# Patient Record
Sex: Female | Born: 1950 | Race: White | Hispanic: No | Smoking: Never smoker
Health system: Southern US, Community
[De-identification: ages and names within clinical notes are randomized; demographics above are authoritative.]

## PROBLEM LIST (undated history)

## (undated) DIAGNOSIS — E559 Vitamin D deficiency, unspecified: Secondary | ICD-10-CM

## (undated) DIAGNOSIS — M1990 Unspecified osteoarthritis, unspecified site: Secondary | ICD-10-CM

## (undated) DIAGNOSIS — IMO0002 Reserved for concepts with insufficient information to code with codable children: Secondary | ICD-10-CM

## (undated) DIAGNOSIS — Z91013 Allergy to seafood: Secondary | ICD-10-CM

## (undated) DIAGNOSIS — F32A Depression, unspecified: Secondary | ICD-10-CM

## (undated) DIAGNOSIS — M25551 Pain in right hip: Secondary | ICD-10-CM

## (undated) DIAGNOSIS — G479 Sleep disorder, unspecified: Secondary | ICD-10-CM

## (undated) DIAGNOSIS — I779 Disorder of arteries and arterioles, unspecified: Secondary | ICD-10-CM

## (undated) DIAGNOSIS — I358 Other nonrheumatic aortic valve disorders: Secondary | ICD-10-CM

## (undated) DIAGNOSIS — R9430 Abnormal result of cardiovascular function study, unspecified: Secondary | ICD-10-CM

## (undated) DIAGNOSIS — E538 Deficiency of other specified B group vitamins: Secondary | ICD-10-CM

## (undated) DIAGNOSIS — H269 Unspecified cataract: Secondary | ICD-10-CM

## (undated) DIAGNOSIS — T7840XA Allergy, unspecified, initial encounter: Secondary | ICD-10-CM

## (undated) DIAGNOSIS — Z96641 Presence of right artificial hip joint: Secondary | ICD-10-CM

## (undated) DIAGNOSIS — E785 Hyperlipidemia, unspecified: Secondary | ICD-10-CM

## (undated) DIAGNOSIS — I493 Ventricular premature depolarization: Secondary | ICD-10-CM

## (undated) DIAGNOSIS — K219 Gastro-esophageal reflux disease without esophagitis: Secondary | ICD-10-CM

## (undated) DIAGNOSIS — M549 Dorsalgia, unspecified: Secondary | ICD-10-CM

## (undated) DIAGNOSIS — I2510 Atherosclerotic heart disease of native coronary artery without angina pectoris: Secondary | ICD-10-CM

## (undated) DIAGNOSIS — M2550 Pain in unspecified joint: Secondary | ICD-10-CM

## (undated) DIAGNOSIS — I10 Essential (primary) hypertension: Secondary | ICD-10-CM

## (undated) DIAGNOSIS — R238 Other skin changes: Secondary | ICD-10-CM

## (undated) DIAGNOSIS — J439 Emphysema, unspecified: Secondary | ICD-10-CM

## (undated) DIAGNOSIS — R233 Spontaneous ecchymoses: Secondary | ICD-10-CM

## (undated) DIAGNOSIS — H9190 Unspecified hearing loss, unspecified ear: Secondary | ICD-10-CM

## (undated) DIAGNOSIS — I219 Acute myocardial infarction, unspecified: Secondary | ICD-10-CM

## (undated) DIAGNOSIS — Z5189 Encounter for other specified aftercare: Secondary | ICD-10-CM

## (undated) DIAGNOSIS — M8430XA Stress fracture, unspecified site, initial encounter for fracture: Secondary | ICD-10-CM

## (undated) DIAGNOSIS — R002 Palpitations: Secondary | ICD-10-CM

## (undated) DIAGNOSIS — R011 Cardiac murmur, unspecified: Secondary | ICD-10-CM

## (undated) DIAGNOSIS — K5900 Constipation, unspecified: Secondary | ICD-10-CM

## (undated) DIAGNOSIS — I739 Peripheral vascular disease, unspecified: Secondary | ICD-10-CM

## (undated) DIAGNOSIS — F329 Major depressive disorder, single episode, unspecified: Secondary | ICD-10-CM

## (undated) HISTORY — DX: Encounter for other specified aftercare: Z51.89

## (undated) HISTORY — DX: Palpitations: R00.2

## (undated) HISTORY — DX: Unspecified hearing loss, unspecified ear: H91.90

## (undated) HISTORY — DX: Hyperlipidemia, unspecified: E78.5

## (undated) HISTORY — DX: Constipation, unspecified: K59.00

## (undated) HISTORY — DX: Spontaneous ecchymoses: R23.3

## (undated) HISTORY — DX: Abnormal result of cardiovascular function study, unspecified: R94.30

## (undated) HISTORY — DX: Depression, unspecified: F32.A

## (undated) HISTORY — PX: SKIN CANCER EXCISION: SHX779

## (undated) HISTORY — DX: Deficiency of other specified B group vitamins: E53.8

## (undated) HISTORY — PX: OTHER SURGICAL HISTORY: SHX169

## (undated) HISTORY — DX: Allergy to seafood: Z91.013

## (undated) HISTORY — DX: Ventricular premature depolarization: I49.3

## (undated) HISTORY — PX: WISDOM TOOTH EXTRACTION: SHX21

## (undated) HISTORY — DX: Allergy, unspecified, initial encounter: T78.40XA

## (undated) HISTORY — DX: Dorsalgia, unspecified: M54.9

## (undated) HISTORY — DX: Sleep disorder, unspecified: G47.9

## (undated) HISTORY — DX: Unspecified cataract: H26.9

## (undated) HISTORY — DX: Disorder of arteries and arterioles, unspecified: I77.9

## (undated) HISTORY — DX: Presence of right artificial hip joint: Z96.641

## (undated) HISTORY — DX: Vitamin D deficiency, unspecified: E55.9

## (undated) HISTORY — DX: Reserved for concepts with insufficient information to code with codable children: IMO0002

## (undated) HISTORY — DX: Stress fracture, unspecified site, initial encounter for fracture: M84.30XA

## (undated) HISTORY — DX: Atherosclerotic heart disease of native coronary artery without angina pectoris: I25.10

## (undated) HISTORY — PX: CATARACT EXTRACTION, BILATERAL: SHX1313

## (undated) HISTORY — PX: BREAST BIOPSY: SHX20

## (undated) HISTORY — DX: Other skin changes: R23.8

## (undated) HISTORY — DX: Pain in right hip: M25.551

## (undated) HISTORY — DX: Other nonrheumatic aortic valve disorders: I35.8

## (undated) HISTORY — DX: Emphysema, unspecified: J43.9

## (undated) HISTORY — DX: Major depressive disorder, single episode, unspecified: F32.9

## (undated) HISTORY — PX: CARDIAC CATHETERIZATION: SHX172

## (undated) HISTORY — PX: DIAGNOSTIC LAPAROSCOPY: SUR761

## (undated) HISTORY — DX: Peripheral vascular disease, unspecified: I73.9

## (undated) HISTORY — DX: Pain in unspecified joint: M25.50

---

## 1997-04-13 ENCOUNTER — Ambulatory Visit (HOSPITAL_COMMUNITY): Admission: RE | Admit: 1997-04-13 | Discharge: 1997-04-13 | Payer: Self-pay | Admitting: Obstetrics and Gynecology

## 1998-06-01 ENCOUNTER — Other Ambulatory Visit: Admission: RE | Admit: 1998-06-01 | Discharge: 1998-06-01 | Payer: Self-pay | Admitting: Obstetrics and Gynecology

## 1999-02-17 ENCOUNTER — Ambulatory Visit (HOSPITAL_COMMUNITY): Admission: RE | Admit: 1999-02-17 | Discharge: 1999-02-17 | Payer: Self-pay | Admitting: Obstetrics and Gynecology

## 1999-02-17 ENCOUNTER — Encounter (INDEPENDENT_AMBULATORY_CARE_PROVIDER_SITE_OTHER): Payer: Self-pay | Admitting: *Deleted

## 1999-03-06 ENCOUNTER — Encounter: Payer: Self-pay | Admitting: Obstetrics and Gynecology

## 1999-03-06 ENCOUNTER — Ambulatory Visit (HOSPITAL_COMMUNITY): Admission: RE | Admit: 1999-03-06 | Discharge: 1999-03-06 | Payer: Self-pay | Admitting: Obstetrics and Gynecology

## 2000-01-08 ENCOUNTER — Other Ambulatory Visit: Admission: RE | Admit: 2000-01-08 | Discharge: 2000-01-08 | Payer: Self-pay | Admitting: Obstetrics and Gynecology

## 2000-10-31 ENCOUNTER — Encounter: Payer: Self-pay | Admitting: Obstetrics and Gynecology

## 2000-10-31 ENCOUNTER — Ambulatory Visit (HOSPITAL_COMMUNITY): Admission: RE | Admit: 2000-10-31 | Discharge: 2000-10-31 | Payer: Self-pay | Admitting: Obstetrics and Gynecology

## 2001-01-20 ENCOUNTER — Other Ambulatory Visit: Admission: RE | Admit: 2001-01-20 | Discharge: 2001-01-20 | Payer: Self-pay | Admitting: Obstetrics and Gynecology

## 2001-02-04 ENCOUNTER — Encounter: Payer: Self-pay | Admitting: Obstetrics and Gynecology

## 2001-02-04 ENCOUNTER — Ambulatory Visit (HOSPITAL_COMMUNITY): Admission: RE | Admit: 2001-02-04 | Discharge: 2001-02-04 | Payer: Self-pay | Admitting: Obstetrics and Gynecology

## 2001-03-03 ENCOUNTER — Encounter (INDEPENDENT_AMBULATORY_CARE_PROVIDER_SITE_OTHER): Payer: Self-pay

## 2001-03-03 ENCOUNTER — Ambulatory Visit (HOSPITAL_COMMUNITY): Admission: RE | Admit: 2001-03-03 | Discharge: 2001-03-03 | Payer: Self-pay | Admitting: Gastroenterology

## 2001-03-12 ENCOUNTER — Encounter: Payer: Self-pay | Admitting: Obstetrics and Gynecology

## 2001-03-12 ENCOUNTER — Ambulatory Visit (HOSPITAL_COMMUNITY): Admission: RE | Admit: 2001-03-12 | Discharge: 2001-03-12 | Payer: Self-pay | Admitting: Obstetrics and Gynecology

## 2002-05-24 ENCOUNTER — Other Ambulatory Visit: Admission: RE | Admit: 2002-05-24 | Discharge: 2002-05-24 | Payer: Self-pay | Admitting: Obstetrics and Gynecology

## 2002-05-26 ENCOUNTER — Encounter: Payer: Self-pay | Admitting: Obstetrics and Gynecology

## 2002-05-26 ENCOUNTER — Ambulatory Visit (HOSPITAL_COMMUNITY): Admission: RE | Admit: 2002-05-26 | Discharge: 2002-05-26 | Payer: Self-pay | Admitting: Obstetrics and Gynecology

## 2003-02-19 HISTORY — PX: DILATION AND CURETTAGE OF UTERUS: SHX78

## 2003-04-27 ENCOUNTER — Ambulatory Visit (HOSPITAL_COMMUNITY): Admission: RE | Admit: 2003-04-27 | Discharge: 2003-04-27 | Payer: Self-pay | Admitting: Obstetrics and Gynecology

## 2004-05-31 ENCOUNTER — Other Ambulatory Visit: Admission: RE | Admit: 2004-05-31 | Discharge: 2004-05-31 | Payer: Self-pay | Admitting: Obstetrics and Gynecology

## 2004-08-30 ENCOUNTER — Ambulatory Visit (HOSPITAL_COMMUNITY): Admission: RE | Admit: 2004-08-30 | Discharge: 2004-08-30 | Payer: Self-pay | Admitting: Gastroenterology

## 2005-07-16 ENCOUNTER — Ambulatory Visit (HOSPITAL_COMMUNITY): Admission: RE | Admit: 2005-07-16 | Discharge: 2005-07-16 | Payer: Self-pay | Admitting: Obstetrics and Gynecology

## 2005-08-01 ENCOUNTER — Encounter (INDEPENDENT_AMBULATORY_CARE_PROVIDER_SITE_OTHER): Payer: Self-pay | Admitting: Specialist

## 2005-08-01 ENCOUNTER — Encounter: Admission: RE | Admit: 2005-08-01 | Discharge: 2005-08-01 | Payer: Self-pay | Admitting: Obstetrics and Gynecology

## 2005-08-01 HISTORY — PX: BREAST BIOPSY: SHX20

## 2005-09-12 ENCOUNTER — Other Ambulatory Visit: Admission: RE | Admit: 2005-09-12 | Discharge: 2005-09-12 | Payer: Self-pay | Admitting: Obstetrics and Gynecology

## 2005-12-27 ENCOUNTER — Ambulatory Visit: Payer: Self-pay | Admitting: Cardiology

## 2006-01-27 ENCOUNTER — Encounter: Admission: RE | Admit: 2006-01-27 | Discharge: 2006-01-27 | Payer: Self-pay | Admitting: Obstetrics and Gynecology

## 2006-01-29 ENCOUNTER — Ambulatory Visit: Payer: Self-pay

## 2006-01-29 ENCOUNTER — Encounter: Payer: Self-pay | Admitting: Internal Medicine

## 2006-02-04 ENCOUNTER — Ambulatory Visit: Payer: Self-pay | Admitting: Cardiology

## 2006-07-22 ENCOUNTER — Encounter: Admission: RE | Admit: 2006-07-22 | Discharge: 2006-07-22 | Payer: Self-pay | Admitting: Obstetrics and Gynecology

## 2007-08-25 ENCOUNTER — Ambulatory Visit (HOSPITAL_COMMUNITY): Admission: RE | Admit: 2007-08-25 | Discharge: 2007-08-25 | Payer: Self-pay | Admitting: Obstetrics and Gynecology

## 2007-11-19 HISTORY — PX: CORONARY ANGIOPLASTY WITH STENT PLACEMENT: SHX49

## 2007-11-28 ENCOUNTER — Inpatient Hospital Stay (HOSPITAL_COMMUNITY): Admission: EM | Admit: 2007-11-28 | Discharge: 2007-12-02 | Payer: Self-pay | Admitting: Emergency Medicine

## 2007-11-28 ENCOUNTER — Ambulatory Visit: Payer: Self-pay | Admitting: Internal Medicine

## 2007-12-17 ENCOUNTER — Ambulatory Visit: Payer: Self-pay | Admitting: Cardiology

## 2007-12-17 ENCOUNTER — Encounter (HOSPITAL_COMMUNITY): Admission: RE | Admit: 2007-12-17 | Discharge: 2008-02-17 | Payer: Self-pay | Admitting: Cardiology

## 2007-12-17 LAB — CONVERTED CEMR LAB
BUN: 25 mg/dL — ABNORMAL HIGH (ref 6–23)
CO2: 29 meq/L (ref 19–32)
Glucose, Bld: 92 mg/dL (ref 70–99)
Potassium: 4 meq/L (ref 3.5–5.1)
Sodium: 142 meq/L (ref 135–145)

## 2007-12-31 ENCOUNTER — Ambulatory Visit: Payer: Self-pay

## 2007-12-31 ENCOUNTER — Ambulatory Visit: Payer: Self-pay | Admitting: Cardiology

## 2008-01-07 ENCOUNTER — Ambulatory Visit: Payer: Self-pay | Admitting: Cardiology

## 2008-01-28 ENCOUNTER — Ambulatory Visit: Payer: Self-pay | Admitting: Cardiology

## 2008-02-19 ENCOUNTER — Encounter (HOSPITAL_COMMUNITY): Admission: RE | Admit: 2008-02-19 | Discharge: 2008-04-18 | Payer: Self-pay | Admitting: Cardiology

## 2008-05-06 ENCOUNTER — Encounter: Payer: Self-pay | Admitting: Cardiology

## 2008-05-06 ENCOUNTER — Ambulatory Visit: Payer: Self-pay | Admitting: Cardiology

## 2008-06-13 ENCOUNTER — Ambulatory Visit: Payer: Self-pay | Admitting: Cardiology

## 2008-06-13 LAB — CONVERTED CEMR LAB
ALT: 17 units/L (ref 0–35)
Bilirubin, Direct: 0 mg/dL (ref 0.0–0.3)
Total Bilirubin: 0.7 mg/dL (ref 0.3–1.2)
Total CHOL/HDL Ratio: 3
Triglycerides: 110 mg/dL (ref 0.0–149.0)
VLDL: 22 mg/dL (ref 0.0–40.0)

## 2008-06-27 ENCOUNTER — Encounter (INDEPENDENT_AMBULATORY_CARE_PROVIDER_SITE_OTHER): Payer: Self-pay | Admitting: *Deleted

## 2008-07-20 ENCOUNTER — Telehealth: Payer: Self-pay | Admitting: Cardiology

## 2008-08-25 ENCOUNTER — Ambulatory Visit (HOSPITAL_COMMUNITY): Admission: RE | Admit: 2008-08-25 | Discharge: 2008-08-25 | Payer: Self-pay | Admitting: Obstetrics and Gynecology

## 2008-09-28 ENCOUNTER — Telehealth: Payer: Self-pay | Admitting: Cardiology

## 2008-10-11 ENCOUNTER — Encounter (INDEPENDENT_AMBULATORY_CARE_PROVIDER_SITE_OTHER): Payer: Self-pay | Admitting: *Deleted

## 2008-12-11 ENCOUNTER — Encounter: Payer: Self-pay | Admitting: Cardiology

## 2008-12-12 ENCOUNTER — Ambulatory Visit: Payer: Self-pay | Admitting: Cardiology

## 2008-12-27 ENCOUNTER — Ambulatory Visit (HOSPITAL_COMMUNITY): Admission: RE | Admit: 2008-12-27 | Discharge: 2008-12-27 | Payer: Self-pay | Admitting: Cardiology

## 2008-12-27 ENCOUNTER — Ambulatory Visit: Payer: Self-pay

## 2008-12-27 ENCOUNTER — Ambulatory Visit: Payer: Self-pay | Admitting: Cardiology

## 2008-12-27 ENCOUNTER — Encounter: Payer: Self-pay | Admitting: Cardiology

## 2009-01-30 ENCOUNTER — Ambulatory Visit: Payer: Self-pay | Admitting: Cardiology

## 2009-01-31 ENCOUNTER — Telehealth: Payer: Self-pay | Admitting: Cardiology

## 2009-02-02 ENCOUNTER — Encounter: Payer: Self-pay | Admitting: Cardiology

## 2009-02-03 ENCOUNTER — Ambulatory Visit: Payer: Self-pay | Admitting: Cardiology

## 2009-02-14 ENCOUNTER — Ambulatory Visit: Payer: Self-pay

## 2009-02-14 ENCOUNTER — Encounter: Payer: Self-pay | Admitting: Cardiology

## 2009-02-14 ENCOUNTER — Ambulatory Visit: Payer: Self-pay | Admitting: Cardiovascular Disease

## 2009-02-14 ENCOUNTER — Ambulatory Visit (HOSPITAL_COMMUNITY): Admission: RE | Admit: 2009-02-14 | Discharge: 2009-02-14 | Payer: Self-pay | Admitting: Cardiology

## 2009-02-19 LAB — CONVERTED CEMR LAB
ALT: 19 units/L (ref 0–35)
AST: 21 units/L (ref 0–37)
Albumin: 3.9 g/dL (ref 3.5–5.2)
Alkaline Phosphatase: 59 units/L (ref 39–117)
HDL: 71.7 mg/dL (ref >39.00)
LDL Cholesterol: 110 mg/dL — ABNORMAL HIGH (ref 0–99)
Total Bilirubin: 0.8 mg/dL (ref 0.3–1.2)
Total Protein: 6.8 g/dL (ref 6.0–8.3)
VLDL: 16.8 mg/dL (ref 0.0–40.0)

## 2009-03-10 ENCOUNTER — Ambulatory Visit: Payer: Self-pay | Admitting: Cardiology

## 2009-03-10 LAB — CONVERTED CEMR LAB
BUN: 21 mg/dL (ref 6–23)
Basophils Absolute: 0 10*3/uL (ref 0.0–0.1)
Eosinophils Relative: 2.4 % (ref 0.0–5.0)
Glucose, Bld: 87 mg/dL (ref 70–99)
HCT: 38.3 % (ref 36.0–46.0)
Hemoglobin: 12.8 g/dL (ref 12.0–15.0)
Lymphocytes Relative: 36.6 % (ref 12.0–46.0)
MCHC: 33.4 g/dL (ref 30.0–36.0)
MCV: 95.5 fL (ref 78.0–100.0)
Monocytes Relative: 6.6 % (ref 3.0–12.0)
Neutro Abs: 3.6 10*3/uL (ref 1.4–7.7)
Neutrophils Relative %: 53.8 % (ref 43.0–77.0)
RDW: 12.2 % (ref 11.5–14.6)
WBC: 6.7 10*3/uL (ref 4.5–10.5)

## 2009-03-17 ENCOUNTER — Inpatient Hospital Stay (HOSPITAL_BASED_OUTPATIENT_CLINIC_OR_DEPARTMENT_OTHER): Admission: RE | Admit: 2009-03-17 | Discharge: 2009-03-17 | Payer: Self-pay | Admitting: Cardiovascular Disease

## 2009-03-17 ENCOUNTER — Ambulatory Visit: Payer: Self-pay | Admitting: Cardiovascular Disease

## 2009-04-05 ENCOUNTER — Encounter: Payer: Self-pay | Admitting: Cardiology

## 2009-04-06 ENCOUNTER — Ambulatory Visit: Payer: Self-pay | Admitting: Cardiology

## 2009-04-06 DIAGNOSIS — I6529 Occlusion and stenosis of unspecified carotid artery: Secondary | ICD-10-CM | POA: Insufficient documentation

## 2009-05-17 ENCOUNTER — Ambulatory Visit: Payer: Self-pay | Admitting: Cardiology

## 2009-05-17 ENCOUNTER — Telehealth (INDEPENDENT_AMBULATORY_CARE_PROVIDER_SITE_OTHER): Payer: Self-pay | Admitting: *Deleted

## 2009-05-18 ENCOUNTER — Encounter: Payer: Self-pay | Admitting: Cardiology

## 2009-05-18 LAB — CONVERTED CEMR LAB
ALT: 21 units/L (ref 0–35)
AST: 20 units/L (ref 0–37)
Alkaline Phosphatase: 53 units/L (ref 39–117)
Bilirubin, Direct: 0.1 mg/dL (ref 0.0–0.3)
Cholesterol: 163 mg/dL (ref 0–200)
HDL: 66.5 mg/dL (ref >39.00)
LDL Cholesterol: 76 mg/dL (ref 0–99)
Total Bilirubin: 0.5 mg/dL (ref 0.3–1.2)
Triglycerides: 101 mg/dL (ref 0.0–149.0)
VLDL: 20.2 mg/dL (ref 0.0–40.0)

## 2009-05-24 ENCOUNTER — Telehealth: Payer: Self-pay | Admitting: Cardiology

## 2009-07-13 ENCOUNTER — Ambulatory Visit: Payer: Self-pay | Admitting: Cardiology

## 2009-10-27 ENCOUNTER — Encounter: Payer: Self-pay | Admitting: Cardiology

## 2009-11-14 ENCOUNTER — Ambulatory Visit (HOSPITAL_COMMUNITY): Admission: RE | Admit: 2009-11-14 | Discharge: 2009-11-14 | Payer: Self-pay | Admitting: Obstetrics and Gynecology

## 2009-12-05 ENCOUNTER — Ambulatory Visit: Payer: Self-pay | Admitting: Cardiology

## 2009-12-15 ENCOUNTER — Encounter: Payer: Self-pay | Admitting: Cardiology

## 2009-12-15 LAB — CONVERTED CEMR LAB
AST: 22 units/L (ref 0–37)
Alkaline Phosphatase: 65 units/L (ref 39–117)
Bilirubin, Direct: 0 mg/dL (ref 0.0–0.3)
Cholesterol: 177 mg/dL (ref 0–200)
HDL: 64.5 mg/dL (ref >39.00)
Total Protein: 6.5 g/dL (ref 6.0–8.3)

## 2009-12-19 ENCOUNTER — Telehealth: Payer: Self-pay | Admitting: Cardiology

## 2009-12-19 DIAGNOSIS — M25551 Pain in right hip: Secondary | ICD-10-CM

## 2009-12-19 HISTORY — DX: Pain in right hip: M25.551

## 2009-12-25 ENCOUNTER — Ambulatory Visit: Payer: Self-pay | Admitting: Cardiology

## 2009-12-27 ENCOUNTER — Ambulatory Visit (HOSPITAL_COMMUNITY): Admission: RE | Admit: 2009-12-27 | Discharge: 2009-12-27 | Payer: Self-pay | Admitting: Orthopedic Surgery

## 2010-02-22 ENCOUNTER — Ambulatory Visit
Admission: RE | Admit: 2010-02-22 | Discharge: 2010-02-22 | Payer: Self-pay | Source: Home / Self Care | Attending: Cardiology | Admitting: Cardiology

## 2010-03-10 ENCOUNTER — Encounter: Payer: Self-pay | Admitting: Orthopedic Surgery

## 2010-03-20 NOTE — Letter (Signed)
Summary: Lipid reminder  [facility], Main Office  [address]   [place], [place] [address]   Phone: [phone]  Fax: [phone]        October 27, 2009 MRN: [id number]    Courtney [name] [address] [place], [place]  [address]    Dear Ms. Golson,  Our records indicate it is time to check your cholesterol.  Please call our office to schedule an appt for labwork.  Please remember it is a fasting lab.     Sincerely,  Meredith Staggers, RN Willa Rough, MD  This letter has been electronically signed by your physician.

## 2010-03-20 NOTE — Progress Notes (Signed)
Summary: pt would like lab results  Phone Note Call from Patient Call back at Home Phone [phone]   Caller: Patient Reason for Call: Talk to Nurse, Talk to Doctor, Lab or Test Results Summary of Call: pt would like her lab results from 3/30 Initial call taken by: Omer Jack,  May 24, 2009 1:04 PM  Follow-up for Phone Call        Phone Call Completed.  RN s/w Pt re: lipid results; advised Heather mailed her results and has not yet received it, but to call back if she has not received in about 1 week. Pt verbalizes understanding.   Follow-up by: Bernita Raisin, RN, BSN,  May 24, 2009 1:31 PM

## 2010-03-20 NOTE — Progress Notes (Signed)
Summary: Question about Metoprolol 25mg   Phone Note Call from Patient Call back at Home Phone [phone] Message from:  Patient on May 17, 2009 3:42 PM  Refills Requested: Medication #1:  METOPROLOL TARTRATE 25 MG TABS Take one-half tablet by mouth twice a day Caller: Patient Summary of Call: Pt  have question about Metoprolol 25 mg pt said that Dr.Nishan changes the prescription  Initial call taken by: Judie Grieve,  May 17, 2009 3:43 PM  Follow-up for Phone Call        gave pt refills this is a Dr. Myrtis Ser pt also tried to call pt back didnot answer seems like the number i got was a office number so i did not leave message Follow-up by: Kem Parkinson,  May 17, 2009 4:04 PM    New/Updated Medications: METOPROLOL TARTRATE 25 MG TABS (METOPROLOL TARTRATE) 1 tab by mouth two times a day Prescriptions: METOPROLOL TARTRATE 25 MG TABS (METOPROLOL TARTRATE) 1 tab by mouth two times a day  #60 x 12   Entered by:   Kem Parkinson   Authorized by:   Talitha Givens, MD, [facility]   Signed by:   Kem Parkinson on 05/17/2009   Method used:   Electronically to        [facility]* (retail)       [address]       [address] Shipping/mailing       [place], [place]  [address]       Ph: [phone]       Fax: [phone]   RxID:   [id number]

## 2010-03-20 NOTE — Progress Notes (Signed)
Summary: re pain relievers   Phone Note Call from Patient   Caller: Patient [phone] Reason for Call: Talk to Nurse Summary of Call: pt hurt her hip, what pain reliver can she take Initial call taken by: Glynda Jaeger,  December 19, 2009 11:22 AM  Follow-up for Phone Call        Left message to call back Meredith Staggers, RN  December 19, 2009 4:26 PM   Left message to call back Meredith Staggers, RN  December 20, 2009 4:03 PM   pt returning call [phone] sign  Left message to call back Meredith Staggers, RN  December 21, 2009 5:14 PM   Additional Follow-up for Phone Call Additional follow up Details #1::        pt had appt w/Dr Myrtis Ser 11/7 Meredith Staggers, RN  December 25, 2009 2:54 PM

## 2010-03-20 NOTE — Letter (Signed)
Summary: Custom - Lipid  [facility], Main Office  [address]   [place], [place] [address]   Phone: [phone]  Fax: [phone]     December 15, 2009 MRN: [id number]   Courtney [name] [address] [place], [place]  [address]   Dear Ms. Kuzel,  We have reviewed your cholesterol results.  They are as follows:     Total Cholesterol:    177 (Desirable: less than 200)       HDL  Cholesterol:     64.50  (Desirable: greater than 40 for men and 50 for women)       LDL Cholesterol:       98  (Desirable: less than 100 for low risk and less than 70 for moderate to high risk)       Triglycerides:       73.0  (Desirable: less than 150)  Our recommendations include:  LDL up slightly, continue current meds, Dr Myrtis Ser will discuss with you at your appointment on 12/25/09   Call our office at the number listed above if you have any questions.  Lowering your LDL cholesterol is important, but it is only one of a large number of "risk factors" that may indicate that you are at risk for heart disease, stroke or other complications of hardening of the arteries.  Other risk factors include:   A.  Cigarette Smoking* B.  High Blood Pressure* C.  Obesity* D.   Low HDL Cholesterol (see yours above)* E.   Diabetes Mellitus (higher risk if your is uncontrolled) F.  Family history of premature heart disease G.  Previous history of stroke or cardiovascular disease    *These are risk factors YOU HAVE CONTROL OVER.  For more information, visit .  There is now evidence that lowering the TOTAL CHOLESTEROL AND LDL CHOLESTEROL can reduce the risk of heart disease.  The American Heart Association recommends the following guidelines for the treatment of elevated cholesterol:  1.  If there is now current heart disease and less than two risk factors, TOTAL CHOLESTEROL should be less than 200 and LDL CHOLESTEROL should be less than 100. 2.  If there is current heart disease or two or more  risk factors, TOTAL CHOLESTEROL should be less than 200 and LDL CHOLESTEROL should be less than 70.  A diet low in cholesterol, saturated fat, and calories is the cornerstone of treatment for elevated cholesterol.  Cessation of smoking and exercise are also important in the management of elevated cholesterol and preventing vascular disease.  Studies have shown that 30 to 60 minutes of physical activity most days can help lower blood pressure, lower cholesterol, and keep your weight at a healthy level.  Drug therapy is used when cholesterol levels do not respond to therapeutic lifestyle changes (smoking cessation, diet, and exercise) and remains unacceptably high.  If medication is started, it is important to have you levels checked periodically to evaluate the need for further treatment options.  Thank you,    [facility] Team

## 2010-03-20 NOTE — Assessment & Plan Note (Signed)
Summary: per check out/sf   Visit Type:  Follow-up Primary Provider:  [name], MD  CC:  CAD.  History of Present Illness: The patient is seen for followup of coronary artery disease.  I saw her last February 03, 2009.  She had some palpitations.  She was not having the indigestion that she had had originally with her acute ischemic syndrome.  Stress echo was done to be sure that her PVCs were not a sign of stress induced ectopy.  Study was done February 14, 2009.  Patient exercised to a peak heart rate of 150.  She did not have chest pain.  There was 1 mm of ST depression with stress..  Ejection fraction at rest was 55% with no wall motion abnormalities.  With stress there was some inferior hypokinesis.  I reviewed this data and certainly agree.  This is a subtle finding but it appears to be real.  She returns today for followup.  I also had heard a carotid bruit and Dopplers were ordered.  This study shows that there has been some progression with 40-59% bilateral stenoses.  Current Medications (verified): 1)  Aspirin Ec 325 Mg Tbec (Aspirin) .... Take One Tablet By Mouth Daily 2)  Metoprolol Tartrate 25 Mg Tabs (Metoprolol Tartrate) .... Take One-Half Tablet By Mouth Twice A Day 3)  Zocor 80 Mg Tabs (Simvastatin) .... 1/2 Tab At Bedtime 4)  Nitroglycerin 0.4 Mg Subl (Nitroglycerin) .... One Tablet Under Tongue Every 5 Minutes As Needed For Chest Pain---May Repeat Times Three 5)  Prometrium 100 Mg Caps (Progesterone Micronized) .... Once Daily 6)  Climara 0.05 Mg/24hr Ptwk (Estradiol) .... Q Week 7)  Acetaminophen 500 Mg Tabs (Acetaminophen) .... 2 Prn 8)  Ramipril 5 Mg Caps (Ramipril) .[name].. 1 Tab Once Daily 9)  Vitamin D 1000 Unit  Tabs (Cholecalciferol) .... Once Daily  Allergies (verified): 1)  ! Penicillin 2)  ! Erythromycin  Past History:  Past Medical History: Last updated: 02/03/2009  Allergy to PENICILLIN, E-MYCIN, and SHELLFISH. PVCs.... no significant  arrhythmias EF..  45%.  /  echo..12/27/2008.Marland KitchenMarland KitchenEF  55-60% Aortic valve sclerosis....echo...12/2008....soft systolic murmur. volume overload.... mild.... none since hospital in the past dyslipidemia CAD.......PCI.mid right coronary bare-metal stent.Marland KitchenMarland KitchenOctober, 2009.Marland Kitchenaspirin Plavix for one year...EF 45% then..improved 2010        Review of Systems       Patient denies fever, chills, headache, sweats, rash, change in vision, change in hearing, chest pain, cough, nausea vomiting, urinary symptoms, musculoskeletal problems.  All other systems are reviewed and are negative.  Vital Signs:  Patient profile:   60 year old female Height:      60 inches Weight:      156 pounds BMI:     30.58 Pulse rate:   60 / minute BP sitting:   126 / 78  (left arm) Cuff size:   regular  Vitals Entered By: Hardin Negus, RMA (March 10, 2009 8:43 AM)  Physical Exam  General:  patient is stable today. Head:  head is atraumatic. Eyes:  no xanthelasma. Neck:  soft carotid bruits. Chest Wall:  no chest wall tenderness. Lungs:  lungs are clear.  Respiratory effort is nonlabored. Heart:  cardiac exam reveals muscle and S2.  There are no clicks.  There is a very soft systolic murmur. Abdomen:  abdomen is soft. Msk:  no musculoskeletal deformities. Extremities:  no peripheral edema. Skin:  no skin rashes. Psych:  patient is oriented to person time and place.  Affect is normal.  Impression & Recommendations:  Problem # 1:  CAROTID BRUIT (ICD-785.9) The patient has had followup carotid Dopplers.  There is some progression and she is being treated aggressively.  She does not need an intervention.  Problem # 2:  CAD (ICD-414.00)  The stress echo reveals 1 mm of ST depression and some stress-induced inferior ischemia.  She and I discussed this carefully and we will proceed with outpatient followup cardiac catheterization.  She understands the risks and benefits.  As we done by Dr.Cooper who did her  original catheterization.  Orders: TLB-BMP (Basic Metabolic Panel-BMET) (80048-METABOL) TLB-CBC Platelet - w/Differential (85025-CBCD) TLB-PT (Protime) (85610-PTP) Cardiac Catheterization (Cardiac Cath)  Problem # 3:  FLUID OVERLOAD (ICD-276.6) Fluid status is stable.  No change in therapy.  Problem # 4:  AORTIC VALVE SCLEROSIS (ICD-424.1) The patient soft murmur is from mild aortic valve sclerosis.  The stress echo images do not suggest any major change.  Problem # 5:  * EJECTION FRACTION 45% ejection fraction at rest is normal.  Problem # 6:  PREMATURE VENTRICULAR CONTRACTIONS (ICD-427.69) The patient did not have any marked ectopy with her exercise test.  Problem # 7:  DYSLIPIDEMIA (ICD-272.4)  Her updated medication list for this problem includes:    Zocor 80 Mg Tabs (Simvastatin) .[name]... 1/2 tab at bedtime Lipids are being treated.  We'll plan followup labs later.  Patient Instructions: 1)  Labs today 2)  Your physician has requested that you have a cardiac catheterization.  Cardiac catheterization is used to diagnose and/or treat various heart conditions. Doctors may recommend this procedure for a number of different reasons. The most common reason is to evaluate chest pain. Chest pain can be a symptom of coronary artery disease (CAD), and cardiac catheterization can show whether plaque is narrowing or blocking your heart's arteries. This procedure is also used to evaluate the valves, as well as measure the blood flow and oxygen levels in different parts of your heart.  For further information please visit [email or web address]  Please follow instruction sheet, as given. 3)  Follow up in 4-5 weeks

## 2010-03-20 NOTE — Letter (Signed)
Summary: Custom - Lipid  [facility], Main Office  [address]   [place], [place] [address]   Phone: [phone]  Fax: [phone]     May 18, 2009 MRN: [id number]   Courtney [name] [address] [place], [place]  [address]   Dear Courtney [name],  We have reviewed your cholesterol results.  They are as follows:     Total Cholesterol:    163 (Desirable: less than 200)       HDL  Cholesterol:     66.50  (Desirable: greater than 40 for men and 50 for women)       LDL Cholesterol:       76  (Desirable: less than 100 for low risk and less than 70 for moderate to high risk)       Triglycerides:       101.0  (Desirable: less than 150)  Our recommendations include:  Looks Good, continue current medications   Call our office at the number listed above if you have any questions.  Lowering your LDL cholesterol is important, but it is only one of a large number of "risk factors" that may indicate that you are at risk for heart disease, stroke or other complications of hardening of the arteries.  Other risk factors include:   A.  Cigarette Smoking* B.  High Blood Pressure* C.  Obesity* D.   Low HDL Cholesterol (see yours above)* E.   Diabetes Mellitus (higher risk if your is uncontrolled) F.  Family history of premature heart disease G.  Previous history of stroke or cardiovascular disease    *These are risk factors YOU HAVE CONTROL OVER.  For more information, visit .  There is now evidence that lowering the TOTAL CHOLESTEROL AND LDL CHOLESTEROL can reduce the risk of heart disease.  The American Heart Association recommends the following guidelines for the treatment of elevated cholesterol:  1.  If there is now current heart disease and less than two risk factors, TOTAL CHOLESTEROL should be less than 200 and LDL CHOLESTEROL should be less than 100. 2.  If there is current heart disease or two or more risk factors, TOTAL CHOLESTEROL should be less than 200 and LDL  CHOLESTEROL should be less than 70.  A diet low in cholesterol, saturated fat, and calories is the cornerstone of treatment for elevated cholesterol.  Cessation of smoking and exercise are also important in the management of elevated cholesterol and preventing vascular disease.  Studies have shown that 30 to 60 minutes of physical activity most days can help lower blood pressure, lower cholesterol, and keep your weight at a healthy level.  Drug therapy is used when cholesterol levels do not respond to therapeutic lifestyle changes (smoking cessation, diet, and exercise) and remains unacceptably high.  If medication is started, it is important to have you levels checked periodically to evaluate the need for further treatment options.  Thank you,    [facility] [employer] Team

## 2010-03-20 NOTE — Assessment & Plan Note (Signed)
Summary: 4-5 week f/u cath   Visit Type:  Follow-up Primary Provider:  [name], MD  CC:  CAD.  History of Present Illness: Patient is seen for follow up of coronary disease.  When I saw her last she had some indigestion and reflux this might be her anginal equivalent.  She underwent exercise testing.  She had a stress echo.  She had 1 mm ST depression.  Ejection fraction at rest was 55%.  With stress there was subtle inferior hypokinesis.  Based on this catheterization was done.  Study was done by Dr. Excell Seltzer.  Her right coronary artery stent was patent.  She does have severe but stable stenosis of the distal posterior lateral branch of the right coronary artery.  This supplies a relatively small amount of muscle.  Intervention could be done but it was recommended that we follow medical therapy first.  The patient has continued to be active with dancing.  She is not having exertionally related indigestion.  Current Medications (verified): 1)  Aspirin Ec 325 Mg Tbec (Aspirin) .... Take One Tablet By Mouth Daily 2)  Metoprolol Tartrate 25 Mg Tabs (Metoprolol Tartrate) .... Take One-Half Tablet By Mouth Twice A Day 3)  Zocor 80 Mg Tabs (Simvastatin) .... 1/2 Tab At Bedtime 4)  Nitroglycerin 0.4 Mg Subl (Nitroglycerin) .... One Tablet Under Tongue Every 5 Minutes As Needed For Chest Pain---May Repeat Times Three 5)  Prometrium 100 Mg Caps (Progesterone Micronized) .... Once Daily 6)  Climara 0.05 Mg/24hr Ptwk (Estradiol) .... Q Week 7)  Acetaminophen 500 Mg Tabs (Acetaminophen) .... 2 Prn 8)  Ramipril 5 Mg Caps (Ramipril) .[name].. 1 Tab Once Daily 9)  Vitamin D 1000 Unit  Tabs (Cholecalciferol) .... Once Daily 10)  Co Q-10 30 Mg  Caps (Coenzyme Q10) .... 100mg  Once Daily 11)  Fish Oil   Oil (Fish Oil) .... 1000mg  Once Daily 12)  Calcium Carbonate-Vitamin D 600-400 Mg-Unit  Tabs (Calcium Carbonate-Vitamin D) .... 5 Once Daily With Magnesium 13)  Supreme Dophilus .Marland KitchenMarland Kitchen. 1 Cap Once  Daily  Allergies (verified): 1)  ! Penicillin 2)  ! Erythromycin  Past History:  Past Medical History: carotid artery disease...40-59% bilateral... Doppler December, 2010 Allergy to PENICILLIN, E-MYCIN, and SHELLFISH. PVCs.... no significant arrhythmias EF..  45%.  /  echo..12/27/2008.Marland KitchenMarland KitchenEF  55-60% Aortic valve sclerosis....echo...12/2008....soft systolic murmur. volume overload.... mild.... none since hospital in the past dyslipidemia CAD.......PCI.mid right coronary bare-metal stent.Marland KitchenMarland KitchenOctober, 2009  /  stress echo.. January, 2011... subtle inferior hypokinesis with stress  /    catheterization March 17, 2009... RCA stent patent... severe, but stable stenosis of the distal posterior lateral branch RCA...LV normal... medical therapy unless more symptoms        Review of Systems       Patient denies fever, chills, headache, sweats, rash, change in vision, change in hearing, chest pain, cough, nausea or vomiting, urinary symptoms.  All other systems are reviewed and are negative.  Vital Signs:  Patient profile:   60 year old female Height:      60 inches Weight:      156 pounds BMI:     30.58 Pulse rate:   75 / minute BP sitting:   118 / 64  (left arm) Cuff size:   regular  Vitals Entered By: Hardin Negus, RMA (April 06, 2009 8:46 AM)  Physical Exam  General:  patient is stable in general. Head:  head is atraumatic. Eyes:  no xanthelasma. Neck:  no jugular venous distention. Chest Wall:  no chest wall tenderness. Lungs:  lungs are clear respiratory effort is not labored. Heart:  cardiac exam reveals S1-S2.  No clicks or significant murmurs. Abdomen:  abdomen is soft. Msk:  no musculoskeletal deformities. Extremities:  no peripheral edema. Skin:  no skin rashes. Psych:  patient is oriented to person time and place.  Affect is normal.   Impression & Recommendations:  Problem # 1:  OVERWEIGHT (ICD-278.02) I had a careful discussion with the patient about her  weight.  We both agreed it would be good for her to get back to [employer] to lose weight.  Problem # 2:  CAROTID ARTERY DISEASE (ICD-433.10)  Her updated medication list for this problem includes:    Aspirin Ec 325 Mg Tbec (Aspirin) .[name]... Take one tablet by mouth daily The patient has moderate carotid artery disease.  There may have been some progression in the past year.  We need to be much more aggressive with her lipid treatment.  Problem # 3:  CAD (ICD-414.00) See discussion in the history of present illness.  We will increase her Lopressor from 12.5 b.i.d. to 25 b.i.d.  Initially a small dose had been used because she had some bradycardia when in the hospital.  Heart rate today is 75.  The patient did have some inferior ischemia with her stress echo.  Beta blocker dose will be increased.  If we have any difficulties with hypotension, retrocrural dose will be decreased.  I'll plan to see her back in 3 months.  The plan is for medical therapy.  However, because she had some proven ischemia with her stress echo, I will have a low threshold for recommending interventional.  Problem # 4:  DYSLIPIDEMIA (ICD-272.4)  Her updated medication list for this problem includes:    Crestor 20 Mg Tabs (Rosuvastatin calcium) .[name]... Take one tablet by mouth daily. The patient's LDL has been in the 100 range.  We need to change her now to a more affective agent and she'll be changed to Crestor 20 mg.  If she tolerates this and her labs are stable I would probably consider pushing her 40 mg  Patient Instructions: 1)  Your physician recommends that you return for a FASTING lipid and liver profile in 6 weeks 2)  Follow up in 3 months Prescriptions: RAMIPRIL 5 MG CAPS (RAMIPRIL) 1 tab once daily  #90 x 3   Entered by:   Meredith Staggers, RN   Authorized by:   Talitha Givens, MD, [facility]   Signed by:   Meredith Staggers, RN on 04/06/2009   Method used:   Electronically to        [facility]*  (retail)       [address]       [address] Shipping/mailing       [place], [place]  [address]       Ph: [phone]       Fax: [phone]   RxID:   [id number] CRESTOR 20 MG TABS (ROSUVASTATIN CALCIUM) Take one tablet by mouth daily.  #90 x 3   Entered by:   Meredith Staggers, RN   Authorized by:   Talitha Givens, MD, [facility]   Signed by:   Meredith Staggers, RN on 04/06/2009   Method used:   Electronically to        [facility]* (retail)       [address]       [address]  Shipping/mailing       [place], [place]  [address]       Ph: [phone]       Fax: [phone]   RxID:   [id number]

## 2010-03-20 NOTE — Letter (Signed)
Summary: Cardiac Catheterization Instructions- [facility]  [facility], Main Office  [address]   [place], [place] [address]   Phone: [phone]  Fax: [phone]     03/10/2009 MRN: [id number]  [facility] [address] [place], [place]  [address]  Dear [name],   You are scheduled for a Cardiac Catheterization on Fri 1/28 with Courtney [name]  Please arrive to the 1st floor of the [facility] at [facility] at 8:30 am / pm on the day of your procedure. Please do not arrive before 6:30 a.m. Call the Heart and Vascular Center at [phone] if you are unable to make your appointmnet. The Code to get into the parking garage under the building is 0090. Take the elevators to the 1st floor. You must have someone to drive you home. Someone must be with you for the first 24 hours after you arrive home. Please wear clothes that are easy to get on and off and wear slip-on shoes. Do not eat or drink after midnight except water with your medications that morning. Bring all your medications and current insurance cards with you.  ___ DO NOT take these medications before your procedure: ________________________________________________________________  ___ Make sure you take your aspirin.  _X__ You may take ALL of your medications with water that morning. ________________________________________________________________________________________________________________________________  ___ DO NOT take ANY medications before your procedure.  ___ Pre-med instructions:  ________________________________________________________________________________________________________________________________  The usual length of stay after your procedure is 2 to 3 hours. This can vary.  If you have any questions, please call the office at the number listed above.   Meredith Staggers, RN

## 2010-03-20 NOTE — Cardiovascular Report (Signed)
Summary: Pre Cath Orders  Pre Cath Orders   Imported By: Roderic Ovens 03/10/2009 15:44:14  _____________________________________________________________________  External Attachment:    Type:   Image     Comment:   External Document

## 2010-03-20 NOTE — Assessment & Plan Note (Signed)
Summary: Courtney [name]   Visit Type:  Follow-up Primary Provider:  [name], MD  CC:  CAD.  History of Present Illness: The patient is seen today for follow up of coronary disease.  She has been relatively stable.  She did recently take a small hike.  This is not her usual type of exercise.  When walking up a hill she had some shortness of breath.  She did not have chest pain.  Unfortunately she is bothered by some discomfort in her leg and then more recently her hip and leg.  It is felt that this is an orthopedic problem.  She received a short course of steroids which has not helped completely.  She needs to be on an NSAID, but she wants to be sure this will be okay with me.  Current Medications (verified): 1)  Aspirin Ec 325 Mg Tbec (Aspirin) .... Take One Tablet By Mouth Daily 2)  Metoprolol Tartrate 25 Mg Tabs (Metoprolol Tartrate) .[name].. 1 Tab By Mouth Two Times A Day 3)  Crestor 20 Mg Tabs (Rosuvastatin Calcium) .... Take One Tablet By Mouth Daily. 4)  Nitroglycerin 0.4 Mg Subl (Nitroglycerin) .... One Tablet Under Tongue Every 5 Minutes As Needed For Chest Pain---May Repeat Times Three 5)  Prometrium 100 Mg Caps (Progesterone Micronized) .... Once Daily 6)  Climara 0.05 Mg/24hr Ptwk (Estradiol) .... Q Week 7)  Acetaminophen 500 Mg Tabs (Acetaminophen) .... 2 Prn 8)  Ramipril 5 Mg Caps (Ramipril) .[name].. 1 Tab Once Daily 9)  Vitamin D 1000 Unit  Tabs (Cholecalciferol) .... Once Daily 10)  Co Q-10 30 Mg  Caps (Coenzyme Q10) .... Once Daily 11)  Fish Oil   Oil (Fish Oil) .... 1000mg  Once Daily 12)  Calcium Carbonate-Vitamin D 600-400 Mg-Unit  Tabs (Calcium Carbonate-Vitamin D) .... 5 Once Daily With Magnesium 13)  Supreme Dophilus .Marland KitchenMarland Kitchen. 1 Cap Once Daily 14)  Flexeril 5 Mg Tabs (Cyclobenzaprine Hcl) .... As Needed 15)  Percocet 5-325 Mg Tabs (Oxycodone-Acetaminophen) .... As Needed  Allergies (verified): 1)  ! Penicillin 2)  ! Erythromycin  Past History:  Past Medical History: carotid  artery disease...40-59% bilateral... Doppler December, 2010 Allergy to PENICILLIN, E-MYCIN, and SHELLFISH. PVCs.... no significant arrhythmias EF..  45%.  /  echo..12/27/2008.Marland KitchenMarland KitchenEF  55-60% Aortic valve sclerosis....echo...12/2008....soft systolic murmur. volume overload.... mild.... none since hospital in the past dyslipidemia CAD.......PCI.mid right coronary bare-metal stent.Marland KitchenMarland KitchenOctober, 2009  /  stress echo.. January, 2011... subtle inferior hypokinesis with stress  /    catheterization March 17, 2009... RCA stent patent... severe, but stable stenosis of the distal posterior lateral branch RCA...LV normal... medical therapy unless more symptoms Right leg and right hip pain.... November, 2011       Review of Systems       Patient denies fever, chills, headache, sweats, rash, change in vision, change in hearing, chest pain, cough, nausea vomiting, urinary symptoms.  All other systems are reviewed and are negative.  Vital Signs:  Patient profile:   60 year old female Height:      60 inches Weight:      162 pounds BMI:     31.75 Pulse rate:   66 / minute BP sitting:   104 / 62  (left arm) Cuff size:   regular  Vitals Entered By: Hardin Negus, RMA (December 25, 2009 2:52 PM)  Physical Exam  General:  patient is stable.  She is uncomfortable with her leg and hip pain. Head:  head is atraumatic. Eyes:  no xanthelasma. Neck:  no jugular venous distention. Chest Wall:  no chest wall tenderness. Lungs:  lungs are clear.  Respiratory effort is nonlabored. Heart:  cardiac exam reveals S1-S2.  No clicks or significant murmurs. Abdomen:  abdomen is soft. Msk:  patient has pain with motion of her leg her hip. Extremities:  no peripheral edema. Skin:  no skin rashes. Psych:  patient is oriented to person time and place.  Affect is normal.   Impression & Recommendations:  Problem # 1:  * RIGHT HIP AND LEG PAIN The patient unfortunately is having significant right leg and right hip  pain.  I reviewed the issue with our pharmacology team.  Currently we recommend a generic Mobic as the best choice for patients with coronary disease.  Problem # 2:  CAROTID ARTERY DISEASE (ICD-433.10)  Her updated medication list for this problem includes:    Aspirin Ec 325 Mg Tbec (Aspirin) .[name]... Take one tablet by mouth daily This is stable.  No further workup.  Problem # 3:  CAD (ICD-414.00)  Her updated medication list for this problem includes:    Aspirin Ec 325 Mg Tbec (Aspirin) .[name]... Take one tablet by mouth daily    Metoprolol Tartrate 25 Mg Tabs (Metoprolol tartrate) .[name]... 1 tab by mouth two times a day    Nitroglycerin 0.4 Mg Subl (Nitroglycerin) ..... One tablet under tongue every 5 minutes as needed for chest pain---may repeat times three    Ramipril 5 Mg Caps (Ramipril) .[name]... 1 tab once daily  Orders: EKG w/ Interpretation (93000) Coronary disease is stable.  He is very EKG is done today and reviewed by me.  There is no significant change.  No further workup.  I believe that her shortness of breath walking up the hill was related to the exercise level.  There is no definite sign of ischemia.  Problem # 4:  FLUID OVERLOAD (ICD-276.6) Fluid status is stable.  No change in therapy.  Problem # 5:  DYSLIPIDEMIA (ICD-272.4)  Her updated medication list for this problem includes:    Crestor 20 Mg Tabs (Rosuvastatin calcium) .[name]... Take one tablet by mouth daily. The patient's most recent LDL is higher than usual.  I've chosen not to change the dose of the meds.  She will try to watch her diet better if possible.  Posterior back to followup.  Patient Instructions: 1)  Per Dr. Myrtis Ser the safest nonsteroidal to use would be Mobic (Meloxicam). 2)  Your physician recommends that you schedule a follow-up appointment in: 8 weeks

## 2010-03-20 NOTE — Miscellaneous (Signed)
  Clinical Lists Changes  Observations: Added new observation of PAST MED HX:  Allergy to PENICILLIN, E-MYCIN, and SHELLFISH. PVCs.... no significant arrhythmias EF..  45%.  /  echo..12/27/2008.Marland KitchenMarland KitchenEF  55-60% Aortic valve sclerosis....echo...12/2008....soft systolic murmur. volume overload.... mild.... none since hospital in the past dyslipidemia CAD.......PCI.mid right coronary bare-metal stent.Marland KitchenMarland KitchenOctober, 2009  /   catheterization March 17, 2009... RCA stent patent... severe, but stable stenosis of the distal posterior lateral branch RCA...LV normal... medical therapy unless more symptoms       (04/05/2009 13:41) Added new observation of PRIMARY MD: [name], MD (04/05/2009 13:41)       Past History:  Past Medical History:  Allergy to PENICILLIN, E-MYCIN, and SHELLFISH. PVCs.... no significant arrhythmias EF..  45%.  /  echo..12/27/2008.Marland KitchenMarland KitchenEF  55-60% Aortic valve sclerosis....echo...12/2008....soft systolic murmur. volume overload.... mild.... none since hospital in the past dyslipidemia CAD.......PCI.mid right coronary bare-metal stent.Marland KitchenMarland KitchenOctober, 2009  /   catheterization March 17, 2009... RCA stent patent... severe, but stable stenosis of the distal posterior lateral branch RCA...LV normal... medical therapy unless more symptoms

## 2010-03-20 NOTE — Assessment & Plan Note (Signed)
Summary: per check out/sf   Visit Type:  Follow-up Primary Provider:  [name], MD  CC:  CAD.  History of Present Illness: The patient is seen for followup of coronary artery disease.  I saw her last April 06 2009.  She had some indigestion and we did a stress echo.  Ejection fraction was 55% and there was subtle inferior hypokinesis.  Catheterization was done.  A right coronary artery stent was patent.  She had significant stenosis of the distal posterior lateral branch of the right.  This was stable.  It supplies a small amount of muscle.  The patient understands that she has potential for some ischemia in this distribution.  Since that time she felt well and done well.  Also on Crestor her lipids are now excellent.  Current Medications (verified): 1)  Aspirin Ec 325 Mg Tbec (Aspirin) .... Take One Tablet By Mouth Daily 2)  Metoprolol Tartrate 25 Mg Tabs (Metoprolol Tartrate) .[name].. 1 Tab By Mouth Two Times A Day 3)  Crestor 20 Mg Tabs (Rosuvastatin Calcium) .... Take One Tablet By Mouth Daily. 4)  Nitroglycerin 0.4 Mg Subl (Nitroglycerin) .... One Tablet Under Tongue Every 5 Minutes As Needed For Chest Pain---May Repeat Times Three 5)  Prometrium 100 Mg Caps (Progesterone Micronized) .... Once Daily 6)  Climara 0.05 Mg/24hr Ptwk (Estradiol) .... Q Week 7)  Acetaminophen 500 Mg Tabs (Acetaminophen) .... 2 Prn 8)  Ramipril 5 Mg Caps (Ramipril) .[name].. 1 Tab Once Daily 9)  Vitamin D 1000 Unit  Tabs (Cholecalciferol) .... Once Daily 10)  Co Q-10 30 Mg  Caps (Coenzyme Q10) .... Once Daily 11)  Fish Oil   Oil (Fish Oil) .... 1000mg  Once Daily 12)  Calcium Carbonate-Vitamin D 600-400 Mg-Unit  Tabs (Calcium Carbonate-Vitamin D) .... 5 Once Daily With Magnesium 13)  Supreme Dophilus .Marland KitchenMarland Kitchen. 1 Cap Once Daily  Allergies (verified): 1)  ! Penicillin 2)  ! Erythromycin  Past History:  Past Medical History: Last updated: 04/06/2009 carotid artery disease...40-59% bilateral... Doppler  December, 2010 Allergy to PENICILLIN, E-MYCIN, and SHELLFISH. PVCs.... no significant arrhythmias EF..  45%.  /  echo..12/27/2008.Marland KitchenMarland KitchenEF  55-60% Aortic valve sclerosis....echo...12/2008....soft systolic murmur. volume overload.... mild.... none since hospital in the past dyslipidemia CAD.......PCI.mid right coronary bare-metal stent.Marland KitchenMarland KitchenOctober, 2009  /  stress echo.. January, 2011... subtle inferior hypokinesis with stress  /    catheterization March 17, 2009... RCA stent patent... severe, but stable stenosis of the distal posterior lateral branch RCA...LV normal... medical therapy unless more symptoms        Review of Systems       Patient denies fever, chills, headache, sweats, rash, change in vision, change in hearing, chest pain, cough, nausea or vomiting, urinary symptoms.  All of the systems are reviewed and are negative.  Vital Signs:  Patient profile:   60 year old female Weight:      156 pounds Pulse rate:   63 / minute BP sitting:   104 / 62  Vitals Entered By: Meredith Staggers, RN (Jul 13, 2009 7:59 AM)  Physical Exam  General:  patient is quite stable. Eyes:  no xanthelasma. Neck:  no jugular venous distention. Lungs:  lungs are clear respiratory effort is not labored. Heart:  cardiac exam reveals S1 and S2.  No clicks or significant murmurs. Abdomen:  abdomen is soft. Extremities:  no peripheral edema. Psych:  patient is oriented to person time and place.  Affect is normal.   Impression & Recommendations:  Problem # 1:  CAROTID ARTERY DISEASE (ICD-433.10)  Her updated medication list for this problem includes:    Aspirin Ec 325 Mg Tbec (Aspirin) .[name]... Take one tablet by mouth daily The patient had 40-59% bilateral disease by Doppler in January 29, 2009.  She will need followup in December.  Problem # 2:  CAD (ICD-414.00)  Her updated medication list for this problem includes:    Aspirin Ec 325 Mg Tbec (Aspirin) .[name]... Take one tablet by mouth daily     Metoprolol Tartrate 25 Mg Tabs (Metoprolol tartrate) .[name]... 1 tab by mouth two times a day    Nitroglycerin 0.4 Mg Subl (Nitroglycerin) ..... One tablet under tongue every 5 minutes as needed for chest pain---may repeat times three    Ramipril 5 Mg Caps (Ramipril) .[name]... 1 tab once daily Coronary disease is stable.  No change in therapy.  Problem # 3:  FLUID OVERLOAD (ICD-276.6) Her volume status is stable.  No change in therapy.  Problem # 4:  * EJECTION FRACTION 45% Her deck and fraction was 55% by echo in November, 2010.  No change in therapy.  Problem # 5:  PREMATURE VENTRICULAR CONTRACTIONS (ICD-427.69)  Her updated medication list for this problem includes:    Aspirin Ec 325 Mg Tbec (Aspirin) .[name]... Take one tablet by mouth daily    Metoprolol Tartrate 25 Mg Tabs (Metoprolol tartrate) .[name]... 1 tab by mouth two times a day    Nitroglycerin 0.4 Mg Subl (Nitroglycerin) ..... One tablet under tongue every 5 minutes as needed for chest pain---may repeat times three    Ramipril 5 Mg Caps (Ramipril) .[name]... 1 tab once daily She's not having any palpitations.  No change in therapy.  Problem # 6:  DYSLIPIDEMIA (ICD-272.4)  Her updated medication list for this problem includes:    Crestor 20 Mg Tabs (Rosuvastatin calcium) .[name]... Take one tablet by mouth daily. On Crestor her labs are now quite good.  HDL is 66.  LDL is 76.  Triglycerides 101.  Liver function studies are normal.  No change in therapy.  6 month followup.  Patient Instructions: 1)  Follow up in 6 months

## 2010-03-22 NOTE — Assessment & Plan Note (Signed)
Summary: per check out/sf   Visit Type:  Follow-up Primary Provider:  [name], MD  CC:  CAD.  History of Present Illness: The patient is seen for followup of coronary artery disease I saw her last November, 2011.  She received a bare-metal stent to the right coronary artery in October, 2009.  There was a subtle abnormality on the stress study followed by cardiac catheterization in January 2 011.  His stent was patent.  She does have severe but stable stenosis of the distal posterior lateral branch of the RCA.  LV function was good.  Medical therapy was recommended.  Overall she's done well.  She had a respiratory illness that has lingered for close to a month and she is fatigued process.  She has some symptoms that she thinks is reflux.  She think she can distinguish it from her cardiac symptoms.  He does occur at rest and after eating certain foods.  Current Medications (verified): 1)  Aspirin Ec 325 Mg Tbec (Aspirin) .... Take One Tablet By Mouth Daily 2)  Metoprolol Tartrate 25 Mg Tabs (Metoprolol Tartrate) .[name].. 1 Tab By Mouth Two Times A Day 3)  Crestor 20 Mg Tabs (Rosuvastatin Calcium) .... Take One Tablet By Mouth Daily. 4)  Nitroglycerin 0.4 Mg Subl (Nitroglycerin) .... One Tablet Under Tongue Every 5 Minutes As Needed For Chest Pain---May Repeat Times Three 5)  Prometrium 100 Mg Caps (Progesterone Micronized) .... Once Daily 6)  Climara 0.05 Mg/24hr Ptwk (Estradiol) .... Q Week 7)  Acetaminophen 500 Mg Tabs (Acetaminophen) .... 2 Prn 8)  Ramipril 5 Mg Caps (Ramipril) .[name].. 1 Tab Once Daily 9)  Vitamin D 1000 Unit  Tabs (Cholecalciferol) .... Once Daily 10)  Co Q-10 30 Mg  Caps (Coenzyme Q10) .... Once Daily 11)  Supreme Dophilus .Marland KitchenMarland Kitchen. 1 Cap Once Daily 12)  Flexeril 5 Mg Tabs (Cyclobenzaprine Hcl) .... As Needed  Allergies (verified): 1)  ! Penicillin 2)  ! Erythromycin  Past History:  Past Medical History: carotid artery disease...40-59% bilateral... Doppler December,  2010 Allergy to PENICILLIN, E-MYCIN, and SHELLFISH. PVCs.... no significant arrhythmias EF..  45%.  /  echo..12/27/2008.Marland KitchenMarland KitchenEF  55-60% Aortic valve sclerosis....echo...12/2008....soft systolic murmur. volume overload.... mild.... none since hospital in the past dyslipidemia CAD.......PCI.mid right coronary bare-metal stent.Marland KitchenMarland KitchenOctober, 2009  /  stress echo.. January, 2011... subtle inferior hypokinesis with stress  /    catheterization March 17, 2009... RCA stent patent... severe, but stable stenosis of the distal posterior lateral branch RCA...LV normal... medical therapy unless more symptoms Right leg and right hip pain.... November, 2011.[name]       Review of Systems       Patient denies fever, chills, headache, sweats, rash, change in vision, change in hearing, chest pain, cough, urinary symptoms.  All other systems are reviewed and are negative.  Vital Signs:  Patient profile:   60 year old female Height:      60 inches Weight:      165 pounds BMI:     32.34 Pulse rate:   75 / minute BP sitting:   108 / 64  (left arm) Cuff size:   regular  Vitals Entered By: Hardin Negus, RMA (February 22, 2010 4:22 PM)  Physical Exam  General:  patient is stable. Eyes:  normal extraocular motion. Neck:  no jugular venous distention. Lungs:  lungs are clear.  Respiratory effort is nonlabored. Heart:  cardiac exam is S1-S2.  There no clicks or significant murmurs. Abdomen:  abdomen is soft. Extremities:  no peripheral edema. Psych:  patient is oriented to person time and place.  Affect is normal.   Impression & Recommendations:  Problem # 1:  CAROTID ARTERY DISEASE (ICD-433.10)  Her updated medication list for this problem includes:    Aspirin Ec 325 Mg Tbec (Aspirin) .[name]... Take one tablet by mouth daily The patient does have carotid disease.  She will be scheduled to have a followup Doppler in the near future.  Problem # 2:  OVERWEIGHT (ICD-278.02) It would be  helpful for her to lose  some weight.  Problem # 3:  CAD (ICD-414.00)  Her updated medication list for this problem includes:    Aspirin Ec 325 Mg Tbec (Aspirin) .[name]... Take one tablet by mouth daily    Metoprolol Tartrate 25 Mg Tabs (Metoprolol tartrate) .[name]... 1/2 tab by mouth two times a day    Nitroglycerin 0.4 Mg Subl (Nitroglycerin) ..... One tablet under tongue every 5 minutes as needed for chest pain---may repeat times three    Ramipril 5 Mg Caps (Ramipril) .[name]... 1 tab once daily Coronary disease is stable.  I discussed with her the fact that I believe that patency of her bare metal stent at greater than one year is a very good sign that it will remain patent.  She does have distal right coronary disease that could cause symptoms.  Problem # 4:  FLUID OVERLOAD (ICD-276.6) Her fluid status is stable.  No change in therapy.  Problem # 5:  AORTIC VALVE SCLEROSIS (ICD-424.1)  Her updated medication list for this problem includes:    Metoprolol Tartrate 25 Mg Tabs (Metoprolol tartrate) .[name]... 1/2 tab by mouth two times a day    Nitroglycerin 0.4 Mg Subl (Nitroglycerin) ..... One tablet under tongue every 5 minutes as needed for chest pain---may repeat times three    Ramipril 5 Mg Caps (Ramipril) .[name]... 1 tab once daily She does have aortic valve sclerosis.  There is no need for followup echo at this time.  Problem # 6:  * RESPIRATORY ILLNESS AND FATIGUE. Overall she is slowly recovering from her prolonged outpatient respiratory illness.  She does have some fatigue.  This may be related to this illness.  I have decided to decrease her metoprolol to 12.5 b.i.d.  I plan to see her back in 6 months.  Patient Instructions: 1)  Decrease Metoprolol to 1/2 tab two times a day  2)  Your physician wants you to follow-up in:  6 months.  You will receive a reminder letter in the mail two months in advance. If you don't receive a letter, please call our office to schedule the follow-up appointment.

## 2010-07-03 NOTE — Assessment & Plan Note (Signed)
[facility]                            CARDIOLOGY OFFICE NOTE   Courtney, [name]                        MRN:          [id number]  DATE:05/06/2008                            DOB:          [birth year]    [name] is doing well.  She has coronary artery disease.  She received  a drug-eluting stent in October 2009.  She is not having any recurrent  chest pain.  She is fully active.  She had some mild LV dysfunction.  We  will not plan to repeat her echo yet at this time.   She is going about full activities.  She continues to dance.   PAST MEDICAL HISTORY:   ALLERGIES:  PENICILLIN, E-MYCIN, and SHELLFISH.   MEDICATIONS:  See the EMR.   REVIEW OF SYSTEMS:  She feels well.  She is not having any chest pain.  She has some mild dizziness, which is probably seasonal allergies.  She  has no GI or GU symptoms.  There are no fevers, chills or sweats.  There  is no skin rashes.  Otherwise, review of systems is negative.   PHYSICAL EXAMINATION:  VITAL SIGNS:  Blood pressure is 118/70.  The  pulse is 60.  GENERAL:  The patient is oriented to person, time, and place.  Affect is  normal.  HEENT:  No xanthelasma.  She has normal extraocular motion.  NECK:  There are no carotid bruits.  There is no jugular venous  distention.  LUNGS:  Clear.  Respiratory effort is not labored.  CARDIAC:  An S1 with an S2.  There are no clicks or significant murmurs.  ABDOMEN:  Soft.  EXTREMITIES:  She has no peripheral edema.   EKG reveals inverted T-wave in lead III.   [name] is quite stable.  Her problems are listed on note of January 28, 2008.  No change in her medications.  I will see her back in 6  months.     Luis Abed, MD, [facility]  Electronically Signed    JDK/MedQ  DD: 05/06/2008  DT: 05/07/2008  Job #: [id number]

## 2010-07-03 NOTE — Assessment & Plan Note (Signed)
[facility]                            CARDIOLOGY OFFICE NOTE   Courtney [name], Courtney [name]                        MRN:          [id number]  DATE:12/17/2007                            DOB:          [birth year]    Courtney [name] is here post-hospitalization.  She had acute non-ST elevation  MI.  She has a history of hyperlipidemia.  Approximately 10 days before  being admitted to [facility] on November 28, 2007, she had some  intermittent chest burning.  She had some also on the day of admission.  Her first troponin was 0.7.  On the next morning, she had recurrent  chest discomfort and she went immediately to the cath lab.  In the lab,  she had an occluded mid right coronary artery.  Ejection fraction was  45% with inferior hypokinesis.  Her right coronary was stented with 3.0  x 15 mm Vision bare-metal stent.  She tolerated this well.  She did have  some bradycardia post-procedure, which then improved.  Because of this,  we did not start either beta-blockers or ACE inhibitors.  She  stabilized.  After she was sent to the floor, she did have some mild  volume overload.  Some of this was from volume treatment that I had  given her with her low blood pressure and low heart rate.  She was sent  home on low-dose Lasix.  Since being at home, she has done well.  She  has not had any chest pain.  She has not had any syncope or presyncope.  She does note some palpitations that are rare.   PAST MEDICAL HISTORY:   ALLERGIES:  PENICILLIN, E-MYCIN, and SHELLFISH.   MEDICATIONS:  1. Prometrium 100.  2. Femara patch.  3. Potassium 20.  4. Aspirin 325.  5. Plavix 75.  6. Lopressor 12.5 b.i.d.  7. Zocor 40.  8. Lasix 20.  9. Vitamin D.   OTHER MEDICAL PROBLEMS:  See the list below.   REVIEW OF SYSTEMS:  She is not having any GI or GU symptoms.  She has  had no fever or chills.  She has no rashes.  Otherwise, her review of  systems is negative.   PHYSICAL EXAMINATION:   VITAL SIGNS:  Blood pressure is 132/80.  It had  been as low as 100/70 at rehab today.  Her pulse is 64.  GENERAL:  The patient is oriented to person, time, and place.  Affect is  normal.  HEENT:  No xanthelasma.  She has normal extraocular motion.  There is  question of a soft right carotid bruit.  LUNGS:  Clear.  Respiratory effort is not labored.  CARDIAC:  S1 with an S2.  There are no clicks or significant murmurs.  ABDOMEN:  Soft.  EXTREMITIES:  She has no peripheral edema.   We have not done an EKG today.  We will obtain records from the  hospital.   PROBLEMS:  1. 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  2. Allergy to PENICILLIN, E-MYCIN, and SHELLFISH.  3. History of premature ventricular contractions.  She  is also noting      some ectopy at this time.  We will proceed with a 24-hour Holter to      assess her post myocardial infarction.  4. Ejection fraction said to be in the 45% range after her myocardial      infarction.  5. Mild volume overload in the hospital.  I have decided that this      point to stop her Lasix, then it will be easier for me to make      decisions about whether we can add an ACE inhibitor.  6. Hyperlipidemia on medications and we will follow this to be      aggressive.  7. Post myocardial infarction bradycardia which is resolved.  8. **Coronary artery disease status post successful percutaneous      coronary intervention with stenting of the mid right coronary      artery with a Multi-Link Vision bare-metal stent.   She is at cardiac rehab.  It is okay for her to return to light dancing.  We will check carotid Dopplers.  She will wear a Holter monitor.  We  will check a BMET and we will stop her Lasix and potassium.  I will see  her back in 2-3 weeks to take further decisions then about whether we  can then add an ACE inhibitor.     Luis Abed, MD, [facility]  Electronically Signed    JDK/MedQ  DD: 12/17/2007  DT: 12/18/2007  Job #: [id number]

## 2010-07-03 NOTE — Cardiovascular Report (Signed)
Courtney [name], Courtney [name]                 ACCOUNT NO.:  [id number]   MEDICAL RECORD NO.:  [id number]          PATIENT TYPE:  INP   LOCATION:  2901                         FACILITY:  [facility]   PHYSICIAN:  Veverly Fells. Excell Seltzer, MD  DATE OF BIRTH:  1950-04-20   DATE OF PROCEDURE:  11/29/2007  DATE OF DISCHARGE:                            CARDIAC CATHETERIZATION   PROCEDURES:  1. Left heart catheterization.  2. Selective coronary angiography.  3. Left ventricular angiography.  4. Percutaneous transluminal coronary angioplasty, aspiration, and      stenting of the right coronary artery.  5. Angio-Seal of the right femoral artery.   INDICATIONS:  Courtney [name] is a 60 year old woman who presented with an  acute coronary syndrome.  Her cardiac enzymes have increased over time  and she has had refractory angina.  She was brought emergently to the  cardiac cath lab in the setting of ongoing ischemia and elevated cardiac  biomarkers.  She did not have ST elevation on EKG.   Risks and indications of the procedure were reviewed with the patient in  detail.  Informed consent was obtained.  The right groin was prepped,  draped, and anesthetized with 1% lidocaine.  Using the modified  Seldinger technique, a 6-French sheath was placed in the right femoral  artery.  Standard 6-French Judkins catheters were used for coronary  angiography.  Diagnostic angiography demonstrated nonobstructive left  coronary artery disease and total occlusion of the mid right coronary  artery.  I elected to proceed with PCI of the right coronary artery.  A  6-French JR4 guide catheter was used.  The patient had been preloaded  with 600 mg of Plavix and also had received heparin.  Her ACT was 200.  Angiomax was used for anticoagulation.  A Cougar guidewire was passed  with moderate amount of difficulty beyond the lesion into the distal  right coronary artery.  I elected to predilate the vessel with 2.5 x 12  mm apex balloon, which was  taken to 10 atmospheres.  Following balloon  dilatation, there was restoration of flow in the vessel, however, there  was a large residual thrombus burden and the flow only improved to TIMI  1.  At that point, Integrilin was added with a double bolus and drip.  A  fetch aspiration catheter was used and 2 aspiration thrombectomy runs  were performed.  Following aspiration, there was improvement in flow.  I  proceeded to stent the vessel with a bare-metal stent.  A bare-metal  stent was chosen because of heavy thrombus burden and an apparently  focal lesion.  A 3.0 x 15 mm Vision stent was used.  After careful  positioning, the stent was deployed at 14 atmospheres.  Following  stenting, there was TIMI 3 flow in the vessel.  The stent was well  expanded.  There was residual stenosis of the distal aspect of the  stent, but it does not appear hemodynamically significant.  The stent  was then postdilated with a 3.25 x 12 mm Quantum Maverick balloon, which  was taken to 16 atmospheres on 2 inflations  to cover the entire stented  segment.  Following postdilatation, there was an excellent angiographic  result with TIMI 3 flow in a well-expanded stent.  At that point, I felt  the patient had received the maximum benefit of her intervention.  There  was a residual high-grade stenosis in a posterolateral branch, but since  this serves a fairly small amount of myocardium and was not involved in  the infarct, I elected not to treat that area.  The patient tolerated  the entire procedure well.  She remained hemodynamically significant and  had no arrhythmias.  The guidewire and catheter were removed and a  pigtail catheter was inserted in the left ventricle.  Ventriculography  was performed.  A pullback across the aortic valve was done.  The  femoral arteriotomy was then closed with an Angio-Seal device.   FINDINGS:  Aortic pressure 101/55 with a mean of 74 and left ventricular  pressure 104/25.    Left ventriculography:  There is moderate inferior wall hypokinesis  affecting the basal and midportions of the ventricle.  The LVEF is  estimated at 45%.  There is 2+ mitral regurgitation present.   CORONARY ANGIOGRAPHY:  Left mainstem:  The left mainstem is patent.  It  is a small left main with mild distal stenosis as well as mild  calcification.  The lesion at the distal left mainstem is  angiographically not worse than 20-30% stenosed.  The left main  bifurcates into the LAD and left circumflex.   LAD:  The LAD is a moderate-sized vessel.  It courses down and reaches  the LV apex.  There is a single diagonal branch from the proximal LAD.  There is some distal diagonal branches that are very small.  The LAD at  the area of the first perforator and first diagonal branch has a 20%  stenosis.  There is no other stenosis seen throughout the vessel.  There  is mild bridging in the mid LAD beyond the first diagonal branch.   Left circumflex:  Left circumflex is patent.  Just beyond a high OM  branch, there is a 50% stenosis present.  The OM is a large vessel that  has no significant disease.  It bifurcates into twin vessels distally.  The circumflex courses down and has a second nonobstructive 30-40%  stenosis and then supplies a OM2 branch and the posterolateral branch.  Those branches have no significant stenosis.   The right coronary artery appears to have mild stenosis proximally.  The  midportion of the vessel is occluded with TIMI 1 flow beyond the area of  occlusion.  The PDA and posterolateral branch fill from left-to-right  collaterals.  Once the vessel was opened, we could visualize that the  distal vessel had mild nonobstructive plaque.  The PDA was widely  patent.  The first posterolateral branch had an 80% focal stenosis and  second and third posterolateral branches were small without significant  disease.   ASSESSMENT:  1. A 100% right coronary artery occlusion with  successful percutaneous      coronary intervention as detailed above.  A bare-metal stent was      used.  2. Nonobstructive left mainstem, left anterior descending, and left      circumflex stenoses.  3. Mild left ventricular dysfunction consistent with inferior wall      myocardial infarction.   PLAN:  The patient will be transferred to the CCU.  She will be  continued on aspirin and Plavix.  Continue  Integrilin for 6 hours post  PCI.  Routine post MI medical therapy.      Veverly Fells. Excell Seltzer, MD  Electronically Signed     MDC/MEDQ  D:  11/29/2007  T:  11/29/2007  Job:  [id number]

## 2010-07-03 NOTE — Assessment & Plan Note (Signed)
[facility]                            CARDIOLOGY OFFICE NOTE   Courtney [name], Courtney [name]                        MRN:          [id number]  DATE:01/28/2008                            DOB:          [birth year]    Courtney [name] is doing very well.  She had a non-ST-elevation MI and  received a bare-metal stent.  She is not having any exertional pain.  With stressful moments, she has had some slight discomfort and taken a  nitroglycerin on 1 or 2 occasions.  I do not believe, there is a sign of  any significant ischemia.  We started low-dose ACE inhibitor.  She is  stable with this.   PAST MEDICAL HISTORY:   ALLERGIES:  PENICILLIN, E-MYCIN, and SHELLFISH.   MEDICATIONS:  See the flow sheet.   REVIEW OF SYSTEMS:  She has no GI or GU symptoms.  She has not had any  fevers or chills.  She has no headaches or skin rashes.  Her review of  systems otherwise is negative.   PHYSICAL EXAMINATION:  VITAL SIGNS:  Blood pressure is 116/64.  Her  pulse is 60.  GENERAL:  The patient is oriented to person, time, and place.  Affect is  normal.  HEENT:  No xanthelasma.  She has normal extraocular motion.  NECK:  There are no carotid bruits.  There is no jugular venous  distention.  LUNGS:  Clear.  Respiratory effort is not labored.  CARDIAC:  An S1 with an S2.  There are no clicks or significant murmurs.  ABDOMEN:  Soft.  EXTREMITIES:  She has no peripheral edema.   The patient is stable.   PROBLEMS INCLUDE:  1. Mild aortic valve sclerosis.  2. Allergy to PENICILLIN, E-MYCIN, and SHELLFISH.  3. Premature ventricular contractions noted on Holter.  She does not      have any high-grade arrhythmias.  4. Ejection fraction is said to be 45% after myocardial infarction.      When I see her back in 3 months, we will talk about a followup      echo.  5. Mild volume overload in the hospital.  We were able to stop her      Lasix and she has not reaccumulated fluid.  We have  begun low-dose      ACE inhibitor.  6. Hyperlipidemia, on medication.  7. Post myocardial infarction bradycardia.  This resolved.  8. Coronary artery disease, status post successful percutaneous      coronary intervention with stenting of the mid right coronary with      a Multilink Vision bare-metal stent.  We have      decided to titrate her ramipril dose up from 2.5 to 5.  I will not      push it any higher.  If she has any symptomatic hypotension, she      will cut back to 2.5.  I will see her back in 3 months.     Luis Abed, MD, [facility]  Electronically Signed    JDK/MedQ  DD: 01/28/2008  DT: 01/29/2008  Job #: [id number]

## 2010-07-03 NOTE — Assessment & Plan Note (Signed)
[facility]                            CARDIOLOGY OFFICE NOTE   Courtney [name]                        MRN:          [id number]  DATE:01/07/2008                            DOB:          1950/05/08    Courtney [name] is seen for cardiology followup.  She is doing well.  She had  a non-ST elevation MI.  She received a bare-metal stent.  She has not  had any recurrent chest pain.  I allowed her to return to light dancing,  which she has done.  She is also back in work at part-time and doing  well and increasing her hours.  She did wear a Holter monitor to be sure  that she was not having any marked arrhythmias because she had noted  some palpitations.  She had only rare PVCs.  I have reviewed all of the  strips completely.  She also had a carotid Doppler.  This shows only  minimal disease and follow up in 1 year will be appropriate.  Because of  her blood pressure and pulse had been low, we had her only on low-dose  of beta-blocker and not yet been able to start an ACE inhibitor.  Today,  we can start the ACE inhibitor.   PAST MEDICAL HISTORY:   ALLERGIES:  PENICILLIN, E-MYCIN, and SHELLFISH.   MEDICATIONS:  Prometrium, Climara patch, potassium, aspirin, Plavix,  Lopressor, Zocor, and vitamin D.  Now that her Lasix has been stopped.  We will check to be sure that she stops her potassium.   OTHER MEDICAL PROBLEMS:  See the complete list below.   REVIEW OF SYSTEMS:  She feels a little draggy.  She is not having any  GI or GU symptoms.  She has no fevers or chills.  There was no  headaches.  She has no skin rashes.  Otherwise, her review of systems is  negative.   PHYSICAL EXAMINATION:  VITAL SIGNS:  Blood pressure is 110/64 with a  pulse of 65.  GENERAL:  The patient is oriented to person, time, and place.  Affect is  normal.  HEENT:  No xanthelasma.  She has normal extraocular motion.  NECK:  There are no carotid bruits.  There is no jugular  venous  distention.  LUNGS:  Clear.  Respiratory effort is not labored.  CARDIAC:  S1 and S2.  There are no clicks or significant murmurs.  ABDOMEN:  Soft.  She has no significant peripheral edema.   PROBLEMS:  1. Mild aortic sclerosis.  2. Allergies to PENICILLIN, E-MYCIN, and SHELLFISH.  3. History of some premature ventricular contractions.  Her Holter      monitor reveals only very rare scattered premature ventricular      contractions.  4. Ejection fraction in the range of 45% after her MI.  We need to get      her on an angiotensin-converting enzyme inhibitor and low-dose      ramipril will be started today at 2.5 mg.  5. Mild volume overload while in the hospital.  Ultimately, we were  able to stop her Lasix and will be sure that her potassium has      stopped.  6. Hyperlipidemia on medication.  7. Post myocardial infarction bradycardia.  This is stable on low-dose      beta-blockade.  8. Coronary artery disease, status post percutaneous coronary      intervention with a bare-metal stent to her mid right coronary      artery.  9. Angiotensin-converting enzyme inhibitors to be started today.  We      will be in contact with rehab to make sure they allow her to      exercise fully.  I will see her back in 3 weeks.     Luis Abed, MD, [facility]  Electronically Signed    JDK/MedQ  DD: 01/07/2008  DT: 01/08/2008  Job #: [id number]   cc:   Courtney [name], [name]

## 2010-07-03 NOTE — H&P (Signed)
NAMEJEMMIE, LEDGERWOOD NO.:  [id number]   MEDICAL RECORD NO.:  [id number]          PATIENT TYPE:  INP   LOCATION:  2923                         FACILITY:  [facility]   PHYSICIAN:  Wendi Snipes, MD DATE OF BIRTH:  August 17, 1950   DATE OF ADMISSION:  11/28/2007  DATE OF DISCHARGE:                              HISTORY & PHYSICAL   CARDIOLOGIST:  Dr. Myrtis Ser she has seen one time in 2007.   PRIMARY CARE DOCTOR:  Dr. Valentina Lucks.   CHIEF POINT:  Chest pain.   HISTORY OF PRESENT ILLNESS:  This is a 60 year old white female with  hyperlipidemia here with chest burning on and off for the past 10 days.  She describes these symptoms as mid chest burning without any radiation  and is similar to her indigestion.  She has taken a number of different  antacids without relief.  There is pain over the past 10 days.  She  noted yesterday that the pain occurred with walking uphill and was  relieved with rest.  However, today her symptoms occurred at  approximately 6:00 p.m. while resting and was associated with presyncope  that lasted for moments.  Her chest hurt for approximately one hour  until she arrived at the ED and received therapy.  She does note that  she was dancing vigorously earlier today and had no symptoms.  She was  evaluated for symptomatic PVCs approximately one year ago and had a  negative ischemic workup by a nuclear stress test.  Since then, she has  been in her usual state of health prior to the onset of these symptoms.  Otherwise, she denies any shortness of breath, nausea, palpitations  paroxysmal nocturnal dyspnea, increased lower extremity erythema, edema  or orthopnea.   PAST MEDICAL HISTORY:  1. Mild aortic sclerosis.  2. Palpitations secondary to PVCs.  3. Hyperlipidemia.  4. Normal exercise nuclear stress test in 2007.   MEDICATIONS:  1. Zocor 20 mg daily.  2. Ibuprofen as needed.  3. Prometrium.   ALLERGIES:  PENICILLIN, ERYTHROMYCIN AND SHELL FISH  ALL OF WHICH GIVE  HER A RASH.   SOCIAL HISTORY:  She lives in [place] with her friend.  She is a  Engineer, civil (consulting).  She does not smoke or use drugs.  She occasionally drinks  alcohol.   FAMILY HISTORY:  Mom had stroke in her 37s, and her father had an MI in  his 39s.   REVIEW OF SYSTEMS:  All 14 systems were reviewed were negative except as  mentioned in HPI.   PHYSICAL EXAMINATION:  VITAL SIGNS:  Blood pressure is 120/49,  respiratory rate 16, pulses 49.  She is sating 99% 2 liters nasal  cannula.  GENERAL:  She is a 60 year old white female appearing stated age.  No  acute distress.  HEENT:  Moist mucous membranes.  Pupils were are equal, round, react to  light accommodation.  Anicteric sclera.  NECK:  No jugular venous distention.  No thyromegaly.  CARDIOVASCULAR:  Regular rate and rhythm.  No murmurs, rubs or gallops.  LUNGS:  Clear to auscultation bilaterally.  SKIN:  Warm, dry, intact.  No rashes.  ABDOMEN:  Nontender, nondistended.  Positive bowel sounds.  No masses.  EXTREMITIES:  No clubbing, cyanosis, edema, 2+ pulses throughout.  NEUROLOGICAL:  Alert and x3.  Cranial nerves II-XII are grossly intact.  No focal neurologic deficits.  PSYCH:  Mood and affect are appropriate.   RADIOLOGY REVIEW:  Chest x-ray still pending.  EKG shows sinus  bradycardia at 52 beats per minute with nonspecific T-wave abnormalities  in inferior leads.   LABORATORY REVIEW:  White counts 15, hematocrit is 36, platelets 247.  Point of care testing shows troponin 0.7 and CK-MB of 33.   ASSESSMENT/PLAN:  A 60 year old white female with hyperlipidemia, here  with atypical chest pain and increased cardiac enzymes by point-of-care  testing.  1. Chest pain.  Will repeat her cardiac enzymes and repeat rule out      MI.  Her initial enzymes are elevated.  Her story is concerning for      ischemia, and her EKG is abnormal.  Will admit her, treat her with      full-dose anticoagulation with enoxaparin,  start her on high dose      of Lipitor, start an ACE inhibitor and aspirin.  Will have to hold      her beta blocker at this point because of bradycardia.  If she does      indeed rule in, may consider an early invasive approach, especially      in context of her negative ischemic workup a few years ago.  2. Bradycardia as possibly related to inferior lesion.  However, will      hold beta blockers and observed.  She is hemodynamically stable at      this time.      Wendi Snipes, MD  Electronically Signed     BHH/MEDQ  D:  11/28/2007  T:  11/29/2007  Job:  [id number]

## 2010-07-03 NOTE — Discharge Summary (Signed)
NAMESIHAAM, Courtney [name] NO.:  [id number]   MEDICAL RECORD NO.:  [id number]          PATIENT TYPE:  INP   LOCATION:  2014                         FACILITY:  [facility]   PHYSICIAN:  Courtney Abed, MD, FACCDATE OF BIRTH:  Oct 23, 1950   DATE OF ADMISSION:  11/28/2007  DATE OF DISCHARGE:  12/02/2007                               DISCHARGE SUMMARY   PRIMARY CARDIOLOGY:  Courtney Abed, MD, [facility]   PRIMARY CARE Salena Ortlieb:  [name], MD   DISCHARGE DIAGNOSIS:  Acute non-ST segment elevation myocardial  infarction.   SECONDARY DIAGNOSES:  1. Coronary artery disease, status post successful percutaneous      coronary intervention and stenting of the mid-right coronary artery      with placement of a 3.0 x 50 mm Multilink Vision bare metal stent.  2. Post-myocardial infarction bradycardia.  3. Post-myocardial infarction hypotension.  4. Hyperlipidemia.  5. Mild left ventricular dysfunction, ejection fraction of 45% with      inferior hypokinesis.  6. Mild aortic sclerosis.  7. History palpitations.  8. Mild volume overload following aggressive IV hydration.   ALLERGIES:  PENICILLIN, ERYTHROMYCIN, and SELFISH.   PROCEDURES:  1. Left heart cardiac catheterization.  2. Successful percutaneous coronary intervention and stenting of the      mid-right coronary artery with placement of a 3.0 x 50 mm Vision      bare metal stent.   HISTORY OF PRESENT ILLNESS:  A 60 year old Caucasian female with prior  history of hyperlipidemia who was in her usual state of health  approximately 10 days prior to admission when she began experience  intermittent chest burning without radiation.  On the day of admission  at approximately 6 p.m. while resting, she had recurrent discomfort with  associated presyncope.  Pain lasted for approximately 1 hour prior to  presenting to the [facility] ED.  There, she was noted to have mildly  elevated troponin of 0.7 with a CK-MB of 33.  The  patient was admitted  for evaluation and management of non-ST-elevation MI.   HOSPITAL COURSE:  On the morning of November 29, 2007, the patient had  recurrent chest discomfort.  It was felt necessary for her to go to the  Cath Lab urgently.  She was treated with 600 mg of oral Plavix and taken  to the Cath Lab where left heart cardiac catheterization revealed an  occluded mid RCA with otherwise nonobstructive disease.  EF was 45% with  inferior hypokinesis.  The RCA was successfully stented with a 3.0 x 15  mm Vision bare metal stent.  She tolerated this procedure well and  postprocedure was maintained in the Coronary Intensive Care Unit.  Postprocedurally, she experienced asymptomatic bradycardia as well as  hypotension with heart rates in the 40s and blood pressures in the high  80s to low 90s.  This prevented initiating either beta-blocker or ACE  inhibitor therapy.  She was maintained on aspirin, high-dose Plavix, and  high-dose statin therapy, all of which she tolerated.  She was  transferred out to the floor on November 30, 2007, and secondary to her  hypotension she was given IV fluids.  She underwent chest x-ray on the  morning of December 01, 2007, revealing mild congestive heart failure.  For this reason, her IV fluid was discontinued.  She was treated with 1  dose of Lasix 20 mg IV with good diuresis.   Courtney [name] has been seen by Cardiac Rehab and doing without difficulty or  recurrent symptoms.  We will discharge her home today in good condition.  Notably, beta-blocker and ACE inhibitor have been held throughout her  admission.  At the time of discharge, her heart rate has been in the 60s  to 70s with blood pressures in the 120s and for that reason we will  initiate low-dose beta-blocker now.  We would anticipate initiating ACE  inhibitor if blood pressure can tolerate it, as an outpatient.   DISCHARGE LABORATORY DATA:  Hemoglobin 10.4, hematocrit 30, WBC 11.4,  and platelets  170.  Sodium 141, potassium 3.2 (replaced prior to  discharge), chloride 110, CO2 of 27, BUN 4, creatinine 0.82, glucose 94,  total bilirubin 0.4, alkaline phosphatase 57, AST 67, ALT 27, total  protein 5.4, albumin 3.1, calcium 8.3, and magnesium 1.8.  CK 909, MB  100.8, and troponin-I 20.74.  Total cholesterol 142, triglycerides 54,  HDL 42, and LDL 90.  Urinalysis was negative.   DISPOSITION:  The patient is being discharged home today in good  condition.   FOLLOWUP PLANS AND APPOINTMENTS:  We will arrange followup with Dr. Myrtis Ser  in approximately 2 weeks.  She has to follow up with Dr. Valentina Lucks as  previously scheduled.   DISCHARGE MEDICATIONS:  1. Aspirin 325 mg daily.  2. Plavix 150 mg daily x4 additional days, then 75 mg daily.  3. Lopressor 12.5 mg b.i.d.  4. Zocor 40 mg nightly.  5. Lasix 20 mg daily.  6. K-Dur 20 mEq daily.  7. Vitamin D daily.  8. Prometrium and Climara patch as previous prescribed.  9. Nitroglycerin 0.4 mg sublingual p.r.n. chest pain.   OUTSTANDING LAB STUDIES:  None.   DURATION OF DISCHARGE ENCOUNTER:  60 minutes including physician time.      Courtney [name], ANP      Courtney Abed, MD, [facility]  Electronically Signed   CB/MEDQ  D:  12/02/2007  T:  12/03/2007  Job:  [id number]   cc:   [name], [name]

## 2010-07-06 NOTE — Procedures (Signed)
[facility]  Patient:    Courtney [name], Courtney [name] Visit Number: [id number] MRN: [id number]          Service Type: END Location: ENDO Attending Physician:  Louie Bun Dictated by:   Everardo All Madilyn Fireman, [name] Proc. Date: 03/03/01 Admit Date:  03/03/2001   CC:         Janine Limbo, [name]   Procedure Report  PROCEDURE:  Colonoscopy with polypectomy.  INDICATION FOR PROCEDURE:  Colonoscopy for colon cancer screening and also for left lower quadrant abdominal pain.  DESCRIPTION OF PROCEDURE:  The patient was placed in the left lateral decubitus position than placed on the pulse monitor with continuous low flow oxygen delivered by nasal cannula. She was sedated with 70 mg IV Demerol and 6 mg IV Versed. The Olympus video colonoscope was inserted into the rectum and advanced to the cecum, confirmed by transillumination at McBurneys point and visualization of the ileocecal valve and appendiceal orifice. The prep was excellent. The cecum, ascending, transverse, and descending colon all appeared normal with no masses, polyps, diverticula or other mucosal abnormalities. The sigmoid colon likewise appeared normal. Within the rectum, there was a 7 mm polyp which was removed by snare. The colonoscope was then withdrawn. The patient tolerated the procedure well and there were no immediate complications.  IMPRESSION:  Small rectal polyp otherwise normal colonoscopy.  PLAN:  Await histology for method and interval for future colon screening. Dictated by:   Everardo All Madilyn Fireman, [name] Attending Physician:  Louie Bun DD:  03/03/01 TD:  03/04/01 Job: [id number]

## 2010-07-06 NOTE — Op Note (Signed)
[name], ALL                 ACCOUNT NO.:  [id number]   MEDICAL RECORD NO.:  [id number]          PATIENT TYPE:  AMB   LOCATION:  ENDO                         FACILITY:  [facility]   PHYSICIAN:  John C. Madilyn Fireman, [name]    DATE OF BIRTH:  May 29, 1950   DATE OF PROCEDURE:  08/30/2004  DATE OF DISCHARGE:                                 OPERATIVE REPORT   PROCEDURE:  Colonoscopy.   INDICATIONS FOR PROCEDURE:  Rectal bleeding and a history of rectal  carcinoid three years ago.   DESCRIPTION OF PROCEDURE:  The patient was placed in the left lateral  decubitus position then placed on the pulse monitor with continuous low-flow  oxygen delivered by nasal cannula. She was sedated with 75 mcg IV fentanyl  and 7.5 mg IV Versed. The Olympus video colonoscope was inserted into the  rectum and advanced to the cecum, confirmed by transillumination at  McBurney's point and visualization at the ileocecal valve and appendiceal  orifice. The prep was excellent. The cecum, ascending, transverse,  descending and sigmoid all appeared normal with no masses, polyps,  diverticula or other mucosal abnormalities. The rectum likewise appeared  normal and retroflexed view of the anus revealed only  small internal  hemorrhoids. There is no evidence of a rectal polyp or carcinoid. The scope  was then withdrawn and the patient returned to the recovery room in stable  condition. She tolerated the procedure well and there were no immediate  complications.   IMPRESSION:  Small internal hemorrhoids otherwise normal study.   PLAN:  Next colon screening by sigmoidoscopy in five years and colonoscopy  within 10 years.       JCH/MEDQ  D:  08/30/2004  T:  08/30/2004  Job:  [id number]   cc:   Janine Limbo, [name]  Fax: [phone]

## 2010-07-06 NOTE — Assessment & Plan Note (Signed)
[facility]                            CARDIOLOGY OFFICE NOTE   SAVAYA, HAKES                        MRN:          [id number]  DATE:02/04/2006                            DOB:          [birth year]    Courtney [name] is seen for followup.  See my complete consult note from  December 27, 2005.  At that time we talked at length about her PVCs.  Since that time she has not been bothered and she has been feeling well.  To follow up her systolic murmur a 2D echo was done.  Her ejection  fraction is 60%.  Her wall motion is normal.  There is no significant  LVH.  There is very mild thickening of her aortic valve, compatible with  aortic valve sclerosis but no significant stenosis.  The patient also  walked on the treadmill and had a Myoview scan with walking.  She did  not have any significant ectopy.  Her nuclear images are normal, there  is no sign of scar or ischemia.   Today Courtney [name] is here and she is feeling well.  Her blood pressure is  140/72 and pulse 57.  She has not had any marked palpitations.  I  reviewed all of the information with her very carefully and completely.  I explained that the ejection fraction of 60% is normal.  We did discuss  her cholesterol, she is currently being treated and I told her that I am  certainly in favor of her being on a statin or an elevated LDL, and to  follow up with Dr. Valentina Lucks about this.  Courtney [name] is stable.  She does not need further cardiac workup.  I will  be happy to see her at any time in the future if she has any  difficulties.   PROBLEMS:  1. Systolic murmur.  This is related to mild aortic sclerosis, and      does not need any significant followup within the next 5 years.  2. ALLERGIES TO PENICILLIN, E-MYCIN, AND SHELLFISH.  3. Caffeine intake, and she is limiting this.  4. Palpitations with premature ventricular contractions that are      insignificant for her.  5. Normal left ventricular function  by echo.  6. No sign of ischemia by nuclear exercise testing.     Luis Abed, MD, [facility]  Electronically Signed    JDK/MedQ  DD: 02/04/2006  DT: 02/04/2006  Job #: [id number]   cc:   [name], [name]

## 2010-07-06 NOTE — Assessment & Plan Note (Signed)
[facility]                              CARDIOLOGY OFFICE NOTE   LUVERTA, KORTE                        MRN:          [id number]  DATE:12/27/2005                            DOB:          08-14-1950    Courtney [name] is seen for palpitations.  This very pleasant healthy female  exercises on a regular basis.  She does ballroom dancing at the [employer] on [address].  She does not have any significant symptoms when  dancing.  She has had some palpitations in the past.  More recently, she has  had more.  They occur more frequently in the evening, at rest or when she is  sitting at her desk.  Her exercise is not limited.  She is not having chest  pain.  She has not had any presyncope or syncope.  She might have slight  shortness of breath with exertion at times but this is not a marked finding.   I have received very nice information from Dr. Valentina Lucks.  The patient's labs  reveal no significant abnormalities.  Her TSH is normal.  A Holter monitor  in the past had shown some isolated PVCs.   PAST MEDICAL HISTORY:  Allergies:  PENICILLIN, E-MYCIN, and SHELLFISH.   Medications:  Prometrium, Climara patch, Zocor 20, and Metamucil.   Other medical problems:  See the complete list below.   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  She is in [employer].  She does not smoke.   FAMILY HISTORY:  The patient's father did die of an MI at age 62.  There was  no coronary disease at a young age that was known.   REVIEW OF SYSTEMS:  She has some seasonal allergies.  She is not having any  HEENT problems.  She has no significant GI problems.  There are no  significant GU problems.  She had a left breast biopsy in 2007.  Her review  of systems otherwise is negative.   PHYSICAL EXAMINATION:  GENERAL:  The patient is well developed and well  nourished.  She is oriented to person, time and place,  and her affect is  normal.  HEENT:  Reveals no xanthelasma.  She has normal extraocular motion.  There  is no jugular venous distention.  There is slight radiated murmur heard in  the neck.  LUNGS:  Clear.  Respiratory effort is not labored.  CARDIAC:  Reveals an S1 with an S2.  There is a 2/6 crescendo-decrescendo  murmur.  ABDOMEN:  Soft.  There are no masses or bruits.  There are normal bowel  sounds.  EXTREMITIES:  There is no peripheral edema.  There are 2+ distal pulses.  There are no musculoskeletal deformities.   EKGs recently have shown PVCs.  Her EKG today is completely normal.  There  is mild sinus bradycardia.   As mentioned, her TSH is normal.  Her direct LDL by report is 130.  She is  on a cholesterol medication and is being followed by  Dr. Valentina Lucks for this.   PROBLEMS:  1. Systolic murmur.  She will have a 2-D echocardiogram to assess.  I      suspect that she has aortic valve sclerosis.  2. Allergies to PENICILLIN, E-MYCIN, and SHELLFISH.  3. Caffeine intake.  We do need to try to limit this.  4. Palpitations with premature ventricular contractions noted on her EKGs.   At this point I believe she is stable.  I had a complete discussion with  her, explaining that the PVCs seen in the face of normal LV function is not  a concern.  Therefore, we do need to document her LV function.  In addition,  she will have a stress nuclear scan.  She will walk on the treadmill so that  we can be sure that her rhythm normalizes with stress.  In addition, it is  appropriate to rule out ischemic heart disease in this patient at this time.  We will do these studies and then I will see her back for followup.  She  does not need a Holter monitor at this time.    ______________________________  Luis Abed, MD, [facility]    JDK/MedQ  DD: 12/27/2005  DT: 12/27/2005  Job #: [id number]   cc:   [name], [name]

## 2010-08-31 ENCOUNTER — Other Ambulatory Visit: Payer: Self-pay | Admitting: Cardiology

## 2010-09-17 ENCOUNTER — Encounter: Payer: Self-pay | Admitting: Cardiology

## 2010-10-17 ENCOUNTER — Encounter: Payer: Self-pay | Admitting: Cardiology

## 2010-10-17 DIAGNOSIS — E8770 Fluid overload, unspecified: Secondary | ICD-10-CM | POA: Insufficient documentation

## 2010-10-17 DIAGNOSIS — I739 Peripheral vascular disease, unspecified: Secondary | ICD-10-CM

## 2010-10-17 DIAGNOSIS — E782 Mixed hyperlipidemia: Secondary | ICD-10-CM | POA: Insufficient documentation

## 2010-10-17 DIAGNOSIS — I358 Other nonrheumatic aortic valve disorders: Secondary | ICD-10-CM | POA: Insufficient documentation

## 2010-10-17 DIAGNOSIS — I493 Ventricular premature depolarization: Secondary | ICD-10-CM | POA: Insufficient documentation

## 2010-10-17 DIAGNOSIS — I2510 Atherosclerotic heart disease of native coronary artery without angina pectoris: Secondary | ICD-10-CM | POA: Insufficient documentation

## 2010-10-17 DIAGNOSIS — E785 Hyperlipidemia, unspecified: Secondary | ICD-10-CM | POA: Insufficient documentation

## 2010-10-17 DIAGNOSIS — R9430 Abnormal result of cardiovascular function study, unspecified: Secondary | ICD-10-CM | POA: Insufficient documentation

## 2010-10-17 DIAGNOSIS — I779 Disorder of arteries and arterioles, unspecified: Secondary | ICD-10-CM | POA: Insufficient documentation

## 2010-10-18 ENCOUNTER — Encounter: Payer: Self-pay | Admitting: Cardiology

## 2010-10-18 ENCOUNTER — Ambulatory Visit (INDEPENDENT_AMBULATORY_CARE_PROVIDER_SITE_OTHER): Payer: Self-pay | Admitting: Cardiology

## 2010-10-18 DIAGNOSIS — E8779 Other fluid overload: Secondary | ICD-10-CM

## 2010-10-18 DIAGNOSIS — I2510 Atherosclerotic heart disease of native coronary artery without angina pectoris: Secondary | ICD-10-CM

## 2010-10-18 DIAGNOSIS — I359 Nonrheumatic aortic valve disorder, unspecified: Secondary | ICD-10-CM

## 2010-10-18 DIAGNOSIS — I358 Other nonrheumatic aortic valve disorders: Secondary | ICD-10-CM

## 2010-10-18 DIAGNOSIS — F32A Depression, unspecified: Secondary | ICD-10-CM

## 2010-10-18 DIAGNOSIS — F329 Major depressive disorder, single episode, unspecified: Secondary | ICD-10-CM

## 2010-10-18 DIAGNOSIS — I779 Disorder of arteries and arterioles, unspecified: Secondary | ICD-10-CM

## 2010-10-18 NOTE — Assessment & Plan Note (Signed)
Her volume status is stable. No change in therapy. 

## 2010-10-18 NOTE — Progress Notes (Signed)
HPI Patient is seen today to followup coronary disease.  She has not been having any chest pain.  There is no significant shortness of breath.  She had some mild vertigo. Allergies  Allergen Reactions  . Erythromycin   . Penicillins     Current Outpatient Prescriptions  Medication Sig Dispense Refill  . acetaminophen (TYLENOL) 500 MG tablet Take 1,000 mg by mouth every 6 (six) hours as needed.        [name] aspirin 325 MG EC tablet Take 325 mg by mouth daily.        . cholecalciferol (VITAMIN D) 1000 UNITS tablet Take 1,000 Units by mouth daily.        . cyclobenzaprine (FLEXERIL) 5 MG tablet Take 5 mg by mouth as needed.        [name] estradiol (CLIMARA - DOSED IN MG/24 HR) 0.05 mg/24hr Place 1 patch onto the skin once a week.        . Meloxicam (MOBIC PO) Take by mouth as needed.        . metoprolol tartrate (LOPRESSOR) 25 MG tablet 1/2 tab bid      . nitroGLYCERIN (NITROSTAT) 0.4 MG SL tablet Place 0.4 mg under the tongue every 5 (five) minutes as needed.        . pramipexole (MIRAPEX) 0.125 MG tablet Take 0.125 mg by mouth at bedtime.        . progesterone (PROMETRIUM) 100 MG capsule Take 100 mg by mouth daily.        . ramipril (ALTACE) 5 MG capsule Take 5 mg by mouth daily.        . rosuvastatin (CRESTOR) 20 MG tablet Take 20 mg by mouth daily.        . sertraline (ZOLOFT) 50 MG tablet Take 50 mg by mouth daily.          History   Social History  . Marital Status: Divorced    Spouse Name: N/A    Number of Children: N/A  . Years of Education: N/A   Occupational History  . Not on file.   Social History Main Topics  . Smoking status: Never Smoker   . Smokeless tobacco: Not on file  . Alcohol Use: Yes     occasionally drinks  . Drug Use: No  . Sexually Active:    Other Topics Concern  . Not on file   Social History Narrative  . No narrative on file    Family History  Problem Relation Age of Onset  . Heart attack Father 29    Past Medical History  Diagnosis Date  .  Carotid artery disease     40-59% bilateral, doppler December 2010  . Shellfish allergy   . PVC (premature ventricular contraction)     no significant arrhythmias  . Aortic valve sclerosis     echo 12/2008, soft systolic murmur  . Dyslipidemia   . Coronary artery disease     PCI mid RCA BMS, 11/2007 / stress echo 02/2009 subtle inferior hypokenisis with stress/ cath Jan 2011, RCA stent patent, severe but stable stenosis of distal posterior lateral branch RCA. LV normal, med tx unless more sx   . Right hip pain 12/2009  . CAD (coronary artery disease)     Bare metal stent, RCA, October, 2009 /  subtle abnormality on stress study followed by catheterization, January, 2011, stent patent , there is stable stenosis of distal postero-lateral branch of the RCA, LV function good  . Ejection fraction  EF normal, catheterization, 2011 /  EF 55%, echo, 2010  . Fluid overload     Mild, stable since hospitalization in the past    No past surgical history on file.  ROS  Patient denies fever, chills, headache, sweats, rash, change in vision, change in hearing, chest pain, cough, nausea vomiting, urinary symptoms.  All other systems are reviewed and are negative.  PHYSICAL EXAM Patient is overweight.  She is stable.  Head is atraumatic.  Lungs are clear.  Respiratory effort is nonlabored.  Cardiac exam reveals S1-S2.  No clicks or significant murmurs.  There is no jugular venous distention.  The abdomen is soft.  There is no peripheral edema. Filed Vitals:   10/18/10 1533  BP: 122/74  Pulse: 58  Resp: 12  Height: 5\' 1"  (1.549 m)  Weight: 170 lb (77.111 kg)    EKG Is done today and reviewed by me.  There is no significant change.  ASSESSMENT & PLAN

## 2010-10-18 NOTE — Assessment & Plan Note (Signed)
Patient has had some mild depression that is being treated.  She is quite stable.

## 2010-10-18 NOTE — Assessment & Plan Note (Signed)
Coronary disease is stable. No change in therapy. 

## 2010-10-18 NOTE — Patient Instructions (Signed)
Your physician wants you to follow-up in: 6 months.  You will receive a reminder letter in the mail two months in advance. If you don't receive a letter, please call our office to schedule the follow-up appointment.  Please continue all medications

## 2010-10-18 NOTE — Assessment & Plan Note (Signed)
The patient will now proceed and schedule to have followup carotid Dopplers.  I'll be in touch with her with the information.

## 2010-11-08 ENCOUNTER — Encounter: Payer: Self-pay | Admitting: Cardiology

## 2010-11-12 ENCOUNTER — Encounter (INDEPENDENT_AMBULATORY_CARE_PROVIDER_SITE_OTHER): Payer: Commercial Managed Care - PPO | Admitting: *Deleted

## 2010-11-12 DIAGNOSIS — I6529 Occlusion and stenosis of unspecified carotid artery: Secondary | ICD-10-CM

## 2010-11-12 DIAGNOSIS — I779 Disorder of arteries and arterioles, unspecified: Secondary | ICD-10-CM

## 2010-11-16 ENCOUNTER — Encounter: Payer: Self-pay | Admitting: *Deleted

## 2010-11-20 LAB — CBC
HCT: 30 — ABNORMAL LOW
HCT: 30.1 — ABNORMAL LOW
HCT: 32.3 — ABNORMAL LOW
Hemoglobin: 11.2 — ABNORMAL LOW
MCHC: 34.4
MCHC: 34.7
MCV: 90.9
MCV: 91.4
MCV: 91.5
MCV: 91.7
Platelets: 161
Platelets: 170
Platelets: 172
Platelets: 247
RBC: 3.52 — ABNORMAL LOW
RBC: 3.58 — ABNORMAL LOW
RBC: 3.97
RDW: 12.7
RDW: 12.9
WBC: 11.4 — ABNORMAL HIGH
WBC: 12.3 — ABNORMAL HIGH
WBC: 12.3 — ABNORMAL HIGH
WBC: 14.3 — ABNORMAL HIGH
WBC: 14.8 — ABNORMAL HIGH

## 2010-11-20 LAB — BASIC METABOLIC PANEL
BUN: 4 — ABNORMAL LOW
BUN: 7
CO2: 18 — ABNORMAL LOW
Calcium: 8.3 — ABNORMAL LOW
Chloride: 113 — ABNORMAL HIGH
Creatinine, Ser: 0.7
GFR calc non Af Amer: >60
Glucose, Bld: 87
Glucose, Bld: 94
Potassium: 3.2 — ABNORMAL LOW
Potassium: 3.7

## 2010-11-20 LAB — CK TOTAL AND CKMB (NOT AT ARMC)
CK, MB: 39.1 — ABNORMAL HIGH
CK, MB: 94.6 — ABNORMAL HIGH
Relative Index: 11.3 — ABNORMAL HIGH
Relative Index: 11.3 — ABNORMAL HIGH
Total CK: 834 — ABNORMAL HIGH

## 2010-11-20 LAB — URINALYSIS, ROUTINE W REFLEX MICROSCOPIC
Bilirubin Urine: NEGATIVE
Ketones, ur: NEGATIVE
Nitrite: NEGATIVE
Specific Gravity, Urine: 1.007
Urobilinogen, UA: 0.2
pH: 6

## 2010-11-20 LAB — APTT: aPTT: 27

## 2010-11-20 LAB — COMPREHENSIVE METABOLIC PANEL
ALT: 27
AST: 67 — ABNORMAL HIGH
Alkaline Phosphatase: 57
CO2: 22
Glucose, Bld: 106 — ABNORMAL HIGH
Potassium: 3.4 — ABNORMAL LOW
Sodium: 140
Total Protein: 5.4 — ABNORMAL LOW

## 2010-11-20 LAB — POCT I-STAT, CHEM 8
BUN: 16
Calcium, Ion: 1.15
Creatinine, Ser: 1.2
Glucose, Bld: 88
TCO2: 25

## 2010-11-20 LAB — DIFFERENTIAL
Eosinophils Absolute: 0.1
Lymphs Abs: 4.2 — ABNORMAL HIGH
Neutro Abs: 9.5 — ABNORMAL HIGH
Neutrophils Relative %: 65

## 2010-11-20 LAB — MAGNESIUM: Magnesium: 1.8

## 2010-11-20 LAB — LIPID PANEL
HDL: 42
VLDL: 11

## 2010-11-20 LAB — HEPARIN LEVEL (UNFRACTIONATED)
Heparin Unfractionated: 0.86 — ABNORMAL HIGH
Heparin Unfractionated: <0.1 — ABNORMAL LOW

## 2010-11-20 LAB — CARDIAC PANEL(CRET KIN+CKTOT+MB+TROPI)
CK, MB: 100.8 — ABNORMAL HIGH
CK, MB: 93.7 — ABNORMAL HIGH

## 2010-11-20 LAB — PROTIME-INR: INR: 1

## 2010-12-13 ENCOUNTER — Other Ambulatory Visit (HOSPITAL_COMMUNITY): Payer: Self-pay | Admitting: Obstetrics and Gynecology

## 2010-12-13 DIAGNOSIS — Z1231 Encounter for screening mammogram for malignant neoplasm of breast: Secondary | ICD-10-CM

## 2011-01-04 ENCOUNTER — Ambulatory Visit (HOSPITAL_COMMUNITY)
Admission: RE | Admit: 2011-01-04 | Discharge: 2011-01-04 | Disposition: A | Discharge status: Home / Self Care | Payer: Commercial Managed Care - PPO | Source: Ambulatory Visit | Attending: Obstetrics and Gynecology | Admitting: Obstetrics and Gynecology

## 2011-01-04 DIAGNOSIS — Z1231 Encounter for screening mammogram for malignant neoplasm of breast: Secondary | ICD-10-CM | POA: Insufficient documentation

## 2011-04-09 ENCOUNTER — Other Ambulatory Visit: Payer: Self-pay | Admitting: Obstetrics and Gynecology

## 2011-04-12 ENCOUNTER — Other Ambulatory Visit: Payer: Self-pay | Admitting: Obstetrics and Gynecology

## 2011-04-12 DIAGNOSIS — R9430 Abnormal result of cardiovascular function study, unspecified: Secondary | ICD-10-CM

## 2011-04-12 DIAGNOSIS — E785 Hyperlipidemia, unspecified: Secondary | ICD-10-CM

## 2011-04-12 DIAGNOSIS — E669 Obesity, unspecified: Secondary | ICD-10-CM

## 2011-04-12 DIAGNOSIS — I2510 Atherosclerotic heart disease of native coronary artery without angina pectoris: Secondary | ICD-10-CM

## 2011-04-26 ENCOUNTER — Encounter: Payer: Self-pay | Admitting: Cardiology

## 2011-04-30 ENCOUNTER — Ambulatory Visit: Payer: Commercial Managed Care - PPO | Admitting: Cardiology

## 2011-05-06 ENCOUNTER — Other Ambulatory Visit: Payer: Self-pay | Admitting: Cardiology

## 2011-05-06 ENCOUNTER — Ambulatory Visit: Payer: Commercial Managed Care - PPO | Admitting: Cardiology

## 2011-05-31 ENCOUNTER — Ambulatory Visit (INDEPENDENT_AMBULATORY_CARE_PROVIDER_SITE_OTHER): Payer: 59 | Admitting: Cardiology

## 2011-05-31 ENCOUNTER — Encounter: Payer: Self-pay | Admitting: Cardiology

## 2011-05-31 VITALS — BP 124/70 | HR 64 | Ht 60.0 in | Wt 172.0 lb

## 2011-05-31 DIAGNOSIS — I779 Disorder of arteries and arterioles, unspecified: Secondary | ICD-10-CM

## 2011-05-31 DIAGNOSIS — I2510 Atherosclerotic heart disease of native coronary artery without angina pectoris: Secondary | ICD-10-CM

## 2011-05-31 DIAGNOSIS — E8779 Other fluid overload: Secondary | ICD-10-CM

## 2011-05-31 NOTE — Patient Instructions (Signed)
Your physician wants you to follow-up in: 6 months.   You will receive a reminder letter in the mail two months in advance. If you don't receive a letter, please call our office to schedule the follow-up appointment.  Your physician has requested that you have a carotid duplex in Sept. This test is an ultrasound of the carotid arteries in your neck. It looks at blood flow through these arteries that supply the brain with blood. Allow one hour for this exam. There are no restrictions or special instructions.

## 2011-05-31 NOTE — Assessment & Plan Note (Signed)
Coronary disease is stable. No change in therapy. 

## 2011-05-31 NOTE — Assessment & Plan Note (Signed)
She's had some slight edema but does not appear to be volume overloaded. She'll watch her salt and fluid intake more carefully.

## 2011-05-31 NOTE — Progress Notes (Signed)
HPI Patient is seen for follow up of coronary disease. She's been stable. She's not having any chest pain. She's had some slight edema during the daytime but it goes away at night. We're following her carotids. Her last Doppler was done September, 2012, that showed stable disease.  Allergies  Allergen Reactions  . Erythromycin   . Penicillins     Current Outpatient Prescriptions  Medication Sig Dispense Refill  . acetaminophen (TYLENOL) 500 MG tablet Take 1,000 mg by mouth every 6 (six) hours as needed.        [name] aspirin 325 MG EC tablet Take 325 mg by mouth daily.        . cholecalciferol (VITAMIN D) 1000 UNITS tablet Take 1,000 Units by mouth daily.        . CRESTOR 20 MG tablet TAKE ONE TABLET BY MOUTH DAILY.  90 tablet  2  . estradiol (CLIMARA - DOSED IN MG/24 HR) 0.05 mg/24hr Place 1 patch onto the skin once a week.        . Meloxicam (MOBIC PO) Take by mouth as needed.        . metoprolol tartrate (LOPRESSOR) 25 MG tablet 1/2 tab bid      . nitroGLYCERIN (NITROSTAT) 0.4 MG SL tablet Place 0.4 mg under the tongue every 5 (five) minutes as needed.        . pramipexole (MIRAPEX) 0.125 MG tablet Take 0.125 mg by mouth at bedtime.        . progesterone (PROMETRIUM) 100 MG capsule Take 100 mg by mouth daily.        . ramipril (ALTACE) 5 MG capsule TAKE 1 CAPSULE BY MOUTH DAILY  90 capsule  2  . sertraline (ZOLOFT) 50 MG tablet Take 50 mg by mouth daily.          History   Social History  . Marital Status: Divorced    Spouse Name: N/A    Number of Children: N/A  . Years of Education: N/A   Occupational History  . Not on file.   Social History Main Topics  . Smoking status: Never Smoker   . Smokeless tobacco: Not on file  . Alcohol Use: Yes     occasionally drinks  . Drug Use: No  . Sexually Active:    Other Topics Concern  . Not on file   Social History Narrative  . No narrative on file    Family History  Problem Relation Age of Onset  . Heart attack Father 58      Past Medical History  Diagnosis Date  . Carotid artery disease     40-59% bilateral, doppler December 2010  . Shellfish allergy   . PVC (premature ventricular contraction)     no significant arrhythmias  . Aortic valve sclerosis     echo 12/2008, soft systolic murmur  . Dyslipidemia   . Coronary artery disease     PCI mid RCA BMS, 11/2007 / stress echo 02/2009 subtle inferior hypokenisis with stress/ cath Jan 2011, RCA stent patent, severe but stable stenosis of distal posterior lateral branch RCA. LV normal, med tx unless more sx   . Right hip pain 12/2009  . CAD (coronary artery disease)     Bare metal stent, RCA, October, 2009 /  subtle abnormality on stress study followed by catheterization, January, 2011, stent patent , there is stable stenosis of distal postero-lateral branch of the RCA, LV function good  . Ejection fraction     EF  normal, catheterization, 2011 /  EF 55%, echo, 2010  . Fluid overload     Mild, stable since hospitalization in the past  . Depression     Mild, August, 2012    No past surgical history on file.  ROS  Patient denies fever, chills, headache, sweats, rash, change in vision, change in hearing, chest pain, cough, nausea vomiting, urinary symptoms. All other systems are reviewed and are negative.  PHYSICAL EXAM Patient is oriented to person time and place. Affect is normal. Head is atraumatic. There is no xanthelasma. There is no jugulovenous distention. Lungs are clear. Respiratory effort is nonlabored. Cardiac exam reveals S1 and S2. There no clicks or significant murmurs. The abdomen is soft. There is no peripheral edema.  Filed Vitals:   05/31/11 1549  BP: 124/70  Pulse: 64  Height: 5' (1.524 m)  Weight: 172 lb (78.019 kg)   EKG is done today and reviewed by me. She has sinus rhythm. There is no significant change.  ASSESSMENT & PLAN

## 2011-05-31 NOTE — Assessment & Plan Note (Addendum)
She will have a followup Doppler in September.

## 2011-06-17 ENCOUNTER — Encounter: Payer: 59 | Attending: Obstetrics and Gynecology | Admitting: *Deleted

## 2011-06-17 ENCOUNTER — Encounter: Payer: Self-pay | Admitting: *Deleted

## 2011-06-17 DIAGNOSIS — E669 Obesity, unspecified: Secondary | ICD-10-CM | POA: Insufficient documentation

## 2011-06-17 DIAGNOSIS — Z713 Dietary counseling and surveillance: Secondary | ICD-10-CM | POA: Insufficient documentation

## 2011-06-17 NOTE — Patient Instructions (Signed)
Plan: Aim for 2-3 Carb choices (30-45 grams) per meal, 0-1 per snack if hungry Read Food Labels for Total Carb and Fat grams  Pick up Calorie Brooke Dare Book for reference info  Consider time of day and type of activity to incorporate daily

## 2011-06-17 NOTE — Progress Notes (Signed)
  Medical Nutrition Therapy:  Appt start time: 0730 end time:  0830.  Assessment:  Primary concerns today: patient here for assistance with weight loss. 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 Lives with her partner and her 63 year old daughter who prepare meals often at home, however many evening meals are fast food for convenience. She enjoys ball room dancing twice a week and yard work when weather is nice. She has attended Weight Watchers many times but does not find she is successful with that program.  MEDICATIONS: see list   DIETARY INTAKE:  Usual eating pattern includes 3 meals and 0-1 snacks per day.  Everyday foods include good variety of most food groups except milk.  Avoided foods include none stated.    24-hr recall:  B ( AM):  [place] yogurt OR Kelloggs flatbread sandwich, coffee black  Snk ( AM): none  L ( PM): cafeteria salad OR hummus vegetables OR soup diet Coke Snk ( PM): when first gets home, may have  wheat thins with cheese, glass wine and then skip dinner D ( PM): lean meat, starch, veg OR Fast Food: Chipotle tacos OR pizza OR chinese food OR 2 hot dogs with bun and chili, slaw, onion Snk ( PM):Beverages: coffee, diet coke, red wine, water  Usual physical activity: ball room dancing twice a week, yard work, gardening  Estimated energy needs: 1200-1400 calories 140 g carbohydrates 100 g protein 35 g fat  Progress Towards Goal(s):  In progress.   Nutritional Diagnosis:  NI-1.5 Excessive energy intake As related to activity level.  As evidenced by BMI of 34%.    Intervention:  Nutrition counseling provided including calorie content of macro-nutrients and healthy balance of each. Emphasized higher calorie content of fats and discussed ways to decrease fat intake for weight loss efforts. Also discussed options for increasing activity level daily. Plan: Aim for 2-3 Carb choices (30-45 grams) per meal, 0-1 per snack if hungry Read  Food Labels for Total Carb and Fat grams  Pick up Calorie Brooke Dare Book for reference info  Consider time of day and type of activity to incorporate daily  Handouts given during visit include:  Carb Counting and Reading Food Labels handouts  Meal Plan Card  Monitoring/Evaluation:  Dietary intake, exercise, reading food labels, and body weight in 2 week(s).

## 2011-07-02 ENCOUNTER — Encounter: Payer: 59 | Attending: Obstetrics and Gynecology | Admitting: *Deleted

## 2011-07-02 DIAGNOSIS — E669 Obesity, unspecified: Secondary | ICD-10-CM | POA: Insufficient documentation

## 2011-07-02 DIAGNOSIS — Z713 Dietary counseling and surveillance: Secondary | ICD-10-CM | POA: Insufficient documentation

## 2011-07-02 NOTE — Progress Notes (Signed)
  Medical Nutrition Therapy:  Appt start time: 0730 end time:  0800.  Assessment:  Primary concerns today: Patient here for follow up visit for weight loss. Weight loss of 1 pound noted. She states she is much more aware of carb choices, is reading food labels more and has decreased her carb choices moderateley since last visit. She continues to enjoy ball room dancing and plans to spend more time in the yard after dinner as the weather warms up.  MEDICATIONS: see list   DIETARY INTAKE:  Usual eating pattern includes 3 meals and 0-1 snacks per day.  Everyday foods include good variety of most food groups except milk.  Avoided foods include none stated.    24-hr recall:  B ( AM):  [place] yogurt OR Kelloggs flatbread sandwich, coffee black  Snk ( AM): none  L ( PM): cafeteria menu based on 2-3 Carb Choices per meal Snk ( PM): when first gets home, may have  wheat thins with cheese, glass wine and then skip dinner D ( PM): lean meat, starch, veg OR Fast Food:less often now Snk ( PM):Beverages: coffee, diet coke, red wine, water  Usual physical activity: ball room dancing twice a week, yard work, gardening  Estimated energy needs: 1200-1400 calories 140 g carbohydrates 100 g protein 35 g fat  Progress Towards Goal(s):  In progress.   Nutritional Diagnosis:  NI-1.5 Excessive energy intake As related to activity level.  As evidenced by BMI of 34%.    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  Monitoring/Evaluation:  Dietary intake, exercise,  reading food labels, and body weight PRN.

## 2011-07-31 ENCOUNTER — Telehealth: Payer: Self-pay

## 2011-07-31 NOTE — Telephone Encounter (Signed)
Rx was faxed today for progesterone 100mg  approved by [facility].

## 2011-11-15 ENCOUNTER — Encounter (INDEPENDENT_AMBULATORY_CARE_PROVIDER_SITE_OTHER): Payer: 59

## 2011-11-15 DIAGNOSIS — I779 Disorder of arteries and arterioles, unspecified: Secondary | ICD-10-CM

## 2011-11-15 DIAGNOSIS — I6529 Occlusion and stenosis of unspecified carotid artery: Secondary | ICD-10-CM

## 2011-11-19 ENCOUNTER — Encounter: Payer: Self-pay | Admitting: Cardiology

## 2011-12-02 ENCOUNTER — Ambulatory Visit (INDEPENDENT_AMBULATORY_CARE_PROVIDER_SITE_OTHER): Payer: 59 | Admitting: Cardiology

## 2011-12-02 ENCOUNTER — Encounter: Payer: Self-pay | Admitting: Cardiology

## 2011-12-02 VITALS — BP 131/78 | HR 59 | Ht 60.0 in | Wt 161.0 lb

## 2011-12-02 DIAGNOSIS — I779 Disorder of arteries and arterioles, unspecified: Secondary | ICD-10-CM

## 2011-12-02 DIAGNOSIS — E8779 Other fluid overload: Secondary | ICD-10-CM

## 2011-12-02 DIAGNOSIS — I2510 Atherosclerotic heart disease of native coronary artery without angina pectoris: Secondary | ICD-10-CM

## 2011-12-02 DIAGNOSIS — I359 Nonrheumatic aortic valve disorder, unspecified: Secondary | ICD-10-CM

## 2011-12-02 DIAGNOSIS — I358 Other nonrheumatic aortic valve disorders: Secondary | ICD-10-CM

## 2011-12-02 DIAGNOSIS — E8770 Fluid overload, unspecified: Secondary | ICD-10-CM

## 2011-12-02 DIAGNOSIS — E785 Hyperlipidemia, unspecified: Secondary | ICD-10-CM

## 2011-12-02 NOTE — Progress Notes (Signed)
HPI  Patient is seen for followup coronary disease. She is really doing well. I saw her 6 months ago. She received a bare-metal stent in 2009. Catheterization in 2011 revealed a patent stent. There was stenosis of the distal posterior lateral branch of the RCA. It was significant but it was felt that medical therapy was most appropriate unless she had significant symptoms. She has been stable. She also has carotid disease. We follow this carefully. Her most recent Doppler showed that she has stable 40-59% bilateral disease. I explained this to her.  She's not having any significant chest pain or shortness of breath.  Allergies  Allergen Reactions  . Erythromycin   . Penicillins     Current Outpatient Prescriptions  Medication Sig Dispense Refill  . acetaminophen (TYLENOL) 500 MG tablet Take 1,000 mg by mouth every 6 (six) hours as needed.        [name] aspirin 325 MG EC tablet Take 325 mg by mouth daily.        . CRESTOR 20 MG tablet TAKE ONE TABLET BY MOUTH DAILY.  90 tablet  2  . estradiol (CLIMARA - DOSED IN MG/24 HR) 0.05 mg/24hr Place 1 patch onto the skin once a week.        . Meloxicam (MOBIC PO) Take by mouth as needed.        . metoprolol tartrate (LOPRESSOR) 25 MG tablet 1/2 tab bid      . nitroGLYCERIN (NITROSTAT) 0.4 MG SL tablet Place 0.4 mg under the tongue every 5 (five) minutes as needed.        . pramipexole (MIRAPEX) 0.125 MG tablet Take 0.125 mg by mouth at bedtime.        . progesterone (PROMETRIUM) 100 MG capsule Take 100 mg by mouth daily.        . ramipril (ALTACE) 5 MG capsule TAKE 1 CAPSULE BY MOUTH DAILY  90 capsule  2  . sertraline (ZOLOFT) 50 MG tablet Take 50 mg by mouth daily.          History   Social History  . Marital Status: Divorced    Spouse Name: N/A    Number of Children: N/A  . Years of Education: N/A   Occupational History  . Not on file.   Social History Main Topics  . Smoking status: Never Smoker   . Smokeless tobacco: Never Used  .  Alcohol Use: Yes     occasionally drinks  . Drug Use: No  . Sexually Active: Not on file   Other Topics Concern  . Not on file   Social History Narrative  . No narrative on file    Family History  Problem Relation Age of Onset  . Heart attack Father 57    Past Medical History  Diagnosis Date  . Carotid artery disease     40-59% bilateral, doppler December 2010  . Shellfish allergy   . PVC (premature ventricular contraction)     no significant arrhythmias  . Aortic valve sclerosis     echo 12/2008, soft systolic murmur  . Dyslipidemia   . Coronary artery disease     PCI mid RCA BMS, 11/2007 / stress echo 02/2009 subtle inferior hypokenisis with stress/ cath Jan 2011, RCA stent patent, severe but stable stenosis of distal posterior lateral branch RCA. LV normal, med tx unless more sx   . Right hip pain 12/2009  . CAD (coronary artery disease)     Bare metal stent, RCA, October, 2009 /  subtle abnormality on stress study followed by catheterization, January, 2011, stent patent , there is stable stenosis of distal postero-lateral branch of the RCA, LV function good  . Ejection fraction     EF normal, catheterization, 2011 /  EF 55%, echo, 2010  . Fluid overload     Mild, stable since hospitalization in the past  . Depression     Mild, August, 2012    No past surgical history on file.  Patient Active Problem List  Diagnosis  . Carotid artery disease  . PVC (premature ventricular contraction)  . Aortic valve sclerosis  . Dyslipidemia  . Coronary artery disease  . CAD (coronary artery disease)  . Ejection fraction  . Fluid overload  . Depression    ROS  Patient denies fever, chills, headache, sweats, rash, change in vision, change in hearing, chest pain, cough, nausea vomiting, urinary symptoms. All other systems are reviewed and are negative.  PHYSICAL EXAM  Patient is oriented to person time and place. Affect is normal. There is no jugulovenous distention. Lungs  are clear. Respiratory effort is nonlabored. Cardiac exam reveals S1 and S2. There is a very soft systolic murmur. The abdomen is soft. Is no peripheral edema. Filed Vitals:   12/02/11 1558  BP: 131/78  Pulse: 59  Height: 5' (1.524 m)  Weight: 161 lb (73.029 kg)   EKG is done today and reviewed by me. There sinus rhythm. The EKG is normal.  ASSESSMENT & PLAN

## 2011-12-02 NOTE — Assessment & Plan Note (Signed)
Patient watches her salt and fluid carefully. Her volume status is stable. I'll see her back in one year for followup.

## 2011-12-02 NOTE — Patient Instructions (Addendum)
Your physician wants you to follow-up in: 1 year. You will receive a reminder letter in the mail two months in advance. If you don't receive a letter, please call our office to schedule the follow-up appointment.  

## 2011-12-02 NOTE — Assessment & Plan Note (Signed)
She has a soft systolic murmur consistent with her aortic valve sclerosis. No further workup is needed at this time.

## 2011-12-02 NOTE — Assessment & Plan Note (Signed)
Carotid disease is being watched very carefully. Her Doppler in October, 2013 is stable. She is on good secondary prevention. No change in therapy.

## 2011-12-02 NOTE — Assessment & Plan Note (Addendum)
Her lipids are being treated. No change in therapy. 

## 2011-12-02 NOTE — Assessment & Plan Note (Signed)
Coronary disease is stable. No change in therapy. No testing is needed at this time.

## 2011-12-03 ENCOUNTER — Encounter: Payer: Self-pay | Admitting: Obstetrics and Gynecology

## 2011-12-09 ENCOUNTER — Other Ambulatory Visit: Payer: Self-pay | Admitting: Cardiology

## 2012-01-09 ENCOUNTER — Other Ambulatory Visit: Payer: Self-pay | Admitting: Obstetrics and Gynecology

## 2012-01-09 DIAGNOSIS — Z1231 Encounter for screening mammogram for malignant neoplasm of breast: Secondary | ICD-10-CM

## 2012-01-27 ENCOUNTER — Ambulatory Visit (HOSPITAL_COMMUNITY): Payer: 59

## 2012-01-28 ENCOUNTER — Ambulatory Visit (HOSPITAL_COMMUNITY)
Admission: RE | Admit: 2012-01-28 | Discharge: 2012-01-28 | Disposition: A | Discharge status: Home / Self Care | Payer: 59 | Source: Ambulatory Visit | Attending: Obstetrics and Gynecology | Admitting: Obstetrics and Gynecology

## 2012-01-28 DIAGNOSIS — Z1231 Encounter for screening mammogram for malignant neoplasm of breast: Secondary | ICD-10-CM | POA: Insufficient documentation

## 2012-01-31 ENCOUNTER — Encounter: Payer: Self-pay | Admitting: Obstetrics and Gynecology

## 2012-02-18 ENCOUNTER — Other Ambulatory Visit: Payer: Self-pay

## 2012-02-18 MED ORDER — ROSUVASTATIN CALCIUM 20 MG PO TABS: 20.0000 mg | ORAL_TABLET | Freq: Every day | ORAL | Status: DC

## 2012-02-18 MED ORDER — METOPROLOL TARTRATE 25 MG PO TABS: ORAL_TABLET | ORAL | Status: DC

## 2012-02-18 MED ORDER — RAMIPRIL 5 MG PO CAPS: 5.0000 mg | ORAL_CAPSULE | Freq: Every day | ORAL | Status: DC

## 2012-04-04 ENCOUNTER — Other Ambulatory Visit: Payer: Self-pay

## 2012-06-04 ENCOUNTER — Other Ambulatory Visit: Payer: Self-pay | Admitting: *Deleted

## 2012-06-04 MED ORDER — RAMIPRIL 5 MG PO CAPS: 5.0000 mg | ORAL_CAPSULE | Freq: Every day | ORAL | Status: DC

## 2012-08-26 ENCOUNTER — Emergency Department (HOSPITAL_COMMUNITY): Payer: 59

## 2012-08-26 ENCOUNTER — Encounter (HOSPITAL_COMMUNITY): Payer: Self-pay | Admitting: Emergency Medicine

## 2012-08-26 ENCOUNTER — Observation Stay (HOSPITAL_COMMUNITY)
Admission: EM | Admit: 2012-08-26 | Discharge: 2012-08-27 | Disposition: A | Discharge status: Home / Self Care | Payer: 59 | Attending: Cardiology | Admitting: Cardiology

## 2012-08-26 DIAGNOSIS — I358 Other nonrheumatic aortic valve disorders: Secondary | ICD-10-CM | POA: Diagnosis present

## 2012-08-26 DIAGNOSIS — R5381 Other malaise: Secondary | ICD-10-CM | POA: Insufficient documentation

## 2012-08-26 DIAGNOSIS — R0602 Shortness of breath: Secondary | ICD-10-CM | POA: Insufficient documentation

## 2012-08-26 DIAGNOSIS — I2510 Atherosclerotic heart disease of native coronary artery without angina pectoris: Principal | ICD-10-CM | POA: Diagnosis present

## 2012-08-26 DIAGNOSIS — I209 Angina pectoris, unspecified: Secondary | ICD-10-CM | POA: Insufficient documentation

## 2012-08-26 DIAGNOSIS — I779 Disorder of arteries and arterioles, unspecified: Secondary | ICD-10-CM | POA: Diagnosis present

## 2012-08-26 DIAGNOSIS — I4949 Other premature depolarization: Secondary | ICD-10-CM | POA: Insufficient documentation

## 2012-08-26 DIAGNOSIS — R079 Chest pain, unspecified: Secondary | ICD-10-CM | POA: Insufficient documentation

## 2012-08-26 DIAGNOSIS — I359 Nonrheumatic aortic valve disorder, unspecified: Secondary | ICD-10-CM | POA: Insufficient documentation

## 2012-08-26 DIAGNOSIS — E782 Mixed hyperlipidemia: Secondary | ICD-10-CM | POA: Diagnosis present

## 2012-08-26 DIAGNOSIS — Z9861 Coronary angioplasty status: Secondary | ICD-10-CM | POA: Insufficient documentation

## 2012-08-26 DIAGNOSIS — I495 Sick sinus syndrome: Secondary | ICD-10-CM

## 2012-08-26 DIAGNOSIS — R001 Bradycardia, unspecified: Secondary | ICD-10-CM

## 2012-08-26 DIAGNOSIS — I498 Other specified cardiac arrhythmias: Secondary | ICD-10-CM | POA: Insufficient documentation

## 2012-08-26 DIAGNOSIS — E785 Hyperlipidemia, unspecified: Secondary | ICD-10-CM | POA: Diagnosis present

## 2012-08-26 DIAGNOSIS — R5383 Other fatigue: Secondary | ICD-10-CM

## 2012-08-26 DIAGNOSIS — Z79899 Other long term (current) drug therapy: Secondary | ICD-10-CM | POA: Insufficient documentation

## 2012-08-26 DIAGNOSIS — I6529 Occlusion and stenosis of unspecified carotid artery: Secondary | ICD-10-CM | POA: Insufficient documentation

## 2012-08-26 LAB — TROPONIN I
Troponin I: <0.3 ng/mL (ref <0.30)
Troponin I: <0.3 ng/mL (ref <0.30)

## 2012-08-26 LAB — CBC
HCT: 36.8 % (ref 36.0–46.0)
Hemoglobin: 13 g/dL (ref 12.0–15.0)
MCH: 31.6 pg (ref 26.0–34.0)
MCH: 31.5 pg (ref 26.0–34.0)
MCHC: 35.3 g/dL (ref 30.0–36.0)
MCHC: 35.1 g/dL (ref 30.0–36.0)
MCV: 89.3 fL (ref 78.0–100.0)
RDW: 12.4 % (ref 11.5–15.5)

## 2012-08-26 LAB — BASIC METABOLIC PANEL
BUN: 22 mg/dL (ref 6–23)
Chloride: 104 mEq/L (ref 96–112)
Creatinine, Ser: 0.87 mg/dL (ref 0.50–1.10)
Glucose, Bld: 91 mg/dL (ref 70–99)
Potassium: 4.3 mEq/L (ref 3.5–5.1)

## 2012-08-26 LAB — T4, FREE: Free T4: 1.09 ng/dL (ref 0.80–1.80)

## 2012-08-26 LAB — MAGNESIUM: Magnesium: 2.2 mg/dL (ref 1.5–2.5)

## 2012-08-26 LAB — TSH: TSH: 0.876 u[IU]/mL (ref 0.350–4.500)

## 2012-08-26 LAB — CREATININE, SERUM
Creatinine, Ser: 0.86 mg/dL (ref 0.50–1.10)
GFR calc non Af Amer: 70 mL/min — ABNORMAL LOW (ref >90)

## 2012-08-26 MED ORDER — ACETAMINOPHEN 500 MG PO TABS
1000.0000 mg | ORAL_TABLET | Freq: Four times a day (QID) | ORAL | Status: DC | PRN
  Administered 2012-08-26: 1000 mg via ORAL
  Filled 2012-08-26 (×5): qty 2

## 2012-08-26 MED ORDER — SERTRALINE HCL 50 MG PO TABS
50.0000 mg | ORAL_TABLET | Freq: Every day | ORAL | Status: DC
  Filled 2012-08-26: qty 1

## 2012-08-26 MED ORDER — SODIUM CHLORIDE 0.9 % IJ SOLN: 3.0000 mL | INTRAMUSCULAR | Status: DC | PRN

## 2012-08-26 MED ORDER — ENOXAPARIN SODIUM 40 MG/0.4ML ~~LOC~~ SOLN
40.0000 mg | SUBCUTANEOUS | Status: DC
  Filled 2012-08-26 (×2): qty 0.4

## 2012-08-26 MED ORDER — NITROGLYCERIN 0.4 MG SL SUBL: 0.4000 mg | SUBLINGUAL_TABLET | SUBLINGUAL | Status: DC | PRN

## 2012-08-26 MED ORDER — RAMIPRIL 5 MG PO CAPS
5.0000 mg | ORAL_CAPSULE | Freq: Every day | ORAL | Status: DC
  Filled 2012-08-26: qty 1

## 2012-08-26 MED ORDER — ALPRAZOLAM 0.25 MG PO TABS: 0.2500 mg | ORAL_TABLET | Freq: Two times a day (BID) | ORAL | Status: DC | PRN

## 2012-08-26 MED ORDER — ATORVASTATIN CALCIUM 40 MG PO TABS
40.0000 mg | ORAL_TABLET | Freq: Every day | ORAL | Status: DC
  Administered 2012-08-26: 40 mg via ORAL
  Filled 2012-08-26 (×3): qty 1

## 2012-08-26 MED ORDER — ZOLPIDEM TARTRATE 5 MG PO TABS: 5.0000 mg | ORAL_TABLET | Freq: Every evening | ORAL | Status: DC | PRN

## 2012-08-26 MED ORDER — ASPIRIN EC 81 MG PO TBEC
81.0000 mg | DELAYED_RELEASE_TABLET | Freq: Every day | ORAL | Status: DC
  Filled 2012-08-26: qty 1

## 2012-08-26 MED ORDER — SODIUM CHLORIDE 0.9 % IJ SOLN
3.0000 mL | Freq: Two times a day (BID) | INTRAMUSCULAR | Status: DC
  Administered 2012-08-26: 3 mL via INTRAVENOUS

## 2012-08-26 MED ORDER — ONDANSETRON HCL 4 MG/2ML IJ SOLN: 4.0000 mg | Freq: Four times a day (QID) | INTRAMUSCULAR | Status: DC | PRN

## 2012-08-26 MED ORDER — SODIUM CHLORIDE 0.9 % IV SOLN: 250.0000 mL | INTRAVENOUS | Status: DC | PRN

## 2012-08-26 NOTE — ED Provider Notes (Signed)
9:53 AM  Date: 08/26/2012  Rate: 48  Rhythm: sinus bradycardia and premature ventricular contractions (PVC)  QRS Axis: normal  Intervals: QT prolonged  ST/T Wave abnormalities: normal  Conduction Disutrbances:none  Narrative Interpretation: Sinus bradycardia, otherwise normal.    Old EKG Reviewed: unchanged    Carleene Cooper III, MD 08/26/12 [id number]

## 2012-08-26 NOTE — ED Provider Notes (Signed)
History    CSN: [id number] Arrival date & time 08/26/12  0944  First MD Initiated Contact with Patient 08/26/12 [phone]     Chief Complaint  Patient presents with  . Chest Pain   (Consider location/radiation/quality/duration/timing/severity/associated sxs/prior Treatment) HPI Comments: Patient is a 62 year old female with a past medical history of previous MI in 2009, CAD, dyslipidemia who presents with central chest discomfort that has been present for the past 5 days but acutely worsened prior to arrival. Patient reports a moderate pressure-like discomfort without radiation. Patient reports feeling associated fatigue, as she would say "feeling drained" which is exactly how she felt with her last MI in 2009. Patient denies any other associated symptoms. No aggravating/alleviating factors. Patient took 324mg  ASA at home. Patient is not a smoker.   Past Medical History  Diagnosis Date  . Carotid artery disease     40-59% bilateral, doppler December 2010  . Shellfish allergy   . PVC (premature ventricular contraction)     no significant arrhythmias  . Aortic valve sclerosis     echo 12/2008, soft systolic murmur  . Dyslipidemia   . Coronary artery disease     PCI mid RCA BMS, 11/2007 / stress echo 02/2009 subtle inferior hypokenisis with stress/ cath Jan 2011, RCA stent patent, severe but stable stenosis of distal posterior lateral branch RCA. LV normal, med tx unless more sx   . Right hip pain 12/2009  . CAD (coronary artery disease)     Bare metal stent, RCA, October, 2009 /  subtle abnormality on stress study followed by catheterization, January, 2011, stent patent , there is stable stenosis of distal postero-lateral branch of the RCA, LV function good  . Ejection fraction     EF normal, catheterization, 2011 /  EF 55%, echo, 2010  . Fluid overload     Mild, stable since hospitalization in the past  . Depression     Mild, August, 2012   History reviewed. No pertinent past surgical  history. Family History  Problem Relation Age of Onset  . Heart attack Father 12   History  Substance Use Topics  . Smoking status: Never Smoker   . Smokeless tobacco: Never Used  . Alcohol Use: Yes     Comment: occasionally drinks   OB History   Grav Para Term Preterm Abortions TAB SAB Ect Mult Living                 Review of Systems  Constitutional: Positive for fatigue.  Cardiovascular: Positive for chest pain and palpitations.  All other systems reviewed and are negative.    Allergies  Erythromycin and Penicillins  Home Medications   Current Outpatient Rx  Name  Route  Sig  Dispense  Refill  . acetaminophen (TYLENOL) 500 MG tablet   Oral   Take 1,000 mg by mouth every 6 (six) hours as needed.           [name] aspirin 325 MG EC tablet   Oral   Take 325 mg by mouth daily.           [name] estradiol (CLIMARA - DOSED IN MG/24 HR) 0.05 mg/24hr   Transdermal   Place 1 patch onto the skin once a week.           . Meloxicam (MOBIC PO)   Oral   Take by mouth as needed.           . metoprolol tartrate (LOPRESSOR) 25 MG tablet  TAKE 1/2 TABLET TWICE DAILY   90 tablet   1   . NITROSTAT 0.4 MG SL tablet      DISSOLVE 1 TABLET UNDER THE TONGUE AT ONSET OF CHEST PAIN, MAY REPEAT EVERY 5 MINUTES FOR UP TO 3 DOSES   25 tablet   7   . pramipexole (MIRAPEX) 0.125 MG tablet   Oral   Take 0.125 mg by mouth at bedtime.           . progesterone (PROMETRIUM) 100 MG capsule   Oral   Take 100 mg by mouth daily.           . ramipril (ALTACE) 5 MG capsule   Oral   Take 1 capsule (5 mg total) by mouth daily.   90 capsule   1   . rosuvastatin (CRESTOR) 20 MG tablet   Oral   Take 1 tablet (20 mg total) by mouth daily.   90 tablet   1   . sertraline (ZOLOFT) 50 MG tablet   Oral   Take 50 mg by mouth daily.            BP 120/59  Pulse 53  Temp(Src) 98 F (36.7 C) (Oral)  Resp 11  SpO2 100% Physical Exam  Nursing note and vitals  reviewed. Constitutional: She is oriented to person, place, and time. She appears well-developed and well-nourished. No distress.  HENT:  Head: Normocephalic and atraumatic.  Eyes: Conjunctivae and EOM are normal.  Neck: Normal range of motion.  Cardiovascular: Normal rate and regular rhythm.  Exam reveals no gallop and no friction rub.   No murmur heard. Pulmonary/Chest: Effort normal and breath sounds normal. She has no wheezes. She has no rales. She exhibits no tenderness.  Abdominal: Soft. She exhibits no distension. There is no tenderness. There is no rebound and no guarding.  Musculoskeletal: Normal range of motion.  Neurological: She is alert and oriented to person, place, and time. Coordination normal.  Speech is goal-oriented. Moves limbs without ataxia.   Skin: Skin is warm and dry.  Psychiatric: She has a normal mood and affect. Her behavior is normal.    ED Course  Procedures (including critical care time) Labs Reviewed  BASIC METABOLIC PANEL - Abnormal; Notable for the following:    GFR calc non Af Amer 69 (*)    GFR calc Af Amer 80 (*)    All other components within normal limits  CBC  POCT I-STAT TROPONIN I   Dg Chest 2 View  08/26/2012   *RADIOLOGY REPORT*  Clinical Data: Chest pain.  Remote myocardial infarction.  CHEST - 2 VIEW  Comparison: 12/01/2007.  Findings: Lungs clear well-aerated.  No effusion pneumothorax. Normal heart size and mediastinal contours.  Abdominal aortic atherosclerosis.  Lower cervical degenerative facet arthropathy.  IMPRESSION: No evidence of acute cardiopulmonary disease.   Original Report Authenticated By: Tiburcio Pea   1. Chest pain     MDM  9:52 AM Labs pending. Patient received 324mg  ASA en route. Vitals stable and patient afebrile.   10:49 AM First troponin negative. Labs unremarkable. Patient declines pain medication at this time.   Patient admitted to Cardiology.   Emilia Beck, PA-C 08/26/12 1516

## 2012-08-26 NOTE — ED Notes (Signed)
Pt from home, c/o pvcs since Friday. Came to ED today for "felling drained". Pt has hx of atypical MI, states similar presentation. Denies n/v,sob. Pt received 324 asa pta.

## 2012-08-26 NOTE — ED Notes (Signed)
Patient transported to X-ray 

## 2012-08-26 NOTE — H&P (Signed)
History and Physical   Patient ID: Courtney [name] MRN: [id number], DOB/AGE: 64-Apr-1952   Admit date: 08/26/2012 Date of Consult: 08/26/2012   Primary Physician: Astrid Divine, MD Primary Cardiologist: Lovena Neighbours, MD  HPI: Courtney [name] is a 62 y.o. female with PMHx s/f CAD (s/p BMS-RCA in 2009; see below), carotid artery disease (0-39% RICA, 40-59% LICA in 10/2011), AV sclerosis (per 2010 echo), h/o PVCs, h/o fluid overload on prior hospitalization, HLD and shellfish allergy who presents to [facility] ED today c/o chest pain.   She underwent cardiac catheterization 11/2007 in the setting of NSTEMI. She received BMS-RCA; EF 45% and 2+ MR noted at that time. She underwent stress echo 02/2009 in the setting of recurrent chest pain. This revealed lateral ST depressions on stress and inferior HK. Follow-up cardiac catheterization later that month indicated patent mid RCA stent, stenosed PLB supplying a small territory. She was medically managed. EF 55-60%, no MR. She has had previous Holter monitoring for palpitations attributed to PVCs. Overall stable from a cardiac perspective on last follow-up with Lovena Neighbours, MD 11/2011.   She has been in her USOH until ~ 5 days ago when she began experiencing increased palpitations with associated shortness of breath. She notes increased frequency with activity. She prepared for work this morning and packed the car, but became markedly more fatigued described as feeling "drained." She denies frank chest pain during this time, but states this was a very similar constellation of symptoms prior to her last MI. She denies orthopnea, PND, LE edema, lightheadedness or syncope. She denies unilateral leg swelling, redness or pain. She denies new medications. She continues to take metoprolol 12.5mg  BID and has tolerated well for several months. Denies thyroid abnormalities, anemia, active bleeding, sleep apnea, or unintentional weight loss. Denies diabetes or tobacco abuse.  Denies tobacco, EtOH, illicit drugs, supplements or caffeine use. No increased stress. She exercises twice a week without incident.   In the ED, EKG does reveal PVCs, sinus brady (rate mid 40s), TW inversions/flattening V1-V3, otherwise no acute ST/T changes. Unchanged on prior 11/2011 EKG (HR 55 bpm). Initial trop-I WNL. BMET, CBC unremarkable. CXR w/o evidence of acute cardiopulmonary disease.   Problem List: Past Medical History  Diagnosis Date  . Carotid artery disease     40-59% bilateral, doppler December 2010  . Shellfish allergy   . PVC (premature ventricular contraction)     no significant arrhythmias  . Aortic valve sclerosis     echo 12/2008, soft systolic murmur  . Dyslipidemia   . Coronary artery disease     a. BMS-mid RCA 11/2007 b. stress echo 02/2009 subtle inferior HK, lateral ST depressions with stress c. follow-up cath 02/2009: RCA stent patent, severe but stable stenosis of distal posterior lateral branch RCA. LV normal, med tx unless more sx   . Right hip pain 12/2009  . Ejection fraction     EF normal, catheterization, 2011 /  EF 55%, echo, 2010  . Fluid overload     Mild, stable since hospitalization in the past  . Depression     Mild, August, 2012    Past Surgical History  Procedure Laterality Date  . Coronary angioplasty with stent placement  11/2007    BMS-mid RCA  . Cardiac catheterization  1/211    Patent RCA stent, stenotic PLB supplying small distribution; medically managed     Allergies:  Allergies  Allergen Reactions  . Erythromycin   . Penicillins     Home Medications:  Prior to Admission medications   Medication Sig Start Date End Date Taking? Authorizing Provider  acetaminophen (TYLENOL) 500 MG tablet Take 1,000 mg by mouth every 6 (six) hours as needed.     Yes Historical Provider, MD  aspirin 325 MG EC tablet Take 325 mg by mouth daily.     Yes Historical Provider, MD  meloxicam (MOBIC) 7.5 MG tablet Take 7.5 mg by mouth daily as needed  for pain.   Yes Historical Provider, MD  metoprolol tartrate (LOPRESSOR) 25 MG tablet Take 12.5 mg by mouth 2 (two) times daily.   Yes Historical Provider, MD  nitroGLYCERIN (NITROSTAT) 0.4 MG SL tablet Place 0.4 mg under the tongue every 5 (five) minutes as needed for chest pain.   Yes Historical Provider, MD  Probiotic Product (PROBIOTIC DAILY) CAPS Take 1 capsule by mouth daily. Suprema Dophilus   Yes Historical Provider, MD  ramipril (ALTACE) 5 MG capsule Take 1 capsule (5 mg total) by mouth daily. 06/04/12  Yes Peter M [place], MD  rosuvastatin (CRESTOR) 20 MG tablet Take 1 tablet (20 mg total) by mouth daily. 02/18/12  Yes Luis Abed, MD  sertraline (ZOLOFT) 50 MG tablet Take 50 mg by mouth daily.     Yes Historical Provider, MD    Inpatient Medications:    (Not in a hospital admission)  Family History  Problem Relation Age of Onset  . Heart attack Father 29     History   Social History  . Marital Status: Divorced    Spouse Name: N/A    Number of Children: N/A  . Years of Education: N/A   Occupational History  . Not on file.   Social History Main Topics  . Smoking status: Never Smoker   . Smokeless tobacco: Never Used  . Alcohol Use: Yes     Comment: 1 glass of wine/night  . Drug Use: No  . Sexually Active: Not on file   Other Topics Concern  . Not on file   Social History Narrative  . No narrative on file     Review of Systems: General: positive for fatigue, negative for chills, fever, night sweats or weight changes.  Cardiovascular: positive for palpitations and shortness of breath, negative for chest pain, dyspnea on exertion, edema, orthopnea, paroxysmal nocturnal dyspnea Dermatological: negative for rash Respiratory: negative for cough or wheezing Urologic: negative for hematuria Abdominal: negative for nausea, vomiting, diarrhea, bright red blood per rectum, melena, or hematemesis Neurologic: negative for visual changes, syncope, or dizziness All  other systems reviewed and are otherwise negative except as noted above.  Physical Exam: Blood pressure 119/64, pulse 51, temperature 98 F (36.7 C), temperature source Oral, resp. rate 17, SpO2 100.00%.    General: Well developed, well nourished, in no acute distress. Head: Normocephalic, atraumatic, sclera non-icteric, no xanthomas, nares are without discharge.  Neck: Negative for carotid bruits. JVD not elevated. Lungs: Clear bilaterally to auscultation without wheezes, rales, or rhonchi. Breathing is unlabored. Heart: RRR with S1 S2. II/VI systolic crescendo-decrescendo murmur at RUSB, no rubs, or gallops appreciated. Abdomen: Soft, non-tender, non-distended with normoactive bowel sounds. No hepatomegaly. No rebound/guarding. No obvious abdominal masses. Msk:  Strength and tone appears normal for age. Extremities: No clubbing, cyanosis or edema.  Distal pedal pulses are 2+ and equal bilaterally. Neuro: Alert and oriented X 3. Moves all extremities spontaneously. Psych:  Responds to questions appropriately with a normal affect.  Labs: Recent Labs     08/26/12  1010  WBC  6.1  HGB  13.0  HCT  36.8  MCV  89.3  PLT  205   Recent Labs Lab 08/26/12 1010  NA 138  K 4.3  CL 104  CO2 26  BUN 22  CREATININE 0.87  CALCIUM 9.4  GLUCOSE 91   Radiology/Studies: Dg Chest 2 View  08/26/2012   *RADIOLOGY REPORT*  Clinical Data: Chest pain.  Remote myocardial infarction.  CHEST - 2 VIEW  Comparison: 12/01/2007.  Findings: Lungs clear well-aerated.  No effusion pneumothorax. Normal heart size and mediastinal contours.  Abdominal aortic atherosclerosis.  Lower cervical degenerative facet arthropathy.  IMPRESSION: No evidence of acute cardiopulmonary disease.   Original Report Authenticated By: Tiburcio Pea   EKG: NSR, LAE, TWI/flattening V1-V3, occasional PVCs, no ST/T changes  ASSESSMENT AND PLAN:   62 y.o. female with PMHx s/f CAD (s/p BMS-RCA in 2009; see below), carotid artery  disease (0-39% RICA, 40-59% LICA in 10/2011), AV sclerosis (per 2010 echo), h/o fluid overload on prior hospitalization, HLD and shellfish allergy who presents to [facility] ED today c/o chest pain.   1. Fatigue 2. Palpitations  A. PVCs and sinus bradycardia on telemetry (HR mid 40s)  B. History of PVCs on prior Holter monitoring 3. Coronary artery disease  A. S/p BMS-mid RCA 2009 4. Sinus bradycardia  A. Metoprolol tartrate 12.5mg  BID outpatient 5. Carotid artery disease  A. Nonobstructive on 10/2011 carotid dopplers 6. AV sclerosis  A. Demonstrated on 2010 echo  B. II/VI systolic crescendo-decrescendo murmur at RUSB on exam 7. Hyperlipidemia 8. Shellfish allergy   The patient presents to [facility] ED after marked fatigue occurring this morning. She has reported increased frequency of palpitations for the past 5 days with associated shortness of breath. These symptoms are similar to her prior MI. She did not have chest pain at that time. Objectively, she EKG does indicate PVCs and sinus bradycardia. No new ST/T changes. Initial trop-I WNL. She does have a history of CAD requiring PCI in 2009, and disease progression in 2011. The location was a PLB branch supplying a small territory, and was medically managed. She has not had an ischemic eval for several years. Given cardiac history and similar presentation to her prior MI, favor overnight observation with formal rule out. Plan exercise Myoview tomorrow. Update 2D echo with systolic murmur on exam and h/o AV sclerosis. Cycle biomarkers x 2. Obtain TSH, T4, overnight O2 sat, Mg, lipid panel in AM. Hold metoprolol with bradycardia. Monitor rate and ectopy on telemetry.    Signed, R. Hurman Horn, PA-C 08/26/2012, 1:19 PM   I have taken a history, reviewed medications, allergies, PMH, SH, FH, and reviewed ROS and examined the patient.  I agree with the assessment and plan. Discussed with patient who agrees with the plan.  Luise Yamamoto C. Daleen Squibb, MD,  [facility] Pager:  [phone]

## 2012-08-27 ENCOUNTER — Ambulatory Visit: Payer: 59 | Admitting: Physician Assistant

## 2012-08-27 ENCOUNTER — Observation Stay (HOSPITAL_COMMUNITY): Payer: 59

## 2012-08-27 ENCOUNTER — Encounter (HOSPITAL_COMMUNITY): Payer: Self-pay | Admitting: Physician Assistant

## 2012-08-27 DIAGNOSIS — R001 Bradycardia, unspecified: Secondary | ICD-10-CM

## 2012-08-27 DIAGNOSIS — R079 Chest pain, unspecified: Secondary | ICD-10-CM

## 2012-08-27 DIAGNOSIS — R002 Palpitations: Secondary | ICD-10-CM | POA: Insufficient documentation

## 2012-08-27 DIAGNOSIS — R5383 Other fatigue: Secondary | ICD-10-CM

## 2012-08-27 LAB — BASIC METABOLIC PANEL
BUN: 24 mg/dL — ABNORMAL HIGH (ref 6–23)
CO2: 25 mEq/L (ref 19–32)
Chloride: 104 mEq/L (ref 96–112)
Creatinine, Ser: 0.85 mg/dL (ref 0.50–1.10)
GFR calc Af Amer: 83 mL/min — ABNORMAL LOW (ref >90)
Potassium: 4.6 mEq/L (ref 3.5–5.1)

## 2012-08-27 LAB — LIPID PANEL
LDL Cholesterol: 96 mg/dL (ref 0–99)
VLDL: 24 mg/dL (ref 0–40)

## 2012-08-27 MED ORDER — TECHNETIUM TC 99M SESTAMIBI GENERIC - CARDIOLITE
10.0000 | Freq: Once | INTRAVENOUS | Status: AC | PRN
  Administered 2012-08-27: 10 via INTRAVENOUS

## 2012-08-27 MED ORDER — TECHNETIUM TC 99M SESTAMIBI GENERIC - CARDIOLITE
30.0000 | Freq: Once | INTRAVENOUS | Status: AC | PRN
  Administered 2012-08-27: 30 via INTRAVENOUS

## 2012-08-27 NOTE — Discharge Summary (Addendum)
Discharge Summary   Patient ID: Courtney [name],  MRN: [id number], DOB/AGE: December 06, 1950 62 y.o.  Admit date: 08/26/2012 Discharge date: 08/27/2012  Primary Physician: Astrid Divine, MD Primary Cardiologist: Lovena Neighbours, MD  Discharge Diagnoses Principal Problem:   Fatigue  - Anginal equivalent  - Observed overnight, ruled out with TnI WNL x 3  - ETT Myoview negative, no evidence of ischemia, EF 64% Active Problems:   Coronary artery disease  - S/p BMS-mid RCA 2009, PLB stenoses on repeat 2011 cath, medically managed   Carotid artery disease    - Nonobstructive 0-39% RICA, 40-59% LICA on 10/2011 carotid dopplers   Aortic valve sclerosis  - Demonstrated on 2010 echo   - II/VI systolic crescendo-decrescendo murmur at RUSB on exam   Sinus bradycardia  - Asymptomatic  - HR mid 40-50s   PVCs (premature ventricular contractions)  - To resume outpatient Lopressor  - Tolerated well, asymptomatic bradycardia with this  - 48 hour Holter monitor arranged after discharge to assess PVC burden  - Consider EP follow-up for further recs   Dyslipidemia  Allergies Allergies  Allergen Reactions  . Erythromycin   . Penicillins     Diagnostic Studies/Procedures  PA/LATERAL CHEST X-RAY - 08/26/12  IMPRESSION:  No evidence of acute cardiopulmonary disease.  ETT MYOVIEW - 08/27/12  This is interpreted as a negative stress Myoview study. There is no evidence of ischemia. She has normal left ventricular systolic function with an ejection fraction of 64%.  History of Present Illness Courtney [name] is a 62 y.o. female who was admitted to [facility] on 08/26/12 with the above problem list.   She has a past medical history significant for PMHx s/f CAD (s/p BMS-RCA in 2009; see below), carotid artery disease (0-39% RICA, 40-59% LICA in 10/2011), AV sclerosis (per 2010 echo), h/o PVCs, h/o fluid overload on prior hospitalization, HLD and shellfish allergy.   PAST CARDIAC HISTORY  She  underwent cardiac catheterization 11/2007 in the setting of NSTEMI. She received BMS-RCA; EF 45% and 2+ MR noted at that time. She underwent stress echo 02/2009 in the setting of recurrent chest pain. This revealed lateral ST depressions on stress and inferior HK. Follow-up cardiac catheterization later that month indicated patent mid RCA stent, stenosed PLB supplying a small territory. She was medically managed. EF 55-60%, no MR. She has had previous Holter monitoring for palpitations attributed to PVCs. Overall stable from a cardiac perspective on last follow-up with Lovena Neighbours, MD 11/2011.   She was in her USOH until approximately 5 days leading up to admission when she began experiencing a higher frequency of palpitations with associated shortness of breath. She then developed marked fatigue described as feeling "drained." She reported this constellation of symptoms was very similar to her prior MI. She did not experience chest pain then or prior to this admission. She denied PND, orthopnea, LE edema, lightheadedness or syncope. She denied thyroid abnormalities, anemia, active bleeding, sleep apnea, or unintentional weight loss. Denies diabetes or tobacco abuse. Denies tobacco, EtOH, illicit drugs, supplements or caffeine use. No increased stress. She exercises twice a week without incident.   In the ED, EKG did reveal occasional PVCs, sinus bradycardia (rate mid 40s) with subtle TW inversions/flattening in the anterior leads. Overall unchanged from prior tracings. Initial trop-I WNL. CXR as above was w/o acute cardiopulmonary disease. Given her cardiac history and endorsement of her anginal equivalent, the decision was made to observe the patient overnight for formal rule out and to  proceed with ETT Myoview the following morning.   Hospital Course   The patient's beta blocker was held. Home medications were otherwise continued. There were no overnight events. Two subsequent troponins returned WNL. She  remained NPO past midnight. She did undergo ETT Myoview. She had excellent exercise capacity easily achieving her target HR of 133 bpm, ex time 8:00, stage 3. There no specific ST/T changes. Myoview images as above were interpreted as negative. She was deemed stable for discharge.  She will be discharged on the medication regimen outlined below. The patient's fatigue may be attributed to an increased PVC burden. Will plan 48 hour Holter monitor to assess for this and consider EP follow-up to discuss ablation options. She will be contacted by our office to arrange for this and follow-up in 2-4 weeks after. This information, including supplemental PVC educational material, has been clearly outlined in the discharge AVS.   Discharge Vitals:  Blood pressure 158/39, pulse 53, temperature 97.9 F (36.6 C), temperature source Oral, resp. rate 20, SpO2 96.00%.   Labs: Recent Labs     08/26/12  1010  08/26/12  1722  WBC  6.1  7.0  HGB  13.0  13.0  HCT  36.8  37.0  MCV  89.3  89.6  PLT  205  207    Recent Labs Lab 08/26/12 1010 08/26/12 1722 08/27/12 0526  NA 138  --  140  K 4.3  --  4.6  CL 104  --  104  CO2 26  --  25  BUN 22  --  24*  CREATININE 0.87 0.86 0.85  CALCIUM 9.4  --  9.8  GLUCOSE 91  --  104*   Recent Labs     08/26/12  1722  08/26/12  2230  TROPONINI  <0.30  <0.30   Recent Labs     08/27/12  0526  CHOL  192  HDL  72  LDLCALC  96  TRIG  119  CHOLHDL  2.7    Recent Labs  08/26/12 1722  TSH 0.876    Disposition:   Future Appointments Provider Department Dept Phone   08/27/2012 1:30 PM Dyann Kief, PA-C [facility] Main Office [place]) [phone]   09/15/2012 9:30 AM Rosalio Macadamia, NP [facility] Main Office [place]) [phone]         Follow-up Information   Follow up with Norma Fredrickson, NP On 09/15/2012. (At 9:30 AM for cardiology follow-up. )    Contact information:   [address] [place]  [address] [phone]       Follow up with Astrid Divine, MD In 1 week. (For general post-hospital follow-up. )    Contact information:   [address], [address] [place] [address] [phone]       Discharge Medications:    Medication List    ASK your doctor about these medications       acetaminophen 500 MG tablet  Commonly known as:  TYLENOL  Take 1,000 mg by mouth every 6 (six) hours as needed.     aspirin 325 MG EC tablet  Take 325 mg by mouth daily.     meloxicam 7.5 MG tablet  Commonly known as:  MOBIC  Take 7.5 mg by mouth daily as needed for pain.     metoprolol tartrate 25 MG tablet  Commonly known as:  LOPRESSOR  Take 12.5 mg by mouth 2 (two) times daily.     nitroGLYCERIN 0.4 MG SL  tablet  Commonly known as:  NITROSTAT  Place 0.4 mg under the tongue every 5 (five) minutes as needed for chest pain.     PROBIOTIC DAILY Caps  Take 1 capsule by mouth daily. Suprema Dophilus     ramipril 5 MG capsule  Commonly known as:  ALTACE  Take 1 capsule (5 mg total) by mouth daily.     rosuvastatin 20 MG tablet  Commonly known as:  CRESTOR  Take 1 tablet (20 mg total) by mouth daily.     sertraline 50 MG tablet  Commonly known as:  ZOLOFT  Take 50 mg by mouth daily.       Outstanding Labs/Studies: 48 hour Holter monitor  Duration of Discharge Encounter: Greater than 30 minutes including physician time.  Signed, R. Hurman Horn, PA-C 08/27/2012, 12:09 PM    Attending Note:   The patient was seen and examined.  Agree with assessment and plan as noted above.  Changes made to the above note as needed.  See my note from 08/27/12.  Pt is OK for DC  Alvia Grove., MD, [facility] 08/28/2012, 9:01 AM

## 2012-08-27 NOTE — ED Provider Notes (Signed)
Medical screening examination/treatment/procedure(s) were performed by non-physician practitioner and as supervising physician I was immediately available for consultation/collaboration.   Carleene Cooper III, MD 08/27/12 [phone]

## 2012-08-27 NOTE — Progress Notes (Signed)
Patient Name: Courtney [name] Date of Encounter: 08/27/2012     Principal Problem:   Fatigue Active Problems:   Carotid artery disease   Aortic valve sclerosis   Dyslipidemia   Coronary artery disease   Palpitations   Sinus bradycardia    SUBJECTIVE: No events overnight. Occasional palpitations. No chest pain or shortness of breath.   OBJECTIVE  Filed Vitals:   08/27/12 0914 08/27/12 0917 08/27/12 0919 08/27/12 0921  BP: 163/53 164/60 177/61 158/39  Pulse:      Temp:      TempSrc:      Resp:      SpO2:       No intake or output data in the 24 hours ending 08/27/12 1111 Weight change:   PHYSICAL EXAM  General: Well developed, well nourished, in no acute distress. Head: Normocephalic, atraumatic, sclera non-icteric, no xanthomas, nares are without discharge. Neck: Supple without bruits or JVD. Lungs:  Resp regular and unlabored, CTA. Heart: Bradycardia, II/VI crescendo-decrescendo murmur at RUSB, no s3, s4 Abdomen: Soft, non-tender, non-distended, BS + x 4.  Msk:  Strength and tone appears normal for age. Extremities: No clubbing, cyanosis or edema. DP/PT/Radials 2+ and equal bilaterally. Neuro: Alert and oriented X 3. Moves all extremities spontaneously. Psych: Normal affect.  LABS:  Recent Labs     08/26/12  1010  08/26/12  1722  WBC  6.1  7.0  HGB  13.0  13.0  HCT  36.8  37.0  MCV  89.3  89.6  PLT  205  207    Recent Labs Lab 08/26/12 1010 08/26/12 1722 08/27/12 0526  NA 138  --  140  K 4.3  --  4.6  CL 104  --  104  CO2 26  --  25  BUN 22  --  24*  CREATININE 0.87 0.86 0.85  CALCIUM 9.4  --  9.8  GLUCOSE 91  --  104*   Recent Labs     08/26/12  1722  08/26/12  2230  TROPONINI  <0.30  <0.30   Recent Labs     08/27/12  0526  CHOL  192  HDL  72  LDLCALC  96  TRIG  119  CHOLHDL  2.7    Recent Labs  08/26/12 1722  TSH 0.876    TELE: sinus bradycardia/NSR, HR 50-60s, nadir 49 while asleep  Radiology/Studies:  Dg Chest 2  View  08/26/2012   *RADIOLOGY REPORT*  Clinical Data: Chest pain.  Remote myocardial infarction.  CHEST - 2 VIEW  Comparison: 12/01/2007.  Findings: Lungs clear well-aerated.  No effusion pneumothorax. Normal heart size and mediastinal contours.  Abdominal aortic atherosclerosis.  Lower cervical degenerative facet arthropathy.  IMPRESSION: No evidence of acute cardiopulmonary disease.   Original Report Authenticated By: Tiburcio Pea    Current Medications:  . aspirin EC  81 mg Oral Daily  . atorvastatin  40 mg Oral q1800  . enoxaparin (LOVENOX) injection  40 mg Subcutaneous Q24H  . ramipril  5 mg Oral Daily  . sertraline  50 mg Oral Daily  . sodium chloride  3 mL Intravenous Q12H    ASSESSMENT AND PLAN:  62 y.o. female with PMHx s/f CAD (s/p BMS-RCA in 2009; see below), carotid artery disease (0-39% RICA, 40-59% LICA in 10/2011), AV sclerosis (per 2010 echo), h/o fluid overload on prior hospitalization, HLD and shellfish allergy who presents to [facility] ED today c/o chest pain.   1. Fatigue- anginal equivalent. No further episodes overnight.  NPO this AM. Plan ETT Myoview to further assess underlying flow-limiting CAD as a source. She did rule out overnight. TSH, T4 WNL. O2 sat's high 90s overnight/early this AM.    2. Palpitations- secondary to PVCs. TSH, T4 WNL. Mg and electrolytes WNL. No evidence of nocturnal hypoxia overnight. If stress test low risk/normal, consider Holter monitor to evaluate PVC burden +/- EP follow-up for potential ablation. Favor restarting low-dose BB- had tolerated Lopressor 12.5mg  BID for several months and was taking prior to admission yesterday. Benefit with underlying CAD. She was however still having PVCs on this and would be hesitant to increase with sinus bradycardia ( HR in 40s at times).   3. Coronary artery disease- ruled out overnight. Will review results of Myoview this AM.    4. Sinus bradycardia- HR 50s off low-dose metoprolol tartrate. Generally  asymptomatic with this. Symptomatic with PVCs however. Can restart low-dose BB, but patient still had PVCs with this and would hold off on increasing.    5. Carotid artery disease- nonobstructive on 10/2011 carotid dopplers. Recheck in September.   6. AV sclerosis- stable. Review gated Myoview images to ensure no evidence of EF reduction. This will continue to be monitoring.   7. Hyperlipidemia- LDL 96, HDL 72, TG 119, TC 192. Continue statin.   8. Shellfish allergy   Dispo: if ETT Myoview low risk/normal. Discharge today with follow-up in 2-4 weeks. Arrange 48 hour Holter monitor. Continue home meds including ASA, ACEi, low-dose BB, statin, NTG SL PRN.   Signed, R. Hurman Horn, PA-C 08/27/2012, 11:11 AM   Attending Note:   The patient was seen and examined.  Agree with assessment and plan as noted above.  Changes made to the above note as needed.  Patient was seen on 08/27/12.  She is pain free.  myoview is negative.  OK to go home   Vesta Mixer, Montez Hageman., MD, [facility] 08/28/2012, 9:00 AM

## 2012-08-27 NOTE — Progress Notes (Signed)
Spoke with patient at bedside to discuss Link to [employer] program for [employer] employees with [employer]. She declines having any needs at this time. Reports she has follow up appointments planned and does not feel like she needs a follow up phone call. Left brochure and contact information. Appreciative of visit. Raiford Noble, MSN-Ed, RN,BSN- [facility] Liaison--[phone]

## 2012-08-28 ENCOUNTER — Ambulatory Visit (INDEPENDENT_AMBULATORY_CARE_PROVIDER_SITE_OTHER): Payer: 59

## 2012-08-28 ENCOUNTER — Other Ambulatory Visit: Payer: Self-pay

## 2012-08-28 DIAGNOSIS — I4949 Other premature depolarization: Secondary | ICD-10-CM

## 2012-08-28 DIAGNOSIS — R079 Chest pain, unspecified: Secondary | ICD-10-CM

## 2012-08-28 NOTE — Progress Notes (Signed)
Placed a 48 hr holter monitor

## 2012-08-31 ENCOUNTER — Other Ambulatory Visit: Payer: Self-pay | Admitting: Cardiology

## 2012-09-15 ENCOUNTER — Encounter: Payer: 59 | Admitting: Nurse Practitioner

## 2012-09-22 ENCOUNTER — Encounter: Payer: Self-pay | Admitting: Nurse Practitioner

## 2012-09-22 ENCOUNTER — Ambulatory Visit (INDEPENDENT_AMBULATORY_CARE_PROVIDER_SITE_OTHER): Payer: 59 | Admitting: Nurse Practitioner

## 2012-09-22 VITALS — BP 120/70 | HR 64 | Ht 60.0 in | Wt 168.0 lb

## 2012-09-22 DIAGNOSIS — I2510 Atherosclerotic heart disease of native coronary artery without angina pectoris: Secondary | ICD-10-CM

## 2012-09-22 DIAGNOSIS — I4949 Other premature depolarization: Secondary | ICD-10-CM

## 2012-09-22 NOTE — Patient Instructions (Signed)
I think you are doing ok  Stay on your current medicines  See Dr. Myrtis Ser in October  Let us know if you start having more problems with your PVCs  Really limit caffeine use  Call the [facility] office at [phone] if you have any questions, problems or concerns.

## 2012-09-22 NOTE — Progress Notes (Signed)
Courtney [name] Date of Birth: [birth year] Medical Record #[id number]  History of Present Illness: Courtney [name] is seen back today for a post hospital visit. Seen for Dr. Myrtis Ser. Has known CAD with past BMS to the RCA in 2009. She underwent cardiac catheterization 11/2007 in the setting of NSTEMI. She received BMS-RCA; EF 45% and 2+ MR noted at that time. She underwent stress echo 02/2009 in the setting of recurrent chest pain. This revealed lateral ST depressions on stress and inferior HK. Follow-up cardiac catheterization later that month in January of 2011 indicated patent mid RCA stent, stenosed PLB supplying a small territory. She has been managed medically. Her other issues include carotid disease, AV sclerosis, PVC's, HLD, shellfish allergy and past history of fluid overload on prior hospitalization.   Most recently admitted with increasing palpitations associated with SOB and fatigue - her angina equivalent - was admitted. Had negative enzymes. Underwent Myoview testing which was negative for ischemia and with an EF of 64%. Her symptoms were thought to be related to her PVC's. A 48 hour Holter was to be arranged and then be considered for EP referral. She was bradycardic with rates in the mid 40s. Beta blocker was resumed at discharge.   Comes back today. Here alone. Doing better now. She is not sure why. Has cut her caffeine back at work. No more of the "drained feeling". Her PVC's are really not causing an issue for her at this time. Feels ok overall. Not really wanting to proceed with an EP evaluation.    Current Outpatient Prescriptions  Medication Sig Dispense Refill  . acetaminophen (TYLENOL) 500 MG tablet Take 1,000 mg by mouth every 6 (six) hours as needed.        [name] aspirin 325 MG EC tablet Take 325 mg by mouth daily.        . CRESTOR 20 MG tablet TAKE 1 TABLET BY MOUTH ONCE DAILY  90 tablet  1  . metoprolol tartrate (LOPRESSOR) 25 MG tablet Take 12.5 mg by mouth 2 (two) times daily.      .  Probiotic Product (PROBIOTIC DAILY) CAPS Take 1 capsule by mouth daily. Suprema Dophilus      . ramipril (ALTACE) 5 MG capsule Take 1 capsule (5 mg total) by mouth daily.  90 capsule  1  . sertraline (ZOLOFT) 50 MG tablet Take 50 mg by mouth daily.        . nitroGLYCERIN (NITROSTAT) 0.4 MG SL tablet Place 0.4 mg under the tongue every 5 (five) minutes as needed for chest pain.       No current facility-administered medications for this visit.    Allergies  Allergen Reactions  . Erythromycin   . Penicillins     Past Medical History  Diagnosis Date  . Carotid artery disease     40-59% bilateral, doppler December 2010  . Shellfish allergy   . PVC (premature ventricular contraction)     per prior Holter monitor  . Aortic valve sclerosis     echo 12/2008, soft systolic murmur  . Dyslipidemia   . Coronary artery disease     a. BMS-mid RCA 11/2007 b. stress echo 02/2009 subtle inferior HK, lateral ST depressions with stress c. follow-up cath 02/2009: RCA stent patent, severe but stable stenosis of distal posterior lateral branch RCA. LV normal, med tx unless more sx c. ETT Myoview 08/27/12: negative for ischemia; EF 64%  . Right hip pain 12/2009  . Ejection fraction     EF  normal, catheterization, 2011 /  EF 55%, echo, 2010  . Fluid overload     Mild, stable since hospitalization in the past  . Depression     Mild, August, 2012    Past Surgical History  Procedure Laterality Date  . Coronary angioplasty with stent placement  11/2007    BMS-mid RCA  . Cardiac catheterization  1/211    Patent RCA stent, stenotic PLB supplying small distribution; medically managed    History  Smoking status  . Never Smoker   Smokeless tobacco  . Never Used    History  Alcohol Use  . Yes    Comment: 1 glass of wine/night    Family History  Problem Relation Age of Onset  . Heart attack Father 19    Review of Systems: The review of systems is per the HPI.  All other systems were reviewed  and are negative.  Physical Exam: BP 120/70  Pulse 64  Ht 5' (1.524 m)  Wt 168 lb (76.204 kg)  BMI 32.81 kg/m2 Patient is very pleasant and in no acute distress. Skin is warm and dry. Color is normal.  HEENT is unremarkable. Normocephalic/atraumatic. PERRL. Sclera are nonicteric. Neck is supple. No masses. No JVD. Lungs are clear. Cardiac exam shows a regular rate and rhythm. Outflow murmur noted.  Abdomen is soft. Extremities are without edema. Gait and ROM are intact. No gross neurologic deficits noted.  LABORATORY DATA:  48 hour Holter (copy) is reviewed - Minimum HR was 49, Maximum HR was 101 and average HR was 65. Had 22514 PVCs - 12.2% burden for this 48 hour period. No pauses. 6 couplets and 2 runs of SVT - only 6 beats.  Lab Results  Component Value Date   WBC 7.0 08/26/2012   HGB 13.0 08/26/2012   HCT 37.0 08/26/2012   PLT 207 08/26/2012   GLUCOSE 104* 08/27/2012   CHOL 192 08/27/2012   TRIG 119 08/27/2012   HDL 72 08/27/2012   LDLCALC 96 08/27/2012   ALT 24 12/05/2009   AST 22 12/05/2009   NA 140 08/27/2012   K 4.6 08/27/2012   CL 104 08/27/2012   CREATININE 0.85 08/27/2012   BUN 24* 08/27/2012   CO2 25 08/27/2012   TSH 0.876 08/26/2012   INR 1.0 ratio 03/10/2009   Lab Results  Component Value Date   CKTOTAL 909* 11/29/2007   CKMB 100.8* 11/29/2007   TROPONINI <0.30 08/26/2012   Dg Chest 2 View  08/26/2012   IMPRESSION: No evidence of acute cardiopulmonary disease.   Original Report Authenticated By: Tiburcio Pea   Nm Myocar Multi W/spect W/wall Motion / Ef  08/27/2012    She then walked on a standard Bruce protocol treadmill test for total of 8 minutes.  She achieved a peak heart rate of 137 which is 87% of the predicted maximum heart rate.  There are no ST-T wave changes to suggest ischemia.  The raw data images reveal minimal motion artifact.  There is some increased uptake in structure below the diaphragm.  The stress Myoview images reveal fairly smooth and homogeneous uptake of  all areas of the myocardium. There is very minimal apical thinning.  The resting Myoview images reveal a similar pattern of smooth and homogeneous uptake of all areas of the myocardium with a small amount of apical thinning.  There is no evidence of reversible ischemia. There is no evidence of infarction.  The quantitated gated SPECT images reveal an end diastolic volume of 70 ml.  The end-systolic volume is 25 ml.  The ejection fraction is 64%.  The cine loop images reveal no segmental wall motion abnormalities.  This is interpreted as a negative stress Myoview study.  There is no evidence of ischemia.  She has normal left ventricular systolic function  with an ejection fraction of 64%.    Kristeen Miss, Montez Hageman MD, [facility]  August 27, 2012,    11:10 AM   Original Report Authenticated By: Kristeen Miss, [name]    Assessment / Plan: 1. Fatigue/increased PVC's - she is better clinically. I think her Holter looks fine. Dr. Myrtis Ser has her original copy for his review/signature. She is not interested in EP evaluation at this time. Will see her back in October as planned.   2. CAD - with past PCI - recent negative Myoview   Patient is agreeable to this plan and will call if any problems develop in the interim.   Rosalio Macadamia, RN, ANP-C [facility] [address] [place], [place]  [address]

## 2012-10-26 ENCOUNTER — Encounter: Payer: Self-pay | Admitting: Cardiology

## 2012-10-26 NOTE — Progress Notes (Signed)
   Recent evaluation has included a 48 hour Holter monitor. There were no significant tachycardia or bradycardia arrhythmias. However the PVC count for 48 hours was 22,514. This correlates to more than 10,000 PVCs per day. In this setting we have to decide if treatment for the PVCs alone he is warranted. The patient has normal LV function by nuclear study. We will consider a followup echo to reassess further. With normal LV function and no significant symptoms, the patient can be followed. Sometimes the PVCs decrease significantly. If she has symptoms, we would consider treatment with a beta blocker, calcium blocker or 1C agent. However she has coronary disease. Therefore I doubt that a 1C agent could be used. I will see her for followup and then request EP advice if needed.

## 2012-11-18 ENCOUNTER — Ambulatory Visit (INDEPENDENT_AMBULATORY_CARE_PROVIDER_SITE_OTHER): Payer: 59 | Admitting: Cardiology

## 2012-11-18 ENCOUNTER — Encounter: Payer: Self-pay | Admitting: Cardiology

## 2012-11-18 VITALS — BP 123/60 | HR 53 | Wt 168.0 lb

## 2012-11-18 DIAGNOSIS — R0989 Other specified symptoms and signs involving the circulatory and respiratory systems: Secondary | ICD-10-CM

## 2012-11-18 DIAGNOSIS — I498 Other specified cardiac arrhythmias: Secondary | ICD-10-CM

## 2012-11-18 DIAGNOSIS — R9430 Abnormal result of cardiovascular function study, unspecified: Secondary | ICD-10-CM

## 2012-11-18 DIAGNOSIS — I779 Disorder of arteries and arterioles, unspecified: Secondary | ICD-10-CM

## 2012-11-18 DIAGNOSIS — I2510 Atherosclerotic heart disease of native coronary artery without angina pectoris: Secondary | ICD-10-CM

## 2012-11-18 DIAGNOSIS — R001 Bradycardia, unspecified: Secondary | ICD-10-CM

## 2012-11-18 DIAGNOSIS — I493 Ventricular premature depolarization: Secondary | ICD-10-CM

## 2012-11-18 DIAGNOSIS — E8779 Other fluid overload: Secondary | ICD-10-CM

## 2012-11-18 DIAGNOSIS — I4949 Other premature depolarization: Secondary | ICD-10-CM

## 2012-11-18 DIAGNOSIS — E8770 Fluid overload, unspecified: Secondary | ICD-10-CM

## 2012-11-18 NOTE — Assessment & Plan Note (Signed)
She is not having any significant bradycardia on her current medicines. However we may be limited in terms of being able to increase her beta blocker in the future if needed.

## 2012-11-18 NOTE — Assessment & Plan Note (Signed)
I had a long discussion with Dr. Graciela Husbands about the approach to greater than 10,000 PVCs per day. We are concerned with this data about the possibility of developing a cardiomyopathy. We know that her left ventricular function is normal. If the patient has normal left ventricular function, the first approach is to watch the patient. Frequently the PVCs will improve. Clinically this is the case in her situation. If the patient has left ventricular dysfunction the PVCs need to be treated. At this point this is not her situation. If the patient has significant symptoms the PVCs need to be treated. She did have symptoms but they are gone. Therefore the approach will be to watch her clinically. Also I will look again at her stress test to see what happens to her PVCs when walking. I will also review her Holter monitor to see if I am convinced that these are PVCs as opposed to PACs.

## 2012-11-18 NOTE — Assessment & Plan Note (Signed)
Her last cath was done in January, 2011. The RCA stent was patent. She has a severe stenosis of the distal posterior lateral branch of the RCA. We follow this clinically.

## 2012-11-18 NOTE — Assessment & Plan Note (Signed)
It is time for her carotid Doppler followup and this is being scheduled.  As part of today's evaluation I spent greater than 25 minutes with her total care. More than half of 25 minutes was spent discussing all these issues with her. In addition I spent substantial other time reviewing her case with others.

## 2012-11-18 NOTE — Patient Instructions (Addendum)
**Note De-identified Eliodoro Gullett Obfuscation** Your physician has requested that you have a carotid duplex. This test is an ultrasound of the carotid arteries in your neck. It looks at blood flow through these arteries that supply the brain with blood. Allow one hour for this exam. There are no restrictions or special instructions.  Your physician recommends that you schedule a follow-up appointment in: 3 months  

## 2012-11-18 NOTE — Assessment & Plan Note (Signed)
Her volume status is stable. No change in therapy. 

## 2012-11-18 NOTE — Progress Notes (Signed)
HPI  Patient is seen to followup coronary disease and PVCs and carotid artery disease. The patient had been admitted in July, 2014 with palpitations and some shortness of breath and fatigue. Enzymes were negative. Nuclear stress study revealed no ischemia. Ejection fraction was 64%. She walked on the Bruce protocol. There is no mention of increased ectopy. I will review the strips again. Overall she did well. There was no ischemia. She was seen back in the office by Norma Fredrickson who felt that she was stable. She did wear a Holter monitor. I have reviewed this carefully. The PVC count was greater then 10,000 per day. However I am not convinced that these are all PVCs. This will be rereviewed.  It is of note that she had significant palpitations when she was admitted to the hospital. She thought that she had fewer when she wore the monitor. Since then she has had even less. She has not been bothered by palpitations at this time.  Allergies  Allergen Reactions  . Erythromycin   . Penicillins     Current Outpatient Prescriptions  Medication Sig Dispense Refill  . acetaminophen (TYLENOL) 500 MG tablet Take 1,000 mg by mouth every 6 (six) hours as needed.        [name] aspirin 325 MG EC tablet Take 325 mg by mouth daily.        . CRESTOR 20 MG tablet TAKE 1 TABLET BY MOUTH ONCE DAILY  90 tablet  1  . metoprolol tartrate (LOPRESSOR) 25 MG tablet Take 12.5 mg by mouth 2 (two) times daily.      . nitroGLYCERIN (NITROSTAT) 0.4 MG SL tablet Place 0.4 mg under the tongue every 5 (five) minutes as needed for chest pain.      . Probiotic Product (PROBIOTIC DAILY) CAPS Take 1 capsule by mouth daily. Suprema Dophilus      . ramipril (ALTACE) 5 MG capsule Take 1 capsule (5 mg total) by mouth daily.  90 capsule  1  . sertraline (ZOLOFT) 50 MG tablet Take 50 mg by mouth daily.         No current facility-administered medications for this visit.    History   Social History  . Marital Status: Divorced   Spouse Name: N/A    Number of Children: N/A  . Years of Education: N/A   Occupational History  . Not on file.   Social History Main Topics  . Smoking status: Never Smoker   . Smokeless tobacco: Never Used  . Alcohol Use: Yes     Comment: 1 glass of wine/night  . Drug Use: No  . Sexual Activity: Yes   Other Topics Concern  . Not on file   Social History Narrative  . No narrative on file    Family History  Problem Relation Age of Onset  . Heart attack Father 2    Past Medical History  Diagnosis Date  . Carotid artery disease     40-59% bilateral, doppler December 2010  . Shellfish allergy   . PVC (premature ventricular contraction)     per prior Holter monitor  . Aortic valve sclerosis     echo 12/2008, soft systolic murmur  . Dyslipidemia   . Coronary artery disease     a. BMS-mid RCA 11/2007 b. stress echo 02/2009 subtle inferior HK, lateral ST depressions with stress c. follow-up cath 02/2009: RCA stent patent, severe but stable stenosis of distal posterior lateral branch RCA. LV normal, med tx unless more sx c.  ETT Myoview 08/27/12: negative for ischemia; EF 64%  . Right hip pain 12/2009  . Ejection fraction     EF normal, catheterization, 2011 /  EF 55%, echo, 2010  . Fluid overload     Mild, stable since hospitalization in the past  . Depression     Mild, August, 2012    Past Surgical History  Procedure Laterality Date  . Coronary angioplasty with stent placement  11/2007    BMS-mid RCA  . Cardiac catheterization  1/211    Patent RCA stent, stenotic PLB supplying small distribution; medically managed    Patient Active Problem List   Diagnosis Date Noted  . Palpitations 08/27/2012  . Fatigue 08/27/2012  . Sinus bradycardia 08/27/2012  . Depression   . Carotid artery disease   . PVC (premature ventricular contraction)   . Aortic valve sclerosis   . Dyslipidemia   . Coronary artery disease   . CAD (coronary artery disease)   . Ejection fraction   .  Fluid overload     ROS   Patient denies fever, chills, headache, sweats, rash, change in vision, change in hearing, chest pain, cough, nausea or vomiting, urinary symptoms. All other systems are reviewed and are negative.  PHYSICAL EXAM  Patient is overweight. She is oriented to person time and place. Affect is normal. There is no jugulovenous distention. There are no carotid bruits. Lungs are clear. Respiratory effort is not labored. Cardiac exam reveals S1 and S2. The rhythm is perfectly regular. There is no peripheral edema. The abdomen is soft. There no musculoskeletal deformities. There are no skin rashes.  Filed Vitals:   11/18/12 1619  BP: 123/60  Pulse: 53  Weight: 168 lb (76.204 kg)     ASSESSMENT & PLAN

## 2012-12-02 ENCOUNTER — Other Ambulatory Visit: Payer: Self-pay | Admitting: Cardiology

## 2012-12-09 ENCOUNTER — Ambulatory Visit (HOSPITAL_COMMUNITY): Payer: 59 | Attending: Cardiology

## 2012-12-09 DIAGNOSIS — E785 Hyperlipidemia, unspecified: Secondary | ICD-10-CM | POA: Insufficient documentation

## 2012-12-09 DIAGNOSIS — I10 Essential (primary) hypertension: Secondary | ICD-10-CM | POA: Insufficient documentation

## 2012-12-09 DIAGNOSIS — Z951 Presence of aortocoronary bypass graft: Secondary | ICD-10-CM | POA: Insufficient documentation

## 2012-12-09 DIAGNOSIS — R0989 Other specified symptoms and signs involving the circulatory and respiratory systems: Secondary | ICD-10-CM | POA: Insufficient documentation

## 2012-12-09 DIAGNOSIS — I658 Occlusion and stenosis of other precerebral arteries: Secondary | ICD-10-CM | POA: Insufficient documentation

## 2012-12-09 DIAGNOSIS — I6529 Occlusion and stenosis of unspecified carotid artery: Secondary | ICD-10-CM | POA: Insufficient documentation

## 2012-12-09 DIAGNOSIS — I779 Disorder of arteries and arterioles, unspecified: Secondary | ICD-10-CM

## 2012-12-18 ENCOUNTER — Encounter: Payer: Self-pay | Admitting: Cardiology

## 2012-12-24 ENCOUNTER — Other Ambulatory Visit: Payer: Self-pay

## 2013-01-19 ENCOUNTER — Other Ambulatory Visit: Payer: Self-pay | Admitting: Cardiology

## 2013-02-22 ENCOUNTER — Other Ambulatory Visit: Payer: Self-pay | Admitting: Gastroenterology

## 2013-03-15 ENCOUNTER — Other Ambulatory Visit: Payer: Self-pay | Admitting: Cardiology

## 2013-04-20 ENCOUNTER — Encounter: Payer: Self-pay | Admitting: Cardiology

## 2013-04-23 ENCOUNTER — Ambulatory Visit: Payer: 59 | Admitting: Cardiology

## 2013-04-27 ENCOUNTER — Other Ambulatory Visit: Payer: Self-pay | Admitting: Cardiology

## 2013-05-28 ENCOUNTER — Ambulatory Visit (INDEPENDENT_AMBULATORY_CARE_PROVIDER_SITE_OTHER): Payer: 59 | Admitting: Cardiology

## 2013-05-28 ENCOUNTER — Encounter: Payer: Self-pay | Admitting: Cardiology

## 2013-05-28 VITALS — BP 112/66 | HR 76 | Ht 60.0 in | Wt 171.8 lb

## 2013-05-28 DIAGNOSIS — I739 Peripheral vascular disease, unspecified: Secondary | ICD-10-CM

## 2013-05-28 DIAGNOSIS — I4949 Other premature depolarization: Secondary | ICD-10-CM

## 2013-05-28 DIAGNOSIS — I779 Disorder of arteries and arterioles, unspecified: Secondary | ICD-10-CM

## 2013-05-28 DIAGNOSIS — I2510 Atherosclerotic heart disease of native coronary artery without angina pectoris: Secondary | ICD-10-CM

## 2013-05-28 DIAGNOSIS — I493 Ventricular premature depolarization: Secondary | ICD-10-CM

## 2013-05-28 MED ORDER — RAMIPRIL 5 MG PO CAPS: 5.0000 mg | ORAL_CAPSULE | Freq: Every day | ORAL | Status: DC

## 2013-05-28 MED ORDER — METOPROLOL TARTRATE 25 MG PO TABS: 12.5000 mg | ORAL_TABLET | Freq: Two times a day (BID) | ORAL | Status: DC

## 2013-05-28 MED ORDER — ASPIRIN EC 81 MG PO TBEC: 325.0000 mg | DELAYED_RELEASE_TABLET | Freq: Every day | ORAL | Status: DC

## 2013-05-28 NOTE — Assessment & Plan Note (Signed)
Her carotids are being followed carefully. Her Doppler in October, 2014 was stable. She will have a followup Doppler in October, 2015.

## 2013-05-28 NOTE — Assessment & Plan Note (Signed)
Her coronary disease is stable. No change in therapy. We will reduce her aspirin dose.

## 2013-05-28 NOTE — Progress Notes (Signed)
Patient ID: Courtney [name], female   DOB: 03/12/1950, 63 y.o.   MRN: [id number]    HPI  Patient is seen today to followup coronary disease and PVCs. She is feeling very well. In July, 2014 had palpitations and many PVCs. At that time her PVC count was actually significantly elevated. I reviewed all this with Dr. Caryl Comes. We decided to follow her and consider a followup echo if the PVCs continued. She has good underlying LV function. She is not having any further problems with PVCs. She is active. She dances on a regular basis.  Allergies  Allergen Reactions  . Erythromycin   . Penicillins     Current Outpatient Prescriptions  Medication Sig Dispense Refill  . acetaminophen (TYLENOL) 500 MG tablet Take 1,000 mg by mouth every 6 (six) hours as needed.        [name] aspirin 325 MG EC tablet Take 325 mg by mouth daily.        . CRESTOR 20 MG tablet TAKE 1 TABLET BY MOUTH ONCE DAILY  90 tablet  0  . metoprolol tartrate (LOPRESSOR) 25 MG tablet Take 12.5 mg by mouth 2 (two) times daily.      . nitroGLYCERIN (NITROSTAT) 0.4 MG SL tablet Place 0.4 mg under the tongue every 5 (five) minutes as needed for chest pain.      . Probiotic Product (PROBIOTIC DAILY) CAPS Take 1 capsule by mouth daily. Suprema Dophilus      . ramipril (ALTACE) 5 MG capsule TAKE 1 CAPSULE BY MOUTH DAILY.  90 capsule  0  . sertraline (ZOLOFT) 50 MG tablet Take 50 mg by mouth daily.         No current facility-administered medications for this visit.    History   Social History  . Marital Status: Divorced    Spouse Name: N/A    Number of Children: N/A  . Years of Education: N/A   Occupational History  . Not on file.   Social History Main Topics  . Smoking status: Never Smoker   . Smokeless tobacco: Never Used  . Alcohol Use: Yes     Comment: 1 glass of wine/night  . Drug Use: No  . Sexual Activity: Yes   Other Topics Concern  . Not on file   Social History Narrative  . No narrative on file    Family History    Problem Relation Age of Onset  . Heart attack Father 66    Past Medical History  Diagnosis Date  . Carotid artery disease     40-59% bilateral, doppler December 2010  . Shellfish allergy   . PVC (premature ventricular contraction)     per prior Holter monitor  . Aortic valve sclerosis     echo 40/3474, soft systolic murmur  . Dyslipidemia   . Coronary artery disease     a. BMS-mid RCA 11/2007 b. stress echo 02/2009 subtle inferior HK, lateral ST depressions with stress c. follow-up cath 02/2009: RCA stent patent, severe but stable stenosis of distal posterior lateral branch RCA. LV normal, med tx unless more sx c. ETT Myoview 08/27/12: negative for ischemia; EF 64%  . Right hip pain 12/2009  . Ejection fraction     EF normal, catheterization, 2011 /  EF 55%, echo, 2010  . Fluid overload     Mild, stable since hospitalization in the past  . Depression     Mild, August, 2012    Past Surgical History  Procedure Laterality Date  .  Coronary angioplasty with stent placement  11/2007    BMS-mid RCA  . Cardiac catheterization  1/211    Patent RCA stent, stenotic PLB supplying small distribution; medically managed    Patient Active Problem List   Diagnosis Date Noted  . Palpitations 08/27/2012  . Fatigue 08/27/2012  . Sinus bradycardia 08/27/2012  . Depression   . Carotid artery disease   . PVC (premature ventricular contraction)   . Aortic valve sclerosis   . Dyslipidemia   . Coronary artery disease   . CAD (coronary artery disease)   . Ejection fraction   . Fluid overload     ROS   Patient denies fever, chills, headache, sweats, rash, change in vision, change in hearing, chest pain, cough, nausea vomiting, urinary symptoms. All other systems are reviewed and are negative.  PHYSICAL EXAM  She is overweight. She is stable. She is oriented to person time and place. Affect is normal. There is no jugulovenous distention. Lungs are clear. Respiratory effort is nonlabored.  Cardiac exam reveals an S1 and S2. There is a soft systolic murmur. The abdomen is soft. There is no peripheral edema.  Filed Vitals:   05/28/13 1114  BP: 112/66  Pulse: 76  Height: 5' (1.524 m)  Weight: 171 lb 12.8 oz (77.928 kg)     ASSESSMENT & PLAN

## 2013-05-28 NOTE — Assessment & Plan Note (Signed)
I had been very concerned about her high-volume PVCs. However her symptoms of stopped. Examining her for 30 seconds today revealed no premature beats. I feel this issue has resolved. We will not plan a followup 2-D echo.

## 2013-05-28 NOTE — Patient Instructions (Addendum)
**Note De-Identified Curly Mackowski Obfuscation** Your physician has recommended you make the following change in your medication: decrease Aspirin to 81 mg daily.   Your physician wants you to follow-up in: 6 months. You will receive a reminder letter in the mail two months in advance. If you don't receive a letter, please call our office to schedule the follow-up appointment.

## 2013-07-16 ENCOUNTER — Other Ambulatory Visit (HOSPITAL_COMMUNITY): Payer: Self-pay | Admitting: Family Medicine

## 2013-07-16 ENCOUNTER — Ambulatory Visit
Admission: RE | Admit: 2013-07-16 | Discharge: 2013-07-16 | Disposition: A | Discharge status: Home / Self Care | Payer: 59 | Source: Ambulatory Visit | Attending: Family Medicine | Admitting: Family Medicine

## 2013-07-16 ENCOUNTER — Other Ambulatory Visit: Payer: Self-pay | Admitting: Family Medicine

## 2013-07-16 DIAGNOSIS — N951 Menopausal and female climacteric states: Secondary | ICD-10-CM

## 2013-07-16 DIAGNOSIS — M79671 Pain in right foot: Secondary | ICD-10-CM

## 2013-07-22 ENCOUNTER — Ambulatory Visit (HOSPITAL_COMMUNITY)
Admission: RE | Admit: 2013-07-22 | Discharge: 2013-07-22 | Disposition: A | Discharge status: Home / Self Care | Payer: 59 | Source: Ambulatory Visit | Attending: Family Medicine | Admitting: Family Medicine

## 2013-07-22 DIAGNOSIS — Z780 Asymptomatic menopausal state: Secondary | ICD-10-CM | POA: Insufficient documentation

## 2013-07-22 DIAGNOSIS — N951 Menopausal and female climacteric states: Secondary | ICD-10-CM

## 2013-07-22 DIAGNOSIS — Z13820 Encounter for screening for osteoporosis: Secondary | ICD-10-CM | POA: Insufficient documentation

## 2013-07-27 ENCOUNTER — Other Ambulatory Visit (HOSPITAL_COMMUNITY): Payer: Self-pay | Admitting: Family Medicine

## 2013-07-27 DIAGNOSIS — M79671 Pain in right foot: Secondary | ICD-10-CM

## 2013-07-28 ENCOUNTER — Other Ambulatory Visit: Payer: Self-pay | Admitting: Cardiology

## 2013-08-04 ENCOUNTER — Encounter (HOSPITAL_COMMUNITY): Payer: 59

## 2013-08-04 ENCOUNTER — Ambulatory Visit (HOSPITAL_COMMUNITY): Payer: 59

## 2013-10-29 ENCOUNTER — Telehealth: Payer: Self-pay | Admitting: *Deleted

## 2013-10-29 NOTE — Telephone Encounter (Signed)
Patient requests mobic refill be sent to [facility]. Please advise. Thanks, MI

## 2013-10-29 NOTE — Telephone Encounter (Signed)
**Note De-Identified Avagail Whittlesey Obfuscation** Because this medication is an anti-inflammatory drug used to treat pain or inflammation caused by arthritis and because it is no longer on the pts med list we, as the pts cardiologist, cant refill. I have left a detailed message on the pts VM explaning this to her and asked that she call us back if there is a reason that she wants Dr Ron Parker to fill her Mobic RX.

## 2013-11-01 ENCOUNTER — Other Ambulatory Visit (HOSPITAL_COMMUNITY): Payer: Self-pay | Admitting: Sports Medicine

## 2013-11-01 DIAGNOSIS — M25551 Pain in right hip: Secondary | ICD-10-CM

## 2013-11-10 ENCOUNTER — Ambulatory Visit (HOSPITAL_COMMUNITY)
Admission: RE | Admit: 2013-11-10 | Discharge: 2013-11-10 | Disposition: A | Discharge status: Home / Self Care | Payer: 59 | Source: Ambulatory Visit | Attending: Sports Medicine | Admitting: Sports Medicine

## 2013-11-10 DIAGNOSIS — M25559 Pain in unspecified hip: Secondary | ICD-10-CM | POA: Diagnosis present

## 2013-11-10 DIAGNOSIS — M25551 Pain in right hip: Secondary | ICD-10-CM

## 2013-11-17 ENCOUNTER — Ambulatory Visit: Payer: 59 | Admitting: Cardiology

## 2013-11-25 ENCOUNTER — Encounter: Payer: Self-pay | Admitting: Cardiology

## 2013-12-02 ENCOUNTER — Other Ambulatory Visit (HOSPITAL_COMMUNITY): Payer: Self-pay | Admitting: *Deleted

## 2013-12-02 DIAGNOSIS — I6523 Occlusion and stenosis of bilateral carotid arteries: Secondary | ICD-10-CM

## 2013-12-13 ENCOUNTER — Ambulatory Visit (INDEPENDENT_AMBULATORY_CARE_PROVIDER_SITE_OTHER): Payer: 59 | Admitting: Cardiology

## 2013-12-13 ENCOUNTER — Encounter: Payer: Self-pay | Admitting: Cardiology

## 2013-12-13 VITALS — BP 116/70 | HR 55 | Ht 61.0 in | Wt 181.0 lb

## 2013-12-13 DIAGNOSIS — I2510 Atherosclerotic heart disease of native coronary artery without angina pectoris: Secondary | ICD-10-CM

## 2013-12-13 DIAGNOSIS — E785 Hyperlipidemia, unspecified: Secondary | ICD-10-CM

## 2013-12-13 DIAGNOSIS — I493 Ventricular premature depolarization: Secondary | ICD-10-CM

## 2013-12-13 DIAGNOSIS — R002 Palpitations: Secondary | ICD-10-CM

## 2013-12-13 DIAGNOSIS — M25559 Pain in unspecified hip: Secondary | ICD-10-CM

## 2013-12-13 DIAGNOSIS — I739 Peripheral vascular disease, unspecified: Secondary | ICD-10-CM

## 2013-12-13 DIAGNOSIS — I779 Disorder of arteries and arterioles, unspecified: Secondary | ICD-10-CM

## 2013-12-13 MED ORDER — RAMIPRIL 5 MG PO CAPS: 5.0000 mg | ORAL_CAPSULE | Freq: Every day | ORAL | Status: DC

## 2013-12-13 NOTE — Assessment & Plan Note (Signed)
She'll be having a follow-up carotid Doppler in the next few days.

## 2013-12-13 NOTE — Progress Notes (Signed)
Patient ID: Courtney [name], female   DOB: Nov 08, 1950, 63 y.o.   MRN: [id number]    HPI  Patient is seen today to follow up coronary disease. She also had many PVCs at one point. We were concerned about her PVC volume and I was considering a follow-up echo to see if she was having a change in LV function. Her PVC volume decreased substantially. She is not having any chest pain. Unfortunately she is having hip pain and she is very discouraged with this.  Allergies  Allergen Reactions  . Erythromycin   . Penicillins     Current Outpatient Prescriptions  Medication Sig Dispense Refill  . acetaminophen (TYLENOL) 500 MG tablet Take 1,000 mg by mouth every 6 (six) hours as needed.        [name] aspirin 81 MG tablet Take 4 tablets (325 mg total) by mouth daily.  30 tablet  0  . CRESTOR 20 MG tablet TAKE 1 TABLET BY MOUTH ONCE DAILY  90 tablet  0  . meloxicam (MOBIC) 7.5 MG tablet Take 7.5 mg by mouth daily.      . metoprolol tartrate (LOPRESSOR) 25 MG tablet Take 0.5 tablets (12.5 mg total) by mouth 2 (two) times daily.  90 tablet  3  . nitroGLYCERIN (NITROSTAT) 0.4 MG SL tablet Place 0.4 mg under the tongue every 5 (five) minutes as needed for chest pain.      . Probiotic Product (PROBIOTIC DAILY) CAPS Take 1 capsule by mouth daily. Suprema Dophilus      . ramipril (ALTACE) 5 MG capsule Take 1 capsule (5 mg total) by mouth daily.  90 capsule  3  . sertraline (ZOLOFT) 50 MG tablet Take 50 mg by mouth daily.         No current facility-administered medications for this visit.    History   Social History  . Marital Status: Divorced    Spouse Name: N/A    Number of Children: N/A  . Years of Education: N/A   Occupational History  . Not on file.   Social History Main Topics  . Smoking status: Never Smoker   . Smokeless tobacco: Never Used  . Alcohol Use: Yes     Comment: 1 glass of wine/night  . Drug Use: No  . Sexual Activity: Yes   Other Topics Concern  . Not on file   Social History  Narrative  . No narrative on file    Family History  Problem Relation Age of Onset  . Heart attack Father 32    Past Medical History  Diagnosis Date  . Carotid artery disease     40-59% bilateral, doppler December 2010  . Shellfish allergy   . PVC (premature ventricular contraction)     per prior Holter monitor  . Aortic valve sclerosis     echo 40/9811, soft systolic murmur  . Dyslipidemia   . Coronary artery disease     a. BMS-mid RCA 11/2007 b. stress echo 02/2009 subtle inferior HK, lateral ST depressions with stress c. follow-up cath 02/2009: RCA stent patent, severe but stable stenosis of distal posterior lateral branch RCA. LV normal, med tx unless more sx c. ETT Myoview 08/27/12: negative for ischemia; EF 64%  . Right hip pain 12/2009  . Ejection fraction     EF normal, catheterization, 2011 /  EF 55%, echo, 2010  . Fluid overload     Mild, stable since hospitalization in the past  . Depression     Mild, August,  2012    Past Surgical History  Procedure Laterality Date  . Coronary angioplasty with stent placement  11/2007    BMS-mid RCA  . Cardiac catheterization  1/211    Patent RCA stent, stenotic PLB supplying small distribution; medically managed    Patient Active Problem List   Diagnosis Date Noted  . Palpitations 08/27/2012  . Fatigue 08/27/2012  . Sinus bradycardia 08/27/2012  . Depression   . Carotid artery disease   . PVC (premature ventricular contraction)   . Aortic valve sclerosis   . Dyslipidemia   . Coronary artery disease   . CAD (coronary artery disease)   . Ejection fraction   . Fluid overload     ROS   Patient denies fever, chills, headache, sweats, rash, change in vision, change in hearing, chest pain, cough, nausea or vomiting, urinary symptoms. All other systems are reviewed and are negative other than the history of present illness.  PHYSICAL EXAM  Patient is overweight. She is oriented to person time and place. Affect is normal.  Head is atraumatic. Sclera and conjunctiva are normal. There is no jugulovenous distention. Lungs are clear. Respiratory effort is nonlabored. Cardiac exam reveals S1 and S2. The abdomen is soft. There is no peripheral edema. There are no musculoskeletal deformities. There are no skin rashes.  Filed Vitals:   12/13/13 1434  BP: 116/70  Pulse: 55  Height: 5\' 1"  (1.549 m)  Weight: 181 lb (82.101 kg)   EKG is done today and reviewed by me. There is no change. There is mild nonspecific ST-T changes.  ASSESSMENT & PLAN

## 2013-12-13 NOTE — Assessment & Plan Note (Signed)
Fortunately her PVC frequently decreased greatly. No further workup.

## 2013-12-13 NOTE — Patient Instructions (Addendum)
**Note De-Identified Layney Gillson Obfuscation** Your physician has recommended you make the following change in your medication: Try Aleve instead of Mobic for pain.  Your physician recommends that you return for lab work in: soon- please do not eat or drink after midnight the night before lab is drawn.  Your physician wants you to follow-up in: 6 months. You will receive a reminder letter in the mail two months in advance. If you don't receive a letter, please call our office to schedule the follow-up appointment.

## 2013-12-13 NOTE — Assessment & Plan Note (Signed)
She has been on Crestor 20. We do not have labs for greater than one year. These will be checked. If she's not at goal I will consider a higher dose of Crestor.

## 2013-12-13 NOTE — Assessment & Plan Note (Signed)
Coronary disease is stable at this time. No change in therapy. 

## 2013-12-13 NOTE — Assessment & Plan Note (Signed)
She's having significant hip pain. We had a careful discussion about nonsteroidal anti-inflammatory meds. Naproxen sodium (Aleve) is theoretically a little safer than Mobic.  She will try this. She will use as little as possible.

## 2013-12-15 ENCOUNTER — Ambulatory Visit (HOSPITAL_COMMUNITY): Payer: 59 | Attending: Internal Medicine | Admitting: Cardiology

## 2013-12-15 ENCOUNTER — Encounter: Payer: Self-pay | Admitting: Cardiology

## 2013-12-15 DIAGNOSIS — I10 Essential (primary) hypertension: Secondary | ICD-10-CM | POA: Insufficient documentation

## 2013-12-15 DIAGNOSIS — Z951 Presence of aortocoronary bypass graft: Secondary | ICD-10-CM | POA: Insufficient documentation

## 2013-12-15 DIAGNOSIS — I2510 Atherosclerotic heart disease of native coronary artery without angina pectoris: Secondary | ICD-10-CM | POA: Diagnosis not present

## 2013-12-15 DIAGNOSIS — I6523 Occlusion and stenosis of bilateral carotid arteries: Secondary | ICD-10-CM

## 2013-12-15 DIAGNOSIS — E785 Hyperlipidemia, unspecified: Secondary | ICD-10-CM | POA: Diagnosis not present

## 2013-12-15 NOTE — Progress Notes (Signed)
Carotid duplex performed 

## 2013-12-16 ENCOUNTER — Other Ambulatory Visit: Payer: Self-pay | Admitting: Cardiology

## 2013-12-18 ENCOUNTER — Telehealth: Payer: Self-pay | Admitting: Nurse Practitioner

## 2013-12-18 DIAGNOSIS — J208 Acute bronchitis due to other specified organisms: Secondary | ICD-10-CM

## 2013-12-18 MED ORDER — BENZONATATE 100 MG PO CAPS: 100.0000 mg | ORAL_CAPSULE | Freq: Two times a day (BID) | ORAL | Status: DC | PRN

## 2013-12-18 NOTE — Progress Notes (Signed)
We are sorry that you are not feeling well.  Here is how we plan to help!  Based on what you have shared with me it looks like you have upper respiratory tract inflammation that has resulted in a signification cough.  Inflammation and infection in the upper respiratory tract is commonly called bronchitis and has four common causes:  Allergies, Viral Infections, Acid Reflux and Bacterial Infections.  Allergies, viruses and acid reflux are treated by controlling symptoms or eliminating the cause. An example might be a cough caused by taking certain blood pressure medications. You stop the cough by changing the medication. Another example might be a cough caused by acid reflux. Controlling the reflux helps control the cough.  Based on your presentation I believe you most likely have A cough due to a virus.  This is called viral bronchitis and is best treated by rest, plenty of fluids and control of the cough.  You may use Ibuprofen or Tylenol as directed to help your symptoms.    In addition you may use A prescription cough medication called Tessalon Perles 100mg . You may take 1-2 capsules every 8 hours as needed for your cough.    HOME CARE   Only take medications as instructed by your medical team.   Complete the entire course of an antibiotic.   Drink plenty of fluids and get plenty of rest.   Avoid close contacts especially the very young and the elderly   Cover your mouth if you cough or cough into your sleeve.   Always remember to wash your hands   A steam or ultrasonic humidifier can help congestion.    GET HELP RIGHT AWAY IF:   You develop worsening fever.   You become short of breath   You cough up blood.   Your symptoms persist after you have completed your treatment plan MAKE SURE YOU   Understand these instructions.  Will watch your condition.  Will get help right away if you are not doing well or get worse.  Your e-visit answers were reviewed by a board certified advanced  clinical practitioner to complete your personal care plan.  Depending on the condition, your plan could have included both over the counter or prescription medications.  Please review your pharmacy choice.  If there is a problem, you may call our nursing hot line at [phone] and have the prescription routed to another pharmacy.  Your safety is important to [place].  If you have drug allergies check your prescription carefully.    You can use MyChart to ask questions about today's visit, request a non-urgent call back, or ask for a work or school excuse.  You will get an e-mail in the next two days asking about your experience.  I hope that your e-visit has been valuable and will speed your recovery. Thank you for using e-visits.

## 2013-12-24 ENCOUNTER — Other Ambulatory Visit: Payer: Self-pay

## 2013-12-24 MED ORDER — ROSUVASTATIN CALCIUM 40 MG PO TABS: 40.0000 mg | ORAL_TABLET | Freq: Every day | ORAL | Status: DC

## 2014-02-01 ENCOUNTER — Other Ambulatory Visit: Payer: Self-pay | Admitting: Orthopedic Surgery

## 2014-02-01 DIAGNOSIS — M25551 Pain in right hip: Secondary | ICD-10-CM

## 2014-02-01 DIAGNOSIS — M1611 Unilateral primary osteoarthritis, right hip: Secondary | ICD-10-CM

## 2014-02-03 ENCOUNTER — Ambulatory Visit
Admission: RE | Admit: 2014-02-03 | Discharge: 2014-02-03 | Disposition: A | Discharge status: Home / Self Care | Payer: 59 | Source: Ambulatory Visit | Attending: Orthopedic Surgery | Admitting: Orthopedic Surgery

## 2014-02-03 DIAGNOSIS — M1611 Unilateral primary osteoarthritis, right hip: Secondary | ICD-10-CM

## 2014-02-03 DIAGNOSIS — M25551 Pain in right hip: Secondary | ICD-10-CM

## 2014-02-03 MED ORDER — METHYLPREDNISOLONE ACETATE 40 MG/ML INJ SUSP (RADIOLOG
120.0000 mg | Freq: Once | INTRAMUSCULAR | Status: AC
  Administered 2014-02-03: 120 mg via INTRA_ARTICULAR

## 2014-02-03 MED ORDER — IOHEXOL 180 MG/ML  SOLN
1.0000 mL | Freq: Once | INTRAMUSCULAR | Status: AC | PRN
  Administered 2014-02-03: 1 mL via INTRA_ARTICULAR

## 2014-03-11 ENCOUNTER — Ambulatory Visit: Payer: 59 | Admitting: Family Medicine

## 2014-03-17 ENCOUNTER — Encounter: Payer: Self-pay | Admitting: Family Medicine

## 2014-03-17 ENCOUNTER — Ambulatory Visit (INDEPENDENT_AMBULATORY_CARE_PROVIDER_SITE_OTHER): Payer: 59 | Admitting: Family Medicine

## 2014-03-17 VITALS — BP 122/84 | HR 63 | Ht 61.0 in | Wt 184.0 lb

## 2014-03-17 DIAGNOSIS — M25559 Pain in unspecified hip: Secondary | ICD-10-CM

## 2014-03-17 DIAGNOSIS — M9902 Segmental and somatic dysfunction of thoracic region: Secondary | ICD-10-CM

## 2014-03-17 DIAGNOSIS — M999 Biomechanical lesion, unspecified: Secondary | ICD-10-CM

## 2014-03-17 DIAGNOSIS — M542 Cervicalgia: Secondary | ICD-10-CM

## 2014-03-17 DIAGNOSIS — M9904 Segmental and somatic dysfunction of sacral region: Secondary | ICD-10-CM

## 2014-03-17 DIAGNOSIS — M9901 Segmental and somatic dysfunction of cervical region: Secondary | ICD-10-CM

## 2014-03-17 MED ORDER — CYCLOBENZAPRINE HCL 10 MG PO TABS: 10.0000 mg | ORAL_TABLET | Freq: Every day | ORAL | Status: DC

## 2014-03-17 MED ORDER — DICLOFENAC SODIUM 2 % TD SOLN
TRANSDERMAL | Status: DC
Start: 2014-03-17 — End: 2014-04-07

## 2014-03-17 NOTE — Progress Notes (Signed)
Courtney [name], Courtney [name] [address] Phone: [phone] Subjective:    I'm seeing this patient by the request  of:  Osborne Casco, MD   CC: Upper back and hip pain  JME:Courtney [name] is a 64 y.o. female coming in with complaint of upper back as well as hip pain.  Regarding patient's hip pain patient has had a fairly thorough workup for this by another provider. Patient did have an MRI of the hip due to the continued pain in patient did have moderate osteophytic changes. In addition of this patient did on December 17 have an injection in the hip. Patient states that the injection has helped. He did take approximately 2 weeks to start helping. Still having some mild discomfort and still has times when it gives like it is going to give way on her. Patient avoids stairs secondary to the amount of pain. States that most of the pain still seems to be in the groin area. Denies radiation down the leg or any numbness or tingling.  Patient also is complaining of upper back pain. Patient has had a history back in 2011 of having an MRI with an L4-L5 disc protrusion. Patient states that the pain is more in her upper back. Patient states it seems to be in the shoulder blades. Started approximately 3 weeks ago. Does not remember any type of injury or any recent changes. Patient denies any change in medications for any increase activity of any sort. Patient denies radiation down the arm or any numbness or weakness. States that it seems be localized. Patient rates the severity is 4 out of 10.     Past medical history, social, surgical and family history all reviewed in electronic medical record.   Review of Systems: No headache, visual changes, nausea, vomiting, diarrhea, constipation, dizziness, abdominal pain, skin rash, fevers, chills, night sweats, weight loss, swollen lymph nodes, body aches, joint swelling, muscle aches, chest pain,  shortness of breath, mood changes.   Objective Blood pressure 122/84, pulse 63, height 5\' 1"  (1.549 m), weight 184 lb (83.462 kg), SpO2 95 %.  General: No apparent distress alert and oriented x3 mood and affect normal, dressed appropriately.  HEENT: Pupils equal, extraocular movements intact  Respiratory: Patient's speak in full sentences and does not appear short of breath  Cardiovascular: No lower extremity edema, non tender, no erythema  Skin: Warm dry intact with no signs of infection or rash on extremities or on axial skeleton.  Abdomen: Soft nontender  Neuro: Cranial nerves II through XII are intact, neurovascularly intact in all extremities with 2+ DTRs and 2+ pulses.  Lymph: No lymphadenopathy of posterior or anterior cervical chain or axillae bilaterally.  Gait normal with good balance and coordination.  MSK:  Non tender with full range of motion and good stability and symmetric strength and tone of shoulders, elbows, wrist, , knee and ankles bilaterally.  Upper back exam shows the patient does have tightness of the trapezius bilaterally left greater than right. Mild trigger points noted. Patient though does have full range in motion of the neck. Neck: Inspection unremarkable. No palpable stepoffs. Negative Spurling's maneuver. Full neck range of motion Grip strength and sensation normal in bilateral hands Strength good C4 to T1 distribution No sensory change to C4 to T1 Negative Hoffman sign bilaterally Reflexes normal Hip: Right ROM IR: 35 Deg with mild decrease in range of motion compared to the contralateral side, ER: 45 Deg, Flexion: 120  Deg, Extension: 100 Deg, Abduction: 45 Deg, Adduction: 45 Deg Strength IR: 5/5, ER: 5/5, Flexion: 5/5, Extension: 5/5, Abduction: 4/5, Adduction: 5/5 Pelvic alignment unremarkable to inspection and palpation. Standing hip rotation and gait without trendelenburg sign / unsteadiness. Greater trochanter without tenderness to palpation. No  tenderness over piriformis and greater trochanter.  pain with FABER and FADIR. Minimal right-sided tenderness of the SI joint Functional short leg on right  Osteopathic findings Cervical C4 flexed rotated and side bent right Thoracic T1 extended rotated and side bent right T3 extended rotated and side bent left Lumbar L2 flexed rotated and side bent right  Procedure note 97110; 15 minutes spent for Therapeutic exercises as stated in above notes.  This included exercises focusing on stretching, strengthening, with significant focus on eccentric aspects.   Proper technique shown and discussed handout in great detail with ATC.  All questions were discussed and answered.     Impression and Recommendations:     This case required medical decision making of moderate complexity.

## 2014-03-17 NOTE — Patient Instructions (Addendum)
Good to see you.  Ice 20 minutes 2 times daily. Usually after activity and before bed. Exercises 3 times a week. Alternate hip  With sitting in chair put tennisball between shoulder blades Keep monitor at eye level On wall stand with heels, butt shoulder and head touching 5 minutes a day Flexeril when you need it.  Turmeric is great.  Vitamin D at least 2000 IU daily.  Fish oil 2 grams daily.  Heel lift in right shoe  See me again in 3 weeks.

## 2014-03-17 NOTE — Assessment & Plan Note (Signed)
Decision today to treat with OMT was based on Physical Exam  After verbal consent patient was treated with HVLA, ME techniques in local, thoracic and sacral areas  Patient tolerated the procedure well with improvement in symptoms  Patient given exercises, stretches and lifestyle modifications  See medications in patient instructions if given  Patient will follow up in 3 weeks

## 2014-03-17 NOTE — Assessment & Plan Note (Signed)
Patient's pain is still likely secondary to moderate osteophytic changes that haven't seen on MRI previously but I do think there is a possibility for a snapping hip syndrome as well. Patient was given exercises with this and went over with the athletic trainer in greater detail. We discussed over-the-counter medications in patient is going try topical anti-inflammatories. We discussed what activities to potentially avoid as well as trying a heel lift for the leg length discrepancy. Patient will try to make these changes and come back and see me again in 3-4 weeks for further evaluation.

## 2014-03-17 NOTE — Assessment & Plan Note (Signed)
Patient's neck pain seems to be bilateral and does goes into the trapezius with some muscle tightness. Prescription for Flexeril was given and will be used intermittently. I do think that topical anti-inflammatories may be helpful. In addition of this we discussed home exercises and postural changes that could be more beneficial. We discussed the ergonomic changes at the desk. Patient did respond fairly well to osteopathic supination. Patient come back again in 3-4 weeks.

## 2014-03-17 NOTE — Progress Notes (Signed)
Pre visit review using our clinic review tool, if applicable. No additional management support is needed unless otherwise documented below in the visit note. 

## 2014-04-07 ENCOUNTER — Encounter: Payer: Self-pay | Admitting: Family Medicine

## 2014-04-07 ENCOUNTER — Ambulatory Visit (INDEPENDENT_AMBULATORY_CARE_PROVIDER_SITE_OTHER): Payer: 59 | Admitting: Family Medicine

## 2014-04-07 VITALS — BP 136/82 | HR 65 | Ht 61.0 in | Wt 179.0 lb

## 2014-04-07 DIAGNOSIS — M9901 Segmental and somatic dysfunction of cervical region: Secondary | ICD-10-CM

## 2014-04-07 DIAGNOSIS — M9902 Segmental and somatic dysfunction of thoracic region: Secondary | ICD-10-CM

## 2014-04-07 DIAGNOSIS — M546 Pain in thoracic spine: Secondary | ICD-10-CM | POA: Insufficient documentation

## 2014-04-07 DIAGNOSIS — M9904 Segmental and somatic dysfunction of sacral region: Secondary | ICD-10-CM

## 2014-04-07 DIAGNOSIS — M999 Biomechanical lesion, unspecified: Secondary | ICD-10-CM

## 2014-04-07 MED ORDER — DICLOFENAC SODIUM 2 % TD SOLN: TRANSDERMAL | Status: DC

## 2014-04-07 NOTE — Patient Instructions (Signed)
Keep doing what you are doing.  Continue the vitamins Continue to work with the posture  Ice when you need it Call on the pennsaid See me again in 4 weeks.

## 2014-04-07 NOTE — Assessment & Plan Note (Signed)
Decision today to treat with OMT was based on Physical Exam  After verbal consent patient was treated with HVLA, ME techniques in local, thoracic and sacral areas  Patient tolerated the procedure well with improvement in symptoms  Patient given exercises, stretches and lifestyle modifications  See medications in patient instructions if given  Patient will follow up in 3 weeks

## 2014-04-07 NOTE — Progress Notes (Signed)
[name] [address], [place] [address] Phone: [phone] Subjective:    I'm seeing this patient by the request  of:  Osborne Casco, MD   CC: Upper back and hip pain  Courtney [name] is a 64 y.o. female coming in with complaint of upper back as well as hip pain.  Regarding patient's hip pain patient has had a fairly thorough workup for this by another provider. Patient did have an MRI of the hip due to the continued pain in patient did have moderate osteophytic changes. In addition of this patient did on December 17 have an injection in the hip. Patient states that the injection has helped. Patient was also given exercises for the possibility of a snapping hip syndrome. Patient continue taking meloxicam. Patient states hip is doing very well.  Patient also is complaining of upper back pain. Patient has had a history back in 2011 of having an MRI with an L4-L5 disc protrusion. Patient states that the pain is more in her upper back. Patient states it seems to be in the shoulder blades. Patient did have osteopathic manipulation. Patient states that she is doing better but the pain seemed to start coming back again after approximately 2 weeks. Patient states that it is more of a dull throbbing pain. Denies any true changes. States though that she is still approximately 30% better.     Past medical history, social, surgical and family history all reviewed in electronic medical record.   Review of Systems: No headache, visual changes, nausea, vomiting, diarrhea, constipation, dizziness, abdominal pain, skin rash, fevers, chills, night sweats, weight loss, swollen lymph nodes, body aches, joint swelling, muscle aches, chest pain, shortness of breath, mood changes.   Objective Blood pressure 136/82, pulse 65, height 5\' 1"  (1.549 m), weight 179 lb (81.194 kg), SpO2 99 %.  General: No apparent distress alert and oriented x3 mood and  affect normal, dressed appropriately.  HEENT: Pupils equal, extraocular movements intact  Respiratory: Patient's speak in full sentences and does not appear short of breath  Cardiovascular: No lower extremity edema, non tender, no erythema  Skin: Warm dry intact with no signs of infection or rash on extremities or on axial skeleton.  Abdomen: Soft nontender  Neuro: Cranial nerves II through XII are intact, neurovascularly intact in all extremities with 2+ DTRs and 2+ pulses.  Lymph: No lymphadenopathy of posterior or anterior cervical chain or axillae bilaterally.  Gait normal with good balance and coordination.  MSK:  Non tender with full range of motion and good stability and symmetric strength and tone of shoulders, elbows, wrist, , knee and ankles bilaterally.  Upper back exam shows the patient does have tightness of the trapezius bilaterally left greater than right. Mild trigger points noted. Patient though does have full range in motion of the neck. Neck: Inspection unremarkable. No palpable stepoffs. Negative Spurling's maneuver. Full neck range of motion Grip strength and sensation normal in bilateral hands Strength good C4 to T1 distribution No sensory change to C4 to T1 Negative Hoffman sign bilaterally Reflexes normal   Osteopathic findings Cervical C4 flexed rotated and side bent right Thoracic T1 extended rotated and side bent right T3 extended rotated and side bent left T8 extended rotated and side bent right with inhaled eighth rib Lumbar L2 flexed rotated and side bent right      Impression and Recommendations:     This case required medical decision making of moderate complexity.

## 2014-04-07 NOTE — Assessment & Plan Note (Signed)
Patient is more of a thoracic back pain. Patient's is multifactorial. Patient will continue to work with the postural changes. We discussed other back exercises that might be beneficial. Patient and will come back and see me again in 3-4 weeks.

## 2014-04-07 NOTE — Progress Notes (Signed)
Pre visit review using our clinic review tool, if applicable. No additional management support is needed unless otherwise documented below in the visit note. 

## 2014-04-28 ENCOUNTER — Encounter: Payer: Self-pay | Admitting: Cardiology

## 2014-04-29 NOTE — Telephone Encounter (Deleted)
Ms. Bleiler, The last Lipid results that we have in your chart are from 12/17/13 and it looks like those labs where drawn at another facility and faxed to [place]. When this is the case, the facility that drew the lab will fax the results to the provider ([place]) and we scan them into the pts chart. Unfortunately, when documents are scanned in they cannot be seen in [facility]. If you would like a copy of that lab work I will be more than happy to mail you a copy to your address. Please let me know if you do and I will mail today.  Also, I did receive your second [facility] message concerning your leg pain and I will forward that message to Dr Ron Parker to address when he returns to the office next week (he is on vacation at this time). In the mean time, please contact your primary care provider if that pain worsens as there could be another reason for that pain.  Thanks, Jeani Hawking (Dr Kae Heller nurse)

## 2014-05-05 ENCOUNTER — Ambulatory Visit: Payer: 59 | Admitting: Family Medicine

## 2014-05-09 ENCOUNTER — Telehealth: Payer: Self-pay

## 2014-05-09 DIAGNOSIS — I739 Peripheral vascular disease, unspecified: Secondary | ICD-10-CM

## 2014-05-09 NOTE — Telephone Encounter (Signed)
The pt sent [place] a [facility] message stating that she has been having leg pain and wanted to know if Dr Ron Parker feels that she needs to have a doppler done. Per Dr Ron Parker the pt should be scheduled to have a Lower extremity Artrial Doppler with DX of claudication.  I have left a meassge on the pts cell phone VM and I sent a message through [facility] asking her to call me to discuss.

## 2014-05-10 NOTE — Telephone Encounter (Signed)
F/U ° ° ° ° ° ° ° ° °Pt returning call from yesterday. Please call back °

## 2014-05-10 NOTE — Telephone Encounter (Signed)
Spoke with pt and informed her that Dr. Ron Parker was in agreement with having doppler done. Informed pt that I would put in order and someone from our [facility] team would be in contact with her to schedule this appt. Pt verbalized understanding and was in agreement with this plan.

## 2014-05-10 NOTE — Telephone Encounter (Signed)
Left message to call back  

## 2014-05-12 ENCOUNTER — Other Ambulatory Visit (HOSPITAL_COMMUNITY)
Admission: AD | Admit: 2014-05-12 | Discharge: 2014-05-12 | Disposition: A | Discharge status: Home / Self Care | Payer: 59 | Source: Ambulatory Visit | Attending: Family Medicine | Admitting: Family Medicine

## 2014-05-12 ENCOUNTER — Encounter: Payer: Self-pay | Admitting: Family Medicine

## 2014-05-12 ENCOUNTER — Ambulatory Visit (INDEPENDENT_AMBULATORY_CARE_PROVIDER_SITE_OTHER): Payer: 59 | Admitting: Family Medicine

## 2014-05-12 VITALS — BP 108/80 | HR 61 | Ht 61.0 in | Wt 183.0 lb

## 2014-05-12 DIAGNOSIS — M9901 Segmental and somatic dysfunction of cervical region: Secondary | ICD-10-CM

## 2014-05-12 DIAGNOSIS — E785 Hyperlipidemia, unspecified: Secondary | ICD-10-CM | POA: Insufficient documentation

## 2014-05-12 DIAGNOSIS — M546 Pain in thoracic spine: Secondary | ICD-10-CM

## 2014-05-12 DIAGNOSIS — M9902 Segmental and somatic dysfunction of thoracic region: Secondary | ICD-10-CM | POA: Diagnosis not present

## 2014-05-12 DIAGNOSIS — M9904 Segmental and somatic dysfunction of sacral region: Secondary | ICD-10-CM

## 2014-05-12 DIAGNOSIS — M25559 Pain in unspecified hip: Secondary | ICD-10-CM | POA: Diagnosis not present

## 2014-05-12 DIAGNOSIS — M999 Biomechanical lesion, unspecified: Secondary | ICD-10-CM

## 2014-05-12 LAB — COMPREHENSIVE METABOLIC PANEL
ALK PHOS: 78 U/L (ref 39–117)
ALT: 18 U/L (ref 0–35)
AST: 21 U/L (ref 0–37)
Albumin: 4.1 g/dL (ref 3.5–5.2)
Anion gap: 7 (ref 5–15)
BUN: 17 mg/dL (ref 6–23)
CHLORIDE: 104 mmol/L (ref 96–112)
CO2: 28 mmol/L (ref 19–32)
Calcium: 9.6 mg/dL (ref 8.4–10.5)
Creatinine, Ser: 0.82 mg/dL (ref 0.50–1.10)
GFR calc Af Amer: 85 mL/min — ABNORMAL LOW (ref >90)
GFR, EST NON AFRICAN AMERICAN: 73 mL/min — AB (ref >90)
GLUCOSE: 79 mg/dL (ref 70–99)
POTASSIUM: 4.5 mmol/L (ref 3.5–5.1)
SODIUM: 139 mmol/L (ref 135–145)
Total Bilirubin: 0.3 mg/dL (ref 0.3–1.2)
Total Protein: 7.4 g/dL (ref 6.0–8.3)

## 2014-05-12 NOTE — Progress Notes (Signed)
  [name] [address], [place] [address] Phone: [phone] Subjective:    I'm seeing this patient by the request  of:  Osborne Casco, MD   CC: Upper back and hip pain  Courtney [name] is a 64 y.o. female coming in with complaint of upper back    Patient also is complaining of upper back pain. Patient has had a history back in 2011 of having an MRI with an L4-L5 disc protrusion. Patient states that the pain is more in her upper back. Patient was having upper back pain and has been responding well to conservative therapy. Patient was also been having osteopathic manipulation. Patient was given exercises, we discussed over-the-counter natural supplementation, as well as an icing protocol. Patient statesher upper back pain was doing better but has been in under stress.   patient is having continued hip pain with known moderate arthritis. Patient is wanting to discuss with a Psychologist, sport and exercise.  Past medical history, social, surgical and family history all reviewed in electronic medical record.   Review of Systems: No headache, visual changes, nausea, vomiting, diarrhea, constipation, dizziness, abdominal pain, skin rash, fevers, chills, night sweats, weight loss, swollen lymph nodes, body aches, joint swelling, muscle aches, chest pain, shortness of breath, mood changes.   Objective Blood pressure 108/80, pulse 61, height 5\' 1"  (1.549 m), weight 183 lb (83.008 kg), SpO2 97 %.  General: No apparent distress alert and oriented x3 mood and affect normal, dressed appropriately.  HEENT: Pupils equal, extraocular movements intact  Respiratory: Patient's speak in full sentences and does not appear short of breath  Cardiovascular: No lower extremity edema, non tender, no erythema  Skin: Warm dry intact with no signs of infection or rash on extremities or on axial skeleton.  Abdomen: Soft nontender  Neuro: Cranial nerves II through XII are  intact, neurovascularly intact in all extremities with 2+ DTRs and 2+ pulses.  Lymph: No lymphadenopathy of posterior or anterior cervical chain or axillae bilaterally.  Gait normal with good balance and coordination.  MSK:  Non tender with full range of motion and good stability and symmetric strength and tone of shoulders, elbows, wrist, , knee and ankles bilaterally.  Continued tightness of the trapezius bilaterally Neck: Inspection unremarkable. No palpable stepoffs. Negative Spurling's maneuver. Full neck range of motionmild more tightness than previous exam. Grip strength and sensation normal in bilateral hands Strength good C4 to T1 distribution No sensory change to C4 to T1 Negative Hoffman sign bilaterally Reflexes normal   Osteopathic findings Cervical C4 flexed rotated and side bent right Thoracic T1 extended rotated and side bent right elevated first rib T3 extended rotated and side bent left T8 extended rotated and side bent right  Lumbar L2 flexed rotated and side bent right      Impression and Recommendations:     This case required medical decision making of moderate complexity.

## 2014-05-12 NOTE — Assessment & Plan Note (Signed)
Decision today to treat with OMT was based on Physical Exam  After verbal consent patient was treated with HVLA, ME techniques in local, thoracic and sacral areas  Patient tolerated the procedure well with improvement in symptoms  Patient given exercises, stretches and lifestyle modifications  See medications in patient instructions if given  Patient will follow up in 5-6 weeks

## 2014-05-12 NOTE — Progress Notes (Signed)
Pre visit review using our clinic review tool, if applicable. No additional management support is needed unless otherwise documented below in the visit note. 

## 2014-05-12 NOTE — Patient Instructions (Addendum)
Good to see you Talk to Dr. Rhona Raider on the hip and see what he can do.  Keep working on Printmaker on the wall heels, butt shoulder and head touching for goal of 5 minutes a day Continue the exercises See me again in 5-6 weeks.

## 2014-05-12 NOTE — Assessment & Plan Note (Signed)
Patient's thoracic back pain is multifactorial secondary to patient's poor posture, breast tissue, as well as muscle imbalances. Patient is to make some changes with home exercises as well as patient was given another ergonomic changes that might be beneficial. Patient will try to make these changes and come back and see me again in 5-6 weeks. This is still responding to osteopathic manipulation.

## 2014-05-12 NOTE — Assessment & Plan Note (Signed)
Patient has known osteoarthritic changes of the hip. Discuss patient possibly bringing me the MRI to further evaluate. We discussed possible surgical intervention. Patient was referred to an orthopedic surgeon for further evaluation and treatment.

## 2014-05-17 ENCOUNTER — Ambulatory Visit (HOSPITAL_COMMUNITY): Payer: 59 | Attending: Cardiology | Admitting: *Deleted

## 2014-05-17 DIAGNOSIS — I739 Peripheral vascular disease, unspecified: Secondary | ICD-10-CM | POA: Diagnosis not present

## 2014-05-17 NOTE — Progress Notes (Signed)
Lower Extremity Arterial Doppler Complete - Performed

## 2014-06-13 ENCOUNTER — Ambulatory Visit (INDEPENDENT_AMBULATORY_CARE_PROVIDER_SITE_OTHER): Payer: 59 | Admitting: Cardiology

## 2014-06-13 ENCOUNTER — Encounter: Payer: Self-pay | Admitting: Cardiology

## 2014-06-13 VITALS — BP 140/80 | HR 80 | Ht 61.0 in | Wt 185.2 lb

## 2014-06-13 DIAGNOSIS — I779 Disorder of arteries and arterioles, unspecified: Secondary | ICD-10-CM | POA: Diagnosis not present

## 2014-06-13 DIAGNOSIS — Z01810 Encounter for preprocedural cardiovascular examination: Secondary | ICD-10-CM | POA: Insufficient documentation

## 2014-06-13 DIAGNOSIS — E785 Hyperlipidemia, unspecified: Secondary | ICD-10-CM

## 2014-06-13 DIAGNOSIS — I2510 Atherosclerotic heart disease of native coronary artery without angina pectoris: Secondary | ICD-10-CM | POA: Diagnosis not present

## 2014-06-13 DIAGNOSIS — I358 Other nonrheumatic aortic valve disorders: Secondary | ICD-10-CM | POA: Diagnosis not present

## 2014-06-13 DIAGNOSIS — I493 Ventricular premature depolarization: Secondary | ICD-10-CM

## 2014-06-13 DIAGNOSIS — I739 Peripheral vascular disease, unspecified: Secondary | ICD-10-CM

## 2014-06-13 NOTE — Patient Instructions (Addendum)
Medication Instructions:  None  Labwork: None  Testing/Procedures: None  Follow-Up: Your physician wants you to follow-up in: 1 year. You will receive a reminder letter in the mail two months in advance. If you don't receive a letter, please call our office to schedule the follow-up appointment.   Any Other Special Instructions Will Be Listed Below (If Applicable). You have been cleared for surgery

## 2014-06-13 NOTE — Assessment & Plan Note (Signed)
Carotids are stable. Her Dopplers are followed carefully. The last in October, 2015 revealed stable 40-59% bilateral disease. There was slight increase in the velocity of the right subclavian.

## 2014-06-13 NOTE — Assessment & Plan Note (Signed)
Patient is on guideline directed therapy.

## 2014-06-13 NOTE — Assessment & Plan Note (Signed)
Patient had a bare metal stent to the RCA in 2009. Because of a subtle abnormality on the stress study repeat catheterization was done in 2011. Stent was patent. There was stable stenosis of the distal posterolateral branch of the RCA. Nuclear study in July, 2014 revealed normal ejection fraction and no scar or ischemia. She needs no further workup at this time.

## 2014-06-13 NOTE — Assessment & Plan Note (Signed)
Patient has aortic valve sclerosis. She does not need a follow-up echo at this time.

## 2014-06-13 NOTE — Assessment & Plan Note (Signed)
In 2014 she had some palpitations. She had high-volume PVCs. I was concerned about this and this was followed. However follow-up study showed significant decrease in her PVCs. No further workup.

## 2014-06-13 NOTE — Assessment & Plan Note (Signed)
The patient is stable for her hip surgery. She has had a stress study within the past 5 years. She is very active and exercises more than 4 Mets. There is no recent unstable angina and no recent CHF. She is cleared for her hip surgery. No further workup is needed. The choice of anesthesia will be up to the surgical and anesthesia team.  As part of today's evaluation I spent greater than 25 minutes with her total care. More than half of this time is been with direct contact with her. We've also talked about and make decisions about proceeding with her hip surgery.

## 2014-06-13 NOTE — Progress Notes (Signed)
Cardiology Office Note   Date:  06/13/2014   ID:  Courtney [name], DOB March 10, 1950, MRN [id number]  PCP:  Osborne Casco, MD  Cardiologist:  Dola Argyle, MD   Chief Complaint  Patient presents with  . Appointment    Follow-up coronary artery disease. Preop clearance for hip surgery      History of Present Illness: Courtney [name] is a 64 y.o. female who presents to follow up coronary disease. She is also here for preop clearance for hip surgery on Jul 08, 2014. She is stable. She has known coronary disease. Her last nuclear study was in 2014. Ejection fraction was normal and there was no ischemia. She is not having any chest pain or shortness of breath. She is active. She needs hip surgery.  From our discussion today the patient is aware that I will be retiring. She requests follow-up with Dr. Peter Martinique. We will make this arrangement.    Past Medical History  Diagnosis Date  . Carotid artery disease     40-59% bilateral, doppler December 2010  . Shellfish allergy   . PVC (premature ventricular contraction)     per prior Holter monitor  . Aortic valve sclerosis     echo 73/5329, soft systolic murmur  . Dyslipidemia   . Coronary artery disease     a. BMS-mid RCA 11/2007 b. stress echo 02/2009 subtle inferior HK, lateral ST depressions with stress c. follow-up cath 02/2009: RCA stent patent, severe but stable stenosis of distal posterior lateral branch RCA. LV normal, med tx unless more sx c. ETT Myoview 08/27/12: negative for ischemia; EF 64%  . Right hip pain 12/2009  . Ejection fraction     EF normal, catheterization, 2011 /  EF 55%, echo, 2010  . Fluid overload     Mild, stable since hospitalization in the past  . Depression     Mild, August, 2012    Past Surgical History  Procedure Laterality Date  . Coronary angioplasty with stent placement  11/2007    BMS-mid RCA  . Cardiac catheterization  1/211    Patent RCA stent, stenotic PLB supplying small  distribution; medically managed    Patient Active Problem List   Diagnosis Date Noted  . Pre-operative cardiovascular examination 06/13/2014  . Thoracic back pain 04/07/2014  . Neck pain 03/17/2014  . Nonallopathic lesion of cervical region 03/17/2014  . Nonallopathic lesion of thoracic region 03/17/2014  . Nonallopathic lesion of sacral region 03/17/2014  . Hip pain 12/13/2013  . Palpitations 08/27/2012  . Fatigue 08/27/2012  . Sinus bradycardia 08/27/2012  . Depression   . Carotid artery disease   . PVC (premature ventricular contraction)   . Aortic valve sclerosis   . Dyslipidemia   . Coronary artery disease   . CAD (coronary artery disease)   . Ejection fraction   . Fluid overload       Current Outpatient Prescriptions  Medication Sig Dispense Refill  . acetaminophen (TYLENOL) 500 MG tablet Take 1,000 mg by mouth every 6 (six) hours as needed.      [name] aspirin 325 MG tablet Take 325 mg by mouth daily.    . Diclofenac Sodium 2 % SOLN Apply 1 pump twice daily. 112 g 3  . meloxicam (MOBIC) 7.5 MG tablet Take 7.5 mg by mouth daily.    . metoprolol tartrate (LOPRESSOR) 25 MG tablet Take 0.5 tablets (12.5 mg total) by mouth 2 (two) times daily. 90 tablet 3  . nitroGLYCERIN (NITROSTAT) 0.4  MG SL tablet Place 0.4 mg under the tongue every 5 (five) minutes as needed for chest pain.    . Probiotic Product (PROBIOTIC DAILY) CAPS Take 1 capsule by mouth daily. Suprema Dophilus    . ramipril (ALTACE) 5 MG capsule Take 1 capsule (5 mg total) by mouth daily. 90 capsule 1  . rosuvastatin (CRESTOR) 40 MG tablet Take 1 tablet (40 mg total) by mouth daily. 90 tablet 3  . sertraline (ZOLOFT) 50 MG tablet Take 50 mg by mouth daily.       No current facility-administered medications for this visit.    Allergies:   Erythromycin and Penicillins    Social History:  The patient  reports that she has never smoked. She has never used smokeless tobacco. She reports that she drinks alcohol. She  reports that she does not use illicit drugs.   Family History:  The patient's family history includes Heart attack (age of onset: 30) in her father.    ROS:  Please see the history of present illness.     Patient denies fever, chills, headache, sweats, rash, change in vision, change in hearing, chest pain, cough, nausea or vomiting, urinary symptoms. All other systems are reviewed and are negative.   PHYSICAL EXAM: VS:  BP 140/80 mmHg  Pulse 80  Ht 5\' 1"  (1.549 m)  Wt 185 lb 3.2 oz (84.006 kg)  BMI 35.01 kg/m2  SpO2 98% , Patient is oriented to person time and place. Affect is normal. Head is atraumatic. Sclera and conjunctiva are normal. The patient is overweight. There is no jugular venous distention. Lungs are clear. Respiratory effort is nonlabored. Cardiac exam reveals S1 and S2. There is a very soft systolic murmur. Abdomen is soft. There is no peripheral edema. There are no musculoskeletal deformities. There are no skin rashes. Neurologic is grossly intact.   EKG:   I have reviewed her last EKG. There was no significant abnormality.   Recent Labs: 05/12/2014: ALT 18; BUN 17; Creatinine 0.82; Potassium 4.5; Sodium 139    Lipid Panel    Component Value Date/Time   CHOL 192 08/27/2012 0526   TRIG 119 08/27/2012 0526   HDL 72 08/27/2012 0526   CHOLHDL 2.7 08/27/2012 0526   VLDL 24 08/27/2012 0526   LDLCALC 96 08/27/2012 0526      Wt Readings from Last 3 Encounters:  06/13/14 185 lb 3.2 oz (84.006 kg)  05/12/14 183 lb (83.008 kg)  04/07/14 179 lb (81.194 kg)      Current medicines are reviewed  The patient understands her medications.     ASSESSMENT AND PLAN:

## 2014-06-16 ENCOUNTER — Encounter: Payer: Self-pay | Admitting: Family Medicine

## 2014-06-16 ENCOUNTER — Ambulatory Visit (INDEPENDENT_AMBULATORY_CARE_PROVIDER_SITE_OTHER): Payer: 59 | Admitting: Family Medicine

## 2014-06-16 VITALS — BP 130/80 | HR 67 | Ht 61.0 in | Wt 184.0 lb

## 2014-06-16 DIAGNOSIS — M546 Pain in thoracic spine: Secondary | ICD-10-CM | POA: Diagnosis not present

## 2014-06-16 DIAGNOSIS — M9901 Segmental and somatic dysfunction of cervical region: Secondary | ICD-10-CM | POA: Diagnosis not present

## 2014-06-16 DIAGNOSIS — M9904 Segmental and somatic dysfunction of sacral region: Secondary | ICD-10-CM

## 2014-06-16 DIAGNOSIS — M542 Cervicalgia: Secondary | ICD-10-CM

## 2014-06-16 DIAGNOSIS — M9902 Segmental and somatic dysfunction of thoracic region: Secondary | ICD-10-CM | POA: Diagnosis not present

## 2014-06-16 DIAGNOSIS — M999 Biomechanical lesion, unspecified: Secondary | ICD-10-CM

## 2014-06-16 NOTE — Assessment & Plan Note (Signed)
Patient given some other exercises and home exercises including postural changes that could be beneficial. Discussed some manual massage techniques.

## 2014-06-16 NOTE — Progress Notes (Signed)
Pre visit review using our clinic review tool, if applicable. No additional management support is needed unless otherwise documented below in the visit note. 

## 2014-06-16 NOTE — Assessment & Plan Note (Signed)
Patient continues to have some thoracic as well as cervical neck pain and it is more secondary to multifactorial muscle imbalances. Discussed icing and home exercises. Patient will continue to do this as well as work on the postural exercises. Patient and will follow-up with me again after her hip replacement and we will attempt to do more alignment.

## 2014-06-16 NOTE — Progress Notes (Signed)
  [name] [address] [place], [place] [address] Phone: [phone] Subjective:    CC: Upper back and hip pain  [id number] KALEESI Courtney [name] is a 64 y.o. female coming in with complaint of upper back patient is scheduled to have her hip replacement on May 20. Patient states that some of the stress at work is causing some mild increase in upper back pain. Patient states that overall though no significant new findings just more tightness than usual. Continues the over-the-counter natural supplementations and is only doing the exercises intermittently.      Past medical history, social, surgical and family history all reviewed in electronic medical record.   Review of Systems: No headache, visual changes, nausea, vomiting, diarrhea, constipation, dizziness, abdominal pain, skin rash, fevers, chills, night sweats, weight loss, swollen lymph nodes, body aches, joint swelling, muscle aches, chest pain, shortness of breath, mood changes.   Objective Blood pressure 130/80, pulse 67, weight 184 lb (83.462 kg), SpO2 96 %.  General: No apparent distress alert and oriented x3 mood and affect normal, dressed appropriately.  HEENT: Pupils equal, extraocular movements intact  Respiratory: Patient's speak in full sentences and does not appear short of breath  Cardiovascular: No lower extremity edema, non tender, no erythema  Skin: Warm dry intact with no signs of infection or rash on extremities or on axial skeleton.  Abdomen: Soft nontender  Neuro: Cranial nerves II through XII are intact, neurovascularly intact in all extremities with 2+ DTRs and 2+ pulses.  Lymph: No lymphadenopathy of posterior or anterior cervical chain or axillae bilaterally.  Gait normal with good balance and coordination.  MSK:  Non tender with full range of motion and good stability and symmetric strength and tone of shoulders, elbows, wrist, , knee and ankles bilaterally. Continues limited  motion of the right hip Continued tightness of the trapezius bilaterally Neck: Inspection unremarkable. No palpable stepoffs. Negative Spurling's maneuver. Continued tightness but full range of motion Grip strength and sensation normal in bilateral hands Strength good C4 to T1 distribution No sensory change to C4 to T1 Negative Hoffman sign bilaterally Reflexes normal   Osteopathic findings Cervical C4 flexed rotated and side bent right Thoracic T1 extended rotated and side bent right elevated first rib T3 extended rotated and side bent left T8 extended rotated and side bent right  Lumbar L2 flexed rotated and side bent right Sacrum left on left      Impression and Recommendations:     This case required medical decision making of moderate complexity.

## 2014-06-16 NOTE — Assessment & Plan Note (Signed)
Decision today to treat with OMT was based on Physical Exam  After verbal consent patient was treated with HVLA, ME techniques in local, thoracic and sacral areas  Patient tolerated the procedure well with improvement in symptoms  Patient given exercises, stretches and lifestyle modifications  See medications in patient instructions if given  Patient will follow up in 4-6 weeks for any time after patient's hip replacement.

## 2014-06-16 NOTE — Patient Instructions (Signed)
Good to see you Ice is your friend still Continue the pennsaid 2 times daily as needed You are going to do great with the hip See me again when you need me.

## 2014-06-17 ENCOUNTER — Other Ambulatory Visit: Payer: Self-pay | Admitting: Cardiology

## 2014-06-17 NOTE — Telephone Encounter (Signed)
Med was discontinued in error at recent ov. Should she still be taking this? Please advise. Thanks, MI

## 2014-06-17 NOTE — Telephone Encounter (Signed)
**Note De-Identified Courtney [name] Obfuscation** According to the pts last OV with Dr Ron Parker on 06/13/14 the pt was taking Ramipril 5 mg daily and Dr Ron Parker did not make any changes in her medications at that time.

## 2014-06-27 ENCOUNTER — Other Ambulatory Visit (HOSPITAL_COMMUNITY): Payer: Self-pay | Admitting: Orthopaedic Surgery

## 2014-06-28 ENCOUNTER — Other Ambulatory Visit (HOSPITAL_COMMUNITY): Payer: Self-pay | Admitting: Anesthesiology

## 2014-06-28 NOTE — Patient Instructions (Addendum)
Courtney [name]  06/28/2014   Your procedure is scheduled on: Friday 07/08/2014  Report to [facility] Main  Entrance and follow signs to               [facility] [place].  Call this number if you have problems the morning of surgery [phone]   Remember: ONLY 1 PERSON MAY GO WITH YOU TO SHORT STAY TO GET  READY MORNING OF [place].  Do not eat food or drink liquids :After Midnight.     Take these medicines the morning of surgery with A SIP OF WATER: Metoprolol, Zoloft                               You may not have any metal on your body including hair pins and              piercings  Do not wear jewelry, make-up, lotions, powders or perfumes.             Do not wear nail polish.  Do not shave  48 hours prior to surgery.              Men may shave face and neck.   Do not bring valuables to the hospital. [place].  Contacts, dentures or bridgework may not be worn into surgery.  Leave suitcase in the car. After surgery it may be brought to your room.        Special Instructions: coughing and deep breathing exercises, leg exercises              Please read over the following fact sheets you were given: _____________________________________________________________________             [facility] - Preparing for Surgery Before surgery, you can play an important role.  Because skin is not sterile, your skin needs to be as free of germs as possible.  You can reduce the number of germs on your skin by washing with CHG (chlorahexidine gluconate) soap before surgery.  CHG is an antiseptic cleaner which kills germs and bonds with the skin to continue killing germs even after washing. Please DO NOT use if you have an allergy to CHG or antibacterial soaps.  If your skin becomes reddened/irritated stop using the CHG and inform your nurse when you arrive at Short Stay. Do not shave (including legs and  underarms) for at least 48 hours prior to the first CHG shower.  You may shave your face/neck. Please follow these instructions carefully:  1.  Shower with CHG Soap the night before surgery and the  morning of Surgery.  2.  If you choose to wash your hair, wash your hair first as usual with your  normal  shampoo.  3.  After you shampoo, rinse your hair and body thoroughly to remove the  shampoo.                           4.  Use CHG as you would any other liquid soap.  You can apply chg directly  to the skin and wash  Gently with a scrungie or clean washcloth.  5.  Apply the CHG Soap to your body ONLY FROM THE NECK DOWN.   Do not use on face/ open                           Wound or open sores. Avoid contact with eyes, ears mouth and genitals (private parts).                       Wash face,  Genitals (private parts) with your normal soap.             6.  Wash thoroughly, paying special attention to the area where your surgery  will be performed.  7.  Thoroughly rinse your body with warm water from the neck down.  8.  DO NOT shower/wash with your normal soap after using and rinsing off  the CHG Soap.                9.  Pat yourself dry with a clean towel.            10.  Wear clean pajamas.            11.  Place clean sheets on your bed the night of your first shower and do not  sleep with pets. Day of Surgery : Do not apply any lotions/deodorants the morning of surgery.  Please wear clean clothes to the hospital/surgery center.  FAILURE TO FOLLOW THESE INSTRUCTIONS MAY RESULT IN THE CANCELLATION OF YOUR SURGERY PATIENT SIGNATURE_________________________________  NURSE SIGNATURE__________________________________  ________________________________________________________________________

## 2014-06-29 ENCOUNTER — Other Ambulatory Visit (HOSPITAL_COMMUNITY): Payer: Self-pay | Admitting: *Deleted

## 2014-06-29 NOTE — Progress Notes (Addendum)
06-13-14 office note cardiology pre op clearance dr Ron Parker epic ekg 12-13-13 epic Carotid duplex 12-15-13 epic holter monitor 08-28-12 epic Exercise tolerance test 08-27-12 EPIC

## 2014-07-01 ENCOUNTER — Encounter (HOSPITAL_COMMUNITY)
Admission: RE | Admit: 2014-07-01 | Discharge: 2014-07-01 | Disposition: A | Discharge status: Home / Self Care | Payer: 59 | Source: Ambulatory Visit | Attending: Orthopaedic Surgery | Admitting: Orthopaedic Surgery

## 2014-07-01 ENCOUNTER — Encounter (HOSPITAL_COMMUNITY): Payer: Self-pay

## 2014-07-01 DIAGNOSIS — Z01818 Encounter for other preprocedural examination: Secondary | ICD-10-CM | POA: Diagnosis present

## 2014-07-01 HISTORY — DX: Gastro-esophageal reflux disease without esophagitis: K21.9

## 2014-07-01 HISTORY — DX: Unspecified osteoarthritis, unspecified site: M19.90

## 2014-07-01 HISTORY — DX: Acute myocardial infarction, unspecified: I21.9

## 2014-07-01 HISTORY — DX: Cardiac murmur, unspecified: R01.1

## 2014-07-01 HISTORY — DX: Essential (primary) hypertension: I10

## 2014-07-01 LAB — BASIC METABOLIC PANEL
Anion gap: 6 (ref 5–15)
BUN: 16 mg/dL (ref 6–20)
CHLORIDE: 105 mmol/L (ref 101–111)
CO2: 27 mmol/L (ref 22–32)
Calcium: 9.4 mg/dL (ref 8.9–10.3)
Creatinine, Ser: 0.78 mg/dL (ref 0.44–1.00)
GFR calc non Af Amer: >60 mL/min (ref >60)
Glucose, Bld: 85 mg/dL (ref 65–99)
POTASSIUM: 5 mmol/L (ref 3.5–5.1)
SODIUM: 138 mmol/L (ref 135–145)

## 2014-07-01 LAB — CBC
HEMATOCRIT: 37.9 % (ref 36.0–46.0)
Hemoglobin: 12.8 g/dL (ref 12.0–15.0)
MCH: 30.9 pg (ref 26.0–34.0)
MCHC: 33.8 g/dL (ref 30.0–36.0)
MCV: 91.5 fL (ref 78.0–100.0)
PLATELETS: 265 10*3/uL (ref 150–400)
RBC: 4.14 MIL/uL (ref 3.87–5.11)
RDW: 12.6 % (ref 11.5–15.5)
WBC: 6.1 10*3/uL (ref 4.0–10.5)

## 2014-07-01 LAB — ABO/RH: ABO/RH(D): A POS

## 2014-07-01 LAB — PROTIME-INR
INR: 0.97 (ref 0.00–1.49)
PROTHROMBIN TIME: 13 s (ref 11.6–15.2)

## 2014-07-01 LAB — SURGICAL PCR SCREEN
MRSA, PCR: INVALID — AB
Staphylococcus aureus: INVALID — AB

## 2014-07-01 LAB — APTT: APTT: 31 s (ref 24–37)

## 2014-07-03 LAB — MRSA CULTURE

## 2014-07-07 NOTE — Anesthesia Preprocedure Evaluation (Addendum)
Anesthesia Evaluation  Patient identified by MRN, date of birth, ID band Patient awake    Reviewed: Allergy & Precautions, NPO status   History of Anesthesia Complications Negative for: history of anesthetic complications  Airway Mallampati: II   Neck ROM: Full    Dental  (+) Teeth Intact   Pulmonary neg pulmonary ROS,  breath sounds clear to auscultation        Cardiovascular hypertension, + CAD, + Past MI and + Peripheral Vascular Disease + Valvular Problems/Murmurs Rhythm:Regular Rate:Normal  Dr Ron Parker preop cardiac note is included , risk factors well outlined. Has aortic sclerosis   Neuro/Psych negative neurological ROS     GI/Hepatic GERD-  ,  Endo/Other    Renal/GU      Musculoskeletal  (+) Arthritis -,   Abdominal (+) + obese,   Peds  Hematology   Anesthesia Other Findings   Reproductive/Obstetrics                           Anesthesia Physical Anesthesia Plan  ASA: III  Anesthesia Plan: Spinal   Post-op Pain Management:    Induction: Intravenous  Airway Management Planned: Natural Airway and Simple Face Mask  Additional Equipment:   Intra-op Plan:   Post-operative Plan:   Informed Consent:   Plan Discussed with: CRNA and Surgeon  Anesthesia Plan Comments:        Anesthesia Quick Evaluation

## 2014-07-08 ENCOUNTER — Inpatient Hospital Stay (HOSPITAL_COMMUNITY)
Admission: RE | Admit: 2014-07-08 | Discharge: 2014-07-10 | DRG: 470 | Disposition: A | Discharge status: Home Health | Payer: 59 | Source: Ambulatory Visit | Attending: Orthopaedic Surgery | Admitting: Orthopaedic Surgery

## 2014-07-08 ENCOUNTER — Inpatient Hospital Stay (HOSPITAL_COMMUNITY): Payer: 59

## 2014-07-08 ENCOUNTER — Encounter (HOSPITAL_COMMUNITY)
Admission: RE | Disposition: A | Discharge status: Home Health | Payer: Self-pay | Source: Ambulatory Visit | Attending: Orthopaedic Surgery

## 2014-07-08 ENCOUNTER — Encounter (HOSPITAL_COMMUNITY): Payer: Self-pay | Admitting: *Deleted

## 2014-07-08 ENCOUNTER — Inpatient Hospital Stay (HOSPITAL_COMMUNITY): Payer: 59 | Admitting: Anesthesiology

## 2014-07-08 DIAGNOSIS — Z01812 Encounter for preprocedural laboratory examination: Secondary | ICD-10-CM

## 2014-07-08 DIAGNOSIS — I739 Peripheral vascular disease, unspecified: Secondary | ICD-10-CM | POA: Diagnosis present

## 2014-07-08 DIAGNOSIS — Z7982 Long term (current) use of aspirin: Secondary | ICD-10-CM

## 2014-07-08 DIAGNOSIS — I252 Old myocardial infarction: Secondary | ICD-10-CM

## 2014-07-08 DIAGNOSIS — I2510 Atherosclerotic heart disease of native coronary artery without angina pectoris: Secondary | ICD-10-CM | POA: Diagnosis present

## 2014-07-08 DIAGNOSIS — M25551 Pain in right hip: Secondary | ICD-10-CM | POA: Diagnosis present

## 2014-07-08 DIAGNOSIS — E785 Hyperlipidemia, unspecified: Secondary | ICD-10-CM | POA: Diagnosis present

## 2014-07-08 DIAGNOSIS — I10 Essential (primary) hypertension: Secondary | ICD-10-CM | POA: Diagnosis present

## 2014-07-08 DIAGNOSIS — S72009A Fracture of unspecified part of neck of unspecified femur, initial encounter for closed fracture: Secondary | ICD-10-CM

## 2014-07-08 DIAGNOSIS — M1611 Unilateral primary osteoarthritis, right hip: Secondary | ICD-10-CM | POA: Diagnosis present

## 2014-07-08 DIAGNOSIS — Z96641 Presence of right artificial hip joint: Secondary | ICD-10-CM

## 2014-07-08 HISTORY — PX: TOTAL HIP ARTHROPLASTY: SHX124

## 2014-07-08 LAB — TYPE AND SCREEN
ABO/RH(D): A POS
Antibody Screen: NEGATIVE

## 2014-07-08 SURGERY — ARTHROPLASTY, HIP, TOTAL, ANTERIOR APPROACH
Anesthesia: Spinal | Site: Hip | Laterality: Right

## 2014-07-08 MED ORDER — KETOROLAC TROMETHAMINE 15 MG/ML IJ SOLN
7.5000 mg | Freq: Once | INTRAMUSCULAR | Status: AC
  Administered 2014-07-08: 7.5 mg via INTRAVENOUS

## 2014-07-08 MED ORDER — BUPIVACAINE HCL (PF) 0.5 % IJ SOLN
INTRAMUSCULAR | Status: DC | PRN
  Administered 2014-07-08: 15 mg

## 2014-07-08 MED ORDER — LACTATED RINGERS IV BOLUS (SEPSIS)
1000.0000 mL | Freq: Once | INTRAVENOUS | Status: AC
  Administered 2014-07-08: 1000 mL via INTRAVENOUS

## 2014-07-08 MED ORDER — SODIUM CHLORIDE 0.9 % IV SOLN
10.0000 mg | INTRAVENOUS | Status: DC | PRN
  Administered 2014-07-08: 20 ug/min via INTRAVENOUS

## 2014-07-08 MED ORDER — PROPOFOL 10 MG/ML IV BOLUS
INTRAVENOUS | Status: AC
  Filled 2014-07-08: qty 20

## 2014-07-08 MED ORDER — SERTRALINE HCL 50 MG PO TABS
50.0000 mg | ORAL_TABLET | Freq: Every day | ORAL | Status: DC
  Administered 2014-07-09 – 2014-07-10 (×2): 50 mg via ORAL
  Filled 2014-07-08 (×2): qty 1

## 2014-07-08 MED ORDER — ZOLPIDEM TARTRATE 5 MG PO TABS
5.0000 mg | ORAL_TABLET | Freq: Every evening | ORAL | Status: DC | PRN
  Administered 2014-07-08: 5 mg via ORAL
  Filled 2014-07-08: qty 1

## 2014-07-08 MED ORDER — METOPROLOL TARTRATE 25 MG PO TABS
25.0000 mg | ORAL_TABLET | Freq: Every day | ORAL | Status: DC
  Administered 2014-07-09 – 2014-07-10 (×2): 25 mg via ORAL
  Filled 2014-07-08 (×2): qty 1

## 2014-07-08 MED ORDER — PHENYLEPHRINE HCL 10 MG/ML IJ SOLN
INTRAMUSCULAR | Status: DC | PRN
  Administered 2014-07-08 (×5): 80 ug via INTRAVENOUS

## 2014-07-08 MED ORDER — ROSUVASTATIN CALCIUM 40 MG PO TABS
40.0000 mg | ORAL_TABLET | Freq: Every day | ORAL | Status: DC
  Administered 2014-07-08 – 2014-07-09 (×2): 40 mg via ORAL
  Filled 2014-07-08 (×3): qty 1

## 2014-07-08 MED ORDER — OXYCODONE HCL 5 MG PO TABS
5.0000 mg | ORAL_TABLET | ORAL | Status: DC | PRN
  Administered 2014-07-08: 5 mg via ORAL
  Administered 2014-07-08: 10 mg via ORAL
  Administered 2014-07-08: 15 mg via ORAL
  Administered 2014-07-08: 10 mg via ORAL
  Administered 2014-07-08: 5 mg via ORAL
  Administered 2014-07-09 – 2014-07-10 (×9): 15 mg via ORAL
  Filled 2014-07-08 (×6): qty 3
  Filled 2014-07-08 (×2): qty 1
  Filled 2014-07-08 (×2): qty 3
  Filled 2014-07-08: qty 2
  Filled 2014-07-08: qty 3
  Filled 2014-07-08: qty 2
  Filled 2014-07-08 (×2): qty 3

## 2014-07-08 MED ORDER — ASPIRIN EC 325 MG PO TBEC
325.0000 mg | DELAYED_RELEASE_TABLET | Freq: Two times a day (BID) | ORAL | Status: DC
  Administered 2014-07-09 – 2014-07-10 (×3): 325 mg via ORAL
  Filled 2014-07-08 (×5): qty 1

## 2014-07-08 MED ORDER — ALBUMIN HUMAN 5 % IV SOLN
12.5000 g | Freq: Once | INTRAVENOUS | Status: AC
  Administered 2014-07-08: 12.5 g via INTRAVENOUS

## 2014-07-08 MED ORDER — SODIUM CHLORIDE 0.9 % IV SOLN
INTRAVENOUS | Status: DC
  Administered 2014-07-08 – 2014-07-09 (×2): via INTRAVENOUS

## 2014-07-08 MED ORDER — ACETAMINOPHEN 325 MG PO TABS
650.0000 mg | ORAL_TABLET | Freq: Four times a day (QID) | ORAL | Status: DC | PRN
Start: 2014-07-08 — End: 2014-07-10
  Administered 2014-07-10: 650 mg via ORAL
  Filled 2014-07-08: qty 2

## 2014-07-08 MED ORDER — NITROGLYCERIN 0.4 MG SL SUBL: 0.4000 mg | SUBLINGUAL_TABLET | SUBLINGUAL | Status: DC | PRN

## 2014-07-08 MED ORDER — METOCLOPRAMIDE HCL 5 MG/ML IJ SOLN: 5.0000 mg | Freq: Three times a day (TID) | INTRAMUSCULAR | Status: DC | PRN

## 2014-07-08 MED ORDER — MENTHOL 3 MG MT LOZG: 1.0000 | LOZENGE | OROMUCOSAL | Status: DC | PRN

## 2014-07-08 MED ORDER — LACTATED RINGERS IV SOLN
INTRAVENOUS | Status: DC | PRN
  Administered 2014-07-08 (×2): via INTRAVENOUS

## 2014-07-08 MED ORDER — PHENOL 1.4 % MT LIQD: 1.0000 | OROMUCOSAL | Status: DC | PRN

## 2014-07-08 MED ORDER — MIDAZOLAM HCL 2 MG/2ML IJ SOLN
INTRAMUSCULAR | Status: AC
  Filled 2014-07-08: qty 2

## 2014-07-08 MED ORDER — ONDANSETRON HCL 4 MG/2ML IJ SOLN: 4.0000 mg | Freq: Four times a day (QID) | INTRAMUSCULAR | Status: DC | PRN

## 2014-07-08 MED ORDER — KETOROLAC TROMETHAMINE 15 MG/ML IJ SOLN
INTRAMUSCULAR | Status: AC
  Filled 2014-07-08: qty 1

## 2014-07-08 MED ORDER — PROPOFOL INFUSION 10 MG/ML OPTIME
INTRAVENOUS | Status: DC | PRN
  Administered 2014-07-08: 70 ug/kg/min via INTRAVENOUS

## 2014-07-08 MED ORDER — CLINDAMYCIN PHOSPHATE 600 MG/50ML IV SOLN
600.0000 mg | Freq: Four times a day (QID) | INTRAVENOUS | Status: AC
Start: 2014-07-08 — End: 2014-07-08
  Administered 2014-07-08 (×2): 600 mg via INTRAVENOUS
  Filled 2014-07-08 (×2): qty 50

## 2014-07-08 MED ORDER — DIPHENHYDRAMINE HCL 12.5 MG/5ML PO ELIX
12.5000 mg | ORAL_SOLUTION | ORAL | Status: DC | PRN
  Administered 2014-07-09: 25 mg via ORAL
  Administered 2014-07-09: 12.5 mg via ORAL
  Administered 2014-07-09 (×2): 25 mg via ORAL
  Filled 2014-07-08: qty 5
  Filled 2014-07-08 (×3): qty 10

## 2014-07-08 MED ORDER — ACETAMINOPHEN 650 MG RE SUPP: 650.0000 mg | Freq: Four times a day (QID) | RECTAL | Status: DC | PRN

## 2014-07-08 MED ORDER — ALBUMIN HUMAN 5 % IV SOLN
INTRAVENOUS | Status: AC
  Filled 2014-07-08: qty 250

## 2014-07-08 MED ORDER — FAMOTIDINE 10 MG PO TABS
10.0000 mg | ORAL_TABLET | ORAL | Status: DC | PRN
  Filled 2014-07-08: qty 1

## 2014-07-08 MED ORDER — HYDROMORPHONE HCL 1 MG/ML IJ SOLN
1.0000 mg | INTRAMUSCULAR | Status: DC | PRN
  Administered 2014-07-09: 1 mg via INTRAVENOUS
  Filled 2014-07-08: qty 1

## 2014-07-08 MED ORDER — DOCUSATE SODIUM 100 MG PO CAPS
100.0000 mg | ORAL_CAPSULE | Freq: Two times a day (BID) | ORAL | Status: DC
  Administered 2014-07-08 – 2014-07-10 (×4): 100 mg via ORAL

## 2014-07-08 MED ORDER — CLINDAMYCIN PHOSPHATE 900 MG/50ML IV SOLN
INTRAVENOUS | Status: AC
  Filled 2014-07-08: qty 50

## 2014-07-08 MED ORDER — ONDANSETRON HCL 4 MG PO TABS: 4.0000 mg | ORAL_TABLET | Freq: Four times a day (QID) | ORAL | Status: DC | PRN

## 2014-07-08 MED ORDER — MIDAZOLAM HCL 5 MG/5ML IJ SOLN
INTRAMUSCULAR | Status: DC | PRN
  Administered 2014-07-08: 2 mg via INTRAVENOUS

## 2014-07-08 MED ORDER — ONDANSETRON HCL 4 MG/2ML IJ SOLN
INTRAMUSCULAR | Status: DC | PRN
  Administered 2014-07-08: 4 mg via INTRAVENOUS

## 2014-07-08 MED ORDER — PHENYLEPHRINE 40 MCG/ML (10ML) SYRINGE FOR IV PUSH (FOR BLOOD PRESSURE SUPPORT)
PREFILLED_SYRINGE | INTRAVENOUS | Status: AC
  Filled 2014-07-08: qty 10

## 2014-07-08 MED ORDER — CLINDAMYCIN PHOSPHATE 900 MG/50ML IV SOLN
900.0000 mg | INTRAVENOUS | Status: AC
  Administered 2014-07-08: 900 mg via INTRAVENOUS

## 2014-07-08 MED ORDER — METHOCARBAMOL 500 MG PO TABS
500.0000 mg | ORAL_TABLET | Freq: Four times a day (QID) | ORAL | Status: DC | PRN
  Administered 2014-07-08 – 2014-07-10 (×7): 500 mg via ORAL
  Filled 2014-07-08 (×9): qty 1

## 2014-07-08 MED ORDER — METHOCARBAMOL 1000 MG/10ML IJ SOLN
500.0000 mg | Freq: Four times a day (QID) | INTRAVENOUS | Status: DC | PRN
  Administered 2014-07-08: 500 mg via INTRAVENOUS
  Filled 2014-07-08 (×2): qty 5

## 2014-07-08 MED ORDER — PHENYLEPHRINE HCL 10 MG/ML IJ SOLN
INTRAMUSCULAR | Status: AC
  Filled 2014-07-08: qty 1

## 2014-07-08 MED ORDER — ALUM & MAG HYDROXIDE-SIMETH 200-200-20 MG/5ML PO SUSP: 30.0000 mL | ORAL | Status: DC | PRN

## 2014-07-08 MED ORDER — FENTANYL CITRATE (PF) 100 MCG/2ML IJ SOLN
INTRAMUSCULAR | Status: AC
  Filled 2014-07-08: qty 2

## 2014-07-08 MED ORDER — FENTANYL CITRATE (PF) 100 MCG/2ML IJ SOLN
INTRAMUSCULAR | Status: DC | PRN
  Administered 2014-07-08: 100 ug via INTRAVENOUS

## 2014-07-08 MED ORDER — METOCLOPRAMIDE HCL 10 MG PO TABS: 5.0000 mg | ORAL_TABLET | Freq: Three times a day (TID) | ORAL | Status: DC | PRN

## 2014-07-08 MED ORDER — HYDROMORPHONE HCL 1 MG/ML IJ SOLN
1.0000 mg | INTRAMUSCULAR | Status: DC | PRN
Start: 2014-07-08 — End: 2014-07-08
  Administered 2014-07-08 (×2): 1 mg via INTRAVENOUS
  Filled 2014-07-08 (×2): qty 1

## 2014-07-08 MED ORDER — DEXAMETHASONE SODIUM PHOSPHATE 10 MG/ML IJ SOLN
INTRAMUSCULAR | Status: DC | PRN
  Administered 2014-07-08: 10 mg via INTRAVENOUS

## 2014-07-08 SURGICAL SUPPLY — 39 items
BAG ZIPLOCK 12X15 (MISCELLANEOUS) IMPLANT
BENZOIN TINCTURE PRP APPL 2/3 (GAUZE/BANDAGES/DRESSINGS) ×4 IMPLANT
BLADE SAW SGTL 18X1.27X75 (BLADE) ×2 IMPLANT
CAPT HIP TOTAL 2 ×2 IMPLANT
CELLS DAT CNTRL 66122 CELL SVR (MISCELLANEOUS) ×1 IMPLANT
COVER PERINEAL POST (MISCELLANEOUS) ×2 IMPLANT
DRAPE C-ARM 42X120 X-RAY (DRAPES) ×2 IMPLANT
DRAPE STERI IOBAN 125X83 (DRAPES) ×2 IMPLANT
DRAPE U-SHAPE 47X51 STRL (DRAPES) ×6 IMPLANT
DRSG AQUACEL AG ADV 3.5X10 (GAUZE/BANDAGES/DRESSINGS) ×2 IMPLANT
DURAPREP 26ML APPLICATOR (WOUND CARE) ×2 IMPLANT
ELECT BLADE TIP CTD 4 INCH (ELECTRODE) ×2 IMPLANT
ELECT REM PT RETURN 9FT ADLT (ELECTROSURGICAL) ×2
ELECTRODE REM PT RTRN 9FT ADLT (ELECTROSURGICAL) ×1 IMPLANT
FACESHIELD WRAPAROUND (MASK) ×8 IMPLANT
GAUZE XEROFORM 1X8 LF (GAUZE/BANDAGES/DRESSINGS) ×2 IMPLANT
GLOVE BIO SURGEON STRL SZ7.5 (GLOVE) ×2 IMPLANT
GLOVE BIOGEL PI IND STRL 8 (GLOVE) ×2 IMPLANT
GLOVE BIOGEL PI INDICATOR 8 (GLOVE) ×2
GLOVE ECLIPSE 8.0 STRL XLNG CF (GLOVE) ×2 IMPLANT
GOWN STRL REUS W/TWL XL LVL3 (GOWN DISPOSABLE) ×4 IMPLANT
HANDPIECE INTERPULSE COAX TIP (DISPOSABLE) ×1
KIT BASIN OR (CUSTOM PROCEDURE TRAY) ×2 IMPLANT
PACK TOTAL JOINT (CUSTOM PROCEDURE TRAY) ×2 IMPLANT
PEN SKIN MARKING BROAD (MISCELLANEOUS) ×2 IMPLANT
RTRCTR WOUND ALEXIS 18CM MED (MISCELLANEOUS) ×2
SET HNDPC FAN SPRY TIP SCT (DISPOSABLE) ×1 IMPLANT
STAPLER VISISTAT 35W (STAPLE) IMPLANT
STRIP CLOSURE SKIN 1/2X4 (GAUZE/BANDAGES/DRESSINGS) ×4 IMPLANT
SUT ETHIBOND NAB CT1 #1 30IN (SUTURE) ×2 IMPLANT
SUT MNCRL AB 4-0 PS2 18 (SUTURE) ×2 IMPLANT
SUT VIC AB 0 CT1 36 (SUTURE) ×2 IMPLANT
SUT VIC AB 1 CT1 36 (SUTURE) ×2 IMPLANT
SUT VIC AB 2-0 CT1 27 (SUTURE) ×2
SUT VIC AB 2-0 CT1 TAPERPNT 27 (SUTURE) ×2 IMPLANT
TOWEL OR 17X26 10 PK STRL BLUE (TOWEL DISPOSABLE) ×2 IMPLANT
TOWEL OR NON WOVEN STRL DISP B (DISPOSABLE) ×2 IMPLANT
TRAY FOLEY W/METER SILVER 14FR (SET/KITS/TRAYS/PACK) ×2 IMPLANT
YANKAUER SUCT BULB TIP 10FT TU (MISCELLANEOUS) ×2 IMPLANT

## 2014-07-08 NOTE — Brief Op Note (Signed)
07/08/2014  8:38 AM  PATIENT:  Courtney [name]  64 y.o. female  PRE-OPERATIVE DIAGNOSIS:  Primary osteoarthritis right hip  POST-OPERATIVE DIAGNOSIS:  Primary osteoarthritis right hip  PROCEDURE:  Procedure(s): RIGHT TOTAL HIP ARTHROPLASTY ANTERIOR APPROACH (Right)  SURGEON:  Surgeon(s) and Role:    * Mcarthur Rossetti, MD - Primary  PHYSICIAN ASSISTANT: Benita Stabile, PA-C  ANESTHESIA:   spinal  EBL:  Total I/O In: 1000 [I.V.:1000] Out: 200 [Blood:200]  BLOOD ADMINISTERED:none  DRAINS: none   LOCAL MEDICATIONS USED:  NONE  SPECIMEN:  No Specimen  DISPOSITION OF SPECIMEN:  N/A  COUNTS:  YES  TOURNIQUET:  * No tourniquets in log *  DICTATION: .Other Dictation: Dictation Number [id number]  PLAN OF CARE: Admit to inpatient   PATIENT DISPOSITION:  PACU - hemodynamically stable.   Delay start of Pharmacological VTE agent (>24hrs) due to surgical blood loss or risk of bleeding: no

## 2014-07-08 NOTE — Transfer of Care (Signed)
Immediate Anesthesia Transfer of Care Note  Patient: Courtney [name]  Procedure(s) Performed: Procedure(s): RIGHT TOTAL HIP ARTHROPLASTY ANTERIOR APPROACH (Right)  Patient Location: PACU  Anesthesia Type:Spinal  Level of Consciousness: sedated  Airway & Oxygen Therapy: Patient Spontanous Breathing and Patient connected to face mask oxygen  Post-op Assessment: Report given to RN and Post -op Vital signs reviewed and stable  Post vital signs: Reviewed and stable  Last Vitals:  Filed Vitals:   07/08/14 0537  BP: 152/73  Pulse: 64  Temp: 36.9 C  Resp: 18    Complications: No apparent anesthesia complications

## 2014-07-08 NOTE — Anesthesia Procedure Notes (Signed)
Spinal Patient location during procedure: OR Start time: 07/08/2014 7:23 AM End time: 07/08/2014 7:25 AM Staffing Resident/CRNA: Harle Stanford R Performed by: resident/CRNA  Preanesthetic Checklist Completed: patient identified, site marked, surgical consent, pre-op evaluation, timeout performed, IV checked, risks and benefits discussed and monitors and equipment checked Spinal Block Patient position: sitting Prep: Betadine Patient monitoring: heart rate, cardiac monitor, blood pressure and continuous pulse ox Approach: midline Location: L3-4 Injection technique: single-shot Needle Needle type: Sprotte  Needle gauge: 24 G Needle length: 9 cm Needle insertion depth: 7 cm Assessment Sensory level: T6 Additional Notes Patient tolerated procedure well. No difficulties.

## 2014-07-08 NOTE — H&P (Signed)
TOTAL HIP ADMISSION H&P  Patient is admitted for right total hip arthroplasty.  Subjective:  Chief Complaint: right hip pain  HPI: Courtney [name], 64 y.o. female, has a history of pain and functional disability in the right hip(s) due to arthritis and patient has failed non-surgical conservative treatments for greater than 12 weeks to include NSAID's and/or analgesics, corticosteriod injections, flexibility and strengthening excercises, weight reduction as appropriate and activity modification.  Onset of symptoms was gradual starting 2 years ago with rapidlly worsening course since that time.The patient noted no past surgery on the right hip(s).  Patient currently rates pain in the right hip at 10 out of 10 with activity. Patient has night pain, worsening of pain with activity and weight bearing, pain that interfers with activities of daily living and pain with passive range of motion. Patient has evidence of subchondral sclerosis, periarticular osteophytes and joint space narrowing by imaging studies. This condition presents safety issues increasing the risk of falls.  There is no current active infection.  Patient Active Problem List   Diagnosis Date Noted  . Osteoarthritis of right hip 07/08/2014  . Pre-operative cardiovascular examination 06/13/2014  . Thoracic back pain 04/07/2014  . Neck pain 03/17/2014  . Nonallopathic lesion of cervical region 03/17/2014  . Nonallopathic lesion of thoracic region 03/17/2014  . Nonallopathic lesion of sacral region 03/17/2014  . Hip pain 12/13/2013  . Palpitations 08/27/2012  . Fatigue 08/27/2012  . Sinus bradycardia 08/27/2012  . Depression   . Carotid artery disease   . PVC (premature ventricular contraction)   . Aortic valve sclerosis   . Hyperlipidemia   . Coronary artery disease   . CAD (coronary artery disease)   . Ejection fraction   . Fluid overload    Past Medical History  Diagnosis Date  . Carotid artery disease     40-59%  bilateral, doppler December 2010  . Shellfish allergy   . PVC (premature ventricular contraction)     per prior Holter monitor  . Aortic valve sclerosis     echo 61/6073, soft systolic murmur  . Dyslipidemia   . Coronary artery disease     a. BMS-mid RCA 11/2007 b. stress echo 02/2009 subtle inferior HK, lateral ST depressions with stress c. follow-up cath 02/2009: RCA stent patent, severe but stable stenosis of distal posterior lateral branch RCA. LV normal, med tx unless more sx c. ETT Myoview 08/27/12: negative for ischemia; EF 64%  . Right hip pain 12/2009  . Ejection fraction     EF normal, catheterization, 2011 /  EF 55%, echo, 2010  . Fluid overload     Mild, stable since hospitalization in the past  . Depression     Mild, August, 2012  . Myocardial infarction     2009  . Hypertension   . Heart murmur     has, occasional PVC's  . GERD (gastroesophageal reflux disease)     occarionally, takes Pepcid over the counter  . Arthritis     in right hip    Past Surgical History  Procedure Laterality Date  . Coronary angioplasty with stent placement  11/2007    BMS-mid RCA  . Cardiac catheterization  1/211    Patent RCA stent, stenotic PLB supplying small distribution; medically managed  . Diagnostic laparoscopy      1982 for endometriosis  . Hx of wisdom teeth surgery    . Dilation and curettage of uterus  2005    Prescriptions prior to admission  Medication  Sig Dispense Refill Last Dose  . acetaminophen (TYLENOL) 500 MG tablet Take 1,000 mg by mouth every 6 (six) hours as needed (Pain).    07/06/2014 at pm  . aspirin EC 81 MG tablet Take 81 mg by mouth daily.   07/07/2014 at 0700  . Diclofenac Sodium 2 % SOLN Apply 1 pump twice daily. (Patient taking differently: Apply 1 application topically 2 (two) times daily as needed. Apply 1 pump twice daily.) 112 g 3 Past Week at Unknown time  . metoprolol tartrate (LOPRESSOR) 25 MG tablet Take 25 mg by mouth daily.   07/08/2014 at 0430  .  ramipril (ALTACE) 5 MG capsule TAKE 1 CAPSULE BY MOUTH ONCE DAILY 90 capsule 3 07/07/2014 at 0700  . rosuvastatin (CRESTOR) 40 MG tablet Take 1 tablet (40 mg total) by mouth daily. 90 tablet 3 07/06/2014 at hs  . sertraline (ZOLOFT) 50 MG tablet Take 50 mg by mouth daily.     07/08/2014 at 0430  . Vitamin D, Ergocalciferol, (DRISDOL) 50000 UNITS CAPS capsule Take 50,000 Units by mouth every 7 (seven) days.   Past Week at Unknown time  . famotidine (PEPCID AC) 10 MG chewable tablet Chew 10 mg by mouth as needed for heartburn.   Unknown at Unknown time  . metoprolol tartrate (LOPRESSOR) 25 MG tablet TAKE 1/2 TABLET BY MOUTH TWICE A DAY (Patient not taking: Reported on 06/28/2014) 90 tablet 3   . nitroGLYCERIN (NITROSTAT) 0.4 MG SL tablet Place 0.4 mg under the tongue every 5 (five) minutes as needed for chest pain.   Unknown at Unknown time  . oxyCODONE-acetaminophen (PERCOCET/ROXICET) 5-325 MG per tablet Take 1 tablet by mouth daily as needed for severe pain (Pain).   Unknown at Unknown time   Allergies  Allergen Reactions  . Erythromycin Hives  . Penicillins Hives    History  Substance Use Topics  . Smoking status: Never Smoker   . Smokeless tobacco: Never Used  . Alcohol Use: Yes     Comment: 1 glass of wine/night    Family History  Problem Relation Age of Onset  . Heart attack Father 68     Review of Systems  Musculoskeletal: Positive for joint pain.  All other systems reviewed and are negative.   Objective:  Physical Exam  Constitutional: She is oriented to person, place, and time. She appears well-developed and well-nourished.  HENT:  Head: Normocephalic and atraumatic.  Eyes: EOM are normal. Pupils are equal, round, and reactive to light.  Neck: Normal range of motion. Neck supple.  Cardiovascular: Normal rate and regular rhythm.   Respiratory: Effort normal and breath sounds normal.  GI: Soft. Bowel sounds are normal.  Musculoskeletal:       Right hip: She exhibits  decreased range of motion, decreased strength, tenderness and bony tenderness.  Neurological: She is alert and oriented to person, place, and time.  Skin: Skin is warm and dry.  Psychiatric: She has a normal mood and affect.    Vital signs in last 24 hours: Temp:  [98.4 F (36.9 C)] 98.4 F (36.9 C) (05/20 0537) Pulse Rate:  [64] 64 (05/20 0537) Resp:  [18] 18 (05/20 0537) BP: (152)/(73) 152/73 mmHg (05/20 0537) SpO2:  [98 %] 98 % (05/20 0537) Weight:  [82.101 kg (181 lb)] 82.101 kg (181 lb) (05/20 0540)  Labs:   Estimated body mass index is 35.35 kg/(m^2) as calculated from the following:   Height as of this encounter: 5' (1.524 m).   Weight as of  this encounter: 82.101 kg (181 lb).   Imaging Review Plain radiographs demonstrate severe degenerative joint disease of the right hip(s). The bone quality appears to be good for age and reported activity level.  Assessment/Plan:  End stage arthritis, right hip(s)  The patient history, physical examination, clinical judgement of the provider and imaging studies are consistent with end stage degenerative joint disease of the right hip(s) and total hip arthroplasty is deemed medically necessary. The treatment options including medical management, injection therapy, arthroscopy and arthroplasty were discussed at length. The risks and benefits of total hip arthroplasty were presented and reviewed. The risks due to aseptic loosening, infection, stiffness, dislocation/subluxation,  thromboembolic complications and other imponderables were discussed.  The patient acknowledged the explanation, agreed to proceed with the plan and consent was signed. Patient is being admitted for inpatient treatment for surgery, pain control, PT, OT, prophylactic antibiotics, VTE prophylaxis, progressive ambulation and ADL's and discharge planning.The patient is planning to be discharged home with home health services

## 2014-07-08 NOTE — Anesthesia Postprocedure Evaluation (Signed)
  Anesthesia Post-op Note  Patient: Courtney [name]  Procedure(s) Performed: Procedure(s): RIGHT TOTAL HIP ARTHROPLASTY ANTERIOR APPROACH (Right)  Patient Location: PACU  Anesthesia Type:Spinal  Level of Consciousness: awake and alert   Airway and Oxygen Therapy: Patient Spontanous Breathing  Post-op Pain: none  Post-op Assessment: Post-op Vital signs reviewed  Post-op Vital Signs: stable  Last Vitals:  Filed Vitals:   07/08/14 1000  BP: 108/44  Pulse: 56  Temp:   Resp: 17    Complications: No apparent anesthesia complications

## 2014-07-08 NOTE — Evaluation (Signed)
Physical Therapy Evaluation Patient Details Name: Courtney [name] MRN: [id number] DOB: 10-18-1950 Today's Date: 07/08/2014   History of Present Illness  R THR  Clinical Impression  Pt s/p R THR presents with decreased R LE strength/ROM and post op pain limiting functional mobility.  Pt should progress well to dc home with family assist and HHPT follow up.    Follow Up Recommendations Home health PT    Equipment Recommendations  Rolling walker with 5" wheels    Recommendations for Other Services OT consult     Precautions / Restrictions Precautions Precautions: Fall Restrictions Weight Bearing Restrictions: No Other Position/Activity Restrictions: WBAT      Mobility  Bed Mobility Overal bed mobility: Needs Assistance Bed Mobility: Supine to Sit     Supine to sit: Min assist        Transfers Overall transfer level: Needs assistance Equipment used: Rolling walker (2 wheeled) Transfers: Sit to/from Stand Sit to Stand: Min assist         General transfer comment: cues for LE management and use of UEs to self assist  Ambulation/Gait Ambulation/Gait assistance: Min assist Ambulation Distance (Feet): 44 Feet Assistive device: Rolling walker (2 wheeled) Gait Pattern/deviations: Step-to pattern;Decreased step length - right;Decreased step length - left;Shuffle;Trunk flexed Gait velocity: decr   General Gait Details: cues for posture, position from RW and sequence  Stairs            Wheelchair Mobility    Modified Rankin (Stroke Patients Only)       Balance                                             Pertinent Vitals/Pain Pain Assessment: 0-10 Pain Score: 8  Pain Location: R hip and thigh - but has left the groin Pain Descriptors / Indicators: Aching;Burning;Tightness Pain Intervention(s): Limited activity within patient's tolerance;Monitored during session;Premedicated before session;Ice applied    Home Living Family/patient  expects to be discharged to:: Private residence Living Arrangements: Spouse/significant other Available Help at Discharge: Family Type of Home: House Home Access: Stairs to enter Entrance Stairs-Rails: Left Entrance Stairs-Number of Steps: 1+2 Home Layout: One level Home Equipment: Cane - single point;Shower seat;Walker - 4 wheels      Prior Function Level of Independence: Independent               Hand Dominance        Extremity/Trunk Assessment   Upper Extremity Assessment: Overall WFL for tasks assessed           Lower Extremity Assessment: RLE deficits/detail RLE Deficits / Details: 3-/5 quads    Cervical / Trunk Assessment: Normal  Communication   Communication: No difficulties  Cognition Arousal/Alertness: Awake/alert Behavior During Therapy: WFL for tasks assessed/performed Overall Cognitive Status: Within Functional Limits for tasks assessed                      General Comments      Exercises Total Joint Exercises Ankle Circles/Pumps: AROM;Both;10 reps;Supine Long Arc Quad: AAROM;Right;5 reps;Seated      Assessment/Plan    PT Assessment Patient needs continued PT services  PT Diagnosis Difficulty walking   PT Problem List Decreased strength;Decreased range of motion;Decreased activity tolerance;Decreased mobility;Decreased knowledge of use of DME;Pain  PT Treatment Interventions DME instruction;Gait training;Stair training;Functional mobility training;Therapeutic activities;Therapeutic exercise;Patient/family education   PT Goals (Current goals  can be found in the Care Plan section) Acute Rehab PT Goals Patient Stated Goal: Walk without pain PT Goal Formulation: With patient Time For Goal Achievement: 07/13/14 Potential to Achieve Goals: Good    Frequency 7X/week   Barriers to discharge        Co-evaluation               End of Session Equipment Utilized During Treatment: Gait belt Activity Tolerance: Patient  tolerated treatment well Patient left: in chair;with call bell/phone within reach;with family/visitor present Nurse Communication: Mobility status         Time: 6378-5885 PT Time Calculation (min) (ACUTE ONLY): 28 min   Charges:   PT Evaluation $Initial PT Evaluation Tier I: 1 Procedure PT Treatments $Gait Training: 8-22 mins   PT G Codes:        Haskell Rihn 2014-08-01, 5:34 PM

## 2014-07-08 NOTE — Op Note (Signed)
Courtney [name], BONSER NO.:  [id number]  MEDICAL RECORD NO.:  [id number]  LOCATION:  54                         FACILITY:  [facility]  PHYSICIAN:  Lind Guest. Ninfa Linden, M.D.DATE OF BIRTH:  1950/09/02  DATE OF PROCEDURE:  07/08/2014 DATE OF DISCHARGE:                              OPERATIVE REPORT   PREOPERATIVE DIAGNOSES:  Primary osteoarthritis and degenerative joint disease, right hip.  POSTOPERATIVE DIAGNOSES:  Primary osteoarthritis and degenerative joint disease, right hip.  PROCEDURE:  Right total hip arthroplasty through direct anterior approach.  IMPLANTS:  DePuy Sector Gription acetabular component size 48 with 1 screw an apex hole eliminator, size 32+ 0 neutral polyethylene liner, size 9 Corail femoral component with standard offset, size 32+ 1 ceramic hip ball.  SURGEON:  Lind Guest. Ninfa Linden, [name]  ASSISTANT:  Erskine Emery, PA-C  ANESTHESIA:  Spinal.  BLOOD LOSS:  200 mL.  ANTIBIOTICS:  2 g of IV Ancef.  COMPLICATIONS:  None.  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  She has tried and failed all forms of conservative treatment and is now affecting her quality of life.  Her pain is daily in her mobility and activities of daily living has become limited.  She does wish to proceed with a right total hip arthroplasty through direct anterior approach.  I explained to her in detail the risks of acute blood loss anemia, nerve and vessel injury, fracture, infection, dislocation, and DVT.  She understands the goals are decreased pain, improved mobility, and overall improved quality of life.  DESCRIPTION OF PROCEDURE:  After informed consent was obtained, appropriate right hip was marked.  She was brought to the operating room.  Spinal anesthesia was obtained while she was on her stretcher. She was then laid in a supine position on  the stretcher.  Foley catheter was placed and both feet had traction boots applied to them.  Next, she was placed supine on the HANA fracture table with the perineal post in place and both legs in inline skeletal traction devices, but no traction applied.  Her right operative hip was then prepped and draped with DuraPrep and sterile drapes.  Time-out was called and she was identified as correct patient, correct right hip.  We then made an incision inferior and posterior to the anterior-superior iliac spine and carried this obliquely down the leg.  I dissected down the tensor fascia lata muscle and tensor fascia was then divided longitudinally so that we could proceed with a direct anterior approach to the hip.  We identified and cauterized the lateral femoral circumflex vessels and then identified the hip capsule.  We placed Cobra retractor around the lateral neck and up underneath the rectus femoris on the medial femoral neck.  I then opened up the hip capsule in an L-type format finding a large joint effusion and periarticular osteophytes.  We placed the Cobra retractor within the hip capsule and made our femoral neck cut with an oscillating saw just proximal to the lesser trochanter.  I completed this with an osteotome.  I placed a corkscrew guide in  the femoral head and removed the femoral head in its entirety and found it to be devoid of cartilage.  I then placed a bent Hohmann over the medial acetabular rim and cleaned the acetabular debris including remnants of acetabular labrum.  I then began reaming under direct visualization from a size 42 reamer in 2 mm increments up to a size 48 with all reamers again under direct visualization with the last reamer under direct fluoroscopy so I could obtain my depth of reaming, my inclination, and anteversion.  Once I was pleased with this, I placed the real DePuy Sector Gription acetabular component size 48, the apex hole eliminator, and a  single 25 length screw.  I then placed the real 32+ 0 neutral polyethylene liner for a size 48 acetabular component.  Attention was then turned to the femur.  With the leg externally to 100 degrees, extended and adducted, I was able to place a Mueller retractor medially and a Hohmann retractor behind the greater trochanter.  I released the lateral joint capsule and then used a box cutting osteotome to enter the femoral canal and a rongeur to lateralize.  I then began broaching from a size 8 broach and using Corail broaching system and I had to go up to a size 9 to narrow canal.  With the size 9 in place, I trialed a standard neck and a 32+ 1 hip ball.  We brought the leg back over and up and with traction and internal rotation reducing the pelvis, I was pleased with her offset and leg lengths under fluoroscopy and then was able to rotate her past 90 degrees of external rotation and past 50 degrees of internal rotation, it was stable with simulated walk that would bring her leg down and rotating it, and it still remained stable.  There was also minimal shuck.  We then dislocated the hip and removed the trial components and placed the real Corail femoral component from DePuy size 9 with standard offset, the real 32+ 1 hip ball, and again reduced this until the acetabulum was stable.  We then copiously irrigated the soft tissues with normal saline solution using pulsatile lavage.  We closed the joint capsule with interrupted #1 Ethibond suture followed by running #1 Vicryl in the tensor fascia, 0-Vicryl in the deep tissue, 2-0 Vicryl in the subcutaneous tissue, 4-0 Monocryl subcuticular stitch, and Steri- Strips on the skin.  An Aquacel dressing was applied.  She was taken off the HANA table, taken to the recovery room in stable condition.  All final counts were correct, and there were no complications noted.  Of note, Erskine Emery, PA-C assisted during the entire case.  His assistance was  crucial in facilitating all aspects of this case and was medically necessary.     Lind Guest. Ninfa Linden, [name]     CYB/MEDQ  D:  07/08/2014  T:  07/08/2014  Job:  [id number]

## 2014-07-08 NOTE — Plan of Care (Signed)
Problem: Consults Goal: Diagnosis- Total Joint Replacement Right anterior hip     

## 2014-07-09 LAB — BASIC METABOLIC PANEL
ANION GAP: 7 (ref 5–15)
BUN: 14 mg/dL (ref 6–20)
CALCIUM: 8.8 mg/dL — AB (ref 8.9–10.3)
CHLORIDE: 105 mmol/L (ref 101–111)
CO2: 25 mmol/L (ref 22–32)
CREATININE: 0.8 mg/dL (ref 0.44–1.00)
GFR calc Af Amer: >60 mL/min (ref >60)
GFR calc non Af Amer: >60 mL/min (ref >60)
GLUCOSE: 138 mg/dL — AB (ref 65–99)
Potassium: 4.6 mmol/L (ref 3.5–5.1)
Sodium: 137 mmol/L (ref 135–145)

## 2014-07-09 LAB — CBC
HCT: 29 % — ABNORMAL LOW (ref 36.0–46.0)
HEMOGLOBIN: 9.5 g/dL — AB (ref 12.0–15.0)
MCH: 29.9 pg (ref 26.0–34.0)
MCHC: 32.8 g/dL (ref 30.0–36.0)
MCV: 91.2 fL (ref 78.0–100.0)
Platelets: 173 10*3/uL (ref 150–400)
RBC: 3.18 MIL/uL — AB (ref 3.87–5.11)
RDW: 12.3 % (ref 11.5–15.5)
WBC: 10.8 10*3/uL — ABNORMAL HIGH (ref 4.0–10.5)

## 2014-07-09 MED ORDER — METHOCARBAMOL 500 MG PO TABS: 500.0000 mg | ORAL_TABLET | Freq: Four times a day (QID) | ORAL | Status: DC | PRN

## 2014-07-09 MED ORDER — ASPIRIN 325 MG PO TBEC: 325.0000 mg | DELAYED_RELEASE_TABLET | Freq: Two times a day (BID) | ORAL | Status: DC

## 2014-07-09 MED ORDER — OXYCODONE HCL 5 MG PO TABS: 5.0000 mg | ORAL_TABLET | ORAL | Status: DC | PRN

## 2014-07-09 NOTE — Progress Notes (Signed)
Physical Therapy Treatment Patient Details Name: Courtney [name] MRN: [id number] DOB: 1950/06/25 Today's Date: 07/09/2014    History of Present Illness R THR    PT Comments    Progressing well with mobility and hopeful for dc tomorrow  Follow Up Recommendations  Home health PT     Equipment Recommendations  Rolling walker with 5" wheels    Recommendations for Other Services OT consult     Precautions / Restrictions Precautions Precautions: Fall Restrictions Weight Bearing Restrictions: No Other Position/Activity Restrictions: WBAT    Mobility  Bed Mobility Overal bed mobility: Needs Assistance Bed Mobility: Supine to Sit;Sit to Supine     Supine to sit: Min guard Sit to supine: Min assist   General bed mobility comments: cues for sequence and use of L LE to self assist  Transfers Overall transfer level: Needs assistance Equipment used: Rolling walker (2 wheeled) Transfers: Sit to/from Stand Sit to Stand: Supervision         General transfer comment: cues for LE management and use of UEs to self assist  Ambulation/Gait Ambulation/Gait assistance: Min guard Ambulation Distance (Feet): 123 Feet Assistive device: Rolling walker (2 wheeled) Gait Pattern/deviations: Step-to pattern;Step-through pattern;Decreased step length - right;Decreased step length - left;Shuffle;Trunk flexed Gait velocity: decr   General Gait Details: cues for posture, position from RW and sequence   Stairs Stairs: Yes Stairs assistance: Min assist Stair Management: No rails;Two rails;Step to pattern;Forwards;Backwards;With walker Number of Stairs: 4 General stair comments: 2 step fwd with bil rails and 2 step bkwd with RW  Wheelchair Mobility    Modified Rankin (Stroke Patients Only)       Balance                                    Cognition Arousal/Alertness: Awake/alert Behavior During Therapy: WFL for tasks assessed/performed Overall Cognitive Status:  Within Functional Limits for tasks assessed                      Exercises Total Joint Exercises Ankle Circles/Pumps: AROM;Both;10 reps;Supine Quad Sets: AROM;Both;10 reps;Supine Gluteal Sets: AROM;Both;10 reps;Supine Heel Slides: AAROM;Right;20 reps;Supine Hip ABduction/ADduction: AAROM;Right;Supine;15 reps    General Comments        Pertinent Vitals/Pain Pain Assessment: 0-10 Pain Score: 6  Pain Location: R hip Pain Descriptors / Indicators: Aching;Sore Pain Intervention(s): Monitored during session;Limited activity within patient's tolerance;Premedicated before session;RN gave pain meds during session    Home Living                      Prior Function            PT Goals (current goals can now be found in the care plan section) Acute Rehab PT Goals Patient Stated Goal: Walk without pain PT Goal Formulation: With patient Time For Goal Achievement: 07/13/14 Potential to Achieve Goals: Good Progress towards PT goals: Progressing toward goals    Frequency  7X/week    PT Plan Current plan remains appropriate    Co-evaluation             End of Session Equipment Utilized During Treatment: Gait belt Activity Tolerance: Patient tolerated treatment well Patient left: in bed;with call bell/phone within reach;with family/visitor present     Time: 4782-9562 PT Time Calculation (min) (ACUTE ONLY): 33 min  Charges:  $Gait Training: 8-22 mins $Therapeutic Exercise: 8-22 mins  G Codes:      Courtney [name] 07/23/2014, 2:24 PM

## 2014-07-09 NOTE — Progress Notes (Signed)
Physical Therapy Treatment Patient Details Name: Courtney [name] MRN: [id number] DOB: September 20, 1950 Today's Date: 07/09/2014    History of Present Illness R THR    PT Comments    Pt very motivated and progressing well with mobility despite c/o increased pain this pm.  Pt hopeful for dc tomorrow.  Follow Up Recommendations  Home health PT     Equipment Recommendations  Rolling walker with 5" wheels    Recommendations for Other Services OT consult     Precautions / Restrictions Precautions Precautions: Fall Restrictions Weight Bearing Restrictions: No Other Position/Activity Restrictions: WBAT    Mobility  Bed Mobility Overal bed mobility: Needs Assistance Bed Mobility: Sit to Supine       Sit to supine: Min assist   General bed mobility comments: cues for sequence and use of L LE to self assist; demonstrated use of belt to self assist R LE onto bed  Transfers Overall transfer level: Needs assistance Equipment used: Rolling walker (2 wheeled) Transfers: Sit to/from Stand Sit to Stand: Supervision         General transfer comment: cues for LE management and use of UEs to self assist  Ambulation/Gait Ambulation/Gait assistance: Min guard Ambulation Distance (Feet): 222 Feet Assistive device: Rolling walker (2 wheeled) Gait Pattern/deviations: Step-to pattern;Decreased step length - right;Decreased step length - left;Shuffle;Trunk flexed Gait velocity: decr   General Gait Details: cues for posture, position from RW and sequence   Stairs Stairs: Yes Stairs assistance: Min assist Stair Management: No rails;Step to pattern;Backwards;With walker Number of Stairs: 1 General stair comments: used stool to access high bed  Wheelchair Mobility    Modified Rankin (Stroke Patients Only)       Balance                                    Cognition Arousal/Alertness: Awake/alert Behavior During Therapy: WFL for tasks assessed/performed Overall  Cognitive Status: Within Functional Limits for tasks assessed                      Exercises      General Comments        Pertinent Vitals/Pain Pain Assessment: 0-10 Pain Score: 6  Pain Location: R hip Pain Descriptors / Indicators: Aching;Sore;Burning Pain Intervention(s): Limited activity within patient's tolerance;Monitored during session;Premedicated before session;Patient requesting pain meds-RN notified;RN gave pain meds during session;Ice applied    Home Living                      Prior Function            PT Goals (current goals can now be found in the care plan section) Acute Rehab PT Goals Patient Stated Goal: Walk without pain PT Goal Formulation: With patient Time For Goal Achievement: 07/13/14 Potential to Achieve Goals: Good Progress towards PT goals: Progressing toward goals    Frequency  7X/week    PT Plan Current plan remains appropriate    Co-evaluation             End of Session Equipment Utilized During Treatment: Gait belt Activity Tolerance: Patient tolerated treatment well;Patient limited by pain Patient left: in bed;with call bell/phone within reach;with family/visitor present     Time: 8099-8338 PT Time Calculation (min) (ACUTE ONLY): 18 min  Charges:  $Gait Training: 8-22 mins  G Codes:      Berthold [name] 03-Aug-2014, 4:59 PM

## 2014-07-09 NOTE — Discharge Instructions (Signed)

## 2014-07-09 NOTE — Progress Notes (Signed)
Subjective: Patient sitting in chair doing well with good pain control has ambulated in her room today   Objective: Vital signs in last 24 hours: Temp:  [97.3 F (36.3 C)-98.4 F (36.9 C)] 98.3 F (36.8 C) (05/21 1017) Pulse Rate:  [53-71] 71 (05/21 1017) Resp:  [16-20] 20 (05/21 1017) BP: (104-159)/(51-75) 128/56 mmHg (05/21 1017) SpO2:  [93 %-100 %] 98 % (05/21 1017) Weight:  [82.101 kg (181 lb)] 82.101 kg (181 lb) (05/20 1045)  Intake/Output from previous day: 05/20 0701 - 05/21 0700 In: 4666.3 [P.O.:600; I.V.:2711.3; IV Piggyback:1355] Out: 3400 [Urine:3200; Blood:200] Intake/Output this shift: Total I/O In: 1005 [P.O.:480; I.V.:525] Out: 350 [Urine:350]  Exam:  Sensation intact distally Intact pulses distally Dorsiflexion/Plantar flexion intact  Labs:  Recent Labs  07/09/14 0440  HGB 9.5*    Recent Labs  07/09/14 0440  WBC 10.8*  RBC 3.18*  HCT 29.0*  PLT 173    Recent Labs  07/09/14 0440  NA 137  K 4.6  CL 105  CO2 25  BUN 14  CREATININE 0.80  GLUCOSE 138*  CALCIUM 8.8*   No results for input(s): LABPT, INR in the last 72 hours.  Assessment/Plan: Plan to mobilize with physical therapy today possible discharge Sunday or Monday   DEAN,GREGORY SCOTT 07/09/2014, 10:32 AM

## 2014-07-09 NOTE — Evaluation (Signed)
Occupational Therapy Evaluation Patient Details Name: Courtney [name] MRN: [id number] DOB: 30-Oct-1950 Today's Date: 07/09/2014    History of Present Illness R THR   Clinical Impression   Patient evaluated by Occupational Therapy with no further acute OT needs identified. All education has been completed and the patient has no further questions. All education completed.  Pt should quickly progress to mod I.  Significant other able to assist pt safely. See below for any follow-up Occupational Therapy or equipment needs. OT is signing off. Thank you for this referral.      Follow Up Recommendations  No OT follow up;Supervision - Intermittent    Equipment Recommendations  None recommended by OT    Recommendations for Other Services       Precautions / Restrictions Precautions Precautions: Fall Restrictions Weight Bearing Restrictions: No Other Position/Activity Restrictions: WBAT      Mobility Bed Mobility Overal bed mobility: Needs Assistance Bed Mobility: Supine to Sit     Supine to sit: Min guard        Transfers Overall transfer level: Needs assistance Equipment used: Rolling walker (2 wheeled) Transfers: Sit to/from [facility] Sit to Stand: Supervision Stand pivot transfers: Supervision            Balance                                            ADL Overall ADL's : Needs assistance/impaired Eating/Feeding: Independent   Grooming: Wash/dry hands;Wash/dry face;Oral care;Supervision/safety;Standing   Upper Body Bathing: Set up;Sitting   Lower Body Bathing: Minimal assistance;Sit to/from stand Lower Body Bathing Details (indicate cue type and reason): assist for feet  Upper Body Dressing : Set up;Sitting   Lower Body Dressing: Minimal assistance;Sit to/from stand Lower Body Dressing Details (indicate cue type and reason): Pt unable to access Rt foot.  significant other is able to assist until pt is able  Toilet  Transfer: Supervision/safety;Ambulation;Comfort height toilet;Grab bars;RW   Toileting- Clothing Manipulation and Hygiene: Supervision/safety;Sit to/from stand   Tub/ Shower Transfer: Min guard;Walk-in shower;Transfer board;3 in 1;Grab bars;Rolling walker Tub/Shower Transfer Details (indicate cue type and reason): Pt instructed in both shower transfer and tub transfer (has one of each) Functional mobility during ADLs: Supervision/safety;Rolling walker General ADL Comments: Pt is very motivated      Estate agent      Pertinent Vitals/Pain Pain Assessment: 0-10 Pain Score: 3  Pain Location: Rt hip Pain Descriptors / Indicators: Aching;Burning;Cramping Pain Intervention(s): Monitored during session     Hand Dominance Right   Extremity/Trunk Assessment Upper Extremity Assessment Upper Extremity Assessment: Overall WFL for tasks assessed   Lower Extremity Assessment Lower Extremity Assessment: Defer to PT evaluation   Cervical / Trunk Assessment Cervical / Trunk Assessment: Normal   Communication Communication Communication: No difficulties   Cognition Arousal/Alertness: Awake/alert Behavior During Therapy: WFL for tasks assessed/performed Overall Cognitive Status: Within Functional Limits for tasks assessed                     General Comments       Exercises       Shoulder Instructions      Home Living Family/patient expects to be discharged to:: Private residence Living Arrangements: Spouse/significant other Available Help at Discharge: Family Type of Home: House Home Access: Stairs to enter [employer]  of Steps: 1+2 Entrance Stairs-Rails: Left Home Layout: One level     Bathroom Shower/Tub: Tub/shower unit;Curtain;Walk-in shower Shower/tub characteristics: Curtain Biochemist, clinical: Standard Bathroom Accessibility: Yes   Home Equipment: Cane - single point;Shower seat;Walker - 4 wheels   Additional Comments:  partner/spouse is very supportive       Prior Functioning/Environment Level of Independence: Independent             OT Diagnosis: Generalized weakness;Acute pain   OT Problem List:     OT Treatment/Interventions:      OT Goals(Current goals can be found in the care plan section) Acute Rehab OT Goals OT Goal Formulation: All assessment and education complete, DC therapy  OT Frequency:     Barriers to D/C:            Co-evaluation              End of Session Equipment Utilized During Treatment: Rolling walker Nurse Communication: Mobility status  Activity Tolerance: Patient tolerated treatment well Patient left: in chair;with call bell/phone within reach;with family/visitor present   Time: 7972-8206 OT Time Calculation (min): 36 min Charges:  OT General Charges $OT Visit: 1 Procedure OT Evaluation $Initial OT Evaluation Tier I: 1 Procedure OT Treatments $Self Care/Home Management : 8-22 mins G-Codes:    Estiven Kohan M 08-03-2014, 10:06 AM

## 2014-07-10 LAB — CBC
HCT: 32.9 % — ABNORMAL LOW (ref 36.0–46.0)
Hemoglobin: 10.8 g/dL — ABNORMAL LOW (ref 12.0–15.0)
MCH: 30.8 pg (ref 26.0–34.0)
MCHC: 32.8 g/dL (ref 30.0–36.0)
MCV: 93.7 fL (ref 78.0–100.0)
PLATELETS: 225 10*3/uL (ref 150–400)
RBC: 3.51 MIL/uL — ABNORMAL LOW (ref 3.87–5.11)
RDW: 12.7 % (ref 11.5–15.5)
WBC: 14.1 10*3/uL — AB (ref 4.0–10.5)

## 2014-07-10 NOTE — Progress Notes (Signed)
Patient doing well Feels better today than yesterday Ambulating in room with minimal difficulty Plan discharge today

## 2014-07-10 NOTE — Care Management Note (Signed)
Case Management Note  Patient Details  Name: Courtney [name] MRN: [id number] Date of Birth: [birth year]  Subjective/Objective:                    Action/Plan:   Expected Discharge Date:                  Expected Discharge Plan:  [place]  In-House Referral:     Discharge planning Services  CM Consult  Post Acute Care Choice:  Home Health Choice offered to:  Patient  DME Arranged:  3-N-1, Walker rolling DME Agency:  [place]:  PT [place]:  [place]  Status of Service:  Completed, signed off  Medicare Important Message Given:    Date Medicare IM Given:    Medicare IM give by:    Date Additional Medicare IM Given:    Additional Medicare Important Message give by:     If discussed at [place] of Stay Meetings, dates discussed:    Additional Comments:  CM spoke with patient at the bedside. [facility] selected for home health physical therapy. Patient needs a RW and 3N1. Jeneen Rinks at [facility] notified of DME request.   Apolonio Schneiders, RN 07/10/2014, 9:41 AM

## 2014-07-10 NOTE — Progress Notes (Signed)
Physical Therapy Treatment Patient Details Name: Courtney [name] MRN: [id number] DOB: April 24, 1950 Today's Date: 07/10/2014    History of Present Illness R THR    PT Comments    POD # 2 pt c/o increased pain today the "started last night".  Assisted with amb in hallway, practiced stairs then returned to room to perform THR TE's following handout HEP.  Positioned in bed with ICE to R hip.   Follow Up Recommendations  Home health PT     Equipment Recommendations  Rolling walker with 5" wheels    Recommendations for Other Services       Precautions / Restrictions Precautions Precautions: Fall Restrictions Weight Bearing Restrictions: No Other Position/Activity Restrictions: WBAT    Mobility  Bed Mobility Overal bed mobility: Needs Assistance Bed Mobility: Sit to Supine       Sit to supine: Min assist   General bed mobility comments: assist R LE  Transfers Overall transfer level: Needs assistance Equipment used: Rolling walker (2 wheeled) Transfers: Sit to/from Stand Sit to Stand: Supervision         General transfer comment: one VC proper hand placement  Ambulation/Gait Ambulation/Gait assistance: Min guard;Supervision Ambulation Distance (Feet): 65 Feet Assistive device: Rolling walker (2 wheeled) Gait Pattern/deviations: Step-to pattern;Step-through pattern;Trunk flexed Gait velocity: decr   General Gait Details: cues for posture, position from RW and sequence.  decreased amb distance due to increased c/o pain   Stairs Stairs: Yes Stairs assistance: Min assist Stair Management: No rails;Step to pattern;Backwards;With walker Number of Stairs: 2 General stair comments: <25% VC's on proper tech and safety.  Wheelchair Mobility    Modified Rankin (Stroke Patients Only)       Balance                                    Cognition Arousal/Alertness: Awake/alert Behavior During Therapy: WFL for tasks assessed/performed Overall  Cognitive Status: Within Functional Limits for tasks assessed                      Exercises   Total Hip Replacement TE's 10 reps ankle pumps 10 reps knee presses 10 reps heel slides 10 reps SAQ's 10 reps ABD Followed by ICE     General Comments        Pertinent Vitals/Pain Pain Assessment: 0-10 Pain Score: 8  Pain Location: R hip Pain Descriptors / Indicators: Aching;Sore Pain Intervention(s): Monitored during session;Premedicated before session;Repositioned;Ice applied    Home Living                      Prior Function            PT Goals (current goals can now be found in the care plan section) Progress towards PT goals: Progressing toward goals    Frequency  7X/week    PT Plan      Co-evaluation             End of Session Equipment Utilized During Treatment: Gait belt Activity Tolerance: Patient tolerated treatment well Patient left: in bed;with call bell/phone within reach;with family/visitor present     Time: 5852-7782 PT Time Calculation (min) (ACUTE ONLY): 28 min  Charges:  $Gait Training: 8-22 mins $Therapeutic Exercise: 8-22 mins                    G Codes:      Courtney [name],  Courtney [name] 07/10/2014, 12:27 PM

## 2014-07-13 ENCOUNTER — Encounter (HOSPITAL_COMMUNITY): Payer: Self-pay | Admitting: Orthopaedic Surgery

## 2014-07-26 NOTE — Discharge Summary (Signed)
Patient ID: Courtney [name] MRN: [id number] DOB/AGE: 10/15/1950 64 y.o.  Admit date: 07/08/2014 Discharge date: 07/26/2014  Admission Diagnoses:  Principal Problem:   Osteoarthritis of right hip Active Problems:   Status post total replacement of right hip   Discharge Diagnoses:  Same  Past Medical History  Diagnosis Date  . Carotid artery disease     40-59% bilateral, doppler December 2010  . Shellfish allergy   . PVC (premature ventricular contraction)     per prior Holter monitor  . Aortic valve sclerosis     echo 54/2706, soft systolic murmur  . Dyslipidemia   . Coronary artery disease     a. BMS-mid RCA 11/2007 b. stress echo 02/2009 subtle inferior HK, lateral ST depressions with stress c. follow-up cath 02/2009: RCA stent patent, severe but stable stenosis of distal posterior lateral branch RCA. LV normal, med tx unless more sx c. ETT Myoview 08/27/12: negative for ischemia; EF 64%  . Right hip pain 12/2009  . Ejection fraction     EF normal, catheterization, 2011 /  EF 55%, echo, 2010  . Fluid overload     Mild, stable since hospitalization in the past  . Depression     Mild, August, 2012  . Myocardial infarction     2009  . Hypertension   . Heart murmur     has, occasional PVC's  . GERD (gastroesophageal reflux disease)     occarionally, takes Pepcid over the counter  . Arthritis     in right hip    Surgeries: Procedure(s): RIGHT TOTAL HIP ARTHROPLASTY ANTERIOR APPROACH on 07/08/2014   Consultants:    Discharged Condition: Improved  Hospital Course: Courtney [name] is an 64 y.o. female who was admitted 07/08/2014 for operative treatment ofOsteoarthritis of right hip. Patient has severe unremitting pain that affects sleep, daily activities, and work/hobbies. After pre-op clearance the patient was taken to the operating room on 07/08/2014 and underwent  Procedure(s): RIGHT TOTAL HIP ARTHROPLASTY ANTERIOR APPROACH.    Patient was given perioperative antibiotics:   Anti-infectives    Start     Dose/Rate Route Frequency Ordered Stop   07/08/14 1300  clindamycin (CLEOCIN) IVPB 600 mg     600 mg 100 mL/hr over 30 Minutes Intravenous Every 6 hours 07/08/14 1050 07/08/14 1936   07/08/14 0535  clindamycin (CLEOCIN) IVPB 900 mg     900 mg 100 mL/hr over 30 Minutes Intravenous On call to O.R. 07/08/14 0535 07/08/14 0726       Patient was given sequential compression devices, early ambulation, and chemoprophylaxis to prevent DVT.  Patient benefited maximally from hospital stay and there were no complications.    Recent vital signs: No data found.    Recent laboratory studies: No results for input(s): WBC, HGB, HCT, PLT, NA, K, CL, CO2, BUN, CREATININE, GLUCOSE, INR, CALCIUM in the last 72 hours.  Invalid input(s): PT, 2   Discharge Medications:     Medication List    STOP taking these medications        oxyCODONE-acetaminophen 5-325 MG per tablet  Commonly known as:  PERCOCET/ROXICET      TAKE these medications        acetaminophen 500 MG tablet  Commonly known as:  TYLENOL  Take 1,000 mg by mouth every 6 (six) hours as needed (Pain).     aspirin 325 MG EC tablet  Take 1 tablet (325 mg total) by mouth 2 (two) times daily after a meal.     Diclofenac  Sodium 2 % Soln  Apply 1 pump twice daily.     famotidine 10 MG chewable tablet  Commonly known as:  PEPCID AC  Chew 10 mg by mouth as needed for heartburn.     methocarbamol 500 MG tablet  Commonly known as:  ROBAXIN  Take 1 tablet (500 mg total) by mouth every 6 (six) hours as needed for muscle spasms.     metoprolol tartrate 25 MG tablet  Commonly known as:  LOPRESSOR  Take 25 mg by mouth daily.     metoprolol tartrate 25 MG tablet  Commonly known as:  LOPRESSOR  TAKE 1/2 TABLET BY MOUTH TWICE A DAY     nitroGLYCERIN 0.4 MG SL tablet  Commonly known as:  NITROSTAT  Place 0.4 mg under the tongue every 5 (five) minutes as needed for chest pain.     oxyCODONE 5 MG immediate  release tablet  Commonly known as:  Oxy IR/ROXICODONE  Take 1-3 tablets (5-15 mg total) by mouth every 3 (three) hours as needed for breakthrough pain.     ramipril 5 MG capsule  Commonly known as:  ALTACE  TAKE 1 CAPSULE BY MOUTH ONCE DAILY     rosuvastatin 40 MG tablet  Commonly known as:  CRESTOR  Take 1 tablet (40 mg total) by mouth daily.     sertraline 50 MG tablet  Commonly known as:  ZOLOFT  Take 50 mg by mouth daily.     Vitamin D (Ergocalciferol) 50000 UNITS Caps capsule  Commonly known as:  DRISDOL  Take 50,000 Units by mouth every 7 (seven) days.        Diagnostic Studies: Dg C-arm 1-60 Min-no Report  07/08/2014   CLINICAL DATA: hip fx   C-ARM 1-60 MINUTES  Fluoroscopy was utilized by the requesting physician.  No radiographic  interpretation.    Dg Hip Unilat With Pelvis 1v Right  07/08/2014   CLINICAL DATA:  Intraoperative films for right hip replacement.  EXAM: RIGHT HIP (WITH PELVIS) 1 VIEW  COMPARISON:  None.  FINDINGS: We are provided with 3 fluoroscopic intraoperative spot views of the right hip. Images demonstrate placement of a right total hip arthroplasty. No complicating feature is identified.  IMPRESSION: ORIF right hip without evidence of complication.   Electronically Signed   By: Inge Rise [name]   On: 07/08/2014 08:49   Dg Hip Port Unilat With Pelvis 1v Right  07/08/2014   CLINICAL DATA:  Status post right total hip replacement.  EXAM: RIGHT HIP (WITH PELVIS) 1 VIEW PORTABLE  COMPARISON:  MRI scan of November 10, 2013.  FINDINGS: The femoral and acetabular components appear to be well situated. No fracture or dislocation is noted. Left hip and sacroiliac joints appear normal.  IMPRESSION: Status post right total hip arthroplasty.   Electronically Signed   By: Marijo Conception, [name]   On: 07/08/2014 10:11    Disposition: 06-Home-Health Care Svc      Discharge Instructions    Call MD / Call 911    Complete by:  As directed   If you experience  chest pain or shortness of breath, CALL 911 and be transported to the hospital emergency room.  If you develope a fever above 101 F, pus (white drainage) or increased drainage or redness at the wound, or calf pain, call your surgeon's office.     Constipation Prevention    Complete by:  As directed   Drink plenty of fluids.  Prune juice may be helpful.  You may use a stool softener, such as Colace (over the counter) 100 mg twice a day.  Use MiraLax (over the counter) for constipation as needed.     Diet - low sodium heart healthy    Complete by:  As directed      Increase activity slowly as tolerated    Complete by:  As directed            Follow-up Information    Follow up with Mcarthur Rossetti, MD In 2 weeks.   Specialty:  Orthopedic Surgery   Contact information:   [place] [address] [phone]       Follow up with [facility]   Why:  Home Health Physical Therapy   Contact information:   [place] [address] [phone]        Signed: Mcarthur Rossetti 07/26/2014, 9:57 PM

## 2014-08-02 ENCOUNTER — Encounter: Payer: Self-pay | Admitting: Family Medicine

## 2014-08-19 ENCOUNTER — Ambulatory Visit (INDEPENDENT_AMBULATORY_CARE_PROVIDER_SITE_OTHER): Payer: 59 | Admitting: Family Medicine

## 2014-08-19 ENCOUNTER — Encounter: Payer: Self-pay | Admitting: Family Medicine

## 2014-08-19 VITALS — BP 114/76 | HR 61 | Ht 61.0 in | Wt 176.0 lb

## 2014-08-19 DIAGNOSIS — M542 Cervicalgia: Secondary | ICD-10-CM | POA: Diagnosis not present

## 2014-08-19 DIAGNOSIS — M999 Biomechanical lesion, unspecified: Secondary | ICD-10-CM

## 2014-08-19 DIAGNOSIS — M9902 Segmental and somatic dysfunction of thoracic region: Secondary | ICD-10-CM | POA: Diagnosis not present

## 2014-08-19 DIAGNOSIS — M9901 Segmental and somatic dysfunction of cervical region: Secondary | ICD-10-CM | POA: Diagnosis not present

## 2014-08-19 DIAGNOSIS — M9904 Segmental and somatic dysfunction of sacral region: Secondary | ICD-10-CM

## 2014-08-19 MED ORDER — GABAPENTIN 100 MG PO CAPS: 100.0000 mg | ORAL_CAPSULE | Freq: Every day | ORAL | Status: DC

## 2014-08-19 NOTE — Progress Notes (Signed)
  [name] [address], [place] [address] Phone: [phone] Subjective:    CC: Upper back and hip pain  [id number] Courtney [name] is a 64 y.o. female coming in with complaint of upper back pain. Patient did have a hip replacement recently and has noticed some mild improvement with the hip and lower back. Patient was having more of the upper back that she thinks is from all the rehabilitation. Patient has been doing the exercises intermittently but has been focusing more on her hip. Denies any radiation down the legs any numbness or tingling. States though that it is sore and starting to affect her daily activities. Patient is starting back at work in the next week.     Past medical history, social, surgical and family history all reviewed in electronic medical record.   Review of Systems: No headache, visual changes, nausea, vomiting, diarrhea, constipation, dizziness, abdominal pain, skin rash, fevers, chills, night sweats, weight loss, swollen lymph nodes, body aches, joint swelling, muscle aches, chest pain, shortness of breath, mood changes.   Objective Blood pressure 114/76, pulse 61, height 5\' 1"  (1.549 m), weight 176 lb (79.833 kg), SpO2 96 %.  General: No apparent distress alert and oriented x3 mood and affect normal, dressed appropriately.  HEENT: Pupils equal, extraocular movements intact  Respiratory: Patient's speak in full sentences and does not appear short of breath  Cardiovascular: No lower extremity edema, non tender, no erythema  Skin: Warm dry intact with no signs of infection or rash on extremities or on axial skeleton.  Abdomen: Soft nontender  Neuro: Cranial nerves II through XII are intact, neurovascularly intact in all extremities with 2+ DTRs and 2+ pulses.  Lymph: No lymphadenopathy of posterior or anterior cervical chain or axillae bilaterally.  Gait normal with good balance and coordination. Better than previously as  well. MSK:  Non tender with full range of motion and good stability and symmetric strength and tone of shoulders, elbows, wrist, , knee and ankles bilaterally Continued tightness near the trapezius muscles bilaterally. Neck: Inspection unremarkable. No palpable stepoffs. Negative Spurling's maneuver. Continued tightness but full range of motion Grip strength and sensation normal in bilateral hands Strength good C4 to T1 distribution No sensory change to C4 to T1 Negative Hoffman sign bilaterally Reflexes normal   Osteopathic findings Cervical C4 flexed rotated and side bent right Thoracic T1 extended rotated and side bent right elevated first rib T3 extended rotated and side bent left T8 extended rotated and side bent right      Impression and Recommendations:     This case required medical decision making of moderate complexity.

## 2014-08-19 NOTE — Patient Instructions (Signed)
Good to see you Courtney [name] I would continue 2 tennis balls in a tube sock place where head meets neck and lay on it.  Gabapentin 100mg  at night Muscle relaxer if you need it.  See me again in 3 weeks if continues.

## 2014-08-19 NOTE — Progress Notes (Signed)
Pre visit review using our clinic review tool, if applicable. No additional management support is needed unless otherwise documented below in the visit note. 

## 2014-08-19 NOTE — Assessment & Plan Note (Addendum)
Decision today to treat with OMT was based on Physical Exam  After verbal consent patient was treated with HVLA, ME techniques in cervical, thoracic and rib areas  Patient tolerated the procedure well with improvement in symptoms  Patient given exercises, stretches and lifestyle modifications  See medications in patient instructions if given  Patient will follow up in  Weeks 3-4 weeks

## 2014-08-19 NOTE — Assessment & Plan Note (Signed)
Discussed with patient to continue to focus on more the postural changes and we discussed again the ergonomics at work when she returns. Patient does have a muscle relaxer and patient will be put on gabapentin low-dose at night. We discussed icing regimen as well. Patient will come back and see me again in 3-4 weeks for further evaluation and treatment.  Spent  25 minutes with patient face-to-face and had greater than 50% of counseling including as described above in assessment and plan.

## 2014-10-17 ENCOUNTER — Ambulatory Visit: Payer: 59 | Admitting: Family Medicine

## 2014-10-17 DIAGNOSIS — Z0289 Encounter for other administrative examinations: Secondary | ICD-10-CM

## 2014-11-10 ENCOUNTER — Encounter: Payer: Self-pay | Admitting: Family

## 2014-11-10 ENCOUNTER — Ambulatory Visit (INDEPENDENT_AMBULATORY_CARE_PROVIDER_SITE_OTHER): Payer: 59 | Admitting: Family

## 2014-11-10 VITALS — BP 138/88 | HR 59 | Temp 97.8°F | Resp 18 | Ht 61.0 in | Wt 178.8 lb

## 2014-11-10 DIAGNOSIS — F329 Major depressive disorder, single episode, unspecified: Secondary | ICD-10-CM

## 2014-11-10 DIAGNOSIS — F32A Depression, unspecified: Secondary | ICD-10-CM

## 2014-11-10 DIAGNOSIS — J069 Acute upper respiratory infection, unspecified: Secondary | ICD-10-CM | POA: Insufficient documentation

## 2014-11-10 MED ORDER — HYDROCODONE-HOMATROPINE 5-1.5 MG/5ML PO SYRP: 5.0000 mL | ORAL_SOLUTION | Freq: Three times a day (TID) | ORAL | Status: DC | PRN

## 2014-11-10 NOTE — Patient Instructions (Addendum)
Thank you for choosing [facility].  Summary/Instructions:  Please stop taking the Zoloft.  Please schedule a follow up visit for an annual exam.   Your prescription(s) have been submitted to your pharmacy or been printed and provided for you. Please take as directed and contact our office if you believe you are having problem(s) with the medication(s) or have any questions.  If your symptoms worsen or fail to improve, please contact our office for further instruction, or in case of emergency go directly to the emergency room at the closest medical facility.   General Recommendations:    Please drink plenty of fluids.  Get plenty of rest   Sleep in humidified air  Use saline nasal sprays  Netti pot   OTC Medications:  Decongestants - helps relieve congestion   Flonase (generic fluticasone) or Nasacort (generic triamcinolone) - please make sure to use the "cross-over" technique at a 45 degree angle towards the opposite eye as opposed to straight up the nasal passageway.   Sudafed (generic pseudoephedrine - Note this is the one that is available behind the pharmacy counter); Products with phenylephrine (-PE) may also be used but is often not as effective as pseudoephedrine.   If you have HIGH BLOOD PRESSURE - Coricidin HBP; AVOID any product that is -D as this contains pseudoephedrine which may increase your blood pressure.  Afrin (oxymetazoline) every 6-8 hours for up to 3 days.   Allergies - helps relieve runny nose, itchy eyes and sneezing   Claritin (generic loratidine), Allegra (fexofenidine), or Zyrtec (generic cyrterizine) for runny nose. These medications should not cause drowsiness.  Note - Benadryl (generic diphenhydramine) may be used however may cause drowsiness  Cough -   Delsym or Robitussin (generic dextromethorphan)  Expectorants - helps loosen mucus to ease removal   Mucinex (generic guaifenesin) as directed on the package.  Headaches /  General Aches   Tylenol (generic acetaminophen) - DO NOT EXCEED 3 grams (3,000 mg) in a 24 hour time period  Advil/Motrin (generic ibuprofen)   Sore Throat -   Salt water gargle   Chloraseptic (generic benzocaine) spray or lozenges / Sucrets (generic dyclonine)

## 2014-11-10 NOTE — Assessment & Plan Note (Signed)
Symptoms and exam consistent with acute upper respiratory infection most likely viral. Continue conservative treatment with over-the-counter medications as needed for symptom relief and supportive care. Start Hycodan as needed for cough and sleep. Follow-up if symptoms worsen or fail to improve.

## 2014-11-10 NOTE — Assessment & Plan Note (Signed)
Notes self-weaning of her Zoloft down to 25 mg for the past week and has noted no adverse side effects. Denies any feelings of depression and does not believe she needs to continue the medication. Ok to discontinue Zoloft. Denies suicidal ideations or other depression symptoms. Continue to monitor at this time and follow up as needed.

## 2014-11-10 NOTE — Progress Notes (Signed)
Subjective:    Patient ID: Courtney [name], female    DOB: 1950-11-09, 64 y.o.   MRN: [id number]  Chief Complaint  Patient presents with  . Establish Care    Cough, drainage and congestion    HPI:  [name] is a 63 y.o. female who  has a past medical history of Carotid artery disease; Shellfish allergy; PVC (premature ventricular contraction); Aortic valve sclerosis; Dyslipidemia; Coronary artery disease; Right hip pain (12/2009); Ejection fraction; Fluid overload; Depression; Myocardial infarction; Hypertension; Heart murmur; GERD (gastroesophageal reflux disease); Arthritis; and Hyperlipidemia. and presents today for an office visit to establish care.     1.) Cough - This is a new problem. Associated symptom of cough, congestion, and drainage has been going on for about 5 days. Notes that she received her flu shot sometime last week. Describes the cough as non-productive with a severity to wake her up through the night. Modifying factors include Claratin and Mucinex Cough which have not helped with her symptoms very much. The course of the illness has been about the same with possible improvement today. Denies antibiotic use recently.  2.) Depression - Previously diagnosed with depression and is maintained on Zoloft and recently started to self-wean and has not noted any adverse side effects. Reports that she does not feel depressed and has Courtney thoughts of suicide.   Allergies  Allergen Reactions  . Erythromycin Hives  . Penicillins Hives  . Shellfish Allergy Hives     Outpatient Prescriptions Prior to Visit  Medication Sig Dispense Refill  . acetaminophen (TYLENOL) 500 MG tablet Take 1,000 mg by mouth every 6 (six) hours as needed (Pain).     [name] aspirin 81 MG tablet Take 81 mg by mouth daily.    . Diclofenac Sodium 2 % SOLN Apply 1 pump twice daily. (Patient taking differently: Apply 1 application topically 2 (two) times daily as needed. Apply 1 pump twice daily.) 112 g 3  .  famotidine (PEPCID AC) 10 MG chewable tablet Chew 10 mg by mouth as needed for heartburn.    . methocarbamol (ROBAXIN) 500 MG tablet Take 1 tablet (500 mg total) by mouth every 6 (six) hours as needed for muscle spasms. 60 tablet 0  . metoprolol tartrate (LOPRESSOR) 25 MG tablet Take 25 mg by mouth daily.    . nitroGLYCERIN (NITROSTAT) 0.4 MG SL tablet Place 0.4 mg under the tongue every 5 (five) minutes as needed for chest pain.    . ramipril (ALTACE) 5 MG capsule TAKE 1 CAPSULE BY MOUTH ONCE DAILY 90 capsule 3  . rosuvastatin (CRESTOR) 40 MG tablet Take 1 tablet (40 mg total) by mouth daily. 90 tablet 3  . gabapentin (NEURONTIN) 100 MG capsule Take 1 capsule (100 mg total) by mouth at bedtime. 30 capsule 3  . sertraline (ZOLOFT) 50 MG tablet Take 50 mg by mouth daily.      . Vitamin D, Ergocalciferol, (DRISDOL) 50000 UNITS CAPS capsule Take 50,000 Units by mouth every 7 (seven) days.     Courtney facility-administered medications prior to visit.     Past Medical History  Diagnosis Date  . Carotid artery disease     40-59% bilateral, doppler December 2010  . Shellfish allergy   . PVC (premature ventricular contraction)     per prior Holter monitor  . Aortic valve sclerosis     echo 61/9509, soft systolic murmur  . Dyslipidemia   . Coronary artery disease     a. BMS-mid RCA 11/2007  b. stress echo 02/2009 subtle inferior HK, lateral ST depressions with stress c. follow-up cath 02/2009: RCA stent patent, severe but stable stenosis of distal posterior lateral branch RCA. LV normal, med tx unless more sx c. ETT Myoview 08/27/12: negative for ischemia; EF 64%  . Right hip pain 12/2009  . Ejection fraction     EF normal, catheterization, 2011 /  EF 55%, echo, 2010  . Fluid overload     Mild, stable since hospitalization in the past  . Depression     Mild, August, 2012  . Myocardial infarction     2009  . Hypertension   . Heart murmur     has, occasional PVC's  . GERD (gastroesophageal reflux  disease)     occarionally, takes Pepcid over the counter  . Arthritis     in right hip  . Hyperlipidemia      Past Surgical History  Procedure Laterality Date  . Coronary angioplasty with stent placement  11/2007    BMS-mid RCA  . Cardiac catheterization  1/211    Patent RCA stent, stenotic PLB supplying small distribution; medically managed  . Diagnostic laparoscopy      1982 for endometriosis  . Hx of wisdom teeth surgery    . Dilation and curettage of uterus  2005  . 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 Heart attack Father 35  . Stroke Mother   . Heart attack Paternal Grandfather     Social History   Social History  . Marital Status: Divorced    Spouse Name: N/A  . Number of Children: 1  . Years of Education: 31   Occupational History  . Nursing Director    Social History Main Topics  . Smoking status: Never Smoker   . Smokeless tobacco: Never Used  . Alcohol Use: 4.2 oz/week    7 Glasses of wine per week     Comment: 1 glass of wine/night  . Drug Use: Courtney  . Sexual Activity: Yes   Other Topics Concern  . Not on file   Social History Narrative   Fun: Dance, garden,    Denies religious beliefs effecting health care.   Denies abuse and feels safe at home    Review of Systems  Constitutional: Negative for fever and chills.  HENT: Positive for congestion, sinus pressure and sneezing. Negative for sore throat.   Respiratory: Positive for cough. Negative for chest tightness and shortness of breath.   Neurological: Negative for headaches.      Objective:    BP 138/88 mmHg  Pulse 59  Temp(Src) 97.8 F (36.6 C) (Oral)  Resp 18  Ht 5\' 1"  (1.549 m)  Wt 178 lb 12.8 oz (81.103 kg)  BMI 33.80 kg/m2  SpO2 97% Nursing note and vital signs  reviewed.  Physical Exam  Constitutional: She is oriented to person, place, and time. She appears well-developed and well-nourished. Courtney distress.  HENT:  Right Ear: Hearing, tympanic membrane, external ear and ear canal normal.  Left Ear: Hearing, tympanic membrane, external ear and ear canal normal.  Nose: Right sinus exhibits Courtney maxillary sinus tenderness and Courtney frontal sinus tenderness. Left sinus exhibits Courtney maxillary sinus tenderness and Courtney frontal sinus tenderness.  Mouth/Throat: Uvula is midline, oropharynx is clear and moist and  mucous membranes are normal.  Cardiovascular: Normal rate, regular rhythm, normal heart sounds and intact distal pulses.   Pulmonary/Chest: Effort normal and breath sounds normal.  Neurological: She is alert and oriented to person, place, and time.  Skin: Skin is warm and dry.  Psychiatric: She has a normal mood and affect. Her behavior is normal. Judgment and thought content normal.       Assessment & Plan:   Problem List Items Addressed This Visit      Respiratory   Acute upper respiratory infection    Symptoms and exam consistent with acute upper respiratory infection most likely viral. Continue conservative treatment with over-the-counter medications as needed for symptom relief and supportive care. Start Hycodan as needed for cough and sleep. Follow-up if symptoms worsen or fail to improve.      Relevant Medications   HYDROcodone-homatropine (HYCODAN) 5-1.5 MG/5ML syrup     Other   Depression - Primary    Notes self-weaning of her Zoloft down to 25 mg for the past week and has noted Courtney adverse side effects. Denies any feelings of depression and does not believe she needs to continue the medication. Ok to discontinue Zoloft. Denies suicidal ideations or other depression symptoms. Continue to monitor at this time and follow up as needed.

## 2014-11-15 ENCOUNTER — Encounter: Payer: Self-pay | Admitting: Family

## 2014-11-15 MED ORDER — LEVOFLOXACIN 500 MG PO TABS: 500.0000 mg | ORAL_TABLET | Freq: Every day | ORAL | Status: DC

## 2014-12-08 ENCOUNTER — Other Ambulatory Visit: Payer: Self-pay | Admitting: Cardiology

## 2014-12-08 DIAGNOSIS — I6523 Occlusion and stenosis of bilateral carotid arteries: Secondary | ICD-10-CM

## 2014-12-19 ENCOUNTER — Ambulatory Visit (HOSPITAL_COMMUNITY)
Admission: RE | Admit: 2014-12-19 | Discharge: 2014-12-19 | Disposition: A | Discharge status: Home / Self Care | Payer: 59 | Source: Ambulatory Visit | Attending: Cardiology | Admitting: Cardiology

## 2014-12-19 DIAGNOSIS — I10 Essential (primary) hypertension: Secondary | ICD-10-CM | POA: Diagnosis not present

## 2014-12-19 DIAGNOSIS — I6523 Occlusion and stenosis of bilateral carotid arteries: Secondary | ICD-10-CM

## 2014-12-19 DIAGNOSIS — E785 Hyperlipidemia, unspecified: Secondary | ICD-10-CM | POA: Diagnosis not present

## 2014-12-26 ENCOUNTER — Other Ambulatory Visit: Payer: Self-pay | Admitting: Cardiology

## 2014-12-30 ENCOUNTER — Ambulatory Visit: Payer: Self-pay | Admitting: Family

## 2015-01-06 ENCOUNTER — Encounter: Payer: Self-pay | Admitting: Family Medicine

## 2015-01-06 ENCOUNTER — Ambulatory Visit (INDEPENDENT_AMBULATORY_CARE_PROVIDER_SITE_OTHER): Payer: 59 | Admitting: Family Medicine

## 2015-01-06 ENCOUNTER — Ambulatory Visit (INDEPENDENT_AMBULATORY_CARE_PROVIDER_SITE_OTHER): Payer: 59 | Admitting: Family

## 2015-01-06 ENCOUNTER — Encounter: Payer: Self-pay | Admitting: Family

## 2015-01-06 VITALS — BP 120/82 | HR 66 | Ht 61.0 in | Wt 182.0 lb

## 2015-01-06 VITALS — BP 128/80 | HR 55 | Temp 98.0°F | Resp 18 | Ht 61.0 in | Wt 182.0 lb

## 2015-01-06 DIAGNOSIS — M9904 Segmental and somatic dysfunction of sacral region: Secondary | ICD-10-CM | POA: Diagnosis not present

## 2015-01-06 DIAGNOSIS — M9902 Segmental and somatic dysfunction of thoracic region: Secondary | ICD-10-CM | POA: Diagnosis not present

## 2015-01-06 DIAGNOSIS — Z96649 Presence of unspecified artificial hip joint: Secondary | ICD-10-CM

## 2015-01-06 DIAGNOSIS — Z23 Encounter for immunization: Secondary | ICD-10-CM

## 2015-01-06 DIAGNOSIS — M24552 Contracture, left hip: Secondary | ICD-10-CM

## 2015-01-06 DIAGNOSIS — M9901 Segmental and somatic dysfunction of cervical region: Secondary | ICD-10-CM | POA: Diagnosis not present

## 2015-01-06 DIAGNOSIS — Z1231 Encounter for screening mammogram for malignant neoplasm of breast: Secondary | ICD-10-CM | POA: Diagnosis not present

## 2015-01-06 DIAGNOSIS — M999 Biomechanical lesion, unspecified: Secondary | ICD-10-CM

## 2015-01-06 DIAGNOSIS — Z96641 Presence of right artificial hip joint: Secondary | ICD-10-CM

## 2015-01-06 DIAGNOSIS — Z0000 Encounter for general adult medical examination without abnormal findings: Secondary | ICD-10-CM | POA: Diagnosis not present

## 2015-01-06 DIAGNOSIS — M24559 Contracture, unspecified hip: Secondary | ICD-10-CM | POA: Insufficient documentation

## 2015-01-06 MED ORDER — GABAPENTIN 100 MG PO CAPS: 200.0000 mg | ORAL_CAPSULE | Freq: Every day | ORAL | Status: DC

## 2015-01-06 MED ORDER — OMEPRAZOLE 40 MG PO CPDR: 40.0000 mg | DELAYED_RELEASE_CAPSULE | Freq: Every day | ORAL | Status: DC

## 2015-01-06 MED ORDER — DICLOFENAC SODIUM 2 % TD SOLN: 1.0000 "application " | Freq: Two times a day (BID) | TRANSDERMAL | Status: DC | PRN

## 2015-01-06 MED ORDER — ZOSTER VACCINE LIVE 19400 UNT/0.65ML ~~LOC~~ SOLR: 0.6500 mL | Freq: Once | SUBCUTANEOUS | Status: DC

## 2015-01-06 NOTE — Progress Notes (Signed)
Pre visit review using our clinic review tool, if applicable. No additional management support is needed unless otherwise documented below in the visit note. 

## 2015-01-06 NOTE — Assessment & Plan Note (Signed)
Weakness of the right hip flexor and quadriceps noted somewhat. Patient likely compensating with causing some of the back pain.

## 2015-01-06 NOTE — Assessment & Plan Note (Signed)
Patient does have tight hip flexors on the left side. Describes it as more of a dull, throbbing aching sensation. Denies any numbness. Patient work with Product/process development scientist today to learn home exercises in greater detail. I do think that some of the pain could be secondary to radicular symptoms. Patient given gabapentin at a very low dose at night. I do not feel that further workup such as be beneficial to sign. Patient has muscle relaxer if needed. Patient come back and see me again in 3-4 weeks for further evaluation and treatment.

## 2015-01-06 NOTE — Progress Notes (Signed)
  [name] [address], [place] [address] Phone: [phone] Subjective:    CC: Upper back and hip pain  [id number] Courtney [name] is a 64 y.o. female coming in with complaint of upper back pain. Patient has not been seen for greater than 3 months. Patient had been doing very well but states having increasing back pain. Seems to be right and left side. Lower back. No radiation down the leg. Has had weakness of the right leg since the hip surgery. Not making significant improvement. Trying to do exercises but makes some back pain worse. Patient states that it is more of a dull ache at all times. Sometimes can of spasms. Not responding to over-the-counter medications. Has responded to osteopathic manipulation previously.     Past medical history, social, surgical and family history all reviewed in electronic medical record.   Review of Systems: No headache, visual changes, nausea, vomiting, diarrhea, constipation, dizziness, abdominal pain, skin rash, fevers, chills, night sweats, weight loss, swollen lymph nodes, body aches, joint swelling, muscle aches, chest pain, shortness of breath, mood changes.   Objective Blood pressure 120/82, pulse 66, height 5\' 1"  (1.549 m), weight 182 lb (82.555 kg), SpO2 99 %.  General: No apparent distress alert and oriented x3 mood and affect normal, dressed appropriately.  HEENT: Pupils equal, extraocular movements intact  Respiratory: Patient's speak in full sentences and does not appear short of breath  Cardiovascular: No lower extremity edema, non tender, no erythema  Skin: Warm dry intact with no signs of infection or rash on extremities or on axial skeleton.  Abdomen: Soft nontender  Neuro: Cranial nerves II through XII are intact, neurovascularly intact in all extremities with 2+ DTRs and 2+ pulses.  Lymph: No lymphadenopathy of posterior or anterior cervical chain or axillae bilaterally.  Gait normal with good  balance and coordination. Better than previously as well. MSK:  Non tender with full range of motion and good stability and symmetric strength and tone of shoulders, elbows, wrist, , knee and ankles bilaterally Continued tightness near the trapezius muscles bilaterally. Neck: Inspection unremarkable. No palpable stepoffs. Negative Spurling's maneuver. Continued tightness but full range of motion Grip strength and sensation normal in bilateral hands Strength good C4 to T1 distribution No sensory change to C4 to T1 Negative Hoffman sign bilaterally Reflexes normal Low back exam shows the patient does have near full range of motion lacking the last 10 of extension. Patient is tender to palpation in the left paraspinal musculature of the lumbar spine. Negative straight leg test bilaterally. Mild weakness of the right leg compared to the left leg secondary to pain in the quadricep.   Osteopathic findings Cervical C4 flexed rotated and side bent right Thoracic T1 extended rotated and side bent right elevated first rib T3 extended rotated and side bent left T8 extended rotated and side bent right  L2 flexed rotated and side bent Sacrum left on left     Impression and Recommendations:     This case required medical decision making of moderate complexity.

## 2015-01-06 NOTE — Patient Instructions (Signed)
Thank you for choosing [facility].  Summary/Instructions:  Your prescription(s) have been submitted to your pharmacy or been printed and provided for you. Please take as directed and contact our office if you believe you are having problem(s) with the medication(s) or have any questions.  Please stop by the lab on the basement level of the building for your blood work. Your results will be released to [place] (or called to you) after review, usually within 72 hours after test completion. If any changes need to be made, you will be notified at that same time.  If your symptoms worsen or fail to improve, please contact our office for further instruction, or in case of emergency go directly to the emergency room at the closest medical facility.   Health Maintenance, Female Adopting a healthy lifestyle and getting preventive care can go a long way to promote health and wellness. Talk with your health care provider about what schedule of regular examinations is right for you. This is a good chance for you to check in with your provider about disease prevention and staying healthy. In between checkups, there are plenty of things you can do on your own. Experts have done a lot of research about which lifestyle changes and preventive measures are most likely to keep you healthy. Ask your health care provider for more information. WEIGHT AND DIET  Eat a healthy diet  Be sure to include plenty of vegetables, fruits, low-fat dairy products, and lean protein.  Do not eat a lot of foods high in solid fats, added sugars, or salt.  Get regular exercise. This is one of the most important things you can do for your health.  Most adults should exercise for at least 150 minutes each week. The exercise should increase your heart rate and make you sweat (moderate-intensity exercise).  Most adults should also do strengthening exercises at least twice a week. This is in addition to the moderate-intensity  exercise.  Maintain a healthy weight  Body mass index (BMI) is a measurement that can be used to identify possible weight problems. It estimates body fat based on height and weight. Your health care provider can help determine your BMI and help you achieve or maintain a healthy weight.  For females 51 years of age and older:   A BMI below 18.5 is considered underweight.  A BMI of 18.5 to 24.9 is normal.  A BMI of 25 to 29.9 is considered overweight.  A BMI of 30 and above is considered obese.  Watch levels of cholesterol and blood lipids  You should start having your blood tested for lipids and cholesterol at 66 years of age, then have this test every 5 years.  You may need to have your cholesterol levels checked more often if:  Your lipid or cholesterol levels are high.  You are older than 66 years of age.  You are at high risk for heart disease.  CANCER SCREENING   Lung Cancer  Lung cancer screening is recommended for adults 54-55 years old who are at high risk for lung cancer because of a history of smoking.  A yearly low-dose CT scan of the lungs is recommended for people who:  Currently smoke.  Have quit within the past 15 years.  Have at least a 30-pack-year history of smoking. A pack year is smoking an average of one pack of cigarettes a day for 1 year.  Yearly screening should continue until it has been 15 years since you quit.  Yearly screening should stop if you develop a health problem that would prevent you from having lung cancer treatment.  Breast Cancer  Practice breast self-awareness. This means understanding how your breasts normally appear and feel.  It also means doing regular breast self-exams. Let your health care provider know about any changes, no matter how small.  If you are in your 20s or 30s, you should have a clinical breast exam (CBE) by a health care provider every 1-3 years as part of a regular health exam.  If you are 33 or  older, have a CBE every year. Also consider having a breast X-ray (mammogram) every year.  If you have a family history of breast cancer, talk to your health care provider about genetic screening.  If you are at high risk for breast cancer, talk to your health care provider about having an MRI and a mammogram every year.  Breast cancer gene (BRCA) assessment is recommended for women who have family members with BRCA-related cancers. BRCA-related cancers include:  Breast.  Ovarian.  Tubal.  Peritoneal cancers.  Results of the assessment will determine the need for genetic counseling and BRCA1 and BRCA2 testing. Cervical Cancer Your health care provider may recommend that you be screened regularly for cancer of the pelvic organs (ovaries, uterus, and vagina). This screening involves a pelvic examination, including checking for microscopic changes to the surface of your cervix (Pap test). You may be encouraged to have this screening done every 3 years, beginning at age 33.  For women ages 40-65, health care providers may recommend pelvic exams and Pap testing every 3 years, or they may recommend the Pap and pelvic exam, combined with testing for human papilloma virus (HPV), every 5 years. Some types of HPV increase your risk of cervical cancer. Testing for HPV may also be done on women of any age with unclear Pap test results.  Other health care providers may not recommend any screening for nonpregnant women who are considered low risk for pelvic cancer and who do not have symptoms. Ask your health care provider if a screening pelvic exam is right for you.  If you have had past treatment for cervical cancer or a condition that could lead to cancer, you need Pap tests and screening for cancer for at least 20 years after your treatment. If Pap tests have been discontinued, your risk factors (such as having a new sexual partner) need to be reassessed to determine if screening should resume. Some  women have medical problems that increase the chance of getting cervical cancer. In these cases, your health care provider may recommend more frequent screening and Pap tests. Colorectal Cancer  This type of cancer can be detected and often prevented.  Routine colorectal cancer screening usually begins at 66 years of age and continues through 66 years of age.  Your health care provider may recommend screening at an earlier age if you have risk factors for colon cancer.  Your health care provider may also recommend using home test kits to check for hidden blood in the stool.  A small camera at the end of a tube can be used to examine your colon directly (sigmoidoscopy or colonoscopy). This is done to check for the earliest forms of colorectal cancer.  Routine screening usually begins at age 15.  Direct examination of the colon should be repeated every 5-10 years through 66 years of age. However, you may need to be screened more often if early forms of precancerous polyps or  small growths are found. Skin Cancer  Check your skin from head to toe regularly.  Tell your health care provider about any new moles or changes in moles, especially if there is a change in a mole's shape or color.  Also tell your health care provider if you have a mole that is larger than the size of a pencil eraser.  Always use sunscreen. Apply sunscreen liberally and repeatedly throughout the day.  Protect yourself by wearing long sleeves, pants, a wide-brimmed hat, and sunglasses whenever you are outside. HEART DISEASE, DIABETES, AND HIGH BLOOD PRESSURE   High blood pressure causes heart disease and increases the risk of stroke. High blood pressure is more likely to develop in:  People who have blood pressure in the high end of the normal range (130-139/85-89 mm Hg).  People who are overweight or obese.  People who are African American.  If you are 18-39 years of age, have your blood pressure checked every  3-5 years. If you are 40 years of age or older, have your blood pressure checked every year. You should have your blood pressure measured twice--once when you are at a hospital or clinic, and once when you are not at a hospital or clinic. Record the average of the two measurements. To check your blood pressure when you are not at a hospital or clinic, you can use:  An automated blood pressure machine at a pharmacy.  A home blood pressure monitor.  If you are between 55 years and 79 years old, ask your health care provider if you should take aspirin to prevent strokes.  Have regular diabetes screenings. This involves taking a blood sample to check your fasting blood sugar level.  If you are at a normal weight and have a low risk for diabetes, have this test once every three years after 66 years of age.  If you are overweight and have a high risk for diabetes, consider being tested at a younger age or more often. PREVENTING INFECTION  Hepatitis B  If you have a higher risk for hepatitis B, you should be screened for this virus. You are considered at high risk for hepatitis B if:  You were born in a country where hepatitis B is common. Ask your health care provider which countries are considered high risk.  Your parents were born in a high-risk country, and you have not been immunized against hepatitis B (hepatitis B vaccine).  You have HIV or AIDS.  You use needles to inject street drugs.  You live with someone who has hepatitis B.  You have had sex with someone who has hepatitis B.  You get hemodialysis treatment.  You take certain medicines for conditions, including cancer, organ transplantation, and autoimmune conditions. Hepatitis C  Blood testing is recommended for:  Everyone born from 1945 through 1965.  Anyone with known risk factors for hepatitis C. Sexually transmitted infections (STIs)  You should be screened for sexually transmitted infections (STIs) including  gonorrhea and chlamydia if:  You are sexually active and are younger than 66 years of age.  You are older than 66 years of age and your health care provider tells you that you are at risk for this type of infection.  Your sexual activity has changed since you were last screened and you are at an increased risk for chlamydia or gonorrhea. Ask your health care provider if you are at risk.  If you do not have HIV, but are at risk, it may be   recommended that you take a prescription medicine daily to prevent HIV infection. This is called pre-exposure prophylaxis (PrEP). You are considered at risk if:  You are sexually active and do not regularly use condoms or know the HIV status of your partner(s).  You take drugs by injection.  You are sexually active with a partner who has HIV. Talk with your health care provider about whether you are at high risk of being infected with HIV. If you choose to begin PrEP, you should first be tested for HIV. You should then be tested every 3 months for as long as you are taking PrEP.  PREGNANCY   If you are premenopausal and you may become pregnant, ask your health care provider about preconception counseling.  If you may become pregnant, take 400 to 800 micrograms (mcg) of folic acid every day.  If you want to prevent pregnancy, talk to your health care provider about birth control (contraception). OSTEOPOROSIS AND MENOPAUSE   Osteoporosis is a disease in which the bones lose minerals and strength with aging. This can result in serious bone fractures. Your risk for osteoporosis can be identified using a bone density scan.  If you are 65 years of age or older, or if you are at risk for osteoporosis and fractures, ask your health care provider if you should be screened.  Ask your health care provider whether you should take a calcium or vitamin D supplement to lower your risk for osteoporosis.  Menopause may have certain physical symptoms and  risks.  Hormone replacement therapy may reduce some of these symptoms and risks. Talk to your health care provider about whether hormone replacement therapy is right for you.  HOME CARE INSTRUCTIONS   Schedule regular health, dental, and eye exams.  Stay current with your immunizations.   Do not use any tobacco products including cigarettes, chewing tobacco, or electronic cigarettes.  If you are pregnant, do not drink alcohol.  If you are breastfeeding, limit how much and how often you drink alcohol.  Limit alcohol intake to no more than 1 drink per day for nonpregnant women. One drink equals 12 ounces of beer, 5 ounces of wine, or 1 ounces of hard liquor.  Do not use street drugs.  Do not share needles.  Ask your health care provider for help if you need support or information about quitting drugs.  Tell your health care provider if you often feel depressed.  Tell your health care provider if you have ever been abused or do not feel safe at home.   This information is not intended to replace advice given to you by your health care provider. Make sure you discuss any questions you have with your health care provider.   Document Released: 08/20/2010 Document Revised: 02/25/2014 Document Reviewed: 01/06/2013 Elsevier Interactive Patient Education 2016 [employer]vier Inc.   

## 2015-01-06 NOTE — Progress Notes (Signed)
Subjective:    Patient ID: Courtney [name], female    DOB: 27-Jun-1950, 64 y.o.   MRN: [id number]  Chief Complaint  Patient presents with  . Annual Exam    HPI:  Courtney [name] is a 64 y.o. female who presents today for an annual wellness visit.   1) Health Maintenance -   Diet - Averages about 2 meals per day consisting of meat, vegetables, fruit, fish, some dairy; Caffeine intake is minimal  Exercise - Daily walking at work; goal is to increase physical activity.    2) Preventative Exams / Immunizations:  Dental -- Up to date  Vision -- Up to date   Health Maintenance  Topic Date Due  . Hepatitis C Screening  15-Jul-1950  . HIV Screening  01/23/1964  . TETANUS/TDAP  01/23/1968  . ZOSTAVAX  01/22/2009  . PNA vac Low Risk Adult (1 of 2 - PCV13) 01/22/2014  . MAMMOGRAM  01/27/2014  . INFLUENZA VACCINE  09/19/2015  . PAP SMEAR  05/19/2016  . COLONOSCOPY  02/23/2023  . DEXA SCAN  Completed     There is no immunization history on file for this patient.  Allergies  Allergen Reactions  . Erythromycin Hives  . Penicillins Hives  . Shellfish Allergy Hives     Outpatient Prescriptions Prior to Visit  Medication Sig Dispense Refill  . acetaminophen (TYLENOL) 500 MG tablet Take 1,000 mg by mouth every 6 (six) hours as needed (Pain).     [name] aspirin 81 MG tablet Take 81 mg by mouth daily.    . CRESTOR 40 MG tablet TAKE 1 TABLET BY MOUTH ONCE DAILY 90 tablet 1  . Diclofenac Sodium 2 % SOLN Apply 1 application topically 2 (two) times daily as needed. Apply 1 pump twice daily. 112 g 3  . famotidine (PEPCID AC) 10 MG chewable tablet Chew 10 mg by mouth as needed for heartburn.    . gabapentin (NEURONTIN) 100 MG capsule Take 2 capsules (200 mg total) by mouth at bedtime. 60 capsule 3  . methocarbamol (ROBAXIN) 500 MG tablet Take 1 tablet (500 mg total) by mouth every 6 (six) hours as needed for muscle spasms. 60 tablet 0  . metoprolol tartrate (LOPRESSOR) 25 MG tablet Take 25  mg by mouth daily.    . nitroGLYCERIN (NITROSTAT) 0.4 MG SL tablet Place 0.4 mg under the tongue every 5 (five) minutes as needed for chest pain.    . ramipril (ALTACE) 5 MG capsule TAKE 1 CAPSULE BY MOUTH ONCE DAILY 90 capsule 3  . Turmeric 500 MG CAPS Take 500 mg by mouth.     No facility-administered medications prior to visit.     Past Medical History  Diagnosis Date  . Carotid artery disease ([place])     40-59% bilateral, doppler December 2010  . Shellfish allergy   . PVC (premature ventricular contraction)     per prior Holter monitor  . Aortic valve sclerosis     echo [id number], soft systolic murmur  . Dyslipidemia   . Coronary artery disease     a. BMS-mid RCA 11/2007 b. stress echo 02/2009 subtle inferior HK, lateral ST depressions with stress c. follow-up cath 02/2009: RCA stent patent, severe but stable stenosis of distal posterior lateral branch RCA. LV normal, med tx unless more sx c. ETT Myoview 08/27/12: negative for ischemia; EF 64%  . Right hip pain 12/2009  . Ejection fraction     EF normal, catheterization, 2011 /  EF 55%, echo,  2010  . Fluid overload     Mild, stable since hospitalization in the past  . Depression     Mild, August, 2012  . Myocardial infarction ([place])     2009  . Hypertension   . Heart murmur     has, occasional PVC's  . GERD (gastroesophageal reflux disease)     occarionally, takes Pepcid over the counter  . Arthritis     in right hip  . Hyperlipidemia      Past Surgical History  Procedure Laterality Date  . Coronary angioplasty with stent placement  11/2007    BMS-mid RCA  . Cardiac catheterization  1/211    Patent RCA stent, stenotic PLB supplying small distribution; medically managed  . Diagnostic laparoscopy      1982 for endometriosis  . Hx of wisdom teeth surgery    . Dilation and curettage of uterus  2005  . 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 Heart attack Father 14  . Stroke Mother   . Heart attack Paternal Grandfather   . Heart disease Sister      Social History   Social History  . Marital Status: Divorced    Spouse Name: N/A  . Number of Children: 1  . Years of Education: 52   Occupational History  . Nursing Director    Social History Main Topics  . Smoking status: Never Smoker   . Smokeless tobacco: Never Used  . Alcohol Use: 4.2 oz/week    7 Glasses of wine per week     Comment: 1 glass of wine/night  . Drug Use: No  . Sexual Activity: Yes   Other Topics Concern  . Not on file   Social History Narrative   Fun: Dance, garden,    Denies religious beliefs effecting health care.   Denies abuse and feels safe at home    Review of Systems  Constitutional: Denies fever, chills, fatigue, or significant weight gain/loss. HENT: Head: Denies headache or neck pain Ears: Denies changes in hearing, ringing in ears, earache, drainage Nose: Denies discharge, stuffiness, itching, nosebleed, sinus pain Throat: Denies sore throat, hoarseness, dry mouth, sores, thrush Eyes: Denies loss/changes in vision, pain, redness, blurry/double vision, flashing lights Cardiovascular: Denies chest pain/discomfort, tightness, palpitations, shortness of breath with activity, difficulty lying down, swelling, sudden awakening with shortness of breath Respiratory: Denies shortness of breath, cough, sputum production, wheezing Gastrointestinal: Denies dysphasia, heartburn, change in appetite, nausea, change in bowel habits, rectal bleeding, constipation, diarrhea, yellow skin or eyes Genitourinary: Denies frequency, urgency, burning/pain, blood in urine, incontinence, change in urinary strength. Musculoskeletal: Denies muscle/joint pain, stiffness, back pain, redness or swelling of joints, trauma Skin:  Denies rashes, lumps, itching, dryness, color changes, or hair/nail changes Neurological: Denies dizziness, fainting, seizures, weakness, numbness, tingling, tremor Psychiatric - Denies nervousness, stress, depression or memory loss Endocrine: Denies heat or cold intolerance, sweating, frequent urination, excessive thirst, changes in appetite Hematologic: Denies ease of bruising or bleeding     Objective:    BP 128/80 mmHg  Pulse 55  Temp(Src) 98 F (36.7 C) (Oral)  Resp 18  Ht 5\' 1"  (1.549 m)  Wt 182 lb (82.555 kg)  BMI 34.41 kg/m2  SpO2 97% Nursing note and vital signs reviewed.  Depression screen [facility] 2/9  01/06/2015  Decreased Interest 0  Down, Depressed, Hopeless 0  PHQ - 2 Score 0   Physical Exam  Constitutional: She is oriented to person, place, and time. She appears well-developed and well-nourished.  HENT:  Head: Normocephalic.  Right Ear: Hearing, tympanic membrane, external ear and ear canal normal.  Left Ear: Hearing, tympanic membrane, external ear and ear canal normal.  Nose: Nose normal.  Mouth/Throat: Uvula is midline, oropharynx is clear and moist and mucous membranes are normal.  Eyes: Conjunctivae and EOM are normal. Pupils are equal, round, and reactive to light.  Neck: Neck supple. No JVD present. No tracheal deviation present. No thyromegaly present.  Cardiovascular: Normal rate, regular rhythm, normal heart sounds and intact distal pulses.   Pulmonary/Chest: Effort normal and breath sounds normal.  Abdominal: Soft. Bowel sounds are normal. She exhibits no distension and no mass. There is no tenderness. There is no rebound and no guarding.  Musculoskeletal: Normal range of motion. She exhibits no edema or tenderness.  Lymphadenopathy:    She has no cervical adenopathy.  Neurological: She is alert and oriented to person, place, and time. She has normal reflexes. No cranial nerve deficit. She exhibits normal muscle tone. Coordination normal.  Skin: Skin is  warm and dry.  Psychiatric: She has a normal mood and affect. Her behavior is normal. Judgment and thought content normal.       Assessment & Plan:   Problem List Items Addressed This Visit      Other   Routine general medical examination at a health care facility    1) Anticipatory Guidance: Discussed importance of wearing a seatbelt while driving and not texting while driving; changing batteries in smoke detector at least once annually; wearing suntan lotion when outside; eating a balanced and moderate diet; getting physical activity at least 30 minutes per day.  2) Immunizations / Screenings / Labs:  Prevnar updated today. Prescription for Zostavax provided. All other immunizations are up-to-date per recommendations. Due for a mammogram for which a referral has been placed. All other screenings are up-to-date per recommendations. Obtain CBC, BMET, IBC panel, ferritin, Lipid profile and TSH when fasting.   Overall well exam with minimal risk factors for cardiovascular disease with the exception of her pre-existing cardiac history. BMI indicates obesity. Encouraged increase physical activity and a diet that is moderate and varied emphasizing nutrient dense foods. Continue other healthy lifestyle behaviors and choices. Follow-up prevention exam in one year. Follow-up office visit pending blood work if necessary.      Relevant Orders   Comprehensive metabolic panel   CBC   TSH   Lipid panel   IBC panel   Ferritin    Other Visit Diagnoses    Encounter for screening mammogram for breast cancer    -  Primary    Relevant Orders    MM DIGITAL SCREENING BILATERAL

## 2015-01-06 NOTE — Assessment & Plan Note (Signed)
Decision today to treat with OMT was based on Physical Exam  After verbal consent patient was treated with HVLA, ME techniques in cervical, thoracic and rib areas  Patient tolerated the procedure well with improvement in symptoms  Patient given exercises, stretches and lifestyle modifications  See medications in patient instructions if given  Patient will follow up in  Weeks 3-4 weeks           

## 2015-01-06 NOTE — Patient Instructions (Signed)
Great to see you You look great with the hip still Stay active Lets try some more posture changes  On wall with heels, butt shoulder and head touching for a goal of 5 minutes daily  New exercises for the hip flexor 3 times a week Gabapentin 100mg  at night for first week then 200mg  thereafter Iron 65mg  daily or 3 times a week with 500mg  of vitamin C.  See me again in 3 weeks Happy holidays!

## 2015-01-06 NOTE — Assessment & Plan Note (Signed)
1) Anticipatory Guidance: Discussed importance of wearing a seatbelt while driving and not texting while driving; changing batteries in smoke detector at least once annually; wearing suntan lotion when outside; eating a balanced and moderate diet; getting physical activity at least 30 minutes per day.  2) Immunizations / Screenings / Labs:  Prevnar updated today. Prescription for Zostavax provided. All other immunizations are up-to-date per recommendations. Due for a mammogram for which a referral has been placed. All other screenings are up-to-date per recommendations. Obtain CBC, BMET, IBC panel, ferritin, Lipid profile and TSH when fasting.   Overall well exam with minimal risk factors for cardiovascular disease with the exception of her pre-existing cardiac history. BMI indicates obesity. Encouraged increase physical activity and a diet that is moderate and varied emphasizing nutrient dense foods. Continue other healthy lifestyle behaviors and choices. Follow-up prevention exam in one year. Follow-up office visit pending blood work if necessary.

## 2015-01-09 ENCOUNTER — Other Ambulatory Visit (INDEPENDENT_AMBULATORY_CARE_PROVIDER_SITE_OTHER): Payer: 59

## 2015-01-09 DIAGNOSIS — Z0000 Encounter for general adult medical examination without abnormal findings: Secondary | ICD-10-CM | POA: Diagnosis not present

## 2015-01-09 LAB — COMPREHENSIVE METABOLIC PANEL
ALT: 12 U/L (ref 0–35)
AST: 15 U/L (ref 0–37)
Albumin: 4.2 g/dL (ref 3.5–5.2)
Alkaline Phosphatase: 92 U/L (ref 39–117)
BUN: 16 mg/dL (ref 6–23)
CHLORIDE: 104 meq/L (ref 96–112)
CO2: 25 meq/L (ref 19–32)
Calcium: 9.7 mg/dL (ref 8.4–10.5)
Creatinine, Ser: 0.91 mg/dL (ref 0.40–1.20)
GFR: 65.75 mL/min (ref >60.00)
Glucose, Bld: 94 mg/dL (ref 70–99)
Potassium: 4.6 mEq/L (ref 3.5–5.1)
SODIUM: 139 meq/L (ref 135–145)
Total Bilirubin: 0.4 mg/dL (ref 0.2–1.2)
Total Protein: 7.1 g/dL (ref 6.0–8.3)

## 2015-01-09 LAB — LIPID PANEL
Cholesterol: 192 mg/dL (ref 0–200)
HDL: 54.7 mg/dL (ref >39.00)
LDL Cholesterol: 104 mg/dL — ABNORMAL HIGH (ref 0–99)
NONHDL: 137.68
Total CHOL/HDL Ratio: 4
Triglycerides: 166 mg/dL — ABNORMAL HIGH (ref 0.0–149.0)
VLDL: 33.2 mg/dL (ref 0.0–40.0)

## 2015-01-09 LAB — IBC PANEL
IRON: 62 ug/dL (ref 42–145)
Saturation Ratios: 15.2 % — ABNORMAL LOW (ref 20.0–50.0)
Transferrin: 292 mg/dL (ref 212.0–360.0)

## 2015-01-09 LAB — FERRITIN: Ferritin: 26.6 ng/mL (ref 10.0–291.0)

## 2015-01-09 LAB — TSH: TSH: 1.43 u[IU]/mL (ref 0.35–4.50)

## 2015-01-10 LAB — CBC
HCT: 35 % — ABNORMAL LOW (ref 36.0–46.0)
HEMOGLOBIN: 11.6 g/dL — AB (ref 12.0–15.0)
MCHC: 33.1 g/dL (ref 30.0–36.0)
MCV: 91.7 fl (ref 78.0–100.0)
Platelets: 243 10*3/uL (ref 150.0–400.0)
RBC: 3.82 Mil/uL — AB (ref 3.87–5.11)
RDW: 13.6 % (ref 11.5–15.5)
WBC: 8.1 10*3/uL (ref 4.0–10.5)

## 2015-01-15 ENCOUNTER — Encounter: Payer: Self-pay | Admitting: Family

## 2015-01-26 ENCOUNTER — Other Ambulatory Visit: Payer: Self-pay | Admitting: Cardiology

## 2015-01-26 MED ORDER — ROSUVASTATIN CALCIUM 40 MG PO TABS: 40.0000 mg | ORAL_TABLET | Freq: Every day | ORAL | Status: DC

## 2015-01-26 NOTE — Telephone Encounter (Signed)
Rx(s) sent to pharmacy electronically.  

## 2015-01-26 NOTE — Telephone Encounter (Signed)
°*  STAT* If patient is at the pharmacy, call can be transferred to refill team.   1. Which medications need to be refilled? (please list name of each medication and dose if known) Generic Crestor-first time getting generic-former pt of Dr Daryel November  2. Which pharmacy/location (including street and city if local pharmacy) is medication to be sent to?[facility] [phone]  3. Do they need a 30 day or 90 day supply? 90 and refills

## 2015-02-20 DIAGNOSIS — M62830 Muscle spasm of back: Secondary | ICD-10-CM | POA: Diagnosis not present

## 2015-02-20 DIAGNOSIS — M9903 Segmental and somatic dysfunction of lumbar region: Secondary | ICD-10-CM | POA: Diagnosis not present

## 2015-02-21 DIAGNOSIS — M9903 Segmental and somatic dysfunction of lumbar region: Secondary | ICD-10-CM | POA: Diagnosis not present

## 2015-02-21 DIAGNOSIS — M62830 Muscle spasm of back: Secondary | ICD-10-CM | POA: Diagnosis not present

## 2015-02-23 DIAGNOSIS — M62830 Muscle spasm of back: Secondary | ICD-10-CM | POA: Diagnosis not present

## 2015-02-23 DIAGNOSIS — M9903 Segmental and somatic dysfunction of lumbar region: Secondary | ICD-10-CM | POA: Diagnosis not present

## 2015-03-06 ENCOUNTER — Ambulatory Visit
Admission: RE | Admit: 2015-03-06 | Discharge: 2015-03-06 | Disposition: A | Discharge status: Home / Self Care | Payer: 59 | Source: Ambulatory Visit | Attending: Family | Admitting: Family

## 2015-03-06 DIAGNOSIS — M9903 Segmental and somatic dysfunction of lumbar region: Secondary | ICD-10-CM | POA: Diagnosis not present

## 2015-03-06 DIAGNOSIS — Z1231 Encounter for screening mammogram for malignant neoplasm of breast: Secondary | ICD-10-CM | POA: Diagnosis not present

## 2015-03-06 DIAGNOSIS — M62830 Muscle spasm of back: Secondary | ICD-10-CM | POA: Diagnosis not present

## 2015-03-07 ENCOUNTER — Encounter: Payer: Self-pay | Admitting: Family

## 2015-03-09 DIAGNOSIS — M62830 Muscle spasm of back: Secondary | ICD-10-CM | POA: Diagnosis not present

## 2015-03-09 DIAGNOSIS — M9903 Segmental and somatic dysfunction of lumbar region: Secondary | ICD-10-CM | POA: Diagnosis not present

## 2015-03-13 DIAGNOSIS — M9903 Segmental and somatic dysfunction of lumbar region: Secondary | ICD-10-CM | POA: Diagnosis not present

## 2015-03-13 DIAGNOSIS — M62830 Muscle spasm of back: Secondary | ICD-10-CM | POA: Diagnosis not present

## 2015-03-14 DIAGNOSIS — H5203 Hypermetropia, bilateral: Secondary | ICD-10-CM | POA: Diagnosis not present

## 2015-03-14 DIAGNOSIS — M9903 Segmental and somatic dysfunction of lumbar region: Secondary | ICD-10-CM | POA: Diagnosis not present

## 2015-03-14 DIAGNOSIS — H2513 Age-related nuclear cataract, bilateral: Secondary | ICD-10-CM | POA: Diagnosis not present

## 2015-03-14 DIAGNOSIS — M62830 Muscle spasm of back: Secondary | ICD-10-CM | POA: Diagnosis not present

## 2015-03-14 DIAGNOSIS — H52223 Regular astigmatism, bilateral: Secondary | ICD-10-CM | POA: Diagnosis not present

## 2015-04-19 DIAGNOSIS — N760 Acute vaginitis: Secondary | ICD-10-CM | POA: Diagnosis not present

## 2015-04-29 ENCOUNTER — Encounter (HOSPITAL_COMMUNITY): Payer: Self-pay | Admitting: Nurse Practitioner

## 2015-04-29 ENCOUNTER — Emergency Department (HOSPITAL_COMMUNITY)
Admission: EM | Admit: 2015-04-29 | Discharge: 2015-04-29 | Disposition: A | Discharge status: Home / Self Care | Payer: Commercial Managed Care - HMO | Source: Home / Self Care

## 2015-04-29 DIAGNOSIS — R05 Cough: Secondary | ICD-10-CM | POA: Diagnosis not present

## 2015-04-29 DIAGNOSIS — J209 Acute bronchitis, unspecified: Secondary | ICD-10-CM

## 2015-04-29 DIAGNOSIS — J45909 Unspecified asthma, uncomplicated: Secondary | ICD-10-CM

## 2015-04-29 DIAGNOSIS — R059 Cough, unspecified: Secondary | ICD-10-CM

## 2015-04-29 MED ORDER — METHYLPREDNISOLONE 4 MG PO TBPK: ORAL_TABLET | ORAL | Status: DC

## 2015-04-29 MED ORDER — DOXYCYCLINE HYCLATE 100 MG PO CAPS: 100.0000 mg | ORAL_CAPSULE | Freq: Two times a day (BID) | ORAL | Status: DC

## 2015-04-29 MED ORDER — PROMETHAZINE-DM 6.25-15 MG/5ML PO SYRP: 5.0000 mL | ORAL_SOLUTION | Freq: Four times a day (QID) | ORAL | Status: DC | PRN

## 2015-04-29 NOTE — ED Notes (Signed)
Per  pts     Request    rx   Phoned       To  [facility]   On [address]

## 2015-04-29 NOTE — Discharge Instructions (Signed)

## 2015-04-29 NOTE — ED Notes (Signed)
Pt c/o nonproductive cough, nasal congestion, wheezing since Wednesday. She denies fevers, n/v, cp.

## 2015-04-29 NOTE — ED Provider Notes (Signed)
CSN: [id number]     Arrival date & time 04/29/15  1308 History   None    No chief complaint on file.  (Consider location/radiation/quality/duration/timing/severity/associated sxs/prior Treatment) Patient is a 65 y.o. female presenting with URI. The history is provided by the patient.  URI Presenting symptoms: congestion, cough, fatigue and rhinorrhea   Severity:  Moderate Onset quality:  Gradual Duration:  1 week Timing:  Constant Progression:  Worsening Chronicity:  Recurrent Relieved by:  OTC medications Worsened by:  Nothing tried Ineffective treatments:  None tried Associated symptoms: arthralgias and wheezing     Past Medical History  Diagnosis Date  . Carotid artery disease ([place])     40-59% bilateral, doppler December 2010  . Shellfish allergy   . PVC (premature ventricular contraction)     per prior Holter monitor  . Aortic valve sclerosis     echo 123456, soft systolic murmur  . Dyslipidemia   . Coronary artery disease     a. BMS-mid RCA 11/2007 b. stress echo 02/2009 subtle inferior HK, lateral ST depressions with stress c. follow-up cath 02/2009: RCA stent patent, severe but stable stenosis of distal posterior lateral branch RCA. LV normal, med tx unless more sx c. ETT Myoview 08/27/12: negative for ischemia; EF 64%  . Right hip pain 12/2009  . Ejection fraction     EF normal, catheterization, 2011 /  EF 55%, echo, 2010  . Fluid overload     Mild, stable since hospitalization in the past  . Depression     Mild, August, 2012  . Myocardial infarction ([place])     2009  . Hypertension   . Heart murmur     has, occasional PVC's  . GERD (gastroesophageal reflux disease)     occarionally, takes Pepcid over the counter  . Arthritis     in right hip  . Hyperlipidemia    Past Surgical History  Procedure Laterality Date  . Coronary angioplasty with stent placement  11/2007    BMS-mid RCA  . Cardiac catheterization  1/211    Patent RCA stent, stenotic PLB supplying  small distribution; medically managed  . Diagnostic laparoscopy      1982 for endometriosis  . Hx of wisdom teeth surgery    . Dilation and curettage of uterus  2005  . Total hip arthroplasty Right 07/08/2014    Procedure: RIGHT TOTAL HIP ARTHROPLASTY ANTERIOR APPROACH;  Surgeon: Mcarthur Rossetti, MD;  Location: [facility];  Service: Orthopedics;  Laterality: Right;   Family History  Problem Relation Age of Onset  . Heart attack Father 83  . Stroke Mother   . Heart attack Paternal Grandfather   . Heart disease Sister    Social History  Substance Use Topics  . Smoking status: Never Smoker   . Smokeless tobacco: Never Used  . Alcohol Use: 4.2 oz/week    7 Glasses of wine per week     Comment: 1 glass of wine/night   OB History    No data available     Review of Systems  Constitutional: Positive for fatigue.  HENT: Positive for congestion and rhinorrhea.   Respiratory: Positive for cough and wheezing.   Musculoskeletal: Positive for arthralgias.    Allergies  Erythromycin; Penicillins; and Shellfish allergy  Home Medications   Prior to Admission medications   Medication Sig Start Date End Date Taking? Authorizing Provider  acetaminophen (TYLENOL) 500 MG tablet Take 1,000 mg by mouth every 6 (six) hours as needed (Pain).  Historical Provider, MD  aspirin 81 MG tablet Take 81 mg by mouth daily.    Historical Provider, MD  Diclofenac Sodium 2 % SOLN Apply 1 application topically 2 (two) times daily as needed. Apply 1 pump twice daily. 01/06/15   Lyndal Pulley, DO  famotidine (PEPCID AC) 10 MG chewable tablet Chew 10 mg by mouth as needed for heartburn.    Historical Provider, MD  gabapentin (NEURONTIN) 100 MG capsule Take 2 capsules (200 mg total) by mouth at bedtime. 01/06/15   Lyndal Pulley, DO  methocarbamol (ROBAXIN) 500 MG tablet Take 1 tablet (500 mg total) by mouth every 6 (six) hours as needed for muscle spasms. 07/09/14   Mcarthur Rossetti, MD  metoprolol  tartrate (LOPRESSOR) 25 MG tablet Take 25 mg by mouth daily.    Historical Provider, MD  nitroGLYCERIN (NITROSTAT) 0.4 MG SL tablet Place 0.4 mg under the tongue every 5 (five) minutes as needed for chest pain.    Historical Provider, MD  omeprazole (PRILOSEC) 40 MG capsule Take 1 capsule (40 mg total) by mouth daily. 01/06/15   Golden Circle, FNP  ramipril (ALTACE) 5 MG capsule TAKE 1 CAPSULE BY MOUTH ONCE DAILY 06/17/14   Carlena Bjornstad, MD  rosuvastatin (CRESTOR) 40 MG tablet Take 1 tablet (40 mg total) by mouth daily. 01/26/15   Peter M Martinique, MD  Turmeric 500 MG CAPS Take 500 mg by mouth.    Historical Provider, MD  zoster vaccine live, PF, (ZOSTAVAX) 09811 UNT/0.65ML injection Inject 19,400 Units into the skin once. 01/06/15   Golden Circle, FNP   Meds Ordered and Administered this Visit  Medications - No data to display  There were no vitals taken for this visit. No data found.   Physical Exam  Constitutional: She appears well-developed and well-nourished.  HENT:  Head: Normocephalic and atraumatic.  Right Ear: External ear normal.  Left Ear: External ear normal.  Mouth/Throat: Oropharynx is clear and moist.  Eyes: Conjunctivae are normal. Pupils are equal, round, and reactive to light.  Neck: Normal range of motion. Neck supple.  Cardiovascular: Normal rate, regular rhythm and normal heart sounds.   Pulmonary/Chest: Effort normal. She has wheezes.  Abdominal: Soft. Bowel sounds are normal.    ED Course  Procedures (including critical care time)  Labs Review Labs Reviewed - No data to display  Imaging Review No results found.   Visual Acuity Review  Right Eye Distance:   Left Eye Distance:   Bilateral Distance:    Right Eye Near:   Left Eye Near:    Bilateral Near:         MDM  Bronchitis  Doxycycline 143mtg one po bid x 10 days  Medrol dose pack as directed Promethazine DM 85ml po q 6hours prn #134ml Push po fluids, rest, tylenol and motrin otc  prn as directed for fever, arthralgias, and myalgias.  Follow up prn if sx's continue or persist.    Lysbeth Penner, FNP 04/29/15 1454

## 2015-06-06 DIAGNOSIS — Z113 Encounter for screening for infections with a predominantly sexual mode of transmission: Secondary | ICD-10-CM | POA: Diagnosis not present

## 2015-06-06 DIAGNOSIS — Z124 Encounter for screening for malignant neoplasm of cervix: Secondary | ICD-10-CM | POA: Diagnosis not present

## 2015-06-26 DIAGNOSIS — M8580 Other specified disorders of bone density and structure, unspecified site: Secondary | ICD-10-CM | POA: Diagnosis not present

## 2015-06-26 DIAGNOSIS — Z13820 Encounter for screening for osteoporosis: Secondary | ICD-10-CM | POA: Diagnosis not present

## 2015-06-27 DIAGNOSIS — M8580 Other specified disorders of bone density and structure, unspecified site: Secondary | ICD-10-CM | POA: Insufficient documentation

## 2015-06-27 DIAGNOSIS — E559 Vitamin D deficiency, unspecified: Secondary | ICD-10-CM | POA: Diagnosis not present

## 2015-07-11 ENCOUNTER — Other Ambulatory Visit: Payer: Self-pay | Admitting: Cardiology

## 2015-07-13 NOTE — Telephone Encounter (Signed)
Pt has appt w/ Dr Martinique scheduled for 07/31/15 @ 230. Thanks.

## 2015-07-19 ENCOUNTER — Encounter: Payer: Self-pay | Admitting: Family

## 2015-07-19 ENCOUNTER — Telehealth: Payer: Commercial Managed Care - HMO | Admitting: Nurse Practitioner

## 2015-07-19 DIAGNOSIS — Z96641 Presence of right artificial hip joint: Secondary | ICD-10-CM | POA: Diagnosis not present

## 2015-07-19 DIAGNOSIS — M25551 Pain in right hip: Secondary | ICD-10-CM | POA: Diagnosis not present

## 2015-07-19 DIAGNOSIS — M1611 Unilateral primary osteoarthritis, right hip: Secondary | ICD-10-CM | POA: Diagnosis not present

## 2015-07-19 DIAGNOSIS — T63481A Toxic effect of venom of other arthropod, accidental (unintentional), initial encounter: Secondary | ICD-10-CM

## 2015-07-19 DIAGNOSIS — T63891A Toxic effect of contact with other venomous animals, accidental (unintentional), initial encounter: Secondary | ICD-10-CM

## 2015-07-19 DIAGNOSIS — Z96649 Presence of unspecified artificial hip joint: Secondary | ICD-10-CM

## 2015-07-19 MED ORDER — DOXYCYCLINE HYCLATE 100 MG PO CAPS: 100.0000 mg | ORAL_CAPSULE | Freq: Two times a day (BID) | ORAL | Status: DC

## 2015-07-19 NOTE — Progress Notes (Signed)
E Visit for Insect Sting  Thank you for describing the insect sting for [place].  Here is how we plan to help!  Based on the information you have shared with me it looks like you have:  The 2 greatest risks from insect stings are allergic reaction, which can be fatal in some people and infection, which is more common and less serious.  Bees, wasps, yellow jackets, and hornets belong to a class of insects called Hymenoptera.  Most insect stings cause only minor discomfort.  Stings can happen anywhere on the body and can be painful.  Most stings are from honey bees or yellow jackets.  Fire ants can sting multiple times.  The sites of the stings are more likely to become infected.    Your symptoms indicate that you need a longer course of antibiotics and a follow up visit with a provider.  I have sent prednisone 10 mg tapering dose for 5 days to the pharmacy that you selected.  You will need to schedule a follow up visit with your provider.  If you do not have a primary care provider you may use our telehealth physicians on the web at [place].  What can be used to prevent Insect Stings?   Insect repellant with at least 20% DEET.    Wearing long pants and shirts with socks and shoes.    Wear dark or drab-colored clothes rather than bright colors.    Avoid using perfumes and hair sprays; these attract insects.  HOME CARE ADVICE:  1. Stinger removal:  The stinger looks like a tiny black dot in the sting.  Use a fingernail, credit card edge, or knife-edge to scrape it off.  Don't pull it out because it squeezes out more venom.  If the stinger is below the skin surface, leave it alone.  It will be shed with normal skin healing. 2. Use cold compresses to the area of the sting for 10-20 minutes.  You may repeat this as needed to relieve symptoms of pain and swelling. 3.  For pain relief, take acetominophen 650 mg 4-6 hours as needed or ibuprofen 400 mg every 6-8 hours as needed or  naproxen 250-500 mg every 12 hours as needed. 4.  You can also use hydrocortisone cream 0.5% or 1% up to 4 times daily as needed for itching. 5.  If the sting becomes very itchy, take Benadryl 25-50 mg, follow directions on box. 6.  Wash the area 2-3 times daily with antibacterial soap and warm water. 7. Call your Doctor if:  Fever, a severe headache, or rash occur in the next 2 weeks.  Sting area begins to look infected.  Redness and swelling worsens after home treatment.  Your current symptoms become worse.    MAKE SURE YOU:   Understand these instructions.  Will watch your condition.  Will get help right away if you are not doing well or get worse.  Thank you for choosing an e-visit. Your e-visit answers were reviewed by a board certified advanced clinical practitioner to complete your personal care plan. Depending upon the condition, your plan could have included both over the counter or prescription medications. Please review your pharmacy choice. Be sure that the pharmacy you have chosen is open so that you can pick up your prescription now.  If there is a problem you may message your provider in [place] to have the prescription routed to another pharmacy. Your safety is important to [place]. If you have drug allergies check your  prescription carefully.  For the next 24 hours, you can use MyChart to ask questions about today's visit, request a non-urgent call back, or ask for a work or school excuse from your e-visit provider. You will get an email in the next two days asking about your experience. I hope that your e-visit has been valuable and will speed your recovery.

## 2015-07-28 ENCOUNTER — Other Ambulatory Visit: Payer: Self-pay | Admitting: *Deleted

## 2015-07-31 ENCOUNTER — Other Ambulatory Visit: Payer: Self-pay | Admitting: Cardiology

## 2015-07-31 ENCOUNTER — Encounter: Payer: Self-pay | Admitting: Cardiology

## 2015-07-31 ENCOUNTER — Other Ambulatory Visit: Payer: Self-pay

## 2015-07-31 ENCOUNTER — Ambulatory Visit (INDEPENDENT_AMBULATORY_CARE_PROVIDER_SITE_OTHER): Payer: Commercial Managed Care - HMO | Admitting: Cardiology

## 2015-07-31 VITALS — BP 108/78 | HR 49 | Ht 64.0 in | Wt 171.0 lb

## 2015-07-31 DIAGNOSIS — E785 Hyperlipidemia, unspecified: Secondary | ICD-10-CM

## 2015-07-31 DIAGNOSIS — I2510 Atherosclerotic heart disease of native coronary artery without angina pectoris: Secondary | ICD-10-CM

## 2015-07-31 DIAGNOSIS — I358 Other nonrheumatic aortic valve disorders: Secondary | ICD-10-CM

## 2015-07-31 MED ORDER — METOPROLOL TARTRATE 25 MG PO TABS: 12.5000 mg | ORAL_TABLET | Freq: Two times a day (BID) | ORAL | Status: DC

## 2015-07-31 MED ORDER — RAMIPRIL 5 MG PO CAPS: ORAL_CAPSULE | ORAL | Status: DC

## 2015-07-31 MED ORDER — NITROGLYCERIN 0.4 MG SL SUBL: 0.4000 mg | SUBLINGUAL_TABLET | SUBLINGUAL | Status: DC | PRN

## 2015-07-31 MED ORDER — OMEPRAZOLE 40 MG PO CPDR: 40.0000 mg | DELAYED_RELEASE_CAPSULE | Freq: Every day | ORAL | Status: DC

## 2015-07-31 MED ORDER — ROSUVASTATIN CALCIUM 40 MG PO TABS: 40.0000 mg | ORAL_TABLET | Freq: Every day | ORAL | Status: DC

## 2015-07-31 NOTE — Patient Instructions (Signed)
Continue your current therapy  I will see you in one year   

## 2015-07-31 NOTE — Telephone Encounter (Signed)
Rx(s) sent to pharmacy electronically.  

## 2015-07-31 NOTE — Progress Notes (Signed)
Cardiology Office Note    Date:  07/31/2015   ID:  Courtney [name], DOB Sep 10, 1950, MRN [id number]  PCP:  Mauricio Po, FNP  Cardiologist:  Dulse Rutan [place], MD    History of Present Illness:  Courtney [name] is a 65 y.o. female seen to establish cardiac care. She is a former patient of Dr. Ron Parker. She has a history of CAD. Presented in 2009 and had a BMS of the mid RCA. She states she had 2 stents. In 2011 she had a mildly abnormal stress test and repeat cardiac cath showed nonobstructive CAD. In 2014 she had a normal Myoview study. She also has a history of mild carotid disease, HTN, PVCs, and obesity. She is retired as a Retail buyer for [employer].   On follow up today she reports she is feeling well. She has joined [employer] and has lost 12 lbs. She denies any chest pain, SOB, palpitations, or edema. She enjoys ballroom dancing and doing yard work. She did have some leg pain following hip surgery last year and LE arterial dopplers were negative.      Past Medical History  Diagnosis Date  . Carotid artery disease ([place])     40-59% bilateral, doppler December 2010  . Shellfish allergy   . PVC (premature ventricular contraction)     per prior Holter monitor  . Aortic valve sclerosis     echo 123456, soft systolic murmur  . Dyslipidemia   . Coronary artery disease     a. BMS-mid RCA 11/2007 b. stress echo 02/2009 subtle inferior HK, lateral ST depressions with stress c. follow-up cath 02/2009: RCA stent patent, severe but stable stenosis of distal posterior lateral branch RCA. LV normal, med tx unless more sx c. ETT Myoview 08/27/12: negative for ischemia; EF 64%  . Right hip pain 12/2009  . Ejection fraction     EF normal, catheterization, 2011 /  EF 55%, echo, 2010  . Fluid overload     Mild, stable since hospitalization in the past  . Depression     Mild, August, 2012  . Myocardial infarction ([place])     2009  . Hypertension   . Heart murmur     has, occasional  PVC's  . GERD (gastroesophageal reflux disease)     occarionally, takes Pepcid over the counter  . Arthritis     in right hip  . Hyperlipidemia     Past Surgical History  Procedure Laterality Date  . Coronary angioplasty with stent placement  11/2007    BMS-mid RCA  . Cardiac catheterization  1/211    Patent RCA stent, stenotic PLB supplying small distribution; medically managed  . Diagnostic laparoscopy      1982 for endometriosis  . Hx of wisdom teeth surgery    . Dilation and curettage of uterus  2005  . 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 acetaminophen (TYLENOL) 500 MG tablet Take 1,000 mg by mouth every 6 (six) hours as needed (Pain).     [name] aspirin 81 MG tablet Take 81 mg by mouth daily.    . Diclofenac Sodium 2 % SOLN Apply 1 application topically 2 (two) times daily as needed. Apply 1 pump twice daily. 112 g 3  . gabapentin (NEURONTIN)  100 MG capsule Take 2 capsules (200 mg total) by mouth at bedtime. (Patient taking differently: Take 200 mg by mouth as needed. ) 60 capsule 3  . methocarbamol (ROBAXIN) 500 MG tablet Take 1 tablet (500 mg total) by mouth every 6 (six) hours as needed for muscle spasms. (Patient taking differently: Take 500 mg by mouth as needed for muscle spasms. ) 60 tablet 0  . metoprolol tartrate (LOPRESSOR) 25 MG tablet Take 0.5 tablets (12.5 mg total) by mouth 2 (two) times daily. (Patient taking differently: Take 25 mg by mouth daily. ) 90 tablet 3  . omeprazole (PRILOSEC) 40 MG capsule Take 1 capsule (40 mg total) by mouth daily. 90 capsule 1  . promethazine-dextromethorphan (PROMETHAZINE-DM) 6.25-15 MG/5ML syrup Take 5 mLs by mouth 4 (four) times daily as needed for cough. 118 mL 0  . ramipril  (ALTACE) 5 MG capsule TAKE 1 CAPSULE BY MOUTH EVERY DAY 90 capsule 3  . rosuvastatin (CRESTOR) 40 MG tablet Take 1 tablet (40 mg total) by mouth daily. 90 tablet 3  . Turmeric 500 MG CAPS Take 500 mg by mouth.    . nitroGLYCERIN (NITROSTAT) 0.4 MG SL tablet Place 0.4 mg under the tongue every 5 (five) minutes as needed for chest pain.    [name] doxycycline (VIBRAMYCIN) 100 MG capsule Take 1 capsule (100 mg total) by mouth 2 (two) times daily. (Patient not taking: Reported on 07/31/2015) 20 capsule 0  . famotidine (PEPCID AC) 10 MG chewable tablet Chew 10 mg by mouth as needed for heartburn. Reported on 07/31/2015    . methylPREDNISolone (MEDROL DOSEPAK) 4 MG TBPK tablet Use as directed (Patient not taking: Reported on 07/31/2015) 21 tablet 0  . metoprolol tartrate (LOPRESSOR) 25 MG tablet TAKE 1/2 TABLET BY MOUTH TWICE DAILY 30 tablet 0  . omeprazole (PRILOSEC) 40 MG capsule Take 1 capsule (40 mg total) by mouth daily. 90 capsule 3  . ramipril (ALTACE) 5 MG capsule TAKE 1 CAPSULE BY MOUTH EVERY DAY 30 capsule 0  . rosuvastatin (CRESTOR) 40 MG tablet Take 1 tablet (40 mg total) by mouth daily. 90 tablet 1  . zoster vaccine live, PF, (ZOSTAVAX) 09811 UNT/0.65ML injection Inject 19,400 Units into the skin once. (Patient not taking: Reported on 07/31/2015) 1 each 0   No facility-administered medications prior to visit.     Allergies:   Erythromycin; Penicillins; and Shellfish allergy   Social History   Social History  . Marital Status: Divorced    Spouse Name: N/A  . Number of Children: 1  . Years of Education: 73   Occupational History  . Nursing Director    Social History Main Topics  . Smoking status: Never Smoker   . Smokeless tobacco: Never Used  . Alcohol Use: 4.2 oz/week    7 Glasses of wine per week     Comment: 1 glass of wine/night  . Drug Use: No  . Sexual Activity: Yes   Other Topics Concern  . None   Social History Narrative   Fun: Dance, garden,    Denies religious beliefs  effecting health care.   Denies abuse and feels safe at home     Family History:  The patient's family history includes Heart attack in her paternal grandfather; Heart attack (age of onset: 11) in her father; Heart disease in her sister; Stroke in her mother.   ROS:   Please see the history of present illness.    ROS All other systems reviewed and are negative.  PHYSICAL EXAM:   VS:  BP 108/78 mmHg  Pulse 49  Ht 5\' 4"  (1.626 m)  Wt 171 lb (77.565 kg)  BMI 29.34 kg/m2   GEN: Well nourished, obese, in no acute distress HEENT: normal Neck: no JVD, carotid bruits, or masses Cardiac: RRR; soft gr 2/6 systolic murmur RUSB. no rubs, or gallops,no edema  Respiratory:  clear to auscultation bilaterally, normal work of breathing GI: soft, nontender, nondistended, + BS MS: no deformity or atrophy Skin: warm and dry, no rash Neuro:  Alert and Oriented x 3, Strength and sensation are intact Psych: euthymic mood, full affect  Wt Readings from Last 3 Encounters:  07/31/15 171 lb (77.565 kg)  01/06/15 182 lb (82.555 kg)  01/06/15 182 lb (82.555 kg)      Studies/Labs Reviewed:   EKG:  EKG is ordered today.  The ekg ordered today demonstrates NSR with normal Ecg. I have personally reviewed and interpreted this study.   Recent Labs: 01/09/2015: ALT 12; BUN 16; Creatinine, Ser 0.91; Hemoglobin 11.6*; Platelets 243.0; Potassium 4.6; Sodium 139; TSH 1.43   Lipid Panel    Component Value Date/Time   CHOL 192 01/09/2015 0806   TRIG 166.0* 01/09/2015 0806   HDL 54.70 01/09/2015 0806   CHOLHDL 4 01/09/2015 0806   VLDL 33.2 01/09/2015 0806   LDLCALC 104* 01/09/2015 0806    Additional studies/ records that were reviewed today include:  none    ASSESSMENT:    1. Aortic valve sclerosis   2. Coronary artery disease involving native coronary artery of native heart without angina pectoris   3. Hyperlipidemia      PLAN:  In order of problems listed above:  1. Continue current  medical therapy with ASA, statin, metoprolol and ramipril. Encourage weight loss. Follow up carotid dopplers are not indicated since last study showed <39% obstruction. Follow up in one year.    Medication Adjustments/Labs and Tests Ordered: Current medicines are reviewed at length with the patient today.  Concerns regarding medicines are outlined above.  Medication changes, Labs and Tests ordered today are listed in the Patient Instructions below. Patient Instructions  Continue your current therapy  I will see you in one year      Signed, Christionna Poland Martin[place], MD  07/31/2015 7:37 PM    [facility] [address], [place], [place], [address] [phone]

## 2015-08-01 ENCOUNTER — Other Ambulatory Visit: Payer: Self-pay | Admitting: *Deleted

## 2015-08-01 DIAGNOSIS — I2510 Atherosclerotic heart disease of native coronary artery without angina pectoris: Secondary | ICD-10-CM

## 2015-08-01 MED ORDER — RAMIPRIL 5 MG PO CAPS: ORAL_CAPSULE | ORAL | Status: DC

## 2015-08-01 MED ORDER — NITROGLYCERIN 0.4 MG SL SUBL: 0.4000 mg | SUBLINGUAL_TABLET | SUBLINGUAL | Status: DC | PRN

## 2015-08-01 MED ORDER — ROSUVASTATIN CALCIUM 40 MG PO TABS: 40.0000 mg | ORAL_TABLET | Freq: Every day | ORAL | Status: DC

## 2015-08-01 MED ORDER — METOPROLOL TARTRATE 25 MG PO TABS: 12.5000 mg | ORAL_TABLET | Freq: Two times a day (BID) | ORAL | Status: DC

## 2015-08-03 ENCOUNTER — Encounter: Payer: Self-pay | Admitting: Family

## 2015-08-03 DIAGNOSIS — K625 Hemorrhage of anus and rectum: Secondary | ICD-10-CM

## 2015-08-11 DIAGNOSIS — K625 Hemorrhage of anus and rectum: Secondary | ICD-10-CM | POA: Diagnosis not present

## 2015-08-15 ENCOUNTER — Other Ambulatory Visit: Payer: Self-pay | Admitting: *Deleted

## 2015-08-15 MED ORDER — OMEPRAZOLE 40 MG PO CPDR: 40.0000 mg | DELAYED_RELEASE_CAPSULE | Freq: Every day | ORAL | Status: DC

## 2015-10-24 ENCOUNTER — Encounter: Payer: Self-pay | Admitting: Family

## 2015-10-25 DIAGNOSIS — H5210 Myopia, unspecified eye: Secondary | ICD-10-CM | POA: Diagnosis not present

## 2015-10-25 DIAGNOSIS — H524 Presbyopia: Secondary | ICD-10-CM | POA: Diagnosis not present

## 2015-11-29 ENCOUNTER — Ambulatory Visit (INDEPENDENT_AMBULATORY_CARE_PROVIDER_SITE_OTHER): Payer: Commercial Managed Care - HMO | Admitting: Family

## 2015-11-29 ENCOUNTER — Encounter: Payer: Self-pay | Admitting: Family

## 2015-11-29 VITALS — BP 128/84 | HR 54 | Temp 97.7°F | Resp 16 | Ht 64.0 in | Wt 175.0 lb

## 2015-11-29 DIAGNOSIS — I779 Disorder of arteries and arterioles, unspecified: Secondary | ICD-10-CM

## 2015-11-29 DIAGNOSIS — I739 Peripheral vascular disease, unspecified: Secondary | ICD-10-CM

## 2015-11-29 DIAGNOSIS — Z0000 Encounter for general adult medical examination without abnormal findings: Secondary | ICD-10-CM

## 2015-11-29 DIAGNOSIS — Z23 Encounter for immunization: Secondary | ICD-10-CM | POA: Diagnosis not present

## 2015-11-29 NOTE — Assessment & Plan Note (Signed)
Reviewed and updated patient's medical, surgical, family and social history. Medications and allergies were also reviewed. Basic screenings for depression, activities of daily living, hearing, cognition and safety were performed. Provider list was updated and health plan was provided to the patient.  

## 2015-11-29 NOTE — Assessment & Plan Note (Signed)
1) Anticipatory Guidance: Discussed importance of wearing a seatbelt while driving and not texting while driving; changing batteries in smoke detector at least once annually; wearing suntan lotion when outside; eating a balanced and moderate diet; getting physical activity at least 30 minutes per day.  2) Immunizations / Screenings / Labs:  Tetanus and Pneumovax updated today. All other immunizations are up-to-date per recommendations. All screenings are up-to-date per recommendations. Obtain CBC, CMET, and lipid profile.    Overall well exam with risk factors for cardiovascular disease including hyperlipidemia and obesity with a BMI of 30. Recommend weight loss of 510% of current body weight through nutrition and physical activity changes. Continue other healthy lifestyle behaviors and choices. Follow-up prevention exam in 1 year. Follow-up office visit pending blood work and for chronic conditions.

## 2015-11-29 NOTE — Progress Notes (Signed)
Subjective:    Patient ID: Courtney [name], female    DOB: June 12, 1950, 65 y.o.   MRN: [id number]  Chief Complaint  Patient presents with  . CPE    not fasting    HPI:  Courtney [name] is a 65 y.o. female who presents today for a Medicare Annual Wellness/Physical exam.    1) Health Maintenance -   Diet - Averages about 3 meals per day consisting of a regular diet; Caffeine intake of about 2-3 cups per day; Previous enrolled in weight watchers.   Exercise - Dances 2x per month; walks occasionally.   2) Preventative Exams / Immunizations:  Dental -- Up to date  Vision -- Up to date    Health Maintenance  Topic Date Due  . TETANUS/TDAP  01/23/1968  . PNA vac Low Risk Adult (2 of 2 - PPSV23) 01/06/2016  . PAP SMEAR  06/05/2016  . MAMMOGRAM  03/05/2017  . COLONOSCOPY  02/23/2023  . INFLUENZA VACCINE  Completed  . DEXA SCAN  Completed  . ZOSTAVAX  Completed  . Hepatitis C Screening  Completed     Immunization History  Administered Date(s) Administered  . Pneumococcal Conjugate-13 01/06/2015  . Pneumococcal Polysaccharide-23 11/29/2015  . Tdap 11/29/2015    RISK FACTORS  Tobacco History  Smoking Status  . Never Smoker  Smokeless Tobacco  . Never Used     Cardiac risk factors: advanced age (older than 27 for men, 42 for women), dyslipidemia and obesity (BMI >= 30 kg/m2).  Depression Screen  Depression screen [facility] 2/9 01/06/2015  Decreased Interest 0  Down, Depressed, Hopeless 0  PHQ - 2 Score 0     Activities of Daily Living In your present state of health, do you have any difficulty performing the following activities?:  Driving? No Managing money?  No Feeding yourself? No Getting from bed to chair? No Climbing a flight of stairs? No Preparing food and eating?: No Bathing or showering? No Getting dressed: No Getting to the toilet? No Using the toilet: No Moving around from place to place: No In the past year have you fallen or had a near  fall?:No   Home Safety Has smoke detector and wears seat belts. No firearms. No excess sun exposure. Are there smokers in your home (other than you)?  No Do you feel safe at home?  Yes  Hearing Difficulties: No Do you often ask people to speak up or repeat themselves? No Do you experience ringing or noises in your ears? No  Do you have difficulty understanding soft or whispered voices? No    Cognitive Testing  Alert? Yes   Normal Appearance? Yes  Oriented to person? Yes  Place? Yes   Time? Yes  Recall of three objects?  Yes  Can perform simple calculations? Yes  Displays appropriate judgment? Yes  Can read the correct time from a watch face? Yes  Do you feel that you have a problem with memory? No  Do you often misplace items? No   Advanced Directives have been discussed with the patient? Yes   Current Physicians/Providers and Suppliers  1. Terri Piedra, FNP - Internal Medicine 2. Teena Irani, MD - Gastroenterology 3. Peter Martinique, MD - Cardiology  Indicate any recent Medical Services you may have received from other than [facility] providers in the past year (date may be approximate).  All answers were reviewed with the patient and necessary referrals were made:  Mauricio Po, [place]   11/29/2015    Allergies  Allergen Reactions  . Erythromycin Hives  . Penicillins Hives  . Shellfish Allergy Hives     Outpatient Medications Prior to Visit  Medication Sig Dispense Refill  . acetaminophen (TYLENOL) 500 MG tablet Take 1,000 mg by mouth every 6 (six) hours as needed (Pain).     [name] aspirin 81 MG tablet Take 81 mg by mouth daily.    . Diclofenac Sodium 2 % SOLN Apply 1 application topically 2 (two) times daily as needed. Apply 1 pump twice daily. 112 g 3  . metoprolol tartrate (LOPRESSOR) 25 MG tablet Take 0.5 tablets (12.5 mg total) by mouth 2 (two) times daily. 180 tablet 3  . nitroGLYCERIN (NITROSTAT) 0.4 MG SL tablet Place 1 tablet (0.4 mg total) under the tongue every 5  (five) minutes as needed for chest pain. 25 tablet 3  . omeprazole (PRILOSEC) 40 MG capsule Take 1 capsule (40 mg total) by mouth daily. 90 capsule 1  . ramipril (ALTACE) 5 MG capsule TAKE 1 CAPSULE BY MOUTH EVERY DAY 90 capsule 3  . rosuvastatin (CRESTOR) 40 MG tablet Take 1 tablet (40 mg total) by mouth daily. 90 tablet 3  . Turmeric 500 MG CAPS Take 500 mg by mouth.    . gabapentin (NEURONTIN) 100 MG capsule Take 2 capsules (200 mg total) by mouth at bedtime. (Patient taking differently: Take 200 mg by mouth as needed. ) 60 capsule 3  . methocarbamol (ROBAXIN) 500 MG tablet Take 1 tablet (500 mg total) by mouth every 6 (six) hours as needed for muscle spasms. (Patient taking differently: Take 500 mg by mouth as needed for muscle spasms. ) 60 tablet 0  . promethazine-dextromethorphan (PROMETHAZINE-DM) 6.25-15 MG/5ML syrup Take 5 mLs by mouth 4 (four) times daily as needed for cough. 118 mL 0   No facility-administered medications prior to visit.      Past Medical History:  Diagnosis Date  . Aortic valve sclerosis    echo [id number], soft systolic murmur  . 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 Carotid artery disease ([place])    40-59% bilateral, doppler December 2010  . Coronary artery disease    a. BMS-mid RCA 11/2007 b. stress echo 02/2009 subtle inferior HK, lateral ST depressions with stress c. follow-up cath 02/2009: RCA stent patent, severe but stable stenosis of distal posterior lateral branch RCA. LV normal, med tx unless more sx c. ETT Myoview 08/27/12: negative for ischemia; EF 64%  . Depression    Mild, August, 2012  . Dyslipidemia   . Ejection fraction    EF normal, catheterization, 2011 /  EF 55%, echo, 2010  . Fluid overload    Mild, stable since hospitalization in the past  . GERD (gastroesophageal reflux disease)    occarionally, takes Pepcid over the counter  . Heart murmur    has, occasional PVC's  . Hyperlipidemia   . Hypertension   . Myocardial infarction    2009  . PVC  (premature ventricular contraction)    per prior Holter monitor  . Right hip pain 12/2009  . Shellfish allergy      Past Surgical History:  Procedure Laterality Date  . CARDIAC CATHETERIZATION  1/211   Patent RCA stent, stenotic PLB supplying small distribution; medically managed  . CORONARY ANGIOPLASTY WITH STENT PLACEMENT  11/2007   BMS-mid RCA  . DIAGNOSTIC LAPAROSCOPY     1982 for endometriosis  . DILATION AND CURETTAGE OF UTERUS  2005  . hx of wisdom teeth surgery    .  TOTAL HIP ARTHROPLASTY Right 07/08/2014   Procedure: RIGHT TOTAL HIP ARTHROPLASTY ANTERIOR APPROACH;  Surgeon: Mcarthur Rossetti, MD;  Location: [facility];  Service: Orthopedics;  Laterality: Right;     Family History  Problem Relation Age of Onset  . Heart attack Father 67  . Stroke Mother   . Heart attack Paternal Grandfather   . Heart disease Sister      Social History   Social History  . Marital status: Divorced    Spouse name: N/A  . Number of children: 1  . Years of education: 29   Occupational History  . Retired - Retail buyer    Social History Main Topics  . Smoking status: Never Smoker  . Smokeless tobacco: Never Used  . Alcohol use 4.2 oz/week    7 Glasses of wine per week     Comment: 1 glass of wine/night  . Drug use: No  . Sexual activity: Yes   Other Topics Concern  . Not on file   Social History Narrative   Fun: Dance, garden,    Denies religious beliefs effecting health care.   Denies abuse and feels safe at home     Review of Systems  Constitutional: Denies fever, chills, fatigue, or significant weight gain/loss. HENT: Head: Denies headache or neck pain Ears: Denies changes in hearing, ringing in ears, earache, drainage Nose: Denies discharge, stuffiness, itching, nosebleed, sinus pain Throat: Denies sore throat, hoarseness, dry mouth, sores, thrush Eyes: Denies loss/changes in vision, pain, redness, blurry/double vision, flashing lights Cardiovascular:  Denies chest pain/discomfort, tightness, palpitations, shortness of breath with activity, difficulty lying down, swelling, sudden awakening with shortness of breath Respiratory: Denies shortness of breath, cough, sputum production, wheezing Gastrointestinal: Denies dysphasia, heartburn, change in appetite, nausea, change in bowel habits, rectal bleeding, constipation, diarrhea, yellow skin or eyes Genitourinary: Denies frequency, urgency, burning/pain, blood in urine, incontinence, change in urinary strength. Musculoskeletal: Denies muscle/joint pain, stiffness, back pain, redness or swelling of joints, trauma Skin: Denies rashes, lumps, itching, dryness, color changes, or hair/nail changes Neurological: Denies dizziness, fainting, seizures, weakness, numbness, tingling, tremor Psychiatric - Denies nervousness, stress, depression or memory loss Endocrine: Denies heat or cold intolerance, sweating, frequent urination, excessive thirst, changes in appetite Hematologic: Denies ease of bruising or bleeding    Objective:     BP 128/84 (BP Location: Left Arm, Patient Position: Sitting, Cuff Size: Normal)   Pulse (!) 54   Temp 97.7 F (36.5 C) (Oral)   Resp 16   Ht 5\' 4"  (1.626 m)   Wt 175 lb (79.4 kg)   SpO2 94%   BMI 30.04 kg/m  Nursing note and vital signs reviewed.  Physical Exam  Constitutional: She is oriented to person, place, and time. She appears well-developed and well-nourished.  HENT:  Head: Normocephalic.  Right Ear: Hearing, tympanic membrane, external ear and ear canal normal.  Left Ear: Hearing, tympanic membrane, external ear and ear canal normal.  Nose: Nose normal.  Mouth/Throat: Uvula is midline, oropharynx is clear and moist and mucous membranes are normal.  Eyes: Conjunctivae and EOM are normal. Pupils are equal, round, and reactive to light.  Neck: Neck supple. No JVD present. No tracheal deviation present. No thyromegaly present.  Cardiovascular: Normal rate,  regular rhythm, normal heart sounds and intact distal pulses.   Pulmonary/Chest: Effort normal and breath sounds normal.  Abdominal: Soft. Bowel sounds are normal. She exhibits no distension and no mass. There is no tenderness. There is no rebound and no guarding.  Musculoskeletal: Normal range of motion. She exhibits no edema or tenderness.  Lymphadenopathy:    She has no cervical adenopathy.  Neurological: She is alert and oriented to person, place, and time. She has normal reflexes. No cranial nerve deficit. She exhibits normal muscle tone. Coordination normal.  Skin: Skin is warm and dry.  Psychiatric: She has a normal mood and affect. Her behavior is normal. Judgment and thought content normal.       Assessment & Plan:   During the course of the visit the patient was educated and counseled about appropriate screening and preventive services including:    Pneumococcal vaccine   Td vaccine  Colorectal cancer screening  Nutrition counseling   Diet review for nutrition referral? Yes ____  Not Indicated _X___   Patient Instructions (the written plan) was given to the patient.  Medicare Attestation I have personally reviewed: The patient's medical and social history Their use of alcohol, tobacco or illicit drugs Their current medications and supplements The patient's functional ability including ADLs,fall risks, home safety risks, cognitive, and hearing and visual impairment Diet and physical activities Evidence for depression or mood disorders  The patient's weight, height, BMI,  have been recorded in the chart.  I have made referrals, counseling, and provided education to the patient based on review of the above and I have provided the patient with a written personalized care plan for preventive services.     Problem List Items Addressed This Visit      Cardiovascular and Mediastinum   Carotid artery disease ([place])    Carotid artery disease monitored through risk factor  reduction including hyperlipidemia and obesity. Appears adequately controlled with current medication regimen. Continue to monitor.        Other   Routine general medical examination at a health care facility    1) Anticipatory Guidance: Discussed importance of wearing a seatbelt while driving and not texting while driving; changing batteries in smoke detector at least once annually; wearing suntan lotion when outside; eating a balanced and moderate diet; getting physical activity at least 30 minutes per day.  2) Immunizations / Screenings / Labs:  Tetanus and Pneumovax updated today. All other immunizations are up-to-date per recommendations. All screenings are up-to-date per recommendations. Obtain CBC, CMET, and lipid profile.    Overall well exam with risk factors for cardiovascular disease including hyperlipidemia and obesity with a BMI of 30. Recommend weight loss of 510% of current body weight through nutrition and physical activity changes. Continue other healthy lifestyle behaviors and choices. Follow-up prevention exam in 1 year. Follow-up office visit pending blood work and for chronic conditions.       Relevant Orders   CBC   Comprehensive metabolic panel   Lipid panel   Medicare annual wellness visit, subsequent - Primary    Reviewed and updated patient's medical, surgical, family and social history. Medications and allergies were also reviewed. Basic screenings for depression, activities of daily living, hearing, cognition and safety were performed. Provider list was updated and health plan was provided to the patient.        Other Visit Diagnoses    Encounter for immunization       Relevant Orders   CBC   Comprehensive metabolic panel   Lipid panel   Need for diphtheria-tetanus-pertussis (Tdap) vaccine, adult/adolescent       Relevant Orders   Tdap vaccine greater than or equal to 7yo IM (Completed)   Need for 23-polyvalent pneumococcal polysaccharide vaccine  Relevant Orders   Pneumococcal polysaccharide vaccine 23-valent greater than or equal to 2yo subcutaneous/IM (Completed)       I have discontinued Ms. Milling's methocarbamol, gabapentin, and promethazine-dextromethorphan. I am also having her maintain her acetaminophen, aspirin, Turmeric, Diclofenac Sodium, metoprolol tartrate, ramipril, rosuvastatin, nitroGLYCERIN, and omeprazole.    Follow-up: Return in about 6 months (around 05/29/2016), or if symptoms worsen or fail to improve.   Mauricio Po, FNP

## 2015-11-29 NOTE — Patient Instructions (Addendum)
Thank you for choosing [facility].  SUMMARY AND INSTRUCTIONS:  For sleep try Ginger Organ, Aloe, or Snake plant to help.   Medication:  Your prescription(s) have been submitted to your pharmacy or been printed and provided for you. Please take as directed and contact our office if you believe you are having problem(s) with the medication(s) or have any questions.  Labs:  Please stop by the lab on the lower level of the building for your blood work. Your results will be released to MyChart (or called to you) after review, usually within 72 hours after test completion. If any changes need to be made, you will be notified at that same time.  1.) The lab is open from 7:30am to 5:30 pm Monday-Friday 2.) No appointment is necessary 3.) Fasting (if needed) is 6-8 hours after food and drink; black coffee and water are okay    Health Maintenance  Topic Date Due  . TETANUS/TDAP  01/[birth year]/1968  . PNA vac Low Risk Adult (2 of 2 - PPSV23) 01/06/2016  . PAP SMEAR  06/05/2016  . MAMMOGRAM  03/05/2017  . COLONOSCOPY  02/23/2023  . INFLUENZA VACCINE  Completed  . DEXA SCAN  Completed  . ZOSTAVAX  Completed  . Hepatitis C Screening  Completed    Health Maintenance, Female Adopting a healthy lifestyle and getting preventive care can go a long way to promote health and wellness. Talk with your health care provider about what schedule of regular examinations is right for you. This is a good chance for you to check in with your provider about disease prevention and staying healthy. In between checkups, there are plenty of things you can do on your own. Experts have done a lot of research about which lifestyle changes and preventive measures are most likely to keep you healthy. Ask your health care provider for more information. WEIGHT AND DIET  Eat a healthy diet  Be sure to include plenty of vegetables, fruits, low-fat dairy products, and lean protein.  Do not eat a lot of foods high in  solid fats, added sugars, or salt.  Get regular exercise. This is one of the most important things you can do for your health.  Most adults should exercise for at least 150 minutes each week. The exercise should increase your heart rate and make you sweat (moderate-intensity exercise).  Most adults should also do strengthening exercises at least twice a week. This is in addition to the moderate-intensity exercise.  Maintain a healthy weight  Body mass index (BMI) is a measurement that can be used to identify possible weight problems. It estimates body fat based on height and weight. Your health care provider can help determine your BMI and help you achieve or maintain a healthy weight.  For females 67 years of age and older:   A BMI below 18.5 is considered underweight.  A BMI of 18.5 to 24.9 is normal.  A BMI of 25 to 29.9 is considered overweight.  A BMI of 30 and above is considered obese.  Watch levels of cholesterol and blood lipids  You should start having your blood tested for lipids and cholesterol at 67 years of age, then have this test every 5 years.  You may need to have your cholesterol levels checked more often if:  Your lipid or cholesterol levels are high.  You are older than 67 years of age.  You are at high risk for heart disease.  CANCER SCREENING   Lung Cancer  Lung  cancer screening is recommended for adults 84-17 years old who are at high risk for lung cancer because of a history of smoking.  A yearly low-dose CT scan of the lungs is recommended for people who:  Currently smoke.  Have quit within the past 15 years.  Have at least a 30-pack-year history of smoking. A pack year is smoking an average of one pack of cigarettes a day for 1 year.  Yearly screening should continue until it has been 15 years since you quit.  Yearly screening should stop if you develop a health problem that would prevent you from having lung cancer treatment.  Breast  Cancer  Practice breast self-awareness. This means understanding how your breasts normally appear and feel.  It also means doing regular breast self-exams. Let your health care provider know about any changes, no matter how small.  If you are in your 20s or 30s, you should have a clinical breast exam (CBE) by a health care provider every 1-3 years as part of a regular health exam.  If you are 12 or older, have a CBE every year. Also consider having a breast X-ray (mammogram) every year.  If you have a family history of breast cancer, talk to your health care provider about genetic screening.  If you are at high risk for breast cancer, talk to your health care provider about having an MRI and a mammogram every year.  Breast cancer gene (BRCA) assessment is recommended for women who have family members with BRCA-related cancers. BRCA-related cancers include:  Breast.  Ovarian.  Tubal.  Peritoneal cancers.  Results of the assessment will determine the need for genetic counseling and BRCA1 and BRCA2 testing. Cervical Cancer Your health care provider may recommend that you be screened regularly for cancer of the pelvic organs (ovaries, uterus, and vagina). This screening involves a pelvic examination, including checking for microscopic changes to the surface of your cervix (Pap test). You may be encouraged to have this screening done every 3 years, beginning at age 56.  For women ages 67-65, health care providers may recommend pelvic exams and Pap testing every 3 years, or they may recommend the Pap and pelvic exam, combined with testing for human papilloma virus (HPV), every 5 years. Some types of HPV increase your risk of cervical cancer. Testing for HPV may also be done on women of any age with unclear Pap test results.  Other health care providers may not recommend any screening for nonpregnant women who are considered low risk for pelvic cancer and who do not have symptoms. Ask your  health care provider if a screening pelvic exam is right for you.  If you have had past treatment for cervical cancer or a condition that could lead to cancer, you need Pap tests and screening for cancer for at least 20 years after your treatment. If Pap tests have been discontinued, your risk factors (such as having a new sexual partner) need to be reassessed to determine if screening should resume. Some women have medical problems that increase the chance of getting cervical cancer. In these cases, your health care provider may recommend more frequent screening and Pap tests. Colorectal Cancer  This type of cancer can be detected and often prevented.  Routine colorectal cancer screening usually begins at 67 years of age and continues through 67 years of age.  Your health care provider may recommend screening at an earlier age if you have risk factors for colon cancer.  Your health care provider  may also recommend using home test kits to check for hidden blood in the stool.  A small camera at the end of a tube can be used to examine your colon directly (sigmoidoscopy or colonoscopy). This is done to check for the earliest forms of colorectal cancer.  Routine screening usually begins at age 74.  Direct examination of the colon should be repeated every 5-10 years through 67 years of age. However, you may need to be screened more often if early forms of precancerous polyps or small growths are found. Skin Cancer  Check your skin from head to toe regularly.  Tell your health care provider about any new moles or changes in moles, especially if there is a change in a mole's shape or color.  Also tell your health care provider if you have a mole that is larger than the size of a pencil eraser.  Always use sunscreen. Apply sunscreen liberally and repeatedly throughout the day.  Protect yourself by wearing long sleeves, pants, a wide-brimmed hat, and sunglasses whenever you are outside. HEART  DISEASE, DIABETES, AND HIGH BLOOD PRESSURE   High blood pressure causes heart disease and increases the risk of stroke. High blood pressure is more likely to develop in:  People who have blood pressure in the high end of the normal range (130-139/85-89 mm Hg).  People who are overweight or obese.  People who are African American.  If you are 28-20 years of age, have your blood pressure checked every 3-5 years. If you are 29 years of age or older, have your blood pressure checked every year. You should have your blood pressure measured twice--once when you are at a hospital or clinic, and once when you are not at a hospital or clinic. Record the average of the two measurements. To check your blood pressure when you are not at a hospital or clinic, you can use:  An automated blood pressure machine at a pharmacy.  A home blood pressure monitor.  If you are between 21 years and 74 years old, ask your health care provider if you should take aspirin to prevent strokes.  Have regular diabetes screenings. This involves taking a blood sample to check your fasting blood sugar level.  If you are at a normal weight and have a low risk for diabetes, have this test once every three years after 67 years of age.  If you are overweight and have a high risk for diabetes, consider being tested at a younger age or more often. PREVENTING INFECTION  Hepatitis B  If you have a higher risk for hepatitis B, you should be screened for this virus. You are considered at high risk for hepatitis B if:  You were born in a country where hepatitis B is common. Ask your health care provider which countries are considered high risk.  Your parents were born in a high-risk country, and you have not been immunized against hepatitis B (hepatitis B vaccine).  You have HIV or AIDS.  You use needles to inject street drugs.  You live with someone who has hepatitis B.  You have had sex with someone who has hepatitis  B.  You get hemodialysis treatment.  You take certain medicines for conditions, including cancer, organ transplantation, and autoimmune conditions. Hepatitis C  Blood testing is recommended for:  Everyone born from [birth year] through 1967.  Anyone with known risk factors for hepatitis C. Sexually transmitted infections (STIs)  You should be screened for sexually transmitted infections (STIs) including  gonorrhea and chlamydia if:  You are sexually active and are younger than 67 years of age.  You are older than 67 years of age and your health care provider tells you that you are at risk for this type of infection.  Your sexual activity has changed since you were last screened and you are at an increased risk for chlamydia or gonorrhea. Ask your health care provider if you are at risk.  If you do not have HIV, but are at risk, it may be recommended that you take a prescription medicine daily to prevent HIV infection. This is called pre-exposure prophylaxis (PrEP). You are considered at risk if:  You are sexually active and do not regularly use condoms or know the HIV status of your partner(s).  You take drugs by injection.  You are sexually active with a partner who has HIV. Talk with your health care provider about whether you are at high risk of being infected with HIV. If you choose to begin PrEP, you should first be tested for HIV. You should then be tested every 3 months for as long as you are taking PrEP.  PREGNANCY   If you are premenopausal and you may become pregnant, ask your health care provider about preconception counseling.  If you may become pregnant, take 400 to 800 micrograms (mcg) of folic acid every day.  If you want to prevent pregnancy, talk to your health care provider about birth control (contraception). OSTEOPOROSIS AND MENOPAUSE   Osteoporosis is a disease in which the bones lose minerals and strength with aging. This can result in serious bone fractures. Your  risk for osteoporosis can be identified using a bone density scan.  If you are 74 years of age or older, or if you are at risk for osteoporosis and fractures, ask your health care provider if you should be screened.  Ask your health care provider whether you should take a calcium or vitamin D supplement to lower your risk for osteoporosis.  Menopause may have certain physical symptoms and risks.  Hormone replacement therapy may reduce some of these symptoms and risks. Talk to your health care provider about whether hormone replacement therapy is right for you.  HOME CARE INSTRUCTIONS   Schedule regular health, dental, and eye exams.  Stay current with your immunizations.   Do not use any tobacco products including cigarettes, chewing tobacco, or electronic cigarettes.  If you are pregnant, do not drink alcohol.  If you are breastfeeding, limit how much and how often you drink alcohol.  Limit alcohol intake to no more than 1 drink per day for nonpregnant women. One drink equals 12 ounces of beer, 5 ounces of wine, or 1 ounces of hard liquor.  Do not use street drugs.  Do not share needles.  Ask your health care provider for help if you need support or information about quitting drugs.  Tell your health care provider if you often feel depressed.  Tell your health care provider if you have ever been abused or do not feel safe at home.   This information is not intended to replace advice given to you by your health care provider. Make sure you discuss any questions you have with your health care provider.   Document Released: 08/20/2010 Document Revised: 02/25/2014 Document Reviewed: 01/06/2013 Elsevier Interactive Patient Education [employer].

## 2015-11-29 NOTE — Assessment & Plan Note (Signed)
Carotid artery disease monitored through risk factor reduction including hyperlipidemia and obesity. Appears adequately controlled with current medication regimen. Continue to monitor.

## 2015-11-30 ENCOUNTER — Other Ambulatory Visit (INDEPENDENT_AMBULATORY_CARE_PROVIDER_SITE_OTHER): Payer: Commercial Managed Care - HMO

## 2015-11-30 ENCOUNTER — Encounter: Payer: Self-pay | Admitting: Cardiology

## 2015-11-30 ENCOUNTER — Encounter: Payer: Self-pay | Admitting: Family

## 2015-11-30 DIAGNOSIS — Z0000 Encounter for general adult medical examination without abnormal findings: Secondary | ICD-10-CM | POA: Diagnosis not present

## 2015-11-30 DIAGNOSIS — Z23 Encounter for immunization: Secondary | ICD-10-CM

## 2015-11-30 LAB — CBC
HCT: 37.8 % (ref 36.0–46.0)
Hemoglobin: 12.9 g/dL (ref 12.0–15.0)
MCHC: 34 g/dL (ref 30.0–36.0)
MCV: 90.8 fl (ref 78.0–100.0)
Platelets: 235 10*3/uL (ref 150.0–400.0)
RBC: 4.16 Mil/uL (ref 3.87–5.11)
RDW: 13.3 % (ref 11.5–15.5)
WBC: 8.8 10*3/uL (ref 4.0–10.5)

## 2015-11-30 LAB — LIPID PANEL
CHOLESTEROL: 183 mg/dL (ref 0–200)
HDL: 59.4 mg/dL (ref >39.00)
LDL Cholesterol: 98 mg/dL (ref 0–99)
NONHDL: 123.29
Total CHOL/HDL Ratio: 3
Triglycerides: 127 mg/dL (ref 0.0–149.0)
VLDL: 25.4 mg/dL (ref 0.0–40.0)

## 2015-11-30 LAB — COMPREHENSIVE METABOLIC PANEL
ALBUMIN: 3.9 g/dL (ref 3.5–5.2)
ALK PHOS: 85 U/L (ref 39–117)
ALT: 20 U/L (ref 0–35)
AST: 20 U/L (ref 0–37)
BILIRUBIN TOTAL: 0.4 mg/dL (ref 0.2–1.2)
BUN: 19 mg/dL (ref 6–23)
CO2: 29 mEq/L (ref 19–32)
CREATININE: 0.8 mg/dL (ref 0.40–1.20)
Calcium: 9.5 mg/dL (ref 8.4–10.5)
Chloride: 105 mEq/L (ref 96–112)
GFR: 76.08 mL/min (ref >60.00)
GLUCOSE: 96 mg/dL (ref 70–99)
Potassium: 4.5 mEq/L (ref 3.5–5.1)
SODIUM: 139 meq/L (ref 135–145)
TOTAL PROTEIN: 6.9 g/dL (ref 6.0–8.3)

## 2015-12-09 ENCOUNTER — Encounter: Payer: Self-pay | Admitting: Family

## 2015-12-28 ENCOUNTER — Other Ambulatory Visit: Payer: Self-pay | Admitting: Family

## 2016-01-04 ENCOUNTER — Telehealth (INDEPENDENT_AMBULATORY_CARE_PROVIDER_SITE_OTHER): Payer: Self-pay | Admitting: *Deleted

## 2016-01-04 NOTE — Telephone Encounter (Signed)
ERROR

## 2016-01-05 ENCOUNTER — Encounter: Payer: Self-pay | Admitting: Family

## 2016-01-05 ENCOUNTER — Ambulatory Visit (INDEPENDENT_AMBULATORY_CARE_PROVIDER_SITE_OTHER): Payer: Commercial Managed Care - HMO | Admitting: Family

## 2016-01-05 DIAGNOSIS — R42 Dizziness and giddiness: Secondary | ICD-10-CM | POA: Diagnosis not present

## 2016-01-05 MED ORDER — MECLIZINE HCL 25 MG PO TABS: 25.0000 mg | ORAL_TABLET | Freq: Four times a day (QID) | ORAL | 0 refills | Status: DC | PRN

## 2016-01-05 NOTE — Progress Notes (Signed)
Subjective:    Patient ID: Courtney [name], female    DOB: 1950/07/30, 65 y.o.   MRN: [id number]  Chief Complaint  Patient presents with  . Dizziness    feeling heavy headed, dizziness, x2 weeks    HPI:  Courtney [name] is a 65 y.o. female who  has a past medical history of Aortic valve sclerosis; Arthritis; Carotid artery disease ([place]); Coronary artery disease; Depression; Dyslipidemia; Ejection fraction; Fluid overload; GERD (gastroesophageal reflux disease); Heart murmur; Hyperlipidemia; Hypertension; Myocardial infarction; PVC (premature ventricular contraction); Right hip pain (12/2009); and Shellfish allergy. and presents today for an office visit.   This is a new problem. Associated symptom of dizziness and feeling a heavy head has been going on for about 2 weeks. Room spinning with lying down and is off balance. Denies any head injury, congestion, hearing loss or changes in vision. Does have some tinnitus. Modifying factors include dramamine and Claritin. Symptoms are occurring daily. Little caffeine intake and minimal water intake.    Allergies  Allergen Reactions  . Erythromycin Hives  . Penicillins Hives  . Shellfish Allergy Hives     Outpatient Medications Prior to Visit  Medication Sig Dispense Refill  . acetaminophen (TYLENOL) 500 MG tablet Take 1,000 mg by mouth every 6 (six) hours as needed (Pain).     [name] aspirin 81 MG tablet Take 81 mg by mouth daily.    . Diclofenac Sodium 2 % SOLN Apply 1 application topically 2 (two) times daily as needed. Apply 1 pump twice daily. 112 g 3  . metoprolol tartrate (LOPRESSOR) 25 MG tablet Take 0.5 tablets (12.5 mg total) by mouth 2 (two) times daily. 180 tablet 3  . nitroGLYCERIN (NITROSTAT) 0.4 MG SL tablet Place 1 tablet (0.4 mg total) under the tongue every 5 (five) minutes as needed for chest pain. 25 tablet 3  . omeprazole (PRILOSEC) 40 MG capsule TAKE 1 CAPSULE (40 MG TOTAL) BY MOUTH DAILY. 90 capsule 3  . ramipril (ALTACE) 5 MG  capsule TAKE 1 CAPSULE BY MOUTH EVERY DAY 90 capsule 3  . rosuvastatin (CRESTOR) 40 MG tablet Take 1 tablet (40 mg total) by mouth daily. 90 tablet 3  . Turmeric 500 MG CAPS Take 500 mg by mouth.     No facility-administered medications prior to visit.       Past Surgical History:  Procedure Laterality Date  . CARDIAC CATHETERIZATION  1/211   Patent RCA stent, stenotic PLB supplying small distribution; medically managed  . CORONARY ANGIOPLASTY WITH STENT PLACEMENT  11/2007   BMS-mid RCA  . DIAGNOSTIC LAPAROSCOPY     1982 for endometriosis  . DILATION AND CURETTAGE OF UTERUS  2005  . hx of wisdom teeth surgery    . TOTAL HIP ARTHROPLASTY Right 07/08/2014   Procedure: RIGHT TOTAL HIP ARTHROPLASTY ANTERIOR APPROACH;  Surgeon: Mcarthur Rossetti, MD;  Location: [facility];  Service: Orthopedics;  Laterality: Right;      Past Medical History:  Diagnosis Date  . Aortic valve sclerosis    echo 123456, soft systolic murmur  . 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 Carotid artery disease ([place])    40-59% bilateral, doppler December 2010  . Coronary artery disease    a. BMS-mid RCA 11/2007 b. stress echo 02/2009 subtle inferior HK, lateral ST depressions with stress c. follow-up cath 02/2009: RCA stent patent, severe but stable stenosis of distal posterior lateral branch RCA. LV normal, med tx unless more sx c. ETT Myoview 08/27/12:  negative for ischemia; EF 64%  . Depression    Mild, August, 2012  . Dyslipidemia   . Ejection fraction    EF normal, catheterization, 2011 /  EF 55%, echo, 2010  . Fluid overload    Mild, stable since hospitalization in the past  . GERD (gastroesophageal reflux disease)    occarionally, takes Pepcid over the counter  . Heart murmur    has, occasional PVC's  . Hyperlipidemia   . Hypertension   . Myocardial infarction    2009  . PVC (premature ventricular contraction)    per prior Holter monitor  . Right hip pain 12/2009  . Shellfish allergy       Review  of Systems  Constitutional: Negative for chills and fever.  Respiratory: Negative for chest tightness and shortness of breath.   Cardiovascular: Negative for chest pain, palpitations and leg swelling.  Neurological: Positive for dizziness and light-headedness. Negative for weakness.      Objective:    BP 138/88 (BP Location: Left Arm, Patient Position: Sitting, Cuff Size: Normal)   Pulse (!) 58   Temp 97.9 F (36.6 C) (Oral)   Resp 16   Ht 5\' 4"  (1.626 m)   Wt 177 lb 12.8 oz (80.6 kg)   SpO2 98%   BMI 30.52 kg/m  Nursing note and vital signs reviewed.  Physical Exam  Constitutional: She is oriented to person, place, and time. She appears well-developed and well-nourished. No distress.  HENT:  Right Ear: Hearing, tympanic membrane, external ear and ear canal normal.  Left Ear: Hearing, tympanic membrane, external ear and ear canal normal.  Cardiovascular: Normal rate, regular rhythm, normal heart sounds and intact distal pulses.   Pulmonary/Chest: Effort normal and breath sounds normal.  Neurological: She is alert and oriented to person, place, and time.  Positive modified Dix-Hallpike with head neutral. Negative for left/right.   Skin: Skin is warm and dry.  Psychiatric: She has a normal mood and affect. Her behavior is normal. Judgment and thought content normal.       Assessment & Plan:   Problem List Items Addressed This Visit      Other   Vertigo    Symptoms and exam consistent with vertigo given positive modified Dix-Hallpike. Continue meclizine as needed for dizziness. Drink plenty of fluids. Change positions slowly. Refer to physical therapy for neuro rehabilitation. Follow up if symptoms worsen or do not improve.       Relevant Medications   meclizine (ANTIVERT) 25 MG tablet   Other Relevant Orders   Ambulatory Referral to Neuro Rehab       I have discontinued Ms. Locher's Turmeric. I am also having her start on meclizine. Additionally, I am having her  maintain her acetaminophen, aspirin, Diclofenac Sodium, metoprolol tartrate, ramipril, rosuvastatin, nitroGLYCERIN, and omeprazole.   Meds ordered this encounter  Medications  . meclizine (ANTIVERT) 25 MG tablet    Sig: Take 1 tablet (25 mg total) by mouth 4 (four) times daily as needed for dizziness.    Dispense:  60 tablet    Refill:  0    Order Specific Question:   Supervising Provider    Answer:   Pricilla Holm A [id number]     Follow-up: Return if symptoms worsen or fail to improve.  Mauricio Po, FNP

## 2016-01-05 NOTE — Assessment & Plan Note (Signed)
Symptoms and exam consistent with vertigo given positive modified Dix-Hallpike. Continue meclizine as needed for dizziness. Drink plenty of fluids. Change positions slowly. Refer to physical therapy for neuro rehabilitation. Follow up if symptoms worsen or do not improve.

## 2016-01-05 NOTE — Patient Instructions (Addendum)
Thank you for choosing [facility].  SUMMARY AND INSTRUCTIONS:   Medication:  Your prescription(s) have been submitted to your pharmacy or been printed and provided for you. Please take as directed and contact our office if you believe you are having problem(s) with the medication(s) or have any questions.  Referrals:  They will call to schedule your appointment with physical therapy.  Referrals have been made during this visit. You should expect to hear back from our schedulers in about 7-10 days in regards to establishing an appointment with the specialists we discussed.   Follow up:  If your symptoms worsen or fail to improve, please contact our office for further instruction, or in case of emergency go directly to the emergency room at the closest medical facility.     Benign Positional Vertigo Introduction Vertigo is the feeling that you or your surroundings are moving when they are not. Benign positional vertigo is the most common form of vertigo. The cause of this condition is not serious (is benign). This condition is triggered by certain movements and positions (is positional). This condition can be dangerous if it occurs while you are doing something that could endanger you or others, such as driving. What are the causes? In many cases, the cause of this condition is not known. It may be caused by a disturbance in an area of the inner ear that helps your brain to sense movement and balance. This disturbance can be caused by a viral infection (labyrinthitis), head injury, or repetitive motion. What increases the risk? This condition is more likely to develop in:  Women.  People who are 18 years of age or older. What are the signs or symptoms? Symptoms of this condition usually happen when you move your head or your eyes in different directions. Symptoms may start suddenly, and they usually last for less than a minute. Symptoms may include:  Loss of balance and  falling.  Feeling like you are spinning or moving.  Feeling like your surroundings are spinning or moving.  Nausea and vomiting.  Blurred vision.  Dizziness.  Involuntary eye movement (nystagmus). Symptoms can be mild and cause only slight annoyance, or they can be severe and interfere with daily life. Episodes of benign positional vertigo may return (recur) over time, and they may be triggered by certain movements. Symptoms may improve over time. How is this diagnosed? This condition is usually diagnosed by medical history and a physical exam of the head, neck, and ears. You may be referred to a health care provider who specializes in ear, nose, and throat (ENT) problems (otolaryngologist) or a provider who specializes in disorders of the nervous system (neurologist). You may have additional testing, including:  MRI.  A CT scan.  Eye movement tests. Your health care provider may ask you to change positions quickly while he or she watches you for symptoms of benign positional vertigo, such as nystagmus. Eye movement may be tested with an electronystagmogram (ENG), caloric stimulation, the Dix-Hallpike test, or the roll test.  An electroencephalogram (EEG). This records electrical activity in your brain.  Hearing tests. How is this treated? Usually, your health care provider will treat this by moving your head in specific positions to adjust your inner ear back to normal. Surgery may be needed in severe cases, but this is rare. In some cases, benign positional vertigo may resolve on its own in 2-4 weeks. Follow these instructions at home: Safety  Move slowly.Avoid sudden body or head movements.  Avoid driving.  Avoid operating heavy machinery.  Avoid doing any tasks that would be dangerous to you or others if a vertigo episode would occur.  If you have trouble walking or keeping your balance, try using a cane for stability. If you feel dizzy or unstable, sit down right  away.  Return to your normal activities as told by your health care provider. Ask your health care provider what activities are safe for you. General instructions  Take over-the-counter and prescription medicines only as told by your health care provider.  Avoid certain positions or movements as told by your health care provider.  Drink enough fluid to keep your urine clear or pale yellow.  Keep all follow-up visits as told by your health care provider. This is important. Contact a health care provider if:  You have a fever.  Your condition gets worse or you develop new symptoms.  Your family or friends notice any behavioral changes.  Your nausea or vomiting gets worse.  You have numbness or a "pins and needles" sensation. Get help right away if:  You have difficulty speaking or moving.  You are always dizzy.  You faint.  You develop severe headaches.  You have weakness in your legs or arms.  You have changes in your hearing or vision.  You develop a stiff neck.  You develop sensitivity to light. This information is not intended to replace advice given to you by your health care provider. Make sure you discuss any questions you have with your health care provider. Document Released: 11/12/2005 Document Revised: 07/13/2015 Document Reviewed: 05/30/2014  2017 Elsevier

## 2016-01-11 ENCOUNTER — Telehealth: Payer: Commercial Managed Care - HMO | Admitting: Family

## 2016-01-11 DIAGNOSIS — J209 Acute bronchitis, unspecified: Secondary | ICD-10-CM

## 2016-01-11 DIAGNOSIS — R05 Cough: Secondary | ICD-10-CM

## 2016-01-11 DIAGNOSIS — R059 Cough, unspecified: Secondary | ICD-10-CM

## 2016-01-11 MED ORDER — BENZONATATE 100 MG PO CAPS: 100.0000 mg | ORAL_CAPSULE | Freq: Three times a day (TID) | ORAL | 0 refills | Status: DC | PRN

## 2016-01-11 MED ORDER — PREDNISONE 10 MG (21) PO TBPK: 10.0000 mg | ORAL_TABLET | Freq: Every day | ORAL | 0 refills | Status: DC

## 2016-01-11 NOTE — Addendum Note (Signed)
Addended by: Raiford Noble on: 01/11/2016 12:24 PM   Modules accepted: Orders

## 2016-01-11 NOTE — Progress Notes (Signed)
We are sorry that you are not feeling well.  Here is how we plan to help!  Based on what you have shared with me it looks like you have upper respiratory tract inflammation that has resulted in a significant cough.  Inflammation and infection in the upper respiratory tract is commonly called bronchitis and has four common causes:  Allergies, Viral Infections, Acid Reflux and Bacterial Infections.  Allergies, viruses and acid reflux are treated by controlling symptoms or eliminating the cause. An example might be a cough caused by taking certain blood pressure medications. You stop the cough by changing the medication. Another example might be a cough caused by acid reflux. Controlling the reflux helps control the cough.  Based on your presentation I believe you most likely have A cough due to a virus.  This is called viral bronchitis and is best treated by rest, plenty of fluids and control of the cough.  You may use Ibuprofen or Tylenol as directed to help your symptoms.     In addition you may use A prescription cough medication called Tessalon Perles 100mg . You may take 1-2 capsules every 8 hours as needed for your cough.  Sterapred 10 mg dosepak  USE OF BRONCHODILATOR ("RESCUE") INHALERS: There is a risk from using your bronchodilator too frequently.  The risk is that over-reliance on a medication which only relaxes the muscles surrounding the breathing tubes can reduce the effectiveness of medications prescribed to reduce swelling and congestion of the tubes themselves.  Although you feel brief relief from the bronchodilator inhaler, your asthma may actually be worsening with the tubes becoming more swollen and filled with mucus.  This can delay other crucial treatments, such as oral steroid medications. If you need to use a bronchodilator inhaler daily, several times per day, you should discuss this with your provider.  There are probably better treatments that could be used to keep your asthma  under control.     HOME CARE . Only take medications as instructed by your medical team. . Complete the entire course of an antibiotic. . Drink plenty of fluids and get plenty of rest. . Avoid close contacts especially the very young and the elderly . Cover your mouth if you cough or cough into your sleeve. . Always remember to wash your hands . A steam or ultrasonic humidifier can help congestion.   GET HELP RIGHT AWAY IF: . You develop worsening fever. . You become short of breath . You cough up blood. . Your symptoms persist after you have completed your treatment plan MAKE SURE YOU   Understand these instructions.  Will watch your condition.  Will get help right away if you are not doing well or get worse.  Your e-visit answers were reviewed by a board certified advanced clinical practitioner to complete your personal care plan.  Depending on the condition, your plan could have included both over the counter or prescription medications. If there is a problem please reply  once you have received a response from your provider. Your safety is important to [place].  If you have drug allergies check your prescription carefully.    You can use MyChart to ask questions about today's visit, request a non-urgent call back, or ask for a work or school excuse for 24 hours related to this e-Visit. If it has been greater than 24 hours you will need to follow up with your provider, or enter a new e-Visit to address those concerns. You will get an e-mail  in the next two days asking about your experience.  I hope that your e-visit has been valuable and will speed your recovery. Thank you for using e-visits.  

## 2016-01-11 NOTE — Addendum Note (Signed)
Addended by: Raiford Noble on: 01/11/2016 01:28 PM   Modules accepted: Orders

## 2016-01-19 ENCOUNTER — Telehealth (INDEPENDENT_AMBULATORY_CARE_PROVIDER_SITE_OTHER): Payer: Self-pay

## 2016-01-19 NOTE — Telephone Encounter (Signed)
[facility] would like to know if patient needs to pre-med or not.  If patient needs to pre-med or not, they would like for a form to be faxed stating that.  Fax number is [phone].

## 2016-01-23 NOTE — Telephone Encounter (Signed)
Faxed

## 2016-01-29 ENCOUNTER — Other Ambulatory Visit: Payer: Self-pay | Admitting: Obstetrics and Gynecology

## 2016-01-29 DIAGNOSIS — Z1231 Encounter for screening mammogram for malignant neoplasm of breast: Secondary | ICD-10-CM

## 2016-03-07 ENCOUNTER — Ambulatory Visit: Payer: Commercial Managed Care - HMO

## 2016-03-26 ENCOUNTER — Ambulatory Visit
Admission: RE | Admit: 2016-03-26 | Discharge: 2016-03-26 | Disposition: A | Discharge status: Home / Self Care | Payer: Medicare HMO | Source: Ambulatory Visit | Attending: Obstetrics and Gynecology | Admitting: Obstetrics and Gynecology

## 2016-03-26 DIAGNOSIS — Z1231 Encounter for screening mammogram for malignant neoplasm of breast: Secondary | ICD-10-CM | POA: Diagnosis not present

## 2016-04-03 ENCOUNTER — Telehealth: Payer: Medicare HMO | Admitting: Nurse Practitioner

## 2016-04-03 DIAGNOSIS — J209 Acute bronchitis, unspecified: Secondary | ICD-10-CM

## 2016-04-03 MED ORDER — BENZONATATE 100 MG PO CAPS: 100.0000 mg | ORAL_CAPSULE | Freq: Two times a day (BID) | ORAL | 0 refills | Status: DC | PRN

## 2016-04-03 MED ORDER — PREDNISONE 10 MG PO TABS: 10.0000 mg | ORAL_TABLET | Freq: Every day | ORAL | 0 refills | Status: AC

## 2016-04-03 NOTE — Progress Notes (Signed)
We are sorry that you are not feeling well.  Here is how we plan to help!  Based on what you have shared with me it looks like you have upper respiratory tract inflammation that has resulted in a significant cough.  Inflammation and infection in the upper respiratory tract is commonly called bronchitis and has four common causes:  Allergies, Viral Infections, Acid Reflux and Bacterial Infections.  Allergies, viruses and acid reflux are treated by controlling symptoms or eliminating the cause. An example might be a cough caused by taking certain blood pressure medications. You stop the cough by changing the medication. Another example might be a cough caused by acid reflux. Controlling the reflux helps control the cough.  Based on your presentation I believe you most likely have A cough due to a virus.  This is called viral bronchitis and is best treated by rest, plenty of fluids and control of the cough.  You may use Ibuprofen or Tylenol as directed to help your symptoms.     In addition you may use A prescription cough medication called Tessalon Perles 100mg . You may take 1-2 capsules every 8 hours as needed for your cough.  Sterapred 10 mg dosepak  USE OF BRONCHODILATOR ("RESCUE") INHALERS: There is a risk from using your bronchodilator too frequently.  The risk is that over-reliance on a medication which only relaxes the muscles surrounding the breathing tubes can reduce the effectiveness of medications prescribed to reduce swelling and congestion of the tubes themselves.  Although you feel brief relief from the bronchodilator inhaler, your asthma may actually be worsening with the tubes becoming more swollen and filled with mucus.  This can delay other crucial treatments, such as oral steroid medications. If you need to use a bronchodilator inhaler daily, several times per day, you should discuss this with your provider.  There are probably better treatments that could be used to keep your asthma  under control.     HOME CARE . Only take medications as instructed by your medical team. . Complete the entire course of an antibiotic. . Drink plenty of fluids and get plenty of rest. . Avoid close contacts especially the very young and the elderly . Cover your mouth if you cough or cough into your sleeve. . Always remember to wash your hands . A steam or ultrasonic humidifier can help congestion.   GET HELP RIGHT AWAY IF: . You develop worsening fever. . You become short of breath . You cough up blood. . Your symptoms persist after you have completed your treatment plan MAKE SURE YOU   Understand these instructions.  Will watch your condition.  Will get help right away if you are not doing well or get worse.  Your e-visit answers were reviewed by a board certified advanced clinical practitioner to complete your personal care plan.  Depending on the condition, your plan could have included both over the counter or prescription medications. If there is a problem please reply  once you have received a response from your provider. Your safety is important to [place].  If you have drug allergies check your prescription carefully.    You can use MyChart to ask questions about today's visit, request a non-urgent call back, or ask for a work or school excuse for 24 hours related to this e-Visit. If it has been greater than 24 hours you will need to follow up with your provider, or enter a new e-Visit to address those concerns. You will get an e-mail  in the next two days asking about your experience.  I hope that your e-visit has been valuable and will speed your recovery. Thank you for using e-visits.

## 2016-05-27 ENCOUNTER — Telehealth: Payer: Medicare HMO | Admitting: Family

## 2016-05-27 DIAGNOSIS — L237 Allergic contact dermatitis due to plants, except food: Secondary | ICD-10-CM

## 2016-05-27 MED ORDER — PREDNISONE 10 MG PO TABS: 10.0000 mg | ORAL_TABLET | ORAL | 0 refills | Status: DC

## 2016-05-27 NOTE — Progress Notes (Signed)
E Visit for Rash  We are sorry that you are not feeling well. Here is how we plan to help!  Based on what you shared with me it looks like you have contact dermatitis.  Contact dermatitis is a skin rash caused by something that touches the skin and causes irritation or inflammation.  Your skin may be red, swollen, dry, cracked, and itch.  The rash should go away in a few days but can last a few weeks.  If you get a rash, it's important to figure out what caused it so the irritant can be avoided in the future. I have prescribed  Sterapred 10 mg dose pak    HOME CARE:   Take cool showers and avoid direct sunlight.  Apply cool compress or wet dressings.  Take a bath in an oatmeal bath.  Sprinkle content of one Aveeno packet under running faucet with comfortably warm water.  Bathe for 15-20 minutes, 1-2 times daily.  Pat dry with a towel. Do not rub the rash.  Use hydrocortisone cream.  Take an antihistamine like Benadryl for widespread rashes that itch.  The adult dose of Benadryl is 25-50 mg by mouth 4 times daily.  Caution:  This type of medication may cause sleepiness.  Do not drink alcohol, drive, or operate dangerous machinery while taking antihistamines.  Do not take these medications if you have prostate enlargement.  Read package instructions thoroughly on all medications that you take.  GET HELP RIGHT AWAY IF:   Symptoms don't go away after treatment.  Severe itching that persists.  If you rash spreads or swells.  If you rash begins to smell.  If it blisters and opens or develops a yellow-brown crust.  You develop a fever.  You have a sore throat.  You become short of breath.  MAKE SURE YOU:  Understand these instructions. Will watch your condition. Will get help right away if you are not doing well or get worse.  Thank you for choosing an e-visit. Your e-visit answers were reviewed by a board certified advanced clinical practitioner to complete your personal  care plan. Depending upon the condition, your plan could have included both over the counter or prescription medications. Please review your pharmacy choice. Be sure that the pharmacy you have chosen is open so that you can pick up your prescription now.  If there is a problem you may message your provider in [place] to have the prescription routed to another pharmacy. Your safety is important to [place]. If you have drug allergies check your prescription carefully.  For the next 24 hours, you can use MyChart to ask questions about today's visit, request a non-urgent call back, or ask for a work or school excuse from your e-visit provider. You will get an email in the next two days asking about your experience. I hope that your e-visit has been valuable and will speed your recovery.

## 2016-05-31 ENCOUNTER — Other Ambulatory Visit: Payer: Self-pay

## 2016-05-31 DIAGNOSIS — C4491 Basal cell carcinoma of skin, unspecified: Secondary | ICD-10-CM

## 2016-05-31 DIAGNOSIS — C44319 Basal cell carcinoma of skin of other parts of face: Secondary | ICD-10-CM | POA: Diagnosis not present

## 2016-05-31 DIAGNOSIS — D229 Melanocytic nevi, unspecified: Secondary | ICD-10-CM | POA: Diagnosis not present

## 2016-05-31 DIAGNOSIS — L309 Dermatitis, unspecified: Secondary | ICD-10-CM | POA: Diagnosis not present

## 2016-05-31 HISTORY — DX: Basal cell carcinoma of skin, unspecified: C44.91

## 2016-06-27 ENCOUNTER — Other Ambulatory Visit: Payer: Self-pay | Admitting: Dermatology

## 2016-06-27 DIAGNOSIS — L988 Other specified disorders of the skin and subcutaneous tissue: Secondary | ICD-10-CM | POA: Diagnosis not present

## 2016-06-27 DIAGNOSIS — C44319 Basal cell carcinoma of skin of other parts of face: Secondary | ICD-10-CM | POA: Diagnosis not present

## 2016-07-19 DIAGNOSIS — H524 Presbyopia: Secondary | ICD-10-CM | POA: Diagnosis not present

## 2016-07-22 NOTE — Progress Notes (Signed)
Cardiology Office Note    Date:  07/24/2016   ID:  [name], DOB 29-Sep-1950, MRN [id number]  PCP:  [facility], FNP  Cardiologist:  Latash Nouri [place], MD    History of Present Illness:  Courtney [name] is a 66 y.o. female seen for follow up CAD.  She has a history of CAD. Presented in 2009 and had a BMS of the mid RCA. She states she had 2 stents. In 2011 she had a mildly abnormal stress test and repeat cardiac cath showed nonobstructive CAD. In 2014 she had a normal Myoview study. She also has a history of mild carotid disease, HTN, PVCs, and obesity. She is retired as a Retail buyer for [employer].   On follow up today she reports she is feeling well. She is still participating in  [employer] and has lost an additional 5 lbs. She denies any chest pain, SOB, palpitations, or edema. She enjoys ballroom dancing and doing yard work. She is walking more from 6-10,000 steps a day on Fit [employer]. She notes a couple of bouts of bronchitis when exposed to dust. Rare indigestion.    Past Medical History:  Diagnosis Date  . Aortic valve sclerosis    echo 59/7416, soft systolic murmur  . 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 Carotid artery disease ([place])    40-59% bilateral, doppler December 2010  . Coronary artery disease    a. BMS-mid RCA 11/2007 b. stress echo 02/2009 subtle inferior HK, lateral ST depressions with stress c. follow-up cath 02/2009: RCA stent patent, severe but stable stenosis of distal posterior lateral branch RCA. LV normal, med tx unless more sx c. ETT Myoview 08/27/12: negative for ischemia; EF 64%  . Depression    Mild, August, 2012  . Dyslipidemia   . Ejection fraction    EF normal, catheterization, 2011 /  EF 55%, echo, 2010  . Fluid overload    Mild, stable since hospitalization in the past  . GERD (gastroesophageal reflux disease)    occarionally, takes Pepcid over the counter  . Heart murmur    has, occasional PVC's  . Hyperlipidemia   . Hypertension   .  Myocardial infarction ([place])    2009  . PVC (premature ventricular contraction)    per prior Holter monitor  . Right hip pain 12/2009  . Shellfish allergy     Past Surgical History:  Procedure Laterality Date  . 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/01/2005  . CARDIAC CATHETERIZATION  1/211   Patent RCA stent, stenotic PLB supplying small distribution; medically managed  . CORONARY ANGIOPLASTY WITH STENT PLACEMENT  11/2007   BMS-mid RCA  . DIAGNOSTIC LAPAROSCOPY     1982 for endometriosis  . DILATION AND CURETTAGE OF UTERUS  2005  . hx of wisdom teeth surgery    . TOTAL HIP ARTHROPLASTY Right 07/08/2014   Procedure: RIGHT TOTAL HIP ARTHROPLASTY ANTERIOR APPROACH;  Surgeon: Mcarthur Rossetti, MD;  Location: [facility];  Service: Orthopedics;  Laterality: Right;    Current Medications: Outpatient Medications Prior to Visit  Medication Sig Dispense Refill  . acetaminophen (TYLENOL) 500 MG tablet Take 1,000 mg by mouth every 6 (six) hours as needed (Pain).     [name] aspirin 81 MG tablet Take 81 mg by mouth daily.    . Diclofenac Sodium 2 % SOLN Apply 1 application topically 2 (two) times daily as needed. Apply 1 pump twice daily. 112 g 3  . meclizine (ANTIVERT) 25 MG tablet  Take 1 tablet (25 mg total) by mouth 4 (four) times daily as needed for dizziness. 60 tablet 0  . nitroGLYCERIN (NITROSTAT) 0.4 MG SL tablet Place 1 tablet (0.4 mg total) under the tongue every 5 (five) minutes as needed for chest pain. 25 tablet 3  . omeprazole (PRILOSEC) 40 MG capsule TAKE 1 CAPSULE (40 MG TOTAL) BY MOUTH DAILY. 90 capsule 3  . metoprolol tartrate (LOPRESSOR) 25 MG tablet Take 0.5 tablets (12.5 mg total) by mouth 2 (two) times daily. 180 tablet 3  . ramipril (ALTACE) 5 MG capsule TAKE 1 CAPSULE BY MOUTH EVERY DAY 90 capsule 3  . rosuvastatin (CRESTOR) 40 MG tablet Take 1 tablet (40 mg total) by mouth daily. 90 tablet 3  . benzonatate (TESSALON) 100 MG capsule Take 1 capsule (100 mg total) by mouth 3 (three) times  daily as needed for cough. 30 capsule 0  . benzonatate (TESSALON) 100 MG capsule Take 1 capsule (100 mg total) by mouth 2 (two) times daily as needed for cough. 20 capsule 0  . predniSONE (DELTASONE) 10 MG tablet Take 1 tablet (10 mg total) by mouth as directed. sterapred 10mg  generic 21-dose taper 21 tablet 0  . predniSONE (STERAPRED UNI-PAK 21 TAB) 10 MG (21) TBPK tablet Take 1 tablet (10 mg total) by mouth daily. As directed 21 tablet 0   No facility-administered medications prior to visit.      Allergies:   Erythromycin; Penicillins; and Shellfish allergy   Social History   Social History  . Marital status: Divorced    Spouse name: N/A  . Number of children: 1  . Years of education: 55   Occupational History  . Retired - Retail buyer    Social History Main Topics  . Smoking status: Never Smoker  . Smokeless tobacco: Never Used  . Alcohol use 4.2 oz/week    7 Glasses of wine per week     Comment: 1 glass of wine/night  . Drug use: No  . Sexual activity: Yes   Other Topics Concern  . None   Social History Narrative   Fun: Dance, garden,    Denies religious beliefs effecting health care.   Denies abuse and feels safe at home     Family History:  The patient's family history includes Heart attack in her paternal grandfather; Heart attack (age of onset: 43) in her father; Heart disease in her sister; Stroke in her mother.   ROS:   Please see the history of present illness.    ROS All other systems reviewed and are negative.   PHYSICAL EXAM:   VS:  BP 110/74   Pulse (!) 55   Ht 5' (1.524 m)   Wt 172 lb 6.4 oz (78.2 kg)   BMI 33.67 kg/m    GEN: Well nourished, obese, in no acute distress  HEENT: normal  Neck: no JVD, carotid bruits, or masses Cardiac: RRR; soft gr 2/6 systolic murmur RUSB. no rubs, or gallops,no edema  Respiratory:  clear to auscultation bilaterally, normal work of breathing GI: soft, nontender, nondistended, + BS MS: no deformity or  atrophy  Skin: warm and dry, no rash Neuro:  Alert and Oriented x 3, Strength and sensation are intact Psych: euthymic mood, full affect  Wt Readings from Last 3 Encounters:  07/24/16 172 lb 6.4 oz (78.2 kg)  01/05/16 177 lb 12.8 oz (80.6 kg)  11/29/15 175 lb (79.4 kg)      Studies/Labs Reviewed:   EKG:  EKG is ordered  today.  The ekg ordered today demonstrates NSR with normal Ecg. I have personally reviewed and interpreted this study.   Recent Labs: 11/30/2015: ALT 20; BUN 19; Creatinine, Ser 0.80; Hemoglobin 12.9; Platelets 235.0; Potassium 4.5; Sodium 139   Lipid Panel    Component Value Date/Time   CHOL 183 11/30/2015 1028   TRIG 127.0 11/30/2015 1028   HDL 59.40 11/30/2015 1028   CHOLHDL 3 11/30/2015 1028   VLDL 25.4 11/30/2015 1028   LDLCALC 98 11/30/2015 1028    Additional studies/ records that were reviewed today include:  none    ASSESSMENT:    1. Coronary artery disease involving native coronary artery of native heart without angina pectoris   2. Pure hypercholesterolemia      PLAN:  In order of problems listed above:  1. Continue current medical therapy with ASA, statin, metoprolol and ramipril. Encourage continued weight loss and exercise. Will update stress Myoview since last was 4 years ago. 2. Last LDL 98. Goal 70. She is already on high dose statin. Discussed additional therapy with Zetia vs. PCSK 9 inhibitor. Will start Zetia 10 mg daily and repeat fasting lab in 2 months.    Medication Adjustments/Labs and Tests Ordered: Current medicines are reviewed at length with the patient today.  Concerns regarding medicines are outlined above.  Medication changes, Labs and Tests ordered today are listed in the Patient Instructions below. Patient Instructions  Continue your current therapy  We will add Zetia 10 mg daily for cholesterol   Repeat fasting lab in 2-3 months.  We will schedule you for a nuclear stress test     Signed, Amily Depp [place],  MD  07/24/2016 2:37 PM    [facility] [address] [place], [place], [address] [phone]

## 2016-07-24 ENCOUNTER — Encounter: Payer: Self-pay | Admitting: Cardiology

## 2016-07-24 ENCOUNTER — Ambulatory Visit (INDEPENDENT_AMBULATORY_CARE_PROVIDER_SITE_OTHER): Payer: Medicare HMO | Admitting: Cardiology

## 2016-07-24 ENCOUNTER — Other Ambulatory Visit: Payer: Self-pay | Admitting: Cardiology

## 2016-07-24 VITALS — BP 110/74 | HR 55 | Ht 60.0 in | Wt 172.4 lb

## 2016-07-24 DIAGNOSIS — E7800 Pure hypercholesterolemia, unspecified: Secondary | ICD-10-CM | POA: Diagnosis not present

## 2016-07-24 DIAGNOSIS — I2510 Atherosclerotic heart disease of native coronary artery without angina pectoris: Secondary | ICD-10-CM | POA: Diagnosis not present

## 2016-07-24 MED ORDER — EZETIMIBE 10 MG PO TABS: 10.0000 mg | ORAL_TABLET | Freq: Every day | ORAL | 3 refills | Status: DC

## 2016-07-24 MED ORDER — ROSUVASTATIN CALCIUM 40 MG PO TABS: 40.0000 mg | ORAL_TABLET | Freq: Every day | ORAL | 3 refills | Status: DC

## 2016-07-24 MED ORDER — METOPROLOL TARTRATE 25 MG PO TABS: 12.5000 mg | ORAL_TABLET | Freq: Two times a day (BID) | ORAL | 3 refills | Status: DC

## 2016-07-24 MED ORDER — RAMIPRIL 5 MG PO CAPS: ORAL_CAPSULE | ORAL | 3 refills | Status: DC

## 2016-07-24 NOTE — Patient Instructions (Addendum)
Continue your current therapy  We will add Zetia 10 mg daily for cholesterol   Repeat fasting lab in 2-3 months.  We will schedule you for a nuclear stress test

## 2016-07-25 ENCOUNTER — Other Ambulatory Visit: Payer: Self-pay

## 2016-07-25 MED ORDER — EZETIMIBE 10 MG PO TABS: 10.0000 mg | ORAL_TABLET | Freq: Every day | ORAL | 3 refills | Status: DC

## 2016-07-30 DIAGNOSIS — G4700 Insomnia, unspecified: Secondary | ICD-10-CM | POA: Diagnosis not present

## 2016-07-30 DIAGNOSIS — Z6834 Body mass index (BMI) 34.0-34.9, adult: Secondary | ICD-10-CM | POA: Diagnosis not present

## 2016-07-30 DIAGNOSIS — Z01419 Encounter for gynecological examination (general) (routine) without abnormal findings: Secondary | ICD-10-CM | POA: Diagnosis not present

## 2016-07-31 ENCOUNTER — Telehealth (HOSPITAL_COMMUNITY): Payer: Self-pay

## 2016-07-31 NOTE — Telephone Encounter (Signed)
Encounter complete. 

## 2016-08-01 ENCOUNTER — Encounter: Payer: Self-pay | Admitting: Family

## 2016-08-02 ENCOUNTER — Ambulatory Visit (HOSPITAL_COMMUNITY)
Admission: RE | Admit: 2016-08-02 | Discharge: 2016-08-02 | Disposition: A | Discharge status: Home / Self Care | Payer: Medicare HMO | Source: Ambulatory Visit | Attending: Cardiovascular Disease | Admitting: Cardiovascular Disease

## 2016-08-02 DIAGNOSIS — Z8249 Family history of ischemic heart disease and other diseases of the circulatory system: Secondary | ICD-10-CM | POA: Insufficient documentation

## 2016-08-02 DIAGNOSIS — E7800 Pure hypercholesterolemia, unspecified: Secondary | ICD-10-CM

## 2016-08-02 DIAGNOSIS — I7789 Other specified disorders of arteries and arterioles: Secondary | ICD-10-CM | POA: Diagnosis not present

## 2016-08-02 DIAGNOSIS — I10 Essential (primary) hypertension: Secondary | ICD-10-CM | POA: Insufficient documentation

## 2016-08-02 DIAGNOSIS — E785 Hyperlipidemia, unspecified: Secondary | ICD-10-CM | POA: Diagnosis not present

## 2016-08-02 DIAGNOSIS — I2510 Atherosclerotic heart disease of native coronary artery without angina pectoris: Secondary | ICD-10-CM | POA: Insufficient documentation

## 2016-08-02 DIAGNOSIS — R011 Cardiac murmur, unspecified: Secondary | ICD-10-CM | POA: Insufficient documentation

## 2016-08-02 DIAGNOSIS — K219 Gastro-esophageal reflux disease without esophagitis: Secondary | ICD-10-CM | POA: Insufficient documentation

## 2016-08-02 LAB — MYOCARDIAL PERFUSION IMAGING
CHL CUP NUCLEAR SRS: 0
CHL RATE OF PERCEIVED EXERTION: 18
CSEPEDS: 0 s
CSEPPHR: 153 {beats}/min
Estimated workload: 7 METS
Exercise duration (min): 6 min
LVDIAVOL: 74 mL (ref 46–106)
LVSYSVOL: 25 mL
MPHR: 153 {beats}/min
NUC STRESS TID: 1.18
Percent HR: 100 %
Rest HR: 55 {beats}/min
SDS: 0
SSS: 0

## 2016-08-02 MED ORDER — TECHNETIUM TC 99M TETROFOSMIN IV KIT
29.2000 | PACK | Freq: Once | INTRAVENOUS | Status: AC | PRN
  Administered 2016-08-02: 29.2 via INTRAVENOUS
  Filled 2016-08-02: qty 30

## 2016-08-02 MED ORDER — TECHNETIUM TC 99M TETROFOSMIN IV KIT
9.9000 | PACK | Freq: Once | INTRAVENOUS | Status: AC | PRN
Start: 2016-08-02 — End: 2016-08-02
  Administered 2016-08-02: 9.9 via INTRAVENOUS
  Filled 2016-08-02: qty 10

## 2016-10-29 DIAGNOSIS — I2510 Atherosclerotic heart disease of native coronary artery without angina pectoris: Secondary | ICD-10-CM | POA: Diagnosis not present

## 2016-10-29 DIAGNOSIS — E7800 Pure hypercholesterolemia, unspecified: Secondary | ICD-10-CM | POA: Diagnosis not present

## 2016-10-29 LAB — HEPATIC FUNCTION PANEL
ALT: 20 IU/L (ref 0–32)
AST: 23 IU/L (ref 0–40)
Albumin: 4.3 g/dL (ref 3.6–4.8)
Alkaline Phosphatase: 85 IU/L (ref 39–117)
Bilirubin, Direct: 0.07 mg/dL (ref 0.00–0.40)
Total Protein: 6.6 g/dL (ref 6.0–8.5)

## 2016-10-29 LAB — LIPID PANEL
CHOL/HDL RATIO: 2.5 ratio (ref 0.0–4.4)
Cholesterol, Total: 150 mg/dL (ref 100–199)
HDL: 60 mg/dL (ref >39)
LDL CALC: 73 mg/dL (ref 0–99)
TRIGLYCERIDES: 87 mg/dL (ref 0–149)
VLDL Cholesterol Cal: 17 mg/dL (ref 5–40)

## 2016-11-14 ENCOUNTER — Encounter: Payer: Self-pay | Admitting: Family

## 2016-11-14 ENCOUNTER — Ambulatory Visit (INDEPENDENT_AMBULATORY_CARE_PROVIDER_SITE_OTHER): Payer: Medicare HMO | Admitting: Family

## 2016-11-14 VITALS — BP 122/80 | HR 50 | Temp 98.1°F | Resp 16 | Ht 60.0 in | Wt 168.8 lb

## 2016-11-14 DIAGNOSIS — Z0000 Encounter for general adult medical examination without abnormal findings: Secondary | ICD-10-CM | POA: Diagnosis not present

## 2016-11-14 MED ORDER — ZOSTER VAC RECOMB ADJUVANTED 50 MCG/0.5ML IM SUSR: 0.5000 mL | Freq: Once | INTRAMUSCULAR | 1 refills | Status: AC

## 2016-11-14 NOTE — Assessment & Plan Note (Signed)
1) Anticipatory Guidance: Discussed importance of wearing a seatbelt while driving and not texting while driving; changing batteries in smoke detector at least once annually; wearing suntan lotion when outside; eating a balanced and moderate diet; getting physical activity at least 30 minutes per day.  2) Immunizations / Screenings / Labs:  Declines influenza at this time indicating she will get it and several weeks. Prescription for Shingrix provided. All other immunizations are up-to-date per recommendations. Colon, breast, and cervical cancer screenings are all up-to-date per recommendations. Bone mineral density has been tested. All other screenings are up-to-date per recommendations. Lipid profile previously evaluated by cardiology. Obtain basic metabolic profile and CBC.  Overall well exam with risk factors for cardiovascular disease including obesity, hyperlipidemia, and coronary artery disease. She has lost weight recently and continues to exercise through ballroom dancing with a showcase upcoming. Hyperlipidemia appears adequately controlled with current medication regimen no adverse side effects. Encouraged to continue healthy lifestyle behaviors and choices. Does express some difficulty with sleep. Discussed options and will continue current dosage of alprazolam as needed for sleep. Follow-up prevention exam in 1 year. Follow-up office visit as needed for chronic conditions.

## 2016-11-14 NOTE — Progress Notes (Signed)
Subjective:    Patient ID: Courtney [name], female    DOB: 1950-10-19, 66 y.o.   MRN: [id number]  Chief Complaint  Patient presents with  . CPE    not fasting, had recent blood work done    HPI:  Courtney [name] is a 66 y.o. female who presents today for a Medicare Annual Wellness/Physical exam.    1) Health Maintenance -   Diet - Averages about 2-3 meals per day using Weight Watchers to help with weight management; No caffeine intake.   Exercise - Ballroom dancing and walking twice per week and 5,000 steps per day   2) Preventative Exams / Immunizations:  Dental -- Up to date   Vision -- Up to date   Health Maintenance  Topic Date Due  . INFLUENZA VACCINE  05/18/2017 (Originally 09/18/2016)  . PAP SMEAR  07/30/2017  . MAMMOGRAM  03/26/2018  . COLONOSCOPY  02/23/2023  . TETANUS/TDAP  11/28/2025  . DEXA SCAN  Completed  . Hepatitis C Screening  Completed  . PNA vac Low Risk Adult  Completed     Immunization History  Administered Date(s) Administered  . Pneumococcal Conjugate-13 01/06/2015  . Pneumococcal Polysaccharide-23 11/29/2015  . Tdap 11/29/2015    RISK FACTORS  Tobacco History  Smoking Status  . Never Smoker  Smokeless Tobacco  . Never Used     Cardiac risk factors: advanced age (older than 4 for men, 75 for women), dyslipidemia and obesity (BMI >= 30 kg/m2).  Depression Screen  Depression screen [facility] 2/9 01/06/2015  Decreased Interest 0  Down, Depressed, Hopeless 0  PHQ - 2 Score 0     Activities of Daily Living In your present state of health, do you have any difficulty performing the following activities?:  Driving? No Managing money?  No Feeding yourself? No Getting from bed to chair? No Climbing a flight of stairs? No Preparing food and eating?: No Bathing or showering? No Getting dressed: No Getting to the toilet? No Using the toilet: No Moving around from place to place: No In the past year have you fallen or had a near  fall?:No   Home Safety Has smoke detector and wears seat belts. No firearms. No excess sun exposure. Are there smokers in your home (other than you)?  No Do you feel safe at home?  Yes  Hearing Difficulties: No Do you often ask people to speak up or repeat themselves? No Do you experience ringing or noises in your ears? No  Do you have difficulty understanding soft or whispered voices? No    Cognitive Testing  Alert? Yes   Normal Appearance? Yes  Oriented to person? Yes  Place? Yes   Time? Yes  Recall of three objects?  Yes  Can perform simple calculations? Yes  Displays appropriate judgment? Yes  Can read the correct time from a watch face? Yes  Do you feel that you have a problem with memory? No  Do you often misplace items? No   Advanced Directives have been discussed with the patient? Yes   Current Physicians/Providers and Suppliers  1. Terri Piedra, FNP - Internal Medicine 2. Peter Martinique, MD - Cardiology 3. Ena Dawley, MD - Gynecology  Indicate any recent Medical Services you may have received from other than [facility] providers in the past year (date may be approximate).  All answers were reviewed with the patient and necessary referrals were made:  Mauricio Po, [place]   11/14/2016    Allergies  Allergen Reactions  .  Erythromycin Hives  . Penicillins Hives  . Shellfish Allergy Hives     Outpatient Medications Prior to Visit  Medication Sig Dispense Refill  . acetaminophen (TYLENOL) 500 MG tablet Take 1,000 mg by mouth every 6 (six) hours as needed (Pain).     [name] aspirin 81 MG tablet Take 81 mg by mouth daily.    . Diclofenac Sodium 2 % SOLN Apply 1 application topically 2 (two) times daily as needed. Apply 1 pump twice daily. 112 g 3  . ezetimibe (ZETIA) 10 MG tablet Take 1 tablet (10 mg total) by mouth daily. 90 tablet 3  . metoprolol tartrate (LOPRESSOR) 25 MG tablet TAKE 1/2 TABLET TWICE DAILY 90 tablet 3  . nitroGLYCERIN (NITROSTAT) 0.4 MG SL tablet  Place 1 tablet (0.4 mg total) under the tongue every 5 (five) minutes as needed for chest pain. 25 tablet 3  . omeprazole (PRILOSEC) 40 MG capsule TAKE 1 CAPSULE (40 MG TOTAL) BY MOUTH DAILY. 90 capsule 3  . ramipril (ALTACE) 5 MG capsule TAKE 1 CAPSULE EVERY DAY 90 capsule 3  . rosuvastatin (CRESTOR) 40 MG tablet TAKE 1 TABLET DAILY. 90 tablet 3  . meclizine (ANTIVERT) 25 MG tablet Take 1 tablet (25 mg total) by mouth 4 (four) times daily as needed for dizziness. 60 tablet 0   No facility-administered medications prior to visit.      Past Medical History:  Diagnosis Date  . Aortic valve sclerosis    echo 96/7893, soft systolic murmur  . 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 Carotid artery disease ([place])    40-59% bilateral, doppler December 2010  . Coronary artery disease    a. BMS-mid RCA 11/2007 b. stress echo 02/2009 subtle inferior HK, lateral ST depressions with stress c. follow-up cath 02/2009: RCA stent patent, severe but stable stenosis of distal posterior lateral branch RCA. LV normal, med tx unless more sx c. ETT Myoview 08/27/12: negative for ischemia; EF 64%  . Depression    Mild, August, 2012  . Dyslipidemia   . Ejection fraction    EF normal, catheterization, 2011 /  EF 55%, echo, 2010  . Fluid overload    Mild, stable since hospitalization in the past  . GERD (gastroesophageal reflux disease)    occarionally, takes Pepcid over the counter  . Heart murmur    has, occasional PVC's  . Hyperlipidemia   . Hypertension   . Myocardial infarction ([place])    2009  . PVC (premature ventricular contraction)    per prior Holter monitor  . Right hip pain 12/2009  . Shellfish allergy      Past Surgical History:  Procedure Laterality Date  . 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/01/2005  . CARDIAC CATHETERIZATION  1/211   Patent RCA stent, stenotic PLB supplying small distribution; medically managed  . CORONARY ANGIOPLASTY WITH STENT PLACEMENT  11/2007   BMS-mid RCA  . DIAGNOSTIC LAPAROSCOPY      1982 for endometriosis  . DILATION AND CURETTAGE OF UTERUS  2005  . hx of wisdom teeth surgery    . SKIN CANCER EXCISION    . TOTAL HIP ARTHROPLASTY Right 07/08/2014   Procedure: RIGHT TOTAL HIP ARTHROPLASTY ANTERIOR APPROACH;  Surgeon: Mcarthur Rossetti, MD;  Location: [facility];  Service: Orthopedics;  Laterality: Right;     Family History  Problem Relation Age of Onset  . Heart attack Father 31  . Stroke Mother   . Heart attack Paternal Grandfather   . Heart disease Sister   .  BRCA 1/2 Neg Hx   . Breast cancer Neg Hx      Social History   Social History  . Marital status: Divorced    Spouse name: N/A  . Number of children: 1  . Years of education: 4   Occupational History  . Retired - Retail buyer    Social History Main Topics  . Smoking status: Never Smoker  . Smokeless tobacco: Never Used  . Alcohol use 4.2 oz/week    7 Glasses of wine per week     Comment: 1 glass of wine/night  . Drug use: No  . Sexual activity: Yes   Other Topics Concern  . Not on file   Social History Narrative   Fun: Dance, garden,    Denies religious beliefs effecting health care.   Denies abuse and feels safe at home     Review of Systems  Constitutional: Denies fever, chills, fatigue, or significant weight gain/loss. HENT: Head: Denies headache or neck pain Ears: Denies changes in hearing, ringing in ears, earache, drainage Nose: Denies discharge, stuffiness, itching, nosebleed, sinus pain Throat: Denies sore throat, hoarseness, dry mouth, sores, thrush Eyes: Denies loss/changes in vision, pain, redness, blurry/double vision, flashing lights Cardiovascular: Denies chest pain/discomfort, tightness, palpitations, shortness of breath with activity, difficulty lying down, swelling, sudden awakening with shortness of breath Respiratory: Denies shortness of breath, cough, sputum production, wheezing Gastrointestinal: Denies dysphasia, heartburn, change in appetite, nausea,  change in bowel habits, rectal bleeding, constipation, diarrhea, yellow skin or eyes Genitourinary: Denies frequency, urgency, burning/pain, blood in urine, incontinence, change in urinary strength. Musculoskeletal: Denies muscle/joint pain, stiffness, back pain, redness or swelling of joints, trauma Skin: Denies rashes, lumps, itching, dryness, color changes, or hair/nail changes Neurological: Denies dizziness, fainting, seizures, weakness, numbness, tingling, tremor Psychiatric - Denies nervousness, stress, depression or memory loss Endocrine: Denies heat or cold intolerance, sweating, frequent urination, excessive thirst, changes in appetite Hematologic: Denies ease of bruising or bleeding    Objective:     BP 122/80 (BP Location: Left Arm, Patient Position: Sitting, Cuff Size: Large)   Pulse (!) 50   Temp 98.1 F (36.7 C) (Oral)   Resp 16   Ht 5' (1.524 m)   Wt 168 lb 12.8 oz (76.6 kg)   SpO2 96%   BMI 32.97 kg/m  Nursing note and vital signs reviewed.  Physical Exam  Constitutional: She is oriented to person, place, and time. She appears well-developed and well-nourished.  HENT:  Head: Normocephalic.  Right Ear: Hearing, tympanic membrane, external ear and ear canal normal.  Left Ear: Hearing, tympanic membrane, external ear and ear canal normal.  Nose: Nose normal.  Mouth/Throat: Uvula is midline, oropharynx is clear and moist and mucous membranes are normal.  Eyes: Pupils are equal, round, and reactive to light. Conjunctivae and EOM are normal.  Neck: Neck supple. No JVD present. No tracheal deviation present. No thyromegaly present.  Cardiovascular: Normal rate, regular rhythm, normal heart sounds and intact distal pulses.   Pulmonary/Chest: Effort normal and breath sounds normal.  Abdominal: Soft. Bowel sounds are normal. She exhibits no distension and no mass. There is no tenderness. There is no rebound and no guarding.  Musculoskeletal: Normal range of motion. She  exhibits no edema or tenderness.  Lymphadenopathy:    She has no cervical adenopathy.  Neurological: She is alert and oriented to person, place, and time. She has normal reflexes. No cranial nerve deficit. She exhibits normal muscle tone. Coordination normal.  Skin: Skin is warm  and dry.  Psychiatric: She has a normal mood and affect. Her behavior is normal. Judgment and thought content normal.       Assessment & Plan:   During the course of the visit the patient was educated and counseled about appropriate screening and preventive services including:    Pneumococcal vaccine   Influenza vaccine  Td vaccine  Colorectal cancer screening  Diabetes screening  Glaucoma screening  Nutrition counseling   Diet review for nutrition referral? Yes ____  Not Indicated _X___   Patient Instructions (the written plan) was given to the patient.  Medicare Attestation I have personally reviewed: The patient's medical and social history Their use of alcohol, tobacco or illicit drugs Their current medications and supplements The patient's functional ability including ADLs,fall risks, home safety risks, cognitive, and hearing and visual impairment Diet and physical activities Evidence for depression or mood disorders  The patient's weight, height, BMI,  have been recorded in the chart.  I have made referrals, counseling, and provided education to the patient based on review of the above and I have provided the patient with a written personalized care plan for preventive services.     Problem List Items Addressed This Visit      Other   Routine general medical examination at a health care facility    1) Anticipatory Guidance: Discussed importance of wearing a seatbelt while driving and not texting while driving; changing batteries in smoke detector at least once annually; wearing suntan lotion when outside; eating a balanced and moderate diet; getting physical activity at least 30 minutes  per day.  2) Immunizations / Screenings / Labs:  Declines influenza at this time indicating she will get it and several weeks. Prescription for Shingrix provided. All other immunizations are up-to-date per recommendations. Colon, breast, and cervical cancer screenings are all up-to-date per recommendations. Bone mineral density has been tested. All other screenings are up-to-date per recommendations. Lipid profile previously evaluated by cardiology. Obtain basic metabolic profile and CBC.  Overall well exam with risk factors for cardiovascular disease including obesity, hyperlipidemia, and coronary artery disease. She has lost weight recently and continues to exercise through ballroom dancing with a showcase upcoming. Hyperlipidemia appears adequately controlled with current medication regimen no adverse side effects. Encouraged to continue healthy lifestyle behaviors and choices. Does express some difficulty with sleep. Discussed options and will continue current dosage of alprazolam as needed for sleep. Follow-up prevention exam in 1 year. Follow-up office visit as needed for chronic conditions.       Relevant Orders   Basic Metabolic Panel (BMET)   CBC   Medicare annual wellness visit, subsequent - Primary       I have discontinued Ms. Enyeart's meclizine. I am also having her start on Zoster Vac Recomb Adjuvanted. Additionally, I am having her maintain her acetaminophen, aspirin, Diclofenac Sodium, nitroGLYCERIN, omeprazole, metoprolol tartrate, ramipril, rosuvastatin, ezetimibe, Melatonin, and ALPRAZolam.   Meds ordered this encounter  Medications  . Melatonin 1 MG TABS    Sig: Take by mouth.  . ALPRAZolam (XANAX) 0.25 MG tablet    Sig: Take 0.25 mg by mouth at bedtime as needed for anxiety.  . Zoster Vac Recomb Adjuvanted [facility]) injection    Sig: Inject 0.5 mLs into the muscle once. Repeat in 2-6 months after first injection.    Dispense:  0.5 mL    Refill:  1    Order Specific  Question:   Supervising Provider    Answer:   Hoyt Koch [  [id number]]     Follow-up: Return in about 1 year (around 11/14/2017), or if symptoms worsen or fail to improve.   Mauricio Po, FNP

## 2016-11-14 NOTE — Patient Instructions (Signed)
Thank you for choosing [facility].  SUMMARY AND INSTRUCTIONS:  Overall doing very well.  Continue with the Xanax as needed for sleep.   Good luck in your showcase!  Labs:  Please stop by the lab on the lower level of the building for your blood work. Your results will be released to [facility] (or called to you) after review, usually within 72 hours after test completion. If any changes need to be made, you will be notified at that same time.  1.) The lab is open from 7:30am to 5:30 pm Monday-Friday 2.) No appointment is necessary 3.) Fasting (if needed) is 6-8 hours after food and drink; black coffee and water are okay   Follow up:  If your symptoms worsen or fail to improve, please contact our office for further instruction, or in case of emergency go directly to the emergency room at the closest medical facility.    Health Maintenance  Topic Date Due  . INFLUENZA VACCINE  05/18/2017 (Originally 09/18/2016)  . PAP SMEAR  07/30/2017  . MAMMOGRAM  03/26/2018  . COLONOSCOPY  02/23/2023  . TETANUS/TDAP  11/28/2025  . DEXA SCAN  Completed  . Hepatitis C Screening  Completed  . PNA vac Low Risk Adult  Completed   Health Maintenance, Female Adopting a healthy lifestyle and getting preventive care can go a long way to promote health and wellness. Talk with your health care provider about what schedule of regular examinations is right for you. This is a good chance for you to check in with your provider about disease prevention and staying healthy. In between checkups, there are plenty of things you can do on your own. Experts have done a lot of research about which lifestyle changes and preventive measures are most likely to keep you healthy. Ask your health care provider for more information. Weight and diet Eat a healthy diet  Be sure to include plenty of vegetables, fruits, low-fat dairy products, and lean protein.  Do not eat a lot of foods high in solid fats, added sugars,  or salt.  Get regular exercise. This is one of the most important things you can do for your health. ? Most adults should exercise for at least 150 minutes each week. The exercise should increase your heart rate and make you sweat (moderate-intensity exercise). ? Most adults should also do strengthening exercises at least twice a week. This is in addition to the moderate-intensity exercise.  Maintain a healthy weight  Body mass index (BMI) is a measurement that can be used to identify possible weight problems. It estimates body fat based on height and weight. Your health care provider can help determine your BMI and help you achieve or maintain a healthy weight.  For females 14 years of age and older: ? A BMI below 18.5 is considered underweight. ? A BMI of 18.5 to 24.9 is normal. ? A BMI of 25 to 29.9 is considered overweight. ? A BMI of 30 and above is considered obese.  Watch levels of cholesterol and blood lipids  You should start having your blood tested for lipids and cholesterol at 68 years of age, then have this test every 5 years.  You may need to have your cholesterol levels checked more often if: ? Your lipid or cholesterol levels are high. ? You are older than 68 years of age. ? You are at high risk for heart disease.  Cancer screening Lung Cancer  Lung cancer screening is recommended for adults 55-80 years  old who are at high risk for lung cancer because of a history of smoking.  A yearly low-dose CT scan of the lungs is recommended for people who: ? Currently smoke. ? Have quit within the past 15 years. ? Have at least a 30-pack-year history of smoking. A pack year is smoking an average of one pack of cigarettes a day for 1 year.  Yearly screening should continue until it has been 15 years since you quit.  Yearly screening should stop if you develop a health problem that would prevent you from having lung cancer treatment.  Breast Cancer  Practice breast  self-awareness. This means understanding how your breasts normally appear and feel.  It also means doing regular breast self-exams. Let your health care provider know about any changes, no matter how small.  If you are in your 20s or 30s, you should have a clinical breast exam (CBE) by a health care provider every 1-3 years as part of a regular health exam.  If you are 76 or older, have a CBE every year. Also consider having a breast X-ray (mammogram) every year.  If you have a family history of breast cancer, talk to your health care provider about genetic screening.  If you are at high risk for breast cancer, talk to your health care provider about having an MRI and a mammogram every year.  Breast cancer gene (BRCA) assessment is recommended for women who have family members with BRCA-related cancers. BRCA-related cancers include: ? Breast. ? Ovarian. ? Tubal. ? Peritoneal cancers.  Results of the assessment will determine the need for genetic counseling and BRCA1 and BRCA2 testing.  Cervical Cancer Your health care provider may recommend that you be screened regularly for cancer of the pelvic organs (ovaries, uterus, and vagina). This screening involves a pelvic examination, including checking for microscopic changes to the surface of your cervix (Pap test). You may be encouraged to have this screening done every 3 years, beginning at age 66.  For women ages 65-65, health care providers may recommend pelvic exams and Pap testing every 3 years, or they may recommend the Pap and pelvic exam, combined with testing for human papilloma virus (HPV), every 5 years. Some types of HPV increase your risk of cervical cancer. Testing for HPV may also be done on women of any age with unclear Pap test results.  Other health care providers may not recommend any screening for nonpregnant women who are considered low risk for pelvic cancer and who do not have symptoms. Ask your health care provider if a  screening pelvic exam is right for you.  If you have had past treatment for cervical cancer or a condition that could lead to cancer, you need Pap tests and screening for cancer for at least 20 years after your treatment. If Pap tests have been discontinued, your risk factors (such as having a new sexual partner) need to be reassessed to determine if screening should resume. Some women have medical problems that increase the chance of getting cervical cancer. In these cases, your health care provider may recommend more frequent screening and Pap tests.  Colorectal Cancer  This type of cancer can be detected and often prevented.  Routine colorectal cancer screening usually begins at 68 years of age and continues through 68 years of age.  Your health care provider may recommend screening at an earlier age if you have risk factors for colon cancer.  Your health care provider may also recommend using home test  kits to check for hidden blood in the stool.  A small camera at the end of a tube can be used to examine your colon directly (sigmoidoscopy or colonoscopy). This is done to check for the earliest forms of colorectal cancer.  Routine screening usually begins at age 6.  Direct examination of the colon should be repeated every 5-10 years through 68 years of age. However, you may need to be screened more often if early forms of precancerous polyps or small growths are found.  Skin Cancer  Check your skin from head to toe regularly.  Tell your health care provider about any new moles or changes in moles, especially if there is a change in a mole's shape or color.  Also tell your health care provider if you have a mole that is larger than the size of a pencil eraser.  Always use sunscreen. Apply sunscreen liberally and repeatedly throughout the day.  Protect yourself by wearing long sleeves, pants, a wide-brimmed hat, and sunglasses whenever you are outside.  Heart disease, diabetes, and  high blood pressure  High blood pressure causes heart disease and increases the risk of stroke. High blood pressure is more likely to develop in: ? People who have blood pressure in the high end of the normal range (130-139/85-89 mm Hg). ? People who are overweight or obese. ? People who are African American.  If you are 86-17 years of age, have your blood pressure checked every 3-5 years. If you are 6 years of age or older, have your blood pressure checked every year. You should have your blood pressure measured twice-once when you are at a hospital or clinic, and once when you are not at a hospital or clinic. Record the average of the two measurements. To check your blood pressure when you are not at a hospital or clinic, you can use: ? An automated blood pressure machine at a pharmacy. ? A home blood pressure monitor.  If you are between 37 years and 9 years old, ask your health care provider if you should take aspirin to prevent strokes.  Have regular diabetes screenings. This involves taking a blood sample to check your fasting blood sugar level. ? If you are at a normal weight and have a low risk for diabetes, have this test once every three years after 68 years of age. ? If you are overweight and have a high risk for diabetes, consider being tested at a younger age or more often. Preventing infection Hepatitis B  If you have a higher risk for hepatitis B, you should be screened for this virus. You are considered at high risk for hepatitis B if: ? You were born in a country where hepatitis B is common. Ask your health care provider which countries are considered high risk. ? Your parents were born in a high-risk country, and you have not been immunized against hepatitis B (hepatitis B vaccine). ? You have HIV or AIDS. ? You use needles to inject street drugs. ? You live with someone who has hepatitis B. ? You have had sex with someone who has hepatitis B. ? You get hemodialysis  treatment. ? You take certain medicines for conditions, including cancer, organ transplantation, and autoimmune conditions.  Hepatitis C  Blood testing is recommended for: ? Everyone born from [birth year] through 1967. ? Anyone with known risk factors for hepatitis C.  Sexually transmitted infections (STIs)  You should be screened for sexually transmitted infections (STIs) including gonorrhea and chlamydia if: ?  You are sexually active and are younger than 68 years of age. ? You are older than 68 years of age and your health care provider tells you that you are at risk for this type of infection. ? Your sexual activity has changed since you were last screened and you are at an increased risk for chlamydia or gonorrhea. Ask your health care provider if you are at risk.  If you do not have HIV, but are at risk, it may be recommended that you take a prescription medicine daily to prevent HIV infection. This is called pre-exposure prophylaxis (PrEP). You are considered at risk if: ? You are sexually active and do not regularly use condoms or know the HIV status of your partner(s). ? You take drugs by injection. ? You are sexually active with a partner who has HIV.  Talk with your health care provider about whether you are at high risk of being infected with HIV. If you choose to begin PrEP, you should first be tested for HIV. You should then be tested every 3 months for as long as you are taking PrEP. Pregnancy  If you are premenopausal and you may become pregnant, ask your health care provider about preconception counseling.  If you may become pregnant, take 400 to 800 micrograms (mcg) of folic acid every day.  If you want to prevent pregnancy, talk to your health care provider about birth control (contraception). Osteoporosis and menopause  Osteoporosis is a disease in which the bones lose minerals and strength with aging. This can result in serious bone fractures. Your risk for osteoporosis  can be identified using a bone density scan.  If you are 68 years of age or older, or if you are at risk for osteoporosis and fractures, ask your health care provider if you should be screened.  Ask your health care provider whether you should take a calcium or vitamin D supplement to lower your risk for osteoporosis.  Menopause may have certain physical symptoms and risks.  Hormone replacement therapy may reduce some of these symptoms and risks. Talk to your health care provider about whether hormone replacement therapy is right for you. Follow these instructions at home:  Schedule regular health, dental, and eye exams.  Stay current with your immunizations.  Do not use any tobacco products including cigarettes, chewing tobacco, or electronic cigarettes.  If you are pregnant, do not drink alcohol.  If you are breastfeeding, limit how much and how often you drink alcohol.  Limit alcohol intake to no more than 1 drink per day for nonpregnant women. One drink equals 12 ounces of beer, 5 ounces of wine, or 1 ounces of hard liquor.  Do not use street drugs.  Do not share needles.  Ask your health care provider for help if you need support or information about quitting drugs.  Tell your health care provider if you often feel depressed.  Tell your health care provider if you have ever been abused or do not feel safe at home. This information is not intended to replace advice given to you by your health care provider. Make sure you discuss any questions you have with your health care provider. Document Released: 08/20/2010 Document Revised: 07/13/2015 Document Reviewed: 11/08/2014 Elsevier Interactive Patient Education  [employer].

## 2016-12-03 ENCOUNTER — Ambulatory Visit (INDEPENDENT_AMBULATORY_CARE_PROVIDER_SITE_OTHER): Payer: Medicare HMO | Admitting: General Practice

## 2016-12-03 DIAGNOSIS — Z23 Encounter for immunization: Secondary | ICD-10-CM | POA: Diagnosis not present

## 2016-12-05 ENCOUNTER — Other Ambulatory Visit: Payer: Self-pay | Admitting: Family

## 2016-12-26 MED FILL — SHINGRIX VIAL KIT: 50 | 1 days supply | Qty: 1 | Fill #0

## 2017-02-21 ENCOUNTER — Other Ambulatory Visit: Payer: Self-pay | Admitting: Obstetrics and Gynecology

## 2017-02-21 DIAGNOSIS — Z139 Encounter for screening, unspecified: Secondary | ICD-10-CM

## 2017-02-24 ENCOUNTER — Other Ambulatory Visit: Payer: Self-pay | Admitting: Cardiology

## 2017-02-24 NOTE — Telephone Encounter (Signed)
REFILL 

## 2017-02-25 ENCOUNTER — Encounter: Payer: Self-pay | Admitting: Nurse Practitioner

## 2017-02-25 ENCOUNTER — Other Ambulatory Visit (INDEPENDENT_AMBULATORY_CARE_PROVIDER_SITE_OTHER): Payer: Medicare HMO

## 2017-02-25 ENCOUNTER — Ambulatory Visit (INDEPENDENT_AMBULATORY_CARE_PROVIDER_SITE_OTHER): Payer: Medicare HMO | Admitting: Nurse Practitioner

## 2017-02-25 VITALS — BP 136/72 | HR 52 | Temp 98.0°F | Resp 16 | Ht 60.0 in | Wt 172.0 lb

## 2017-02-25 DIAGNOSIS — R140 Abdominal distension (gaseous): Secondary | ICD-10-CM | POA: Diagnosis not present

## 2017-02-25 DIAGNOSIS — K2100 Gastro-esophageal reflux disease with esophagitis, without bleeding: Secondary | ICD-10-CM | POA: Insufficient documentation

## 2017-02-25 DIAGNOSIS — K602 Anal fissure, unspecified: Secondary | ICD-10-CM | POA: Insufficient documentation

## 2017-02-25 LAB — CBC
HCT: 38.1 % (ref 36.0–46.0)
HEMOGLOBIN: 13 g/dL (ref 12.0–15.0)
MCHC: 34.1 g/dL (ref 30.0–36.0)
MCV: 92.6 fl (ref 78.0–100.0)
Platelets: 226 10*3/uL (ref 150.0–400.0)
RBC: 4.12 Mil/uL (ref 3.87–5.11)
RDW: 12.9 % (ref 11.5–15.5)
WBC: 8.2 10*3/uL (ref 4.0–10.5)

## 2017-02-25 MED ORDER — OMEPRAZOLE 40 MG PO CPDR: 40.0000 mg | DELAYED_RELEASE_CAPSULE | Freq: Every day | ORAL | 3 refills | Status: DC

## 2017-02-25 MED FILL — SHINGRIX 50 MCG SUS: 50 | 1 days supply | Qty: 1 | Fill #1

## 2017-02-25 NOTE — Patient Instructions (Addendum)
Please head downstairs for lab work.  Dietary changes are the first step to decreasing gas:  Avoid chewing gum, drinking carbonated beverages, gulping liquids or eating too quickly.  Avoid Cabbage, onions, brussels sprouts, wheat, potatoes, beans, onions, celery,carrots, pretzels, bagels -foods that seem to cause your gas.  Continue over the counter anti gas pills.   If your symptoms do not improve with these changes, start to get worse, or if you start to have worsening abdominal pain, weight loss, fevers, vomiting,  diarrhea, blood in your stool.  Ill see you back around September for your annual physical, or sooner if you need me.  It was nice to meet you. Thanks for letting me take care of you today :)

## 2017-02-25 NOTE — Assessment & Plan Note (Signed)
She would like to stop prilosec, however she reports GERD symptoms on recent missed doses. We discussed continuing prilosec for now, especially with abdominal bloating and gas being a current problem We will consider stepping down her prilosec at a future visit. Medications ordered: - omeprazole (PRILOSEC) 40 MG capsule; Take 1 capsule (40 mg total) by mouth daily.  Dispense: 90 capsule; Refill: 3

## 2017-02-25 NOTE — Progress Notes (Signed)
Subjective:    Patient ID: Courtney [name], female    DOB: [birth year], 67 y.o.   MRN: [id number]  HPI Courtney [name] is a 67 yo Who presents today to establish care. She Is transferring to me from another provider in the same clinic. She follows with cardiology annually for management of CAD and hypercholesterolemia. She also has a history of osteoarthritis and acid reflux. She presents today for an acute complaint of gas and bloating.  This is a new problem. This problem has been ongoing for 6 months. This is her first encounter for this problem. She describes as abdominal bloating and abdominal pressure daily She has been trying to eat a healthy, high vegetable, high fiber diet. Many of the foods she eats- root vegetables, kale, cabbage seem to worsen the gas. She has been drinking fennel tea and using beano and anti-gas enzymes which have improved the symptoms Anti-gas enzymes, beano with some improvmenet She is following with Dr Starr Sinclair gastroenterology- for routine colonoscopies and GI care. She is maintained on prilosec 40 daily for acid reflux. She does notice increased indigestion if she does not take the prilosec. Overall she feels well. She does notice occasional blood on toilet paper after an episode of constipation.  Review of Systems  Constitutional: Negative for activity change, appetite change, fatigue, fever and unexpected weight change.  Respiratory: Negative for cough and shortness of breath.   Cardiovascular: Negative for chest pain.  Gastrointestinal: Positive for anal bleeding and constipation. Negative for diarrhea, nausea and vomiting.  Genitourinary: Negative for difficulty urinating, dysuria, vaginal bleeding and vaginal discharge.  Neurological: Negative for weakness and light-headedness.     Past Medical History:  Diagnosis Date  . Aortic valve sclerosis    echo 58/0998, soft systolic murmur  . 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 Carotid artery disease ([place])    40-59% bilateral, doppler December 2010  . Coronary artery disease    a. BMS-mid RCA 11/2007 b. stress echo 02/2009 subtle inferior HK, lateral ST depressions with stress c. follow-up cath 02/2009: RCA stent patent, severe but stable stenosis of distal posterior lateral branch RCA. LV normal, med tx unless more sx c. ETT Myoview 08/27/12: negative for ischemia; EF 64%  . Depression    Mild, August, 2012  . Dyslipidemia   . Ejection fraction    EF normal, catheterization, 2011 /  EF 55%, echo, 2010  . Fluid overload    Mild, stable since hospitalization in the past  . GERD (gastroesophageal reflux disease)    occarionally, takes Pepcid over the counter  . Heart murmur    has, occasional PVC's  . Hyperlipidemia   . Hypertension   . Myocardial infarction ([place])    2009  . PVC (premature ventricular contraction)    per prior Holter monitor  . Right hip pain 12/2009  . Shellfish allergy      Social History   Socioeconomic History  . Marital status: Divorced    Spouse name: Not on file  . Number of children: 1  . Years of education: 35  . Highest education level: Not on file  Social Needs  . Financial resource strain: Not on file  . Food insecurity - worry: Not on file  . Food insecurity - inability: Not on file  . Transportation needs - medical: Not on file  . Transportation needs - non-medical: Not on file  Occupational History  . Occupation: Retired - Retail buyer  Tobacco Use  . Smoking  status: Never Smoker  . Smokeless tobacco: Never Used  Substance and Sexual Activity  . Alcohol use: Yes    Alcohol/week: 4.2 oz    Types: 7 Glasses of wine per week    Comment: 1 glass of wine/night  . Drug use: No  . Sexual activity: Yes  Other Topics Concern  . Not on file  Social History Narrative   Fun: Dance, garden,    Denies religious beliefs effecting health care.   Denies abuse and feels safe at home    Past Surgical History:  Procedure Laterality Date  . BREAST  BIOPSY Left 08/01/2005  . carcinoid tumor removal    . CARDIAC CATHETERIZATION  1/211   Patent RCA stent, stenotic PLB supplying small distribution; medically managed  . CORONARY ANGIOPLASTY WITH STENT PLACEMENT  11/2007   BMS-mid RCA  . DIAGNOSTIC LAPAROSCOPY     1982 for endometriosis  . DILATION AND CURETTAGE OF UTERUS  2005  . hx of wisdom teeth surgery    . SKIN CANCER EXCISION    . TOTAL HIP ARTHROPLASTY Right 07/08/2014   Procedure: RIGHT TOTAL HIP ARTHROPLASTY ANTERIOR APPROACH;  Surgeon: Mcarthur Rossetti, MD;  Location: [facility];  Service: Orthopedics;  Laterality: Right;    Family History  Problem Relation Age of Onset  . Heart attack Father 44  . Stroke Mother   . Heart attack Paternal Grandfather   . Heart disease Sister   . BRCA 1/2 Neg Hx   . Breast cancer Neg Hx     Allergies  Allergen Reactions  . Erythromycin Hives  . Penicillins Hives  . Shellfish Allergy Hives    Current Outpatient Medications on File Prior to Visit  Medication Sig Dispense Refill  . acetaminophen (TYLENOL) 500 MG tablet Take 1,000 mg by mouth every 6 (six) hours as needed (Pain).     [name] ALPRAZolam (XANAX) 0.25 MG tablet Take 0.25 mg by mouth at bedtime as needed for anxiety.    [name] aspirin 81 MG tablet Take 81 mg by mouth daily.    . Diclofenac Sodium 2 % SOLN Apply 1 application topically 2 (two) times daily as needed. Apply 1 pump twice daily. 112 g 3  . ezetimibe (ZETIA) 10 MG tablet Take 1 tablet (10 mg total) by mouth daily. 90 tablet 3  . Melatonin 1 MG TABS Take by mouth.    . metoprolol tartrate (LOPRESSOR) 25 MG tablet TAKE 1/2 TABLET TWICE DAILY 90 tablet 3  . nitroGLYCERIN (NITROSTAT) 0.4 MG SL tablet Place 1 tablet (0.4 mg total) under the tongue every 5 (five) minutes as needed for chest pain. 25 tablet 3  . omeprazole (PRILOSEC) 40 MG capsule TAKE 1 CAPSULE (40 MG TOTAL) BY MOUTH DAILY. 90 capsule 3  . ramipril (ALTACE) 5 MG capsule TAKE 1 CAPSULE EVERY DAY 90 capsule 3  .  rosuvastatin (CRESTOR) 40 MG tablet TAKE 1 TABLET EVERY DAY 90 tablet 2   No current facility-administered medications on file prior to visit.     BP 136/72 (BP Location: Left Arm, Patient Position: Sitting, Cuff Size: Large)   Pulse (!) 52   Temp 98 F (36.7 C) (Oral)   Resp 16   Ht 5' (1.524 m)   Wt 172 lb (78 kg)   SpO2 97%   BMI 33.59 kg/m        Objective:   Physical Exam  Constitutional: She is oriented to person, place, and time. She appears well-developed and well-nourished. No distress.  HENT:  Head: Normocephalic and atraumatic.  Neck: Neck supple.  Cardiovascular: Normal rate, regular rhythm and intact distal pulses.  Pulmonary/Chest: Effort normal and breath sounds normal.  Abdominal: Soft. She exhibits no distension and no ascites. There is no hepatosplenomegaly. There is no tenderness. There is no CVA tenderness.  Musculoskeletal: She exhibits no edema.  Lymphadenopathy:    She has no cervical adenopathy.  Neurological: She is alert and oriented to person, place, and time. Coordination normal.  Skin: Skin is warm and dry.  No jaundice. No pallor.  Psychiatric: She has a normal mood and affect. Judgment and thought content normal.  Vitals reviewed.  Wt Readings from Last 3 Encounters:  02/25/17 172 lb (78 kg)  11/14/16 168 lb 12.8 oz (76.6 kg)  08/02/16 172 lb (78 kg)       Assessment & Plan:  RTC for CPE or sooner if needed.  Bloating History consistent with intestinal gas. No alarming findings on history and physical exam. We discussed lifestyle and dietary modification as first step in management. she's had some relief with anti gas enzymes, so she will continue. She agrees to plan of care. Diagnostic testing ordered: - CBC; Future Return precautions given.

## 2017-03-27 ENCOUNTER — Ambulatory Visit
Admission: RE | Admit: 2017-03-27 | Discharge: 2017-03-27 | Disposition: A | Discharge status: Home / Self Care | Payer: Medicare HMO | Source: Ambulatory Visit | Attending: Obstetrics and Gynecology | Admitting: Obstetrics and Gynecology

## 2017-03-27 DIAGNOSIS — Z139 Encounter for screening, unspecified: Secondary | ICD-10-CM

## 2017-03-27 DIAGNOSIS — Z1231 Encounter for screening mammogram for malignant neoplasm of breast: Secondary | ICD-10-CM | POA: Diagnosis not present

## 2017-03-31 ENCOUNTER — Other Ambulatory Visit: Payer: Self-pay | Admitting: Obstetrics and Gynecology

## 2017-03-31 DIAGNOSIS — R928 Other abnormal and inconclusive findings on diagnostic imaging of breast: Secondary | ICD-10-CM

## 2017-04-04 ENCOUNTER — Ambulatory Visit
Admission: RE | Admit: 2017-04-04 | Discharge: 2017-04-04 | Disposition: A | Discharge status: Home / Self Care | Payer: Medicare HMO | Source: Ambulatory Visit | Attending: Obstetrics and Gynecology | Admitting: Obstetrics and Gynecology

## 2017-04-04 ENCOUNTER — Other Ambulatory Visit: Payer: Self-pay | Admitting: Obstetrics and Gynecology

## 2017-04-04 DIAGNOSIS — R928 Other abnormal and inconclusive findings on diagnostic imaging of breast: Secondary | ICD-10-CM

## 2017-04-04 DIAGNOSIS — R922 Inconclusive mammogram: Secondary | ICD-10-CM | POA: Diagnosis not present

## 2017-04-04 DIAGNOSIS — N6320 Unspecified lump in the left breast, unspecified quadrant: Secondary | ICD-10-CM | POA: Diagnosis not present

## 2017-04-24 DIAGNOSIS — H5203 Hypermetropia, bilateral: Secondary | ICD-10-CM | POA: Diagnosis not present

## 2017-06-12 ENCOUNTER — Encounter (INDEPENDENT_AMBULATORY_CARE_PROVIDER_SITE_OTHER): Payer: Medicare HMO

## 2017-06-16 ENCOUNTER — Encounter (INDEPENDENT_AMBULATORY_CARE_PROVIDER_SITE_OTHER): Payer: Self-pay | Admitting: Family Medicine

## 2017-06-16 ENCOUNTER — Ambulatory Visit (INDEPENDENT_AMBULATORY_CARE_PROVIDER_SITE_OTHER): Payer: Medicare HMO | Admitting: Family Medicine

## 2017-06-16 VITALS — BP 144/63 | HR 55 | Temp 97.9°F | Ht 60.0 in | Wt 157.0 lb

## 2017-06-16 DIAGNOSIS — E538 Deficiency of other specified B group vitamins: Secondary | ICD-10-CM

## 2017-06-16 DIAGNOSIS — I10 Essential (primary) hypertension: Secondary | ICD-10-CM | POA: Diagnosis not present

## 2017-06-16 DIAGNOSIS — E782 Mixed hyperlipidemia: Secondary | ICD-10-CM

## 2017-06-16 DIAGNOSIS — Z6830 Body mass index (BMI) 30.0-30.9, adult: Secondary | ICD-10-CM | POA: Diagnosis not present

## 2017-06-16 DIAGNOSIS — E162 Hypoglycemia, unspecified: Secondary | ICD-10-CM | POA: Diagnosis not present

## 2017-06-16 DIAGNOSIS — Z1331 Encounter for screening for depression: Secondary | ICD-10-CM

## 2017-06-16 DIAGNOSIS — R5383 Other fatigue: Secondary | ICD-10-CM

## 2017-06-16 DIAGNOSIS — E669 Obesity, unspecified: Secondary | ICD-10-CM

## 2017-06-16 DIAGNOSIS — E559 Vitamin D deficiency, unspecified: Secondary | ICD-10-CM | POA: Diagnosis not present

## 2017-06-16 DIAGNOSIS — Z0289 Encounter for other administrative examinations: Secondary | ICD-10-CM

## 2017-06-17 LAB — COMPREHENSIVE METABOLIC PANEL
ALT: 20 IU/L (ref 0–32)
AST: 27 IU/L (ref 0–40)
Albumin/Globulin Ratio: 2 (ref 1.2–2.2)
Albumin: 4.7 g/dL (ref 3.6–4.8)
Alkaline Phosphatase: 88 IU/L (ref 39–117)
BUN/Creatinine Ratio: 26 (ref 12–28)
BUN: 22 mg/dL (ref 8–27)
Bilirubin Total: 0.3 mg/dL (ref 0.0–1.2)
CALCIUM: 9.6 mg/dL (ref 8.7–10.3)
CHLORIDE: 103 mmol/L (ref 96–106)
CO2: 24 mmol/L (ref 20–29)
CREATININE: 0.86 mg/dL (ref 0.57–1.00)
GFR, EST AFRICAN AMERICAN: 80 mL/min/{1.73_m2} (ref >59)
GFR, EST NON AFRICAN AMERICAN: 70 mL/min/{1.73_m2} (ref >59)
GLUCOSE: 86 mg/dL (ref 65–99)
Globulin, Total: 2.4 g/dL (ref 1.5–4.5)
Potassium: 4.9 mmol/L (ref 3.5–5.2)
Sodium: 141 mmol/L (ref 134–144)
TOTAL PROTEIN: 7.1 g/dL (ref 6.0–8.5)

## 2017-06-17 LAB — TSH: TSH: 0.931 u[IU]/mL (ref 0.450–4.500)

## 2017-06-17 LAB — INSULIN, RANDOM: INSULIN: 6.4 u[IU]/mL (ref 2.6–24.9)

## 2017-06-17 LAB — LIPID PANEL WITH LDL/HDL RATIO
Cholesterol, Total: 161 mg/dL (ref 100–199)
HDL: 61 mg/dL (ref >39)
LDL Calculated: 74 mg/dL (ref 0–99)
LDL/HDL RATIO: 1.2 ratio (ref 0.0–3.2)
Triglycerides: 128 mg/dL (ref 0–149)
VLDL Cholesterol Cal: 26 mg/dL (ref 5–40)

## 2017-06-17 LAB — HEMOGLOBIN A1C
Est. average glucose Bld gHb Est-mCnc: 114 mg/dL
Hgb A1c MFr Bld: 5.6 % (ref 4.8–5.6)

## 2017-06-17 LAB — T4, FREE: FREE T4: 1.14 ng/dL (ref 0.82–1.77)

## 2017-06-17 LAB — T3: T3, Total: 123 ng/dL (ref 71–180)

## 2017-06-17 LAB — VITAMIN D 25 HYDROXY (VIT D DEFICIENCY, FRACTURES): Vit D, 25-Hydroxy: 37.1 ng/mL (ref 30.0–100.0)

## 2017-06-17 LAB — VITAMIN B12: Vitamin B-12: 332 pg/mL (ref 232–1245)

## 2017-06-18 NOTE — Progress Notes (Signed)
.  Office: [phone]  /  Fax: [phone]   HPI:   Chief Complaint: Courtney [name] (MR# [id number]) is a 67 y.o. female who presents on 06/16/2017 for obesity evaluation and treatment. Current BMI is Body mass index is 30.66 kg/m.[name] Courtney [name] has struggled with obesity for years and has been unsuccessful in either losing weight or maintaining long term weight loss. Courtney [name] attended our information session and states she is currently in the action stage of change and ready to dedicate time achieving and maintaining a healthier weight.   Courtney [name] states her family eats meals together she thinks her family will eat healthier with  her her desired weight loss is 17 lbs she has been heavy most of her life she started gaining weight after marriage in 1980's her heaviest weight ever was 183 lbs she has significant food cravings issues  she snacks frequently in the evenings she wakes up frquently in the middle of the night to eat she skips meals frequently she is frequently drinking liquids with calories she struggles with emotional eating    Fatigue Courtney [name] feels her energy is lower than it should be. This has worsened with weight gain and has not worsened recently. Courtney [name] admits to daytime somnolence and  denies waking up still tired. Patient is at risk for obstructive sleep apnea. Patent has a history of symptoms of daytime fatigue. Patient generally gets 6 hours of sleep per night, and states they generally have nightime awakenings. Snoring is present. Apneic episodes are not present. Epworth Sleepiness Score is 6.  Hyperglycemia Courtney [name] has a history of some elevated fasting blood glucose readings in the past without a diagnosis of diabetes. She admits to polyphagia.  Vitamin D Deficiency Courtney [name] has a diagnosis of vitamin D deficiency. She is not on Vit D and has a history of vitamin D deficiency. No recent labs. She notes fatigue and denies nausea, vomiting or muscle weakness.  Vitamin B12  Deficiency Courtney [name] has a diagnosis of B12 insufficiency and notes fatigue. She is on B12 supplement, no recent labs. She notes fatigue. She is not a vegetarian and does not have a previous diagnosis of pernicious anemia. She does not have a history of weight loss surgery.   Hyperlipidemia Courtney [name] has hyperlipidemia and has been trying to improve her cholesterol levels with intensive lifestyle modification including a low saturated fat diet, exercise and weight loss. She is on Crestor and Zetia. She denies any chest pain, claudication or myalgias.  Hypertension Courtney [name] is a 66 y.o. female with hypertension. She is on ramipril and metoprolol. Courtney [name]'s blood pressure is elevated today, didn't take medications yet. She denies chest pain, has a history of coronary artery disease. She is working weight loss to help control her blood pressure with the goal of decreasing her risk of heart attack and stroke. Courtney [name]'s blood pressure is not currently controlled.  Depression Screen Courtney [name]'s Food and Mood (modified PHQ-9) score was  Depression screen PHQ 2/9 06/16/2017  Decreased Interest 1  Down, Depressed, Hopeless 1  PHQ - 2 Score 2  Altered sleeping 0  Tired, decreased energy 1  Change in appetite 1  Feeling bad or failure about yourself  1  Trouble concentrating 0  Moving slowly or fidgety/restless 0  Suicidal thoughts 0  PHQ-9 Score 5  Difficult doing work/chores Not difficult at all    ALLERGIES: Allergies  Allergen Reactions  . Erythromycin Hives  . Penicillins Hives  . Shellfish Allergy Hives    MEDICATIONS:  Current Outpatient Medications on File Prior to Visit  Medication Sig Dispense Refill  . acetaminophen (TYLENOL) 500 MG tablet Take 1,000 mg by mouth every 6 (six) hours as needed (Pain).     . Alpha-D-Galactosidase (BEANO) TABS Take 1 tablet by mouth daily.    . Alpha-D-Galactosidase (GAS-X PREVENTION PO) Take 1 capsule by mouth daily.    [name] aspirin 81 MG tablet Take 81 mg by  mouth daily.    . Digestive Enzymes CAPS Take 1 capsule by mouth daily.    [name] ezetimibe (ZETIA) 10 MG tablet Take 1 tablet (10 mg total) by mouth daily. 90 tablet 3  . metoprolol tartrate (LOPRESSOR) 25 MG tablet TAKE 1/2 TABLET TWICE DAILY 90 tablet 3  . nitroGLYCERIN (NITROSTAT) 0.4 MG SL tablet Place 1 tablet (0.4 mg total) under the tongue every 5 (five) minutes as needed for chest pain. 25 tablet 3  . omeprazole (PRILOSEC) 40 MG capsule Take 1 capsule (40 mg total) by mouth daily. 90 capsule 3  . ramipril (ALTACE) 5 MG capsule TAKE 1 CAPSULE EVERY DAY 90 capsule 3  . rosuvastatin (CRESTOR) 40 MG tablet TAKE 1 TABLET EVERY DAY 90 tablet 2   No current facility-administered medications on file prior to visit.     PAST MEDICAL HISTORY: Past Medical History:  Diagnosis Date  . Aortic valve sclerosis    echo 66/0630, soft systolic murmur  . 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 Back pain   . Carotid artery disease ([place])    40-59% bilateral, doppler December 2010  . Constipation   . Coronary artery disease    a. BMS-mid RCA 11/2007 b. stress echo 02/2009 subtle inferior HK, lateral ST depressions with stress c. follow-up cath 02/2009: RCA stent patent, severe but stable stenosis of distal posterior lateral branch RCA. LV normal, med tx unless more sx c. ETT Myoview 08/27/12: negative for ischemia; EF 64%  . Decreased hearing   . Depression    Mild, August, 2012  . Dyslipidemia   . Easy bruising   . Ejection fraction    EF normal, catheterization, 2011 /  EF 55%, echo, 2010  . Fluid overload    Mild, stable since hospitalization in the past  . GERD (gastroesophageal reflux disease)    occarionally, takes Pepcid over the counter  . Heart murmur    has, occasional PVC's  . History of right hip replacement   . Hyperlipidemia   . Hypertension   . Joint pain   . Myocardial infarction ([place])    2009  . Palpitations   . PVC (premature ventricular contraction)    per prior Holter monitor  .  Right hip pain 12/2009  . Shellfish allergy   . Trouble in sleeping   . Vitamin B12 deficiency   . Vitamin D deficiency     PAST SURGICAL HISTORY: Past Surgical History:  Procedure Laterality Date  . 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/01/2005  . carcinoid tumor removal    . CARDIAC CATHETERIZATION  1/211   Patent RCA stent, stenotic PLB supplying small distribution; medically managed  . CORONARY ANGIOPLASTY WITH STENT PLACEMENT  11/2007   BMS-mid RCA  . DIAGNOSTIC LAPAROSCOPY     1982 for endometriosis  . DILATION AND CURETTAGE OF UTERUS  2005  . hx of wisdom teeth surgery    . SKIN CANCER EXCISION    . TOTAL HIP ARTHROPLASTY Right 07/08/2014   Procedure: RIGHT TOTAL HIP ARTHROPLASTY ANTERIOR APPROACH;  Surgeon: Mcarthur Rossetti, MD;  Location: [facility];  Service: Orthopedics;  Laterality: Right;    SOCIAL HISTORY: Social History   Tobacco Use  . Smoking status: Never Smoker  . Smokeless tobacco: Never Used  Substance Use Topics  . Alcohol use: Yes    Alcohol/week: 4.2 oz    Types: 7 Glasses of wine per week    Comment: 1 glass of wine/night  . Drug use: No    FAMILY HISTORY: Family History  Problem Relation Age of Onset  . Heart attack Father 40  . Hyperlipidemia Father   . Hypertension Father   . Sudden death Father   . Stroke Mother   . Hyperlipidemia Mother   . Hypertension Mother   . Heart disease Mother   . Obesity Mother   . Heart attack Paternal Grandfather   . Heart disease Sister   . BRCA 1/2 Neg Hx   . Breast cancer Neg Hx     ROS: Review of Systems  Constitutional: Positive for malaise/fatigue. Negative for weight loss.       + Trouble sleeping  HENT:       + Decreased hearing  Eyes:       + Wear glasses or contacts  Cardiovascular: Negative for chest pain and claudication.       + Restless legs at night  Gastrointestinal: Negative for nausea and vomiting.  Musculoskeletal: Positive for back pain. Negative for myalgias.       Negative muscle  weakness  Endo/Heme/Allergies: Bruises/bleeds easily.       Positive polyphagia    PHYSICAL EXAM: Blood pressure (!) 144/63, pulse (!) 55, temperature 97.9 F (36.6 C), temperature source Oral, height 5' (1.524 m), weight 157 lb (71.2 kg), SpO2 98 %. Body mass index is 30.66 kg/m. Physical Exam  Constitutional: She is oriented to person, place, and time. She appears well-developed and well-nourished.  HENT:  Head: Normocephalic and atraumatic.  Nose: Nose normal.  Eyes: EOM are normal. No scleral icterus.  Neck: Normal range of motion. Neck supple. No thyromegaly present.  Cardiovascular: Normal rate.  Murmur (1/6 early systolic murmur) heard. Pulmonary/Chest: Effort normal. No respiratory distress.  Abdominal: Soft. There is no tenderness.  + Obesity  Musculoskeletal:  Range of Motion normal in all 4 extremities Trace edema noted in bilateral lower extremities  Neurological: She is alert and oriented to person, place, and time. Coordination normal.  Skin: Skin is warm and dry.  Psychiatric: She has a normal mood and affect. Her behavior is normal.  Vitals reviewed.   RECENT LABS AND TESTS: BMET    Component Value Date/Time   NA 141 06/16/2017 0000   K 4.9 06/16/2017 0000   CL 103 06/16/2017 0000   CO2 24 06/16/2017 0000   GLUCOSE 86 06/16/2017 0000   GLUCOSE 96 11/30/2015 1028   BUN 22 06/16/2017 0000   CREATININE 0.86 06/16/2017 0000   CALCIUM 9.6 06/16/2017 0000   GFRNONAA 70 06/16/2017 0000   GFRAA 80 06/16/2017 0000   Lab Results  Component Value Date   HGBA1C 5.6 06/16/2017   Lab Results  Component Value Date   INSULIN 6.4 06/16/2017   CBC    Component Value Date/Time   WBC 8.2 02/25/2017 1055   RBC 4.12 02/25/2017 1055   HGB 13.0 02/25/2017 1055   HCT 38.1 02/25/2017 1055   PLT 226.0 02/25/2017 1055   MCV 92.6 02/25/2017 1055   MCH 30.8 07/10/2014 0440   MCHC 34.1 02/25/2017 1055   RDW 12.9 02/25/2017 1055  LYMPHSABS 2.5 03/10/2009 0922    MONOABS 0.4 03/10/2009 0922   EOSABS 0.2 03/10/2009 0922   BASOSABS 0.0 03/10/2009 0922   Iron/TIBC/Ferritin/ %Sat    Component Value Date/Time   IRON 62 01/09/2015 0806   FERRITIN 26.6 01/09/2015 0806   IRONPCTSAT 15.2 (L) 01/09/2015 0806   Lipid Panel     Component Value Date/Time   CHOL 161 06/16/2017 0000   TRIG 128 06/16/2017 0000   HDL 61 06/16/2017 0000   CHOLHDL 2.5 10/29/2016 1030   CHOLHDL 3 11/30/2015 1028   VLDL 25.4 11/30/2015 1028   LDLCALC 74 06/16/2017 0000   Hepatic Function Panel     Component Value Date/Time   PROT 7.1 06/16/2017 0000   ALBUMIN 4.7 06/16/2017 0000   AST 27 06/16/2017 0000   ALT 20 06/16/2017 0000   ALKPHOS 88 06/16/2017 0000   BILITOT 0.3 06/16/2017 0000   BILIDIR 0.07 10/29/2016 1030      Component Value Date/Time   TSH 0.931 06/16/2017 0000   Vitamin D No recent labs  ECG  shows NSR with a rate of 60 BPM INDIRECT CALORIMETER done today shows a VO2 of 160 and a REE of 1112. Her calculated basal metabolic rate is 8315 thus her basal metabolic rate is worse than expected.    ASSESSMENT AND PLAN: Other fatigue - Plan: EKG 12-Lead, TSH, T4, free, T3  Hypoglycemia - Plan: Hemoglobin A1c, Insulin, random  Vitamin D deficiency - Plan: VITAMIN D 25 Hydroxy (Vit-D Deficiency, Fractures)  Vitamin B 12 deficiency - Plan: Vitamin B12  Mixed hyperlipidemia - Plan: Lipid Panel With LDL/HDL Ratio  Essential hypertension - Plan: Comprehensive metabolic panel  Depression screening  Class 1 obesity with serious comorbidity and body mass index (BMI) of 30.0 to 30.9 in adult, unspecified obesity type  PLAN:  Fatigue Courtney [name] was informed that her fatigue may be related to obesity, depression or many other causes. Labs will be ordered, and in the meanwhile Courtney [name] has agreed to work on diet, exercise and weight loss to help with fatigue. Proper sleep hygiene was discussed including the need for 7-8 hours of quality sleep each night. A sleep  study was not ordered based on symptoms and Epworth score.  Hyperglycemia Fasting labs will be obtained and results with be discussed with Courtney [name] in 2 weeks at her follow up visit. In the meanwhile Courtney [name] was started on a lower simple carbohydrate diet and will work on weight loss efforts. Courtney [name] agrees to follow up with our clinic in 2 weeks.  Vitamin D Deficiency Courtney [name] was informed that low vitamin D levels contributes to fatigue and are associated with obesity, breast, and colon cancer. She will follow up for routine testing of vitamin D, at least 2-3 times per year. She was informed of the risk of over-replacement of vitamin D and agrees to not increase her dose unless she discusses this with [place] first. We will check labs and Courtney [name] agrees to follow up with our clinic in 2 weeks.  Vitamin B12 Deficiency Courtney [name] will work on increasing B12 rich foods in her diet. B12 supplementation was not prescribed today. We will check labs and Courtney [name] agrees to follow up with our clinic in 2 weeks.  Hyperlipidemia Courtney [name] was informed of the American Heart Association Guidelines emphasizing intensive lifestyle modifications as the first line treatment for hyperlipidemia. We discussed many lifestyle modifications today in depth, and Courtney [name] will continue to work on decreasing saturated fats such as fatty red meat, butter and many fried  foods. She will start diet and also increase vegetables and lean protein in her diet and continue to work on exercise and weight loss efforts. We will check labs and Courtney [name] agrees to follow up with our clinic in 2 weeks.  Hypertension We discussed sodium restriction, working on healthy weight loss, and a regular exercise program as the means to achieve improved blood pressure control. Courtney [name] agreed with this plan and agreed to follow up as directed. We will continue to monitor her blood pressure as well as her progress with the above lifestyle modifications. She will start diet prescription and  will continue her medications as prescribed and will watch for signs of hypotension as she continues her lifestyle modifications. We will check labs and Courtney [name] agrees to follow up with our clinic in 2 weeks and we will recheck blood pressure at that time.  Depression Screen Courtney [name] had a mildly positive depression screening. Depression is commonly associated with obesity and often results in emotional eating behaviors. We will monitor this closely and work on CBT to help improve the non-hunger eating patterns. Referral to Psychology may be required if no improvement is seen as she continues in our clinic.  Obesity Courtney [name] is currently in the action stage of change and her goal is to continue with weight loss efforts She has agreed to follow the Category 1 plan Lilianne has been instructed to work up to a goal of 150 minutes of combined cardio and strengthening exercise per week for weight loss and overall health benefits. We discussed the following Behavioral Modification Strategies today: increasing lean protein intake, decreasing simple carbohydrates  and work on meal planning and easy cooking plans  Kylar has agreed to follow up with our clinic in 2 weeks. She was informed of the importance of frequent follow up visits to maximize her success with intensive lifestyle modifications for her multiple health conditions. She was informed we would discuss her lab results at her next visit unless there is a critical issue that needs to be addressed sooner. Dayannara agreed to keep her next visit at the agreed upon time to discuss these results.    OBESITY BEHAVIORAL INTERVENTION VISIT  Today's visit was # 1 out of 22.  Starting weight: 157 lbs Starting date: 06/16/17 Today's weight : 157 lbs Today's date: 06/16/2017 Total lbs lost to date: 0 (Patients must lose 7 lbs in the first 6 months to continue with counseling)   ASK: We discussed the diagnosis of obesity with [name] today and Cordie agreed to  give [place] permission to discuss obesity behavioral modification therapy today.  ASSESS: Tykerria has the diagnosis of obesity and her BMI today is 30.66 Jesslynn is in the action stage of change   ADVISE: Atlas was educated on the multiple health risks of obesity as well as the benefit of weight loss to improve her health. She was advised of the need for long term treatment and the importance of lifestyle modifications.  AGREE: Multiple dietary modification options and treatment options were discussed and  Ouida agreed to the above obesity treatment plan.   I, Trixie Dredge, am acting as transcriptionist for Dennard Nip, MD   I have reviewed the above documentation for accuracy and completeness, and I agree with the above. -Dennard Nip, MD

## 2017-06-30 ENCOUNTER — Ambulatory Visit (INDEPENDENT_AMBULATORY_CARE_PROVIDER_SITE_OTHER): Payer: Medicare HMO | Admitting: Family Medicine

## 2017-06-30 ENCOUNTER — Other Ambulatory Visit (INDEPENDENT_AMBULATORY_CARE_PROVIDER_SITE_OTHER): Payer: Self-pay | Admitting: Family Medicine

## 2017-06-30 VITALS — BP 133/73 | HR 49 | Temp 98.0°F | Ht 60.0 in | Wt 154.0 lb

## 2017-06-30 DIAGNOSIS — E669 Obesity, unspecified: Secondary | ICD-10-CM

## 2017-06-30 DIAGNOSIS — Z6830 Body mass index (BMI) 30.0-30.9, adult: Secondary | ICD-10-CM

## 2017-06-30 DIAGNOSIS — M79672 Pain in left foot: Secondary | ICD-10-CM

## 2017-06-30 DIAGNOSIS — M79671 Pain in right foot: Secondary | ICD-10-CM | POA: Diagnosis not present

## 2017-06-30 DIAGNOSIS — E559 Vitamin D deficiency, unspecified: Secondary | ICD-10-CM

## 2017-06-30 DIAGNOSIS — Z9189 Other specified personal risk factors, not elsewhere classified: Secondary | ICD-10-CM | POA: Diagnosis not present

## 2017-06-30 MED ORDER — VITAMIN D (ERGOCALCIFEROL) 1.25 MG (50000 UNIT) PO CAPS: 50000.0000 [IU] | ORAL_CAPSULE | ORAL | 0 refills | Status: DC

## 2017-07-01 NOTE — Progress Notes (Signed)
Office: [phone]  /  Fax: [phone]   HPI:   Chief Complaint: OBESITY Courtney [name] is here to discuss her progress with her obesity treatment plan. She is on the Category 1 plan and is following her eating plan approximately 100 % of the time. She states she is doing yard work and dancing for 40 minutes 3 times per week. Courtney [name] did well with weight loss on the category 1 plan. Hunger is controlled, but she didn't always feel satisfied. She felt it was difficult to eat all her protein. Her weight is 154 lb (69.9 kg) today and has had a weight loss of 3 pounds over a period of 2 weeks since her last visit. She has lost 3 lbs since starting treatment with [place].  Vitamin D deficiency Courtney [name] has a diagnosis of vitamin D deficiency. Courtney [name] is not currently taking vit D and she admits fatigue and denies nausea, vomiting or muscle weakness.  Bilateral Foot Pain Courtney [name] has had longstanding foot pain affecting how long she can exercise on the treadmill. She has pain in her arches, both dorsal and ventral aspects.  At risk for cardiovascular disease Courtney [name] is at a higher than average risk for cardiovascular disease due to obesity. She currently denies any chest pain.  ALLERGIES: Allergies  Allergen Reactions  . Erythromycin Hives  . Penicillins Hives  . Shellfish Allergy Hives    MEDICATIONS: Current Outpatient Medications on File Prior to Visit  Medication Sig Dispense Refill  . acetaminophen (TYLENOL) 500 MG tablet Take 1,000 mg by mouth every 6 (six) hours as needed (Pain).     . Alpha-D-Galactosidase (BEANO) TABS Take 1 tablet by mouth daily.    . Alpha-D-Galactosidase (GAS-X PREVENTION PO) Take 1 capsule by mouth daily.    [name] aspirin 81 MG tablet Take 81 mg by mouth daily.    . Digestive Enzymes CAPS Take 1 capsule by mouth daily.    [name] ezetimibe (ZETIA) 10 MG tablet Take 1 tablet (10 mg total) by mouth daily. 90 tablet 3  . metoprolol tartrate (LOPRESSOR) 25 MG tablet TAKE 1/2 TABLET TWICE  DAILY 90 tablet 3  . nitroGLYCERIN (NITROSTAT) 0.4 MG SL tablet Place 1 tablet (0.4 mg total) under the tongue every 5 (five) minutes as needed for chest pain. 25 tablet 3  . omeprazole (PRILOSEC) 40 MG capsule Take 1 capsule (40 mg total) by mouth daily. 90 capsule 3  . ramipril (ALTACE) 5 MG capsule TAKE 1 CAPSULE EVERY DAY 90 capsule 3  . rosuvastatin (CRESTOR) 40 MG tablet TAKE 1 TABLET EVERY DAY 90 tablet 2   No current facility-administered medications on file prior to visit.     PAST MEDICAL HISTORY: Past Medical History:  Diagnosis Date  . Aortic valve sclerosis    echo 40/8144, soft systolic murmur  . 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 Back pain   . Carotid artery disease ([place])    40-59% bilateral, doppler December 2010  . Constipation   . Coronary artery disease    a. BMS-mid RCA 11/2007 b. stress echo 02/2009 subtle inferior HK, lateral ST depressions with stress c. follow-up cath 02/2009: RCA stent patent, severe but stable stenosis of distal posterior lateral branch RCA. LV normal, med tx unless more sx c. ETT Myoview 08/27/12: negative for ischemia; EF 64%  . Decreased hearing   . Depression    Mild, August, 2012  . Dyslipidemia   . Easy bruising   . Ejection fraction    EF normal,  catheterization, 2011 /  EF 55%, echo, 2010  . Fluid overload    Mild, stable since hospitalization in the past  . GERD (gastroesophageal reflux disease)    occarionally, takes Pepcid over the counter  . Heart murmur    has, occasional PVC's  . History of right hip replacement   . Hyperlipidemia   . Hypertension   . Joint pain   . Myocardial infarction ([place])    2009  . Palpitations   . PVC (premature ventricular contraction)    per prior Holter monitor  . Right hip pain 12/2009  . Shellfish allergy   . Trouble in sleeping   . Vitamin B12 deficiency   . Vitamin D deficiency     PAST SURGICAL HISTORY: Past Surgical History:  Procedure Laterality Date  . BREAST BIOPSY Left  08/01/2005  . carcinoid tumor removal    . CARDIAC CATHETERIZATION  1/211   Patent RCA stent, stenotic PLB supplying small distribution; medically managed  . CORONARY ANGIOPLASTY WITH STENT PLACEMENT  11/2007   BMS-mid RCA  . DIAGNOSTIC LAPAROSCOPY     1982 for endometriosis  . DILATION AND CURETTAGE OF UTERUS  2005  . hx of wisdom teeth surgery    . SKIN CANCER EXCISION    . TOTAL HIP ARTHROPLASTY Right 07/08/2014   Procedure: RIGHT TOTAL HIP ARTHROPLASTY ANTERIOR APPROACH;  Surgeon: Mcarthur Rossetti, MD;  Location: [facility];  Service: Orthopedics;  Laterality: Right;    SOCIAL HISTORY: Social History   Tobacco Use  . Smoking status: Never Smoker  . Smokeless tobacco: Never Used  Substance Use Topics  . Alcohol use: Yes    Alcohol/week: 4.2 oz    Types: 7 Glasses of wine per week    Comment: 1 glass of wine/night  . Drug use: No    FAMILY HISTORY: Family History  Problem Relation Age of Onset  . Heart attack Father 33  . Hyperlipidemia Father   . Hypertension Father   . Sudden death Father   . Stroke Mother   . Hyperlipidemia Mother   . Hypertension Mother   . Heart disease Mother   . Obesity Mother   . Heart attack Paternal Grandfather   . Heart disease Sister   . BRCA 1/2 Neg Hx   . Breast cancer Neg Hx     ROS: Review of Systems  Constitutional: Positive for malaise/fatigue and weight loss.  Cardiovascular: Negative for chest pain.  Gastrointestinal: Negative for nausea and vomiting.  Musculoskeletal:       Negative for muscle weakness Foot Pain (bilateral)    PHYSICAL EXAM: Blood pressure 133/73, pulse (!) 49, temperature 98 F (36.7 C), temperature source Oral, height 5' (1.524 m), weight 154 lb (69.9 kg), SpO2 99 %. Body mass index is 30.08 kg/m. Physical Exam  Constitutional: She is oriented to person, place, and time. She appears well-developed and well-nourished.  Cardiovascular: Normal rate.  Pulmonary/Chest: Effort normal.    Musculoskeletal: Normal range of motion.  Neurological: She is oriented to person, place, and time.  Skin: Skin is warm and dry.  Psychiatric: She has a normal mood and affect. Her behavior is normal.  Vitals reviewed.   RECENT LABS AND TESTS: BMET    Component Value Date/Time   NA 141 06/16/2017 0000   K 4.9 06/16/2017 0000   CL 103 06/16/2017 0000   CO2 24 06/16/2017 0000   GLUCOSE 86 06/16/2017 0000   GLUCOSE 96 11/30/2015 1028   BUN 22 06/16/2017 0000  CREATININE 0.86 06/16/2017 0000   CALCIUM 9.6 06/16/2017 0000   GFRNONAA 70 06/16/2017 0000   GFRAA 80 06/16/2017 0000   Lab Results  Component Value Date   HGBA1C 5.6 06/16/2017   Lab Results  Component Value Date   INSULIN 6.4 06/16/2017   CBC    Component Value Date/Time   WBC 8.2 02/25/2017 1055   RBC 4.12 02/25/2017 1055   HGB 13.0 02/25/2017 1055   HCT 38.1 02/25/2017 1055   PLT 226.0 02/25/2017 1055   MCV 92.6 02/25/2017 1055   MCH 30.8 07/10/2014 0440   MCHC 34.1 02/25/2017 1055   RDW 12.9 02/25/2017 1055   LYMPHSABS 2.5 03/10/2009 0922   MONOABS 0.4 03/10/2009 0922   EOSABS 0.2 03/10/2009 0922   BASOSABS 0.0 03/10/2009 0922   Iron/TIBC/Ferritin/ %Sat    Component Value Date/Time   IRON 62 01/09/2015 0806   FERRITIN 26.6 01/09/2015 0806   IRONPCTSAT 15.2 (L) 01/09/2015 0806   Lipid Panel     Component Value Date/Time   CHOL 161 06/16/2017 0000   TRIG 128 06/16/2017 0000   HDL 61 06/16/2017 0000   CHOLHDL 2.5 10/29/2016 1030   CHOLHDL 3 11/30/2015 1028   VLDL 25.4 11/30/2015 1028   LDLCALC 74 06/16/2017 0000   Hepatic Function Panel     Component Value Date/Time   PROT 7.1 06/16/2017 0000   ALBUMIN 4.7 06/16/2017 0000   AST 27 06/16/2017 0000   ALT 20 06/16/2017 0000   ALKPHOS 88 06/16/2017 0000   BILITOT 0.3 06/16/2017 0000   BILIDIR 0.07 10/29/2016 1030      Component Value Date/Time   TSH 0.931 06/16/2017 0000   TSH 1.43 01/09/2015 0806   TSH 0.876 08/26/2012 1722    Results for PORTIA, WISDOM (MRN [id number]) as of 07/01/2017 12:48  Ref. Range 06/16/2017 00:00  Vitamin D, 25-Hydroxy Latest Ref Range: 30.0 - 100.0 ng/mL 37.1   ASSESSMENT AND PLAN: Vitamin D deficiency  Bilateral foot pain - Plan: Ambulatory referral to Podiatry  At risk for heart disease  Class 1 obesity with serious comorbidity and body mass index (BMI) of 30.0 to 30.9 in adult, unspecified obesity type  PLAN:  Vitamin D Deficiency Courtney [name] was informed that low vitamin D levels contributes to fatigue and are associated with obesity, breast, and colon cancer. She agrees to continue to take prescription Vit D '@50'$ ,000 IU every week and will follow up for routine testing of vitamin D, at least 2-3 times per year. She was informed of the risk of over-replacement of vitamin D and agrees to not increase her dose unless she discusses this with [place] first.  Bilateral Foot Pain We will refer to Dr. Earleen Newport (Podiatry) and Courtney [name] agrees to follow up with our clinic in 2 to 3 weeks.  Cardiovascular risk counseling Courtney [name] was given extended (30 minutes) coronary artery disease prevention counseling today. She is 67 y.o. female and has risk factors for heart disease including obesity. We discussed intensive lifestyle modifications today with an emphasis on specific weight loss instructions and strategies. Pt was also informed of the importance of increasing exercise and decreasing saturated fats to help prevent heart disease.  Obesity Courtney [name] is currently in the action stage of change. As such, her goal is to continue with weight loss efforts She has agreed to follow the Category 1 plan Courtney [name] has been instructed to work up to a goal of 150 minutes of combined cardio and strengthening exercise per week for weight loss and overall  health benefits. We discussed the following Behavioral Modification Strategies today: increase H2O intake, increasing lean protein intake, decreasing simple carbohydrates and work  on meal planning and easy cooking plans  Courtney [name] has agreed to follow up with our clinic in 2 to 3 weeks. She was informed of the importance of frequent follow up visits to maximize her success with intensive lifestyle modifications for her multiple health conditions.   OBESITY BEHAVIORAL INTERVENTION VISIT  Today's visit was # 2 out of 22.  Starting weight: 157 lbs Starting date: 06/16/17 Today's weight : 154 lbs Today's date: 06/30/2017 Total lbs lost to date: 3 (Patients must lose 7 lbs in the first 6 months to continue with counseling)   ASK: We discussed the diagnosis of obesity with Courtney [name] today and Courtney [name] agreed to give [place] permission to discuss obesity behavioral modification therapy today.  ASSESS: Courtney [name] has the diagnosis of obesity and her BMI today is 30.08 Courtney [name] is in the action stage of change   ADVISE: Courtney [name] was educated on the multiple health risks of obesity as well as the benefit of weight loss to improve her health. She was advised of the need for long term treatment and the importance of lifestyle modifications.  AGREE: Multiple dietary modification options and treatment options were discussed and  Courtney [name] agreed to the above obesity treatment plan.  I, Doreene Nest, am acting as transcriptionist for Dennard Nip, MD  I have reviewed the above documentation for accuracy and completeness, and I agree with the above. -Dennard Nip, MD

## 2017-07-03 ENCOUNTER — Encounter (INDEPENDENT_AMBULATORY_CARE_PROVIDER_SITE_OTHER): Payer: Self-pay | Admitting: Family Medicine

## 2017-07-04 ENCOUNTER — Encounter: Payer: Self-pay | Admitting: Cardiology

## 2017-07-10 ENCOUNTER — Ambulatory Visit: Payer: Medicare HMO | Admitting: Podiatry

## 2017-07-15 ENCOUNTER — Ambulatory Visit (INDEPENDENT_AMBULATORY_CARE_PROVIDER_SITE_OTHER): Payer: Medicare HMO

## 2017-07-15 ENCOUNTER — Ambulatory Visit: Payer: Medicare HMO | Admitting: Podiatry

## 2017-07-15 ENCOUNTER — Encounter: Payer: Self-pay | Admitting: Podiatry

## 2017-07-15 VITALS — BP 120/58 | HR 48

## 2017-07-15 DIAGNOSIS — N393 Stress incontinence (female) (male): Secondary | ICD-10-CM | POA: Insufficient documentation

## 2017-07-15 DIAGNOSIS — M7752 Other enthesopathy of left foot: Secondary | ICD-10-CM

## 2017-07-15 DIAGNOSIS — G4700 Insomnia, unspecified: Secondary | ICD-10-CM | POA: Insufficient documentation

## 2017-07-15 DIAGNOSIS — M79672 Pain in left foot: Secondary | ICD-10-CM

## 2017-07-15 DIAGNOSIS — M779 Enthesopathy, unspecified: Secondary | ICD-10-CM | POA: Diagnosis not present

## 2017-07-15 DIAGNOSIS — M7751 Other enthesopathy of right foot: Secondary | ICD-10-CM | POA: Diagnosis not present

## 2017-07-15 DIAGNOSIS — F419 Anxiety disorder, unspecified: Secondary | ICD-10-CM | POA: Insufficient documentation

## 2017-07-15 DIAGNOSIS — M79671 Pain in right foot: Secondary | ICD-10-CM

## 2017-07-15 DIAGNOSIS — E669 Obesity, unspecified: Secondary | ICD-10-CM | POA: Insufficient documentation

## 2017-07-15 MED ORDER — DICLOFENAC SODIUM 1 % TD GEL: 2.0000 g | Freq: Four times a day (QID) | TRANSDERMAL | 2 refills | Status: DC

## 2017-07-16 NOTE — Progress Notes (Signed)
Subjective:   Patient ID: Courtney [name], female   DOB: 67 y.o.   MRN: [id number]   HPI 67 year old female presents the office today for concerns of pain in the left foot which is been ongoing for 1 month.  She points across the top of the foot where she gets the majority of symptoms.  She also states that she will get some pain, cramping and some occasional numbness in the middle 3 toes of the left foot but this is been ongoing for much longer.  She denies any recent injury or trauma she denies any swelling or redness.  She has no sharp shooting pains.  She has tried changing shoes as well as purchase an over-the-counter insert without any significant resolution.  No significant swelling or redness.  She has no other concerns today.   Review of Systems  All other systems reviewed and are negative.  Past Medical History:  Diagnosis Date  . Aortic valve sclerosis    echo 58/0998, soft systolic murmur  . 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 Back pain   . Carotid artery disease ([place])    40-59% bilateral, doppler December 2010  . Constipation   . Coronary artery disease    a. BMS-mid RCA 11/2007 b. stress echo 02/2009 subtle inferior HK, lateral ST depressions with stress c. follow-up cath 02/2009: RCA stent patent, severe but stable stenosis of distal posterior lateral branch RCA. LV normal, med tx unless more sx c. ETT Myoview 08/27/12: negative for ischemia; EF 64%  . Decreased hearing   . Depression    Mild, August, 2012  . Dyslipidemia   . Easy bruising   . Ejection fraction    EF normal, catheterization, 2011 /  EF 55%, echo, 2010  . Fluid overload    Mild, stable since hospitalization in the past  . GERD (gastroesophageal reflux disease)    occarionally, takes Pepcid over the counter  . Heart murmur    has, occasional PVC's  . History of right hip replacement   . Hyperlipidemia   . Hypertension   . Joint pain   . Myocardial infarction ([place])    2009  . Palpitations   . PVC (premature  ventricular contraction)    per prior Holter monitor  . Right hip pain 12/2009  . Shellfish allergy   . Trouble in sleeping   . Vitamin B12 deficiency   . Vitamin D deficiency     Past Surgical History:  Procedure Laterality Date  . 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/01/2005  . carcinoid tumor removal    . CARDIAC CATHETERIZATION  1/211   Patent RCA stent, stenotic PLB supplying small distribution; medically managed  . CORONARY ANGIOPLASTY WITH STENT PLACEMENT  11/2007   BMS-mid RCA  . DIAGNOSTIC LAPAROSCOPY     1982 for endometriosis  . DILATION AND CURETTAGE OF UTERUS  2005  . hx of wisdom teeth surgery    . SKIN CANCER EXCISION    . TOTAL HIP ARTHROPLASTY Right 07/08/2014   Procedure: RIGHT TOTAL HIP ARTHROPLASTY ANTERIOR APPROACH;  Surgeon: Mcarthur Rossetti, MD;  Location: [facility];  Service: Orthopedics;  Laterality: Right;     Current Outpatient Medications:  .  acetaminophen (TYLENOL) 500 MG tablet, Take 1,000 mg by mouth every 6 (six) hours as needed (Pain). , Disp: , Rfl:  .  Alpha-D-Galactosidase (BEANO) TABS, Take 1 tablet by mouth daily., Disp: , Rfl:  .  Alpha-D-Galactosidase (GAS-X PREVENTION PO), Take 1 capsule by mouth daily.,  Disp: , Rfl:  .  ALPRAZolam (XANAX) 0.5 MG tablet, alprazolam 0.5 mg tablet  TAKE 1 TABLET BY MOUTH AT BEDTIME, Disp: , Rfl:  .  aspirin 81 MG tablet, Take 81 mg by mouth daily., Disp: , Rfl:  .  clobetasol cream (TEMOVATE) 0.05 %, clobetasol 0.05 % topical cream, Disp: , Rfl:  .  clotrimazole-betamethasone (LOTRISONE) cream, , Disp: , Rfl:  .  diclofenac sodium (VOLTAREN) 1 % GEL, Apply 2 g topically 4 (four) times daily. Rub into affected area of foot 2 to 4 times daily, Disp: 100 g, Rfl: 2 .  Digestive Enzymes CAPS, Take 1 capsule by mouth daily., Disp: , Rfl:  .  ezetimibe (ZETIA) 10 MG tablet, Take 1 tablet (10 mg total) by mouth daily., Disp: 90 tablet, Rfl: 3 .  metoprolol tartrate (LOPRESSOR) 25 MG tablet, TAKE 1/2 TABLET TWICE DAILY, Disp:  90 tablet, Rfl: 3 .  nitroGLYCERIN (NITROSTAT) 0.4 MG SL tablet, Place 1 tablet (0.4 mg total) under the tongue every 5 (five) minutes as needed for chest pain., Disp: 25 tablet, Rfl: 3 .  omeprazole (PRILOSEC) 40 MG capsule, Take 1 capsule (40 mg total) by mouth daily., Disp: 90 capsule, Rfl: 3 .  ramipril (ALTACE) 5 MG capsule, TAKE 1 CAPSULE EVERY DAY, Disp: 90 capsule, Rfl: 3 .  rosuvastatin (CRESTOR) 40 MG tablet, TAKE 1 TABLET EVERY DAY, Disp: 90 tablet, Rfl: 2 .  Vitamin D, Ergocalciferol, (DRISDOL) 50000 units CAPS capsule, Take 1 capsule (50,000 Units total) by mouth every 7 (seven) days., Disp: 4 capsule, Rfl: 0 .  Zoster Vaccine Adjuvanted (SHINGRIX) injection, Shingrix (PF) 50 mcg/0.5 mL intramuscular suspension, kit  INJECT 0 5 MLS INTO THE MUSCLE ONCE  REPEAT IN 2 TO 6 MONTHS AFTER FIRST INJECTION, Disp: , Rfl:   Allergies  Allergen Reactions  . Erythromycin Hives  . Penicillins Hives  . Shellfish Allergy Hives    Social History   Socioeconomic History  . Marital status: Divorced    Spouse name: Not on file  . Number of children: 1  . Years of education: 54  . Highest education level: Not on file  Occupational History  . Occupation: Retired - Retail buyer  Social Needs  . Financial resource strain: Not on file  . Food insecurity:    Worry: Not on file    Inability: Not on file  . Transportation needs:    Medical: Not on file    Non-medical: Not on file  Tobacco Use  . Smoking status: Never Smoker  . Smokeless tobacco: Never Used  Substance and Sexual Activity  . Alcohol use: Yes    Alcohol/week: 4.2 oz    Types: 7 Glasses of wine per week    Comment: 1 glass of wine/night  . Drug use: No  . Sexual activity: Yes  Lifestyle  . Physical activity:    Days per week: Not on file    Minutes per session: Not on file  . Stress: Not on file  Relationships  . Social connections:    Talks on phone: Not on file    Gets together: Not on file    Attends  religious service: Not on file    Active member of club or organization: Not on file    Attends meetings of clubs or organizations: Not on file    Relationship status: Not on file  . Intimate partner violence:    Fear of current or ex partner: Not on file    Emotionally abused:  Not on file    Physically abused: Not on file    Forced sexual activity: Not on file  Other Topics Concern  . Not on file  Social History Narrative   Fun: Dance, garden,    Denies religious beliefs effecting health care.   Denies abuse and feels safe at home       Objective:  Physical Exam  General: AAO x3, NAD  Dermatological: Skin is warm, dry and supple bilateral. Nails x 10 are well manicured; remaining integument appears unremarkable at this time. There are no open sores, no preulcerative lesions, no rash or signs of infection present.  Vascular: Dorsalis Pedis artery and Posterior Tibial artery pedal pulses are 2/4 bilateral with immedate capillary fill time. There is no pain with calf compression, swelling, warmth, erythema.   Neruologic: Grossly intact via light touch bilateral.. Protective threshold with Semmes Wienstein monofilament intact to all pedal sites bilateral.  Musculoskeletal: Mild decrease in medial arch height with the left side worse than the right.  There is no area pinpoint bony tenderness or pain to vibratory sensation.  There is tenderness along the midfoot along the Lisfranc joint diffusely across the entire top of her foot.  Small bursa is palpable along submetatarsal 2 on the left foot but there is no significant edema, erythema there is no increase in warmth.  Ankle, subtalar joint range of motion intact.  Muscular strength 5/5 in all groups tested bilateral.  Gait: Unassisted, Nonantalgic.       Assessment:   Left foot pain, capsulitis/bursitis     Plan:  -Treatment options discussed including all alternatives, risks, and complications -Etiology of symptoms were  discussed -X-rays were obtained and reviewed with the patient.  No definitive evidence of acute fracture or stress fracture identified today.  Mildly elongated second metatarsal. -We discussed various treatment options.  At this point she is attempted shoe changes as well as an over-the-counter insert without complete resolution I think a more custom insert will be beneficial.  We will also add a metatarsal pad to help with this. -Voltaren gel -Ice to the area daily -Stretching before and after exercise -Follow-up in 3 weeks to pick up orthotics or sooner if needed.  Trula Slade DPM

## 2017-07-21 ENCOUNTER — Ambulatory Visit (INDEPENDENT_AMBULATORY_CARE_PROVIDER_SITE_OTHER): Payer: Medicare HMO | Admitting: Family Medicine

## 2017-07-22 NOTE — Progress Notes (Signed)
Cardiology Office Note    Date:  07/25/2017   ID:  Courtney [name], DOB Jul 07, 1950, MRN [id number]  PCP:  Lance Sell, NP  Cardiologist:  Blythe Veach Martinique, MD    History of Present Illness:  Courtney [name] is a 67 y.o. female seen for follow up CAD.  She has a history of CAD. Presented in 2009 and had a BMS of the mid RCA. She states she had 2 stents. In 2011 she had a mildly abnormal stress test and repeat cardiac cath showed nonobstructive CAD. In 2014 she had a normal Myoview study. She also has a history of mild carotid disease, HTN, PVCs, and obesity. She is retired as a Retail buyer for [employer].   On follow up today she reports she is doing very well. No cardiac complaints. Specifically denies chest pain or SOB. No edema or palpitations. She is no enrolled in Dr. Migdalia Dk weight loss program. She is exercising regularly.    Past Medical History:  Diagnosis Date  . Aortic valve sclerosis    echo 84/6962, soft systolic murmur  . 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 Back pain   . Carotid artery disease ([place])    40-59% bilateral, doppler December 2010  . Constipation   . Coronary artery disease    a. BMS-mid RCA 11/2007 b. stress echo 02/2009 subtle inferior HK, lateral ST depressions with stress c. follow-up cath 02/2009: RCA stent patent, severe but stable stenosis of distal posterior lateral branch RCA. LV normal, med tx unless more sx c. ETT Myoview 08/27/12: negative for ischemia; EF 64%  . Decreased hearing   . Depression    Mild, August, 2012  . Dyslipidemia   . Easy bruising   . Ejection fraction    EF normal, catheterization, 2011 /  EF 55%, echo, 2010  . Fluid overload    Mild, stable since hospitalization in the past  . GERD (gastroesophageal reflux disease)    occarionally, takes Pepcid over the counter  . Heart murmur    has, occasional PVC's  . History of right hip replacement   . Hyperlipidemia   . Hypertension   . Joint pain   . Myocardial  infarction ([place])    2009  . Palpitations   . PVC (premature ventricular contraction)    per prior Holter monitor  . Right hip pain 12/2009  . Shellfish allergy   . Trouble in sleeping   . Vitamin B12 deficiency   . Vitamin D deficiency     Past Surgical History:  Procedure Laterality Date  . 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/01/2005  . carcinoid tumor removal    . CARDIAC CATHETERIZATION  1/211   Patent RCA stent, stenotic PLB supplying small distribution; medically managed  . CORONARY ANGIOPLASTY WITH STENT PLACEMENT  11/2007   BMS-mid RCA  . DIAGNOSTIC LAPAROSCOPY     1982 for endometriosis  . DILATION AND CURETTAGE OF UTERUS  2005  . hx of wisdom teeth surgery    . SKIN CANCER EXCISION    . TOTAL HIP ARTHROPLASTY Right 07/08/2014   Procedure: RIGHT TOTAL HIP ARTHROPLASTY ANTERIOR APPROACH;  Surgeon: Mcarthur Rossetti, MD;  Location: [facility];  Service: Orthopedics;  Laterality: Right;    Current Medications: Outpatient Medications Prior to Visit  Medication Sig Dispense Refill  . acetaminophen (TYLENOL) 500 MG tablet Take 1,000 mg by mouth every 6 (six) hours as needed (Pain).     . Alpha-D-Galactosidase (BEANO) TABS Take 1 tablet  by mouth daily.    [name] aspirin 81 MG tablet Take 81 mg by mouth daily.    . clobetasol cream (TEMOVATE) 0.05 % clobetasol 0.05 % topical cream    . clotrimazole-betamethasone (LOTRISONE) cream     . diclofenac sodium (VOLTAREN) 1 % GEL Apply 2 g topically 4 (four) times daily. Rub into affected area of foot 2 to 4 times daily 100 g 2  . Digestive Enzymes CAPS Take 1 capsule by mouth daily.    . metoprolol tartrate (LOPRESSOR) 25 MG tablet TAKE 1/2 TABLET TWICE DAILY 90 tablet 3  . nitroGLYCERIN (NITROSTAT) 0.4 MG SL tablet Place 1 tablet (0.4 mg total) under the tongue every 5 (five) minutes as needed for chest pain. 25 tablet 3  . omeprazole (PRILOSEC) 40 MG capsule Take 1 capsule (40 mg total) by mouth daily. 90 capsule 3  . ramipril (ALTACE) 5 MG capsule  TAKE 1 CAPSULE EVERY DAY 90 capsule 3  . rosuvastatin (CRESTOR) 40 MG tablet TAKE 1 TABLET EVERY DAY 90 tablet 2  . Vitamin D, Ergocalciferol, (DRISDOL) 50000 units CAPS capsule Take 1 capsule (50,000 Units total) by mouth every 7 (seven) days. 4 capsule 0  . ezetimibe (ZETIA) 10 MG tablet Take 1 tablet (10 mg total) by mouth daily. 90 tablet 3  . ALPRAZolam (XANAX) 0.5 MG tablet alprazolam 0.5 mg tablet  TAKE 1 TABLET BY MOUTH AT BEDTIME     No facility-administered medications prior to visit.      Allergies:   Erythromycin; Penicillins; and Shellfish allergy   Social History   Socioeconomic History  . Marital status: Divorced    Spouse name: Not on file  . Number of children: 1  . Years of education: 34  . Highest education level: Not on file  Occupational History  . Occupation: Retired - Retail buyer  Social Needs  . Financial resource strain: Not on file  . Food insecurity:    Worry: Not on file    Inability: Not on file  . Transportation needs:    Medical: Not on file    Non-medical: Not on file  Tobacco Use  . Smoking status: Never Smoker  . Smokeless tobacco: Never Used  Substance and Sexual Activity  . Alcohol use: Yes    Alcohol/week: 4.2 oz    Types: 7 Glasses of wine per week    Comment: 1 glass of wine/night  . Drug use: No  . Sexual activity: Yes  Lifestyle  . Physical activity:    Days per week: Not on file    Minutes per session: Not on file  . Stress: Not on file  Relationships  . Social connections:    Talks on phone: Not on file    Gets together: Not on file    Attends religious service: Not on file    Active member of club or organization: Not on file    Attends meetings of clubs or organizations: Not on file    Relationship status: Not on file  Other Topics Concern  . Not on file  Social History Narrative   Fun: Dance, garden,    Denies religious beliefs effecting health care.   Denies abuse and feels safe at home     Family  History:  The patient's family history includes Heart attack in her paternal grandfather; Heart attack (age of onset: 47) in her father; Heart disease in her mother and sister; Hyperlipidemia in her father and mother; Hypertension in her father and mother; Obesity  in her mother; Stroke in her mother; Sudden death in her father.   ROS:   Please see the history of present illness.    ROS All other systems reviewed and are negative.   PHYSICAL EXAM:   VS:  BP 124/72   Pulse (!) 52   Ht 5' (1.524 m)   Wt 154 lb 12.8 oz (70.2 kg)   BMI 30.23 kg/m    GENERAL:  Well appearing, overweight WF in NAD HEENT:  PERRL, EOMI, sclera are clear. Oropharynx is clear. NECK:  No jugular venous distention, carotid upstroke brisk and symmetric, no bruits, no thyromegaly or adenopathy LUNGS:  Clear to auscultation bilaterally CHEST:  Unremarkable HEART:  RRR,  PMI not displaced or sustained,S1 and S2 within normal limits, no S3, no S4: no clicks, no rubs, gr 1/6 systolic murmur RUSB ABD:  Soft, nontender. BS +, no masses or bruits. No hepatomegaly, no splenomegaly EXT:  2 + pulses throughout, no edema, no cyanosis no clubbing SKIN:  Warm and dry.  No rashes NEURO:  Alert and oriented x 3. Cranial nerves II through XII intact. PSYCH:  Cognitively intact    Wt Readings from Last 3 Encounters:  07/25/17 154 lb 12.8 oz (70.2 kg)  07/23/17 151 lb (68.5 kg)  06/30/17 154 lb (69.9 kg)      Studies/Labs Reviewed:   EKG:  EKG is not ordered today.     Recent Labs: 02/25/2017: Hemoglobin 13.0; Platelets 226.0 06/16/2017: ALT 20; BUN 22; Creatinine, Ser 0.86; Potassium 4.9; Sodium 141; TSH 0.931   Lipid Panel    Component Value Date/Time   CHOL 161 06/16/2017 0000   TRIG 128 06/16/2017 0000   HDL 61 06/16/2017 0000   CHOLHDL 2.5 10/29/2016 1030   CHOLHDL 3 11/30/2015 1028   VLDL 25.4 11/30/2015 1028   LDLCALC 74 06/16/2017 0000    Additional studies/ records that were reviewed today include:    Myoview 08/02/16: Study Highlights     The left ventricular ejection fraction is hyperdynamic (>65%).  Nuclear stress EF: 66%.  Blood pressure demonstrated a hypertensive response to exercise.  Horizontal ST segment depression ST segment depression of 1 mm was noted during stress in the II, III and aVF leads.  This is a low risk study.   ECG positive with 1 mm ST depression inferior leads peak stress and recovery However perfusion images normal with no ischemia or infarct EF 66% HTN response to exercise        ASSESSMENT:    1. Coronary artery disease involving native coronary artery of native heart without angina pectoris   2. Pure hypercholesterolemia      PLAN:  In order of problems listed above:  1. She remains asymptomatic. Myoview in June 2018 was normal. Continue current medical therapy with ASA, statin, metoprolol and ramipril. Encourage continued efforts at weight loss and exercise.  2. LDL improved from 98 to 74 with addition of Zetia. Continue  high dose statin and Zetia.   I will follow up in one year.    Medication Adjustments/Labs and Tests Ordered: Current medicines are reviewed at length with the patient today.  Concerns regarding medicines are outlined above.  Medication changes, Labs and Tests ordered today are listed in the Patient Instructions below. Patient Instructions  Continue your current therapy  I will see you in one year     Signed, Kenai Fluegel Martinique, MD  07/25/2017 8:34 AM    [facility] [address] [place], [place],  [address] [phone]   

## 2017-07-23 ENCOUNTER — Ambulatory Visit (INDEPENDENT_AMBULATORY_CARE_PROVIDER_SITE_OTHER): Payer: Medicare HMO | Admitting: Family Medicine

## 2017-07-23 VITALS — BP 114/61 | HR 50 | Temp 98.0°F | Ht 60.0 in | Wt 151.0 lb

## 2017-07-23 DIAGNOSIS — E669 Obesity, unspecified: Secondary | ICD-10-CM | POA: Diagnosis not present

## 2017-07-23 DIAGNOSIS — E559 Vitamin D deficiency, unspecified: Secondary | ICD-10-CM

## 2017-07-23 DIAGNOSIS — Z6830 Body mass index (BMI) 30.0-30.9, adult: Secondary | ICD-10-CM

## 2017-07-23 MED ORDER — VITAMIN D (ERGOCALCIFEROL) 1.25 MG (50000 UNIT) PO CAPS: 50000.0000 [IU] | ORAL_CAPSULE | ORAL | 0 refills | Status: DC

## 2017-07-23 NOTE — Progress Notes (Signed)
Office: [phone]  /  Fax: [phone]   HPI:   Chief Complaint: OBESITY Courtney [name] is here to discuss her progress with her obesity treatment plan. She is on the Category 1 plan and is following her eating plan approximately 92 % of the time. She states she is dancing, treadmill, and gardening for 45 minutes 1-2 times per week. Courtney [name] continues to do well with weight loss but is unhappy with the rate of weight loss even though she met expectations. She had some eating out and increase celebration eating.  Her weight is 151 lb (68.5 kg) today and has had a weight loss of 3 pounds over a period of 3 weeks since her last visit. She has lost 6 lbs since starting treatment with [place].  Vitamin D Deficiency Courtney [name] has a diagnosis of vitamin D deficiency. She is stable on prescription Vit D and denies nausea, vomiting or muscle weakness.  ALLERGIES: Allergies  Allergen Reactions  . Erythromycin Hives  . Penicillins Hives  . Shellfish Allergy Hives    MEDICATIONS: Current Outpatient Medications on File Prior to Visit  Medication Sig Dispense Refill  . acetaminophen (TYLENOL) 500 MG tablet Take 1,000 mg by mouth every 6 (six) hours as needed (Pain).     . Alpha-D-Galactosidase (BEANO) TABS Take 1 tablet by mouth daily.    [name] ALPRAZolam (XANAX) 0.5 MG tablet alprazolam 0.5 mg tablet  TAKE 1 TABLET BY MOUTH AT BEDTIME    . aspirin 81 MG tablet Take 81 mg by mouth daily.    . clobetasol cream (TEMOVATE) 0.05 % clobetasol 0.05 % topical cream    . clotrimazole-betamethasone (LOTRISONE) cream     . diclofenac sodium (VOLTAREN) 1 % GEL Apply 2 g topically 4 (four) times daily. Rub into affected area of foot 2 to 4 times daily 100 g 2  . Digestive Enzymes CAPS Take 1 capsule by mouth daily.    . metoprolol tartrate (LOPRESSOR) 25 MG tablet TAKE 1/2 TABLET TWICE DAILY 90 tablet 3  . nitroGLYCERIN (NITROSTAT) 0.4 MG SL tablet Place 1 tablet (0.4 mg total) under the tongue every 5 (five) minutes as  needed for chest pain. 25 tablet 3  . omeprazole (PRILOSEC) 40 MG capsule Take 1 capsule (40 mg total) by mouth daily. 90 capsule 3  . ramipril (ALTACE) 5 MG capsule TAKE 1 CAPSULE EVERY DAY 90 capsule 3  . rosuvastatin (CRESTOR) 40 MG tablet TAKE 1 TABLET EVERY DAY 90 tablet 2  . ezetimibe (ZETIA) 10 MG tablet Take 1 tablet (10 mg total) by mouth daily. 90 tablet 3   No current facility-administered medications on file prior to visit.     PAST MEDICAL HISTORY: Past Medical History:  Diagnosis Date  . Aortic valve sclerosis    echo 70/3500, soft systolic murmur  . 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 Back pain   . Carotid artery disease ([place])    40-59% bilateral, doppler December 2010  . Constipation   . Coronary artery disease    a. BMS-mid RCA 11/2007 b. stress echo 02/2009 subtle inferior HK, lateral ST depressions with stress c. follow-up cath 02/2009: RCA stent patent, severe but stable stenosis of distal posterior lateral branch RCA. LV normal, med tx unless more sx c. ETT Myoview 08/27/12: negative for ischemia; EF 64%  . Decreased hearing   . Depression    Mild, August, 2012  . Dyslipidemia   . Easy bruising   . Ejection fraction    EF normal,  catheterization, 2011 /  EF 55%, echo, 2010  . Fluid overload    Mild, stable since hospitalization in the past  . GERD (gastroesophageal reflux disease)    occarionally, takes Pepcid over the counter  . Heart murmur    has, occasional PVC's  . History of right hip replacement   . Hyperlipidemia   . Hypertension   . Joint pain   . Myocardial infarction ([place])    2009  . Palpitations   . PVC (premature ventricular contraction)    per prior Holter monitor  . Right hip pain 12/2009  . Shellfish allergy   . Trouble in sleeping   . Vitamin B12 deficiency   . Vitamin D deficiency     PAST SURGICAL HISTORY: Past Surgical History:  Procedure Laterality Date  . 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/01/2005  . carcinoid tumor removal    . CARDIAC  CATHETERIZATION  1/211   Patent RCA stent, stenotic PLB supplying small distribution; medically managed  . CORONARY ANGIOPLASTY WITH STENT PLACEMENT  11/2007   BMS-mid RCA  . DIAGNOSTIC LAPAROSCOPY     1982 for endometriosis  . DILATION AND CURETTAGE OF UTERUS  2005  . hx of wisdom teeth surgery    . SKIN CANCER EXCISION    . TOTAL HIP ARTHROPLASTY Right 07/08/2014   Procedure: RIGHT TOTAL HIP ARTHROPLASTY ANTERIOR APPROACH;  Surgeon: Mcarthur Rossetti, MD;  Location: [facility];  Service: Orthopedics;  Laterality: Right;    SOCIAL HISTORY: Social History   Tobacco Use  . Smoking status: Never Smoker  . Smokeless tobacco: Never Used  Substance Use Topics  . Alcohol use: Yes    Alcohol/week: 4.2 oz    Types: 7 Glasses of wine per week    Comment: 1 glass of wine/night  . Drug use: No    FAMILY HISTORY: Family History  Problem Relation Age of Onset  . Heart attack Father 86  . Hyperlipidemia Father   . Hypertension Father   . Sudden death Father   . Stroke Mother   . Hyperlipidemia Mother   . Hypertension Mother   . Heart disease Mother   . Obesity Mother   . Heart attack Paternal Grandfather   . Heart disease Sister   . BRCA 1/2 Neg Hx   . Breast cancer Neg Hx     ROS: Review of Systems  Constitutional: Positive for weight loss.  Gastrointestinal: Negative for nausea and vomiting.  Musculoskeletal:       Negative muscle weakness    PHYSICAL EXAM: Blood pressure 114/61, pulse (!) 50, temperature 98 F (36.7 C), temperature source Oral, height 5' (1.524 m), weight 151 lb (68.5 kg), SpO2 97 %. Body mass index is 29.49 kg/m. Physical Exam  Constitutional: She is oriented to person, place, and time. She appears well-developed and well-nourished.  Cardiovascular: Normal rate.  Pulmonary/Chest: Effort normal.  Musculoskeletal: Normal range of motion.  Neurological: She is oriented to person, place, and time.  Skin: Skin is warm and dry.  Psychiatric: She has a  normal mood and affect. Her behavior is normal.  Vitals reviewed.   RECENT LABS AND TESTS: BMET    Component Value Date/Time   NA 141 06/16/2017 0000   K 4.9 06/16/2017 0000   CL 103 06/16/2017 0000   CO2 24 06/16/2017 0000   GLUCOSE 86 06/16/2017 0000   GLUCOSE 96 11/30/2015 1028   BUN 22 06/16/2017 0000   CREATININE 0.86 06/16/2017 0000   CALCIUM 9.6 06/16/2017 0000  GFRNONAA 70 06/16/2017 0000   GFRAA 80 06/16/2017 0000   Lab Results  Component Value Date   HGBA1C 5.6 06/16/2017   Lab Results  Component Value Date   INSULIN 6.4 06/16/2017   CBC    Component Value Date/Time   WBC 8.2 02/25/2017 1055   RBC 4.12 02/25/2017 1055   HGB 13.0 02/25/2017 1055   HCT 38.1 02/25/2017 1055   PLT 226.0 02/25/2017 1055   MCV 92.6 02/25/2017 1055   MCH 30.8 07/10/2014 0440   MCHC 34.1 02/25/2017 1055   RDW 12.9 02/25/2017 1055   LYMPHSABS 2.5 03/10/2009 0922   MONOABS 0.4 03/10/2009 0922   EOSABS 0.2 03/10/2009 0922   BASOSABS 0.0 03/10/2009 0922   Iron/TIBC/Ferritin/ %Sat    Component Value Date/Time   IRON 62 01/09/2015 0806   FERRITIN 26.6 01/09/2015 0806   IRONPCTSAT 15.2 (L) 01/09/2015 0806   Lipid Panel     Component Value Date/Time   CHOL 161 06/16/2017 0000   TRIG 128 06/16/2017 0000   HDL 61 06/16/2017 0000   CHOLHDL 2.5 10/29/2016 1030   CHOLHDL 3 11/30/2015 1028   VLDL 25.4 11/30/2015 1028   LDLCALC 74 06/16/2017 0000   Hepatic Function Panel     Component Value Date/Time   PROT 7.1 06/16/2017 0000   ALBUMIN 4.7 06/16/2017 0000   AST 27 06/16/2017 0000   ALT 20 06/16/2017 0000   ALKPHOS 88 06/16/2017 0000   BILITOT 0.3 06/16/2017 0000   BILIDIR 0.07 10/29/2016 1030      Component Value Date/Time   TSH 0.931 06/16/2017 0000   TSH 1.43 01/09/2015 0806   TSH 0.876 08/26/2012 1722  Results for LILU, MCGLOWN (MRN [id number]) as of 07/23/2017 11:20  Ref. Range 06/16/2017 00:00  Vitamin D, 25-Hydroxy Latest Ref Range: 30.0 - 100.0 ng/mL 37.1     ASSESSMENT AND PLAN: Vitamin D deficiency  Class 1 obesity with serious comorbidity and body mass index (BMI) of 30.0 to 30.9 in adult, unspecified obesity type - Starting BMI greater then 30  PLAN:  Vitamin D Deficiency Sapna was informed that low vitamin D levels contributes to fatigue and are associated with obesity, breast, and colon cancer. Venida agrees to continue taking prescription Vit D _0 ,000 IU every week #4 and we will refill for 1 month. She will follow up for routine testing of vitamin D, at least 2-3 times per year. She was informed of the risk of over-replacement of vitamin D and agrees to not increase her dose unless she discusses this with [place] first. Hadlie agrees to follow up with our clinic in 2 to 3 weeks.  Obesity Maelyn is currently in the action stage of change. As such, her goal is to continue with weight loss efforts She has agreed to follow the Category 1 plan Kimber has been instructed to work up to a goal of 150 minutes of combined cardio and strengthening exercise per week for weight loss and overall health benefits. We discussed the following Behavioral Modification Strategies today: increasing lean protein intake and decrease eating out (and discussed eating out strategies)   Paquita has agreed to follow up with our clinic in 2 to 3 weeks. She was informed of the importance of frequent follow up visits to maximize her success with intensive lifestyle modifications for her multiple health conditions.   OBESITY BEHAVIORAL INTERVENTION VISIT  Today's visit was # 3 out of 22.  Starting weight: 157 lbs Starting date: 06/16/17 Today's weight : 151 lbs Today's  date: 07/23/2017 Total lbs lost to date: 6 (Patients must lose 7 lbs in the first 6 months to continue with counseling)   ASK: We discussed the diagnosis of obesity with [name] today and Neddie agreed to give [place] permission to discuss obesity behavioral modification therapy today.  ASSESS: Vinisha  has the diagnosis of obesity and her BMI today is 29.49 Penina is in the action stage of change   ADVISE: Elenie was educated on the multiple health risks of obesity as well as the benefit of weight loss to improve her health. She was advised of the need for long term treatment and the importance of lifestyle modifications.  AGREE: Multiple dietary modification options and treatment options were discussed and  Deaven agreed to the above obesity treatment plan.  I, Trixie Dredge, am acting as transcriptionist for Dennard Nip, MD  I have reviewed the above documentation for accuracy and completeness, and I agree with the above. -Dennard Nip, MD

## 2017-07-25 ENCOUNTER — Encounter: Payer: Self-pay | Admitting: Cardiology

## 2017-07-25 ENCOUNTER — Ambulatory Visit (INDEPENDENT_AMBULATORY_CARE_PROVIDER_SITE_OTHER): Payer: Medicare HMO | Admitting: Cardiology

## 2017-07-25 VITALS — BP 124/72 | HR 52 | Ht 60.0 in | Wt 154.8 lb

## 2017-07-25 DIAGNOSIS — I2510 Atherosclerotic heart disease of native coronary artery without angina pectoris: Secondary | ICD-10-CM | POA: Diagnosis not present

## 2017-07-25 DIAGNOSIS — E7800 Pure hypercholesterolemia, unspecified: Secondary | ICD-10-CM | POA: Diagnosis not present

## 2017-07-25 NOTE — Patient Instructions (Signed)
Continue your current therapy  I will see you in one year   

## 2017-07-27 ENCOUNTER — Other Ambulatory Visit: Payer: Self-pay | Admitting: Cardiology

## 2017-08-01 DIAGNOSIS — Z01419 Encounter for gynecological examination (general) (routine) without abnormal findings: Secondary | ICD-10-CM | POA: Diagnosis not present

## 2017-08-05 ENCOUNTER — Other Ambulatory Visit: Payer: Medicare HMO | Admitting: Orthotics

## 2017-08-06 ENCOUNTER — Other Ambulatory Visit: Payer: Medicare HMO | Admitting: Orthotics

## 2017-08-11 ENCOUNTER — Ambulatory Visit (INDEPENDENT_AMBULATORY_CARE_PROVIDER_SITE_OTHER): Payer: Medicare HMO | Admitting: Family Medicine

## 2017-08-11 VITALS — BP 115/53 | HR 49 | Temp 97.9°F | Ht 60.0 in | Wt 149.0 lb

## 2017-08-11 DIAGNOSIS — E669 Obesity, unspecified: Secondary | ICD-10-CM | POA: Diagnosis not present

## 2017-08-11 DIAGNOSIS — E559 Vitamin D deficiency, unspecified: Secondary | ICD-10-CM | POA: Diagnosis not present

## 2017-08-11 DIAGNOSIS — Z6830 Body mass index (BMI) 30.0-30.9, adult: Secondary | ICD-10-CM

## 2017-08-11 MED ORDER — VITAMIN D (ERGOCALCIFEROL) 1.25 MG (50000 UNIT) PO CAPS: 50000.0000 [IU] | ORAL_CAPSULE | ORAL | 0 refills | Status: DC

## 2017-08-11 NOTE — Progress Notes (Signed)
Office: [phone]  /  Fax: [phone]   HPI:   Chief Complaint: OBESITY Courtney [name] is here to discuss her progress with her obesity treatment plan. She is on the Category 1 plan and is following her eating plan approximately 99 % of the time. She states she is dancing and walking for 30-60 minutes 3 times per week. [name] continues to do well with weight loss on her Category 1 plan. Hunger is mostly controlled. She is working on making lower cost choices and has decreased eating out as a result.  Her weight is 149 lb (67.6 kg) today and has had a weight loss of 2 pounds over a period of 2 to 3 weeks since her last visit. She has lost 8 lbs since starting treatment with [place].  Vitamin D Deficiency Courtney [name] has a diagnosis of vitamin D deficiency. She is stable on prescription Vit D, not yet at goal. She denies nausea, vomiting or muscle weakness.  ALLERGIES: Allergies  Allergen Reactions  . Erythromycin Hives  . Penicillins Hives  . Shellfish Allergy Hives    MEDICATIONS: Current Outpatient Medications on File Prior to Visit  Medication Sig Dispense Refill  . acetaminophen (TYLENOL) 500 MG tablet Take 1,000 mg by mouth every 6 (six) hours as needed (Pain).     . Alpha-D-Galactosidase (BEANO) TABS Take 1 tablet by mouth daily.    [name] aspirin 81 MG tablet Take 81 mg by mouth daily.    . clobetasol cream (TEMOVATE) 0.05 % clobetasol 0.05 % topical cream    . clotrimazole-betamethasone (LOTRISONE) cream     . diclofenac sodium (VOLTAREN) 1 % GEL Apply 2 g topically 4 (four) times daily. Rub into affected area of foot 2 to 4 times daily 100 g 2  . Digestive Enzymes CAPS Take 1 capsule by mouth daily.    [name] ezetimibe (ZETIA) 10 MG tablet TAKE 1 TABLET (10 MG TOTAL) BY MOUTH DAILY. 90 tablet 3  . metoprolol tartrate (LOPRESSOR) 25 MG tablet TAKE 1/2 TABLET TWICE DAILY 90 tablet 3  . nitroGLYCERIN (NITROSTAT) 0.4 MG SL tablet Place 1 tablet (0.4 mg total) under the tongue every 5 (five) minutes as  needed for chest pain. 25 tablet 3  . omeprazole (PRILOSEC) 40 MG capsule Take 1 capsule (40 mg total) by mouth daily. 90 capsule 3  . ramipril (ALTACE) 5 MG capsule TAKE 1 CAPSULE EVERY DAY 90 capsule 3  . rosuvastatin (CRESTOR) 40 MG tablet TAKE 1 TABLET EVERY DAY 90 tablet 2  . Vitamin D, Ergocalciferol, (DRISDOL) 50000 units CAPS capsule Take 1 capsule (50,000 Units total) by mouth every 7 (seven) days. 4 capsule 0   No current facility-administered medications on file prior to visit.     PAST MEDICAL HISTORY: Past Medical History:  Diagnosis Date  . Aortic valve sclerosis    echo 63/3354, soft systolic murmur  . 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 Back pain   . Carotid artery disease ([place])    40-59% bilateral, doppler December 2010  . Constipation   . Coronary artery disease    a. BMS-mid RCA 11/2007 b. stress echo 02/2009 subtle inferior HK, lateral ST depressions with stress c. follow-up cath 02/2009: RCA stent patent, severe but stable stenosis of distal posterior lateral branch RCA. LV normal, med tx unless more sx c. ETT Myoview 08/27/12: negative for ischemia; EF 64%  . Decreased hearing   . Depression    Mild, August, 2012  . Dyslipidemia   . Easy  bruising   . Ejection fraction    EF normal, catheterization, 2011 /  EF 55%, echo, 2010  . Fluid overload    Mild, stable since hospitalization in the past  . GERD (gastroesophageal reflux disease)    occarionally, takes Pepcid over the counter  . Heart murmur    has, occasional PVC's  . History of right hip replacement   . Hyperlipidemia   . Hypertension   . Joint pain   . Myocardial infarction ([place])    2009  . Palpitations   . PVC (premature ventricular contraction)    per prior Holter monitor  . Right hip pain 12/2009  . Shellfish allergy   . Trouble in sleeping   . Vitamin B12 deficiency   . Vitamin D deficiency     PAST SURGICAL HISTORY: Past Surgical History:  Procedure Laterality Date  . BREAST BIOPSY Left  08/01/2005  . carcinoid tumor removal    . CARDIAC CATHETERIZATION  1/211   Patent RCA stent, stenotic PLB supplying small distribution; medically managed  . CORONARY ANGIOPLASTY WITH STENT PLACEMENT  11/2007   BMS-mid RCA  . DIAGNOSTIC LAPAROSCOPY     1982 for endometriosis  . DILATION AND CURETTAGE OF UTERUS  2005  . hx of wisdom teeth surgery    . SKIN CANCER EXCISION    . TOTAL HIP ARTHROPLASTY Right 07/08/2014   Procedure: RIGHT TOTAL HIP ARTHROPLASTY ANTERIOR APPROACH;  Surgeon: Mcarthur Rossetti, MD;  Location: [facility];  Service: Orthopedics;  Laterality: Right;    SOCIAL HISTORY: Social History   Tobacco Use  . Smoking status: Never Smoker  . Smokeless tobacco: Never Used  Substance Use Topics  . Alcohol use: Yes    Alcohol/week: 4.2 oz    Types: 7 Glasses of wine per week    Comment: 1 glass of wine/night  . Drug use: No    FAMILY HISTORY: Family History  Problem Relation Age of Onset  . Heart attack Father 58  . Hyperlipidemia Father   . Hypertension Father   . Sudden death Father   . Stroke Mother   . Hyperlipidemia Mother   . Hypertension Mother   . Heart disease Mother   . Obesity Mother   . Heart attack Paternal Grandfather   . Heart disease Sister   . BRCA 1/2 Neg Hx   . Breast cancer Neg Hx     ROS: Review of Systems  Constitutional: Positive for weight loss.  Gastrointestinal: Negative for nausea and vomiting.  Musculoskeletal:       Negative muscle weakness    PHYSICAL EXAM: Blood pressure (!) 115/53, pulse (!) 49, temperature 97.9 F (36.6 C), temperature source Oral, height 5' (1.524 m), weight 149 lb (67.6 kg), SpO2 97 %. Body mass index is 29.1 kg/m. Physical Exam  Constitutional: She is oriented to person, place, and time. She appears well-developed and well-nourished.  Cardiovascular: Normal rate.  Pulmonary/Chest: Effort normal.  Musculoskeletal: Normal range of motion.  Neurological: She is oriented to person, place, and  time.  Skin: Skin is warm and dry.  Psychiatric: She has a normal mood and affect. Her behavior is normal.  Vitals reviewed.   RECENT LABS AND TESTS: BMET    Component Value Date/Time   NA 141 06/16/2017 0000   K 4.9 06/16/2017 0000   CL 103 06/16/2017 0000   CO2 24 06/16/2017 0000   GLUCOSE 86 06/16/2017 0000   GLUCOSE 96 11/30/2015 1028   BUN 22 06/16/2017 0000  CREATININE 0.86 06/16/2017 0000   CALCIUM 9.6 06/16/2017 0000   GFRNONAA 70 06/16/2017 0000   GFRAA 80 06/16/2017 0000   Lab Results  Component Value Date   HGBA1C 5.6 06/16/2017   Lab Results  Component Value Date   INSULIN 6.4 06/16/2017   CBC    Component Value Date/Time   WBC 8.2 02/25/2017 1055   RBC 4.12 02/25/2017 1055   HGB 13.0 02/25/2017 1055   HCT 38.1 02/25/2017 1055   PLT 226.0 02/25/2017 1055   MCV 92.6 02/25/2017 1055   MCH 30.8 07/10/2014 0440   MCHC 34.1 02/25/2017 1055   RDW 12.9 02/25/2017 1055   LYMPHSABS 2.5 03/10/2009 0922   MONOABS 0.4 03/10/2009 0922   EOSABS 0.2 03/10/2009 0922   BASOSABS 0.0 03/10/2009 0922   Iron/TIBC/Ferritin/ %Sat    Component Value Date/Time   IRON 62 01/09/2015 0806   FERRITIN 26.6 01/09/2015 0806   IRONPCTSAT 15.2 (L) 01/09/2015 0806   Lipid Panel     Component Value Date/Time   CHOL 161 06/16/2017 0000   TRIG 128 06/16/2017 0000   HDL 61 06/16/2017 0000   CHOLHDL 2.5 10/29/2016 1030   CHOLHDL 3 11/30/2015 1028   VLDL 25.4 11/30/2015 1028   LDLCALC 74 06/16/2017 0000   Hepatic Function Panel     Component Value Date/Time   PROT 7.1 06/16/2017 0000   ALBUMIN 4.7 06/16/2017 0000   AST 27 06/16/2017 0000   ALT 20 06/16/2017 0000   ALKPHOS 88 06/16/2017 0000   BILITOT 0.3 06/16/2017 0000   BILIDIR 0.07 10/29/2016 1030      Component Value Date/Time   TSH 0.931 06/16/2017 0000   TSH 1.43 01/09/2015 0806   TSH 0.876 08/26/2012 1722  Results for RABIA, ARGOTE (MRN [id number]) as of 08/11/2017 15:22  Ref. Range 06/16/2017 00:00    Vitamin D, 25-Hydroxy Latest Ref Range: 30.0 - 100.0 ng/mL 37.1    ASSESSMENT AND PLAN: Vitamin D deficiency - Plan: Vitamin D, Ergocalciferol, (DRISDOL) 50000 units CAPS capsule  Class 1 obesity with serious comorbidity and body mass index (BMI) of 30.0 to 30.9 in adult, unspecified obesity type  PLAN:  Vitamin D Deficiency Rayelle was informed that low vitamin D levels contributes to fatigue and are associated with obesity, breast, and colon cancer. Admire agrees to continue taking prescription Vit D _0 ,000 IU every week #4 and we will refill for 1 month. She will follow up for routine testing of vitamin D, at least 2-3 times per year. She was informed of the risk of over-replacement of vitamin D and agrees to not increase her dose unless she discusses this with [place] first. Marillyn agrees to follow up with our clinic in 2 to 3 weeks.  Obesity Bonnye is currently in the action stage of change. As such, her goal is to continue with weight loss efforts She has agreed to follow the Category 1 plan Niki has been instructed to work up to a goal of 150 minutes of combined cardio and strengthening exercise per week for weight loss and overall health benefits. We discussed the following Behavioral Modification Strategies today: increasing lean protein intake and decreasing simple carbohydrates    Jarielys has agreed to follow up with our clinic in 2 to 3 weeks. She was informed of the importance of frequent follow up visits to maximize her success with intensive lifestyle modifications for her multiple health conditions.   OBESITY BEHAVIORAL INTERVENTION VISIT  Today's visit was # 4 out of 22.  Starting weight: 157 lbs Starting date: 06/16/17 Today's weight : 149 lbs  Today's date: 08/11/2017 Total lbs lost to date: 8 (Patients must lose 7 lbs in the first 6 months to continue with counseling)   ASK: We discussed the diagnosis of obesity with [name] today and Marella agreed to give [place]  permission to discuss obesity behavioral modification therapy today.  ASSESS: Kalese has the diagnosis of obesity and her BMI today is 29.1 Kayle is in the action stage of change   ADVISE: Kaydi was educated on the multiple health risks of obesity as well as the benefit of weight loss to improve her health. She was advised of the need for long term treatment and the importance of lifestyle modifications.  AGREE: Multiple dietary modification options and treatment options were discussed and  Dhalia agreed to the above obesity treatment plan.  I, Trixie Dredge, am acting as transcriptionist for Dennard Nip, MD  I have reviewed the above documentation for accuracy and completeness, and I agree with the above. -Dennard Nip, MD

## 2017-08-27 ENCOUNTER — Ambulatory Visit (INDEPENDENT_AMBULATORY_CARE_PROVIDER_SITE_OTHER): Payer: Medicare HMO | Admitting: Physician Assistant

## 2017-08-27 VITALS — BP 89/53 | HR 54 | Temp 98.3°F | Ht 60.0 in | Wt 147.0 lb

## 2017-08-27 DIAGNOSIS — R001 Bradycardia, unspecified: Secondary | ICD-10-CM

## 2017-08-27 DIAGNOSIS — I10 Essential (primary) hypertension: Secondary | ICD-10-CM | POA: Diagnosis not present

## 2017-08-27 DIAGNOSIS — Z6830 Body mass index (BMI) 30.0-30.9, adult: Secondary | ICD-10-CM

## 2017-08-27 DIAGNOSIS — E669 Obesity, unspecified: Secondary | ICD-10-CM | POA: Diagnosis not present

## 2017-08-27 DIAGNOSIS — E66811 Obesity, class 1: Secondary | ICD-10-CM

## 2017-08-27 NOTE — Progress Notes (Signed)
Office: [phone]  /  Fax: [phone]   HPI:   Chief Complaint: OBESITY Courtney [name] is here to discuss her progress with her obesity treatment plan. She is on the Category 1 plan and is following her eating plan approximately 93 % of the time. She states she is dancing and gardening for 45 minutes 4 times per week. Courtney [name] continues to do well with weight loss. She is mindful of her eating and states her hunger is well controlled.  Her weight is 147 lb (66.7 kg) today and has had a weight loss of 2 pounds over a period of 2 weeks since her last visit. She has lost 10 lbs since starting treatment with [place].  Hypertension Courtney [name] is a 67 y.o. female with hypertension. Courtney [name]'s blood pressure is low. She has a history of coronary artery disease, on metoprolol and ramipril. Heart rate is low at 54. She relates increased fatigue and "napping more". She is working weight loss to help control her blood pressure with the goal of decreasing her risk of heart attack and stroke. Courtney [name]'s blood pressure is not currently controlled.  Bradycardia HR low. Pt on lopressor. Denies any symptoms.   ALLERGIES: Allergies  Allergen Reactions  . Erythromycin Hives  . Penicillins Hives  . Shellfish Allergy Hives    MEDICATIONS: Current Outpatient Medications on File Prior to Visit  Medication Sig Dispense Refill  . acetaminophen (TYLENOL) 500 MG tablet Take 1,000 mg by mouth every 6 (six) hours as needed (Pain).     . Alpha-D-Galactosidase (BEANO) TABS Take 1 tablet by mouth daily.    [name] aspirin 81 MG tablet Take 81 mg by mouth daily.    . clobetasol cream (TEMOVATE) 0.05 % clobetasol 0.05 % topical cream    . clotrimazole-betamethasone (LOTRISONE) cream     . diclofenac sodium (VOLTAREN) 1 % GEL Apply 2 g topically 4 (four) times daily. Rub into affected area of foot 2 to 4 times daily 100 g 2  . Digestive Enzymes CAPS Take 1 capsule by mouth daily.    [name] ezetimibe (ZETIA) 10 MG tablet TAKE 1 TABLET (10  MG TOTAL) BY MOUTH DAILY. 90 tablet 3  . metoprolol tartrate (LOPRESSOR) 25 MG tablet TAKE 1/2 TABLET TWICE DAILY 90 tablet 3  . nitroGLYCERIN (NITROSTAT) 0.4 MG SL tablet Place 1 tablet (0.4 mg total) under the tongue every 5 (five) minutes as needed for chest pain. 25 tablet 3  . omeprazole (PRILOSEC) 40 MG capsule Take 1 capsule (40 mg total) by mouth daily. 90 capsule 3  . ramipril (ALTACE) 5 MG capsule TAKE 1 CAPSULE EVERY DAY 90 capsule 3  . rosuvastatin (CRESTOR) 40 MG tablet TAKE 1 TABLET EVERY DAY 90 tablet 2  . Vitamin D, Ergocalciferol, (DRISDOL) 50000 units CAPS capsule Take 1 capsule (50,000 Units total) by mouth every 7 (seven) days. 4 capsule 0   No current facility-administered medications on file prior to visit.     PAST MEDICAL HISTORY: Past Medical History:  Diagnosis Date  . Aortic valve sclerosis    echo 48/1856, soft systolic murmur  . 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 Back pain   . Carotid artery disease ([place])    40-59% bilateral, doppler December 2010  . Constipation   . Coronary artery disease    a. BMS-mid RCA 11/2007 b. stress echo 02/2009 subtle inferior HK, lateral ST depressions with stress c. follow-up cath 02/2009: RCA stent patent, severe but stable stenosis of distal posterior lateral  branch RCA. LV normal, med tx unless more sx c. ETT Myoview 08/27/12: negative for ischemia; EF 64%  . Decreased hearing   . Depression    Mild, August, 2012  . Dyslipidemia   . Easy bruising   . Ejection fraction    EF normal, catheterization, 2011 /  EF 55%, echo, 2010  . Fluid overload    Mild, stable since hospitalization in the past  . GERD (gastroesophageal reflux disease)    occarionally, takes Pepcid over the counter  . Heart murmur    has, occasional PVC's  . History of right hip replacement   . Hyperlipidemia   . Hypertension   . Joint pain   . Myocardial infarction ([place])    2009  . Palpitations   . PVC (premature ventricular contraction)    per prior  Holter monitor  . Right hip pain 12/2009  . Shellfish allergy   . Trouble in sleeping   . Vitamin B12 deficiency   . Vitamin D deficiency     PAST SURGICAL HISTORY: Past Surgical History:  Procedure Laterality Date  . 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/01/2005  . carcinoid tumor removal    . CARDIAC CATHETERIZATION  1/211   Patent RCA stent, stenotic PLB supplying small distribution; medically managed  . CORONARY ANGIOPLASTY WITH STENT PLACEMENT  11/2007   BMS-mid RCA  . DIAGNOSTIC LAPAROSCOPY     1982 for endometriosis  . DILATION AND CURETTAGE OF UTERUS  2005  . hx of wisdom teeth surgery    . SKIN CANCER EXCISION    . TOTAL HIP ARTHROPLASTY Right 07/08/2014   Procedure: RIGHT TOTAL HIP ARTHROPLASTY ANTERIOR APPROACH;  Surgeon: Mcarthur Rossetti, MD;  Location: [facility];  Service: Orthopedics;  Laterality: Right;    SOCIAL HISTORY: Social History   Tobacco Use  . Smoking status: Never Smoker  . Smokeless tobacco: Never Used  Substance Use Topics  . Alcohol use: Yes    Alcohol/week: 4.2 oz    Types: 7 Glasses of wine per week    Comment: 1 glass of wine/night  . Drug use: No    FAMILY HISTORY: Family History  Problem Relation Age of Onset  . Heart attack Father 13  . Hyperlipidemia Father   . Hypertension Father   . Sudden death Father   . Stroke Mother   . Hyperlipidemia Mother   . Hypertension Mother   . Heart disease Mother   . Obesity Mother   . Heart attack Paternal Grandfather   . Heart disease Sister   . BRCA 1/2 Neg Hx   . Breast cancer Neg Hx     ROS: Review of Systems  Constitutional: Positive for malaise/fatigue and weight loss.    PHYSICAL EXAM: Blood pressure (!) 89/53, pulse (!) 54, temperature 98.3 F (36.8 C), temperature source Oral, height 5' (1.524 m), weight 147 lb (66.7 kg), SpO2 97 %. Body mass index is 28.71 kg/m. Physical Exam  Constitutional: She is oriented to person, place, and time. She appears well-developed and well-nourished.    Cardiovascular: Bradycardia present.  Pulmonary/Chest: Effort normal.  Musculoskeletal: Normal range of motion.  Neurological: She is oriented to person, place, and time.  Skin: Skin is warm and dry.  Psychiatric: She has a normal mood and affect. Her behavior is normal.  Vitals reviewed.   RECENT LABS AND TESTS: BMET    Component Value Date/Time   NA 141 06/16/2017 0000   K 4.9 06/16/2017 0000   CL 103 06/16/2017 0000  CO2 24 06/16/2017 0000   GLUCOSE 86 06/16/2017 0000   GLUCOSE 96 11/30/2015 1028   BUN 22 06/16/2017 0000   CREATININE 0.86 06/16/2017 0000   CALCIUM 9.6 06/16/2017 0000   GFRNONAA 70 06/16/2017 0000   GFRAA 80 06/16/2017 0000   Lab Results  Component Value Date   HGBA1C 5.6 06/16/2017   Lab Results  Component Value Date   INSULIN 6.4 06/16/2017   CBC    Component Value Date/Time   WBC 8.2 02/25/2017 1055   RBC 4.12 02/25/2017 1055   HGB 13.0 02/25/2017 1055   HCT 38.1 02/25/2017 1055   PLT 226.0 02/25/2017 1055   MCV 92.6 02/25/2017 1055   MCH 30.8 07/10/2014 0440   MCHC 34.1 02/25/2017 1055   RDW 12.9 02/25/2017 1055   LYMPHSABS 2.5 03/10/2009 0922   MONOABS 0.4 03/10/2009 0922   EOSABS 0.2 03/10/2009 0922   BASOSABS 0.0 03/10/2009 0922   Iron/TIBC/Ferritin/ %Sat    Component Value Date/Time   IRON 62 01/09/2015 0806   FERRITIN 26.6 01/09/2015 0806   IRONPCTSAT 15.2 (L) 01/09/2015 0806   Lipid Panel     Component Value Date/Time   CHOL 161 06/16/2017 0000   TRIG 128 06/16/2017 0000   HDL 61 06/16/2017 0000   CHOLHDL 2.5 10/29/2016 1030   CHOLHDL 3 11/30/2015 1028   VLDL 25.4 11/30/2015 1028   LDLCALC 74 06/16/2017 0000   Hepatic Function Panel     Component Value Date/Time   PROT 7.1 06/16/2017 0000   ALBUMIN 4.7 06/16/2017 0000   AST 27 06/16/2017 0000   ALT 20 06/16/2017 0000   ALKPHOS 88 06/16/2017 0000   BILITOT 0.3 06/16/2017 0000   BILIDIR 0.07 10/29/2016 1030      Component Value Date/Time   TSH 0.931  06/16/2017 0000   TSH 1.43 01/09/2015 0806   TSH 0.876 08/26/2012 1722    ASSESSMENT AND PLAN: Essential hypertension  Class 1 obesity with serious comorbidity and body mass index (BMI) of 30.0 to 30.9 in adult, unspecified obesity type - beginning BMI >30  PLAN:  Hypertension We discussed sodium restriction, working on healthy weight loss, and a regular exercise program as the means to achieve improved blood pressure control. Courtney [name] agreed with this plan and agreed to follow up as directed. We will continue to monitor her blood pressure as well as her progress with the above lifestyle modifications. Courtney [name] agrees to continue her medications and will watch for signs of hypotension as she continues her lifestyle modifications. She is to contact Cardiology for any adjustment of blood pressure medications. Courtney [name] agrees to follow up with our clinic in 3 weeks.  Bradycardia Pt advised on following up with her Cardiologist for further management and dose adjustment.  We spent > than 50% of the 15 minute visit on the counseling as documented in the note.  Obesity Courtney [name] is currently in the action stage of change. As such, her goal is to continue with weight loss efforts She has agreed to follow the Category 1 plan Courtney [name] has been instructed to work up to a goal of 150 minutes of combined cardio and strengthening exercise per week for weight loss and overall health benefits. We discussed the following Behavioral Modification Strategies today: emotional eating strategies and increase H20 intake   Courtney [name] has agreed to follow up with our clinic in 3 weeks. She was informed of the importance of frequent follow up visits to maximize her success with intensive lifestyle modifications for her multiple health  conditions.   OBESITY BEHAVIORAL INTERVENTION VISIT  Today's visit was # 5 out of 22.  Starting weight: 157 lbs Starting date: 06/16/17 Today's weight : 147 lbs  Today's date: 08/27/2017 Total  lbs lost to date: 10 (Patients must lose 7 lbs in the first 6 months to continue with counseling)   ASK: We discussed the diagnosis of obesity with Courtney [name] today and Courtney [name] agreed to give [place] permission to discuss obesity behavioral modification therapy today.  ASSESS: Kella has the diagnosis of obesity and her BMI today is 28.71 Shenna is in the action stage of change   ADVISE: Courtney [name] was educated on the multiple health risks of obesity as well as the benefit of weight loss to improve her health. She was advised of the need for long term treatment and the importance of lifestyle modifications.  AGREE: Multiple dietary modification options and treatment options were discussed and  Courtney [name] agreed to the above obesity treatment plan.   Wilhemena Durie, am acting as transcriptionist for Lacy Duverney, PA-C I, Lacy Duverney [facility], have reviewed this note and agree with its content

## 2017-08-29 ENCOUNTER — Encounter: Payer: Self-pay | Admitting: Cardiology

## 2017-09-17 ENCOUNTER — Ambulatory Visit (INDEPENDENT_AMBULATORY_CARE_PROVIDER_SITE_OTHER): Payer: Medicare HMO | Admitting: Family Medicine

## 2017-09-17 VITALS — BP 108/52 | HR 46 | Temp 97.7°F | Ht 60.0 in | Wt 143.0 lb

## 2017-09-17 DIAGNOSIS — Z6830 Body mass index (BMI) 30.0-30.9, adult: Secondary | ICD-10-CM

## 2017-09-17 DIAGNOSIS — I10 Essential (primary) hypertension: Secondary | ICD-10-CM

## 2017-09-17 DIAGNOSIS — E559 Vitamin D deficiency, unspecified: Secondary | ICD-10-CM | POA: Diagnosis not present

## 2017-09-17 DIAGNOSIS — E669 Obesity, unspecified: Secondary | ICD-10-CM

## 2017-09-17 MED ORDER — VITAMIN D (ERGOCALCIFEROL) 1.25 MG (50000 UNIT) PO CAPS: 50000.0000 [IU] | ORAL_CAPSULE | ORAL | 0 refills | Status: DC

## 2017-09-18 NOTE — Progress Notes (Signed)
Office: [phone]  /  Fax: [phone]   HPI:   Chief Complaint: OBESITY Courtney [name] is here to discuss her progress with her obesity treatment plan. She is on the Category 1 plan and is following her eating plan approximately 90 % of the time. She states she is dancing 45 minutes 1 time per week. Courtney [name] did very well with weight loss. She reports that she packed food and planned meals during her most recent travel. Her weight is 143 lb (64.9 kg) today and has had a weight loss of 4 pounds over a period of 3 weeks since her last visit. She has lost 14 lbs since starting treatment with [place].  Vitamin D deficiency Courtney [name] has a diagnosis of vitamin D deficiency. She is currently taking prescription vit D and denies nausea, vomiting or muscle weakness.  Hypertension Courtney [name] is a 67 y.o. female with hypertension.  Courtney [name] denies chest pain or shortness of breath on exertion. She is working weight loss to help control her blood pressure with the goal of decreasing her risk of heart attack and stroke. Courtney [name] blood pressure is currently controlled.    ALLERGIES: Allergies  Allergen Reactions  . Erythromycin Hives  . Penicillins Hives  . Shellfish Allergy Hives    MEDICATIONS: Current Outpatient Medications on File Prior to Visit  Medication Sig Dispense Refill  . acetaminophen (TYLENOL) 500 MG tablet Take 1,000 mg by mouth every 6 (six) hours as needed (Pain).     . Alpha-D-Galactosidase (BEANO) TABS Take 1 tablet by mouth daily.    [name] aspirin 81 MG tablet Take 81 mg by mouth daily.    . clobetasol cream (TEMOVATE) 0.05 % clobetasol 0.05 % topical cream    . clotrimazole-betamethasone (LOTRISONE) cream     . diclofenac sodium (VOLTAREN) 1 % GEL Apply 2 g topically 4 (four) times daily. Rub into affected area of foot 2 to 4 times daily 100 g 2  . Digestive Enzymes CAPS Take 1 capsule by mouth daily.    [name] ezetimibe (ZETIA) 10 MG tablet TAKE 1 TABLET (10 MG TOTAL) BY MOUTH DAILY. 90  tablet 3  . metoprolol tartrate (LOPRESSOR) 25 MG tablet TAKE 1/2 TABLET TWICE DAILY 90 tablet 3  . nitroGLYCERIN (NITROSTAT) 0.4 MG SL tablet Place 1 tablet (0.4 mg total) under the tongue every 5 (five) minutes as needed for chest pain. 25 tablet 3  . omeprazole (PRILOSEC) 40 MG capsule Take 1 capsule (40 mg total) by mouth daily. 90 capsule 3  . ramipril (ALTACE) 5 MG capsule TAKE 1 CAPSULE EVERY DAY 90 capsule 3  . rosuvastatin (CRESTOR) 40 MG tablet TAKE 1 TABLET EVERY DAY 90 tablet 2   No current facility-administered medications on file prior to visit.     PAST MEDICAL HISTORY: Past Medical History:  Diagnosis Date  . Aortic valve sclerosis    echo 79/0240, soft systolic murmur  . 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 Back pain   . Carotid artery disease ([place])    40-59% bilateral, doppler December 2010  . Constipation   . Coronary artery disease    a. BMS-mid RCA 11/2007 b. stress echo 02/2009 subtle inferior HK, lateral ST depressions with stress c. follow-up cath 02/2009: RCA stent patent, severe but stable stenosis of distal posterior lateral branch RCA. LV normal, med tx unless more sx c. ETT Myoview 08/27/12: negative for ischemia; EF 64%  . Decreased hearing   . Depression    Mild,  August, 2012  . Dyslipidemia   . Easy bruising   . Ejection fraction    EF normal, catheterization, 2011 /  EF 55%, echo, 2010  . Fluid overload    Mild, stable since hospitalization in the past  . GERD (gastroesophageal reflux disease)    occarionally, takes Pepcid over the counter  . Heart murmur    has, occasional PVC's  . History of right hip replacement   . Hyperlipidemia   . Hypertension   . Joint pain   . Myocardial infarction ([place])    2009  . Palpitations   . PVC (premature ventricular contraction)    per prior Holter monitor  . Right hip pain 12/2009  . Shellfish allergy   . Trouble in sleeping   . Vitamin B12 deficiency   . Vitamin D deficiency     PAST SURGICAL  HISTORY: Past Surgical History:  Procedure Laterality Date  . 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/01/2005  . carcinoid tumor removal    . CARDIAC CATHETERIZATION  1/211   Patent RCA stent, stenotic PLB supplying small distribution; medically managed  . CORONARY ANGIOPLASTY WITH STENT PLACEMENT  11/2007   BMS-mid RCA  . DIAGNOSTIC LAPAROSCOPY     1982 for endometriosis  . DILATION AND CURETTAGE OF UTERUS  2005  . hx of wisdom teeth surgery    . SKIN CANCER EXCISION    . TOTAL HIP ARTHROPLASTY Right 07/08/2014   Procedure: RIGHT TOTAL HIP ARTHROPLASTY ANTERIOR APPROACH;  Surgeon: Mcarthur Rossetti, MD;  Location: [facility];  Service: Orthopedics;  Laterality: Right;    SOCIAL HISTORY: Social History   Tobacco Use  . Smoking status: Never Smoker  . Smokeless tobacco: Never Used  Substance Use Topics  . Alcohol use: Yes    Alcohol/week: 4.2 oz    Types: 7 Glasses of wine per week    Comment: 1 glass of wine/night  . Drug use: No    FAMILY HISTORY: Family History  Problem Relation Age of Onset  . Heart attack Father 79  . Hyperlipidemia Father   . Hypertension Father   . Sudden death Father   . Stroke Mother   . Hyperlipidemia Mother   . Hypertension Mother   . Heart disease Mother   . Obesity Mother   . Heart attack Paternal Grandfather   . Heart disease Sister   . BRCA 1/2 Neg Hx   . Breast cancer Neg Hx     ROS: Review of Systems  Constitutional: Positive for weight loss.  Respiratory: Negative for shortness of breath (on exertion).   Cardiovascular: Negative for chest pain.  Gastrointestinal: Negative for nausea and vomiting.  Musculoskeletal:       Negative for muscle weakness    PHYSICAL EXAM: Blood pressure (!) 108/52, pulse (!) 46, temperature 97.7 F (36.5 C), temperature source Oral, height 5' (1.524 m), weight 143 lb (64.9 kg), SpO2 98 %. Body mass index is 27.93 kg/m. Physical Exam  Constitutional: She is oriented to person, place, and time. She appears  well-developed and well-nourished.  Cardiovascular: Normal rate.  Pulmonary/Chest: Effort normal.  Musculoskeletal: Normal range of motion.  Neurological: She is oriented to person, place, and time.  Skin: Skin is warm and dry.  Psychiatric: She has a normal mood and affect. Her behavior is normal.  Vitals reviewed.   RECENT LABS AND TESTS: BMET    Component Value Date/Time   NA 141 06/16/2017 0000   K 4.9 06/16/2017 0000   CL 103 06/16/2017 0000  CO2 24 06/16/2017 0000   GLUCOSE 86 06/16/2017 0000   GLUCOSE 96 11/30/2015 1028   BUN 22 06/16/2017 0000   CREATININE 0.86 06/16/2017 0000   CALCIUM 9.6 06/16/2017 0000   GFRNONAA 70 06/16/2017 0000   GFRAA 80 06/16/2017 0000   Lab Results  Component Value Date   HGBA1C 5.6 06/16/2017   Lab Results  Component Value Date   INSULIN 6.4 06/16/2017   CBC    Component Value Date/Time   WBC 8.2 02/25/2017 1055   RBC 4.12 02/25/2017 1055   HGB 13.0 02/25/2017 1055   HCT 38.1 02/25/2017 1055   PLT 226.0 02/25/2017 1055   MCV 92.6 02/25/2017 1055   MCH 30.8 07/10/2014 0440   MCHC 34.1 02/25/2017 1055   RDW 12.9 02/25/2017 1055   LYMPHSABS 2.5 03/10/2009 0922   MONOABS 0.4 03/10/2009 0922   EOSABS 0.2 03/10/2009 0922   BASOSABS 0.0 03/10/2009 0922   Iron/TIBC/Ferritin/ %Sat    Component Value Date/Time   IRON 62 01/09/2015 0806   FERRITIN 26.6 01/09/2015 0806   IRONPCTSAT 15.2 (L) 01/09/2015 0806   Lipid Panel     Component Value Date/Time   CHOL 161 06/16/2017 0000   TRIG 128 06/16/2017 0000   HDL 61 06/16/2017 0000   CHOLHDL 2.5 10/29/2016 1030   CHOLHDL 3 11/30/2015 1028   VLDL 25.4 11/30/2015 1028   LDLCALC 74 06/16/2017 0000   Hepatic Function Panel     Component Value Date/Time   PROT 7.1 06/16/2017 0000   ALBUMIN 4.7 06/16/2017 0000   AST 27 06/16/2017 0000   ALT 20 06/16/2017 0000   ALKPHOS 88 06/16/2017 0000   BILITOT 0.3 06/16/2017 0000   BILIDIR 0.07 10/29/2016 1030      Component Value  Date/Time   TSH 0.931 06/16/2017 0000   TSH 1.43 01/09/2015 0806   TSH 0.876 08/26/2012 1722   Results for ROBENA, EWY (MRN [id number]) as of 09/18/2017 11:30  Ref. Range 06/16/2017 00:00  Vitamin D, 25-Hydroxy Latest Ref Range: 30.0 - 100.0 ng/mL 37.1   ASSESSMENT AND PLAN: Vitamin D deficiency - Plan: Vitamin D, Ergocalciferol, (DRISDOL) 50000 units CAPS capsule  Essential hypertension  Class 1 obesity with serious comorbidity and body mass index (BMI) of 30.0 to 30.9 in adult, unspecified obesity type - Starting BMI greater then 30  PLAN:  Vitamin D Deficiency Courtney [name] was informed that low vitamin D levels contributes to fatigue and are associated with obesity, breast, and colon cancer. She agrees to continue to take prescription Vit D '@50'$ ,000 IU every week #4 with no refills and will follow up for routine testing of vitamin D, at least 2-3 times per year. She was informed of the risk of over-replacement of vitamin D and agrees to not increase her dose unless she discusses this with [place] first. Courtney [name] agrees to follow up as directed.  Hypertension We discussed sodium restriction, working on healthy weight loss, and a regular exercise program as the means to achieve improved blood pressure control. Courtney [name] agreed with this plan and agreed to follow up as directed. We will continue to monitor her blood pressure as well as her progress with the above lifestyle modifications. She will continue her medications as prescribed and will watch for signs of hypotension as she continues her lifestyle modifications.  Obesity Courtney [name] is currently in the action stage of change. As such, her goal is to continue with weight loss efforts She has agreed to follow the Category 1 plan Courtney [name] has been  instructed to work up to a goal of 150 minutes of combined cardio and strengthening exercise per week for weight loss and overall health benefits. Courtney [name] was given an exercise band and upper body exercises to perform. She  is to add 100 calories on the days that she is working out. We discussed the following Behavioral Modification Strategies today: increasing lean protein intake, increasing vegetables and work on meal planning and easy cooking plans  Courtney [name] has agreed to follow up with our clinic in 3 to 4 weeks. She was informed of the importance of frequent follow up visits to maximize her success with intensive lifestyle modifications for her multiple health conditions.   OBESITY BEHAVIORAL INTERVENTION VISIT  Today's visit was # 6 out of 22.  Starting weight: 157 lbs Starting date: 06/16/17 Today's weight : 143 lbs Today's date: 09/17/17 Total lbs lost to date: 14    ASK: We discussed the diagnosis of obesity with Courtney [name] today and Courtney [name] agreed to give [place] permission to discuss obesity behavioral modification therapy today.  ASSESS: Andreika has the diagnosis of obesity and her BMI today is 49.93 [place] is in the action stage of change   ADVISE: Katalena was educated on the multiple health risks of obesity as well as the benefit of weight loss to improve her health. She was advised of the need for long term treatment and the importance of lifestyle modifications.  AGREE: Multiple dietary modification options and treatment options were discussed and  Ylianna agreed to the above obesity treatment plan.  I, Doreene Nest, am acting as transcriptionist for Dennard Nip, MD  I have reviewed the above documentation for accuracy and completeness, and I agree with the above. -Dennard Nip, MD

## 2017-10-02 ENCOUNTER — Ambulatory Visit
Admission: RE | Admit: 2017-10-02 | Discharge: 2017-10-02 | Disposition: A | Discharge status: Home / Self Care | Payer: Medicare HMO | Source: Ambulatory Visit | Attending: Obstetrics and Gynecology | Admitting: Obstetrics and Gynecology

## 2017-10-02 ENCOUNTER — Other Ambulatory Visit: Payer: Self-pay | Admitting: Obstetrics and Gynecology

## 2017-10-02 DIAGNOSIS — N6310 Unspecified lump in the right breast, unspecified quadrant: Secondary | ICD-10-CM | POA: Diagnosis not present

## 2017-10-02 DIAGNOSIS — R922 Inconclusive mammogram: Secondary | ICD-10-CM | POA: Diagnosis not present

## 2017-10-02 DIAGNOSIS — R928 Other abnormal and inconclusive findings on diagnostic imaging of breast: Secondary | ICD-10-CM

## 2017-10-02 DIAGNOSIS — N6001 Solitary cyst of right breast: Secondary | ICD-10-CM

## 2017-10-14 ENCOUNTER — Ambulatory Visit (INDEPENDENT_AMBULATORY_CARE_PROVIDER_SITE_OTHER): Payer: Medicare HMO | Admitting: Family

## 2017-10-14 ENCOUNTER — Telehealth: Payer: Medicare HMO | Admitting: Physician Assistant

## 2017-10-14 ENCOUNTER — Encounter (HOSPITAL_COMMUNITY): Payer: Self-pay

## 2017-10-14 ENCOUNTER — Encounter: Payer: Self-pay | Admitting: Family

## 2017-10-14 ENCOUNTER — Other Ambulatory Visit: Payer: Self-pay

## 2017-10-14 ENCOUNTER — Emergency Department (HOSPITAL_COMMUNITY)
Admission: EM | Admit: 2017-10-14 | Discharge: 2017-10-14 | Disposition: A | Discharge status: Home / Self Care | Payer: Medicare HMO | Attending: Emergency Medicine | Admitting: Emergency Medicine

## 2017-10-14 ENCOUNTER — Emergency Department (HOSPITAL_COMMUNITY): Payer: Medicare HMO

## 2017-10-14 VITALS — BP 138/70 | HR 51 | Temp 98.1°F | Ht 60.0 in | Wt 149.1 lb

## 2017-10-14 DIAGNOSIS — I252 Old myocardial infarction: Secondary | ICD-10-CM | POA: Insufficient documentation

## 2017-10-14 DIAGNOSIS — Z955 Presence of coronary angioplasty implant and graft: Secondary | ICD-10-CM | POA: Diagnosis not present

## 2017-10-14 DIAGNOSIS — R51 Headache: Principal | ICD-10-CM

## 2017-10-14 DIAGNOSIS — I10 Essential (primary) hypertension: Secondary | ICD-10-CM | POA: Insufficient documentation

## 2017-10-14 DIAGNOSIS — R519 Headache, unspecified: Secondary | ICD-10-CM

## 2017-10-14 DIAGNOSIS — Z79899 Other long term (current) drug therapy: Secondary | ICD-10-CM | POA: Diagnosis not present

## 2017-10-14 DIAGNOSIS — Z8679 Personal history of other diseases of the circulatory system: Secondary | ICD-10-CM

## 2017-10-14 DIAGNOSIS — Z85828 Personal history of other malignant neoplasm of skin: Secondary | ICD-10-CM | POA: Insufficient documentation

## 2017-10-14 DIAGNOSIS — Z7982 Long term (current) use of aspirin: Secondary | ICD-10-CM | POA: Diagnosis not present

## 2017-10-14 DIAGNOSIS — I2510 Atherosclerotic heart disease of native coronary artery without angina pectoris: Secondary | ICD-10-CM | POA: Insufficient documentation

## 2017-10-14 DIAGNOSIS — Z96641 Presence of right artificial hip joint: Secondary | ICD-10-CM | POA: Diagnosis not present

## 2017-10-14 LAB — CBC WITH DIFFERENTIAL/PLATELET
BASOS ABS: 0 10*3/uL (ref 0.0–0.1)
BASOS PCT: 0 %
Eosinophils Absolute: 0.2 10*3/uL (ref 0.0–0.7)
Eosinophils Relative: 2 %
HEMATOCRIT: 39.7 % (ref 36.0–46.0)
Hemoglobin: 13.4 g/dL (ref 12.0–15.0)
LYMPHS PCT: 24 %
Lymphs Abs: 2.9 10*3/uL (ref 0.7–4.0)
MCH: 31.8 pg (ref 26.0–34.0)
MCHC: 33.8 g/dL (ref 30.0–36.0)
MCV: 94.3 fL (ref 78.0–100.0)
Monocytes Absolute: 0.8 10*3/uL (ref 0.1–1.0)
Monocytes Relative: 6 %
NEUTROS ABS: 8.4 10*3/uL — AB (ref 1.7–7.7)
Neutrophils Relative %: 68 %
PLATELETS: 235 10*3/uL (ref 150–400)
RBC: 4.21 MIL/uL (ref 3.87–5.11)
RDW: 12.9 % (ref 11.5–15.5)
WBC: 12.2 10*3/uL — AB (ref 4.0–10.5)

## 2017-10-14 LAB — BASIC METABOLIC PANEL
ANION GAP: 11 (ref 5–15)
BUN: 26 mg/dL — ABNORMAL HIGH (ref 8–23)
CALCIUM: 9.7 mg/dL (ref 8.9–10.3)
CO2: 27 mmol/L (ref 22–32)
Chloride: 105 mmol/L (ref 98–111)
Creatinine, Ser: 0.98 mg/dL (ref 0.44–1.00)
GFR, EST NON AFRICAN AMERICAN: 58 mL/min — AB (ref >60)
Glucose, Bld: 92 mg/dL (ref 70–99)
POTASSIUM: 4.1 mmol/L (ref 3.5–5.1)
SODIUM: 143 mmol/L (ref 135–145)

## 2017-10-14 LAB — SEDIMENTATION RATE: Sed Rate: 50 mm/hr — ABNORMAL HIGH (ref 0–22)

## 2017-10-14 MED ORDER — DIPHENHYDRAMINE HCL 50 MG/ML IJ SOLN
12.5000 mg | Freq: Once | INTRAMUSCULAR | Status: AC
  Administered 2017-10-14: 12.5 mg via INTRAVENOUS
  Filled 2017-10-14: qty 1

## 2017-10-14 MED ORDER — METOCLOPRAMIDE HCL 5 MG/ML IJ SOLN
10.0000 mg | Freq: Once | INTRAMUSCULAR | Status: AC
  Administered 2017-10-14: 10 mg via INTRAVENOUS
  Filled 2017-10-14: qty 2

## 2017-10-14 MED ORDER — KETOROLAC TROMETHAMINE 30 MG/ML IJ SOLN
15.0000 mg | Freq: Once | INTRAMUSCULAR | Status: AC
  Administered 2017-10-14: 15 mg via INTRAVENOUS
  Filled 2017-10-14: qty 1

## 2017-10-14 NOTE — Progress Notes (Signed)
Based on what you shared with me it looks like you have a serious condition that should be evaluated in a face to face office visit.  NOTE: If you entered your credit card information for this eVisit, you will not be charged. You may see a "hold" on your card for the $30 but that hold will drop off and you will not have a charge processed.  If you are having a true medical emergency please call 911.  If you need an urgent face to face visit, [facility] has four urgent care centers for your convenience.  If you need care fast and have a high deductible or no insurance consider:   [email or web address] to reserve your spot online an avoid wait times  [facility] [address], [address] [place], [place] [address] 8 am to 8 pm Monday-Friday 10 am to 4 pm Saturday-Sunday *Across the street from [employer]  [facility]  [address] [place], [address] 8 am to 5 pm Monday-Friday * In the [facility]   The following sites will take your  insurance:  . [facility]  [phone] Get Driving Directions Find a Provider at this Location  [address] [place], [place] [address] . 10 am to 8 pm Monday-Friday . 12 pm to 8 pm Saturday-Sunday   . [facility]  [phone] Get Driving Directions Find a Provider at this Location  [address], [address] [place], [place] [address] . 8 am to 8 pm Monday-Friday . 9 am to 6 pm Saturday . 11 am to 6 pm Sunday   . [facility] [place]  [phone] Get Driving Directions  [address] [place],  [address] . 8 am to 8 pm Monday-Friday . 8 am to 4 pm Saturday-Sunday   Your e-visit answers were reviewed by a board certified advanced clinical practitioner to complete your personal care plan.  Thank you for using e-Visits.  

## 2017-10-14 NOTE — Discharge Instructions (Signed)
Your evaluated in the emergency department for a headache.  You had a CAT scan that was unremarkable.  You had one blood test that is sometimes associated with headaches that was elevated and this will require close follow-up with your primary care doctor.  We also placed a ambulatory referral in for neurology.  Please return if any worsening symptoms.

## 2017-10-14 NOTE — ED Provider Notes (Signed)
[facility] DEPT Provider Note   CSN: [id number] Arrival date & time: 10/14/17  1629     History   Chief Complaint Chief Complaint  Patient presents with  . Headache    HPI Courtney [name] is a 67 y.o. female.  She was sent here from her primary care doctor's office for complaints of a headache.  She said she woke up with a bandlike headache yesterday and its been constant since then.  She says is more frontal now when she thought it was her sinuses she tried some sinus medications and a saline rinse without any relief.  She was at the beach last week and she thought she might of gotten some water up in her sinuses.  She comments that the headache is throbbing in nature mostly frontal now not associated with any nausea vomiting or neck pain.  There is been no fever.  She has a little bit of photophobia.  She does not usually get headaches.  Tylenol with minimal relief.  The history is provided by the patient.  Headache   This is a new problem. The current episode started yesterday. The problem occurs constantly. The problem has not changed since onset.The headache is associated with bright light. The pain is moderate. The pain does not radiate. Pertinent negatives include no fever, no syncope, no shortness of breath, no nausea and no vomiting. She has tried acetaminophen for the symptoms. The treatment provided mild relief.    Past Medical History:  Diagnosis Date  . Aortic valve sclerosis    echo 17/0017, soft systolic murmur  . 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 Back pain   . Carotid artery disease ([place])    40-59% bilateral, doppler December 2010  . Constipation   . Coronary artery disease    a. BMS-mid RCA 11/2007 b. stress echo 02/2009 subtle inferior HK, lateral ST depressions with stress c. follow-up cath 02/2009: RCA stent patent, severe but stable stenosis of distal posterior lateral branch RCA. LV normal, med tx unless more sx c. ETT Myoview 08/27/12:  negative for ischemia; EF 64%  . Decreased hearing   . Depression    Mild, August, 2012  . Dyslipidemia   . Easy bruising   . Ejection fraction    EF normal, catheterization, 2011 /  EF 55%, echo, 2010  . Fluid overload    Mild, stable since hospitalization in the past  . GERD (gastroesophageal reflux disease)    occarionally, takes Pepcid over the counter  . Heart murmur    has, occasional PVC's  . History of right hip replacement   . Hyperlipidemia   . Hypertension   . Joint pain   . Myocardial infarction ([place])    2009  . Palpitations   . PVC (premature ventricular contraction)    per prior Holter monitor  . Right hip pain 12/2009  . Shellfish allergy   . Trouble in sleeping   . Vitamin B12 deficiency   . Vitamin D deficiency     Patient Active Problem List   Diagnosis Date Noted  . Obesity 07/15/2017  . Insomnia 07/15/2017  . Female stress incontinence 07/15/2017  . Anxiety 07/15/2017  . Anal fissure 02/25/2017  . Gastroesophageal reflux disease with esophagitis 02/25/2017  . Vertigo 01/05/2016  . Medicare annual wellness visit, subsequent 11/29/2015  . Osteopenia 06/27/2015  . Routine general medical examination at a health care facility 01/06/2015  . Osteoarthritis of right hip 07/08/2014  . Sinus  bradycardia 08/27/2012  . Depression   . Carotid artery disease ([place])   . PVC (premature ventricular contraction)   . Aortic valve sclerosis   . Hyperlipidemia   . Coronary artery disease   . CAD (coronary artery disease)     Past Surgical History:  Procedure Laterality Date  . 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/01/2005  . carcinoid tumor removal    . CARDIAC CATHETERIZATION  1/211   Patent RCA stent, stenotic PLB supplying small distribution; medically managed  . CORONARY ANGIOPLASTY WITH STENT PLACEMENT  11/2007   BMS-mid RCA  . DIAGNOSTIC LAPAROSCOPY     1982 for endometriosis  . DILATION AND CURETTAGE OF UTERUS  2005  . hx of wisdom teeth surgery    . SKIN CANCER  EXCISION    . TOTAL HIP ARTHROPLASTY Right 07/08/2014   Procedure: RIGHT TOTAL HIP ARTHROPLASTY ANTERIOR APPROACH;  Surgeon: Mcarthur Rossetti, MD;  Location: [facility];  Service: Orthopedics;  Laterality: Right;     OB History    Gravida  1   Para  1   Term  1   Preterm      AB      Living  1     SAB      TAB      Ectopic      Multiple      Live Births               Home Medications    Prior to Admission medications   Medication Sig Start Date End Date Taking? Authorizing Provider  acetaminophen (TYLENOL) 500 MG tablet Take 1,000 mg by mouth every 6 (six) hours as needed (Pain).     [provider]  Alpha-D-Galactosidase Satira Mccallum) TABS Take 1 tablet by mouth daily.    [provider]  aspirin 81 MG tablet Take 81 mg by mouth daily.    [provider]  clobetasol cream (TEMOVATE) 0.05 % clobetasol 0.05 % topical cream    [provider]  clotrimazole-betamethasone (LOTRISONE) cream  05/19/17   [provider]  diclofenac sodium (VOLTAREN) 1 % GEL Apply 2 g topically 4 (four) times daily. Rub into affected area of foot 2 to 4 times daily 07/15/17   Trula Slade, DPM  Digestive Enzymes CAPS Take 1 capsule by mouth daily.    [provider]  ezetimibe (ZETIA) 10 MG tablet TAKE 1 TABLET (10 MG TOTAL) BY MOUTH DAILY. 07/28/17 07/23/18  Martinique, Peter M, MD  metoprolol tartrate (LOPRESSOR) 25 MG tablet TAKE 1/2 TABLET TWICE DAILY 07/28/17   [place], Peter M, MD  nitroGLYCERIN (NITROSTAT) 0.4 MG SL tablet Place 1 tablet (0.4 mg total) under the tongue every 5 (five) minutes as needed for chest pain. 08/01/15   [place], Peter M, MD  omeprazole (PRILOSEC) 40 MG capsule Take 1 capsule (40 mg total) by mouth daily. 02/25/17   Lance Sell, NP  ramipril (ALTACE) 5 MG capsule TAKE 1 CAPSULE EVERY DAY 07/28/17   Martin[place], Peter M, MD  rosuvastatin (CRESTOR) 40 MG tablet TAKE 1 TABLET EVERY DAY 02/24/17   [place], Peter M, MD    Vitamin D, Ergocalciferol, (DRISDOL) 50000 units CAPS capsule Take 1 capsule (50,000 Units total) by mouth every 7 (seven) days. 09/17/17   Starlyn Skeans, MD    Family History Family History  Problem Relation Age of Onset  . Heart attack Father 90  . Hyperlipidemia Father   . Hypertension Father   . Sudden death Father   .  Stroke Mother   . Hyperlipidemia Mother   . Hypertension Mother   . Heart disease Mother   . Obesity Mother   . Heart attack Paternal Grandfather   . Heart disease Sister   . BRCA 1/2 Neg Hx   . Breast cancer Neg Hx     Social History Social History   Tobacco Use  . Smoking status: Never Smoker  . Smokeless tobacco: Never Used  Substance Use Topics  . Alcohol use: Yes    Alcohol/week: 7.0 standard drinks    Types: 7 Glasses of wine per week    Comment: 1 glass of wine/night  . Drug use: No     Allergies   Erythromycin; Penicillins; and Shellfish allergy   Review of Systems Review of Systems  Constitutional: Negative for fever.  HENT: Negative for sore throat.   Eyes: Positive for photophobia. Negative for visual disturbance.  Respiratory: Negative for shortness of breath.   Cardiovascular: Negative for chest pain and syncope.  Gastrointestinal: Negative for abdominal pain, nausea and vomiting.  Genitourinary: Negative for dysuria.  Musculoskeletal: Negative for neck pain.  Skin: Negative for rash.  Neurological: Positive for headaches. Negative for syncope and speech difficulty.     Physical Exam Updated Vital Signs BP 114/66 (BP Location: Left Arm)   Pulse (!) 54   Temp 97.6 F (36.4 C) (Oral)   Resp 15   Ht 5' (1.524 m)   Wt 58.5 kg   SpO2 100%   BMI 25.19 kg/m   Physical Exam  Constitutional: She is oriented to person, place, and time. She appears well-developed and well-nourished.  HENT:  Head: Normocephalic and atraumatic.  Mouth/Throat: Oropharynx is clear and moist.  Eyes: Pupils are equal, round, and reactive to  light. Conjunctivae and EOM are normal. Right eye exhibits no nystagmus. Left eye exhibits no nystagmus.  Neck: Normal range of motion. Neck supple. No Brudzinski's sign and no Kernig's sign noted.  Cardiovascular: Normal rate and normal heart sounds.  No murmur heard. Pulmonary/Chest: Effort normal and breath sounds normal. No respiratory distress.  Abdominal: Soft. Bowel sounds are normal. She exhibits no mass. There is no tenderness.  Musculoskeletal: Normal range of motion. She exhibits no edema or tenderness.  Neurological: She is alert and oriented to person, place, and time. She has normal strength. Gait normal. GCS eye subscore is 4. GCS verbal subscore is 5. GCS motor subscore is 6.  Skin: Skin is warm and dry.  Psychiatric: She has a normal mood and affect.     ED Treatments / Results  Labs (all labs ordered are listed, but only abnormal results are displayed) Labs Reviewed  BASIC METABOLIC PANEL - Abnormal; Notable for the following components:      Result Value   BUN 26 (*)    GFR calc non Af Amer 58 (*)    All other components within normal limits  CBC WITH DIFFERENTIAL/PLATELET - Abnormal; Notable for the following components:   WBC 12.2 (*)    Neutro Abs 8.4 (*)    All other components within normal limits  SEDIMENTATION RATE - Abnormal; Notable for the following components:   Sed Rate 50 (*)    All other components within normal limits    EKG None  Radiology Ct Head Wo Contrast  Result Date: 10/14/2017 CLINICAL DATA:  Headache EXAM: CT HEAD WITHOUT CONTRAST TECHNIQUE: Contiguous axial images were obtained from the base of the skull through the vertex without intravenous contrast. COMPARISON:  None. FINDINGS: Brain:  No acute intracranial abnormality. Specifically, no hemorrhage, hydrocephalus, mass lesion, acute infarction, or significant intracranial injury. Vascular: No hyperdense vessel or unexpected calcification. Skull: No acute calvarial abnormality.  Sinuses/Orbits: No acute finding Other: None IMPRESSION: No intracranial abnormality. Electronically Signed   By: Rolm Baptise [name]   On: 10/14/2017 19:21    Procedures Procedures (including critical care time)  Medications Ordered in ED Medications  metoCLOPramide (REGLAN) injection 10 mg (has no administration in time range)  diphenhydrAMINE (BENADRYL) injection 12.5 mg (has no administration in time range)  ketorolac (TORADOL) 30 MG/ML injection 15 mg (has no administration in time range)     Initial Impression / Assessment and Plan / ED Course  I have reviewed the triage vital signs and the nursing notes.  Pertinent labs & imaging results that were available during my care of the patient were reviewed by me and considered in my medical decision making (see chart for details).  Clinical Course as of Oct 16 1519  Tue Oct 14, 2017  2033 Reevaluated patient, she says her headache is much improved.  Her lab work is significant for a white count of 12 and her sed rate is still pending.  Think if her sed rate is unremarkable she likely can be discharged to follow-up with her PCP.   [MB]  2104 Patient's ESR is 50.  She has no symptoms of jaw claudication or other PMR symptoms.  She also has no visual symptoms and no temporal tenderness.  I think is reasonable that she follow-up with her doctor regarding this and have also put in a referral to to outpatient neurology.  She understands to return if any worsening of her symptoms.   [MB]    Clinical Course User Index [MB] Hayden Rasmussen, MD      Final Clinical Impressions(s) / ED Diagnoses   Final diagnoses:  Acute nonintractable headache, unspecified headache type    ED Discharge Orders    None       Hayden Rasmussen, MD 10/15/17 1521

## 2017-10-14 NOTE — ED Triage Notes (Signed)
Patient c/o headache temporal and frontal area since yesterday. patient c/o sensitivity to light. Patient  Denies any blurred vision, N/V.

## 2017-10-14 NOTE — Progress Notes (Signed)
Courtney [name] is a 67 y.o. female with the following history as recorded in Epic[employer]:  Patient Active Problem List   Diagnosis Date Noted  . Obesity 07/15/2017  . Insomnia 07/15/2017  . Female stress incontinence 07/15/2017  . Anxiety 07/15/2017  . Anal fissure 02/25/2017  . Gastroesophageal reflux disease with esophagitis 02/25/2017  . Vertigo 01/05/2016  . Medicare annual wellness visit, subsequent 11/29/2015  . Osteopenia 06/27/2015  . Routine general medical examination at a health care facility 01/06/2015  . Osteoarthritis of right hip 07/08/2014  . Sinus bradycardia 08/27/2012  . Depression   . Carotid artery disease ([place])   . PVC (premature ventricular contraction)   . Aortic valve sclerosis   . Hyperlipidemia   . Coronary artery disease   . CAD (coronary artery disease)     Current Outpatient Medications  Medication Sig Dispense Refill  . acetaminophen (TYLENOL) 500 MG tablet Take 1,000 mg by mouth every 6 (six) hours as needed (Pain).     . Alpha-D-Galactosidase (BEANO) TABS Take 1 tablet by mouth daily.    [name] aspirin 81 MG tablet Take 81 mg by mouth daily.    . clobetasol cream (TEMOVATE) 0.05 % clobetasol 0.05 % topical cream    . clotrimazole-betamethasone (LOTRISONE) cream     . diclofenac sodium (VOLTAREN) 1 % GEL Apply 2 g topically 4 (four) times daily. Rub into affected area of foot 2 to 4 times daily 100 g 2  . Digestive Enzymes CAPS Take 1 capsule by mouth daily.    [name] ezetimibe (ZETIA) 10 MG tablet TAKE 1 TABLET (10 MG TOTAL) BY MOUTH DAILY. 90 tablet 3  . metoprolol tartrate (LOPRESSOR) 25 MG tablet TAKE 1/2 TABLET TWICE DAILY 90 tablet 3  . nitroGLYCERIN (NITROSTAT) 0.4 MG SL tablet Place 1 tablet (0.4 mg total) under the tongue every 5 (five) minutes as needed for chest pain. 25 tablet 3  . omeprazole (PRILOSEC) 40 MG capsule Take 1 capsule (40 mg total) by mouth daily. 90 capsule 3  . ramipril (ALTACE) 5 MG capsule TAKE 1 CAPSULE EVERY DAY 90 capsule 3  .  rosuvastatin (CRESTOR) 40 MG tablet TAKE 1 TABLET EVERY DAY 90 tablet 2  . Vitamin D, Ergocalciferol, (DRISDOL) 50000 units CAPS capsule Take 1 capsule (50,000 Units total) by mouth every 7 (seven) days. 4 capsule 0   No current facility-administered medications for this visit.     Allergies: Erythromycin; Penicillins; and Shellfish allergy  Past Medical History:  Diagnosis Date  . Aortic valve sclerosis    echo 08/8673, soft systolic murmur  . 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 Back pain   . Carotid artery disease ([place])    40-59% bilateral, doppler December 2010  . Constipation   . Coronary artery disease    a. BMS-mid RCA 11/2007 b. stress echo 02/2009 subtle inferior HK, lateral ST depressions with stress c. follow-up cath 02/2009: RCA stent patent, severe but stable stenosis of distal posterior lateral branch RCA. LV normal, med tx unless more sx c. ETT Myoview 08/27/12: negative for ischemia; EF 64%  . Decreased hearing   . Depression    Mild, August, 2012  . Dyslipidemia   . Easy bruising   . Ejection fraction    EF normal, catheterization, 2011 /  EF 55%, echo, 2010  . Fluid overload    Mild, stable since hospitalization in the past  . GERD (gastroesophageal reflux disease)    occarionally, takes Pepcid over the counter  .  Heart murmur    has, occasional PVC's  . History of right hip replacement   . Hyperlipidemia   . Hypertension   . Joint pain   . Myocardial infarction ([place])    2009  . Palpitations   . PVC (premature ventricular contraction)    per prior Holter monitor  . Right hip pain 12/2009  . Shellfish allergy   . Trouble in sleeping   . Vitamin B12 deficiency   . Vitamin D deficiency     Past Surgical History:  Procedure Laterality Date  . 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/01/2005  . carcinoid tumor removal    . CARDIAC CATHETERIZATION  1/211   Patent RCA stent, stenotic PLB supplying small distribution; medically managed  . CORONARY ANGIOPLASTY WITH STENT PLACEMENT   11/2007   BMS-mid RCA  . DIAGNOSTIC LAPAROSCOPY     1982 for endometriosis  . DILATION AND CURETTAGE OF UTERUS  2005  . hx of wisdom teeth surgery    . SKIN CANCER EXCISION    . TOTAL HIP ARTHROPLASTY Right 07/08/2014   Procedure: RIGHT TOTAL HIP ARTHROPLASTY ANTERIOR APPROACH;  Surgeon: Mcarthur Rossetti, MD;  Location: [facility];  Service: Orthopedics;  Laterality: Right;    Family History  Problem Relation Age of Onset  . Heart attack Father 62  . Hyperlipidemia Father   . Hypertension Father   . Sudden death Father   . Stroke Mother   . Hyperlipidemia Mother   . Hypertension Mother   . Heart disease Mother   . Obesity Mother   . Heart attack Paternal Grandfather   . Heart disease Sister   . BRCA 1/2 Neg Hx   . Breast cancer Neg Hx     Social History   Tobacco Use  . Smoking status: Never Smoker  . Smokeless tobacco: Never Used  Substance Use Topics  . Alcohol use: Yes    Alcohol/week: 7.0 standard drinks    Types: 7 Glasses of wine per week    Comment: 1 glass of wine/night    Subjective:  2 day history of headache- progressively worsening; feels like a "tight band" across the front of my head; "feels like a vice grip" around my head/ "throbbing on the right side of the head."  No previous history of migraine headaches; no nausea, vomiting; returned from vacation on Saturday with no symptoms; no previous history of head injury; rates pain as a 6-7/ 10;  Feels like heating pad on head/ hot shower beneficial; no vision changes; no sinus pain/ pressure/ no nasal congestion; Denies any chest pain, shortness of breath, blurred vision.   Objective:  Vitals:   10/14/17 1605  BP: 138/70  Pulse: (!) 51  Temp: 98.1 F (36.7 C)  TempSrc: Oral  SpO2: 94%  Weight: 149 lb 1.3 oz (67.6 kg)  Height: 5' (1.524 m)    General: Well developed, well nourished, in no acute distress  Skin : Warm and dry.  Head: Normocephalic and atraumatic  Eyes: Sclera and conjunctiva clear;  pupils round and reactive to light; extraocular movements intact  Ears: External normal; canals clear; tympanic membranes normal  Oropharynx: Pink, supple. No suspicious lesions  Neck: Supple without thyromegaly, adenopathy  Lungs: Respirations unlabored; clear to auscultation bilaterally without wheeze, rales, rhonchi  CVS exam: normal rate and regular rhythm.  Neurologic: Alert and oriented; speech intact; face symmetrical; moves all extremities well; CNII-XII intact without focal deficit   Assessment:  1. Nonintractable headache, unspecified chronicity pattern, unspecified headache type  Plan:  Symptoms are consistent with migraine but patient has no prior history of migraine headaches; unable to get any outpatient imaging done today- recommend ER evaluation to rule out other source of symptoms; she agrees- Camera operator at [employer]   No follow-ups on file.  No orders of the defined types were placed in this encounter.   Requested Prescriptions    No prescriptions requested or ordered in this encounter

## 2017-10-15 ENCOUNTER — Ambulatory Visit (INDEPENDENT_AMBULATORY_CARE_PROVIDER_SITE_OTHER): Payer: Medicare HMO | Admitting: Family Medicine

## 2017-10-15 ENCOUNTER — Other Ambulatory Visit: Payer: Self-pay

## 2017-10-15 ENCOUNTER — Encounter: Payer: Self-pay | Admitting: Neurology

## 2017-10-15 ENCOUNTER — Ambulatory Visit: Payer: Medicare HMO | Admitting: Neurology

## 2017-10-15 VITALS — BP 118/70 | HR 47 | Temp 97.5°F | Ht 60.0 in | Wt 146.0 lb

## 2017-10-15 VITALS — BP 131/59 | HR 59 | Resp 14 | Wt 146.0 lb

## 2017-10-15 DIAGNOSIS — G4485 Primary stabbing headache: Secondary | ICD-10-CM | POA: Diagnosis not present

## 2017-10-15 DIAGNOSIS — R51 Headache: Secondary | ICD-10-CM | POA: Diagnosis not present

## 2017-10-15 DIAGNOSIS — Z6830 Body mass index (BMI) 30.0-30.9, adult: Secondary | ICD-10-CM | POA: Diagnosis not present

## 2017-10-15 DIAGNOSIS — E669 Obesity, unspecified: Secondary | ICD-10-CM

## 2017-10-15 DIAGNOSIS — R519 Headache, unspecified: Secondary | ICD-10-CM | POA: Insufficient documentation

## 2017-10-15 DIAGNOSIS — R700 Elevated erythrocyte sedimentation rate: Secondary | ICD-10-CM

## 2017-10-15 DIAGNOSIS — E559 Vitamin D deficiency, unspecified: Secondary | ICD-10-CM

## 2017-10-15 DIAGNOSIS — E7849 Other hyperlipidemia: Secondary | ICD-10-CM

## 2017-10-15 DIAGNOSIS — J323 Chronic sphenoidal sinusitis: Secondary | ICD-10-CM | POA: Insufficient documentation

## 2017-10-15 DIAGNOSIS — E8881 Metabolic syndrome: Secondary | ICD-10-CM

## 2017-10-15 MED ORDER — AZITHROMYCIN 250 MG PO TABS: ORAL_TABLET | ORAL | 0 refills | Status: DC

## 2017-10-15 NOTE — Progress Notes (Signed)
Office: [phone]  /  Fax: [phone]   HPI:   Chief Complaint: OBESITY Courtney [name] is here to discuss her progress with her obesity treatment plan. She is on the Category 1 plan and is following her eating plan approximately 85 % of the time. She states she is walking 30 minutes 1 time per week. Courtney [name] was on vacation and had increased celebration eating and increased eating out. She is ready to get back on track now. Her weight is 146 lb (66.2 kg) today and she has not lost weight since her last visit. She has lost 11 lbs since starting treatment with [place].  Headache Courtney [name] was seen at the Emergency Department last night for a severe headache which is unusual for her. She was treated and her headache is better, but her sedimentation rate is elevated. There is a concern for temporal arteritis.  Hyperlipidemia Courtney [name] has hyperlipidemia and has been attempting to control her cholesterol levels with diet and exercise. She denies any chest pain, claudication or myalgias. She is due for labs today.  Vitamin D deficiency Courtney [name] has a diagnosis of vitamin D deficiency. She is currently taking vit D and denies nausea, vomiting or muscle weakness.  Insulin Resistance Courtney [name] has a diagnosis of insulin resistance based on her elevated fasting insulin level >5. Although March's blood glucose readings are still under good control, insulin resistance puts herat greater risk of metabolic syndrome and diabetes. She is not taking metformin currently and is controlling with her insulin resistance with diet and exercise to decrease the risk of diabetes. She is due for labs.   ALLERGIES: Allergies  Allergen Reactions  . Erythromycin Hives  . Penicillins Hives    Has patient had a PCN reaction causing immediate rash, facial/tongue/throat swelling, SOB or lightheadedness with hypotension: Yes Has patient had a PCN reaction causing severe rash involving mucus membranes or skin necrosis: No Has patient had a PCN  reaction that required hospitalization: No Has patient had a PCN reaction occurring within the last 10 years: No If all of the above answers are "NO", then may proceed with Cephalosporin use.   [name] Shellfish Allergy Hives    MEDICATIONS: Current Outpatient Medications on File Prior to Visit  Medication Sig Dispense Refill  . acetaminophen (TYLENOL) 500 MG tablet Take 1,000 mg by mouth every 6 (six) hours as needed (Pain).     . Alpha-D-Galactosidase (BEANO) TABS Take 1 tablet by mouth daily.    [name] aspirin 81 MG tablet Take 81 mg by mouth daily.    . diclofenac sodium (VOLTAREN) 1 % GEL Apply 2 g topically 4 (four) times daily. Rub into affected area of foot 2 to 4 times daily 100 g 2  . Docusate Calcium (STOOL SOFTENER PO) Take 1 tablet by mouth daily as needed for constipation.    [name] ezetimibe (ZETIA) 10 MG tablet TAKE 1 TABLET (10 MG TOTAL) BY MOUTH DAILY. 90 tablet 3  . metoprolol tartrate (LOPRESSOR) 25 MG tablet TAKE 1/2 TABLET TWICE DAILY 90 tablet 3  . MIRALAX powder Take 17 g by mouth daily as needed for constipation.    . nitroGLYCERIN (NITROSTAT) 0.4 MG SL tablet Place 1 tablet (0.4 mg total) under the tongue every 5 (five) minutes as needed for chest pain. 25 tablet 3  . omeprazole (PRILOSEC) 40 MG capsule Take 1 capsule (40 mg total) by mouth daily. 90 capsule 3  . ramipril (ALTACE) 5 MG capsule TAKE 1 CAPSULE EVERY DAY 90 capsule 3  . rosuvastatin (  CRESTOR) 40 MG tablet TAKE 1 TABLET EVERY DAY 90 tablet 2  . Vitamin D, Ergocalciferol, (DRISDOL) 50000 units CAPS capsule Take 1 capsule (50,000 Units total) by mouth every 7 (seven) days. 4 capsule 0   No current facility-administered medications on file prior to visit.     PAST MEDICAL HISTORY: Past Medical History:  Diagnosis Date  . Aortic valve sclerosis    echo 32/4401, soft systolic murmur  . 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 Back pain   . Carotid artery disease ([place])    40-59% bilateral, doppler December 2010  .  Constipation   . Coronary artery disease    a. BMS-mid RCA 11/2007 b. stress echo 02/2009 subtle inferior HK, lateral ST depressions with stress c. follow-up cath 02/2009: RCA stent patent, severe but stable stenosis of distal posterior lateral branch RCA. LV normal, med tx unless more sx c. ETT Myoview 08/27/12: negative for ischemia; EF 64%  . Decreased hearing   . Depression    Mild, August, 2012  . Dyslipidemia   . Easy bruising   . Ejection fraction    EF normal, catheterization, 2011 /  EF 55%, echo, 2010  . Fluid overload    Mild, stable since hospitalization in the past  . GERD (gastroesophageal reflux disease)    occarionally, takes Pepcid over the counter  . Heart murmur    has, occasional PVC's  . History of right hip replacement   . Hyperlipidemia   . Hypertension   . Joint pain   . Myocardial infarction ([place])    2009  . Palpitations   . PVC (premature ventricular contraction)    per prior Holter monitor  . Right hip pain 12/2009  . Shellfish allergy   . Trouble in sleeping   . Vitamin B12 deficiency   . Vitamin D deficiency     PAST SURGICAL HISTORY: Past Surgical History:  Procedure Laterality Date  . 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/01/2005  . carcinoid tumor removal    . CARDIAC CATHETERIZATION  1/211   Patent RCA stent, stenotic PLB supplying small distribution; medically managed  . CORONARY ANGIOPLASTY WITH STENT PLACEMENT  11/2007   BMS-mid RCA  . DIAGNOSTIC LAPAROSCOPY     1982 for endometriosis  . DILATION AND CURETTAGE OF UTERUS  2005  . hx of wisdom teeth surgery    . SKIN CANCER EXCISION    . TOTAL HIP ARTHROPLASTY Right 07/08/2014   Procedure: RIGHT TOTAL HIP ARTHROPLASTY ANTERIOR APPROACH;  Surgeon: Mcarthur Rossetti, MD;  Location: [facility];  Service: Orthopedics;  Laterality: Right;    SOCIAL HISTORY: Social History   Tobacco Use  . Smoking status: Never Smoker  . Smokeless tobacco: Never Used  Substance Use Topics  . Alcohol use: Yes     Alcohol/week: 7.0 standard drinks    Types: 7 Glasses of wine per week    Comment: 1 glass of wine/night  . Drug use: No    FAMILY HISTORY: Family History  Problem Relation Age of Onset  . Heart attack Father 56  . Hyperlipidemia Father   . Hypertension Father   . Sudden death Father   . Stroke Mother   . Hyperlipidemia Mother   . Hypertension Mother   . Heart disease Mother   . Obesity Mother   . Heart attack Paternal Grandfather   . Heart disease Sister   . BRCA 1/2 Neg Hx   . Breast cancer Neg Hx     ROS: Review  of Systems  Constitutional: Negative for weight loss.  Cardiovascular: Negative for chest pain and claudication.  Gastrointestinal: Negative for nausea and vomiting.  Musculoskeletal: Negative for myalgias.       Negative for muscle weakness.  Neurological: Positive for headaches.  Endo/Heme/Allergies:       Negative for hypoglycemia.    PHYSICAL EXAM: Blood pressure 118/70, pulse (!) 47, temperature (!) 97.5 F (36.4 C), temperature source Oral, height 5' (1.524 m), weight 146 lb (66.2 kg), SpO2 97 %. Body mass index is 28.51 kg/m. Physical Exam  Constitutional: She is oriented to person, place, and time. She appears well-developed and well-nourished.  Cardiovascular: Normal rate.  Pulmonary/Chest: Effort normal.  Musculoskeletal: Normal range of motion.  Neurological: She is oriented to person, place, and time.  Skin: Skin is warm and dry.  Psychiatric: She has a normal mood and affect. Her behavior is normal.  Vitals reviewed.   RECENT LABS AND TESTS: BMET    Component Value Date/Time   NA 143 10/14/2017 1923   NA 141 06/16/2017 0000   K 4.1 10/14/2017 1923   CL 105 10/14/2017 1923   CO2 27 10/14/2017 1923   GLUCOSE 92 10/14/2017 1923   BUN 26 (H) 10/14/2017 1923   BUN 22 06/16/2017 0000   CREATININE 0.98 10/14/2017 1923   CALCIUM 9.7 10/14/2017 1923   GFRNONAA 58 (L) 10/14/2017 1923   GFRAA >60 10/14/2017 1923   Lab Results    Component Value Date   HGBA1C 5.6 06/16/2017   Lab Results  Component Value Date   INSULIN 6.4 06/16/2017   CBC    Component Value Date/Time   WBC 12.2 (H) 10/14/2017 1923   RBC 4.21 10/14/2017 1923   HGB 13.4 10/14/2017 1923   HCT 39.7 10/14/2017 1923   PLT 235 10/14/2017 1923   MCV 94.3 10/14/2017 1923   MCH 31.8 10/14/2017 1923   MCHC 33.8 10/14/2017 1923   RDW 12.9 10/14/2017 1923   LYMPHSABS 2.9 10/14/2017 1923   MONOABS 0.8 10/14/2017 1923   EOSABS 0.2 10/14/2017 1923   BASOSABS 0.0 10/14/2017 1923   Iron/TIBC/Ferritin/ %Sat    Component Value Date/Time   IRON 62 01/09/2015 0806   FERRITIN 26.6 01/09/2015 0806   IRONPCTSAT 15.2 (L) 01/09/2015 0806   Lipid Panel     Component Value Date/Time   CHOL 161 06/16/2017 0000   TRIG 128 06/16/2017 0000   HDL 61 06/16/2017 0000   CHOLHDL 2.5 10/29/2016 1030   CHOLHDL 3 11/30/2015 1028   VLDL 25.4 11/30/2015 1028   LDLCALC 74 06/16/2017 0000   Hepatic Function Panel     Component Value Date/Time   PROT 7.1 06/16/2017 0000   ALBUMIN 4.7 06/16/2017 0000   AST 27 06/16/2017 0000   ALT 20 06/16/2017 0000   ALKPHOS 88 06/16/2017 0000   BILITOT 0.3 06/16/2017 0000   BILIDIR 0.07 10/29/2016 1030      Component Value Date/Time   TSH 0.931 06/16/2017 0000   TSH 1.43 01/09/2015 0806   TSH 0.876 08/26/2012 1722   Results for IVORI, STORR (MRN [id number]) as of 10/15/2017 14:26  Ref. Range 06/16/2017 00:00  Vitamin D, 25-Hydroxy Latest Ref Range: 30.0 - 100.0 ng/mL 37.1    ASSESSMENT AND PLAN: Primary stabbing headache - Plan: Ambulatory referral to Neurology  Other hyperlipidemia - Plan: Lipid Panel With LDL/HDL Ratio  Vitamin D deficiency - Plan: VITAMIN D 25 Hydroxy (Vit-D Deficiency, Fractures)  Insulin resistance - Plan: Hemoglobin A1c, Insulin, random  Class  1 obesity with serious comorbidity and body mass index (BMI) of 30.0 to 30.9 in adult, unspecified obesity type - Starting BMI greater then  30  PLAN:  Headache We will refer to her to Dr. Jaynee Eagles or [facility].  Hyperlipidemia Courtney [name] was informed of the American Heart Association Guidelines emphasizing intensive lifestyle modifications as the first line treatment for hyperlipidemia. We discussed many lifestyle modifications today in depth, and Courtney [name] will continue to work on decreasing saturated fats such as fatty red meat, butter and many fried foods. She will continue her diet and exercise and we will check labs today.  Vitamin D Deficiency Courtney [name] was informed that low vitamin D levels contributes to fatigue and are associated with obesity, breast, and colon cancer. She agrees to continue to take prescription Vit D '@50'$ ,000 IU every week and will follow up for routine testing of vitamin D, at least 2-3 times per year. She was informed of the risk of over-replacement of vitamin D and agrees to not increase her dose unless she discusses this with [place] first. We will check on labs and follow up at the next visit.  Insulin Resistance Courtney [name] will continue to work on weight loss, exercise, and decreasing simple carbohydrates in her diet to help decrease the risk of diabetes.  She was informed that eating too many simple carbohydrates or too many calories at one sitting increases the likelihood of GI side effects. She will continue her diet and we check labs today. Courtney [name] agreed to follow up with [place] as directed to monitor her progress.  Obesity Courtney [name] is currently in the action stage of change. As such, her goal is to continue with weight loss efforts. She has agreed to follow the Category 1 plan. Courtney [name] has been instructed to work up to a goal of 150 minutes of combined cardio and strengthening exercise per week for weight loss and overall health benefits. We discussed the following Behavioral Modification Stratagies today: increasing lean protein intake and decreasing simple carbohydrates .  Courtney [name] has agreed to follow up  with our clinic in 2 to 3 weeks. She was informed of the importance of frequent follow up visits to maximize her success with intensive lifestyle modifications for her multiple health conditions.   OBESITY BEHAVIORAL INTERVENTION VISIT  Today's visit was # 7 Starting weight: 157 lbs Starting date: 06/16/17 Today's weight :  146 lb (66.2 kg)  Today's date: 10/15/2017 Total lbs lost to date: 11 At least 15 minutes were spent on discussing the following behavioral intervention visit.   ASK: We discussed the diagnosis of obesity with Courtney [name] today and Sharron agreed to give [place] permission to discuss obesity behavioral modification therapy today.  ASSESS: Courtney [name] has the diagnosis of obesity and her BMI today is 28.51. Courtney [name] is in the action stage of change   ADVISE: Courtney [name] was educated on the multiple health risks of obesity as well as the benefit of weight loss to improve her health. She was advised of the need for long term treatment and the importance of lifestyle modifications to improve her current health and to decrease her risk of future health problems.  AGREE: Multiple dietary modification options and treatment options were discussed and  Courtney [name] agreed to follow the recommendations documented in the above note.  ARRANGE: Courtney [name] was educated on the importance of frequent visits to treat obesity as outlined per CMS and USPSTF guidelines and agreed to schedule her next follow up appointment today.  IMarcille Blanco, am acting  as transcriptionist for Starlyn Skeans, MD  I have reviewed the above documentation for accuracy and completeness, and I agree with the above. -Dennard Nip, MD

## 2017-10-15 NOTE — Progress Notes (Signed)
[facility]  PATIENT: Courtney [name] DOB: 1950/04/20  REFERRING DOCTOR OR PCP:  [name], NP SOURCE: Patient, notes from emergency room and PCP, operatory results, imaging reports, CT scan from 10/14/2017 personally reviewed.  _________________________________   HISTORICAL  CHIEF COMPLAINT:  Chief Complaint  Patient presents with  . Headache    [name] is here for eval of h/a "like a band around my head."  Onset Monday.  Seen by her pcp and sent to the ER Tuesday, where h/a resolved with IV Toradol, Benadryl and Zofran. ESR was elevated at 50, so she was refererd here for r/o temporal arteritis/fim    HISTORY OF PRESENT ILLNESS:  I had the pleasure seeing patient, Courtney [name], at [facility] for neurologic consultation regarding her headaches and elevated ESR.  She is a 67 year old woman who had the onset of a headache 2 days ago.  The HA began in both temples and spread to become a band around her entire head.   The right side was slightly more painful.   She had no nausea or vomiting.  She had mild phoptphobia when the headache intensified.  Pain worsened when she bent her head backwards bu tno change with other movements.    She had an e-visit and then was told to see her PCP (Cohoe) was seen by her primary care physician and then sent to the emergency room.  In the emergency room she received IV Toradol, Benadryl and Zofran.  Headache improved.   She had a CT scan of the head read as normal.   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  Incidentally, she has frontal sinu hypoplasia ont eh right and an absent left frontal sinus.    No jaw claudication.   No visual changes.    She rarely gets a headache and never a severe headache.   She has had sinusitis in the past.       She has 40-59% bilateral carotid stenosis.    Additionally, she has essential hypertension and  hyperlipidemia.   REVIEW OF SYSTEMS: Constitutional: No fevers, chills, sweats, or change in appetite Eyes: No visual changes, double vision, eye pain Ear, nose and throat: No hearing loss, ear pain, nasal congestion, sore throat.   She has history of sinusitis intermittently. Cardiovascular: No chest pain, palpitations Respiratory: No shortness of breath at rest or with exertion.   No wheezes GastrointestinaI: No nausea, vomiting, diarrhea, abdominal pain, fecal incontinence.  She has constipation. Genitourinary: No dysuria, urinary retention or frequency.  No nocturia. Musculoskeletal: No neck pain.  she has some back pain and hip pain.   Integumentary: No rash, pruritus, skin lesions Neurological: as above Psychiatric: No depression at this time.  No anxiety Endocrine: No palpitations, diaphoresis, change in appetite, change in weigh or increased thirst Hematologic/Lymphatic: No anemia, purpura, petechiae. Allergic/Immunologic: No itchy/runny eyes, nasal congestion, recent allergic reactions, rashes  ALLERGIES: Allergies  Allergen Reactions  . Erythromycin Hives  . Penicillins Hives    Has patient had a PCN reaction causing immediate rash, facial/tongue/throat swelling, SOB or lightheadedness with hypotension: Yes Has patient had a PCN reaction causing severe rash involving mucus membranes or skin necrosis: No Has patient had a PCN reaction that required hospitalization: No Has patient had a PCN reaction occurring within the last 10 years: No If all of the above answers are "NO", then may proceed with Cephalosporin use.   . Shellfish Allergy Hives  HOME MEDICATIONS:  Current Outpatient Medications:  .  acetaminophen (TYLENOL) 500 MG tablet, Take 1,000 mg by mouth every 6 (six) hours as needed (Pain). , Disp: , Rfl:  .  Alpha-D-Galactosidase (BEANO) TABS, Take 1 tablet by mouth daily., Disp: , Rfl:  .  aspirin 81 MG tablet, Take 81 mg by mouth daily., Disp: , Rfl:  .   diclofenac sodium (VOLTAREN) 1 % GEL, Apply 2 g topically 4 (four) times daily. Rub into affected area of foot 2 to 4 times daily, Disp: 100 g, Rfl: 2 .  Docusate Calcium (STOOL SOFTENER PO), Take 1 tablet by mouth daily as needed for constipation., Disp: , Rfl:  .  ezetimibe (ZETIA) 10 MG tablet, TAKE 1 TABLET (10 MG TOTAL) BY MOUTH DAILY., Disp: 90 tablet, Rfl: 3 .  metoprolol tartrate (LOPRESSOR) 25 MG tablet, TAKE 1/2 TABLET TWICE DAILY, Disp: 90 tablet, Rfl: 3 .  MIRALAX powder, Take 17 g by mouth daily as needed for constipation., Disp: , Rfl:  .  nitroGLYCERIN (NITROSTAT) 0.4 MG SL tablet, Place 1 tablet (0.4 mg total) under the tongue every 5 (five) minutes as needed for chest pain., Disp: 25 tablet, Rfl: 3 .  omeprazole (PRILOSEC) 40 MG capsule, Take 1 capsule (40 mg total) by mouth daily., Disp: 90 capsule, Rfl: 3 .  ramipril (ALTACE) 5 MG capsule, TAKE 1 CAPSULE EVERY DAY, Disp: 90 capsule, Rfl: 3 .  rosuvastatin (CRESTOR) 40 MG tablet, TAKE 1 TABLET EVERY DAY, Disp: 90 tablet, Rfl: 2 .  Vitamin D, Ergocalciferol, (DRISDOL) 50000 units CAPS capsule, Take 1 capsule (50,000 Units total) by mouth every 7 (seven) days., Disp: 4 capsule, Rfl: 0  PAST MEDICAL HISTORY: Past Medical History:  Diagnosis Date  . Aortic valve sclerosis    echo 93/5701, soft systolic murmur  . 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 Back pain   . Carotid artery disease ([place])    40-59% bilateral, doppler December 2010  . Constipation   . Coronary artery disease    a. BMS-mid RCA 11/2007 b. stress echo 02/2009 subtle inferior HK, lateral ST depressions with stress c. follow-up cath 02/2009: RCA stent patent, severe but stable stenosis of distal posterior lateral branch RCA. LV normal, med tx unless more sx c. ETT Myoview 08/27/12: negative for ischemia; EF 64%  . Decreased hearing   . Depression    Mild, August, 2012  . Dyslipidemia   . Easy bruising   . Ejection fraction    EF normal, catheterization, 2011 /  EF 55%,  echo, 2010  . Fluid overload    Mild, stable since hospitalization in the past  . GERD (gastroesophageal reflux disease)    occarionally, takes Pepcid over the counter  . Heart murmur    has, occasional PVC's  . History of right hip replacement   . Hyperlipidemia   . Hypertension   . Joint pain   . Myocardial infarction ([place])    2009  . Palpitations   . PVC (premature ventricular contraction)    per prior Holter monitor  . Right hip pain 12/2009  . Shellfish allergy   . Trouble in sleeping   . Vitamin B12 deficiency   . Vitamin D deficiency     PAST SURGICAL HISTORY: Past Surgical History:  Procedure Laterality Date  . 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/01/2005  . carcinoid tumor removal    . CARDIAC CATHETERIZATION  1/211   Patent RCA stent, stenotic PLB supplying small distribution; medically managed  .  CORONARY ANGIOPLASTY WITH STENT PLACEMENT  11/2007   BMS-mid RCA  . DIAGNOSTIC LAPAROSCOPY     1982 for endometriosis  . DILATION AND CURETTAGE OF UTERUS  2005  . hx of wisdom teeth surgery    . SKIN CANCER EXCISION    . TOTAL HIP ARTHROPLASTY Right 07/08/2014   Procedure: RIGHT TOTAL HIP ARTHROPLASTY ANTERIOR APPROACH;  Surgeon: Mcarthur Rossetti, MD;  Location: [facility];  Service: Orthopedics;  Laterality: Right;    FAMILY HISTORY: Family History  Problem Relation Age of Onset  . Heart attack Father 31  . Hyperlipidemia Father   . Hypertension Father   . Sudden death Father   . Stroke Mother   . Hyperlipidemia Mother   . Hypertension Mother   . Heart disease Mother   . Obesity Mother   . Heart attack Paternal Grandfather   . Heart disease Sister   . BRCA 1/2 Neg Hx   . Breast cancer Neg Hx     SOCIAL HISTORY:  Social History   Socioeconomic History  . Marital status: Divorced    Spouse name: Not on file  . Number of children: 1  . Years of education: 60  . Highest education level: Not on file  Occupational History  . Occupation: Retired - Retail buyer   Social Needs  . Financial resource strain: Not on file  . Food insecurity:    Worry: Not on file    Inability: Not on file  . Transportation needs:    Medical: Not on file    Non-medical: Not on file  Tobacco Use  . Smoking status: Never Smoker  . Smokeless tobacco: Never Used  Substance and Sexual Activity  . Alcohol use: Yes    Alcohol/week: 7.0 standard drinks    Types: 7 Glasses of wine per week    Comment: 1 glass of wine/night  . Drug use: No  . Sexual activity: Yes  Lifestyle  . Physical activity:    Days per week: Not on file    Minutes per session: Not on file  . Stress: Not on file  Relationships  . Social connections:    Talks on phone: Not on file    Gets together: Not on file    Attends religious service: Not on file    Active member of club or organization: Not on file    Attends meetings of clubs or organizations: Not on file    Relationship status: Not on file  . Intimate partner violence:    Fear of current or ex partner: Not on file    Emotionally abused: Not on file    Physically abused: Not on file    Forced sexual activity: Not on file  Other Topics Concern  . Not on file  Social History Narrative   Fun: Dance, garden,    Denies religious beliefs effecting health care.   Denies abuse and feels safe at home     PHYSICAL EXAM  Vitals:   10/15/17 1504  BP: (!) 131/59  Pulse: (!) 59  Resp: 14  Weight: 146 lb (66.2 kg)    Body mass index is 28.51 kg/m.   General: The patient is well-developed and well-nourished and in no acute distress  Eyes:  Funduscopic exam shows normal optic discs and retinal vessels.  Neck: The neck is supple.  Mild carotid bruits are noted.  The neck is nontender with good ROM  Cardiovascular: The heart has a regular rate and rhythm with a normal S1  and S2. There were no murmurs, gallops or rubs. Lungs are clear to auscultation.  Skin: Extremities are without significant edema.   Neurologic Exam  Mental  status: The patient is alert and oriented x 3 at the time of the examination. The patient has apparent normal recent and remote memory, with an apparently normal attention span and concentration ability.   Speech is normal.  Cranial nerves: Extraocular movements are full. Pupils are equal, round, and reactive to light and accomodation.  Visual fields are full.  Facial symmetry is present. There is good facial sensation to soft touch bilaterally.Facial strength is normal.  Trapezius and sternocleidomastoid strength is normal. No dysarthria is noted.  The tongue is midline, and the patient has symmetric elevation of the soft palate. No obvious hearing deficits are noted.  Motor:  Muscle bulk is normal.   Tone is normal. Strength is  5 / 5 in all 4 extremities.   Sensory: Sensory testing is intact to pinprick, soft touch and vibration sensation in all 4 extremities.  Coordination: Cerebellar testing reveals good finger-nose-finger and heel-to-shin bilaterally.  Gait and station: Station is normal.   Gait is normal. Tandem gait is normal. Romberg is negative.   Reflexes: Deep tendon reflexes are symmetric and normal bilaterally.   Plantar responses are flexor.    DIAGNOSTIC DATA (LABS, IMAGING, TESTING) - I reviewed patient records, labs, notes, testing and imaging myself where available.  Lab Results  Component Value Date   WBC 12.2 (H) 10/14/2017   HGB 13.4 10/14/2017   HCT 39.7 10/14/2017   MCV 94.3 10/14/2017   PLT 235 10/14/2017      Component Value Date/Time   NA 143 10/14/2017 1923   NA 141 06/16/2017 0000   K 4.1 10/14/2017 1923   CL 105 10/14/2017 1923   CO2 27 10/14/2017 1923   GLUCOSE 92 10/14/2017 1923   BUN 26 (H) 10/14/2017 1923   BUN 22 06/16/2017 0000   CREATININE 0.98 10/14/2017 1923   CALCIUM 9.7 10/14/2017 1923   PROT 7.1 06/16/2017 0000   ALBUMIN 4.7 06/16/2017 0000   AST 27 06/16/2017 0000   ALT 20 06/16/2017 0000   ALKPHOS 88 06/16/2017 0000   BILITOT 0.3  06/16/2017 0000   GFRNONAA 58 (L) 10/14/2017 1923   GFRAA >60 10/14/2017 1923   Lab Results  Component Value Date   CHOL 161 06/16/2017   HDL 61 06/16/2017   LDLCALC 74 06/16/2017   TRIG 128 06/16/2017   CHOLHDL 2.5 10/29/2016   Lab Results  Component Value Date   HGBA1C 5.6 06/16/2017   Lab Results  Component Value Date   VITAMINB12 332 06/16/2017   Lab Results  Component Value Date   TSH 0.931 06/16/2017       ASSESSMENT AND PLAN  Nonintractable headache, unspecified chronicity pattern, unspecified headache type - Plan: Sedimentation rate, C-reactive protein  Elevated sed rate - Plan: Sedimentation rate, C-reactive protein  Sphenoid sinusitis, unspecified chronicity   In summary, Mrs. Janusz is a 67 year old woman who had a severe headache 10/13/2017 into 10/14/2017.  Currently, she feels she is back at baseline and denies any head or neck pain.  In the emergency room, she had lab work showing a mildly elevated sed rate equals 50.   Although the CT scan was read as normal.  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  Sphenoid sinusitis is capable of causing significant headaches though I am uncertain that there is a connection.  I discussed with  her that her ESR was just slightly elevated at 50 but I need to recheck the ESR and we will also check a CRP.  If both of these are elevated and she does have recurrence of severe headache we would need to consider referral for a temporal artery biopsy.  Because of the sinusitis and the possibility that that contributed to her headache I will have her take a Z-Pak.  She will give [place] a call if the headache returns or she has any new or worsening neurologic symptoms.  We will let her know the results of the lab work.  Thank you for asking me to see Mrs. Mcmahill.  Please let me know if I can be of further assistance with her or other patients in the future.   Richard A. Felecia Shelling, MD, [facility] [id number]/[id number], 4:92 PM Certified in  Neurology, Clinical Neurophysiology, Sleep Medicine, Pain Medicine and Neuroimaging  [facility] [address] [place], [place] [address] [phone]

## 2017-10-16 ENCOUNTER — Telehealth: Payer: Self-pay | Admitting: *Deleted

## 2017-10-16 LAB — SEDIMENTATION RATE: SED RATE: 26 mm/h (ref 0–40)

## 2017-10-16 LAB — HEMOGLOBIN A1C
ESTIMATED AVERAGE GLUCOSE: 114 mg/dL
HEMOGLOBIN A1C: 5.6 % (ref 4.8–5.6)

## 2017-10-16 LAB — LIPID PANEL WITH LDL/HDL RATIO
CHOLESTEROL TOTAL: 139 mg/dL (ref 100–199)
HDL: 50 mg/dL (ref >39)
LDL Calculated: 66 mg/dL (ref 0–99)
LDl/HDL Ratio: 1.3 ratio (ref 0.0–3.2)
Triglycerides: 114 mg/dL (ref 0–149)
VLDL CHOLESTEROL CAL: 23 mg/dL (ref 5–40)

## 2017-10-16 LAB — C-REACTIVE PROTEIN: CRP: 32 mg/L — AB (ref 0–10)

## 2017-10-16 LAB — VITAMIN D 25 HYDROXY (VIT D DEFICIENCY, FRACTURES): Vit D, 25-Hydroxy: 57.7 ng/mL (ref 30.0–100.0)

## 2017-10-16 LAB — INSULIN, RANDOM: INSULIN: 5.9 u[IU]/mL (ref 2.6–24.9)

## 2017-10-16 NOTE — Telephone Encounter (Signed)
LMOM with below lab results and that decreasing esr means it is not likely that she has temporal arteritis; please call with any questions/fim

## 2017-10-16 NOTE — Telephone Encounter (Signed)
-----   Message from Britt Bottom, MD sent at 10/16/2017  1:16 PM EDT ----- Please let the patient know that the sed rate is better today so very unlikely to be temporal arteritis

## 2017-10-29 ENCOUNTER — Ambulatory Visit (INDEPENDENT_AMBULATORY_CARE_PROVIDER_SITE_OTHER): Payer: Self-pay | Admitting: Physician Assistant

## 2017-10-29 ENCOUNTER — Encounter (INDEPENDENT_AMBULATORY_CARE_PROVIDER_SITE_OTHER): Payer: Self-pay

## 2017-10-30 ENCOUNTER — Ambulatory Visit (INDEPENDENT_AMBULATORY_CARE_PROVIDER_SITE_OTHER): Payer: Medicare HMO | Admitting: Family Medicine

## 2017-10-30 VITALS — BP 108/64 | HR 42 | Temp 97.9°F | Ht 60.0 in | Wt 146.0 lb

## 2017-10-30 DIAGNOSIS — Z6830 Body mass index (BMI) 30.0-30.9, adult: Secondary | ICD-10-CM | POA: Diagnosis not present

## 2017-10-30 DIAGNOSIS — E559 Vitamin D deficiency, unspecified: Secondary | ICD-10-CM | POA: Diagnosis not present

## 2017-10-30 DIAGNOSIS — E669 Obesity, unspecified: Secondary | ICD-10-CM

## 2017-10-30 MED ORDER — VITAMIN D (ERGOCALCIFEROL) 1.25 MG (50000 UNIT) PO CAPS: 50000.0000 [IU] | ORAL_CAPSULE | ORAL | 0 refills | Status: DC

## 2017-11-03 NOTE — Progress Notes (Signed)
Office: [phone]  /  Fax: [phone]   HPI:   Chief Complaint: OBESITY Courtney [name] is here to discuss her progress with her obesity treatment plan. She is on the Category 1 plan and is following her eating plan approximately 90 % of the time. She states she is doing yard work for 90 minutes 2 times per week. Courtney [name] continues to do well following her Category 1 plan. She is retaining some fluid today. She is working on decreasing snacks and meal prepping. She is going camping and has questions about how to manage healthy eating.  Her weight is 146 lb (66.2 kg) today and has not lost weight since her last visit. She has lost 11 lbs since starting treatment with [place].  Vitamin D Deficiency Courtney [name] has a diagnosis of vitamin D deficiency. She is stable on Vit D, level is now at goal. She denies nausea, vomiting or muscle weakness.  ALLERGIES: Allergies  Allergen Reactions  . Erythromycin Hives  . Penicillins Hives    Has patient had a PCN reaction causing immediate rash, facial/tongue/throat swelling, SOB or lightheadedness with hypotension: Yes Has patient had a PCN reaction causing severe rash involving mucus membranes or skin necrosis: No Has patient had a PCN reaction that required hospitalization: No Has patient had a PCN reaction occurring within the last 10 years: No If all of the above answers are "NO", then may proceed with Cephalosporin use.   [name] Shellfish Allergy Hives    MEDICATIONS: Current Outpatient Medications on File Prior to Visit  Medication Sig Dispense Refill  . acetaminophen (TYLENOL) 500 MG tablet Take 1,000 mg by mouth every 6 (six) hours as needed (Pain).     . Alpha-D-Galactosidase (BEANO) TABS Take 1 tablet by mouth daily.    [name] aspirin 81 MG tablet Take 81 mg by mouth daily.    . diclofenac sodium (VOLTAREN) 1 % GEL Apply 2 g topically 4 (four) times daily. Rub into affected area of foot 2 to 4 times daily 100 g 2  . Docusate Calcium (STOOL SOFTENER PO) Take 1  tablet by mouth daily as needed for constipation.    [name] ezetimibe (ZETIA) 10 MG tablet TAKE 1 TABLET (10 MG TOTAL) BY MOUTH DAILY. 90 tablet 3  . metoprolol tartrate (LOPRESSOR) 25 MG tablet TAKE 1/2 TABLET TWICE DAILY 90 tablet 3  . MIRALAX powder Take 17 g by mouth daily as needed for constipation.    . nitroGLYCERIN (NITROSTAT) 0.4 MG SL tablet Place 1 tablet (0.4 mg total) under the tongue every 5 (five) minutes as needed for chest pain. 25 tablet 3  . omeprazole (PRILOSEC) 40 MG capsule Take 1 capsule (40 mg total) by mouth daily. 90 capsule 3  . ramipril (ALTACE) 5 MG capsule TAKE 1 CAPSULE EVERY DAY 90 capsule 3  . rosuvastatin (CRESTOR) 40 MG tablet TAKE 1 TABLET EVERY DAY 90 tablet 2   No current facility-administered medications on file prior to visit.     PAST MEDICAL HISTORY: Past Medical History:  Diagnosis Date  . Aortic valve sclerosis    echo 30/0762, soft systolic murmur  . 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 Back pain   . Carotid artery disease ([place])    40-59% bilateral, doppler December 2010  . Constipation   . Coronary artery disease    a. BMS-mid RCA 11/2007 b. stress echo 02/2009 subtle inferior HK, lateral ST depressions with stress c. follow-up cath 02/2009: RCA stent patent, severe but stable stenosis of  distal posterior lateral branch RCA. LV normal, med tx unless more sx c. ETT Myoview 08/27/12: negative for ischemia; EF 64%  . Decreased hearing   . Depression    Mild, August, 2012  . Dyslipidemia   . Easy bruising   . Ejection fraction    EF normal, catheterization, 2011 /  EF 55%, echo, 2010  . Fluid overload    Mild, stable since hospitalization in the past  . GERD (gastroesophageal reflux disease)    occarionally, takes Pepcid over the counter  . Heart murmur    has, occasional PVC's  . History of right hip replacement   . Hyperlipidemia   . Hypertension   . Joint pain   . Myocardial infarction ([place])    2009  . Palpitations   . PVC (premature  ventricular contraction)    per prior Holter monitor  . Right hip pain 12/2009  . Shellfish allergy   . Trouble in sleeping   . Vitamin B12 deficiency   . Vitamin D deficiency     PAST SURGICAL HISTORY: Past Surgical History:  Procedure Laterality Date  . 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/01/2005  . carcinoid tumor removal    . CARDIAC CATHETERIZATION  1/211   Patent RCA stent, stenotic PLB supplying small distribution; medically managed  . CORONARY ANGIOPLASTY WITH STENT PLACEMENT  11/2007   BMS-mid RCA  . DIAGNOSTIC LAPAROSCOPY     1982 for endometriosis  . DILATION AND CURETTAGE OF UTERUS  2005  . hx of wisdom teeth surgery    . SKIN CANCER EXCISION    . TOTAL HIP ARTHROPLASTY Right 07/08/2014   Procedure: RIGHT TOTAL HIP ARTHROPLASTY ANTERIOR APPROACH;  Surgeon: Mcarthur Rossetti, MD;  Location: [facility];  Service: Orthopedics;  Laterality: Right;    SOCIAL HISTORY: Social History   Tobacco Use  . Smoking status: Never Smoker  . Smokeless tobacco: Never Used  Substance Use Topics  . Alcohol use: Yes    Alcohol/week: 7.0 standard drinks    Types: 7 Glasses of wine per week    Comment: 1 glass of wine/night  . Drug use: No    FAMILY HISTORY: Family History  Problem Relation Age of Onset  . Heart attack Father 58  . Hyperlipidemia Father   . Hypertension Father   . Sudden death Father   . Stroke Mother   . Hyperlipidemia Mother   . Hypertension Mother   . Heart disease Mother   . Obesity Mother   . Heart attack Paternal Grandfather   . Heart disease Sister   . BRCA 1/2 Neg Hx   . Breast cancer Neg Hx     ROS: Review of Systems  Constitutional: Negative for weight loss.  Gastrointestinal: Negative for nausea and vomiting.  Musculoskeletal:       Negative muscle weakness    PHYSICAL EXAM: Blood pressure 108/64, pulse (!) 42, temperature 97.9 F (36.6 C), temperature source Oral, height 5' (1.524 m), weight 146 lb (66.2 kg), SpO2 100 %. Body mass index is  28.51 kg/m. Physical Exam  Constitutional: She is oriented to person, place, and time. She appears well-developed and well-nourished.  Cardiovascular: Normal rate.  Pulmonary/Chest: Effort normal.  Musculoskeletal: Normal range of motion.  Neurological: She is oriented to person, place, and time.  Skin: Skin is warm and dry.  Psychiatric: She has a normal mood and affect. Her behavior is normal.  Vitals reviewed.   RECENT LABS AND TESTS: BMET    Component Value Date/Time  NA 143 10/14/2017 1923   NA 141 06/16/2017 0000   K 4.1 10/14/2017 1923   CL 105 10/14/2017 1923   CO2 27 10/14/2017 1923   GLUCOSE 92 10/14/2017 1923   BUN 26 (H) 10/14/2017 1923   BUN 22 06/16/2017 0000   CREATININE 0.98 10/14/2017 1923   CALCIUM 9.7 10/14/2017 1923   GFRNONAA 58 (L) 10/14/2017 1923   GFRAA >60 10/14/2017 1923   Lab Results  Component Value Date   HGBA1C 5.6 10/15/2017   HGBA1C 5.6 06/16/2017   Lab Results  Component Value Date   INSULIN 5.9 10/15/2017   INSULIN 6.4 06/16/2017   CBC    Component Value Date/Time   WBC 12.2 (H) 10/14/2017 1923   RBC 4.21 10/14/2017 1923   HGB 13.4 10/14/2017 1923   HCT 39.7 10/14/2017 1923   PLT 235 10/14/2017 1923   MCV 94.3 10/14/2017 1923   MCH 31.8 10/14/2017 1923   MCHC 33.8 10/14/2017 1923   RDW 12.9 10/14/2017 1923   LYMPHSABS 2.9 10/14/2017 1923   MONOABS 0.8 10/14/2017 1923   EOSABS 0.2 10/14/2017 1923   BASOSABS 0.0 10/14/2017 1923   Iron/TIBC/Ferritin/ %Sat    Component Value Date/Time   IRON 62 01/09/2015 0806   FERRITIN 26.6 01/09/2015 0806   IRONPCTSAT 15.2 (L) 01/09/2015 0806   Lipid Panel     Component Value Date/Time   CHOL 139 10/15/2017 0802   TRIG 114 10/15/2017 0802   HDL 50 10/15/2017 0802   CHOLHDL 2.5 10/29/2016 1030   CHOLHDL 3 11/30/2015 1028   VLDL 25.4 11/30/2015 1028   LDLCALC 66 10/15/2017 0802   Hepatic Function Panel     Component Value Date/Time   PROT 7.1 06/16/2017 0000   ALBUMIN 4.7  06/16/2017 0000   AST 27 06/16/2017 0000   ALT 20 06/16/2017 0000   ALKPHOS 88 06/16/2017 0000   BILITOT 0.3 06/16/2017 0000   BILIDIR 0.07 10/29/2016 1030      Component Value Date/Time   TSH 0.931 06/16/2017 0000   TSH 1.43 01/09/2015 0806   TSH 0.876 08/26/2012 1722  Results for LEVETA, WAHAB (MRN [id number]) as of 11/03/2017 12:37  Ref. Range 10/15/2017 08:02  Vitamin D, 25-Hydroxy Latest Ref Range: 30.0 - 100.0 ng/mL 57.7    ASSESSMENT AND PLAN: Vitamin D deficiency - Plan: Vitamin D, Ergocalciferol, (DRISDOL) 50000 units CAPS capsule  Class 1 obesity with serious comorbidity and body mass index (BMI) of 30.0 to 30.9 in adult, unspecified obesity type - Starting BMI greater then 30  PLAN:  Vitamin D Deficiency Courtney [name] was informed that low vitamin D levels contributes to fatigue and are associated with obesity, breast, and colon cancer. Courtney [name] agrees to continue taking prescription Vit D _0 ,000 IU every week #4 and we will refill for 1 month. She will follow up for routine testing of vitamin D, at least 2-3 times per year. She was informed of the risk of over-replacement of vitamin D and agrees to not increase her dose unless she discusses this with [place] first. We will recheck labs in 3 months. Loranda agrees to follow up with our clinic in 3 weeks.  Obesity Courtney [name] is currently in the action stage of change. As such, her goal is to continue with weight loss efforts She has agreed to follow the Category 1 plan Courtney [name] has been instructed to work up to a goal of 150 minutes of combined cardio and strengthening exercise per week for weight loss and overall health benefits. We discussed  the following Behavioral Modification Strategies today: increasing lean protein intake, work on meal planning and easy cooking plans, travel eating strategies   Courtney [name] has agreed to follow up with our clinic in 3 weeks. She was informed of the importance of frequent follow up visits to maximize her success  with intensive lifestyle modifications for her multiple health conditions.   OBESITY BEHAVIORAL INTERVENTION VISIT  Today's visit was # 8   Starting weight: 157 lbs Starting date: 06/16/17 Today's weight : 146 lbs  Today's date: 10/30/2017 Total lbs lost to date: 11 At least 15 minutes were spent on discussing the following behavioral intervention visit.   ASK: We discussed the diagnosis of obesity with Courtney [name] today and Courtney [name] agreed to give [place] permission to discuss obesity behavioral modification therapy today.  ASSESS: Courtney [name] has the diagnosis of obesity and her BMI today is 28.51 Courtney [name] is in the action stage of change   ADVISE: Sharel was educated on the multiple health risks of obesity as well as the benefit of weight loss to improve her health. She was advised of the need for long term treatment and the importance of lifestyle modifications to improve her current health and to decrease her risk of future health problems.  AGREE: Multiple dietary modification options and treatment options were discussed and  Courtney [name] agreed to follow the recommendations documented in the above note.  ARRANGE: Courtney [name] was educated on the importance of frequent visits to treat obesity as outlined per CMS and USPSTF guidelines and agreed to schedule her next follow up appointment today.  I, Trixie Dredge, am acting as transcriptionist for Dennard Nip, MD  I have reviewed the above documentation for accuracy and completeness, and I agree with the above. -Dennard Nip, MD

## 2017-11-17 ENCOUNTER — Encounter: Payer: Self-pay | Admitting: Nurse Practitioner

## 2017-11-17 ENCOUNTER — Ambulatory Visit (INDEPENDENT_AMBULATORY_CARE_PROVIDER_SITE_OTHER): Payer: Medicare HMO | Admitting: Nurse Practitioner

## 2017-11-17 VITALS — BP 130/78 | HR 44 | Ht 60.0 in | Wt 146.0 lb

## 2017-11-17 DIAGNOSIS — I10 Essential (primary) hypertension: Secondary | ICD-10-CM | POA: Diagnosis not present

## 2017-11-17 DIAGNOSIS — K2100 Gastro-esophageal reflux disease with esophagitis, without bleeding: Secondary | ICD-10-CM

## 2017-11-17 DIAGNOSIS — E7800 Pure hypercholesterolemia, unspecified: Secondary | ICD-10-CM

## 2017-11-17 DIAGNOSIS — Z23 Encounter for immunization: Secondary | ICD-10-CM

## 2017-11-17 DIAGNOSIS — Z0000 Encounter for general adult medical examination without abnormal findings: Secondary | ICD-10-CM | POA: Diagnosis not present

## 2017-11-17 MED ORDER — OMEPRAZOLE 20 MG PO CPDR: 40.0000 mg | DELAYED_RELEASE_CAPSULE | Freq: Every day | ORAL | 1 refills | Status: DC

## 2017-11-17 NOTE — Assessment & Plan Note (Addendum)
Stable Due to no recent symptoms and patient request will try to reduce dosage to 20 mg daily Instructed to follow up in 1 month, or sooner if she experiences recurrent symptoms - omeprazole (PRILOSEC) 20 MG capsule; Take 2 capsules (40 mg total) by mouth daily.  Dispense: 30 capsule; Refill: 1

## 2017-11-17 NOTE — Assessment & Plan Note (Signed)
Stable Labs are up to date Continue current meds Continue regular follow up with cardiology as instructed

## 2017-11-17 NOTE — Assessment & Plan Note (Signed)
-  USPSTF grade A and B recommendations reviewed with patient; age-appropriate recommendations, preventive care, screening tests, etc discussed and encouraged; healthy living encouraged; see AVS for patient education given to patient Follow up and care instructions discussed and provided in AVS.  -Reviewed Health Maintenance: Need for influenza vaccination- Flu vaccine HIGH DOSE PF

## 2017-11-17 NOTE — Assessment & Plan Note (Addendum)
Stable Continue current meds Labs are up to date Continue regular follow up with cardiology as instructed

## 2017-11-17 NOTE — Progress Notes (Signed)
Name: Courtney [name]   MRN: [id number]    DOB: 30-Oct-1950   Date:11/17/2017       Progress Note  Subjective  Chief Complaint CPE  HPI  Patient presents for annual CPE. We will also review her GERD, she is wanting to decrease/stop her prilosec due to no recent symptoms of GERD and recent dietary changes, eating healthier diet. She declines labs today, says all labs are done by health and wellness provider.  Diet, Exercise: following with [facility] and wellness, actively working on diet and exercise to lose weight   USPSTF grade A and B recommendations:  Depression: no concerns Depression screen [facility] 2/9 06/16/2017 02/25/2017 01/06/2015  Decreased Interest 1 0 0  Down, Depressed, Hopeless 1 0 0  PHQ - 2 Score 2 0 0  Altered sleeping 0 - -  Tired, decreased energy 1 - -  Change in appetite 1 - -  Feeling bad or failure about yourself  1 - -  Trouble concentrating 0 - -  Moving slowly or fidgety/restless 0 - -  Suicidal thoughts 0 - -  PHQ-9 Score 5 - -  Difficult doing work/chores Not difficult at all - -   Hypertension: maintained on daily medications by cardiology BP Readings from Last 3 Encounters:  11/17/17 130/78  10/30/17 108/64  10/15/17 (!) 131/59   Obesity: Wt Readings from Last 3 Encounters:  11/17/17 146 lb (66.2 kg)  10/30/17 146 lb (66.2 kg)  10/15/17 146 lb (66.2 kg)   BMI Readings from Last 3 Encounters:  11/17/17 28.51 kg/m  10/30/17 28.51 kg/m  10/15/17 28.51 kg/m    Alcohol: 2 glasses of wine 5 days a week Tobacco use: no, never  Hep C: screening done in past STD testing and prevention (chl/gon/syphilis): no concerns, declines screening  Intimate partner violence: feels safe  Vaccinations: flu vacc today  Breast cancer: mammogram up to date, ordered by GYN Cervical cancer screening: PAP up to date, followed by GYN  Osteoporosis: last DEXA 2017, she states followed by GYN  Lipids: maintained on rosuvastatin, zetia  by cardiology Lab Results   Component Value Date   CHOL 139 10/15/2017   CHOL 161 06/16/2017   CHOL 150 10/29/2016   Lab Results  Component Value Date   HDL 50 10/15/2017   HDL 61 06/16/2017   HDL 60 10/29/2016   Lab Results  Component Value Date   LDLCALC 66 10/15/2017   LDLCALC 74 06/16/2017   LDLCALC 73 10/29/2016   Lab Results  Component Value Date   TRIG 114 10/15/2017   TRIG 128 06/16/2017   TRIG 87 10/29/2016   Lab Results  Component Value Date   CHOLHDL 2.5 10/29/2016   CHOLHDL 3 11/30/2015   CHOLHDL 4 01/09/2015   No results found for: LDLDIRECT  Glucose:  Glucose  Date Value Ref Range Status  06/16/2017 86 65 - 99 mg/dL Final   Glucose, Bld  Date Value Ref Range Status  10/14/2017 92 70 - 99 mg/dL Final  11/30/2015 96 70 - 99 mg/dL Final  01/09/2015 94 70 - 99 mg/dL Final    Colorectal cancer: colonoscopy up to date  Aspirin: 81 daily ECG: not indicated  Patient Active Problem List   Diagnosis Date Noted  . Headache 10/15/2017  . Elevated sed rate 10/15/2017  . Sphenoid sinusitis 10/15/2017  . Obesity 07/15/2017  . Insomnia 07/15/2017  . Female stress incontinence 07/15/2017  . Anxiety 07/15/2017  . Anal fissure 02/25/2017  . Gastroesophageal reflux disease  with esophagitis 02/25/2017  . Vertigo 01/05/2016  . Medicare annual wellness visit, subsequent 11/29/2015  . Osteopenia 06/27/2015  . Routine general medical examination at a health care facility 01/06/2015  . Osteoarthritis of right hip 07/08/2014  . Sinus bradycardia 08/27/2012  . Depression   . Carotid artery disease ([place])   . PVC (premature ventricular contraction)   . Aortic valve sclerosis   . Hyperlipidemia   . Coronary artery disease   . CAD (coronary artery disease)     Past Surgical History:  Procedure Laterality Date  . 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/01/2005  . carcinoid tumor removal    . CARDIAC CATHETERIZATION  1/211   Patent RCA stent, stenotic PLB supplying small distribution; medically  managed  . CORONARY ANGIOPLASTY WITH STENT PLACEMENT  11/2007   BMS-mid RCA  . DIAGNOSTIC LAPAROSCOPY     1982 for endometriosis  . DILATION AND CURETTAGE OF UTERUS  2005  . hx of wisdom teeth surgery    . SKIN CANCER EXCISION    . TOTAL HIP ARTHROPLASTY Right 07/08/2014   Procedure: RIGHT TOTAL HIP ARTHROPLASTY ANTERIOR APPROACH;  Surgeon: Mcarthur Rossetti, MD;  Location: [facility];  Service: Orthopedics;  Laterality: Right;    Family History  Problem Relation Age of Onset  . Heart attack Father 70  . Hyperlipidemia Father   . Hypertension Father   . Sudden death Father   . Stroke Mother   . Hyperlipidemia Mother   . Hypertension Mother   . Heart disease Mother   . Obesity Mother   . Heart attack Paternal Grandfather   . Heart disease Sister   . BRCA 1/2 Neg Hx   . Breast cancer Neg Hx     Social History   Socioeconomic History  . Marital status: Divorced    Spouse name: Not on file  . Number of children: 1  . Years of education: 49  . Highest education level: Not on file  Occupational History  . Occupation: Retired - Retail buyer  Social Needs  . Financial resource strain: Not on file  . Food insecurity:    Worry: Not on file    Inability: Not on file  . Transportation needs:    Medical: Not on file    Non-medical: Not on file  Tobacco Use  . Smoking status: Never Smoker  . Smokeless tobacco: Never Used  Substance and Sexual Activity  . Alcohol use: Yes    Alcohol/week: 7.0 standard drinks    Types: 7 Glasses of wine per week    Comment: 1 glass of wine/night  . Drug use: No  . Sexual activity: Yes  Lifestyle  . Physical activity:    Days per week: Not on file    Minutes per session: Not on file  . Stress: Not on file  Relationships  . Social connections:    Talks on phone: Not on file    Gets together: Not on file    Attends religious service: Not on file    Active member of club or organization: Not on file    Attends meetings of clubs or  organizations: Not on file    Relationship status: Not on file  . Intimate partner violence:    Fear of current or ex partner: Not on file    Emotionally abused: Not on file    Physically abused: Not on file    Forced sexual activity: Not on file  Other Topics Concern  . Not on file  Social  History Narrative   Fun: Dance, garden,    Denies religious beliefs effecting health care.   Denies abuse and feels safe at home     Current Outpatient Medications:  .  acetaminophen (TYLENOL) 500 MG tablet, Take 1,000 mg by mouth every 6 (six) hours as needed (Pain). , Disp: , Rfl:  .  Alpha-D-Galactosidase (BEANO) TABS, Take 1 tablet by mouth daily., Disp: , Rfl:  .  aspirin 81 MG tablet, Take 81 mg by mouth daily., Disp: , Rfl:  .  diclofenac sodium (VOLTAREN) 1 % GEL, Apply 2 g topically 4 (four) times daily. Rub into affected area of foot 2 to 4 times daily, Disp: 100 g, Rfl: 2 .  Docusate Calcium (STOOL SOFTENER PO), Take 1 tablet by mouth daily as needed for constipation., Disp: , Rfl:  .  ezetimibe (ZETIA) 10 MG tablet, TAKE 1 TABLET (10 MG TOTAL) BY MOUTH DAILY., Disp: 90 tablet, Rfl: 3 .  metoprolol tartrate (LOPRESSOR) 25 MG tablet, TAKE 1/2 TABLET TWICE DAILY, Disp: 90 tablet, Rfl: 3 .  MIRALAX powder, Take 17 g by mouth daily as needed for constipation., Disp: , Rfl:  .  nitroGLYCERIN (NITROSTAT) 0.4 MG SL tablet, Place 1 tablet (0.4 mg total) under the tongue every 5 (five) minutes as needed for chest pain., Disp: 25 tablet, Rfl: 3 .  omeprazole (PRILOSEC) 40 MG capsule, Take 1 capsule (40 mg total) by mouth daily., Disp: 90 capsule, Rfl: 3 .  ramipril (ALTACE) 5 MG capsule, TAKE 1 CAPSULE EVERY DAY, Disp: 90 capsule, Rfl: 3 .  rosuvastatin (CRESTOR) 40 MG tablet, TAKE 1 TABLET EVERY DAY, Disp: 90 tablet, Rfl: 2 .  Vitamin D, Ergocalciferol, (DRISDOL) 50000 units CAPS capsule, Take 1 capsule (50,000 Units total) by mouth every 7 (seven) days., Disp: 4 capsule, Rfl: 0  Allergies   Allergen Reactions  . Erythromycin Hives  . Penicillins Hives    Has patient had a PCN reaction causing immediate rash, facial/tongue/throat swelling, SOB or lightheadedness with hypotension: No Has patient had a PCN reaction causing severe rash involving mucus membranes or skin necrosis: No Has patient had a PCN reaction that required hospitalization: No Has patient had a PCN reaction occurring within the last 10 years: No If all of the above answers are "NO", then may proceed with Cephalosporin use.   . Shellfish Allergy Hives     ROS  Constitutional: Negative for fever or weight change.  Respiratory: Negative for cough and shortness of breath.   Cardiovascular: Negative for chest pain or palpitations.  Gastrointestinal: Negative for abdominal pain, no bowel changes.  Musculoskeletal: Negative for gait problem or joint swelling.  Skin: Negative for rash.  Neurological: Negative for dizziness or headache.  No other specific complaints in a complete review of systems (except as listed in HPI above).   Objective  Vitals:   11/17/17 0958  BP: 130/78  Pulse: (!) 44  SpO2: 99%  Weight: 146 lb (66.2 kg)  Height: 5' (1.524 m)  stable, followed by cardiology  Body mass index is 28.51 kg/m.  Physical Exam Vital signs reviewed. Constitutional: Patient appears well-developed and well-nourished. No distress.  HENT: Head: Normocephalic and atraumatic. Ears: B TMs ok, no erythema or effusion; Nose: Nose normal. Mouth/Throat: Oropharynx is clear and moist. No oropharyngeal exudate.  Eyes: Conjunctivae and EOM are normal. Pupils are equal, round, and reactive to light. No scleral icterus.  Neck: Normal range of motion. Neck supple. No cervical adenopathy. No thyromegaly present.  Cardiovascular: Normal rate,  regular rhythm and normal heart sounds.  Murmur heard. No BLE edema. Pulmonary/Chest: Effort normal and breath sounds normal. No respiratory distress. Abdominal: Soft, no  distension. Breast: defd to GYN FEMALE GENITALIA:  Defd to GYN Musculoskeletal: Normal range of motion,  No gross deformities Neurological: She is alert and oriented to person, place, and time. No cranial nerve deficit. Coordination, balance, strength, speech and gait are normal.  Skin: Skin is warm and dry. No rash noted. No erythema.  Psychiatric: Patient has a normal mood and affect. behavior is normal. Judgment and thought content normal.   Assessment & Plan RTC in 1 year for CPE most recent CBC, CMET, TSH, A1c, lipid panel reviewed from past year-stable

## 2017-11-17 NOTE — Patient Instructions (Signed)
Please decrease your prilosec to 52m once daily  Let me know how you are doing on the new dosage in about 1 month, or sooner if you experience recurrence of symptoms.  I will plan to see you back in 1 year for annual physical, sooner if needed.  Health Maintenance, Female Adopting a healthy lifestyle and getting preventive care can go a long way to promote health and wellness. Talk with your health care provider about what schedule of regular examinations is right for you. This is a good chance for you to check in with your provider about disease prevention and staying healthy. In between checkups, there are plenty of things you can do on your own. Experts have done a lot of research about which lifestyle changes and preventive measures are most likely to keep you healthy. Ask your health care provider for more information. Weight and diet Eat a healthy diet  Be sure to include plenty of vegetables, fruits, low-fat dairy products, and lean protein.  Do not eat a lot of foods high in solid fats, added sugars, or salt.  Get regular exercise. This is one of the most important things you can do for your health. ? Most adults should exercise for at least 150 minutes each week. The exercise should increase your heart rate and make you sweat (moderate-intensity exercise). ? Most adults should also do strengthening exercises at least twice a week. This is in addition to the moderate-intensity exercise.  Maintain a healthy weight  Body mass index (BMI) is a measurement that can be used to identify possible weight problems. It estimates body fat based on height and weight. Your health care provider can help determine your BMI and help you achieve or maintain a healthy weight.  For females 265years of age and older: ? A BMI below 18.5 is considered underweight. ? A BMI of 18.5 to 24.9 is normal. ? A BMI of 25 to 29.9 is considered overweight. ? A BMI of 30 and above is considered obese.  Watch  levels of cholesterol and blood lipids  You should start having your blood tested for lipids and cholesterol at 69years of age, then have this test every 5 years.  You may need to have your cholesterol levels checked more often if: ? Your lipid or cholesterol levels are high. ? You are older than 69years of age. ? You are at high risk for heart disease.  Cancer screening Lung Cancer  Lung cancer screening is recommended for adults 597836years old who are at high risk for lung cancer because of a history of smoking.  A yearly low-dose CT scan of the lungs is recommended for people who: ? Currently smoke. ? Have quit within the past 15 years. ? Have at least a 30-pack-year history of smoking. A pack year is smoking an average of one pack of cigarettes a day for 1 year.  Yearly screening should continue until it has been 15 years since you quit.  Yearly screening should stop if you develop a health problem that would prevent you from having lung cancer treatment.  Breast Cancer  Practice breast self-awareness. This means understanding how your breasts normally appear and feel.  It also means doing regular breast self-exams. Let your health care provider know about any changes, no matter how small.  If you are in your 20s or 30s, you should have a clinical breast exam (CBE) by a health care provider every 1-3 years as part of a  regular health exam.  If you are 40 or older, have a CBE every year. Also consider having a breast X-ray (mammogram) every year.  If you have a family history of breast cancer, talk to your health care provider about genetic screening.  If you are at high risk for breast cancer, talk to your health care provider about having an MRI and a mammogram every year.  Breast cancer gene (BRCA) assessment is recommended for women who have family members with BRCA-related cancers. BRCA-related cancers include: ? Breast. ? Ovarian. ? Tubal. ? Peritoneal  cancers.  Results of the assessment will determine the need for genetic counseling and BRCA1 and BRCA2 testing.  Cervical Cancer Your health care provider may recommend that you be screened regularly for cancer of the pelvic organs (ovaries, uterus, and vagina). This screening involves a pelvic examination, including checking for microscopic changes to the surface of your cervix (Pap test). You may be encouraged to have this screening done every 3 years, beginning at age 86.  For women ages 51-65, health care providers may recommend pelvic exams and Pap testing every 3 years, or they may recommend the Pap and pelvic exam, combined with testing for human papilloma virus (HPV), every 5 years. Some types of HPV increase your risk of cervical cancer. Testing for HPV may also be done on women of any age with unclear Pap test results.  Other health care providers may not recommend any screening for nonpregnant women who are considered low risk for pelvic cancer and who do not have symptoms. Ask your health care provider if a screening pelvic exam is right for you.  If you have had past treatment for cervical cancer or a condition that could lead to cancer, you need Pap tests and screening for cancer for at least 20 years after your treatment. If Pap tests have been discontinued, your risk factors (such as having a new sexual partner) need to be reassessed to determine if screening should resume. Some women have medical problems that increase the chance of getting cervical cancer. In these cases, your health care provider may recommend more frequent screening and Pap tests.  Colorectal Cancer  This type of cancer can be detected and often prevented.  Routine colorectal cancer screening usually begins at 69 years of age and continues through 69 years of age.  Your health care provider may recommend screening at an earlier age if you have risk factors for colon cancer.  Your health care provider may also  recommend using home test kits to check for hidden blood in the stool.  A small camera at the end of a tube can be used to examine your colon directly (sigmoidoscopy or colonoscopy). This is done to check for the earliest forms of colorectal cancer.  Routine screening usually begins at age 32.  Direct examination of the colon should be repeated every 5-10 years through 69 years of age. However, you may need to be screened more often if early forms of precancerous polyps or small growths are found.  Skin Cancer  Check your skin from head to toe regularly.  Tell your health care provider about any new moles or changes in moles, especially if there is a change in a mole's shape or color.  Also tell your health care provider if you have a mole that is larger than the size of a pencil eraser.  Always use sunscreen. Apply sunscreen liberally and repeatedly throughout the day.  Protect yourself by wearing long sleeves, pants,  a wide-brimmed hat, and sunglasses whenever you are outside.  Heart disease, diabetes, and high blood pressure  High blood pressure causes heart disease and increases the risk of stroke. High blood pressure is more likely to develop in: ? People who have blood pressure in the high end of the normal range (130-139/85-89 mm Hg). ? People who are overweight or obese. ? People who are African American.  If you are 28-66 years of age, have your blood pressure checked every 3-5 years. If you are 56 years of age or older, have your blood pressure checked every year. You should have your blood pressure measured twice-once when you are at a hospital or clinic, and once when you are not at a hospital or clinic. Record the average of the two measurements. To check your blood pressure when you are not at a hospital or clinic, you can use: ? An automated blood pressure machine at a pharmacy. ? A home blood pressure monitor.  If you are between 50 years and 73 years old, ask your  health care provider if you should take aspirin to prevent strokes.  Have regular diabetes screenings. This involves taking a blood sample to check your fasting blood sugar level. ? If you are at a normal weight and have a low risk for diabetes, have this test once every three years after 69 years of age. ? If you are overweight and have a high risk for diabetes, consider being tested at a younger age or more often. Preventing infection Hepatitis B  If you have a higher risk for hepatitis B, you should be screened for this virus. You are considered at high risk for hepatitis B if: ? You were born in a country where hepatitis B is common. Ask your health care provider which countries are considered high risk. ? Your parents were born in a high-risk country, and you have not been immunized against hepatitis B (hepatitis B vaccine). ? You have HIV or AIDS. ? You use needles to inject street drugs. ? You live with someone who has hepatitis B. ? You have had sex with someone who has hepatitis B. ? You get hemodialysis treatment. ? You take certain medicines for conditions, including cancer, organ transplantation, and autoimmune conditions.  Hepatitis C  Blood testing is recommended for: ? Everyone born from [birth year] through 1967. ? Anyone with known risk factors for hepatitis C.  Sexually transmitted infections (STIs)  You should be screened for sexually transmitted infections (STIs) including gonorrhea and chlamydia if: ? You are sexually active and are younger than 69 years of age. ? You are older than 69 years of age and your health care provider tells you that you are at risk for this type of infection. ? Your sexual activity has changed since you were last screened and you are at an increased risk for chlamydia or gonorrhea. Ask your health care provider if you are at risk.  If you do not have HIV, but are at risk, it may be recommended that you take a prescription medicine daily to  prevent HIV infection. This is called pre-exposure prophylaxis (PrEP). You are considered at risk if: ? You are sexually active and do not regularly use condoms or know the HIV status of your partner(s). ? You take drugs by injection. ? You are sexually active with a partner who has HIV.  Talk with your health care provider about whether you are at high risk of being infected with HIV. If you choose  to begin PrEP, you should first be tested for HIV. You should then be tested every 3 months for as long as you are taking PrEP. Pregnancy  If you are premenopausal and you may become pregnant, ask your health care provider about preconception counseling.  If you may become pregnant, take 400 to 800 micrograms (mcg) of folic acid every day.  If you want to prevent pregnancy, talk to your health care provider about birth control (contraception). Osteoporosis and menopause  Osteoporosis is a disease in which the bones lose minerals and strength with aging. This can result in serious bone fractures. Your risk for osteoporosis can be identified using a bone density scan.  If you are 76 years of age or older, or if you are at risk for osteoporosis and fractures, ask your health care provider if you should be screened.  Ask your health care provider whether you should take a calcium or vitamin D supplement to lower your risk for osteoporosis.  Menopause may have certain physical symptoms and risks.  Hormone replacement therapy may reduce some of these symptoms and risks. Talk to your health care provider about whether hormone replacement therapy is right for you. Follow these instructions at home:  Schedule regular health, dental, and eye exams.  Stay current with your immunizations.  Do not use any tobacco products including cigarettes, chewing tobacco, or electronic cigarettes.  If you are pregnant, do not drink alcohol.  If you are breastfeeding, limit how much and how often you drink  alcohol.  Limit alcohol intake to no more than 1 drink per day for nonpregnant women. One drink equals 12 ounces of beer, 5 ounces of wine, or 1 ounces of hard liquor.  Do not use street drugs.  Do not share needles.  Ask your health care provider for help if you need support or information about quitting drugs.  Tell your health care provider if you often feel depressed.  Tell your health care provider if you have ever been abused or do not feel safe at home. This information is not intended to replace advice given to you by your health care provider. Make sure you discuss any questions you have with your health care provider. Document Released: 08/20/2010 Document Revised: 07/13/2015 Document Reviewed: 11/08/2014 Elsevier Interactive Patient Education  [employer].

## 2017-11-20 ENCOUNTER — Ambulatory Visit (INDEPENDENT_AMBULATORY_CARE_PROVIDER_SITE_OTHER): Payer: Medicare HMO | Admitting: Family Medicine

## 2017-11-20 VITALS — BP 118/63 | HR 44 | Temp 97.6°F | Ht 60.0 in | Wt 141.0 lb

## 2017-11-20 DIAGNOSIS — Z6830 Body mass index (BMI) 30.0-30.9, adult: Secondary | ICD-10-CM | POA: Diagnosis not present

## 2017-11-20 DIAGNOSIS — E559 Vitamin D deficiency, unspecified: Secondary | ICD-10-CM | POA: Diagnosis not present

## 2017-11-20 DIAGNOSIS — E669 Obesity, unspecified: Secondary | ICD-10-CM

## 2017-11-20 MED ORDER — VITAMIN D (ERGOCALCIFEROL) 1.25 MG (50000 UNIT) PO CAPS: 50000.0000 [IU] | ORAL_CAPSULE | ORAL | 0 refills | Status: DC

## 2017-11-20 NOTE — Progress Notes (Signed)
Office: [phone]  /  Fax: [phone]   HPI:   Chief Complaint: OBESITY Courtney [name] is here to discuss her progress with her obesity treatment plan. Courtney [name] is on the Category 1 plan and is following her eating plan approximately 90 % of the time. Courtney [name] states Courtney [name] is exercising 0 minutes 0 times per week. Courtney [name] continues to do well with weight loss on her Category 1 plan. Courtney [name] states her hunger is controlled and Courtney [name] is not skipping meals often. Courtney [name] is not exercising yet but Courtney [name] is ready to start.  Her weight is 141 lb (64 kg) today and has had a weight loss of 5 pounds over a period of 3 weeks since her last visit. Courtney [name] has lost 16 lbs since starting treatment with [place].  Vitamin D Deficiency Courtney [name] has a diagnosis of vitamin D deficiency. Her level is not at goal on prescription Vit D. Courtney [name] denies nausea, vomiting or muscle weakness.  ALLERGIES: Allergies  Allergen Reactions  . Erythromycin Hives  . Penicillins Hives    Has patient had a PCN reaction causing immediate rash, facial/tongue/throat swelling, SOB or lightheadedness with hypotension: No Has patient had a PCN reaction causing severe rash involving mucus membranes or skin necrosis: No Has patient had a PCN reaction that required hospitalization: No Has patient had a PCN reaction occurring within the last 10 years: No If all of the above answers are "NO", then may proceed with Cephalosporin use.   Courtney [name] Kitchen Shellfish Allergy Hives    MEDICATIONS: Current Outpatient Medications on File Prior to Visit  Medication Sig Dispense Refill  . acetaminophen (TYLENOL) 500 MG tablet Take 1,000 mg by mouth every 6 (six) hours as needed (Pain).     . Alpha-D-Galactosidase (BEANO) TABS Take 1 tablet by mouth daily.    Courtney [name] Kitchen aspirin 81 MG tablet Take 81 mg by mouth daily.    . diclofenac sodium (VOLTAREN) 1 % GEL Apply 2 g topically 4 (four) times daily. Rub into affected area of foot 2 to 4 times daily 100 g 2  . Docusate Calcium (STOOL SOFTENER PO) Take 1  tablet by mouth daily as needed for constipation.    Courtney [name] Kitchen ezetimibe (ZETIA) 10 MG tablet TAKE 1 TABLET (10 MG TOTAL) BY MOUTH DAILY. 90 tablet 3  . metoprolol tartrate (LOPRESSOR) 25 MG tablet TAKE 1/2 TABLET TWICE DAILY 90 tablet 3  . MIRALAX powder Take 17 g by mouth daily as needed for constipation.    . nitroGLYCERIN (NITROSTAT) 0.4 MG SL tablet Place 1 tablet (0.4 mg total) under the tongue every 5 (five) minutes as needed for chest pain. 25 tablet 3  . omeprazole (PRILOSEC) 20 MG capsule Take 2 capsules (40 mg total) by mouth daily. (Patient taking differently: Take 20 mg by mouth daily. ) 30 capsule 1  . ramipril (ALTACE) 5 MG capsule TAKE 1 CAPSULE EVERY DAY 90 capsule 3  . rosuvastatin (CRESTOR) 40 MG tablet TAKE 1 TABLET EVERY DAY 90 tablet 2  . Vitamin D, Ergocalciferol, (DRISDOL) 50000 units CAPS capsule Take 1 capsule (50,000 Units total) by mouth every 7 (seven) days. 4 capsule 0   No current facility-administered medications on file prior to visit.     PAST MEDICAL HISTORY: Past Medical History:  Diagnosis Date  . Aortic valve sclerosis    echo 17/4944, soft systolic murmur  . 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 Back pain   . Carotid artery disease ([place])    40-59% bilateral, doppler December 2010  .  Constipation   . Coronary artery disease    a. BMS-mid RCA 11/2007 b. stress echo 02/2009 subtle inferior HK, lateral ST depressions with stress c. follow-up cath 02/2009: RCA stent patent, severe but stable stenosis of distal posterior lateral branch RCA. LV normal, med tx unless more sx c. ETT Myoview 08/27/12: negative for ischemia; EF 64%  . Decreased hearing   . Depression    Mild, August, 2012  . Dyslipidemia   . Easy bruising   . Ejection fraction    EF normal, catheterization, 2011 /  EF 55%, echo, 2010  . Fluid overload    Mild, stable since hospitalization in the past  . GERD (gastroesophageal reflux disease)    occarionally, takes Pepcid over the counter  . Heart murmur     has, occasional PVC's  . History of right hip replacement   . Hyperlipidemia   . Hypertension   . Joint pain   . Myocardial infarction ([place])    2009  . Palpitations   . PVC (premature ventricular contraction)    per prior Holter monitor  . Right hip pain 12/2009  . Shellfish allergy   . Trouble in sleeping   . Vitamin B12 deficiency   . Vitamin D deficiency     PAST SURGICAL HISTORY: Past Surgical History:  Procedure Laterality Date  . 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/01/2005  . carcinoid tumor removal    . CARDIAC CATHETERIZATION  1/211   Patent RCA stent, stenotic PLB supplying small distribution; medically managed  . CORONARY ANGIOPLASTY WITH STENT PLACEMENT  11/2007   BMS-mid RCA  . DIAGNOSTIC LAPAROSCOPY     1982 for endometriosis  . DILATION AND CURETTAGE OF UTERUS  2005  . hx of wisdom teeth surgery    . SKIN CANCER EXCISION    . TOTAL HIP ARTHROPLASTY Right 07/08/2014   Procedure: RIGHT TOTAL HIP ARTHROPLASTY ANTERIOR APPROACH;  Surgeon: Mcarthur Rossetti, MD;  Location: [facility];  Service: Orthopedics;  Laterality: Right;    SOCIAL HISTORY: Social History   Tobacco Use  . Smoking status: Never Smoker  . Smokeless tobacco: Never Used  Substance Use Topics  . Alcohol use: Yes    Alcohol/week: 7.0 standard drinks    Types: 7 Glasses of wine per week    Comment: 1 glass of wine/night  . Drug use: No    FAMILY HISTORY: Family History  Problem Relation Age of Onset  . Heart attack Father 31  . Hyperlipidemia Father   . Hypertension Father   . Sudden death Father   . Stroke Mother   . Hyperlipidemia Mother   . Hypertension Mother   . Heart disease Mother   . Obesity Mother   . Heart attack Paternal Grandfather   . Heart disease Sister   . BRCA 1/2 Neg Hx   . Breast cancer Neg Hx     ROS: Review of Systems  Constitutional: Positive for weight loss.  Gastrointestinal: Negative for nausea and vomiting.  Musculoskeletal:       Negative muscle weakness      PHYSICAL EXAM: Blood pressure 118/63, pulse (!) 44, temperature 97.6 F (36.4 C), temperature source Oral, height 5' (1.524 m), weight 141 lb (64 kg), SpO2 99 %. Body mass index is 27.54 kg/m. Physical Exam  Constitutional: Courtney [name] is oriented to person, place, and time. Courtney [name] appears well-developed and well-nourished.  Cardiovascular: Normal rate.  Pulmonary/Chest: Effort normal.  Musculoskeletal: Normal range of motion.  Neurological: Courtney [name] is oriented to person, place, and  time.  Skin: Skin is warm and dry.  Psychiatric: Courtney [name] has a normal mood and affect. Her behavior is normal.  Vitals reviewed.   RECENT LABS AND TESTS: BMET    Component Value Date/Time   NA 143 10/14/2017 1923   NA 141 06/16/2017 0000   K 4.1 10/14/2017 1923   CL 105 10/14/2017 1923   CO2 27 10/14/2017 1923   GLUCOSE 92 10/14/2017 1923   BUN 26 (H) 10/14/2017 1923   BUN 22 06/16/2017 0000   CREATININE 0.98 10/14/2017 1923   CALCIUM 9.7 10/14/2017 1923   GFRNONAA 58 (L) 10/14/2017 1923   GFRAA >60 10/14/2017 1923   Lab Results  Component Value Date   HGBA1C 5.6 10/15/2017   HGBA1C 5.6 06/16/2017   Lab Results  Component Value Date   INSULIN 5.9 10/15/2017   INSULIN 6.4 06/16/2017   CBC    Component Value Date/Time   WBC 12.2 (H) 10/14/2017 1923   RBC 4.21 10/14/2017 1923   HGB 13.4 10/14/2017 1923   HCT 39.7 10/14/2017 1923   PLT 235 10/14/2017 1923   MCV 94.3 10/14/2017 1923   MCH 31.8 10/14/2017 1923   MCHC 33.8 10/14/2017 1923   RDW 12.9 10/14/2017 1923   LYMPHSABS 2.9 10/14/2017 1923   MONOABS 0.8 10/14/2017 1923   EOSABS 0.2 10/14/2017 1923   BASOSABS 0.0 10/14/2017 1923   Iron/TIBC/Ferritin/ %Sat    Component Value Date/Time   IRON 62 01/09/2015 0806   FERRITIN 26.6 01/09/2015 0806   IRONPCTSAT 15.2 (L) 01/09/2015 0806   Lipid Panel     Component Value Date/Time   CHOL 139 10/15/2017 0802   TRIG 114 10/15/2017 0802   HDL 50 10/15/2017 0802   CHOLHDL 2.5 10/29/2016 1030    CHOLHDL 3 11/30/2015 1028   VLDL 25.4 11/30/2015 1028   LDLCALC 66 10/15/2017 0802   Hepatic Function Panel     Component Value Date/Time   PROT 7.1 06/16/2017 0000   ALBUMIN 4.7 06/16/2017 0000   AST 27 06/16/2017 0000   ALT 20 06/16/2017 0000   ALKPHOS 88 06/16/2017 0000   BILITOT 0.3 06/16/2017 0000   BILIDIR 0.07 10/29/2016 1030      Component Value Date/Time   TSH 0.931 06/16/2017 0000   TSH 1.43 01/09/2015 0806   TSH 0.876 08/26/2012 1722  Results for COURTNEE, MYER (MRN [id number]) as of 11/20/2017 16:51  Ref. Range 10/15/2017 08:02  Vitamin D, 25-Hydroxy Latest Ref Range: 30.0 - 100.0 ng/mL 57.7    ASSESSMENT AND PLAN: Vitamin D deficiency - Plan: Vitamin D, Ergocalciferol, (DRISDOL) 50000 units CAPS capsule  Class 1 obesity with serious comorbidity and body mass index (BMI) of 30.0 to 30.9 in adult, unspecified obesity type - starting BMI >30  PLAN:  Vitamin D Deficiency Courtney [name] was informed that low vitamin D levels contributes to fatigue and are associated with obesity, breast, and colon cancer. Courtney [name] agrees to continue taking prescription Vit D '@50'$ ,000 IU every week #4 and we will refill for 1 month. Courtney [name] will follow up for routine testing of vitamin D, at least 2-3 times per year. Courtney [name] was informed of the risk of over-replacement of vitamin D and agrees to not increase her dose unless Courtney [name] discusses this with [place] first. Courtney [name] agrees to follow up with our clinic in 3 to 4 weeks.  Obesity Courtney [name] is currently in the action stage of change. As such, her goal is to continue with weight loss efforts Courtney [name] has agreed to follow the Category  1 plan Courtney [name] has been instructed to work up to a goal of 150 minutes of combined cardio and strengthening exercise per week or add strengthening exercise for weight loss and overall health benefits. We discussed the following Behavioral Modification Strategies today: increasing lean protein intake and decreasing simple carbohydrates    Courtney [name]  has agreed to follow up with our clinic in 3 to 4 weeks. Courtney [name] was informed of the importance of frequent follow up visits to maximize her success with intensive lifestyle modifications for her multiple health conditions.   OBESITY BEHAVIORAL INTERVENTION VISIT  Today's visit was # 9   Starting weight: 157 lbs Starting date: 06/16/17 Today's weight : 141 lbs  Today's date: 11/20/2017 Total lbs lost to date: 16 At least 15 minutes were spent on discussing the following behavioral intervention visit.   ASK: We discussed the diagnosis of obesity with Courtney [name] today and Courtney [name] agreed to give [place] permission to discuss obesity behavioral modification therapy today.  ASSESS: Courtney [name] has the diagnosis of obesity and her BMI today is 27.54 Courtney [name] is in the action stage of change   ADVISE: Courtney [name] was educated on the multiple health risks of obesity as well as the benefit of weight loss to improve her health. Courtney [name] was advised of the need for long term treatment and the importance of lifestyle modifications to improve her current health and to decrease her risk of future health problems.  AGREE: Multiple dietary modification options and treatment options were discussed and  Courtney [name] agreed to follow the recommendations documented in the above note.  ARRANGE: Courtney [name] was educated on the importance of frequent visits to treat obesity as outlined per CMS and USPSTF guidelines and agreed to schedule her next follow up appointment today.  I, Trixie Dredge, am acting as transcriptionist for Dennard Nip, MD  I have reviewed the above documentation for accuracy and completeness, and I agree with the above. -Dennard Nip, MD

## 2017-12-02 DIAGNOSIS — M8580 Other specified disorders of bone density and structure, unspecified site: Secondary | ICD-10-CM | POA: Diagnosis not present

## 2017-12-02 DIAGNOSIS — R12 Heartburn: Secondary | ICD-10-CM | POA: Diagnosis not present

## 2017-12-05 ENCOUNTER — Encounter (INDEPENDENT_AMBULATORY_CARE_PROVIDER_SITE_OTHER): Payer: Self-pay | Admitting: Family Medicine

## 2017-12-05 ENCOUNTER — Encounter: Payer: Self-pay | Admitting: Nurse Practitioner

## 2017-12-08 ENCOUNTER — Ambulatory Visit: Payer: Medicare HMO | Admitting: Neurology

## 2017-12-08 ENCOUNTER — Other Ambulatory Visit: Payer: Self-pay | Admitting: Nurse Practitioner

## 2017-12-08 DIAGNOSIS — K2100 Gastro-esophageal reflux disease with esophagitis, without bleeding: Secondary | ICD-10-CM

## 2017-12-08 MED ORDER — OMEPRAZOLE 40 MG PO CPDR: 40.0000 mg | DELAYED_RELEASE_CAPSULE | Freq: Every day | ORAL | Status: DC

## 2017-12-08 NOTE — Telephone Encounter (Signed)
Could you please enter into Epic?

## 2017-12-15 ENCOUNTER — Ambulatory Visit (INDEPENDENT_AMBULATORY_CARE_PROVIDER_SITE_OTHER): Payer: Medicare HMO | Admitting: Family Medicine

## 2017-12-15 VITALS — BP 115/54 | HR 43 | Temp 97.3°F | Ht 60.0 in | Wt 142.0 lb

## 2017-12-15 DIAGNOSIS — E559 Vitamin D deficiency, unspecified: Secondary | ICD-10-CM | POA: Diagnosis not present

## 2017-12-15 DIAGNOSIS — E669 Obesity, unspecified: Secondary | ICD-10-CM

## 2017-12-15 DIAGNOSIS — Z6830 Body mass index (BMI) 30.0-30.9, adult: Secondary | ICD-10-CM

## 2017-12-15 MED ORDER — VITAMIN D (ERGOCALCIFEROL) 1.25 MG (50000 UNIT) PO CAPS: 50000.0000 [IU] | ORAL_CAPSULE | ORAL | 0 refills | Status: DC

## 2017-12-16 NOTE — Progress Notes (Signed)
Office: [phone]  /  Fax: [phone]   HPI:   Chief Complaint: OBESITY Courtney [name] is here to discuss her progress with her obesity treatment plan. She is on the Category 1 plan and is following her eating plan approximately 70 % of the time. She states she is walking for 35-55 minutes 5 times per week. Suvi has added walking to her routine most days. She has deviated from her plan more with increased eating out and making other recipes but still trying to be mindful of her choices.  Her weight is 142 lb (64.4 kg) today and has gained 1 pound since her last visit. She has lost 15 lbs since starting treatment with [place].  Vitamin D Deficiency Gypsy has a diagnosis of vitamin D deficiency. She is stable on prescription Vit D and denies nausea, vomiting or muscle weakness.  ALLERGIES: Allergies  Allergen Reactions  . Erythromycin Hives  . Penicillins Hives    Has patient had a PCN reaction causing immediate rash, facial/tongue/throat swelling, SOB or lightheadedness with hypotension: No Has patient had a PCN reaction causing severe rash involving mucus membranes or skin necrosis: No Has patient had a PCN reaction that required hospitalization: No Has patient had a PCN reaction occurring within the last 10 years: No If all of the above answers are "NO", then may proceed with Cephalosporin use.   [name] Shellfish Allergy Hives    MEDICATIONS: Current Outpatient Medications on File Prior to Visit  Medication Sig Dispense Refill  . acetaminophen (TYLENOL) 500 MG tablet Take 1,000 mg by mouth every 6 (six) hours as needed (Pain).     . Alpha-D-Galactosidase (BEANO) TABS Take 1 tablet by mouth daily.    [name] aspirin 81 MG tablet Take 81 mg by mouth daily.    . diclofenac sodium (VOLTAREN) 1 % GEL Apply 2 g topically 4 (four) times daily. Rub into affected area of foot 2 to 4 times daily 100 g 2  . Docusate Calcium (STOOL SOFTENER PO) Take 1 tablet by mouth daily as needed for constipation.    [name]  ezetimibe (ZETIA) 10 MG tablet TAKE 1 TABLET (10 MG TOTAL) BY MOUTH DAILY. 90 tablet 3  . metoprolol tartrate (LOPRESSOR) 25 MG tablet TAKE 1/2 TABLET TWICE DAILY 90 tablet 3  . MIRALAX powder Take 17 g by mouth daily as needed for constipation.    . nitroGLYCERIN (NITROSTAT) 0.4 MG SL tablet Place 1 tablet (0.4 mg total) under the tongue every 5 (five) minutes as needed for chest pain. 25 tablet 3  . omeprazole (PRILOSEC) 40 MG capsule Take 1 capsule (40 mg total) by mouth daily. 90 capsule   . ramipril (ALTACE) 5 MG capsule TAKE 1 CAPSULE EVERY DAY 90 capsule 3  . rosuvastatin (CRESTOR) 40 MG tablet TAKE 1 TABLET EVERY DAY 90 tablet 2   No current facility-administered medications on file prior to visit.     PAST MEDICAL HISTORY: Past Medical History:  Diagnosis Date  . Aortic valve sclerosis    echo 96/2836, soft systolic murmur  . 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 Back pain   . Carotid artery disease ([place])    40-59% bilateral, doppler December 2010  . Constipation   . Coronary artery disease    a. BMS-mid RCA 11/2007 b. stress echo 02/2009 subtle inferior HK, lateral ST depressions with stress c. follow-up cath 02/2009: RCA stent patent, severe but stable stenosis of distal posterior lateral branch RCA. LV normal, med tx unless more  sx c. ETT Myoview 08/27/12: negative for ischemia; EF 64%  . Decreased hearing   . Depression    Mild, August, 2012  . Dyslipidemia   . Easy bruising   . Ejection fraction    EF normal, catheterization, 2011 /  EF 55%, echo, 2010  . Fluid overload    Mild, stable since hospitalization in the past  . GERD (gastroesophageal reflux disease)    occarionally, takes Pepcid over the counter  . Heart murmur    has, occasional PVC's  . History of right hip replacement   . Hyperlipidemia   . Hypertension   . Joint pain   . Myocardial infarction ([place])    2009  . Palpitations   . PVC (premature ventricular contraction)    per prior Holter monitor  . Right  hip pain 12/2009  . Shellfish allergy   . Trouble in sleeping   . Vitamin B12 deficiency   . Vitamin D deficiency     PAST SURGICAL HISTORY: Past Surgical History:  Procedure Laterality Date  . 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/01/2005  . carcinoid tumor removal    . CARDIAC CATHETERIZATION  1/211   Patent RCA stent, stenotic PLB supplying small distribution; medically managed  . CORONARY ANGIOPLASTY WITH STENT PLACEMENT  11/2007   BMS-mid RCA  . DIAGNOSTIC LAPAROSCOPY     1982 for endometriosis  . DILATION AND CURETTAGE OF UTERUS  2005  . hx of wisdom teeth surgery    . SKIN CANCER EXCISION    . TOTAL HIP ARTHROPLASTY Right 07/08/2014   Procedure: RIGHT TOTAL HIP ARTHROPLASTY ANTERIOR APPROACH;  Surgeon: Mcarthur Rossetti, MD;  Location: [facility];  Service: Orthopedics;  Laterality: Right;    SOCIAL HISTORY: Social History   Tobacco Use  . Smoking status: Never Smoker  . Smokeless tobacco: Never Used  Substance Use Topics  . Alcohol use: Yes    Alcohol/week: 7.0 standard drinks    Types: 7 Glasses of wine per week    Comment: 1 glass of wine/night  . Drug use: No    FAMILY HISTORY: Family History  Problem Relation Age of Onset  . Heart attack Father 20  . Hyperlipidemia Father   . Hypertension Father   . Sudden death Father   . Stroke Mother   . Hyperlipidemia Mother   . Hypertension Mother   . Heart disease Mother   . Obesity Mother   . Heart attack Paternal Grandfather   . Heart disease Sister   . BRCA 1/2 Neg Hx   . Breast cancer Neg Hx     ROS: Review of Systems  Constitutional: Negative for weight loss.  Gastrointestinal: Negative for nausea and vomiting.  Musculoskeletal:       Negative muscle weakness    PHYSICAL EXAM: Blood pressure (!) 115/54, pulse (!) 43, temperature (!) 97.3 F (36.3 C), temperature source Oral, height 5' (1.524 m), weight 142 lb (64.4 kg), SpO2 97 %. Body mass index is 27.73 kg/m. Physical Exam  Constitutional: She is  oriented to person, place, and time. She appears well-developed and well-nourished.  Cardiovascular: Normal rate.  Pulmonary/Chest: Effort normal.  Musculoskeletal: Normal range of motion.  Neurological: She is oriented to person, place, and time.  Skin: Skin is warm and dry.  Psychiatric: She has a normal mood and affect. Her behavior is normal.  Vitals reviewed.   RECENT LABS AND TESTS: BMET    Component Value Date/Time   NA 143 10/14/2017 1923   NA 141 06/16/2017  0000   K 4.1 10/14/2017 1923   CL 105 10/14/2017 1923   CO2 27 10/14/2017 1923   GLUCOSE 92 10/14/2017 1923   BUN 26 (H) 10/14/2017 1923   BUN 22 06/16/2017 0000   CREATININE 0.98 10/14/2017 1923   CALCIUM 9.7 10/14/2017 1923   GFRNONAA 58 (L) 10/14/2017 1923   GFRAA >60 10/14/2017 1923   Lab Results  Component Value Date   HGBA1C 5.6 10/15/2017   HGBA1C 5.6 06/16/2017   Lab Results  Component Value Date   INSULIN 5.9 10/15/2017   INSULIN 6.4 06/16/2017   CBC    Component Value Date/Time   WBC 12.2 (H) 10/14/2017 1923   RBC 4.21 10/14/2017 1923   HGB 13.4 10/14/2017 1923   HCT 39.7 10/14/2017 1923   PLT 235 10/14/2017 1923   MCV 94.3 10/14/2017 1923   MCH 31.8 10/14/2017 1923   MCHC 33.8 10/14/2017 1923   RDW 12.9 10/14/2017 1923   LYMPHSABS 2.9 10/14/2017 1923   MONOABS 0.8 10/14/2017 1923   EOSABS 0.2 10/14/2017 1923   BASOSABS 0.0 10/14/2017 1923   Iron/TIBC/Ferritin/ %Sat    Component Value Date/Time   IRON 62 01/09/2015 0806   FERRITIN 26.6 01/09/2015 0806   IRONPCTSAT 15.2 (L) 01/09/2015 0806   Lipid Panel     Component Value Date/Time   CHOL 139 10/15/2017 0802   TRIG 114 10/15/2017 0802   HDL 50 10/15/2017 0802   CHOLHDL 2.5 10/29/2016 1030   CHOLHDL 3 11/30/2015 1028   VLDL 25.4 11/30/2015 1028   LDLCALC 66 10/15/2017 0802   Hepatic Function Panel     Component Value Date/Time   PROT 7.1 06/16/2017 0000   ALBUMIN 4.7 06/16/2017 0000   AST 27 06/16/2017 0000   ALT 20  06/16/2017 0000   ALKPHOS 88 06/16/2017 0000   BILITOT 0.3 06/16/2017 0000   BILIDIR 0.07 10/29/2016 1030      Component Value Date/Time   TSH 0.931 06/16/2017 0000   TSH 1.43 01/09/2015 0806   TSH 0.876 08/26/2012 1722  Results for GITA, DILGER (MRN [id number]) as of 12/16/2017 18:53  Ref. Range 10/15/2017 08:02  Vitamin D, 25-Hydroxy Latest Ref Range: 30.0 - 100.0 ng/mL 57.7    ASSESSMENT AND PLAN: Vitamin D deficiency - Plan: Vitamin D, Ergocalciferol, (DRISDOL) 50000 units CAPS capsule  Class 1 obesity with serious comorbidity and body mass index (BMI) of 30.0 to 30.9 in adult, unspecified obesity type - Starting BMI greater then 30  PLAN:  Vitamin D Deficiency Rebie was informed that low vitamin D levels contributes to fatigue and are associated with obesity, breast, and colon cancer. Arlina agrees to continue taking prescription Vit D _0 ,000 IU every week #4 and we will refill for 1 month. She will follow up for routine testing of vitamin D, at least 2-3 times per year. She was informed of the risk of over-replacement of vitamin D and agrees to not increase her dose unless she discusses this with [place] first. Mackenize agrees to follow up with our clinic in 3 weeks.  Obesity Marialuisa is currently in the action stage of change. As such, her goal is to continue with weight loss efforts She has agreed to change to keep a food journal with 1000-1200 calories and 70+ grams of protein daily Tanequa has been instructed to work up to a goal of 150 minutes of combined cardio and strengthening exercise per week for weight loss and overall health benefits. We discussed the following Behavioral Modification Strategies today:  increasing lean protein intake, decreasing simple carbohydrates, and keep a strict food journal   Daylen has agreed to follow up with our clinic in 3 weeks. She was informed of the importance of frequent follow up visits to maximize her success with intensive lifestyle  modifications for her multiple health conditions.   OBESITY BEHAVIORAL INTERVENTION VISIT  Today's visit was # 10   Starting weight: 157 lbs Starting date: 06/16/17 Today's weight : 142 lbs Today's date: 12/15/2017 Total lbs lost to date: 15 At least 15 minutes were spent on discussing the following behavioral intervention visit.   ASK: We discussed the diagnosis of obesity with [name] today and Jacobi agreed to give [place] permission to discuss obesity behavioral modification therapy today.  ASSESS: Jeneal has the diagnosis of obesity and her BMI today is 27.73 Geralda is in the action stage of change   ADVISE: Carollyn was educated on the multiple health risks of obesity as well as the benefit of weight loss to improve her health. She was advised of the need for long term treatment and the importance of lifestyle modifications to improve her current health and to decrease her risk of future health problems.  AGREE: Multiple dietary modification options and treatment options were discussed and  Madalene agreed to follow the recommendations documented in the above note.  ARRANGE: Talyia was educated on the importance of frequent visits to treat obesity as outlined per CMS and USPSTF guidelines and agreed to schedule her next follow up appointment today.  I, Trixie Dredge, am acting as transcriptionist for Dennard Nip, MD  I have reviewed the above documentation for accuracy and completeness, and I agree with the above. -Dennard Nip, MD

## 2017-12-17 ENCOUNTER — Encounter (INDEPENDENT_AMBULATORY_CARE_PROVIDER_SITE_OTHER): Payer: Self-pay | Admitting: Family Medicine

## 2018-01-05 ENCOUNTER — Ambulatory Visit (INDEPENDENT_AMBULATORY_CARE_PROVIDER_SITE_OTHER): Payer: Medicare HMO | Admitting: Family Medicine

## 2018-01-06 ENCOUNTER — Ambulatory Visit (INDEPENDENT_AMBULATORY_CARE_PROVIDER_SITE_OTHER): Payer: Medicare HMO | Admitting: Family Medicine

## 2018-01-06 ENCOUNTER — Other Ambulatory Visit (INDEPENDENT_AMBULATORY_CARE_PROVIDER_SITE_OTHER): Payer: Self-pay | Admitting: Family Medicine

## 2018-01-06 VITALS — BP 166/60 | HR 44 | Ht 60.0 in | Wt 144.0 lb

## 2018-01-06 DIAGNOSIS — E559 Vitamin D deficiency, unspecified: Secondary | ICD-10-CM

## 2018-01-06 DIAGNOSIS — Z6830 Body mass index (BMI) 30.0-30.9, adult: Secondary | ICD-10-CM | POA: Diagnosis not present

## 2018-01-06 DIAGNOSIS — E669 Obesity, unspecified: Secondary | ICD-10-CM

## 2018-01-06 MED ORDER — VITAMIN D (ERGOCALCIFEROL) 1.25 MG (50000 UNIT) PO CAPS: 50000.0000 [IU] | ORAL_CAPSULE | ORAL | 0 refills | Status: DC

## 2018-01-07 NOTE — Progress Notes (Signed)
Office: [phone]  /  Fax: [phone]   HPI:   Chief Complaint: OBESITY Courtney [name] is here to discuss her progress with her obesity treatment plan. She was advised to follow the keep a food journal with 1000 to 1200 calories and 70+ grams of protein daily plan and she is following the Category 1 plan and is following her eating plan approximately 90 % of the time. She states she is walking 60 minutes 5 times per week. Courtney [name] has had extra temptations over the last month, but she is still being mindful of her food choices. She has questions about how to handle holiday eating temptations. Her weight is 144 lb (65.3 kg) today and has had a weight gain of 2 pounds over a period of 3 weeks since her last visit. She has lost 13 lbs since starting treatment with [place].  Vitamin D deficiency Courtney [name] has a diagnosis of vitamin D deficiency. She is stable on vit D and denies nausea, vomiting or muscle weakness.   ALLERGIES: Allergies  Allergen Reactions  . Erythromycin Hives  . Penicillins Hives    Has patient had a PCN reaction causing immediate rash, facial/tongue/throat swelling, SOB or lightheadedness with hypotension: No Has patient had a PCN reaction causing severe rash involving mucus membranes or skin necrosis: No Has patient had a PCN reaction that required hospitalization: No Has patient had a PCN reaction occurring within the last 10 years: No If all of the above answers are "NO", then may proceed with Cephalosporin use.   [name] Shellfish Allergy Hives    MEDICATIONS: Current Outpatient Medications on File Prior to Visit  Medication Sig Dispense Refill  . acetaminophen (TYLENOL) 500 MG tablet Take 1,000 mg by mouth every 6 (six) hours as needed (Pain).     . Alpha-D-Galactosidase (BEANO) TABS Take 1 tablet by mouth daily.    [name] aspirin 81 MG tablet Take 81 mg by mouth daily.    . diclofenac sodium (VOLTAREN) 1 % GEL Apply 2 g topically 4 (four) times daily. Rub into affected area of foot  2 to 4 times daily 100 g 2  . Docusate Calcium (STOOL SOFTENER PO) Take 1 tablet by mouth daily as needed for constipation.    [name] ezetimibe (ZETIA) 10 MG tablet TAKE 1 TABLET (10 MG TOTAL) BY MOUTH DAILY. 90 tablet 3  . metoprolol tartrate (LOPRESSOR) 25 MG tablet TAKE 1/2 TABLET TWICE DAILY 90 tablet 3  . MIRALAX powder Take 17 g by mouth daily as needed for constipation.    . nitroGLYCERIN (NITROSTAT) 0.4 MG SL tablet Place 1 tablet (0.4 mg total) under the tongue every 5 (five) minutes as needed for chest pain. 25 tablet 3  . omeprazole (PRILOSEC) 40 MG capsule Take 1 capsule (40 mg total) by mouth daily. 90 capsule   . ramipril (ALTACE) 5 MG capsule TAKE 1 CAPSULE EVERY DAY 90 capsule 3  . rosuvastatin (CRESTOR) 40 MG tablet TAKE 1 TABLET EVERY DAY 90 tablet 2   No current facility-administered medications on file prior to visit.     PAST MEDICAL HISTORY: Past Medical History:  Diagnosis Date  . Aortic valve sclerosis    echo 69/4854, soft systolic murmur  . 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 Back pain   . Carotid artery disease ([place])    40-59% bilateral, doppler December 2010  . Constipation   . Coronary artery disease    a. BMS-mid RCA 11/2007 b. stress echo 02/2009 subtle inferior HK, lateral  ST depressions with stress c. follow-up cath 02/2009: RCA stent patent, severe but stable stenosis of distal posterior lateral branch RCA. LV normal, med tx unless more sx c. ETT Myoview 08/27/12: negative for ischemia; EF 64%  . Decreased hearing   . Depression    Mild, August, 2012  . Dyslipidemia   . Easy bruising   . Ejection fraction    EF normal, catheterization, 2011 /  EF 55%, echo, 2010  . Fluid overload    Mild, stable since hospitalization in the past  . GERD (gastroesophageal reflux disease)    occarionally, takes Pepcid over the counter  . Heart murmur    has, occasional PVC's  . History of right hip replacement   . Hyperlipidemia   . Hypertension   . Joint pain   .  Myocardial infarction ([place])    2009  . Palpitations   . PVC (premature ventricular contraction)    per prior Holter monitor  . Right hip pain 12/2009  . Shellfish allergy   . Trouble in sleeping   . Vitamin B12 deficiency   . Vitamin D deficiency     PAST SURGICAL HISTORY: Past Surgical History:  Procedure Laterality Date  . 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/01/2005  . carcinoid tumor removal    . CARDIAC CATHETERIZATION  1/211   Patent RCA stent, stenotic PLB supplying small distribution; medically managed  . CORONARY ANGIOPLASTY WITH STENT PLACEMENT  11/2007   BMS-mid RCA  . DIAGNOSTIC LAPAROSCOPY     1982 for endometriosis  . DILATION AND CURETTAGE OF UTERUS  2005  . hx of wisdom teeth surgery    . SKIN CANCER EXCISION    . TOTAL HIP ARTHROPLASTY Right 07/08/2014   Procedure: RIGHT TOTAL HIP ARTHROPLASTY ANTERIOR APPROACH;  Surgeon: Mcarthur Rossetti, MD;  Location: [facility];  Service: Orthopedics;  Laterality: Right;    SOCIAL HISTORY: Social History   Tobacco Use  . Smoking status: Never Smoker  . Smokeless tobacco: Never Used  Substance Use Topics  . Alcohol use: Yes    Alcohol/week: 7.0 standard drinks    Types: 7 Glasses of wine per week    Comment: 1 glass of wine/night  . Drug use: No    FAMILY HISTORY: Family History  Problem Relation Age of Onset  . Heart attack Father 64  . Hyperlipidemia Father   . Hypertension Father   . Sudden death Father   . Stroke Mother   . Hyperlipidemia Mother   . Hypertension Mother   . Heart disease Mother   . Obesity Mother   . Heart attack Paternal Grandfather   . Heart disease Sister   . BRCA 1/2 Neg Hx   . Breast cancer Neg Hx     ROS: Review of Systems  Constitutional: Negative for weight loss.  Gastrointestinal: Negative for nausea and vomiting.  Musculoskeletal:       Negative for muscle weakness    PHYSICAL EXAM: Blood pressure (!) 166/60, pulse (!) 44, height 5' (1.524 m), weight 144 lb (65.3 kg), SpO2 99  %. Body mass index is 28.12 kg/m. Physical Exam  Constitutional: She is oriented to person, place, and time. She appears well-developed and well-nourished.  Cardiovascular: Normal rate.  Pulmonary/Chest: Effort normal.  Musculoskeletal: Normal range of motion.  Neurological: She is oriented to person, place, and time.  Skin: Skin is warm and dry.  Psychiatric: She has a normal mood and affect. Her behavior is normal.  Vitals reviewed.   RECENT LABS AND  TESTS: BMET    Component Value Date/Time   NA 143 10/14/2017 1923   NA 141 06/16/2017 0000   K 4.1 10/14/2017 1923   CL 105 10/14/2017 1923   CO2 27 10/14/2017 1923   GLUCOSE 92 10/14/2017 1923   BUN 26 (H) 10/14/2017 1923   BUN 22 06/16/2017 0000   CREATININE 0.98 10/14/2017 1923   CALCIUM 9.7 10/14/2017 1923   GFRNONAA 58 (L) 10/14/2017 1923   GFRAA >60 10/14/2017 1923   Lab Results  Component Value Date   HGBA1C 5.6 10/15/2017   HGBA1C 5.6 06/16/2017   Lab Results  Component Value Date   INSULIN 5.9 10/15/2017   INSULIN 6.4 06/16/2017   CBC    Component Value Date/Time   WBC 12.2 (H) 10/14/2017 1923   RBC 4.21 10/14/2017 1923   HGB 13.4 10/14/2017 1923   HCT 39.7 10/14/2017 1923   PLT 235 10/14/2017 1923   MCV 94.3 10/14/2017 1923   MCH 31.8 10/14/2017 1923   MCHC 33.8 10/14/2017 1923   RDW 12.9 10/14/2017 1923   LYMPHSABS 2.9 10/14/2017 1923   MONOABS 0.8 10/14/2017 1923   EOSABS 0.2 10/14/2017 1923   BASOSABS 0.0 10/14/2017 1923   Iron/TIBC/Ferritin/ %Sat    Component Value Date/Time   IRON 62 01/09/2015 0806   FERRITIN 26.6 01/09/2015 0806   IRONPCTSAT 15.2 (L) 01/09/2015 0806   Lipid Panel     Component Value Date/Time   CHOL 139 10/15/2017 0802   TRIG 114 10/15/2017 0802   HDL 50 10/15/2017 0802   CHOLHDL 2.5 10/29/2016 1030   CHOLHDL 3 11/30/2015 1028   VLDL 25.4 11/30/2015 1028   LDLCALC 66 10/15/2017 0802   Hepatic Function Panel     Component Value Date/Time   PROT 7.1  06/16/2017 0000   ALBUMIN 4.7 06/16/2017 0000   AST 27 06/16/2017 0000   ALT 20 06/16/2017 0000   ALKPHOS 88 06/16/2017 0000   BILITOT 0.3 06/16/2017 0000   BILIDIR 0.07 10/29/2016 1030      Component Value Date/Time   TSH 0.931 06/16/2017 0000   TSH 1.43 01/09/2015 0806   TSH 0.876 08/26/2012 1722   Results for ROHINI, JAROSZEWSKI (MRN [id number]) as of 01/07/2018 12:17  Ref. Range 10/15/2017 08:02  Vitamin D, 25-Hydroxy Latest Ref Range: 30.0 - 100.0 ng/mL 57.7   ASSESSMENT AND PLAN: Vitamin D deficiency - Plan: Vitamin D, Ergocalciferol, (DRISDOL) 1.25 MG (50000 UT) CAPS capsule  Class 1 obesity with serious comorbidity and body mass index (BMI) of 30.0 to 30.9 in adult, unspecified obesity type - BMI greater than 30 at start of program   PLAN:  Vitamin D Deficiency Courtney [name] was informed that low vitamin D levels contributes to fatigue and are associated with obesity, breast, and colon cancer. She agrees to continue to take prescription Vit D '@50'$ ,000 IU every week #4 with no refills and will follow up for routine testing of vitamin D, at least 2-3 times per year. She was informed of the risk of over-replacement of vitamin D and agrees to not increase her dose unless she discusses this with [place] first. Courtney [name] agrees to follow up as directed.  Obesity Courtney [name] is currently in the action stage of change. As such, her goal is to continue with weight loss efforts She has agreed to follow the Category 1 plan Courtney [name] has been instructed to work up to a goal of 150 minutes of combined cardio and strengthening exercise per week for weight loss and overall health benefits.  We discussed the following Behavioral Modification Strategies today: increasing lean protein intake, decreasing simple carbohydrates , work on meal planning and easy cooking plans and holiday eating strategies   Dimonique has agreed to follow up with our clinic in 3 weeks. She was informed of the importance of frequent follow up visits to  maximize her success with intensive lifestyle modifications for her multiple health conditions.   OBESITY BEHAVIORAL INTERVENTION VISIT  Today's visit was # 11   Starting weight: 157 lbs Starting date: 06/16/2017 Today's weight :  144 lbs Today's date: 01/06/2018 Total lbs lost to date: 13 At least 15 minutes were spent on discussing the following behavioral intervention visit.   ASK: We discussed the diagnosis of obesity with Courtney [name] today and Courtney [name] agreed to give [place] permission to discuss obesity behavioral modification therapy today.  ASSESS: Courtney [name] has the diagnosis of obesity and her BMI today is 28.12 Courtney [name] is in the action stage of change   ADVISE: Brittinee was educated on the multiple health risks of obesity as well as the benefit of weight loss to improve her health. She was advised of the need for long term treatment and the importance of lifestyle modifications to improve her current health and to decrease her risk of future health problems.  AGREE: Multiple dietary modification options and treatment options were discussed and  Courtney [name] agreed to follow the recommendations documented in the above note.  ARRANGE: Courtney [name] was educated on the importance of frequent visits to treat obesity as outlined per CMS and USPSTF guidelines and agreed to schedule her next follow up appointment today.  I, Doreene Nest, am acting as transcriptionist for Dennard Nip, MD  I have reviewed the above documentation for accuracy and completeness, and I agree with the above. -Dennard Nip, MD

## 2018-02-03 ENCOUNTER — Ambulatory Visit (INDEPENDENT_AMBULATORY_CARE_PROVIDER_SITE_OTHER): Payer: Medicare HMO | Admitting: Family Medicine

## 2018-02-03 ENCOUNTER — Encounter (INDEPENDENT_AMBULATORY_CARE_PROVIDER_SITE_OTHER): Payer: Self-pay | Admitting: Family Medicine

## 2018-02-03 VITALS — BP 107/61 | HR 49 | Temp 97.6°F | Ht 60.0 in | Wt 142.0 lb

## 2018-02-03 DIAGNOSIS — Z6830 Body mass index (BMI) 30.0-30.9, adult: Secondary | ICD-10-CM | POA: Diagnosis not present

## 2018-02-03 DIAGNOSIS — E7849 Other hyperlipidemia: Secondary | ICD-10-CM

## 2018-02-03 DIAGNOSIS — E669 Obesity, unspecified: Secondary | ICD-10-CM | POA: Diagnosis not present

## 2018-02-04 NOTE — Progress Notes (Signed)
Office: [phone]  /  Fax: [phone]   HPI:   Chief Complaint: OBESITY Courtney [name] is here to discuss her progress with her obesity treatment plan. She is on the Category 1 plan and is following her eating plan approximately 90 % of the time. She states she is walking 6,000 steps per day for 5 times per week. Courtney [name] continues to do well with weight loss over the last few weeks. Her hunger is controlled and she is doing well with meal planning and prepping. She is avoiding simple carbs and mostly eating at home.  Her weight is 142 lb (64.4 kg) today and has had a weight loss of 2 pounds over a period of 4 weeks since her last visit. She has lost 15 lbs since starting treatment with [place].  Hyperlipidemia Courtney [name] has hyperlipidemia and has been trying to improve her cholesterol levels with intensive lifestyle modification including a low saturated fat diet, exercise and weight loss. She is stable on Crestor '40mg'$  and Zetia '10mg'$ . Courtney [name] is doing well with decreasing cholesterol in her diet and is working on weight loss and exercise. She denies any chest pain or myalgias.  ASSESSMENT AND PLAN:  Other hyperlipidemia  Class 1 obesity with serious comorbidity and body mass index (BMI) of 30.0 to 30.9 in adult, unspecified obesity type - Starting BMI greater then 30  PLAN:  Hyperlipidemia Mackensey was informed of the American Heart Association Guidelines emphasizing intensive lifestyle modifications as the first line treatment for hyperlipidemia. We discussed many lifestyle modifications today in depth, and Jakiah will continue to work on decreasing saturated fats such as fatty red meat, butter and many fried foods. She will also increase vegetables and lean protein in her diet and continue to work on exercise and weight loss efforts. Avianah agrees to continue with her diet, exercise, and medications. We will recheck labs in 1 month and she agrees with this plan.  I spent > than 50% of the 15 minute visit on  counseling as documented in the note.  Obesity Courtney [name] is currently in the action stage of change. As such, her goal is to continue with weight loss efforts. She has agreed to follow the Category 1 plan. [name] has been instructed to work up to a goal of 150 minutes of combined cardio and strengthening exercise per week for weight loss and overall health benefits. We discussed the following Behavioral Modification Strategies today: increasing lean protein intake, decreasing simple carbohydrates, work on meal planning and easy cooking plans, holiday eating strategies, and celebration eating strategies.  Courtney [name] has agreed to follow up with our clinic in 3 to 4 weeks. She was informed of the importance of frequent follow up visits to maximize her success with intensive lifestyle modifications for her multiple health conditions.  ALLERGIES: Allergies  Allergen Reactions  . Erythromycin Hives  . Penicillins Hives    Has patient had a PCN reaction causing immediate rash, facial/tongue/throat swelling, SOB or lightheadedness with hypotension: No Has patient had a PCN reaction causing severe rash involving mucus membranes or skin necrosis: No Has patient had a PCN reaction that required hospitalization: No Has patient had a PCN reaction occurring within the last 10 years: No If all of the above answers are "NO", then may proceed with Cephalosporin use.   Courtney [name] Shellfish Allergy Hives    MEDICATIONS: Current Outpatient Medications on File Prior to Visit  Medication Sig Dispense Refill  . acetaminophen (TYLENOL) 500 MG tablet Take 1,000 mg by mouth every 6 (six)  hours as needed (Pain).     . Alpha-D-Galactosidase (BEANO) TABS Take 1 tablet by mouth daily.    Courtney [name] aspirin 81 MG tablet Take 81 mg by mouth daily.    . diclofenac sodium (VOLTAREN) 1 % GEL Apply 2 g topically 4 (four) times daily. Rub into affected area of foot 2 to 4 times daily 100 g 2  . Docusate Calcium (STOOL SOFTENER PO) Take 1 tablet by  mouth daily as needed for constipation.    Courtney [name] ezetimibe (ZETIA) 10 MG tablet TAKE 1 TABLET (10 MG TOTAL) BY MOUTH DAILY. 90 tablet 3  . metoprolol tartrate (LOPRESSOR) 25 MG tablet TAKE 1/2 TABLET TWICE DAILY 90 tablet 3  . MIRALAX powder Take 17 g by mouth daily as needed for constipation.    . nitroGLYCERIN (NITROSTAT) 0.4 MG SL tablet Place 1 tablet (0.4 mg total) under the tongue every 5 (five) minutes as needed for chest pain. 25 tablet 3  . omeprazole (PRILOSEC) 40 MG capsule Take 1 capsule (40 mg total) by mouth daily. 90 capsule   . ramipril (ALTACE) 5 MG capsule TAKE 1 CAPSULE EVERY DAY 90 capsule 3  . rosuvastatin (CRESTOR) 40 MG tablet TAKE 1 TABLET EVERY DAY 90 tablet 2  . Vitamin D, Ergocalciferol, (DRISDOL) 1.25 MG (50000 UT) CAPS capsule Take 1 capsule (50,000 Units total) by mouth every 7 (seven) days. 4 capsule 0   No current facility-administered medications on file prior to visit.     PAST MEDICAL HISTORY: Past Medical History:  Diagnosis Date  . Aortic valve sclerosis    echo 02/270, soft systolic murmur  . 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 Back pain   . Carotid artery disease ([place])    40-59% bilateral, doppler December 2010  . Constipation   . Coronary artery disease    a. BMS-mid RCA 11/2007 b. stress echo 02/2009 subtle inferior HK, lateral ST depressions with stress c. follow-up cath 02/2009: RCA stent patent, severe but stable stenosis of distal posterior lateral branch RCA. LV normal, med tx unless more sx c. ETT Myoview 08/27/12: negative for ischemia; EF 64%  . Decreased hearing   . Depression    Mild, August, 2012  . Dyslipidemia   . Easy bruising   . Ejection fraction    EF normal, catheterization, 2011 /  EF 55%, echo, 2010  . Fluid overload    Mild, stable since hospitalization in the past  . GERD (gastroesophageal reflux disease)    occarionally, takes Pepcid over the counter  . Heart murmur    has, occasional PVC's  . History of right hip replacement    . Hyperlipidemia   . Hypertension   . Joint pain   . Myocardial infarction ([place])    2009  . Palpitations   . PVC (premature ventricular contraction)    per prior Holter monitor  . Right hip pain 12/2009  . Shellfish allergy   . Trouble in sleeping   . Vitamin B12 deficiency   . Vitamin D deficiency     PAST SURGICAL HISTORY: Past Surgical History:  Procedure Laterality Date  . 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/01/2005  . carcinoid tumor removal    . CARDIAC CATHETERIZATION  1/211   Patent RCA stent, stenotic PLB supplying small distribution; medically managed  . CORONARY ANGIOPLASTY WITH STENT PLACEMENT  11/2007   BMS-mid RCA  . DIAGNOSTIC LAPAROSCOPY     1982 for endometriosis  . DILATION AND CURETTAGE OF UTERUS  2005  .  hx of wisdom teeth surgery    . SKIN CANCER EXCISION    . TOTAL HIP ARTHROPLASTY Right 07/08/2014   Procedure: RIGHT TOTAL HIP ARTHROPLASTY ANTERIOR APPROACH;  Surgeon: Mcarthur Rossetti, MD;  Location: [facility];  Service: Orthopedics;  Laterality: Right;    SOCIAL HISTORY: Social History   Tobacco Use  . Smoking status: Never Smoker  . Smokeless tobacco: Never Used  Substance Use Topics  . Alcohol use: Yes    Alcohol/week: 7.0 standard drinks    Types: 7 Glasses of wine per week    Comment: 1 glass of wine/night  . Drug use: No    FAMILY HISTORY: Family History  Problem Relation Age of Onset  . Heart attack Father 38  . Hyperlipidemia Father   . Hypertension Father   . Sudden death Father   . Stroke Mother   . Hyperlipidemia Mother   . Hypertension Mother   . Heart disease Mother   . Obesity Mother   . Heart attack Paternal Grandfather   . Heart disease Sister   . BRCA 1/2 Neg Hx   . Breast cancer Neg Hx     ROS: Review of Systems  Constitutional: Positive for weight loss.  Cardiovascular: Negative for chest pain.  Musculoskeletal: Negative for myalgias.   PHYSICAL EXAM: Blood pressure 107/61, pulse (!) 49, temperature 97.6 F (36.4  C), temperature source Oral, height 5' (1.524 m), weight 142 lb (64.4 kg), SpO2 98 %. Body mass index is 27.73 kg/m. Physical Exam Vitals signs reviewed.  Constitutional:      Appearance: Normal appearance. She is obese.  Cardiovascular:     Rate and Rhythm: Normal rate.  Pulmonary:     Effort: Pulmonary effort is normal.  Musculoskeletal: Normal range of motion.  Skin:    General: Skin is warm and dry.  Neurological:     Mental Status: She is alert and oriented to person, place, and time.  Psychiatric:        Mood and Affect: Mood normal.        Behavior: Behavior normal.    RECENT LABS AND TESTS: BMET    Component Value Date/Time   NA 143 10/14/2017 1923   NA 141 06/16/2017 0000   K 4.1 10/14/2017 1923   CL 105 10/14/2017 1923   CO2 27 10/14/2017 1923   GLUCOSE 92 10/14/2017 1923   BUN 26 (H) 10/14/2017 1923   BUN 22 06/16/2017 0000   CREATININE 0.98 10/14/2017 1923   CALCIUM 9.7 10/14/2017 1923   GFRNONAA 58 (L) 10/14/2017 1923   GFRAA >60 10/14/2017 1923   Lab Results  Component Value Date   HGBA1C 5.6 10/15/2017   HGBA1C 5.6 06/16/2017   Lab Results  Component Value Date   INSULIN 5.9 10/15/2017   INSULIN 6.4 06/16/2017   CBC    Component Value Date/Time   WBC 12.2 (H) 10/14/2017 1923   RBC 4.21 10/14/2017 1923   HGB 13.4 10/14/2017 1923   HCT 39.7 10/14/2017 1923   PLT 235 10/14/2017 1923   MCV 94.3 10/14/2017 1923   MCH 31.8 10/14/2017 1923   MCHC 33.8 10/14/2017 1923   RDW 12.9 10/14/2017 1923   LYMPHSABS 2.9 10/14/2017 1923   MONOABS 0.8 10/14/2017 1923   EOSABS 0.2 10/14/2017 1923   BASOSABS 0.0 10/14/2017 1923   Iron/TIBC/Ferritin/ %Sat    Component Value Date/Time   IRON 62 01/09/2015 0806   FERRITIN 26.6 01/09/2015 0806   IRONPCTSAT 15.2 (L) 01/09/2015 0806   Lipid  Panel     Component Value Date/Time   CHOL 139 10/15/2017 0802   TRIG 114 10/15/2017 0802   HDL 50 10/15/2017 0802   CHOLHDL 2.5 10/29/2016 1030   CHOLHDL 3  11/30/2015 1028   VLDL 25.4 11/30/2015 1028   LDLCALC 66 10/15/2017 0802   Hepatic Function Panel     Component Value Date/Time   PROT 7.1 06/16/2017 0000   ALBUMIN 4.7 06/16/2017 0000   AST 27 06/16/2017 0000   ALT 20 06/16/2017 0000   ALKPHOS 88 06/16/2017 0000   BILITOT 0.3 06/16/2017 0000   BILIDIR 0.07 10/29/2016 1030      Component Value Date/Time   TSH 0.931 06/16/2017 0000   TSH 1.43 01/09/2015 0806   TSH 0.876 08/26/2012 1722   Results for TARIYAH, PENDRY (MRN [id number]) as of 02/04/2018 11:28  Ref. Range 10/15/2017 08:02  Vitamin D, 25-Hydroxy Latest Ref Range: 30.0 - 100.0 ng/mL 57.7    OBESITY BEHAVIORAL INTERVENTION VISIT  Today's visit was # 12   Starting weight: 157 lbs Starting date: 06/16/17 Today's weight : Weight: 142 lb (64.4 kg)  Today's date: 02/03/2018 Total lbs lost to date: 15  ASK: We discussed the diagnosis of obesity with [name] today and [name] agreed to give [place] permission to discuss obesity behavioral modification therapy today.  ASSESS: Tanee has the diagnosis of obesity and her BMI today is 27.7. Sihaam is in the action stage of change.   ADVISE: Sevilla was educated on the multiple health risks of obesity as well as the benefit of weight loss to improve her health. She was advised of the need for long term treatment and the importance of lifestyle modifications to improve her current health and to decrease her risk of future health problems.  AGREE: Multiple dietary modification options and treatment options were discussed and Emillie agreed to follow the recommendations documented in the above note.  ARRANGE: Cailyn was educated on the importance of frequent visits to treat obesity as outlined per CMS and USPSTF guidelines and agreed to schedule her next follow up appointment today.  I, Marcille Blanco, am acting as transcriptionist for Starlyn Skeans, MD  I have reviewed the above documentation for accuracy and completeness, and I  agree with the above. -Dennard Nip, MD

## 2018-02-08 DIAGNOSIS — R519 Headache, unspecified: Secondary | ICD-10-CM | POA: Insufficient documentation

## 2018-02-08 DIAGNOSIS — G8929 Other chronic pain: Secondary | ICD-10-CM | POA: Insufficient documentation

## 2018-03-03 ENCOUNTER — Ambulatory Visit (INDEPENDENT_AMBULATORY_CARE_PROVIDER_SITE_OTHER): Payer: Medicare HMO | Admitting: Family Medicine

## 2018-03-03 ENCOUNTER — Encounter (INDEPENDENT_AMBULATORY_CARE_PROVIDER_SITE_OTHER): Payer: Self-pay | Admitting: Family Medicine

## 2018-03-03 VITALS — BP 93/51 | HR 50 | Temp 97.8°F | Ht 60.0 in | Wt 140.0 lb

## 2018-03-03 DIAGNOSIS — Z6830 Body mass index (BMI) 30.0-30.9, adult: Secondary | ICD-10-CM | POA: Diagnosis not present

## 2018-03-03 DIAGNOSIS — E559 Vitamin D deficiency, unspecified: Secondary | ICD-10-CM

## 2018-03-03 DIAGNOSIS — E7849 Other hyperlipidemia: Secondary | ICD-10-CM | POA: Diagnosis not present

## 2018-03-03 DIAGNOSIS — E669 Obesity, unspecified: Secondary | ICD-10-CM

## 2018-03-03 DIAGNOSIS — I10 Essential (primary) hypertension: Secondary | ICD-10-CM | POA: Diagnosis not present

## 2018-03-03 DIAGNOSIS — E8881 Metabolic syndrome: Secondary | ICD-10-CM | POA: Diagnosis not present

## 2018-03-03 MED ORDER — VITAMIN D (ERGOCALCIFEROL) 1.25 MG (50000 UNIT) PO CAPS: 50000.0000 [IU] | ORAL_CAPSULE | ORAL | 0 refills | Status: DC

## 2018-03-03 NOTE — Progress Notes (Signed)
Office: [phone]  /  Fax: [phone]   HPI:   Chief Complaint: OBESITY Courtney [name] is here to discuss her progress with her obesity treatment plan. She is on the Category 1 plan and is following her eating plan approximately 90 % of the time. She states she is walking 30 minutes 3 to 4 times per week. Courtney [name] has done well with losing weight even over the holidays. She is at a healthy weight, but needs to work on strength exercise.  Her weight is 140 lb (63.5 kg) today and has had a weight loss of 2 pounds over a period of 4 weeks since her last visit. She has lost 17 lbs since starting treatment with [place].  Hyperlipidemia Courtney [name] has hyperlipidemia and has been trying to improve her cholesterol levels with intensive lifestyle modification including a low saturated fat diet, exercise and weight loss. She is stable on Crestor and denies any chest pain or myalgias. She is due for labs today.  Insulin Resistance Courtney [name] has a diagnosis of insulin resistance based on her elevated fasting insulin level >5. Although Courtney [name]'s blood glucose readings are still under good control, insulin resistance puts her at greater risk of metabolic syndrome and diabetes. She is not taking metformin currently and is stable on her diet and exercise to decrease risk of diabetes. She is due for labs today. She denies hypoglycemia.  Hypertension Courtney [name] is a 68 y.o. female with hypertension. Courtney [name]'s blood pressure is currently well controlled on low dose ACE and Beta blocker. She is working on weight loss to help control her blood pressure with the goal of decreasing her risk of heart attack and stroke. Courtney [name] denies chest pain or lightheadedness.  Vitamin D deficiency Courtney [name] has a diagnosis of vitamin D deficiency. She is currently stable on vit D and her last labs were at goal. She denies nausea, vomiting, or muscle weakness. Courtney [name] is due to recheck labs today.  ASSESSMENT AND PLAN:  Other hyperlipidemia - Plan: CBC  With Differential, Lipid Panel With LDL/HDL Ratio  Insulin resistance - Plan: Comprehensive metabolic panel, Hemoglobin A1c, Insulin, random  Vitamin D deficiency - Plan: VITAMIN D 25 Hydroxy (Vit-D Deficiency, Fractures), Vitamin D, Ergocalciferol, (DRISDOL) 1.25 MG (50000 UT) CAPS capsule  Essential hypertension  Class 1 obesity with serious comorbidity and body mass index (BMI) of 30.0 to 30.9 in adult, unspecified obesity type - Starting BMI greater then 30   PLAN:  Hyperlipidemia Courtney [name] was informed of the American Heart Association Guidelines emphasizing intensive lifestyle modifications as the first line treatment for hyperlipidemia. We discussed many lifestyle modifications today in depth, and Courtney [name] will continue to work on decreasing saturated fats such as fatty red meat, butter and many fried foods. She will also increase vegetables and lean protein in her diet and continue to work on exercise and weight loss efforts. We will order labs today. Courtney [name] to continue her diet and exercise and will follow up in 3 weeks.  Insulin Resistance Courtney [name] will continue to work on weight loss, exercise, and decreasing simple carbohydrates in her diet to help decrease the risk of diabetes. She was informed that eating too many simple carbohydrates or too many calories at one sitting increases the likelihood of GI side effects. We will draw labs today and she [name] to continue her diet and exercise. Courtney [name] agreed to follow up with [place] as directed to monitor her progress.  Hypertension We discussed sodium restriction, working on healthy weight loss, and a regular exercise  program as the means to achieve improved blood pressure control. We will continue to monitor her blood pressure as well as her progress with the above lifestyle modifications. She will continue her medications as prescribed and will watch for signs of hypotension as she continues her lifestyle modifications. We will order labs today.  Courtney [name] agreed with this plan and agreed to follow up as directed.  Vitamin D Deficiency Courtney [name] was informed that low vitamin D levels contributes to fatigue and are associated with obesity, breast, and colon cancer. She [name] to continue to take prescription Vit D '@50'$ ,000 IU every week #4 with no refills and will follow up for routine testing of vitamin D, at least 2-3 times per year. She was informed of the risk of over-replacement of vitamin D and [name] to not increase her dose unless she discusses this with [place] first. We will check labs today and she [name] to follow up in 3 weeks.  Obesity Courtney [name] is currently in the action stage of change. As such, her goal is to continue with weight loss efforts. She has agreed to follow the Category 1 plan. Courtney [name] has been instructed to work up to a goal of 150 minutes of combined cardio and strengthening exercise per week or start wall push ups for upper body strengthening for weight loss and overall health benefits. We discussed the following Behavioral Modification Strategies today: increasing lean protein intake, decreasing simple carbohydrates, and work on meal planning and easy cooking plans.  Courtney [name] has agreed to follow up with our clinic in 3 weeks. She was informed of the importance of frequent follow up visits to maximize her success with intensive lifestyle modifications for her multiple health conditions.  ALLERGIES: Allergies  Allergen Reactions  . Erythromycin Hives  . Penicillins Hives    Has patient had a PCN reaction causing immediate rash, facial/tongue/throat swelling, SOB or lightheadedness with hypotension: No Has patient had a PCN reaction causing severe rash involving mucus membranes or skin necrosis: No Has patient had a PCN reaction that required hospitalization: No Has patient had a PCN reaction occurring within the last 10 years: No If all of the above answers are "NO", then may proceed with Cephalosporin use.   Courtney [name] Courtney [name]  Allergy Hives    MEDICATIONS: Current Outpatient Medications on File Prior to Visit  Medication Sig Dispense Refill  . acetaminophen (TYLENOL) 500 MG tablet Take 1,000 mg by mouth every 6 (six) hours as needed (Pain).     . Alpha-D-Galactosidase (BEANO) TABS Take 1 tablet by mouth daily.    Courtney [name] Courtney aspirin 81 MG tablet Take 81 mg by mouth daily.    . diclofenac sodium (VOLTAREN) 1 % GEL Apply 2 g topically 4 (four) times daily. Rub into affected area of foot 2 to 4 times daily 100 g 2  . Docusate Calcium (STOOL SOFTENER PO) Take 1 tablet by mouth daily as needed for constipation.    Courtney [name] Courtney ezetimibe (ZETIA) 10 MG tablet TAKE 1 TABLET (10 MG TOTAL) BY MOUTH DAILY. 90 tablet 3  . metoprolol tartrate (LOPRESSOR) 25 MG tablet TAKE 1/2 TABLET TWICE DAILY 90 tablet 3  . MIRALAX powder Take 17 g by mouth daily as needed for constipation.    . nitroGLYCERIN (NITROSTAT) 0.4 MG SL tablet Place 1 tablet (0.4 mg total) under the tongue every 5 (five) minutes as needed for chest pain. 25 tablet 3  . omeprazole (PRILOSEC) 40 MG capsule Take 1 capsule (40 mg total) by mouth daily. 90 capsule   .  ramipril (ALTACE) 5 MG capsule TAKE 1 CAPSULE EVERY DAY 90 capsule 3  . rosuvastatin (CRESTOR) 40 MG tablet TAKE 1 TABLET EVERY DAY 90 tablet 2   No current facility-administered medications on file prior to visit.     PAST MEDICAL HISTORY: Past Medical History:  Diagnosis Date  . Aortic valve sclerosis    echo 29/7989, soft systolic murmur  . 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 Back pain   . Carotid artery disease ([place])    40-59% bilateral, doppler December 2010  . Constipation   . Coronary artery disease    a. BMS-mid RCA 11/2007 b. stress echo 02/2009 subtle inferior HK, lateral ST depressions with stress c. follow-up cath 02/2009: RCA stent patent, severe but stable stenosis of distal posterior lateral branch RCA. LV normal, med tx unless more sx c. ETT Myoview 08/27/12: negative for ischemia; EF 64%  . Decreased hearing    . Depression    Mild, August, 2012  . Dyslipidemia   . Easy bruising   . Ejection fraction    EF normal, catheterization, 2011 /  EF 55%, echo, 2010  . Fluid overload    Mild, stable since hospitalization in the past  . GERD (gastroesophageal reflux disease)    occarionally, takes Pepcid over the counter  . Heart murmur    has, occasional PVC's  . History of right hip replacement   . Hyperlipidemia   . Hypertension   . Joint pain   . Myocardial infarction ([place])    2009  . Palpitations   . PVC (premature ventricular contraction)    per prior Holter monitor  . Right hip pain 12/2009  . [name] allergy   . Trouble in sleeping   . Vitamin B12 deficiency   . Vitamin D deficiency     PAST SURGICAL HISTORY: Past Surgical History:  Procedure Laterality Date  . 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/01/2005  . carcinoid tumor removal    . CARDIAC CATHETERIZATION  1/211   Patent RCA stent, stenotic PLB supplying small distribution; medically managed  . CORONARY ANGIOPLASTY WITH STENT PLACEMENT  11/2007   BMS-mid RCA  . DIAGNOSTIC LAPAROSCOPY     1982 for endometriosis  . DILATION AND CURETTAGE OF UTERUS  2005  . hx of wisdom teeth surgery    . SKIN CANCER EXCISION    . TOTAL HIP ARTHROPLASTY Right 07/08/2014   Procedure: RIGHT TOTAL HIP ARTHROPLASTY ANTERIOR APPROACH;  Surgeon: Mcarthur Rossetti, MD;  Location: [facility];  Service: Orthopedics;  Laterality: Right;    SOCIAL HISTORY: Social History   Tobacco Use  . Smoking status: Never Smoker  . Smokeless tobacco: Never Used  Substance Use Topics  . Alcohol use: Yes    Alcohol/week: 7.0 standard drinks    Types: 7 Glasses of wine per week    Comment: 1 glass of wine/night  . Drug use: No    FAMILY HISTORY: Family History  Problem Relation Age of Onset  . Heart attack Father 1  . Hyperlipidemia Father   . Hypertension Father   . Sudden death Father   . Stroke Mother   . Hyperlipidemia Mother   . Hypertension Mother     . Heart disease Mother   . Obesity Mother   . Heart attack Paternal Grandfather   . Heart disease Sister   . BRCA 1/2 Neg Hx   . Breast cancer Neg Hx     ROS: Review of Systems  Constitutional: Positive for weight loss.  Cardiovascular: Negative for chest pain.  Gastrointestinal: Negative for nausea and vomiting.  Musculoskeletal: Negative for myalgias.       Negative for muscle weakness.  Neurological:       Negative for lightheadedness.  Endo/Heme/Allergies:       Negative for hypoglycemia.    PHYSICAL EXAM: Blood pressure (!) 93/51, pulse (!) 50, temperature 97.8 F (36.6 C), temperature source Oral, height 5' (1.524 m), weight 140 lb (63.5 kg), SpO2 100 %. Body mass index is 27.34 kg/m. Physical Exam Vitals signs reviewed.  Constitutional:      Appearance: Normal appearance. She is obese.  Cardiovascular:     Rate and Rhythm: Normal rate.  Pulmonary:     Effort: Pulmonary effort is normal.  Musculoskeletal: Normal range of motion.  Skin:    General: Skin is warm and dry.  Neurological:     Mental Status: She is alert and oriented to person, place, and time.  Psychiatric:        Mood and Affect: Mood normal.        Behavior: Behavior normal.     RECENT LABS AND TESTS: BMET    Component Value Date/Time   NA 143 10/14/2017 1923   NA 141 06/16/2017 0000   K 4.1 10/14/2017 1923   CL 105 10/14/2017 1923   CO2 27 10/14/2017 1923   GLUCOSE 92 10/14/2017 1923   BUN 26 (H) 10/14/2017 1923   BUN 22 06/16/2017 0000   CREATININE 0.98 10/14/2017 1923   CALCIUM 9.7 10/14/2017 1923   GFRNONAA 58 (L) 10/14/2017 1923   GFRAA >60 10/14/2017 1923   Lab Results  Component Value Date   HGBA1C 5.6 10/15/2017   HGBA1C 5.6 06/16/2017   Lab Results  Component Value Date   INSULIN 5.9 10/15/2017   INSULIN 6.4 06/16/2017   CBC    Component Value Date/Time   WBC 12.2 (H) 10/14/2017 1923   RBC 4.21 10/14/2017 1923   HGB 13.4 10/14/2017 1923   HCT 39.7 10/14/2017  1923   PLT 235 10/14/2017 1923   MCV 94.3 10/14/2017 1923   MCH 31.8 10/14/2017 1923   MCHC 33.8 10/14/2017 1923   RDW 12.9 10/14/2017 1923   LYMPHSABS 2.9 10/14/2017 1923   MONOABS 0.8 10/14/2017 1923   EOSABS 0.2 10/14/2017 1923   BASOSABS 0.0 10/14/2017 1923   Iron/TIBC/Ferritin/ %Sat    Component Value Date/Time   IRON 62 01/09/2015 0806   FERRITIN 26.6 01/09/2015 0806   IRONPCTSAT 15.2 (L) 01/09/2015 0806   Lipid Panel     Component Value Date/Time   CHOL 139 10/15/2017 0802   TRIG 114 10/15/2017 0802   HDL 50 10/15/2017 0802   CHOLHDL 2.5 10/29/2016 1030   CHOLHDL 3 11/30/2015 1028   VLDL 25.4 11/30/2015 1028   LDLCALC 66 10/15/2017 0802   Hepatic Function Panel     Component Value Date/Time   PROT 7.1 06/16/2017 0000   ALBUMIN 4.7 06/16/2017 0000   AST 27 06/16/2017 0000   ALT 20 06/16/2017 0000   ALKPHOS 88 06/16/2017 0000   BILITOT 0.3 06/16/2017 0000   BILIDIR 0.07 10/29/2016 1030      Component Value Date/Time   TSH 0.931 06/16/2017 0000   TSH 1.43 01/09/2015 0806   TSH 0.876 08/26/2012 1722   Results for PABLO, MATHURIN (MRN [id number]) as of 03/03/2018 15:29  Ref. Range 10/15/2017 08:02  Vitamin D, 25-Hydroxy Latest Ref Range: 30.0 - 100.0 ng/mL 57.7    OBESITY BEHAVIORAL INTERVENTION VISIT  Today's visit was # 13   Starting weight: 157 lbs Starting date: 06/16/17 Today's weight : Weight: 140 lb (63.5 kg)  Today's date: 03/03/2018 Total lbs lost to date: 17 At least 15 minutes were spent on discussing the following behavioral intervention visit.  ASK: We discussed the diagnosis of obesity with Courtney [name] today and Courtney [name] agreed to give [place] permission to discuss obesity behavioral modification therapy today.  ASSESS: Courtney [name] has the diagnosis of obesity and her BMI today is 27.3. Nyree is in the action stage of change.   ADVISE: Courtney [name] was educated on the multiple health risks of obesity as well as the benefit of weight loss to improve her  health. She was advised of the need for long term treatment and the importance of lifestyle modifications to improve her current health and to decrease her risk of future health problems.  AGREE: Multiple dietary modification options and treatment options were discussed and Jazalyn agreed to follow the recommendations documented in the above note.  ARRANGE: Courtney [name] was educated on the importance of frequent visits to treat obesity as outlined per CMS and USPSTF guidelines and agreed to schedule her next follow up appointment today.  I, Marcille Blanco, am acting as transcriptionist for Starlyn Skeans, MD  I have reviewed the above documentation for accuracy and completeness, and I agree with the above. -Dennard Nip, MD

## 2018-03-04 DIAGNOSIS — M5136 Other intervertebral disc degeneration, lumbar region: Secondary | ICD-10-CM | POA: Diagnosis not present

## 2018-03-04 DIAGNOSIS — M25551 Pain in right hip: Secondary | ICD-10-CM | POA: Diagnosis not present

## 2018-03-04 LAB — LIPID PANEL WITH LDL/HDL RATIO
Cholesterol, Total: 159 mg/dL (ref 100–199)
HDL: 69 mg/dL (ref >39)
LDL Calculated: 69 mg/dL (ref 0–99)
LDL/HDL RATIO: 1 ratio (ref 0.0–3.2)
Triglycerides: 105 mg/dL (ref 0–149)
VLDL Cholesterol Cal: 21 mg/dL (ref 5–40)

## 2018-03-04 LAB — INSULIN, RANDOM: INSULIN: 5 u[IU]/mL (ref 2.6–24.9)

## 2018-03-04 LAB — COMPREHENSIVE METABOLIC PANEL
A/G RATIO: 2 (ref 1.2–2.2)
ALBUMIN: 4.5 g/dL (ref 3.6–4.8)
ALT: 18 IU/L (ref 0–32)
AST: 22 IU/L (ref 0–40)
Alkaline Phosphatase: 75 IU/L (ref 39–117)
BILIRUBIN TOTAL: 0.3 mg/dL (ref 0.0–1.2)
BUN / CREAT RATIO: 26 (ref 12–28)
BUN: 22 mg/dL (ref 8–27)
CHLORIDE: 103 mmol/L (ref 96–106)
CO2: 23 mmol/L (ref 20–29)
Calcium: 9.7 mg/dL (ref 8.7–10.3)
Creatinine, Ser: 0.85 mg/dL (ref 0.57–1.00)
GFR calc non Af Amer: 70 mL/min/{1.73_m2} (ref >59)
GFR, EST AFRICAN AMERICAN: 81 mL/min/{1.73_m2} (ref >59)
Globulin, Total: 2.2 g/dL (ref 1.5–4.5)
Glucose: 89 mg/dL (ref 65–99)
POTASSIUM: 4.4 mmol/L (ref 3.5–5.2)
SODIUM: 143 mmol/L (ref 134–144)
TOTAL PROTEIN: 6.7 g/dL (ref 6.0–8.5)

## 2018-03-04 LAB — CBC WITH DIFFERENTIAL
BASOS ABS: 0.1 10*3/uL (ref 0.0–0.2)
Basos: 1 %
EOS (ABSOLUTE): 0.4 10*3/uL (ref 0.0–0.4)
Eos: 6 %
HEMATOCRIT: 39.3 % (ref 34.0–46.6)
Hemoglobin: 13.2 g/dL (ref 11.1–15.9)
IMMATURE GRANULOCYTES: 0 %
Immature Grans (Abs): 0 10*3/uL (ref 0.0–0.1)
LYMPHS ABS: 3 10*3/uL (ref 0.7–3.1)
Lymphs: 41 %
MCH: 31.4 pg (ref 26.6–33.0)
MCHC: 33.6 g/dL (ref 31.5–35.7)
MCV: 94 fL (ref 79–97)
MONOS ABS: 0.4 10*3/uL (ref 0.1–0.9)
Monocytes: 5 %
Neutrophils Absolute: 3.5 10*3/uL (ref 1.4–7.0)
Neutrophils: 47 %
RBC: 4.2 x10E6/uL (ref 3.77–5.28)
RDW: 11.9 % (ref 11.7–15.4)
WBC: 7.4 10*3/uL (ref 3.4–10.8)

## 2018-03-04 LAB — VITAMIN D 25 HYDROXY (VIT D DEFICIENCY, FRACTURES): VIT D 25 HYDROXY: 54.6 ng/mL (ref 30.0–100.0)

## 2018-03-04 LAB — HEMOGLOBIN A1C
ESTIMATED AVERAGE GLUCOSE: 114 mg/dL
HEMOGLOBIN A1C: 5.6 % (ref 4.8–5.6)

## 2018-03-05 DIAGNOSIS — H11823 Conjunctivochalasis, bilateral: Secondary | ICD-10-CM | POA: Diagnosis not present

## 2018-03-05 DIAGNOSIS — H524 Presbyopia: Secondary | ICD-10-CM | POA: Diagnosis not present

## 2018-03-05 DIAGNOSIS — H04211 Epiphora due to excess lacrimation, right lacrimal gland: Secondary | ICD-10-CM | POA: Diagnosis not present

## 2018-03-05 DIAGNOSIS — H5203 Hypermetropia, bilateral: Secondary | ICD-10-CM | POA: Diagnosis not present

## 2018-03-05 DIAGNOSIS — H52223 Regular astigmatism, bilateral: Secondary | ICD-10-CM | POA: Diagnosis not present

## 2018-03-23 ENCOUNTER — Ambulatory Visit (INDEPENDENT_AMBULATORY_CARE_PROVIDER_SITE_OTHER): Payer: Medicare HMO | Admitting: Family Medicine

## 2018-03-23 ENCOUNTER — Encounter (INDEPENDENT_AMBULATORY_CARE_PROVIDER_SITE_OTHER): Payer: Self-pay | Admitting: Family Medicine

## 2018-03-23 VITALS — BP 95/50 | HR 50 | Ht 60.0 in | Wt 146.0 lb

## 2018-03-23 DIAGNOSIS — Z6830 Body mass index (BMI) 30.0-30.9, adult: Secondary | ICD-10-CM | POA: Diagnosis not present

## 2018-03-23 DIAGNOSIS — E669 Obesity, unspecified: Secondary | ICD-10-CM

## 2018-03-23 DIAGNOSIS — G4709 Other insomnia: Secondary | ICD-10-CM | POA: Diagnosis not present

## 2018-03-24 NOTE — Progress Notes (Signed)
Office: [phone]  /  Fax: [phone]   HPI:   Chief Complaint: OBESITY Courtney [name] is here to discuss her progress with her obesity treatment plan. She is on the Category 1 plan and is following her eating plan approximately 85 % of the time. She states she is walking 30 to 35 minutes 2 to 3 times per week. Courtney [name] notes an increase in evening snacking on simple carbs. She also notes that she is eating more higher fat protein.  Her weight is 146 lb (66.2 kg) today and has had a weight gain of 6 pounds over a period of 3 weeks since her last visit. She has lost 11 lbs since starting treatment with [place].  Insomnia Courtney [name] notes getting poor quality sleep that has worsened over 2 years. She has not had a sleep study of evaluation. She has been more active this week and this has improved her sleep quality.  ASSESSMENT AND PLAN:  Other insomnia  Class 1 obesity with serious comorbidity and body mass index (BMI) of 30.0 to 30.9 in adult, unspecified obesity type - BMI greater than 30 at start of program  PLAN:  Insomnia The problem of recurrent insomnia was discussed. Avoidance of caffeine sources was strongly encouraged and sleep hygiene techniques were discussed. Janesia agrees to continue her diet and exercise. She will follow up as agreed in 3 weeks.  I spent > than 50% of the 25 minute visit on counseling as documented in the note.  Obesity Courtney [name] is currently in the action stage of change. As such, her goal is to continue with weight loss efforts. She has agreed to follow the Category 1 plan. Courtney [name] has been instructed to work up to a goal of 150 minutes of combined cardio and strengthening exercise per week for weight loss and overall health benefits. We discussed the following Behavioral Modification Strategies today: increasing lean protein intake, decreasing simple carbohydrates, keeping healthy foods in the home, and work on meal planning and easy cooking plans.  Courtney [name] has agreed to  follow up with our clinic in 3 weeks. She was informed of the importance of frequent follow up visits to maximize her success with intensive lifestyle modifications for her multiple health conditions.  ALLERGIES: Allergies  Allergen Reactions  . Erythromycin Hives  . Penicillins Hives    Has patient had a PCN reaction causing immediate rash, facial/tongue/throat swelling, SOB or lightheadedness with hypotension: No Has patient had a PCN reaction causing severe rash involving mucus membranes or skin necrosis: No Has patient had a PCN reaction that required hospitalization: No Has patient had a PCN reaction occurring within the last 10 years: No If all of the above answers are "NO", then may proceed with Cephalosporin use.   [name] Shellfish Allergy Hives    MEDICATIONS: Current Outpatient Medications on File Prior to Visit  Medication Sig Dispense Refill  . acetaminophen (TYLENOL) 500 MG tablet Take 1,000 mg by mouth every 6 (six) hours as needed (Pain).     . Alpha-D-Galactosidase (BEANO) TABS Take 1 tablet by mouth daily.    [name] aspirin 81 MG tablet Take 81 mg by mouth daily.    . diclofenac sodium (VOLTAREN) 1 % GEL Apply 2 g topically 4 (four) times daily. Rub into affected area of foot 2 to 4 times daily 100 g 2  . Docusate Calcium (STOOL SOFTENER PO) Take 1 tablet by mouth daily as needed for constipation.    [name] ezetimibe (ZETIA) 10 MG tablet TAKE 1 TABLET (10  MG TOTAL) BY MOUTH DAILY. 90 tablet 3  . metoprolol tartrate (LOPRESSOR) 25 MG tablet TAKE 1/2 TABLET TWICE DAILY 90 tablet 3  . MIRALAX powder Take 17 g by mouth daily as needed for constipation.    . nitroGLYCERIN (NITROSTAT) 0.4 MG SL tablet Place 1 tablet (0.4 mg total) under the tongue every 5 (five) minutes as needed for chest pain. 25 tablet 3  . omeprazole (PRILOSEC) 40 MG capsule Take 1 capsule (40 mg total) by mouth daily. 90 capsule   . ramipril (ALTACE) 5 MG capsule TAKE 1 CAPSULE EVERY DAY 90 capsule 3  . rosuvastatin  (CRESTOR) 40 MG tablet TAKE 1 TABLET EVERY DAY 90 tablet 2  . Vitamin D, Ergocalciferol, (DRISDOL) 1.25 MG (50000 UT) CAPS capsule Take 1 capsule (50,000 Units total) by mouth every 7 (seven) days. 4 capsule 0   No current facility-administered medications on file prior to visit.     PAST MEDICAL HISTORY: Past Medical History:  Diagnosis Date  . Aortic valve sclerosis    echo 98/3382, soft systolic murmur  . 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 Back pain   . Carotid artery disease ([place])    40-59% bilateral, doppler December 2010  . Constipation   . Coronary artery disease    a. BMS-mid RCA 11/2007 b. stress echo 02/2009 subtle inferior HK, lateral ST depressions with stress c. follow-up cath 02/2009: RCA stent patent, severe but stable stenosis of distal posterior lateral branch RCA. LV normal, med tx unless more sx c. ETT Myoview 08/27/12: negative for ischemia; EF 64%  . Decreased hearing   . Depression    Mild, August, 2012  . Dyslipidemia   . Easy bruising   . Ejection fraction    EF normal, catheterization, 2011 /  EF 55%, echo, 2010  . Fluid overload    Mild, stable since hospitalization in the past  . GERD (gastroesophageal reflux disease)    occarionally, takes Pepcid over the counter  . Heart murmur    has, occasional PVC's  . History of right hip replacement   . Hyperlipidemia   . Hypertension   . Joint pain   . Myocardial infarction ([place])    2009  . Palpitations   . PVC (premature ventricular contraction)    per prior Holter monitor  . Right hip pain 12/2009  . Shellfish allergy   . Trouble in sleeping   . Vitamin B12 deficiency   . Vitamin D deficiency     PAST SURGICAL HISTORY: Past Surgical History:  Procedure Laterality Date  . 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/01/2005  . carcinoid tumor removal    . CARDIAC CATHETERIZATION  1/211   Patent RCA stent, stenotic PLB supplying small distribution; medically managed  . CORONARY ANGIOPLASTY WITH STENT PLACEMENT  11/2007    BMS-mid RCA  . DIAGNOSTIC LAPAROSCOPY     1982 for endometriosis  . DILATION AND CURETTAGE OF UTERUS  2005  . hx of wisdom teeth surgery    . SKIN CANCER EXCISION    . TOTAL HIP ARTHROPLASTY Right 07/08/2014   Procedure: RIGHT TOTAL HIP ARTHROPLASTY ANTERIOR APPROACH;  Surgeon: Mcarthur Rossetti, MD;  Location: [facility];  Service: Orthopedics;  Laterality: Right;    SOCIAL HISTORY: Social History   Tobacco Use  . Smoking status: Never Smoker  . Smokeless tobacco: Never Used  Substance Use Topics  . Alcohol use: Yes    Alcohol/week: 7.0 standard drinks    Types: 7 Glasses of wine  per week    Comment: 1 glass of wine/night  . Drug use: No    FAMILY HISTORY: Family History  Problem Relation Age of Onset  . Heart attack Father 62  . Hyperlipidemia Father   . Hypertension Father   . Sudden death Father   . Stroke Mother   . Hyperlipidemia Mother   . Hypertension Mother   . Heart disease Mother   . Obesity Mother   . Heart attack Paternal Grandfather   . Heart disease Sister   . BRCA 1/2 Neg Hx   . Breast cancer Neg Hx     ROS: Review of Systems  Constitutional: Negative for weight loss.  Psychiatric/Behavioral: The patient has insomnia.    PHYSICAL EXAM: Blood pressure (!) 95/50, pulse (!) 50, height 5' (1.524 m), weight 146 lb (66.2 kg), SpO2 99 %. Body mass index is 28.51 kg/m. Physical Exam Vitals signs reviewed.  Constitutional:      Appearance: Normal appearance. She is obese.  Cardiovascular:     Rate and Rhythm: Normal rate.  Pulmonary:     Effort: Pulmonary effort is normal.  Musculoskeletal: Normal range of motion.  Skin:    General: Skin is warm and dry.  Neurological:     Mental Status: She is alert and oriented to person, place, and time.  Psychiatric:        Mood and Affect: Mood normal.        Behavior: Behavior normal.    RECENT LABS AND TESTS: BMET    Component Value Date/Time   NA 143 03/03/2018 1116   K 4.4 03/03/2018 1116   CL  103 03/03/2018 1116   CO2 23 03/03/2018 1116   GLUCOSE 89 03/03/2018 1116   GLUCOSE 92 10/14/2017 1923   BUN 22 03/03/2018 1116   CREATININE 0.85 03/03/2018 1116   CALCIUM 9.7 03/03/2018 1116   GFRNONAA 70 03/03/2018 1116   GFRAA 81 03/03/2018 1116   Lab Results  Component Value Date   HGBA1C 5.6 03/03/2018   HGBA1C 5.6 10/15/2017   HGBA1C 5.6 06/16/2017   Lab Results  Component Value Date   INSULIN 5.0 03/03/2018   INSULIN 5.9 10/15/2017   INSULIN 6.4 06/16/2017   CBC    Component Value Date/Time   WBC 7.4 03/03/2018 1116   WBC 12.2 (H) 10/14/2017 1923   RBC 4.20 03/03/2018 1116   RBC 4.21 10/14/2017 1923   HGB 13.2 03/03/2018 1116   HCT 39.3 03/03/2018 1116   PLT 235 10/14/2017 1923   MCV 94 03/03/2018 1116   MCH 31.4 03/03/2018 1116   MCH 31.8 10/14/2017 1923   MCHC 33.6 03/03/2018 1116   MCHC 33.8 10/14/2017 1923   RDW 11.9 03/03/2018 1116   LYMPHSABS 3.0 03/03/2018 1116   MONOABS 0.8 10/14/2017 1923   EOSABS 0.4 03/03/2018 1116   BASOSABS 0.1 03/03/2018 1116   Iron/TIBC/Ferritin/ %Sat    Component Value Date/Time   IRON 62 01/09/2015 0806   FERRITIN 26.6 01/09/2015 0806   IRONPCTSAT 15.2 (L) 01/09/2015 0806   Lipid Panel     Component Value Date/Time   CHOL 159 03/03/2018 1116   TRIG 105 03/03/2018 1116   HDL 69 03/03/2018 1116   CHOLHDL 2.5 10/29/2016 1030   CHOLHDL 3 11/30/2015 1028   VLDL 25.4 11/30/2015 1028   LDLCALC 69 03/03/2018 1116   Hepatic Function Panel     Component Value Date/Time   PROT 6.7 03/03/2018 1116   ALBUMIN 4.5 03/03/2018 1116   AST 22  03/03/2018 1116   ALT 18 03/03/2018 1116   ALKPHOS 75 03/03/2018 1116   BILITOT 0.3 03/03/2018 1116   BILIDIR 0.07 10/29/2016 1030      Component Value Date/Time   TSH 0.931 06/16/2017 0000   TSH 1.43 01/09/2015 0806   TSH 0.876 08/26/2012 1722   Results for RASHEEN, SCHEWE (MRN [id number]) as of 03/24/2018 09:44  Ref. Range 03/03/2018 11:16  Vitamin D, 25-Hydroxy Latest Ref Range:  30.0 - 100.0 ng/mL 54.6    OBESITY BEHAVIORAL INTERVENTION VISIT  Today's visit was # 14   Starting weight: 157 lbs Starting date: 06/16/17 Today's weight : Weight: 146 lb (66.2 kg)  Today's date: 03/23/2018 Total lbs lost to date: 11  ASK: We discussed the diagnosis of obesity with Courtney [name] today and Courtney [name] agreed to give [place] permission to discuss obesity behavioral modification therapy today.  ASSESS: Courtney [name] has the diagnosis of obesity and her BMI today is 28.5. Courtney [name] is in the action stage of change.   ADVISE: Courtney [name] was educated on the multiple health risks of obesity as well as the benefit of weight loss to improve her health. She was advised of the need for long term treatment and the importance of lifestyle modifications to improve her current health and to decrease her risk of future health problems.  AGREE: Multiple dietary modification options and treatment options were discussed and Courtney [name] agreed to follow the recommendations documented in the above note.  ARRANGE: Courtney [name] was educated on the importance of frequent visits to treat obesity as outlined per CMS and USPSTF guidelines and agreed to schedule her next follow up appointment today.  Courtney [name], CMA am acting as transcriptionist for Starlyn Skeans  I have reviewed the above documentation for accuracy and completeness, and I agree with the above. -Dennard Nip, MD

## 2018-03-28 ENCOUNTER — Other Ambulatory Visit: Payer: Self-pay | Admitting: Cardiology

## 2018-04-16 ENCOUNTER — Ambulatory Visit
Admission: RE | Admit: 2018-04-16 | Discharge: 2018-04-16 | Disposition: A | Discharge status: Home / Self Care | Payer: Medicare HMO | Source: Ambulatory Visit | Attending: Obstetrics and Gynecology | Admitting: Obstetrics and Gynecology

## 2018-04-16 ENCOUNTER — Ambulatory Visit (INDEPENDENT_AMBULATORY_CARE_PROVIDER_SITE_OTHER): Payer: Medicare HMO | Admitting: Family Medicine

## 2018-04-16 ENCOUNTER — Other Ambulatory Visit: Payer: Self-pay | Admitting: Obstetrics and Gynecology

## 2018-04-16 ENCOUNTER — Encounter (INDEPENDENT_AMBULATORY_CARE_PROVIDER_SITE_OTHER): Payer: Self-pay | Admitting: Family Medicine

## 2018-04-16 VITALS — BP 117/65 | HR 51 | Ht 60.0 in | Wt 145.0 lb

## 2018-04-16 DIAGNOSIS — Z6830 Body mass index (BMI) 30.0-30.9, adult: Secondary | ICD-10-CM | POA: Diagnosis not present

## 2018-04-16 DIAGNOSIS — E669 Obesity, unspecified: Secondary | ICD-10-CM

## 2018-04-16 DIAGNOSIS — N6001 Solitary cyst of right breast: Secondary | ICD-10-CM

## 2018-04-16 DIAGNOSIS — E559 Vitamin D deficiency, unspecified: Secondary | ICD-10-CM

## 2018-04-16 DIAGNOSIS — R922 Inconclusive mammogram: Secondary | ICD-10-CM | POA: Diagnosis not present

## 2018-04-16 MED ORDER — VITAMIN D (ERGOCALCIFEROL) 1.25 MG (50000 UNIT) PO CAPS: 50000.0000 [IU] | ORAL_CAPSULE | ORAL | 0 refills | Status: DC

## 2018-04-16 NOTE — Progress Notes (Signed)
Office: [phone]  /  Fax: [phone]   HPI:   Chief Complaint: OBESITY Courtney [name] is here to discuss her progress with her obesity treatment plan. She is on the Category 1 plan and is following her eating plan approximately 90 % of the time. She states she is walking for 30 minutes 3 times per week. Courtney [name] continues to lose weight on her Category 1 plan slowly. Her BMI is now at 28 but her visceral fat is at 11 (goal is 8 or below).  Her weight is 145 lb (65.8 kg) today and has had a weight loss of 1 pound over a period of 3 to 4 weeks since her last visit. She has lost 12 lbs since starting treatment with [place].  Vitamin D Deficiency Courtney [name] has a diagnosis of vitamin D deficiency. She is stable on prescription Vit D, but level is not yet at goal. She denies nausea, vomiting or muscle weakness.  ASSESSMENT AND PLAN:  Vitamin D deficiency - Plan: Vitamin D, Ergocalciferol, (DRISDOL) 1.25 MG (50000 UT) CAPS capsule  Class 1 obesity with serious comorbidity and body mass index (BMI) of 30.0 to 30.9 in adult, unspecified obesity type - Starting BMI greater then 30  PLAN:  Vitamin D Deficiency Courtney [name] was informed that low vitamin D levels contributes to fatigue and are associated with obesity, breast, and colon cancer. Courtney [name] agrees to continue taking prescription Vit D _0 ,000 IU every week #4 and we will refill for 1 month. She will follow up for routine testing of vitamin D, at least 2-3 times per year. She was informed of the risk of over-replacement of vitamin D and agrees to not increase her dose unless she discusses this with [place] first. Courtney [name] agrees to follow up with our clinic in 3 to 4 weeks.  Obesity Courtney [name] is currently in the action stage of change. As such, her goal is to continue with weight loss efforts She has agreed to keep a food journal with 300-450 calories and 25 grams of protein at lunch daily and follow the Category 1 plan Courtney [name] has been instructed to work up to a goal of 150  minutes of combined cardio and strengthening exercise per week or she was advised to work on strategies by increasing incline on treadmill to 3 and see how she is doing for weight loss and overall health benefits. We discussed the following Behavioral Modification Strategies today: increasing lean protein intake, decreasing simple carbohydrates  and work on meal planning and easy cooking plans   Courtney [name] has agreed to follow up with our clinic in 3 to 4 weeks. She was informed of the importance of frequent follow up visits to maximize her success with intensive lifestyle modifications for her multiple health conditions.  ALLERGIES: Allergies  Allergen Reactions  . Erythromycin Hives  . Penicillins Hives    Has patient had a PCN reaction causing immediate rash, facial/tongue/throat swelling, SOB or lightheadedness with hypotension: No Has patient had a PCN reaction causing severe rash involving mucus membranes or skin necrosis: No Has patient had a PCN reaction that required hospitalization: No Has patient had a PCN reaction occurring within the last 10 years: No If all of the above answers are "NO", then may proceed with Cephalosporin use.   [name] Shellfish Allergy Hives    MEDICATIONS: Current Outpatient Medications on File Prior to Visit  Medication Sig Dispense Refill  . acetaminophen (TYLENOL) 500 MG tablet Take 1,000 mg by mouth every 6 (six) hours as needed (Pain).     [name]  Alpha-D-Galactosidase (BEANO) TABS Take 1 tablet by mouth daily.    . aspirin 81 MG tablet Take 81 mg by mouth daily.    . diclofenac sodium (VOLTAREN) 1 % GEL Apply 2 g topically 4 (four) times daily. Rub into affected area of foot 2 to 4 times daily 100 g 2  . Docusate Calcium (STOOL SOFTENER PO) Take 1 tablet by mouth daily as needed for constipation.    . ezetimibe (ZETIA) 10 MG tablet TAKE 1 TABLET (10 MG TOTAL) BY MOUTH DAILY. 90 tablet 3  . metoprolol tartrate (LOPRESSOR) 25 MG tablet TAKE 1/2 TABLET TWICE DAILY 90  tablet 3  . MIRALAX powder Take 17 g by mouth daily as needed for constipation.    . nitroGLYCERIN (NITROSTAT) 0.4 MG SL tablet Place 1 tablet (0.4 mg total) under the tongue every 5 (five) minutes as needed for chest pain. 25 tablet 3  . omeprazole (PRILOSEC) 40 MG capsule Take 1 capsule (40 mg total) by mouth daily. 90 capsule   . ramipril (ALTACE) 5 MG capsule TAKE 1 CAPSULE EVERY DAY 90 capsule 3  . rosuvastatin (CRESTOR) 40 MG tablet TAKE 1 TABLET EVERY DAY 90 tablet 2   No current facility-administered medications on file prior to visit.     PAST MEDICAL HISTORY: Past Medical History:  Diagnosis Date  . Aortic valve sclerosis    echo 12/2008, soft systolic murmur  . 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 Back pain   . Carotid artery disease ([facility])    40-59% bilateral, doppler December 2010  . Constipation   . Coronary artery disease    a. BMS-mid RCA 11/2007 b. stress echo 02/2009 subtle inferior HK, lateral ST depressions with stress c. follow-up cath 02/2009: RCA stent patent, severe but stable stenosis of distal posterior lateral branch RCA. LV normal, med tx unless more sx c. ETT Myoview 08/27/12: negative for ischemia; EF 64%  . Decreased hearing   . Depression    Mild, August, 2012  . Dyslipidemia   . Easy bruising   . Ejection fraction    EF normal, catheterization, 2011 /  EF 55%, echo, 2010  . Fluid overload    Mild, stable since hospitalization in the past  . GERD (gastroesophageal reflux disease)    occarionally, takes Pepcid over the counter  . Heart murmur    has, occasional PVC's  . History of right hip replacement   . Hyperlipidemia   . Hypertension   . Joint pain   . Myocardial infarction ([facility])    2009  . Palpitations   . PVC (premature ventricular contraction)    per prior Holter monitor  . Right hip pain 12/2009  . Shellfish allergy   . Trouble in sleeping   . Vitamin B12 deficiency   . Vitamin D deficiency     PAST SURGICAL HISTORY: Past Surgical  History:  Procedure Laterality Date  . 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/01/2005  . carcinoid tumor removal    . CARDIAC CATHETERIZATION  1/211   Patent RCA stent, stenotic PLB supplying small distribution; medically managed  . CORONARY ANGIOPLASTY WITH STENT PLACEMENT  11/2007   BMS-mid RCA  . DIAGNOSTIC LAPAROSCOPY     1982 for endometriosis  . DILATION AND CURETTAGE OF UTERUS  2005  . hx of wisdom teeth surgery    . SKIN CANCER EXCISION    . TOTAL HIP ARTHROPLASTY Right 07/08/2014   Procedure: RIGHT TOTAL HIP ARTHROPLASTY ANTERIOR APPROACH;  Surgeon: Christopher Y Blackman,   MD;  Location: [facility];  Service: Orthopedics;  Laterality: Right;    SOCIAL HISTORY: Social History   Tobacco Use  . Smoking status: Never Smoker  . Smokeless tobacco: Never Used  Substance Use Topics  . Alcohol use: Yes    Alcohol/week: 7.0 standard drinks    Types: 7 Glasses of wine per week    Comment: 1 glass of wine/night  . Drug use: No    FAMILY HISTORY: Family History  Problem Relation Age of Onset  . Heart attack Father 70  . Hyperlipidemia Father   . Hypertension Father   . Sudden death Father   . Stroke Mother   . Hyperlipidemia Mother   . Hypertension Mother   . Heart disease Mother   . Obesity Mother   . Heart attack Paternal Grandfather   . Heart disease Sister   . BRCA 1/2 Neg Hx   . Breast cancer Neg Hx     ROS: Review of Systems  Constitutional: Positive for weight loss.  Gastrointestinal: Negative for nausea and vomiting.  Musculoskeletal:       Negative muscle weakness    PHYSICAL EXAM: Blood pressure 117/65, pulse (!) 51, height 5' (1.524 m), weight 145 lb (65.8 kg), SpO2 99 %. Body mass index is 28.32 kg/m. Physical Exam Vitals signs reviewed.  Constitutional:      Appearance: Normal appearance. She is obese.  Cardiovascular:     Rate and Rhythm: Normal rate.     Pulses: Normal pulses.  Pulmonary:     Effort: Pulmonary effort is normal.     Breath sounds: Normal  breath sounds.  Musculoskeletal: Normal range of motion.  Skin:    General: Skin is warm and dry.  Neurological:     Mental Status: She is alert and oriented to person, place, and time.  Psychiatric:        Mood and Affect: Mood normal.        Behavior: Behavior normal.     RECENT LABS AND TESTS: BMET    Component Value Date/Time   NA 143 03/03/2018 1116   K 4.4 03/03/2018 1116   CL 103 03/03/2018 1116   CO2 23 03/03/2018 1116   GLUCOSE 89 03/03/2018 1116   GLUCOSE 92 10/14/2017 1923   BUN 22 03/03/2018 1116   CREATININE 0.85 03/03/2018 1116   CALCIUM 9.7 03/03/2018 1116   GFRNONAA 70 03/03/2018 1116   GFRAA 81 03/03/2018 1116   Lab Results  Component Value Date   HGBA1C 5.6 03/03/2018   HGBA1C 5.6 10/15/2017   HGBA1C 5.6 06/16/2017   Lab Results  Component Value Date   INSULIN 5.0 03/03/2018   INSULIN 5.9 10/15/2017   INSULIN 6.4 06/16/2017   CBC    Component Value Date/Time   WBC 7.4 03/03/2018 1116   WBC 12.2 (H) 10/14/2017 1923   RBC 4.20 03/03/2018 1116   RBC 4.21 10/14/2017 1923   HGB 13.2 03/03/2018 1116   HCT 39.3 03/03/2018 1116   PLT 235 10/14/2017 1923   MCV 94 03/03/2018 1116   MCH 31.4 03/03/2018 1116   MCH 31.8 10/14/2017 1923   MCHC 33.6 03/03/2018 1116   MCHC 33.8 10/14/2017 1923   RDW 11.9 03/03/2018 1116   LYMPHSABS 3.0 03/03/2018 1116   MONOABS 0.8 10/14/2017 1923   EOSABS 0.4 03/03/2018 1116   BASOSABS 0.1 03/03/2018 1116   Iron/TIBC/Ferritin/ %Sat    Component Value Date/Time   IRON 62 01/09/2015 0806   FERRITIN 26.6 01/09/2015 0806     IRONPCTSAT 15.2 (L) 01/09/2015 0806   Lipid Panel     Component Value Date/Time   CHOL 159 03/03/2018 1116   TRIG 105 03/03/2018 1116   HDL 69 03/03/2018 1116   CHOLHDL 2.5 10/29/2016 1030   CHOLHDL 3 11/30/2015 1028   VLDL 25.4 11/30/2015 1028   LDLCALC 69 03/03/2018 1116   Hepatic Function Panel     Component Value Date/Time   PROT 6.7 03/03/2018 1116   ALBUMIN 4.5 03/03/2018 1116     AST 22 03/03/2018 1116   ALT 18 03/03/2018 1116   ALKPHOS 75 03/03/2018 1116   BILITOT 0.3 03/03/2018 1116   BILIDIR 0.07 10/29/2016 1030      Component Value Date/Time   TSH 0.931 06/16/2017 0000   TSH 1.43 01/09/2015 0806   TSH 0.876 08/26/2012 1722      OBESITY BEHAVIORAL INTERVENTION VISIT  Today's visit was # 15   Starting weight: 157 lbs Starting date: 06/16/17 Today's weight : 145 lbs  Today's date: 04/16/2018 Total lbs lost to date: 12 At least 15 minutes were spent on discussing the following behavioral intervention visit.    04/16/2018  Height 5' (1.524 m)  Weight 145 lb (65.8 kg)  BMI (Calculated) 28.32  BLOOD PRESSURE - SYSTOLIC 379  BLOOD PRESSURE - DIASTOLIC 65   Body Fat % 02.4 %  Total Body Water (lbs) 57.8 lbs    ASK: We discussed the diagnosis of obesity with Courtney [name] today and Courtney [name] agreed to give [place] permission to discuss obesity behavioral modification therapy today.  ASSESS: Courtney [name] has the diagnosis of obesity and her BMI today is 28.32 Courtney [name] is in the action stage of change   ADVISE: Courtney [name] was educated on the multiple health risks of obesity as well as the benefit of weight loss to improve her health. She was advised of the need for long term treatment and the importance of lifestyle modifications to improve her current health and to decrease her risk of future health problems.  AGREE: Multiple dietary modification options and treatment options were discussed and  Courtney [name] agreed to follow the recommendations documented in the above note.  ARRANGE: Courtney [name] was educated on the importance of frequent visits to treat obesity as outlined per CMS and USPSTF guidelines and agreed to schedule her next follow up appointment today.  I, Trixie Dredge, am acting as transcriptionist for Dennard Nip, MD  I have reviewed the above documentation for accuracy and completeness, and I agree with the above. -Dennard Nip, MD

## 2018-04-28 NOTE — Progress Notes (Signed)
Subjective:   Courtney [name] is a 68 y.o. female who presents for Medicare Annual (Subsequent) preventive examination.  Review of Systems:  No ROS.  Medicare Wellness Visit. Additional risk factors are reflected in the social history.  Cardiac Risk Factors include: advanced age (>23mn, >>53women) Sleep patterns: has frequent nighttime awakenings, gets up 1-2 times nightly to void and sleeps hours varies nightly. Patient reports insomnia issues, discussed recommended sleep tips.   Home Safety/Smoke Alarms: Feels safe in home. Smoke alarms in place.  Living environment; residence and Firearm Safety: 1-story house/ trailer. Lives with partner, no needs for DME, good support system Seat Belt Safety/Bike Helmet: Wears seat belt.     Objective:     Vitals: BP 98/60   Pulse (!) 50   Resp (!) 99   Ht 5' (1.524 m)   Wt 147 lb (66.7 kg)   SpO2 99%   BMI 28.71 kg/m   Body mass index is 28.71 kg/m.  Advanced Directives 04/29/2018 10/14/2017 07/08/2014 07/01/2014  Does Patient Have a Medical Advance Directive? Yes Yes Yes Yes  Type of AParamedicof APacificLiving will H[place] Brow[place] will Mental Health Advance Directive;Healthcare Power of ATroy Does patient want to make changes to medical advance directive? - - No - Patient declined No - Patient declined  Copy of [place] Chart? No - copy requested - Yes Yes    Tobacco Social History   Tobacco Use  Smoking Status Never Smoker  Smokeless Tobacco Never Used     Counseling given: Not Answered  Past Medical History:  Diagnosis Date  . Aortic valve sclerosis    echo 178/4696 soft systolic murmur  . 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 Back pain   . Carotid artery disease ([place]    40-59% bilateral, doppler December 2010  . Constipation   . Coronary artery disease    a. BMS-mid RCA 11/2007 b. stress echo 02/2009 subtle inferior HK, lateral  ST depressions with stress c. follow-up cath 02/2009: RCA stent patent, severe but stable stenosis of distal posterior lateral branch RCA. LV normal, med tx unless more sx c. ETT Myoview 08/27/12: negative for ischemia; EF 64%  . Decreased hearing   . Depression    Mild, August, 2012  . Dyslipidemia   . Easy bruising   . Ejection fraction    EF normal, catheterization, 2011 /  EF 55%, echo, 2010  . Fluid overload    Mild, stable since hospitalization in the past  . GERD (gastroesophageal reflux disease)    occarionally, takes Pepcid over the counter  . Heart murmur    has, occasional PVC's  . History of right hip replacement   . Hyperlipidemia   . Hypertension   . Joint pain   . Myocardial infarction ([place]    2009  . Palpitations   . PVC (premature ventricular contraction)    per prior Holter monitor  . Right hip pain 12/2009  . Shellfish allergy   . Trouble in sleeping   . Vitamin B12 deficiency   . Vitamin D deficiency    Past Surgical History:  Procedure Laterality Date  . 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/01/2005  . carcinoid tumor removal    . CARDIAC CATHETERIZATION  1/211   Patent RCA stent, stenotic PLB supplying small distribution; medically managed  . CORONARY ANGIOPLASTY WITH STENT PLACEMENT  11/2007   BMS-mid RCA  . DIAGNOSTIC LAPAROSCOPY  1982 for endometriosis  . DILATION AND CURETTAGE OF UTERUS  2005  . hx of wisdom teeth surgery    . SKIN CANCER EXCISION    . TOTAL HIP ARTHROPLASTY Right 07/08/2014   Procedure: RIGHT TOTAL HIP ARTHROPLASTY ANTERIOR APPROACH;  Surgeon: Mcarthur Rossetti, MD;  Location: [facility];  Service: Orthopedics;  Laterality: Right;   Family History  Problem Relation Age of Onset  . Heart attack Father 69  . Hyperlipidemia Father   . Hypertension Father   . Sudden death Father   . Stroke Mother   . Hyperlipidemia Mother   . Hypertension Mother   . Heart disease Mother   . Obesity Mother   . Heart attack Paternal Grandfather   .  Heart disease Sister   . BRCA 1/2 Neg Hx   . Breast cancer Neg Hx    Social History   Socioeconomic History  . Marital status: Divorced    Spouse name: Not on file  . Number of children: 1  . Years of education: 108  . Highest education level: Not on file  Occupational History  . Occupation: Retired - Retail buyer  Social Needs  . Financial resource strain: Not hard at all  . Food insecurity:    Worry: Never true    Inability: Never true  . Transportation needs:    Medical: No    Non-medical: No  Tobacco Use  . Smoking status: Never Smoker  . Smokeless tobacco: Never Used  Substance and Sexual Activity  . Alcohol use: Yes    Alcohol/week: 7.0 standard drinks    Types: 7 Glasses of wine per week    Comment: 1 glass of wine/night  . Drug use: No  . Sexual activity: Yes  Lifestyle  . Physical activity:    Days per week: 3 days    Minutes per session: 30 min  . Stress: Not at all  Relationships  . Social connections:    Talks on phone: More than three times a week    Gets together: More than three times a week    Attends religious service: Not on file    Active member of club or organization: Not on file    Attends meetings of clubs or organizations: Not on file    Relationship status: Living with partner  Other Topics Concern  . Not on file  Social History Narrative   Fun: Dance, garden,    Denies religious beliefs effecting health care.   Denies abuse and feels safe at home    Outpatient Encounter Medications as of 04/29/2018  Medication Sig  . acetaminophen (TYLENOL) 500 MG tablet Take 1,000 mg by mouth every 6 (six) hours as needed (Pain).   . Alpha-D-Galactosidase (BEANO) TABS Take 1 tablet by mouth daily.  [name] aspirin 81 MG tablet Take 81 mg by mouth daily.  . diclofenac sodium (VOLTAREN) 1 % GEL Apply 2 g topically 4 (four) times daily. Rub into affected area of foot 2 to 4 times daily  . Docusate Calcium (STOOL SOFTENER PO) Take 1 tablet by mouth daily  as needed for constipation.  [name] ezetimibe (ZETIA) 10 MG tablet TAKE 1 TABLET (10 MG TOTAL) BY MOUTH DAILY.  . metoprolol tartrate (LOPRESSOR) 25 MG tablet TAKE 1/2 TABLET TWICE DAILY  . MIRALAX powder Take 17 g by mouth daily as needed for constipation.  . nitroGLYCERIN (NITROSTAT) 0.4 MG SL tablet Place 1 tablet (0.4 mg total) under the tongue every 5 (five) minutes as needed for  chest pain.  [name] omeprazole (PRILOSEC) 40 MG capsule Take 1 capsule (40 mg total) by mouth daily.  . ramipril (ALTACE) 5 MG capsule TAKE 1 CAPSULE EVERY DAY  . rosuvastatin (CRESTOR) 40 MG tablet TAKE 1 TABLET EVERY DAY  . Vitamin D, Ergocalciferol, (DRISDOL) 1.25 MG (50000 UT) CAPS capsule Take 1 capsule (50,000 Units total) by mouth every 7 (seven) days.   No facility-administered encounter medications on file as of 04/29/2018.     Activities of Daily Living In your present state of health, do you have any difficulty performing the following activities: 04/29/2018  Hearing? N  Vision? N  Difficulty concentrating or making decisions? N  Walking or climbing stairs? N  Dressing or bathing? N  Doing errands, shopping? N  Preparing Food and eating ? N  Using the Toilet? N  In the past six months, have you accidently leaked urine? N  Do you have problems with loss of bowel control? N  Managing your Medications? N  Managing your Finances? N  Housekeeping or managing your Housekeeping? N  Some recent data might be hidden    Patient Care Team: Lance Sell, NP as PCP - General (Nurse Practitioner)    Assessment:   This is a routine wellness examination for Warren. Physical assessment deferred to PCP.  Exercise Activities and Dietary recommendations Current Exercise Habits: Home exercise routine, Type of exercise: walking, Time (Minutes): 30, Frequency (Times/Week): 4, Weekly Exercise (Minutes/Week): 120, Intensity: Mild, Exercise limited by: None identified  Diet (meal preparation, eat out, water intake,  caffeinated beverages, dairy products, fruits and vegetables): in general, a "healthy" diet  , well balanced. eats a variety of fruits and vegetables daily, limits salt, fat/cholesterol, sugar,carbohydrates,caffeine, drinks 6-8 glasses of water daily. Currently followed by Dr. Dennard Nip.  Goals    . Patient Stated     Get back to walking more routinely and maintain my weight loss.        Fall Risk Fall Risk  04/29/2018 02/25/2017 01/06/2015  Falls in the past year? 0 No No   Depression Screen PHQ 2/9 Scores 04/29/2018 06/16/2017 02/25/2017 01/06/2015  PHQ - 2 Score 0 2 0 0  PHQ- 9 Score - 5 - -     Cognitive Function       Ad8 score reviewed for issues:  Issues making decisions: no  Less interest in hobbies / activities: no  Repeats questions, stories (family complaining): no  Trouble using ordinary gadgets (microwave, computer, phone):no  Forgets the month or year: no  Mismanaging finances: no  Remembering appts: no  Daily problems with thinking and/or memory: no Ad8 score is= 0  Immunization History  Administered Date(s) Administered  . Influenza, High Dose Seasonal PF 12/03/2016, 11/17/2017  . Influenza-Unspecified 10/20/2014  . Pneumococcal Conjugate-13 02/18/2014, 01/06/2015  . Pneumococcal Polysaccharide-23 11/29/2015  . Tdap 11/29/2015  . Zoster Recombinat (Shingrix) 12/26/2016, 03/04/2017   Screening Tests Health Maintenance  Topic Date Due  . PAP SMEAR-Modifier  04/29/2018 (Originally 07/30/2017)  . MAMMOGRAM  04/16/2020  . COLONOSCOPY  02/23/2023  . TETANUS/TDAP  11/28/2025  . INFLUENZA VACCINE  Completed  . DEXA SCAN  Completed  . Hepatitis C Screening  Completed  . PNA vac Low Risk Adult  Completed      Plan:     Reviewed health maintenance screenings with patient today and relevant education, vaccines, and/or referrals were provided.   Continue doing brain stimulating activities (puzzles, reading, adult coloring books, staying active) to  keep memory sharp.  Continue to eat heart healthy diet (full of fruits, vegetables, whole grains, lean protein, water--limit salt, fat, and sugar intake) and increase physical activity as tolerated.  I have personally reviewed and noted the following in the patient's chart:   . Medical and social history . Use of alcohol, tobacco or illicit drugs  . Current medications and supplements . Functional ability and status . Nutritional status . Physical activity . Advanced directives . List of other physicians . Vitals . Screenings to include cognitive, depression, and falls . Referrals and appointments  In addition, I have reviewed and discussed with patient certain preventive protocols, quality metrics, and best practice recommendations. A written personalized care plan for preventive services as well as general preventive health recommendations were provided to patient.     Michiel Cowboy, RN  04/29/2018

## 2018-04-29 ENCOUNTER — Other Ambulatory Visit: Payer: Self-pay

## 2018-04-29 ENCOUNTER — Telehealth: Payer: Self-pay | Admitting: *Deleted

## 2018-04-29 ENCOUNTER — Ambulatory Visit (INDEPENDENT_AMBULATORY_CARE_PROVIDER_SITE_OTHER): Payer: Medicare HMO | Admitting: *Deleted

## 2018-04-29 VITALS — BP 98/60 | HR 50 | Resp 99 | Ht 60.0 in | Wt 147.0 lb

## 2018-04-29 DIAGNOSIS — Z0000 Encounter for general adult medical examination without abnormal findings: Secondary | ICD-10-CM | POA: Diagnosis not present

## 2018-04-29 NOTE — Telephone Encounter (Signed)
During AWV, patient stated that she continues to experience headaches intermittently. Patient was seen for this several months ago by Provider Valere Dross. She went to the ED and then saw neurology, no acute abnormality was found. Patient explained that she has taken Fioricet in the past of which was effective and asked if PCP would prescribe this medication as needed.

## 2018-04-29 NOTE — Patient Instructions (Addendum)
Continue doing brain stimulating activities (puzzles, reading, adult coloring books, staying active) to keep memory sharp.   Continue to eat heart healthy diet (full of fruits, vegetables, whole grains, lean protein, water--limit salt, fat, and sugar intake) and increase physical activity as tolerated.   Courtney [name] , Thank you for taking time to come for your Medicare Wellness Visit. I appreciate your ongoing commitment to your health goals. Please review the following plan we discussed and let me know if I can assist you in the future.   These are the goals we discussed: Goals    . Patient Stated     Get back to walking more routinely and maintain my weight loss.        This is a list of the screening recommended for you and due dates:  Health Maintenance  Topic Date Due  . Pap Smear  04/29/2018*  . Mammogram  04/16/2020  . Colon Cancer Screening  02/23/2023  . Tetanus Vaccine  11/28/2025  . Flu Shot  Completed  . DEXA scan (bone density measurement)  Completed  .  Hepatitis C: One time screening is recommended by Center for Disease Control  (CDC) for  adults born from [birth year] through 1967.   Completed  . Pneumonia vaccines  Completed  *Topic was postponed. The date shown is not the original due date.   Health Maintenance, Female Adopting a healthy lifestyle and getting preventive care can go a long way to promote health and wellness. Talk with your health care provider about what schedule of regular examinations is right for you. This is a good chance for you to check in with your provider about disease prevention and staying healthy. In between checkups, there are plenty of things you can do on your own. Experts have done a lot of research about which lifestyle changes and preventive measures are most likely to keep you healthy. Ask your health care provider for more information. Weight and diet Eat a healthy diet  Be sure to include plenty of vegetables, fruits, low-fat dairy  products, and lean protein.  Do not eat a lot of foods high in solid fats, added sugars, or salt.  Get regular exercise. This is one of the most important things you can do for your health. ? Most adults should exercise for at least 150 minutes each week. The exercise should increase your heart rate and make you sweat (moderate-intensity exercise). ? Most adults should also do strengthening exercises at least twice a week. This is in addition to the moderate-intensity exercise. Maintain a healthy weight  Body mass index (BMI) is a measurement that can be used to identify possible weight problems. It estimates body fat based on height and weight. Your health care provider can help determine your BMI and help you achieve or maintain a healthy weight.  For females 12 years of age and older: ? A BMI below 18.5 is considered underweight. ? A BMI of 18.5 to 24.9 is normal. ? A BMI of 25 to 29.9 is considered overweight. ? A BMI of 30 and above is considered obese. Watch levels of cholesterol and blood lipids  You should start having your blood tested for lipids and cholesterol at 70 years of age, then have this test every 5 years.  You may need to have your cholesterol levels checked more often if: ? Your lipid or cholesterol levels are high. ? You are older than 70 years of age. ? You are at high risk for heart disease.  Cancer screening Lung Cancer  Lung cancer screening is recommended for adults 13-49 years old who are at high risk for lung cancer because of a history of smoking.  A yearly low-dose CT scan of the lungs is recommended for people who: ? Currently smoke. ? Have quit within the past 15 years. ? Have at least a 30-pack-year history of smoking. A pack year is smoking an average of one pack of cigarettes a day for 1 year.  Yearly screening should continue until it has been 15 years since you quit.  Yearly screening should stop if you develop a health problem that would  prevent you from having lung cancer treatment. Breast Cancer  Practice breast self-awareness. This means understanding how your breasts normally appear and feel.  It also means doing regular breast self-exams. Let your health care provider know about any changes, no matter how small.  If you are in your 20s or 30s, you should have a clinical breast exam (CBE) by a health care provider every 1-3 years as part of a regular health exam.  If you are 8 or older, have a CBE every year. Also consider having a breast X-ray (mammogram) every year.  If you have a family history of breast cancer, talk to your health care provider about genetic screening.  If you are at high risk for breast cancer, talk to your health care provider about having an MRI and a mammogram every year.  Breast cancer gene (BRCA) assessment is recommended for women who have family members with BRCA-related cancers. BRCA-related cancers include: ? Breast. ? Ovarian. ? Tubal. ? Peritoneal cancers.  Results of the assessment will determine the need for genetic counseling and BRCA1 and BRCA2 testing. Cervical Cancer Your health care provider may recommend that you be screened regularly for cancer of the pelvic organs (ovaries, uterus, and vagina). This screening involves a pelvic examination, including checking for microscopic changes to the surface of your cervix (Pap test). You may be encouraged to have this screening done every 3 years, beginning at age 73.  For women ages 7-65, health care providers may recommend pelvic exams and Pap testing every 3 years, or they may recommend the Pap and pelvic exam, combined with testing for human papilloma virus (HPV), every 5 years. Some types of HPV increase your risk of cervical cancer. Testing for HPV may also be done on women of any age with unclear Pap test results.  Other health care providers may not recommend any screening for nonpregnant women who are considered low risk for  pelvic cancer and who do not have symptoms. Ask your health care provider if a screening pelvic exam is right for you.  If you have had past treatment for cervical cancer or a condition that could lead to cancer, you need Pap tests and screening for cancer for at least 20 years after your treatment. If Pap tests have been discontinued, your risk factors (such as having a new sexual partner) need to be reassessed to determine if screening should resume. Some women have medical problems that increase the chance of getting cervical cancer. In these cases, your health care provider may recommend more frequent screening and Pap tests. Colorectal Cancer  This type of cancer can be detected and often prevented.  Routine colorectal cancer screening usually begins at 70 years of age and continues through 70 years of age.  Your health care provider may recommend screening at an earlier age if you have risk factors for colon cancer.  Your health care provider may also recommend using home test kits to check for hidden blood in the stool.  A small camera at the end of a tube can be used to examine your colon directly (sigmoidoscopy or colonoscopy). This is done to check for the earliest forms of colorectal cancer.  Routine screening usually begins at age 39.  Direct examination of the colon should be repeated every 5-10 years through 71 years of age. However, you may need to be screened more often if early forms of precancerous polyps or small growths are found. Skin Cancer  Check your skin from head to toe regularly.  Tell your health care provider about any new moles or changes in moles, especially if there is a change in a mole's shape or color.  Also tell your health care provider if you have a mole that is larger than the size of a pencil eraser.  Always use sunscreen. Apply sunscreen liberally and repeatedly throughout the day.  Protect yourself by wearing long sleeves, pants, a wide-brimmed  hat, and sunglasses whenever you are outside. Heart disease, diabetes, and high blood pressure  High blood pressure causes heart disease and increases the risk of stroke. High blood pressure is more likely to develop in: ? People who have blood pressure in the high end of the normal range (130-139/85-89 mm Hg). ? People who are overweight or obese. ? People who are African American.  If you are 63-38 years of age, have your blood pressure checked every 3-5 years. If you are 77 years of age or older, have your blood pressure checked every year. You should have your blood pressure measured twice-once when you are at a hospital or clinic, and once when you are not at a hospital or clinic. Record the average of the two measurements. To check your blood pressure when you are not at a hospital or clinic, you can use: ? An automated blood pressure machine at a pharmacy. ? A home blood pressure monitor.  If you are between 72 years and 39 years old, ask your health care provider if you should take aspirin to prevent strokes.  Have regular diabetes screenings. This involves taking a blood sample to check your fasting blood sugar level. ? If you are at a normal weight and have a low risk for diabetes, have this test once every three years after 70 years of age. ? If you are overweight and have a high risk for diabetes, consider being tested at a younger age or more often. Preventing infection Hepatitis B  If you have a higher risk for hepatitis B, you should be screened for this virus. You are considered at high risk for hepatitis B if: ? You were born in a country where hepatitis B is common. Ask your health care provider which countries are considered high risk. ? Your parents were born in a high-risk country, and you have not been immunized against hepatitis B (hepatitis B vaccine). ? You have HIV or AIDS. ? You use needles to inject street drugs. ? You live with someone who has hepatitis B. ? You  have had sex with someone who has hepatitis B. ? You get hemodialysis treatment. ? You take certain medicines for conditions, including cancer, organ transplantation, and autoimmune conditions. Hepatitis C  Blood testing is recommended for: ? Everyone born from [birth year] through 1967. ? Anyone with known risk factors for hepatitis C. Sexually transmitted infections (STIs)  You should be screened for sexually transmitted infections (  STIs) including gonorrhea and chlamydia if: ? You are sexually active and are younger than 70 years of age. ? You are older than 70 years of age and your health care provider tells you that you are at risk for this type of infection. ? Your sexual activity has changed since you were last screened and you are at an increased risk for chlamydia or gonorrhea. Ask your health care provider if you are at risk.  If you do not have HIV, but are at risk, it may be recommended that you take a prescription medicine daily to prevent HIV infection. This is called pre-exposure prophylaxis (PrEP). You are considered at risk if: ? You are sexually active and do not regularly use condoms or know the HIV status of your partner(s). ? You take drugs by injection. ? You are sexually active with a partner who has HIV. Talk with your health care provider about whether you are at high risk of being infected with HIV. If you choose to begin PrEP, you should first be tested for HIV. You should then be tested every 3 months for as long as you are taking PrEP. Pregnancy  If you are premenopausal and you may become pregnant, ask your health care provider about preconception counseling.  If you may become pregnant, take 400 to 800 micrograms (mcg) of folic acid every day.  If you want to prevent pregnancy, talk to your health care provider about birth control (contraception). Osteoporosis and menopause  Osteoporosis is a disease in which the bones lose minerals and strength with aging. This  can result in serious bone fractures. Your risk for osteoporosis can be identified using a bone density scan.  If you are 4 years of age or older, or if you are at risk for osteoporosis and fractures, ask your health care provider if you should be screened.  Ask your health care provider whether you should take a calcium or vitamin D supplement to lower your risk for osteoporosis.  Menopause may have certain physical symptoms and risks.  Hormone replacement therapy may reduce some of these symptoms and risks. Talk to your health care provider about whether hormone replacement therapy is right for you. Follow these instructions at home:  Schedule regular health, dental, and eye exams.  Stay current with your immunizations.  Do not use any tobacco products including cigarettes, chewing tobacco, or electronic cigarettes.  If you are pregnant, do not drink alcohol.  If you are breastfeeding, limit how much and how often you drink alcohol.  Limit alcohol intake to no more than 1 drink per day for nonpregnant women. One drink equals 12 ounces of beer, 5 ounces of wine, or 1 ounces of hard liquor.  Do not use street drugs.  Do not share needles.  Ask your health care provider for help if you need support or information about quitting drugs.  Tell your health care provider if you often feel depressed.  Tell your health care provider if you have ever been abused or do not feel safe at home. This information is not intended to replace advice given to you by your health care provider. Make sure you discuss any questions you have with your health care provider. Document Released: 08/20/2010 Document Revised: 07/13/2015 Document Reviewed: 11/08/2014 Elsevier Interactive Patient Education  2019 [employer].

## 2018-04-29 NOTE — Progress Notes (Signed)
Medical screening examination/treatment/procedure(s) were performed by the Wellness Coach, RN. As primary care provider I was immediately available for consulation/collaboration. I agree with above documentation. Ashleigh Shambley, NP  

## 2018-05-04 ENCOUNTER — Other Ambulatory Visit: Payer: Self-pay | Admitting: *Deleted

## 2018-05-04 DIAGNOSIS — H524 Presbyopia: Secondary | ICD-10-CM | POA: Diagnosis not present

## 2018-05-04 DIAGNOSIS — K2100 Gastro-esophageal reflux disease with esophagitis, without bleeding: Secondary | ICD-10-CM

## 2018-05-04 MED ORDER — OMEPRAZOLE 40 MG PO CPDR: 40.0000 mg | DELAYED_RELEASE_CAPSULE | Freq: Every day | ORAL | Status: DC

## 2018-05-04 NOTE — Telephone Encounter (Signed)
Pt informed of below  Verbalized understanding

## 2018-05-04 NOTE — Telephone Encounter (Signed)
Please let her know that I reviewed her neurology consult note. He wanted her to notify him if she continued to have persisting headaches. He was concerned that she might have an inflammatory process in her temporal artery causing the headaches. Would like her to call neurology first please; if they feel Fioricet appropriate, they can prescribe.

## 2018-05-04 NOTE — Telephone Encounter (Signed)
Called and informed patient that her prescription omeprazole was refilled through [facility] as requested. Patient verbalized understanding.

## 2018-05-07 ENCOUNTER — Other Ambulatory Visit: Payer: Self-pay

## 2018-05-07 ENCOUNTER — Encounter (INDEPENDENT_AMBULATORY_CARE_PROVIDER_SITE_OTHER): Payer: Self-pay | Admitting: Family Medicine

## 2018-05-07 ENCOUNTER — Ambulatory Visit (INDEPENDENT_AMBULATORY_CARE_PROVIDER_SITE_OTHER): Payer: Medicare HMO | Admitting: Family Medicine

## 2018-05-07 VITALS — BP 117/69 | HR 42 | Ht 60.0 in | Wt 140.0 lb

## 2018-05-07 DIAGNOSIS — N6001 Solitary cyst of right breast: Secondary | ICD-10-CM | POA: Insufficient documentation

## 2018-05-07 DIAGNOSIS — Z6830 Body mass index (BMI) 30.0-30.9, adult: Secondary | ICD-10-CM

## 2018-05-07 DIAGNOSIS — E669 Obesity, unspecified: Secondary | ICD-10-CM | POA: Diagnosis not present

## 2018-05-07 DIAGNOSIS — E559 Vitamin D deficiency, unspecified: Secondary | ICD-10-CM

## 2018-05-07 MED ORDER — VITAMIN D (ERGOCALCIFEROL) 1.25 MG (50000 UNIT) PO CAPS: 50000.0000 [IU] | ORAL_CAPSULE | ORAL | 0 refills | Status: DC

## 2018-05-07 NOTE — Progress Notes (Signed)
Office: [phone]  /  Fax: [phone]   HPI:   Chief Complaint: OBESITY Courtney [name] is here to discuss her progress with her obesity treatment plan. She is on the Category 1 plan and is following her eating plan approximately 95% of the time. She states she is walking 40 minutes 2 times per week. Tanasia has worked to [employer] and has done well meeting her protein and vegetable goal. She states her hunger is controlled and she is doing well with meal planning. Her weight is 140 lb (63.5 kg) today and has had a weight loss of 5 pounds over a period of 3 weeks since her last visit. She has lost 17 lbs since starting treatment with [place].  Vitamin D deficiency Puneet has a diagnosis of Vitamin D deficiency. She is currently stable on prescription Vit D and denies nausea, vomiting or muscle weakness.  ASSESSMENT AND PLAN:  Vitamin D deficiency - Plan: Vitamin D, Ergocalciferol, (DRISDOL) 1.25 MG (50000 UT) CAPS capsule  Class 1 obesity with serious comorbidity and body mass index (BMI) of 30.0 to 30.9 in adult, unspecified obesity type - Starting BMI greater then 30  PLAN:  Vitamin D Deficiency Zurii was informed that low Vitamin D levels contributes to fatigue and are associated with obesity, breast, and colon cancer. She agrees to continue to take prescription Vit D @ 50,000 IU every week #4 with 0 refills and will follow-up for routine testing of Vitamin D, at least 2-3 times per year. She was informed of the risk of over-replacement of Vitamin D and agrees to not increase her dose unless she discusses this with [place] first. Drema agrees to follow-up with our clinic in 3 weeks.  Obesity Isys is currently in the action stage of change. As such, her goal is to continue with weight loss efforts. She has agreed to keep a food journal with 1100 calories and 75 grams of protein. Onetta has been instructed to work up to a goal of 150 minutes of combined cardio and strengthening exercise per week for  weight loss and overall health benefits. We discussed the following Behavioral Modification Strategies today: increasing lean protein intake, work on meal planning and easy cooking plans, keeping healthy foods in the home, ways to avoid boredom eating, ways to avoid night time snacking, better snacking choices, and emotional eating strategies.  Kaley has agreed to follow-up with our clinic in 3 weeks. She was informed of the importance of frequent follow-up visits to maximize her success with intensive lifestyle modifications for her multiple health conditions.  ALLERGIES: Allergies  Allergen Reactions  . Erythromycin Hives  . Penicillins Hives    Has patient had a PCN reaction causing immediate rash, facial/tongue/throat swelling, SOB or lightheadedness with hypotension: No Has patient had a PCN reaction causing severe rash involving mucus membranes or skin necrosis: No Has patient had a PCN reaction that required hospitalization: No Has patient had a PCN reaction occurring within the last 10 years: No If all of the above answers are "NO", then may proceed with Cephalosporin use.   [name] Shellfish Allergy Hives    MEDICATIONS: Current Outpatient Medications on File Prior to Visit  Medication Sig Dispense Refill  . acetaminophen (TYLENOL) 500 MG tablet Take 1,000 mg by mouth every 6 (six) hours as needed (Pain).     . Alpha-D-Galactosidase (BEANO) TABS Take 1 tablet by mouth daily.    [name] aspirin 81 MG tablet Take 81 mg by mouth daily.    . diclofenac sodium (  VOLTAREN) 1 % GEL Apply 2 g topically 4 (four) times daily. Rub into affected area of foot 2 to 4 times daily 100 g 2  . Docusate Calcium (STOOL SOFTENER PO) Take 1 tablet by mouth daily as needed for constipation.    [name] ezetimibe (ZETIA) 10 MG tablet TAKE 1 TABLET (10 MG TOTAL) BY MOUTH DAILY. 90 tablet 3  . metoprolol tartrate (LOPRESSOR) 25 MG tablet TAKE 1/2 TABLET TWICE DAILY 90 tablet 3  . MIRALAX powder Take 17 g by mouth daily as  needed for constipation.    . nitroGLYCERIN (NITROSTAT) 0.4 MG SL tablet Place 1 tablet (0.4 mg total) under the tongue every 5 (five) minutes as needed for chest pain. 25 tablet 3  . omeprazole (PRILOSEC) 40 MG capsule Take 1 capsule (40 mg total) by mouth daily. 90 capsule   . ramipril (ALTACE) 5 MG capsule TAKE 1 CAPSULE EVERY DAY 90 capsule 3  . rosuvastatin (CRESTOR) 40 MG tablet TAKE 1 TABLET EVERY DAY 90 tablet 2   No current facility-administered medications on file prior to visit.     PAST MEDICAL HISTORY: Past Medical History:  Diagnosis Date  . Aortic valve sclerosis    echo 41/7408, soft systolic murmur  . 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 Back pain   . Carotid artery disease ([place])    40-59% bilateral, doppler December 2010  . Constipation   . Coronary artery disease    a. BMS-mid RCA 11/2007 b. stress echo 02/2009 subtle inferior HK, lateral ST depressions with stress c. follow-up cath 02/2009: RCA stent patent, severe but stable stenosis of distal posterior lateral branch RCA. LV normal, med tx unless more sx c. ETT Myoview 08/27/12: negative for ischemia; EF 64%  . Decreased hearing   . Depression    Mild, August, 2012  . Dyslipidemia   . Easy bruising   . Ejection fraction    EF normal, catheterization, 2011 /  EF 55%, echo, 2010  . Fluid overload    Mild, stable since hospitalization in the past  . GERD (gastroesophageal reflux disease)    occarionally, takes Pepcid over the counter  . Heart murmur    has, occasional PVC's  . History of right hip replacement   . Hyperlipidemia   . Hypertension   . Joint pain   . Myocardial infarction ([place])    2009  . Palpitations   . PVC (premature ventricular contraction)    per prior Holter monitor  . Right hip pain 12/2009  . Shellfish allergy   . Trouble in sleeping   . Vitamin B12 deficiency   . Vitamin D deficiency     PAST SURGICAL HISTORY: Past Surgical History:  Procedure Laterality Date  . BREAST BIOPSY Left  08/01/2005  . carcinoid tumor removal    . CARDIAC CATHETERIZATION  1/211   Patent RCA stent, stenotic PLB supplying small distribution; medically managed  . CORONARY ANGIOPLASTY WITH STENT PLACEMENT  11/2007   BMS-mid RCA  . DIAGNOSTIC LAPAROSCOPY     1982 for endometriosis  . DILATION AND CURETTAGE OF UTERUS  2005  . hx of wisdom teeth surgery    . SKIN CANCER EXCISION    . TOTAL HIP ARTHROPLASTY Right 07/08/2014   Procedure: RIGHT TOTAL HIP ARTHROPLASTY ANTERIOR APPROACH;  Surgeon: Mcarthur Rossetti, MD;  Location: [facility];  Service: Orthopedics;  Laterality: Right;    SOCIAL HISTORY: Social History   Tobacco Use  . Smoking status: Never Smoker  .  Smokeless tobacco: Never Used  Substance Use Topics  . Alcohol use: Yes    Alcohol/week: 7.0 standard drinks    Types: 7 Glasses of wine per week    Comment: 1 glass of wine/night  . Drug use: No    FAMILY HISTORY: Family History  Problem Relation Age of Onset  . Heart attack Father 19  . Hyperlipidemia Father   . Hypertension Father   . Sudden death Father   . Stroke Mother   . Hyperlipidemia Mother   . Hypertension Mother   . Heart disease Mother   . Obesity Mother   . Heart attack Paternal Grandfather   . Heart disease Sister   . BRCA 1/2 Neg Hx   . Breast cancer Neg Hx    ROS: Review of Systems  Constitutional: Positive for weight loss.  Gastrointestinal: Negative for nausea and vomiting.  Musculoskeletal:       Negative for muscle weakness.   PHYSICAL EXAM: Blood pressure 117/69, pulse (!) 42, height 5' (1.524 m), weight 140 lb (63.5 kg), SpO2 98 %. Body mass index is 27.34 kg/m. Physical Exam Vitals signs reviewed.  Constitutional:      Appearance: Normal appearance. She is obese.  Cardiovascular:     Rate and Rhythm: Normal rate.     Pulses: Normal pulses.  Pulmonary:     Effort: Pulmonary effort is normal.     Breath sounds: Normal breath sounds.  Musculoskeletal: Normal range of motion.   Skin:    General: Skin is warm and dry.  Neurological:     Mental Status: She is alert and oriented to person, place, and time.  Psychiatric:        Behavior: Behavior normal.   RECENT LABS AND TESTS: BMET    Component Value Date/Time   NA 143 03/03/2018 1116   K 4.4 03/03/2018 1116   CL 103 03/03/2018 1116   CO2 23 03/03/2018 1116   GLUCOSE 89 03/03/2018 1116   GLUCOSE 92 10/14/2017 1923   BUN 22 03/03/2018 1116   CREATININE 0.85 03/03/2018 1116   CALCIUM 9.7 03/03/2018 1116   GFRNONAA 70 03/03/2018 1116   GFRAA 81 03/03/2018 1116   Lab Results  Component Value Date   HGBA1C 5.6 03/03/2018   HGBA1C 5.6 10/15/2017   HGBA1C 5.6 06/16/2017   Lab Results  Component Value Date   INSULIN 5.0 03/03/2018   INSULIN 5.9 10/15/2017   INSULIN 6.4 06/16/2017   CBC    Component Value Date/Time   WBC 7.4 03/03/2018 1116   WBC 12.2 (H) 10/14/2017 1923   RBC 4.20 03/03/2018 1116   RBC 4.21 10/14/2017 1923   HGB 13.2 03/03/2018 1116   HCT 39.3 03/03/2018 1116   PLT 235 10/14/2017 1923   MCV 94 03/03/2018 1116   MCH 31.4 03/03/2018 1116   MCH 31.8 10/14/2017 1923   MCHC 33.6 03/03/2018 1116   MCHC 33.8 10/14/2017 1923   RDW 11.9 03/03/2018 1116   LYMPHSABS 3.0 03/03/2018 1116   MONOABS 0.8 10/14/2017 1923   EOSABS 0.4 03/03/2018 1116   BASOSABS 0.1 03/03/2018 1116   Iron/TIBC/Ferritin/ %Sat    Component Value Date/Time   IRON 62 01/09/2015 0806   FERRITIN 26.6 01/09/2015 0806   IRONPCTSAT 15.2 (L) 01/09/2015 0806   Lipid Panel     Component Value Date/Time   CHOL 159 03/03/2018 1116   TRIG 105 03/03/2018 1116   HDL 69 03/03/2018 1116   CHOLHDL 2.5 10/29/2016 1030   CHOLHDL 3 11/30/2015  1028   VLDL 25.4 11/30/2015 1028   LDLCALC 69 03/03/2018 1116   Hepatic Function Panel     Component Value Date/Time   PROT 6.7 03/03/2018 1116   ALBUMIN 4.5 03/03/2018 1116   AST 22 03/03/2018 1116   ALT 18 03/03/2018 1116   ALKPHOS 75 03/03/2018 1116   BILITOT 0.3  03/03/2018 1116   BILIDIR 0.07 10/29/2016 1030      Component Value Date/Time   TSH 0.931 06/16/2017 0000   TSH 1.43 01/09/2015 0806   TSH 0.876 08/26/2012 1722   Results for BREYANNA, VALERA (MRN [id number]) as of 05/07/2018 13:05  Ref. Range 03/03/2018 11:16  Vitamin D, 25-Hydroxy Latest Ref Range: 30.0 - 100.0 ng/mL 54.6   OBESITY BEHAVIORAL INTERVENTION VISIT  Today's visit was #16  Starting weight: 157 lbs Starting date: 06/16/2017 Today's weight: 140 lbs  Today's date: 05/07/2018 Total lbs lost to date: 17 At least 15 minutes were spent on discussing the following behavioral intervention visit.    05/07/2018  Height 5' (1.524 m)  Weight 140 lb (63.5 kg)  BMI (Calculated) 27.34  BLOOD PRESSURE - SYSTOLIC 989  BLOOD PRESSURE - DIASTOLIC 69   Body Fat % 21.1 %  Total Body Water (lbs) 56.4 lbs   ASK: We discussed the diagnosis of obesity with [name] today and Teaghan agreed to give [place] permission to discuss obesity behavioral modification therapy today.  ASSESS: Marybeth has the diagnosis of obesity and her BMI today is 27.34. Youlanda is in the action stage of change.   ADVISE: Irianna was educated on the multiple health risks of obesity as well as the benefit of weight loss to improve her health. She was advised of the need for long term treatment and the importance of lifestyle modifications to improve her current health and to decrease her risk of future health problems.  AGREE: Multiple dietary modification options and treatment options were discussed and  Makenah agreed to follow the recommendations documented in the above note.  ARRANGE: Mazal was educated on the importance of frequent visits to treat obesity as outlined per CMS and USPSTF guidelines and agreed to schedule her next follow-up appointment today.  I, Michaelene Song, am acting as Location manager for Dennard Nip, MD  I have reviewed the above documentation for accuracy and completeness, and I agree with the  above. -Dennard Nip, MD

## 2018-05-11 ENCOUNTER — Telehealth: Payer: Self-pay | Admitting: *Deleted

## 2018-05-11 DIAGNOSIS — K2100 Gastro-esophageal reflux disease with esophagitis, without bleeding: Secondary | ICD-10-CM

## 2018-05-11 MED ORDER — OMEPRAZOLE 40 MG PO CPDR: 40.0000 mg | DELAYED_RELEASE_CAPSULE | Freq: Every day | ORAL | Status: DC

## 2018-05-11 MED ORDER — OMEPRAZOLE 40 MG PO CPDR: 40.0000 mg | DELAYED_RELEASE_CAPSULE | Freq: Every day | ORAL | 0 refills | Status: DC

## 2018-05-11 NOTE — Telephone Encounter (Signed)
Called patient to inform that prilosec 40 mg. prescription refill was sent to [facility] as requested by patient.

## 2018-05-11 NOTE — Telephone Encounter (Signed)
Can you check on this, sounds like prilosec Rx has been sent twice but Mcarthur Rossetti is telling her they have not received Rx

## 2018-05-11 NOTE — Telephone Encounter (Signed)
Patient called nurse and LVM that [employer] did not receive that prescription sent earlier today for prilosec. Patient requested a call-back to confirm it was sent to the correct [facility] and to discuss other prescription options.

## 2018-05-12 ENCOUNTER — Encounter (INDEPENDENT_AMBULATORY_CARE_PROVIDER_SITE_OTHER): Payer: Self-pay

## 2018-05-12 MED ORDER — OMEPRAZOLE 40 MG PO CPDR: 40.0000 mg | DELAYED_RELEASE_CAPSULE | Freq: Every day | ORAL | 0 refills | Status: DC

## 2018-05-12 NOTE — Telephone Encounter (Signed)
Omeprazole refill sent on 05/11/18. [place] on 05/12/18 . Omeprazole 40 mg rx was received and will be processed today. Pt informed.

## 2018-05-12 NOTE — Addendum Note (Signed)
Addended by: Cresenciano Lick on: 05/12/2018 11:06 AM   Modules accepted: Orders

## 2018-05-28 ENCOUNTER — Ambulatory Visit (INDEPENDENT_AMBULATORY_CARE_PROVIDER_SITE_OTHER): Payer: Medicare HMO | Admitting: Family Medicine

## 2018-06-01 ENCOUNTER — Encounter (INDEPENDENT_AMBULATORY_CARE_PROVIDER_SITE_OTHER): Payer: Self-pay | Admitting: Family Medicine

## 2018-06-02 ENCOUNTER — Telehealth: Payer: Self-pay

## 2018-06-02 DIAGNOSIS — K2100 Gastro-esophageal reflux disease with esophagitis, without bleeding: Secondary | ICD-10-CM

## 2018-06-02 MED ORDER — EZETIMIBE 10 MG PO TABS: 10.0000 mg | ORAL_TABLET | Freq: Every day | ORAL | 3 refills | Status: DC

## 2018-06-02 MED ORDER — ROSUVASTATIN CALCIUM 40 MG PO TABS: 40.0000 mg | ORAL_TABLET | Freq: Every day | ORAL | 3 refills | Status: DC

## 2018-06-02 MED ORDER — RAMIPRIL 5 MG PO CAPS: 5.0000 mg | ORAL_CAPSULE | Freq: Every day | ORAL | 3 refills | Status: DC

## 2018-06-02 MED ORDER — OMEPRAZOLE 40 MG PO CPDR: 40.0000 mg | DELAYED_RELEASE_CAPSULE | Freq: Every day | ORAL | 3 refills | Status: DC

## 2018-06-02 MED ORDER — METOPROLOL TARTRATE 25 MG PO TABS: 12.5000 mg | ORAL_TABLET | Freq: Two times a day (BID) | ORAL | 3 refills | Status: DC

## 2018-06-02 NOTE — Telephone Encounter (Signed)
Please review

## 2018-06-02 NOTE — Telephone Encounter (Signed)
Spoke to patient she stated she is due for a follow up visit with Dr.Jordan in June.Stated she is doing well,no problems.Follow up appointment scheduled 11/03/18 at 2:00 pm.Advised to call sooner if needed.

## 2018-07-09 ENCOUNTER — Encounter: Payer: Self-pay | Admitting: Family Medicine

## 2018-07-09 ENCOUNTER — Ambulatory Visit (INDEPENDENT_AMBULATORY_CARE_PROVIDER_SITE_OTHER): Payer: Medicare HMO | Admitting: Family Medicine

## 2018-07-09 ENCOUNTER — Other Ambulatory Visit: Payer: Self-pay

## 2018-07-09 ENCOUNTER — Ambulatory Visit: Payer: Self-pay

## 2018-07-09 VITALS — BP 138/78 | HR 47 | Ht 60.0 in | Wt 144.0 lb

## 2018-07-09 DIAGNOSIS — E559 Vitamin D deficiency, unspecified: Secondary | ICD-10-CM | POA: Diagnosis not present

## 2018-07-09 DIAGNOSIS — M79644 Pain in right finger(s): Secondary | ICD-10-CM

## 2018-07-09 DIAGNOSIS — S62501A Fracture of unspecified phalanx of right thumb, initial encounter for closed fracture: Secondary | ICD-10-CM | POA: Diagnosis not present

## 2018-07-09 DIAGNOSIS — G8929 Other chronic pain: Secondary | ICD-10-CM | POA: Diagnosis not present

## 2018-07-09 MED ORDER — VITAMIN D (ERGOCALCIFEROL) 1.25 MG (50000 UNIT) PO CAPS: 50000.0000 [IU] | ORAL_CAPSULE | ORAL | 0 refills | Status: DC

## 2018-07-09 NOTE — Progress Notes (Signed)
[name] [place], [place] [address] Phone: [phone] Subjective:   I Courtney [name] am serving as a Education administrator for Dr. Hulan Saas.  I'm seeing this patient by the request  of:    CC: Right thumb injury  LGX:[id number]  Courtney [name] is a 68 y.o. female coming in with complaint of right thumb pain. Believes it is due to the repetitive use from a stapler that she used about 400 times.   Onset- April 13th Location - Tip of the finger Duration-  Character- throbbing, sharp Aggravating factors- hitting the tip, ROM is limited  Reliving factors-  Therapies tried- Voltaren, heat, ice, cbd lotion, tylenol Severity-9 out of 10 initially and then is improving     Past Medical History:  Diagnosis Date  . Aortic valve sclerosis    echo 14/4818, soft systolic murmur  . 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 Back pain   . Carotid artery disease ([place])    40-59% bilateral, doppler December 2010  . Constipation   . Coronary artery disease    a. BMS-mid RCA 11/2007 b. stress echo 02/2009 subtle inferior HK, lateral ST depressions with stress c. follow-up cath 02/2009: RCA stent patent, severe but stable stenosis of distal posterior lateral branch RCA. LV normal, med tx unless more sx c. ETT Myoview 08/27/12: negative for ischemia; EF 64%  . Decreased hearing   . Depression    Mild, August, 2012  . Dyslipidemia   . Easy bruising   . Ejection fraction    EF normal, catheterization, 2011 /  EF 55%, echo, 2010  . Fluid overload    Mild, stable since hospitalization in the past  . GERD (gastroesophageal reflux disease)    occarionally, takes Pepcid over the counter  . Heart murmur    has, occasional PVC's  . History of right hip replacement   . Hyperlipidemia   . Hypertension   . Joint pain   . Myocardial infarction ([place])    2009  . Palpitations   . PVC (premature ventricular contraction)    per prior Holter monitor  . Right hip pain 12/2009  .  Shellfish allergy   . Trouble in sleeping   . Vitamin B12 deficiency   . Vitamin D deficiency    Past Surgical History:  Procedure Laterality Date  . 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/01/2005  . carcinoid tumor removal    . CARDIAC CATHETERIZATION  1/211   Patent RCA stent, stenotic PLB supplying small distribution; medically managed  . CORONARY ANGIOPLASTY WITH STENT PLACEMENT  11/2007   BMS-mid RCA  . DIAGNOSTIC LAPAROSCOPY     1982 for endometriosis  . DILATION AND CURETTAGE OF UTERUS  2005  . hx of wisdom teeth surgery    . SKIN CANCER EXCISION    . TOTAL HIP ARTHROPLASTY Right 07/08/2014   Procedure: RIGHT TOTAL HIP ARTHROPLASTY ANTERIOR APPROACH;  Surgeon: Mcarthur Rossetti, MD;  Location: [facility];  Service: Orthopedics;  Laterality: Right;   Social History   Socioeconomic History  . Marital status: Divorced    Spouse name: Not on file  . Number of children: 1  . Years of education: 40  . Highest education level: Not on file  Occupational History  . Occupation: Retired - Retail buyer  Social Needs  . Financial resource strain: Not hard at all  . Food insecurity:    Worry: Never true    Inability: Never true  . Transportation  needs:    Medical: No    Non-medical: No  Tobacco Use  . Smoking status: Never Smoker  . Smokeless tobacco: Never Used  Substance and Sexual Activity  . Alcohol use: Yes    Alcohol/week: 7.0 standard drinks    Types: 7 Glasses of wine per week    Comment: 1 glass of wine/night  . Drug use: No  . Sexual activity: Yes  Lifestyle  . Physical activity:    Days per week: 3 days    Minutes per session: 30 min  . Stress: Not at all  Relationships  . Social connections:    Talks on phone: More than three times a week    Gets together: More than three times a week    Attends religious service: Not on file    Active member of club or organization: Not on file    Attends meetings of clubs or organizations: Not on file    Relationship status:  Living with partner  Other Topics Concern  . Not on file  Social History Narrative   Fun: Dance, garden,    Denies religious beliefs effecting health care.   Denies abuse and feels safe at home   Allergies  Allergen Reactions  . Erythromycin Hives  . Penicillins Hives    Has patient had a PCN reaction causing immediate rash, facial/tongue/throat swelling, SOB or lightheadedness with hypotension: No Has patient had a PCN reaction causing severe rash involving mucus membranes or skin necrosis: No Has patient had a PCN reaction that required hospitalization: No Has patient had a PCN reaction occurring within the last 10 years: No If all of the above answers are "NO", then may proceed with Cephalosporin use.   . Shellfish Allergy Hives   Family History  Problem Relation Age of Onset  . Heart attack Father 36  . Hyperlipidemia Father   . Hypertension Father   . Sudden death Father   . Stroke Mother   . Hyperlipidemia Mother   . Hypertension Mother   . Heart disease Mother   . Obesity Mother   . Heart attack Paternal Grandfather   . Heart disease Sister   . BRCA 1/2 Neg Hx   . Breast cancer Neg Hx      Current Outpatient Medications (Cardiovascular):  .  ezetimibe (ZETIA) 10 MG tablet, Take 1 tablet (10 mg total) by mouth daily. .  metoprolol tartrate (LOPRESSOR) 25 MG tablet, Take 0.5 tablets (12.5 mg total) by mouth 2 (two) times daily. .  nitroGLYCERIN (NITROSTAT) 0.4 MG SL tablet, Place 1 tablet (0.4 mg total) under the tongue every 5 (five) minutes as needed for chest pain. .  ramipril (ALTACE) 5 MG capsule, Take 1 capsule (5 mg total) by mouth daily. .  rosuvastatin (CRESTOR) 40 MG tablet, Take 1 tablet (40 mg total) by mouth daily.   Current Outpatient Medications (Analgesics):  .  acetaminophen (TYLENOL) 500 MG tablet, Take 1,000 mg by mouth every 6 (six) hours as needed (Pain).  [name]  aspirin 81 MG tablet, Take 81 mg by mouth daily.   Current Outpatient Medications  (Other):  [name]  Alpha-D-Galactosidase (BEANO) TABS, Take 1 tablet by mouth daily. .  diclofenac sodium (VOLTAREN) 1 % GEL, Apply 2 g topically 4 (four) times daily. Rub into affected area of foot 2 to 4 times daily .  Docusate Calcium (STOOL SOFTENER PO), Take 1 tablet by mouth daily as needed for constipation. [name]  MIRALAX powder, Take 17 g by mouth daily as  needed for constipation. [name]  omeprazole (PRILOSEC) 40 MG capsule, Take 1 capsule (40 mg total) by mouth daily. .  Vitamin D, Ergocalciferol, (DRISDOL) 1.25 MG (50000 UT) CAPS capsule, Take 1 capsule (50,000 Units total) by mouth every 7 (seven) days.    Past medical history, social, surgical and family history all reviewed in electronic medical record.  No pertanent information unless stated regarding to the chief complaint.   Review of Systems:  No headache, visual changes, nausea, vomiting, diarrhea, constipation, dizziness, abdominal pain, skin rash, fevers, chills, night sweats, weight loss, swollen lymph nodes, body aches, joint swelling, , chest pain, shortness of breath, mood changes.  Positive muscle aches  Objective  Blood pressure 138/78, pulse (!) 47, height 5' (1.524 m), weight 144 lb (65.3 kg), SpO2 98 %. f    General: No apparent distress alert and oriented x3 mood and affect normal, dressed appropriately.  HEENT: Pupils equal, extraocular movements intact  Respiratory: Patient's speak in full sentences and does not appear short of breath  Cardiovascular: No lower extremity edema, non tender, no erythema  Skin: Warm dry intact with no signs of infection or rash on extremities or on axial skeleton.  Abdomen: Soft nontender  Neuro: Cranial nerves II through XII are intact, neurovascularly intact in all extremities with 2+ DTRs and 2+ pulses.  Lymph: No lymphadenopathy of posterior or anterior cervical chain or axillae bilaterally.  Gait normal with good balance and coordination.  MSK:  Non tender with full range of motion and  good stability and symmetric strength and tone of shoulders, elbows,  hip, knee and ankles bilaterally.  Right leg shows the patient does have a some tenderness over the phone mostly at the PIP and DIP joints not as much of the [facility] joint.  Negative grind test. Patient with active range of motion has limited flexion of the thumb but patient does have flexion of the DIP.  Neurovascular intact distally.  Limited musculoskeletal ultrasound was performed and interpreted by Lyndal Pulley  The sound of patient's right thumb shows a patient of the DIP may have a very small partial avulsion fracture noted.  Flexor tendon sheath appears to be in tact but patient does have increasing Doppler flow. Impression: Small flexor tendon sheath tear with increased vascularity and mild avulsion fracture   Impression and Recommendations:     This case required medical decision making of moderate complexity. The above documentation has been reviewed and is accurate and complete Lyndal Pulley, DO       Note: This dictation was prepared with Dragon dictation along with smaller phrase technology. Any transcriptional errors that result from this process are unintentional.

## 2018-07-09 NOTE — Patient Instructions (Signed)
Good to see you  Wear brace day and night for 2 weeks then nightly for 2 weeks Restart the once weekly vitamin D for 12 weeks Turmeric 500mg  daily  Tart cherry extract any dose at night  See me again in 4 weeks

## 2018-07-09 NOTE — Assessment & Plan Note (Signed)
Patient is a small avulsion fracture of the left thumb.  This does cause some weakness with the flexion.  At this point we will put patient in a thumb spica.  I do think that patient will do well though.  Patient's flexor tendon is intact because when isolating the DIP patient is able to flex.  We discussed once weekly vitamin D to aid in healing of the bone.  Discussed icing regimen and home exercises.  Follow-up again in 4 weeks to further evaluate

## 2018-07-16 ENCOUNTER — Other Ambulatory Visit: Payer: Self-pay

## 2018-07-16 NOTE — Patient Outreach (Addendum)
[facility]) Care Management  07/16/2018  ROSE-MARIE HICKLING 03/17/1950 [id number]   Received staff message from [place] on 07-07-2018 requesting RN CM to follow up with member due to Complex CM Heart Disease; MI.  PMH: CAD; PVC; OSA right hip; Essential HTN; HLD; Depression; Obesity; Insomnia; Anxiety.  Called member today on preferred number; however, no answer. HIPPA compliant voicemail message left. Called secondary number and HIPPA compliant voice mail message left.   Sent [employer] Unsuccessful Outreach Letter. Will await return call from member. Will call member in 3-4 business days.  Benjamine Mola "ANN" Josiah Lobo, RN-BSN  [facility] Care Management  Community Care Management Coordinator  [phone] [email or web address]

## 2018-07-16 NOTE — Patient Outreach (Addendum)
Courtney [name] Courtney [name]) Care Management  07/16/2018  Courtney [name] Mar 05, 1950 [id number]   Received return call from member and identity confirmed. Introduced self and [facility] benefits explained.  Member verified her appointments and confirmed last PCP visit 07-09-2018 and she has follow up appointment on August 05, 2018. Member stated next PCP appointment in October 2020 is with Courtney [name], [place] as Courtney [name], current [name] will no longer be available. Member stated she visits with Courtney [name] for [facility] for Weight Management Program and stated next appointment July 27, 2018. Member confirmed her Cardiology appointment with Courtney [name] in September 2020.  Transportation: Member states she is able to drive herself to appointments and denies needs.  Medications: Member states she has all her medications and denies any needs.  Food: Member denies any food insecurities and states she has food.  COVID-19: Member states signs and symptoms are cough, SOB, fever; states she has a mask, practices social distancing, and proper handwashing; limits outdoors to grocery shopping and the garden [name]; if notice signs and symptoms, she will go to her my chart and schedule and E-visit with Provider.   Member declines [name] services: Member stated she is able to provide care for herself and denies need at this time for [name]. Member made aware of available services if needed and member verbalized understanding.   Called member's PCP office and spoke to Courtney [name] concerning unable to fax to Courtney Chestnut, [name] and fax number provided for Courtney [name] [phone].  Plan: Will not open case at this time. Will send MD closure letter. Will send brochure to member as member agreed. RN Community CM available if [name] services need at later time.  Courtney [name] "Courtney" Josiah Lobo, RN-BSN  [facility] Care Management  Community Care Management Coordinator   [phone] [email or web address]

## 2018-07-20 ENCOUNTER — Ambulatory Visit: Payer: Medicare HMO

## 2018-07-22 ENCOUNTER — Ambulatory Visit: Payer: Self-pay

## 2018-07-27 ENCOUNTER — Other Ambulatory Visit: Payer: Self-pay

## 2018-07-27 ENCOUNTER — Ambulatory Visit (INDEPENDENT_AMBULATORY_CARE_PROVIDER_SITE_OTHER): Payer: Medicare HMO | Admitting: Family Medicine

## 2018-07-27 DIAGNOSIS — E559 Vitamin D deficiency, unspecified: Secondary | ICD-10-CM

## 2018-07-27 DIAGNOSIS — E669 Obesity, unspecified: Secondary | ICD-10-CM | POA: Diagnosis not present

## 2018-07-27 DIAGNOSIS — E66811 Obesity, class 1: Secondary | ICD-10-CM

## 2018-07-27 DIAGNOSIS — Z6830 Body mass index (BMI) 30.0-30.9, adult: Secondary | ICD-10-CM | POA: Diagnosis not present

## 2018-07-28 MED ORDER — VITAMIN D (ERGOCALCIFEROL) 1.25 MG (50000 UNIT) PO CAPS: 50000.0000 [IU] | ORAL_CAPSULE | ORAL | 0 refills | Status: DC

## 2018-07-28 NOTE — Progress Notes (Signed)
 Office: [phone]  /  Fax: [phone] TeleHealth Visit:  Neelam O Robenson has verbally consented to this TeleHealth visit today. The patient is located at home, the provider is located at the [facility] office. The participants in this visit include the listed provider and patient. Manette was unable to use realtime audiovisual technology today and the telehealth visit was conducted via telephone.   HPI:   Chief Complaint: OBESITY Angelette is here to discuss her progress with her obesity treatment plan. She is on the keep a food journal with 1100 calories and 75 grams of protein daily and is following her eating plan approximately 90 % of the time. She states she is doing yard work and walking 1 time per week. Myelle feels she is doing well maintaining her weight over the last 3 months. She has been doing a lot of heavy yard work for activity. She is still mostly following Category 1 plan.  We were unable to weigh the patient today for this TeleHealth visit. She feels as if she has gained 2 lbs since her last visit. She has lost 17 lbs since starting treatment with us.  Vitamin D Deficiency Giovanna has a diagnosis of vitamin D deficiency. She is stable on prescription Vit D and last level was at goal. She denies nausea, vomiting or muscle weakness.  ASSESSMENT AND PLAN:  Vitamin D deficiency - Plan: Vitamin D, Ergocalciferol, (DRISDOL) 1.25 MG (50000 UT) CAPS capsule  Class 1 obesity with serious comorbidity and body mass index (BMI) of 30.0 to 30.9 in adult, unspecified obesity type - Starting BMI greater then 30  PLAN:  Vitamin D Deficiency Evangelia was informed that low vitamin D levels contributes to fatigue and are associated with obesity, breast, and colon cancer. Rozella agrees to continue taking prescription Vit D @50,000 IU every week #4 and we will refill for 1 month. She will follow up for routine testing of vitamin D, at least 2-3 times per year. She was informed of the  risk of over-replacement of vitamin D and agrees to not increase her dose unless she discusses this with us first. Oneida agrees to follow up with our clinic in 3 weeks.  I spent > than 50% of the 25 minute visit on counseling as documented in the note.  Obesity Elysha is currently in the action stage of change. As such, her goal is to continue with weight loss efforts She has agreed to follow the Category 1 plan Eran has been instructed to work up to a goal of 150 minutes of combined cardio and strengthening exercise per week or as is for weight loss and overall health benefits. We discussed the following Behavioral Modification Strategies today: increasing lean protein intake and work on meal planning and easy cooking plans, and planning for success   Naama has agreed to follow up with our clinic in 3 weeks. She was informed of the importance of frequent follow up visits to maximize her success with intensive lifestyle modifications for her multiple health conditions.  ALLERGIES: Allergies  Allergen Reactions  . Erythromycin Hives  . Penicillins Hives    Has patient had a PCN reaction causing immediate rash, facial/tongue/throat swelling, SOB or lightheadedness with hypotension: No Has patient had a PCN reaction causing severe rash involving mucus membranes or skin necrosis: No Has patient had a PCN reaction that required hospitalization: No Has patient had a PCN reaction occurring within the last 10 years: No If all of the above answers are "  NO", then may proceed with Cephalosporin use.   . Shellfish Allergy Hives    MEDICATIONS: Current Outpatient Medications on File Prior to Visit  Medication Sig Dispense Refill  . acetaminophen (TYLENOL) 500 MG tablet Take 1,000 mg by mouth every 6 (six) hours as needed (Pain).     . Alpha-D-Galactosidase (BEANO) TABS Take 1 tablet by mouth daily.    . aspirin 81 MG tablet Take 81 mg by mouth daily.    . diclofenac sodium (VOLTAREN) 1 % GEL  Apply 2 g topically 4 (four) times daily. Rub into affected area of foot 2 to 4 times daily 100 g 2  . Docusate Calcium (STOOL SOFTENER PO) Take 1 tablet by mouth daily as needed for constipation.    . ezetimibe (ZETIA) 10 MG tablet Take 1 tablet (10 mg total) by mouth daily. 90 tablet 3  . metoprolol tartrate (LOPRESSOR) 25 MG tablet Take 0.5 tablets (12.5 mg total) by mouth 2 (two) times daily. 90 tablet 3  . MIRALAX powder Take 17 g by mouth daily as needed for constipation.    . nitroGLYCERIN (NITROSTAT) 0.4 MG SL tablet Place 1 tablet (0.4 mg total) under the tongue every 5 (five) minutes as needed for chest pain. 25 tablet 3  . omeprazole (PRILOSEC) 40 MG capsule Take 1 capsule (40 mg total) by mouth daily. 90 capsule 3  . ramipril (ALTACE) 5 MG capsule Take 1 capsule (5 mg total) by mouth daily. 90 capsule 3  . rosuvastatin (CRESTOR) 40 MG tablet Take 1 tablet (40 mg total) by mouth daily. 90 tablet 3   No current facility-administered medications on file prior to visit.     PAST MEDICAL HISTORY: Past Medical History:  Diagnosis Date  . Aortic valve sclerosis    echo 12/2008, soft systolic murmur  . 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 Back pain   . Carotid artery disease (HCC)    40-59% bilateral, doppler December 2010  . Constipation   . Coronary artery disease    a. BMS-mid RCA 11/2007 b. stress echo 02/2009 subtle inferior HK, lateral ST depressions with stress c. follow-up cath 02/2009: RCA stent patent, severe but stable stenosis of distal posterior lateral branch RCA. LV normal, med tx unless more sx c. ETT Myoview 08/27/12: negative for ischemia; EF 64%  . Decreased hearing   . Depression    Mild, August, 2012  . Dyslipidemia   . Easy bruising   . Ejection fraction    EF normal, catheterization, 2011 /  EF 55%, echo, 2010  . Fluid overload    Mild, stable since hospitalization in the past  . GERD (gastroesophageal reflux disease)    occarionally, takes Pepcid over the counter   . Heart murmur    has, occasional PVC's  . History of right hip replacement   . Hyperlipidemia   . Hypertension   . Joint pain   . Myocardial infarction (HCC)    2009  . Palpitations   . PVC (premature ventricular contraction)    per prior Holter monitor  . Right hip pain 12/2009  . Shellfish allergy   . Trouble in sleeping   . Vitamin B12 deficiency   . Vitamin D deficiency     PAST SURGICAL HISTORY: Past Surgical History:  Procedure Laterality Date  . 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/01/2005  . carcinoid tumor removal    . CARDIAC CATHETERIZATION  1/211   Patent RCA stent, stenotic PLB supplying small distribution; medically managed  .   CORONARY ANGIOPLASTY WITH STENT PLACEMENT  11/2007   BMS-mid RCA  . DIAGNOSTIC LAPAROSCOPY     1982 for endometriosis  . DILATION AND CURETTAGE OF UTERUS  2005  . hx of wisdom teeth surgery    . SKIN CANCER EXCISION    . TOTAL HIP ARTHROPLASTY Right 07/08/2014   Procedure: RIGHT TOTAL HIP ARTHROPLASTY ANTERIOR APPROACH;  Surgeon: Mcarthur Rossetti, MD;  Location: [facility];  Service: Orthopedics;  Laterality: Right;    SOCIAL HISTORY: Social History   Tobacco Use  . Smoking status: Never Smoker  . Smokeless tobacco: Never Used  Substance Use Topics  . Alcohol use: Yes    Alcohol/week: 7.0 standard drinks    Types: 7 Glasses of wine per week    Comment: 1 glass of wine/night  . Drug use: No    FAMILY HISTORY: Family History  Problem Relation Age of Onset  . Heart attack Father 27  . Hyperlipidemia Father   . Hypertension Father   . Sudden death Father   . Stroke Mother   . Hyperlipidemia Mother   . Hypertension Mother   . Heart disease Mother   . Obesity Mother   . Heart attack Paternal Grandfather   . Heart disease Sister   . BRCA 1/2 Neg Hx   . Breast cancer Neg Hx     ROS: Review of Systems  Constitutional: Negative for weight loss.  Gastrointestinal: Negative for nausea and vomiting.  Musculoskeletal:       Negative  muscle weakness    PHYSICAL EXAM: Pt in no acute distress  RECENT LABS AND TESTS: BMET    Component Value Date/Time   NA 143 03/03/2018 1116   K 4.4 03/03/2018 1116   CL 103 03/03/2018 1116   CO2 23 03/03/2018 1116   GLUCOSE 89 03/03/2018 1116   GLUCOSE 92 10/14/2017 1923   BUN 22 03/03/2018 1116   CREATININE 0.85 03/03/2018 1116   CALCIUM 9.7 03/03/2018 1116   GFRNONAA 70 03/03/2018 1116   GFRAA 81 03/03/2018 1116   Lab Results  Component Value Date   HGBA1C 5.6 03/03/2018   HGBA1C 5.6 10/15/2017   HGBA1C 5.6 06/16/2017   Lab Results  Component Value Date   INSULIN 5.0 03/03/2018   INSULIN 5.9 10/15/2017   INSULIN 6.4 06/16/2017   CBC    Component Value Date/Time   WBC 7.4 03/03/2018 1116   WBC 12.2 (H) 10/14/2017 1923   RBC 4.20 03/03/2018 1116   RBC 4.21 10/14/2017 1923   HGB 13.2 03/03/2018 1116   HCT 39.3 03/03/2018 1116   PLT 235 10/14/2017 1923   MCV 94 03/03/2018 1116   MCH 31.4 03/03/2018 1116   MCH 31.8 10/14/2017 1923   MCHC 33.6 03/03/2018 1116   MCHC 33.8 10/14/2017 1923   RDW 11.9 03/03/2018 1116   LYMPHSABS 3.0 03/03/2018 1116   MONOABS 0.8 10/14/2017 1923   EOSABS 0.4 03/03/2018 1116   BASOSABS 0.1 03/03/2018 1116   Iron/TIBC/Ferritin/ %Sat    Component Value Date/Time   IRON 62 01/09/2015 0806   FERRITIN 26.6 01/09/2015 0806   IRONPCTSAT 15.2 (L) 01/09/2015 0806   Lipid Panel     Component Value Date/Time   CHOL 159 03/03/2018 1116   TRIG 105 03/03/2018 1116   HDL 69 03/03/2018 1116   CHOLHDL 2.5 10/29/2016 1030   CHOLHDL 3 11/30/2015 1028   VLDL 25.4 11/30/2015 1028   LDLCALC 69 03/03/2018 1116   Hepatic Function Panel     Component Value  Date/Time   PROT 6.7 03/03/2018 1116   ALBUMIN 4.5 03/03/2018 1116   AST 22 03/03/2018 1116   ALT 18 03/03/2018 1116   ALKPHOS 75 03/03/2018 1116   BILITOT 0.3 03/03/2018 1116   BILIDIR 0.07 10/29/2016 1030      Component Value Date/Time   TSH 0.931 06/16/2017 0000   TSH 1.43  01/09/2015 0806   TSH 0.876 08/26/2012 1722      I, Trixie Dredge, am acting as transcriptionist for Dennard Nip, MD I have reviewed the above documentation for accuracy and completeness, and I agree with the above. -Dennard Nip, MD

## 2018-08-04 ENCOUNTER — Encounter (INDEPENDENT_AMBULATORY_CARE_PROVIDER_SITE_OTHER): Payer: Self-pay | Admitting: Family Medicine

## 2018-08-04 DIAGNOSIS — E559 Vitamin D deficiency, unspecified: Secondary | ICD-10-CM | POA: Insufficient documentation

## 2018-08-04 NOTE — Progress Notes (Signed)
[name] [address] [place], [place] [address] Phone: [phone] Subjective:   I Courtney [name] am serving as a Education administrator for Courtney [name].    CC: Right thumb pain follow-up  [id number]   07/09/2018 Patient is a small avulsion fracture of the left thumb.  This does cause some weakness with the flexion.  At this point we will put patient in a thumb spica.  I do think that patient will do well though.  Patient's flexor tendon is intact because when isolating the DIP patient is able to flex.  We discussed once weekly vitamin D to aid in healing of the bone.  Discussed icing regimen and home exercises.  Follow-up again in 4 weeks to further evaluate  08/05/2018 Courtney [name] is a 68 y.o. female coming in with complaint of right thumb pain. States that her thumb is doing better. Not 100%.  Patient would state that she is approximately 90% better.  Still notices some discomfort when she tries to grab something or hits the top of her tongue.  Denies any numbness or tingling.     Past Medical History:  Diagnosis Date  . Aortic valve sclerosis    echo 56/3875, soft systolic murmur  . 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 Back pain   . Carotid artery disease ([place])    40-59% bilateral, doppler December 2010  . Constipation   . Coronary artery disease    a. BMS-mid RCA 11/2007 b. stress echo 02/2009 subtle inferior HK, lateral ST depressions with stress c. follow-up cath 02/2009: RCA stent patent, severe but stable stenosis of distal posterior lateral branch RCA. LV normal, med tx unless more sx c. ETT Myoview 08/27/12: negative for ischemia; EF 64%  . Decreased hearing   . Depression    Mild, August, 2012  . Dyslipidemia   . Easy bruising   . Ejection fraction    EF normal, catheterization, 2011 /  EF 55%, echo, 2010  . Fluid overload    Mild, stable since hospitalization in the past  . GERD (gastroesophageal reflux disease)    occarionally, takes Pepcid  over the counter  . Heart murmur    has, occasional PVC's  . History of right hip replacement   . Hyperlipidemia   . Hypertension   . Joint pain   . Myocardial infarction ([place])    2009  . Palpitations   . PVC (premature ventricular contraction)    per prior Holter monitor  . Right hip pain 12/2009  . Shellfish allergy   . Trouble in sleeping   . Vitamin B12 deficiency   . Vitamin D deficiency    Past Surgical History:  Procedure Laterality Date  . 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/01/2005  . carcinoid tumor removal    . CARDIAC CATHETERIZATION  1/211   Patent RCA stent, stenotic PLB supplying small distribution; medically managed  . CORONARY ANGIOPLASTY WITH STENT PLACEMENT  11/2007   BMS-mid RCA  . DIAGNOSTIC LAPAROSCOPY     1982 for endometriosis  . DILATION AND CURETTAGE OF UTERUS  2005  . hx of wisdom teeth surgery    . SKIN CANCER EXCISION    . TOTAL HIP ARTHROPLASTY Right 07/08/2014   Procedure: RIGHT TOTAL HIP ARTHROPLASTY ANTERIOR APPROACH;  Surgeon: Mcarthur Rossetti, MD;  Location: [facility];  Service: Orthopedics;  Laterality: Right;   Social History   Socioeconomic History  . Marital status: Divorced    Spouse name: Not  on file  . Number of children: 1  . Years of education: 37  . Highest education level: Not on file  Occupational History  . Occupation: Retired - Retail buyer  Social Needs  . Financial resource strain: Not hard at all  . Food insecurity    Worry: Never true    Inability: Never true  . Transportation needs    Medical: No    Non-medical: No  Tobacco Use  . Smoking status: Never Smoker  . Smokeless tobacco: Never Used  Substance and Sexual Activity  . Alcohol use: Yes    Alcohol/week: 7.0 standard drinks    Types: 7 Glasses of wine per week    Comment: 1 glass of wine/night  . Drug use: No  . Sexual activity: Yes  Lifestyle  . Physical activity    Days per week: 3 days    Minutes per session: 30 min  . Stress: Not at all   Relationships  . Social connections    Talks on phone: More than three times a week    Gets together: More than three times a week    Attends religious service: Not on file    Active member of club or organization: Not on file    Attends meetings of clubs or organizations: Not on file    Relationship status: Living with partner  Other Topics Concern  . Not on file  Social History Narrative   Fun: Dance, garden,    Denies religious beliefs effecting health care.   Denies abuse and feels safe at home   Allergies  Allergen Reactions  . Erythromycin Hives  . Penicillins Hives    Has patient had a PCN reaction causing immediate rash, facial/tongue/throat swelling, SOB or lightheadedness with hypotension: No Has patient had a PCN reaction causing severe rash involving mucus membranes or skin necrosis: No Has patient had a PCN reaction that required hospitalization: No Has patient had a PCN reaction occurring within the last 10 years: No If all of the above answers are "NO", then may proceed with Cephalosporin use.   . Shellfish Allergy Hives   Family History  Problem Relation Age of Onset  . Heart attack Father 52  . Hyperlipidemia Father   . Hypertension Father   . Sudden death Father   . Stroke Mother   . Hyperlipidemia Mother   . Hypertension Mother   . Heart disease Mother   . Obesity Mother   . Heart attack Paternal Grandfather   . Heart disease Sister   . BRCA 1/2 Neg Hx   . Breast cancer Neg Hx      Current Outpatient Medications (Cardiovascular):  .  ezetimibe (ZETIA) 10 MG tablet, Take 1 tablet (10 mg total) by mouth daily. .  metoprolol tartrate (LOPRESSOR) 25 MG tablet, Take 0.5 tablets (12.5 mg total) by mouth 2 (two) times daily. .  nitroGLYCERIN (NITROSTAT) 0.4 MG SL tablet, Place 1 tablet (0.4 mg total) under the tongue every 5 (five) minutes as needed for chest pain. .  ramipril (ALTACE) 5 MG capsule, Take 1 capsule (5 mg total) by mouth daily. .   rosuvastatin (CRESTOR) 40 MG tablet, Take 1 tablet (40 mg total) by mouth daily.   Current Outpatient Medications (Analgesics):  .  acetaminophen (TYLENOL) 500 MG tablet, Take 1,000 mg by mouth every 6 (six) hours as needed (Pain).  [name]  aspirin 81 MG tablet, Take 81 mg by mouth daily.   Current Outpatient Medications (Other):  .  diclofenac  sodium (VOLTAREN) 1 % GEL, Apply 2 g topically 4 (four) times daily. Rub into affected area of foot 2 to 4 times daily .  omeprazole (PRILOSEC) 40 MG capsule, Take 1 capsule (40 mg total) by mouth daily. .  Vitamin D, Ergocalciferol, (DRISDOL) 1.25 MG (50000 UT) CAPS capsule, Take 1 capsule (50,000 Units total) by mouth every 7 (seven) days.    Past medical history, social, surgical and family history all reviewed in electronic medical record.  No pertanent information unless stated regarding to the chief complaint.   Review of Systems:  No headache, visual changes, nausea, vomiting, diarrhea, constipation, dizziness, abdominal pain, skin rash, fevers, chills, night sweats, weight loss, swollen lymph nodes, body aches, joint swelling, chest pain, shortness of breath, mood changes.  Positive muscle aches  Objective  Blood pressure (!) 142/80, pulse 61, height 5' (1.524 m), weight 145 lb (65.8 kg), SpO2 92 %.   General: No apparent distress alert and oriented x3 mood and affect normal, dressed appropriately.  HEENT: Pupils equal, extraocular movements intact  Respiratory: Patient's speak in full sentences and does not appear short of breath  Cardiovascular: No lower extremity edema, non tender, no erythema  Skin: Warm dry intact with no signs of infection or rash on extremities or on axial skeleton.  Abdomen: Soft nontender  Neuro: Cranial nerves II through XII are intact, neurovascularly intact in all extremities with 2+ DTRs and 2+ pulses.  Lymph: No lymphadenopathy of posterior or anterior cervical chain or axillae bilaterally.  Gait normal with  good balance and coordination.  MSK:  tender with full range of motion and good stability and symmetric strength and tone of shoulders, elbows,   and ankles bilaterally.  Mild arthritic changes of multiple joints  Right thumbexam shows the patient does have some mild tenderness still over the flexor tendon in the IP joint where the area of the avulsion was to direct pressure.  Improvement in strength.  Full range of motion of the fingers pain.  No pain over the scaphoid   Limited musculoskeletal ultrasound was performed and interpreted by Courtney [name]  Area of the avulsion noted previously seen with concentration does show the patient does have good bone healing.  Seems to have significant reabsorption at this moment. Impression: Interval healing of avulsion fracture    Impression and Recommendations:     The above documentation has been reviewed and is accurate and complete Courtney Pulley, DO       Note: This dictation was prepared with Dragon dictation along with smaller phrase technology. Any transcriptional errors that result from this process are unintentional.

## 2018-08-05 ENCOUNTER — Encounter: Payer: Self-pay | Admitting: Family Medicine

## 2018-08-05 ENCOUNTER — Ambulatory Visit: Payer: Medicare HMO | Admitting: Family Medicine

## 2018-08-05 ENCOUNTER — Other Ambulatory Visit: Payer: Self-pay

## 2018-08-05 ENCOUNTER — Ambulatory Visit: Payer: Self-pay

## 2018-08-05 VITALS — BP 142/80 | HR 61 | Ht 60.0 in | Wt 145.0 lb

## 2018-08-05 DIAGNOSIS — M79644 Pain in right finger(s): Secondary | ICD-10-CM | POA: Diagnosis not present

## 2018-08-05 DIAGNOSIS — S62501D Fracture of unspecified phalanx of right thumb, subsequent encounter for fracture with routine healing: Secondary | ICD-10-CM | POA: Diagnosis not present

## 2018-08-05 NOTE — Assessment & Plan Note (Signed)
Patient doing significantly better at this time.  Increase her activity as tolerated.  Can wear the brace still at night if needed.  Follow-up with me again 4 weeks if not completely resolved.

## 2018-08-05 NOTE — Patient Instructions (Signed)
Good to see you.  Exercises 3 times a week.  Looks like a [place] See me again in 4 weeks if back giving you trouble

## 2018-08-10 ENCOUNTER — Other Ambulatory Visit: Payer: Self-pay

## 2018-08-10 ENCOUNTER — Encounter (INDEPENDENT_AMBULATORY_CARE_PROVIDER_SITE_OTHER): Payer: Self-pay | Admitting: Family Medicine

## 2018-08-10 ENCOUNTER — Ambulatory Visit (INDEPENDENT_AMBULATORY_CARE_PROVIDER_SITE_OTHER): Payer: Medicare HMO | Admitting: Family Medicine

## 2018-08-10 DIAGNOSIS — E669 Obesity, unspecified: Secondary | ICD-10-CM | POA: Diagnosis not present

## 2018-08-10 DIAGNOSIS — I10 Essential (primary) hypertension: Secondary | ICD-10-CM

## 2018-08-10 DIAGNOSIS — E559 Vitamin D deficiency, unspecified: Secondary | ICD-10-CM | POA: Diagnosis not present

## 2018-08-10 DIAGNOSIS — Z6830 Body mass index (BMI) 30.0-30.9, adult: Secondary | ICD-10-CM

## 2018-08-11 MED ORDER — VITAMIN D (ERGOCALCIFEROL) 1.25 MG (50000 UNIT) PO CAPS: 50000.0000 [IU] | ORAL_CAPSULE | ORAL | 0 refills | Status: DC

## 2018-08-11 NOTE — Progress Notes (Signed)
Office: [phone]  /  Fax: [phone] TeleHealth Visit:  Courtney [name] has verbally consented to this TeleHealth visit today. The patient is located at home, the provider is located at the [name]. The participants in this visit include the listed provider and patient . Courtney [name] was unable to use realtime audiovisual technology today and the telehealth visit was conducted via telephone.   HPI:   Chief Complaint: OBESITY Courtney [name] is here to discuss her progress with her obesity treatment plan. She is on the  follow the Category 1 plan and is following her eating plan approximately 85 % of the time. She states she is exercising by doing yardwork 120 minutes 5 times per week. [name] continues to do well on her Category 1 plan but I sup 2 lbs. She is struggling more with cravings and emotional eating. She has eaten out more recently as well.  We were unable to weigh the patient today for this TeleHealth visit. She feels as if she has gained weight since her last visit. She has lost 17 lbs since starting treatment with [place].  Vitamin D deficiency Courtney [name] has a diagnosis of vitamin D deficiency. She is currently taking vit D and denies nausea, vomiting or muscle weakness. She is due for labs soon.   Hypertension Courtney [name] is a 68 y.o. female with hypertension.  Courtney [name] denies chest pain, headache, dizziness, or shortness of breath on exertion. She is working weight loss to help control her blood pressure with the goal of decreasing her risk of heart attack and stroke. Courtney [name] blood pressure is stable on medications she does not check her blood pressure.   ASSESSMENT AND PLAN:  Vitamin D deficiency - Plan: Vitamin D, Ergocalciferol, (DRISDOL) 1.25 MG (50000 UT) CAPS capsule  Essential hypertension  Class 1 obesity with serious comorbidity and body mass index (BMI) of 30.0 to 30.9 in adult, unspecified obesity type - Starting BMI greater then 30  PLAN: Vitamin D  Deficiency Courtney [name] was informed that low vitamin D levels contributes to fatigue and are associated with obesity, breast, and colon cancer. She agrees to continue to take prescription Vit D '@50'$ ,000 IU every week #4 with no refuills and will follow up for routine testing of vitamin D, at least 2-3 times per year. She was informed of the risk of over-replacement of vitamin D and agrees to not increase her dose unless she discusses this with [place] first. Agrees to follow up with our clinic as directed. We will repeat labs next month.   Hypertension We discussed sodium restriction, working on healthy weight loss, and a regular exercise program as the means to achieve improved blood pressure control. Courtney [name] agreed with this plan and agreed to follow up as directed. We will continue to monitor her blood pressure as well as her progress with the above lifestyle modifications. She will continue her medications as prescribed and will watch for signs of hypotension as she continues her lifestyle modifications. We will repeat labs next month.   I spent > than 50% of the 25 minute visit on counseling as documented in the note.   Obesity Courtney [name] is currently in the action stage of change. As such, her goal is to continue with weight loss efforts She has agreed to follow the Category 1 plan Courtney [name] has been instructed to work up to a goal of 150 minutes of combined cardio and strengthening exercise per week for weight loss and overall health benefits. We discussed  the following Behavioral Modification Stratagies today: increasing water intake, increasing lean protein intake, decreasing sodium intake, decrease eating out and emotional eating strategies   Courtney [name] has agreed to follow up with our clinic in 2 weeks. She was informed of the importance of frequent follow up visits to maximize her success with intensive lifestyle modifications for her multiple health conditions.  ALLERGIES: Allergies  Allergen Reactions  .  Erythromycin Hives  . Penicillins Hives    Has patient had a PCN reaction causing immediate rash, facial/tongue/throat swelling, SOB or lightheadedness with hypotension: No Has patient had a PCN reaction causing severe rash involving mucus membranes or skin necrosis: No Has patient had a PCN reaction that required hospitalization: No Has patient had a PCN reaction occurring within the last 10 years: No If all of the above answers are "NO", then may proceed with Cephalosporin use.   [name] Shellfish Allergy Hives    MEDICATIONS: Current Outpatient Medications on File Prior to Visit  Medication Sig Dispense Refill  . acetaminophen (TYLENOL) 500 MG tablet Take 1,000 mg by mouth every 6 (six) hours as needed (Pain).     [name] aspirin 81 MG tablet Take 81 mg by mouth daily.    . diclofenac sodium (VOLTAREN) 1 % GEL Apply 2 g topically 4 (four) times daily. Rub into affected area of foot 2 to 4 times daily 100 g 2  . ezetimibe (ZETIA) 10 MG tablet Take 1 tablet (10 mg total) by mouth daily. 90 tablet 3  . metoprolol tartrate (LOPRESSOR) 25 MG tablet Take 0.5 tablets (12.5 mg total) by mouth 2 (two) times daily. 90 tablet 3  . nitroGLYCERIN (NITROSTAT) 0.4 MG SL tablet Place 1 tablet (0.4 mg total) under the tongue every 5 (five) minutes as needed for chest pain. 25 tablet 3  . omeprazole (PRILOSEC) 40 MG capsule Take 1 capsule (40 mg total) by mouth daily. 90 capsule 3  . ramipril (ALTACE) 5 MG capsule Take 1 capsule (5 mg total) by mouth daily. 90 capsule 3  . rosuvastatin (CRESTOR) 40 MG tablet Take 1 tablet (40 mg total) by mouth daily. 90 tablet 3   No current facility-administered medications on file prior to visit.     PAST MEDICAL HISTORY: Past Medical History:  Diagnosis Date  . Aortic valve sclerosis    echo 62/5638, soft systolic murmur  . 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 Back pain   . Carotid artery disease ([place])    40-59% bilateral, doppler December 2010  . Constipation   . Coronary  artery disease    a. BMS-mid RCA 11/2007 b. stress echo 02/2009 subtle inferior HK, lateral ST depressions with stress c. follow-up cath 02/2009: RCA stent patent, severe but stable stenosis of distal posterior lateral branch RCA. LV normal, med tx unless more sx c. ETT Myoview 08/27/12: negative for ischemia; EF 64%  . Decreased hearing   . Depression    Mild, August, 2012  . Dyslipidemia   . Easy bruising   . Ejection fraction    EF normal, catheterization, 2011 /  EF 55%, echo, 2010  . Fluid overload    Mild, stable since hospitalization in the past  . GERD (gastroesophageal reflux disease)    occarionally, takes Pepcid over the counter  . Heart murmur    has, occasional PVC's  . History of right hip replacement   . Hyperlipidemia   . Hypertension   . Joint pain   . Myocardial infarction ([place])  2009  . Palpitations   . PVC (premature ventricular contraction)    per prior Holter monitor  . Right hip pain 12/2009  . Shellfish allergy   . Trouble in sleeping   . Vitamin B12 deficiency   . Vitamin D deficiency     PAST SURGICAL HISTORY: Past Surgical History:  Procedure Laterality Date  . 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/01/2005  . carcinoid tumor removal    . CARDIAC CATHETERIZATION  1/211   Patent RCA stent, stenotic PLB supplying small distribution; medically managed  . CORONARY ANGIOPLASTY WITH STENT PLACEMENT  11/2007   BMS-mid RCA  . DIAGNOSTIC LAPAROSCOPY     1982 for endometriosis  . DILATION AND CURETTAGE OF UTERUS  2005  . hx of wisdom teeth surgery    . SKIN CANCER EXCISION    . TOTAL HIP ARTHROPLASTY Right 07/08/2014   Procedure: RIGHT TOTAL HIP ARTHROPLASTY ANTERIOR APPROACH;  Surgeon: Mcarthur Rossetti, MD;  Location: [facility];  Service: Orthopedics;  Laterality: Right;    SOCIAL HISTORY: Social History   Tobacco Use  . Smoking status: Never Smoker  . Smokeless tobacco: Never Used  Substance Use Topics  . Alcohol use: Yes    Alcohol/week: 7.0 standard  drinks    Types: 7 Glasses of wine per week    Comment: 1 glass of wine/night  . Drug use: No    FAMILY HISTORY: Family History  Problem Relation Age of Onset  . Heart attack Father 73  . Hyperlipidemia Father   . Hypertension Father   . Sudden death Father   . Stroke Mother   . Hyperlipidemia Mother   . Hypertension Mother   . Heart disease Mother   . Obesity Mother   . Heart attack Paternal Grandfather   . Heart disease Sister   . BRCA 1/2 Neg Hx   . Breast cancer Neg Hx     ROS: Review of Systems  Respiratory: Negative for shortness of breath.   Cardiovascular: Negative for chest pain.  Gastrointestinal: Negative for nausea and vomiting.  Musculoskeletal:       Negative for muscle weakness  Neurological: Negative for dizziness and headaches.    PHYSICAL EXAM: Pt in no acute distress  RECENT LABS AND TESTS: BMET    Component Value Date/Time   NA 143 03/03/2018 1116   K 4.4 03/03/2018 1116   CL 103 03/03/2018 1116   CO2 23 03/03/2018 1116   GLUCOSE 89 03/03/2018 1116   GLUCOSE 92 10/14/2017 1923   BUN 22 03/03/2018 1116   CREATININE 0.85 03/03/2018 1116   CALCIUM 9.7 03/03/2018 1116   GFRNONAA 70 03/03/2018 1116   GFRAA 81 03/03/2018 1116   Lab Results  Component Value Date   HGBA1C 5.6 03/03/2018   HGBA1C 5.6 10/15/2017   HGBA1C 5.6 06/16/2017   Lab Results  Component Value Date   INSULIN 5.0 03/03/2018   INSULIN 5.9 10/15/2017   INSULIN 6.4 06/16/2017   CBC    Component Value Date/Time   WBC 7.4 03/03/2018 1116   WBC 12.2 (H) 10/14/2017 1923   RBC 4.20 03/03/2018 1116   RBC 4.21 10/14/2017 1923   HGB 13.2 03/03/2018 1116   HCT 39.3 03/03/2018 1116   PLT 235 10/14/2017 1923   MCV 94 03/03/2018 1116   MCH 31.4 03/03/2018 1116   MCH 31.8 10/14/2017 1923   MCHC 33.6 03/03/2018 1116   MCHC 33.8 10/14/2017 1923   RDW 11.9 03/03/2018 1116   LYMPHSABS 3.0 03/03/2018 1116  MONOABS 0.8 10/14/2017 1923   EOSABS 0.4 03/03/2018 1116    BASOSABS 0.1 03/03/2018 1116   Iron/TIBC/Ferritin/ %Sat    Component Value Date/Time   IRON 62 01/09/2015 0806   FERRITIN 26.6 01/09/2015 0806   IRONPCTSAT 15.2 (L) 01/09/2015 0806   Lipid Panel     Component Value Date/Time   CHOL 159 03/03/2018 1116   TRIG 105 03/03/2018 1116   HDL 69 03/03/2018 1116   CHOLHDL 2.5 10/29/2016 1030   CHOLHDL 3 11/30/2015 1028   VLDL 25.4 11/30/2015 1028   LDLCALC 69 03/03/2018 1116   Hepatic Function Panel     Component Value Date/Time   PROT 6.7 03/03/2018 1116   ALBUMIN 4.5 03/03/2018 1116   AST 22 03/03/2018 1116   ALT 18 03/03/2018 1116   ALKPHOS 75 03/03/2018 1116   BILITOT 0.3 03/03/2018 1116   BILIDIR 0.07 10/29/2016 1030      Component Value Date/Time   TSH 0.931 06/16/2017 0000   TSH 1.43 01/09/2015 0806   TSH 0.876 08/26/2012 1722      I, Renee Ramus, am acting as Location manager for Dennard Nip, MD  I have reviewed the above documentation for accuracy and completeness, and I agree with the above. -Dennard Nip, MD

## 2018-08-14 ENCOUNTER — Telehealth: Payer: Medicare HMO | Admitting: Nurse Practitioner

## 2018-08-14 DIAGNOSIS — L03313 Cellulitis of chest wall: Secondary | ICD-10-CM

## 2018-08-14 MED ORDER — SULFAMETHOXAZOLE-TRIMETHOPRIM 800-160 MG PO TABS: 1.0000 | ORAL_TABLET | Freq: Two times a day (BID) | ORAL | 0 refills | Status: DC

## 2018-08-14 NOTE — Progress Notes (Signed)
E Visit for Cellulitis  We are sorry that you are not feeling well. Here is how we plan to help!  Based on what you shared with me it looks like you have cellulitis.  Cellulitis looks like areas of skin redness, swelling, and warmth; it develops as a result of bacteria entering under the skin. Little red spots and/or bleeding can be seen in skin, and tiny surface sacs containing fluid can occur. Fever can be present. Cellulitis is almost always on one side of a body, and the lower limbs are the most common site of involvement.   I have prescribed:  Bactrim DS 1 tablet by mouth BID for 7 days  HOME CARE:  . Take your medications as ordered and take all of them, even if the skin irritation appears to be healing.   GET HELP RIGHT AWAY IF:  . Symptoms that don't begin to go away within 48 hours. . Severe redness persists or worsens . If the area turns color, spreads or swells. . If it blisters and opens, develops yellow-brown crust or bleeds. . You develop a fever or chills. . If the pain increases or becomes unbearable.  . Are unable to keep fluids and food down.  MAKE SURE YOU    Understand these instructions.  Will watch your condition.  Will get help right away if you are not doing well or get worse.  Thank you for choosing an e-visit. Your e-visit answers were reviewed by a board certified advanced clinical practitioner to complete your personal care plan. Depending upon the condition, your plan could have included both over the counter or prescription medications. Please review your pharmacy choice. Make sure the pharmacy is open so you can pick up prescription now. If there is a problem, you may contact your provider through [employer] and have the prescription routed to another pharmacy. Your safety is important to [place]. If you have drug allergies check your prescription carefully.  For the next 24 hours you can use MyChart to ask questions about today's visit, request a  non-urgent call back, or ask for a work or school excuse. You will get an email in the next two days asking about your experience. I hope that your e-visit has been valuable and will speed your recovery. 5-10 minutes spent reviewing and documenting in chart.

## 2018-08-27 ENCOUNTER — Encounter (INDEPENDENT_AMBULATORY_CARE_PROVIDER_SITE_OTHER): Payer: Self-pay | Admitting: Family Medicine

## 2018-08-27 ENCOUNTER — Other Ambulatory Visit: Payer: Self-pay

## 2018-08-27 ENCOUNTER — Telehealth (INDEPENDENT_AMBULATORY_CARE_PROVIDER_SITE_OTHER): Payer: Medicare HMO | Admitting: Family Medicine

## 2018-08-27 DIAGNOSIS — Z6830 Body mass index (BMI) 30.0-30.9, adult: Secondary | ICD-10-CM | POA: Diagnosis not present

## 2018-08-27 DIAGNOSIS — E669 Obesity, unspecified: Secondary | ICD-10-CM | POA: Diagnosis not present

## 2018-08-27 DIAGNOSIS — E782 Mixed hyperlipidemia: Secondary | ICD-10-CM | POA: Diagnosis not present

## 2018-09-01 NOTE — Progress Notes (Signed)
Office: [phone]  /  Fax: [phone] TeleHealth Visit:  DEWAYNE JUREK has verbally consented to this TeleHealth visit today. The patient is located at home, the provider is located at the [employer] [facility] office. The participants in this visit include the listed provider and patient. Jackline was unable to use realtime audiovisual technology today and the telehealth visit was conducted via telephone.   HPI:   Chief Complaint: OBESITY Cylinda is here to discuss her progress with her obesity treatment plan. She is on the Category 1 plan and is following her eating plan approximately 90 % of the time. She states she is painting and gardening. Deshanae's weight this morning at home was 141 lbs, and she has done well maintaining her weight loss. She is keeping busy doing home improvement projects. She states her hunger is controlled. She has done some celebration eating with her family but has gotten back on track.  We were unable to weigh the patient today for this TeleHealth visit. She feels as if she has maintained her weight since her last visit. She has lost 17 lbs since starting treatment with [place].  Hyperlipidemia (Mixed) Madelaine has hyperlipidemia and she is stable on Zetia and Crestor. She denies any chest pain, claudication or myalgias. She is working on trying to improve her cholesterol levels with intensive lifestyle modification including a low saturated fat diet, exercise and weight loss. She is due for labs soon.  ASSESSMENT AND PLAN:  Mixed hyperlipidemia  Class 1 obesity with serious comorbidity and body mass index (BMI) of 30.0 to 30.9 in adult, unspecified obesity type  PLAN:  Hyperlipidemia (Mixed) Caralina was informed of the American Heart Association Guidelines emphasizing intensive lifestyle modifications as the first line treatment for hyperlipidemia. We discussed many lifestyle modifications today in depth, and Ceniyah will continue to work on decreasing saturated fats  such as fatty red meat, butter and many fried foods. She will also increase vegetables and lean protein in her diet and continue to work on diet, exercise, and weight loss efforts. Therma agrees to continue her medications and we will recheck labs in 2 weeks at her next in office visit.  I spent > than 50% of the 15 minute visit on counseling as documented in the note.  Obesity Emmett is currently in the action stage of change. As such, her goal is to continue with weight loss efforts She has agreed to follow the Category 1 plan [place] has been instructed to work up to a goal of 150 minutes of combined cardio and strengthening exercise per week for weight loss and overall health benefits. We discussed the following Behavioral Modification Strategies today: increasing lean protein intake, decrease eating out and work on meal planning and easy cooking plans   Mishayla has agreed to follow up with our clinic in 2 weeks. She was informed of the importance of frequent follow up visits to maximize her success with intensive lifestyle modifications for her multiple health conditions.  ALLERGIES: Allergies  Allergen Reactions  . Erythromycin Hives  . Penicillins Hives    Has patient had a PCN reaction causing immediate rash, facial/tongue/throat swelling, SOB or lightheadedness with hypotension: No Has patient had a PCN reaction causing severe rash involving mucus membranes or skin necrosis: No Has patient had a PCN reaction that required hospitalization: No Has patient had a PCN reaction occurring within the last 10 years: No If all of the above answers are "NO", then may proceed with Cephalosporin use.   [name]  Shellfish Allergy Hives    MEDICATIONS: Current Outpatient Medications on File Prior to Visit  Medication Sig Dispense Refill  . acetaminophen (TYLENOL) 500 MG tablet Take 1,000 mg by mouth every 6 (six) hours as needed (Pain).     [name] aspirin 81 MG tablet Take 81 mg by mouth daily.    .  diclofenac sodium (VOLTAREN) 1 % GEL Apply 2 g topically 4 (four) times daily. Rub into affected area of foot 2 to 4 times daily 100 g 2  . ezetimibe (ZETIA) 10 MG tablet Take 1 tablet (10 mg total) by mouth daily. 90 tablet 3  . metoprolol tartrate (LOPRESSOR) 25 MG tablet Take 0.5 tablets (12.5 mg total) by mouth 2 (two) times daily. 90 tablet 3  . nitroGLYCERIN (NITROSTAT) 0.4 MG SL tablet Place 1 tablet (0.4 mg total) under the tongue every 5 (five) minutes as needed for chest pain. 25 tablet 3  . omeprazole (PRILOSEC) 40 MG capsule Take 1 capsule (40 mg total) by mouth daily. 90 capsule 3  . ramipril (ALTACE) 5 MG capsule Take 1 capsule (5 mg total) by mouth daily. 90 capsule 3  . rosuvastatin (CRESTOR) 40 MG tablet Take 1 tablet (40 mg total) by mouth daily. 90 tablet 3  . Vitamin D, Ergocalciferol, (DRISDOL) 1.25 MG (50000 UT) CAPS capsule Take 1 capsule (50,000 Units total) by mouth every 7 (seven) days. 4 capsule 0   No current facility-administered medications on file prior to visit.     PAST MEDICAL HISTORY: Past Medical History:  Diagnosis Date  . Aortic valve sclerosis    echo 56/3893, soft systolic murmur  . 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 Back pain   . Carotid artery disease ([place])    40-59% bilateral, doppler December 2010  . Constipation   . Coronary artery disease    a. BMS-mid RCA 11/2007 b. stress echo 02/2009 subtle inferior HK, lateral ST depressions with stress c. follow-up cath 02/2009: RCA stent patent, severe but stable stenosis of distal posterior lateral branch RCA. LV normal, med tx unless more sx c. ETT Myoview 08/27/12: negative for ischemia; EF 64%  . Decreased hearing   . Depression    Mild, August, 2012  . Dyslipidemia   . Easy bruising   . Ejection fraction    EF normal, catheterization, 2011 /  EF 55%, echo, 2010  . Fluid overload    Mild, stable since hospitalization in the past  . GERD (gastroesophageal reflux disease)    occarionally, takes Pepcid  over the counter  . Heart murmur    has, occasional PVC's  . History of right hip replacement   . Hyperlipidemia   . Hypertension   . Joint pain   . Myocardial infarction ([place])    2009  . Palpitations   . PVC (premature ventricular contraction)    per prior Holter monitor  . Right hip pain 12/2009  . Shellfish allergy   . Trouble in sleeping   . Vitamin B12 deficiency   . Vitamin D deficiency     PAST SURGICAL HISTORY: Past Surgical History:  Procedure Laterality Date  . 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/01/2005  . carcinoid tumor removal    . CARDIAC CATHETERIZATION  1/211   Patent RCA stent, stenotic PLB supplying small distribution; medically managed  . CORONARY ANGIOPLASTY WITH STENT PLACEMENT  11/2007   BMS-mid RCA  . DIAGNOSTIC LAPAROSCOPY     1982 for endometriosis  . DILATION AND CURETTAGE OF UTERUS  2005  . hx of wisdom teeth surgery    . SKIN CANCER EXCISION    . TOTAL HIP ARTHROPLASTY Right 07/08/2014   Procedure: RIGHT TOTAL HIP ARTHROPLASTY ANTERIOR APPROACH;  Surgeon: Mcarthur Rossetti, MD;  Location: [facility];  Service: Orthopedics;  Laterality: Right;    SOCIAL HISTORY: Social History   Tobacco Use  . Smoking status: Never Smoker  . Smokeless tobacco: Never Used  Substance Use Topics  . Alcohol use: Yes    Alcohol/week: 7.0 standard drinks    Types: 7 Glasses of wine per week    Comment: 1 glass of wine/night  . Drug use: No    FAMILY HISTORY: Family History  Problem Relation Age of Onset  . Heart attack Father 41  . Hyperlipidemia Father   . Hypertension Father   . Sudden death Father   . Stroke Mother   . Hyperlipidemia Mother   . Hypertension Mother   . Heart disease Mother   . Obesity Mother   . Heart attack Paternal Grandfather   . Heart disease Sister   . BRCA 1/2 Neg Hx   . Breast cancer Neg Hx     ROS: Review of Systems  Constitutional: Negative for weight loss.  Cardiovascular: Negative for chest pain and claudication.   Musculoskeletal: Negative for myalgias.    PHYSICAL EXAM: Pt in no acute distress  RECENT LABS AND TESTS: BMET    Component Value Date/Time   NA 143 03/03/2018 1116   K 4.4 03/03/2018 1116   CL 103 03/03/2018 1116   CO2 23 03/03/2018 1116   GLUCOSE 89 03/03/2018 1116   GLUCOSE 92 10/14/2017 1923   BUN 22 03/03/2018 1116   CREATININE 0.85 03/03/2018 1116   CALCIUM 9.7 03/03/2018 1116   GFRNONAA 70 03/03/2018 1116   GFRAA 81 03/03/2018 1116   Lab Results  Component Value Date   HGBA1C 5.6 03/03/2018   HGBA1C 5.6 10/15/2017   HGBA1C 5.6 06/16/2017   Lab Results  Component Value Date   INSULIN 5.0 03/03/2018   INSULIN 5.9 10/15/2017   INSULIN 6.4 06/16/2017   CBC    Component Value Date/Time   WBC 7.4 03/03/2018 1116   WBC 12.2 (H) 10/14/2017 1923   RBC 4.20 03/03/2018 1116   RBC 4.21 10/14/2017 1923   HGB 13.2 03/03/2018 1116   HCT 39.3 03/03/2018 1116   PLT 235 10/14/2017 1923   MCV 94 03/03/2018 1116   MCH 31.4 03/03/2018 1116   MCH 31.8 10/14/2017 1923   MCHC 33.6 03/03/2018 1116   MCHC 33.8 10/14/2017 1923   RDW 11.9 03/03/2018 1116   LYMPHSABS 3.0 03/03/2018 1116   MONOABS 0.8 10/14/2017 1923   EOSABS 0.4 03/03/2018 1116   BASOSABS 0.1 03/03/2018 1116   Iron/TIBC/Ferritin/ %Sat    Component Value Date/Time   IRON 62 01/09/2015 0806   FERRITIN 26.6 01/09/2015 0806   IRONPCTSAT 15.2 (L) 01/09/2015 0806   Lipid Panel     Component Value Date/Time   CHOL 159 03/03/2018 1116   TRIG 105 03/03/2018 1116   HDL 69 03/03/2018 1116   CHOLHDL 2.5 10/29/2016 1030   CHOLHDL 3 11/30/2015 1028   VLDL 25.4 11/30/2015 1028   LDLCALC 69 03/03/2018 1116   Hepatic Function Panel     Component Value Date/Time   PROT 6.7 03/03/2018 1116   ALBUMIN 4.5 03/03/2018 1116   AST 22 03/03/2018 1116   ALT 18 03/03/2018 1116   ALKPHOS 75 03/03/2018 1116   BILITOT 0.3  03/03/2018 1116   BILIDIR 0.07 10/29/2016 1030      Component Value Date/Time   TSH 0.931  06/16/2017 0000   TSH 1.43 01/09/2015 0806   TSH 0.876 08/26/2012 1722      I, Trixie Dredge, am acting as transcriptionist for Dennard Nip, MD I have reviewed the above documentation for accuracy and completeness, and I agree with the above. -Dennard Nip, MD

## 2018-09-02 ENCOUNTER — Ambulatory Visit (INDEPENDENT_AMBULATORY_CARE_PROVIDER_SITE_OTHER): Payer: Medicare HMO | Admitting: Family Medicine

## 2018-09-02 ENCOUNTER — Ambulatory Visit: Payer: Self-pay

## 2018-09-02 ENCOUNTER — Other Ambulatory Visit: Payer: Self-pay

## 2018-09-02 ENCOUNTER — Encounter: Payer: Self-pay | Admitting: Family Medicine

## 2018-09-02 VITALS — BP 102/70 | HR 60 | Ht 60.0 in | Wt 144.0 lb

## 2018-09-02 DIAGNOSIS — G8929 Other chronic pain: Secondary | ICD-10-CM | POA: Diagnosis not present

## 2018-09-02 DIAGNOSIS — S62501S Fracture of unspecified phalanx of right thumb, sequela: Secondary | ICD-10-CM | POA: Diagnosis not present

## 2018-09-02 DIAGNOSIS — M79644 Pain in right finger(s): Secondary | ICD-10-CM | POA: Diagnosis not present

## 2018-09-02 NOTE — Patient Instructions (Signed)
See me again in 6 weeks OT will be calling you

## 2018-09-02 NOTE — Progress Notes (Signed)
Courtney [name] D.O. [facility] [address] [place], [place] [address] Phone: [phone] Subjective:   I, Courtney [name], am serving as a scribe for Dr. Zachary [name].   CC: Right thumb follow-up  HPI:Subjective   08/05/2018: Patient doing significantly better at this time.  Increase her activity as tolerated.  Can wear the brace still at night if needed.  Follow-up with me again 4 weeks if not completely resolved.  Update 09/02/2018: Courtney [name] is a 67 y.o. female coming in with complaint of right thumb pain. Patient states that she is doing better but states that she does have a sharp pain that occurs with gripping. Notes using a stapler the other day and she was in a lot of pain. Has discontinued use of the braces. Patient also notes a decrease in ROM of the IP joint.        Past Medical History:  Diagnosis Date  . Aortic valve sclerosis    echo 12/2008, soft systolic murmur  . 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 Back pain   . Carotid artery disease (HCC)    40-59% bilateral, doppler December 2010  . Constipation   . Coronary artery disease    a. BMS-mid RCA 11/2007 b. stress echo 02/2009 subtle inferior HK, lateral ST depressions with stress c. follow-up cath 02/2009: RCA stent patent, severe but stable stenosis of distal posterior lateral branch RCA. LV normal, med tx unless more sx c. ETT Myoview 08/27/12: negative for ischemia; EF 64%  . Decreased hearing   . Depression    Mild, August, 2012  . Dyslipidemia   . Easy bruising   . Ejection fraction    EF normal, catheterization, 2011 /  EF 55%, echo, 2010  . Fluid overload    Mild, stable since hospitalization in the past  . GERD (gastroesophageal reflux disease)    occarionally, takes Pepcid over the counter  . Heart murmur    has, occasional PVC's  . History of right hip replacement   . Hyperlipidemia   . Hypertension   . Joint pain   . Myocardial infarction (HCC)    2009  . Palpitations   . PVC  (premature ventricular contraction)    per prior Holter monitor  . Right hip pain 12/2009  . Shellfish allergy   . Trouble in sleeping   . Vitamin B12 deficiency   . Vitamin D deficiency    Past Surgical History:  Procedure Laterality Date  . 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/01/2005  . carcinoid tumor removal    . CARDIAC CATHETERIZATION  1/211   Patent RCA stent, stenotic PLB supplying small distribution; medically managed  . CORONARY ANGIOPLASTY WITH STENT PLACEMENT  11/2007   BMS-mid RCA  . DIAGNOSTIC LAPAROSCOPY     1982 for endometriosis  . DILATION AND CURETTAGE OF UTERUS  2005  . hx of wisdom teeth surgery    . SKIN CANCER EXCISION    . TOTAL HIP ARTHROPLASTY Right 07/08/2014   Procedure: RIGHT TOTAL HIP ARTHROPLASTY ANTERIOR APPROACH;  Surgeon: Christopher Y Blackman, MD;  Location: [facility];  Service: Orthopedics;  Laterality: Right;   Social History   Socioeconomic History  . Marital status: Divorced    Spouse name: Not on file  . Number of children: 1  . Years of education: 18  . Highest education level: Not on file  Occupational History  . Occupation: Retired - Nursing Director  Social Needs  . Financial resource strain: Not hard   at all  . Food insecurity    Worry: Never true    Inability: Never true  . Transportation needs    Medical: No    Non-medical: No  Tobacco Use  . Smoking status: Never Smoker  . Smokeless tobacco: Never Used  Substance and Sexual Activity  . Alcohol use: Yes    Alcohol/week: 7.0 standard drinks    Types: 7 Glasses of wine per week    Comment: 1 glass of wine/night  . Drug use: No  . Sexual activity: Yes  Lifestyle  . Physical activity    Days per week: 3 days    Minutes per session: 30 min  . Stress: Not at all  Relationships  . Social connections    Talks on phone: More than three times a week    Gets together: More than three times a week    Attends religious service: Not on file    Active member of club or organization: Not on  file    Attends meetings of clubs or organizations: Not on file    Relationship status: Living with partner  Other Topics Concern  . Not on file  Social History Narrative   Fun: Dance, garden,    Denies religious beliefs effecting health care.   Denies abuse and feels safe at home   Allergies  Allergen Reactions  . Erythromycin Hives  . Penicillins Hives    Has patient had a PCN reaction causing immediate rash, facial/tongue/throat swelling, SOB or lightheadedness with hypotension: No Has patient had a PCN reaction causing severe rash involving mucus membranes or skin necrosis: No Has patient had a PCN reaction that required hospitalization: No Has patient had a PCN reaction occurring within the last 10 years: No If all of the above answers are "NO", then may proceed with Cephalosporin use.   . Shellfish Allergy Hives   Family History  Problem Relation Age of Onset  . Heart attack Father 70  . Hyperlipidemia Father   . Hypertension Father   . Sudden death Father   . Stroke Mother   . Hyperlipidemia Mother   . Hypertension Mother   . Heart disease Mother   . Obesity Mother   . Heart attack Paternal Grandfather   . Heart disease Sister   . BRCA 1/2 Neg Hx   . Breast cancer Neg Hx      Current Outpatient Medications (Cardiovascular):  .  ezetimibe (ZETIA) 10 MG tablet, Take 1 tablet (10 mg total) by mouth daily. .  metoprolol tartrate (LOPRESSOR) 25 MG tablet, Take 0.5 tablets (12.5 mg total) by mouth 2 (two) times daily. .  nitroGLYCERIN (NITROSTAT) 0.4 MG SL tablet, Place 1 tablet (0.4 mg total) under the tongue every 5 (five) minutes as needed for chest pain. .  ramipril (ALTACE) 5 MG capsule, Take 1 capsule (5 mg total) by mouth daily. .  rosuvastatin (CRESTOR) 40 MG tablet, Take 1 tablet (40 mg total) by mouth daily.   Current Outpatient Medications (Analgesics):  .  acetaminophen (TYLENOL) 500 MG tablet, Take 1,000 mg by mouth every 6 (six) hours as needed (Pain).   .  aspirin 81 MG tablet, Take 81 mg by mouth daily.   Current Outpatient Medications (Other):  .  diclofenac sodium (VOLTAREN) 1 % GEL, Apply 2 g topically 4 (four) times daily. Rub into affected area of foot 2 to 4 times daily .  omeprazole (PRILOSEC) 40 MG capsule, Take 1 capsule (40 mg total) by mouth daily. .    Vitamin D, Ergocalciferol, (DRISDOL) 1.25 MG (50000 UT) CAPS capsule, Take 1 capsule (50,000 Units total) by mouth every 7 (seven) days.    Past medical history, social, surgical and family history all reviewed in electronic medical record.  No pertanent information unless stated regarding to the chief complaint.   Review of Systems:  No headache, visual changes, nausea, vomiting, diarrhea, constipation, dizziness, abdominal pain, skin rash, fevers, chills, night sweats, weight loss, swollen lymph nodes, body aches, joint swelling,  chest pain, shortness of breath, mood changes.  Positive muscle aches  Objective  Blood pressure 102/70, pulse 60, height 5' (1.524 m), weight 144 lb (65.3 kg), SpO2 99 %.    General: No apparent distress alert and oriented x3 mood and affect normal, dressed appropriately.  HEENT: Pupils equal, extraocular movements intact  Respiratory: Patient's speak in full sentences and does not appear short of breath  Cardiovascular: No lower extremity edema, non tender, no erythema  Skin: Warm dry intact with no signs of infection or rash on extremities or on axial skeleton.  Abdomen: Soft nontender  Neuro: Cranial nerves II through XII are intact, neurovascularly intact in all extremities with 2+ DTRs and 2+ pulses.  Lymph: No lymphadenopathy of posterior or anterior cervical chain or axillae bilaterally.  Gait normal with good balance and coordination.  MSK:  tender with limited range of motion and stability and symmetric strength and tone of shoulders, elbows, Kionex hip, knee and ankles bilaterally.  Patient's right hand and wrist exam show patient is  thumb actively does not have any flexion at PIP.  Passively does have flexion.  Does have some tenderness.  Does seem to be more stiffness.  Neurovascular intact distally.  No pain over the [facility] joint.  Limited musculoskeletal ultrasound was performed and interpreted by Lyndal Pulley  Limited ultrasound shows the patient does have what appears to be an avulsion of the IP aspect.  Patient though with range of motion testing does show that the flexor tendon is intact distally.  No significant breakdown of the joint noted.   Impression and Recommendations:     This case required medical decision making of moderate complexity. The above documentation has been reviewed and is accurate and complete Lyndal Pulley, DO       Note: This dictation was prepared with Dragon dictation along with smaller phrase technology. Any transcriptional errors that result from this process are unintentional.

## 2018-09-02 NOTE — Assessment & Plan Note (Signed)
.    Flexor tendon is intact.  When isolating against active range of motion also gave feel the flexion occurring.  Ultrasound does show the avulsion still noted though.  I encouraged her to continue the vitamin D supplementation.  Start occupational therapy to see how patient responds.  Follow-up again in 6 weeks.

## 2018-09-14 ENCOUNTER — Ambulatory Visit (INDEPENDENT_AMBULATORY_CARE_PROVIDER_SITE_OTHER): Payer: Medicare HMO | Admitting: Physician Assistant

## 2018-09-14 ENCOUNTER — Encounter (INDEPENDENT_AMBULATORY_CARE_PROVIDER_SITE_OTHER): Payer: Self-pay | Admitting: Physician Assistant

## 2018-09-14 ENCOUNTER — Other Ambulatory Visit: Payer: Self-pay

## 2018-09-14 VITALS — BP 116/53 | HR 51 | Temp 97.8°F | Ht 60.0 in | Wt 139.0 lb

## 2018-09-14 DIAGNOSIS — E8881 Metabolic syndrome: Secondary | ICD-10-CM

## 2018-09-14 DIAGNOSIS — Z6830 Body mass index (BMI) 30.0-30.9, adult: Secondary | ICD-10-CM | POA: Diagnosis not present

## 2018-09-14 DIAGNOSIS — E669 Obesity, unspecified: Secondary | ICD-10-CM | POA: Diagnosis not present

## 2018-09-14 DIAGNOSIS — E559 Vitamin D deficiency, unspecified: Secondary | ICD-10-CM

## 2018-09-14 DIAGNOSIS — E782 Mixed hyperlipidemia: Secondary | ICD-10-CM

## 2018-09-14 DIAGNOSIS — I10 Essential (primary) hypertension: Secondary | ICD-10-CM | POA: Diagnosis not present

## 2018-09-15 LAB — HEMOGLOBIN A1C
Est. average glucose Bld gHb Est-mCnc: 108 mg/dL
Hgb A1c MFr Bld: 5.4 % (ref 4.8–5.6)

## 2018-09-15 LAB — COMPREHENSIVE METABOLIC PANEL
ALT: 20 IU/L (ref 0–32)
AST: 23 IU/L (ref 0–40)
Albumin/Globulin Ratio: 2.6 — ABNORMAL HIGH (ref 1.2–2.2)
Albumin: 4.6 g/dL (ref 3.8–4.8)
Alkaline Phosphatase: 66 IU/L (ref 39–117)
BUN/Creatinine Ratio: 27 (ref 12–28)
BUN: 20 mg/dL (ref 8–27)
Bilirubin Total: 0.3 mg/dL (ref 0.0–1.2)
CO2: 24 mmol/L (ref 20–29)
Calcium: 9.6 mg/dL (ref 8.7–10.3)
Chloride: 103 mmol/L (ref 96–106)
Creatinine, Ser: 0.74 mg/dL (ref 0.57–1.00)
GFR calc Af Amer: 96 mL/min/{1.73_m2} (ref >59)
GFR calc non Af Amer: 83 mL/min/{1.73_m2} (ref >59)
Globulin, Total: 1.8 g/dL (ref 1.5–4.5)
Glucose: 99 mg/dL (ref 65–99)
Potassium: 4.3 mmol/L (ref 3.5–5.2)
Sodium: 141 mmol/L (ref 134–144)
Total Protein: 6.4 g/dL (ref 6.0–8.5)

## 2018-09-15 LAB — INSULIN, RANDOM: INSULIN: 4.2 u[IU]/mL (ref 2.6–24.9)

## 2018-09-15 LAB — LIPID PANEL WITH LDL/HDL RATIO
Cholesterol, Total: 156 mg/dL (ref 100–199)
HDL: 74 mg/dL (ref >39)
LDL Calculated: 65 mg/dL (ref 0–99)
LDl/HDL Ratio: 0.9 ratio (ref 0.0–3.2)
Triglycerides: 85 mg/dL (ref 0–149)
VLDL Cholesterol Cal: 17 mg/dL (ref 5–40)

## 2018-09-15 LAB — VITAMIN D 25 HYDROXY (VIT D DEFICIENCY, FRACTURES): Vit D, 25-Hydroxy: 64.6 ng/mL (ref 30.0–100.0)

## 2018-09-15 MED ORDER — VITAMIN D (ERGOCALCIFEROL) 1.25 MG (50000 UNIT) PO CAPS: 50000.0000 [IU] | ORAL_CAPSULE | ORAL | 0 refills | Status: DC

## 2018-09-15 NOTE — Progress Notes (Signed)
Office: [phone]  /  Fax: [phone]   HPI:   Chief Complaint: OBESITY Courtney [name] is here to discuss her progress with her obesity treatment plan. She is on the Category 1 plan and is following her eating plan approximately 90% of the time. 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 [name] reports that she is doing well following the plan and maintaining her weight. She is getting hungry at times between lunch and dinner. She starts a new part-time job in 2 weeks and is asking about meal prep and planning while working 1-9 p.m. two days a week.  Her weight is 139 lb (63 kg) today and has had a weight loss of 1 pound over a period of 4 months since her last visit. She has lost 18 lbs since starting treatment with Courtney [name].  Hyperlipidemia [name] has hyperlipidemia and has been trying to improve her cholesterol levels with intensive lifestyle modification including a low saturated fat diet, exercise and weight loss. She is on Zetia and Crestor and denies any chest pain.   Vitamin D deficiency [name] has a diagnosis of Vitamin D deficiency. She is currently taking prescription Vit D every other week and denies nausea, vomiting or muscle weakness.  Insulin Resistance [name] has a diagnosis of insulin resistance based on her elevated fasting insulin level >5. Although [name]'s blood glucose readings are still under good control, insulin resistance puts her at greater risk of metabolic syndrome and diabetes. She is on no medication currently and continues to work on diet and exercise to decrease risk of diabetes. No polyphagia.  ASSESSMENT AND PLAN:  Mixed hyperlipidemia - Plan: Lipid Panel With LDL/HDL Ratio  Vitamin D deficiency - Plan: VITAMIN D 25 Hydroxy (Vit-D Deficiency, Fractures), Vitamin D, Ergocalciferol, (DRISDOL) 1.25 MG (50000 UT) CAPS capsule  Insulin resistance - Plan: Comprehensive metabolic panel, Hemoglobin A1c, Insulin, random  Class 1 obesity with serious comorbidity and body  mass index (BMI) of 30.0 to 30.9 in adult, unspecified obesity type  PLAN:  Hyperlipidemia [name] was informed of the American Heart Association Guidelines emphasizing intensive lifestyle modifications as the first line treatment for hyperlipidemia. We discussed many lifestyle modifications today in depth, and [name] will continue to work on decreasing saturated fats such as fatty red meat, butter and many fried foods. She will continue her medications, get her labs checked, increase vegetables and lean protein in her diet, and continue to work on exercise and weight loss efforts.  Vitamin D Deficiency [name] was informed that low Vitamin D levels contributes to fatigue and are associated with obesity, breast, and colon cancer. She agrees to continue to take prescription Vit D @ 50,000 IU every 14 days #2 with 0 refills and will follow-up for routine testing of Vitamin D, at least 2-3 times per year. She was informed of the risk of over-replacement of Vitamin D and agrees to not increase her dose unless she discusses this with Courtney [name] first. [name] agrees to follow-up with our clinic in 3-4 weeks.  Insulin Resistance [name] will continue to work on weight loss, exercise, and decreasing simple carbohydrates in her diet to help decrease the risk of diabetes. We dicussed metformin including benefits and risks. She was informed that eating too many simple carbohydrates or too many calories at one sitting increases the likelihood of GI side effects. [name] will continue with her meal plan, get her labs checked, and follow-up with Courtney [name] as directed to monitor her progress.  Obesity [name] is currently in the action  stage of change. As such, her goal is to continue with weight loss efforts. She has agreed to follow the Category 1 plan + 100 calories. [name] has been instructed to work up to a goal of 150 minutes of combined cardio and strengthening exercise per week for weight loss and overall health benefits. We  discussed the following Behavioral Modification Strategies today: work on meal planning and easy cooking plans, and keeping healthy foods in the home  [name] has agreed to follow-up with our clinic in 3-4 weeks. She was informed of the importance of frequent follow-up visits to maximize her success with intensive lifestyle modifications for her multiple health conditions.  ALLERGIES: Allergies  Allergen Reactions   Erythromycin Hives   Penicillins Hives    Has patient had a PCN reaction causing immediate rash, facial/tongue/throat swelling, SOB or lightheadedness with hypotension: No Has patient had a PCN reaction causing severe rash involving mucus membranes or skin necrosis: No Has patient had a PCN reaction that required hospitalization: No Has patient had a PCN reaction occurring within the last 10 years: No If all of the above answers are "NO", then may proceed with Cephalosporin use.    Shellfish Allergy Hives    MEDICATIONS: Current Outpatient Medications on File Prior to Visit  Medication Sig Dispense Refill   acetaminophen (TYLENOL) 500 MG tablet Take 1,000 mg by mouth every 6 (six) hours as needed (Pain).      aspirin 81 MG tablet Take 81 mg by mouth daily.     diclofenac sodium (VOLTAREN) 1 % GEL Apply 2 g topically 4 (four) times daily. Rub into affected area of foot 2 to 4 times daily 100 g 2   ezetimibe (ZETIA) 10 MG tablet Take 1 tablet (10 mg total) by mouth daily. 90 tablet 3   metoprolol tartrate (LOPRESSOR) 25 MG tablet Take 0.5 tablets (12.5 mg total) by mouth 2 (two) times daily. 90 tablet 3   nitroGLYCERIN (NITROSTAT) 0.4 MG SL tablet Place 1 tablet (0.4 mg total) under the tongue every 5 (five) minutes as needed for chest pain. 25 tablet 3   omeprazole (PRILOSEC) 40 MG capsule Take 1 capsule (40 mg total) by mouth daily. 90 capsule 3   ramipril (ALTACE) 5 MG capsule Take 1 capsule (5 mg total) by mouth daily. 90 capsule 3   rosuvastatin (CRESTOR) 40 MG  tablet Take 1 tablet (40 mg total) by mouth daily. 90 tablet 3   No current facility-administered medications on file prior to visit.     PAST MEDICAL HISTORY: Past Medical History:  Diagnosis Date   Aortic valve sclerosis    echo 63/8466, soft systolic murmur   Arthritis    in right hip   Back pain    Carotid artery disease (HCC)    40-59% bilateral, doppler December 2010   Constipation    Coronary artery disease    a. BMS-mid RCA 11/2007 b. stress echo 02/2009 subtle inferior HK, lateral ST depressions with stress c. follow-up cath 02/2009: RCA stent patent, severe but stable stenosis of distal posterior lateral branch RCA. LV normal, med tx unless more sx c. ETT Myoview 08/27/12: negative for ischemia; EF 64%   Decreased hearing    Depression    Mild, August, 2012   Dyslipidemia    Easy bruising    Ejection fraction    EF normal, catheterization, 2011 /  EF 55%, echo, 2010   Fluid overload    Mild, stable since hospitalization in the past  GERD (gastroesophageal reflux disease)    occarionally, takes Pepcid over the counter   Heart murmur    has, occasional PVC's   History of right hip replacement    Hyperlipidemia    Hypertension    Joint pain    Myocardial infarction ([place])    2009   Palpitations    PVC (premature ventricular contraction)    per prior Holter monitor   Right hip pain 12/2009   Shellfish allergy    Trouble in sleeping    Vitamin B12 deficiency    Vitamin D deficiency     PAST SURGICAL HISTORY: Past Surgical History:  Procedure Laterality Date   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/01/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     CARDIAC CATHETERIZATION  1/211   Patent RCA stent, stenotic PLB supplying small distribution; medically managed   CORONARY ANGIOPLASTY WITH STENT PLACEMENT  11/2007   BMS-mid RCA   DIAGNOSTIC LAPAROSCOPY     1982 for endometriosis   DILATION AND CURETTAGE OF UTERUS  2005   hx of wisdom teeth surgery      SKIN CANCER EXCISION     TOTAL HIP ARTHROPLASTY Right 07/08/2014   Procedure: RIGHT TOTAL HIP ARTHROPLASTY ANTERIOR APPROACH;  Surgeon: Mcarthur Rossetti, MD;  Location: [facility];  Service: Orthopedics;  Laterality: Right;    SOCIAL HISTORY: Social History   Tobacco Use   Smoking status: Never Smoker   Smokeless tobacco: Never Used  Substance Use Topics   Alcohol use: Yes    Alcohol/week: 7.0 standard drinks    Types: 7 Glasses of wine per week    Comment: 1 glass of wine/night   Drug use: No    FAMILY HISTORY: Family History  Problem Relation Age of Onset   Heart attack Father 93   Hyperlipidemia Father    Hypertension Father    Sudden death Father    Stroke Mother    Hyperlipidemia Mother    Hypertension Mother    Heart disease Mother    Obesity Mother    Heart attack Paternal Grandfather    Heart disease Sister    BRCA 1/2 Neg Hx    Breast cancer Neg Hx    ROS: Review of Systems  Cardiovascular: Negative for chest pain.  Gastrointestinal: Negative for nausea and vomiting.  Musculoskeletal:       Negative for muscle weakness.  Endo/Heme/Allergies:       Negative for polyphagia.   PHYSICAL EXAM: Blood pressure (!) 116/53, pulse (!) 51, temperature 97.8 F (36.6 C), height 5' (1.524 m), weight 139 lb (63 kg), SpO2 99 %. Body mass index is 27.15 kg/m. Physical Exam Vitals signs reviewed.  Constitutional:      Appearance: Normal appearance. She is obese.  Cardiovascular:     Rate and Rhythm: Normal rate.     Pulses: Normal pulses.  Pulmonary:     Effort: Pulmonary effort is normal.     Breath sounds: Normal breath sounds.  Musculoskeletal: Normal range of motion.  Skin:    General: Skin is warm and dry.  Neurological:     Mental Status: She is alert and oriented to person, place, and time.  Psychiatric:        Behavior: Behavior normal.   RECENT LABS AND TESTS: BMET    Component Value Date/Time   NA 143 03/03/2018 1116   K 4.4  03/03/2018 1116   CL 103 03/03/2018 1116   CO2 23 03/03/2018 1116   GLUCOSE 89 03/03/2018 1116  GLUCOSE 92 10/14/2017 1923   BUN 22 03/03/2018 1116   CREATININE 0.85 03/03/2018 1116   CALCIUM 9.7 03/03/2018 1116   GFRNONAA 70 03/03/2018 1116   GFRAA 81 03/03/2018 1116   Lab Results  Component Value Date   HGBA1C 5.6 03/03/2018   HGBA1C 5.6 10/15/2017   HGBA1C 5.6 06/16/2017   Lab Results  Component Value Date   INSULIN 5.0 03/03/2018   INSULIN 5.9 10/15/2017   INSULIN 6.4 06/16/2017   CBC    Component Value Date/Time   WBC 7.4 03/03/2018 1116   WBC 12.2 (H) 10/14/2017 1923   RBC 4.20 03/03/2018 1116   RBC 4.21 10/14/2017 1923   HGB 13.2 03/03/2018 1116   HCT 39.3 03/03/2018 1116   PLT 235 10/14/2017 1923   MCV 94 03/03/2018 1116   MCH 31.4 03/03/2018 1116   MCH 31.8 10/14/2017 1923   MCHC 33.6 03/03/2018 1116   MCHC 33.8 10/14/2017 1923   RDW 11.9 03/03/2018 1116   LYMPHSABS 3.0 03/03/2018 1116   MONOABS 0.8 10/14/2017 1923   EOSABS 0.4 03/03/2018 1116   BASOSABS 0.1 03/03/2018 1116   Iron/TIBC/Ferritin/ %Sat    Component Value Date/Time   IRON 62 01/09/2015 0806   FERRITIN 26.6 01/09/2015 0806   IRONPCTSAT 15.2 (L) 01/09/2015 0806   Lipid Panel     Component Value Date/Time   CHOL 159 03/03/2018 1116   TRIG 105 03/03/2018 1116   HDL 69 03/03/2018 1116   CHOLHDL 2.5 10/29/2016 1030   CHOLHDL 3 11/30/2015 1028   VLDL 25.4 11/30/2015 1028   LDLCALC 69 03/03/2018 1116   Hepatic Function Panel     Component Value Date/Time   PROT 6.7 03/03/2018 1116   ALBUMIN 4.5 03/03/2018 1116   AST 22 03/03/2018 1116   ALT 18 03/03/2018 1116   ALKPHOS 75 03/03/2018 1116   BILITOT 0.3 03/03/2018 1116   BILIDIR 0.07 10/29/2016 1030      Component Value Date/Time   TSH 0.931 06/16/2017 0000   TSH 1.43 01/09/2015 0806   TSH 0.876 08/26/2012 1722   Results for KAYELYNN, ABDOU (MRN [id number]) as of 09/15/2018 07:36  Ref. Range 03/03/2018 11:16  Vitamin D,  25-Hydroxy Latest Ref Range: 30.0 - 100.0 ng/mL 54.6   OBESITY BEHAVIORAL INTERVENTION VISIT  Today's visit was #20  Starting weight: 157 lbs Starting date: 06/16/2017 Today's weight: 139 lbs  Today's date: 09/14/2018 Total lbs lost to date: 18 At least 15 minutes were spent on discussing the following behavioral intervention visit.    09/14/2018  Height 5' (1.524 m)  Weight 139 lb (63 kg)  BMI (Calculated) 27.15  BLOOD PRESSURE - SYSTOLIC 841  BLOOD PRESSURE - DIASTOLIC 53   Body Fat % 32.4 %  Total Body Water (lbs) 55.81 lbs   ASK: We discussed the diagnosis of obesity with [name] today and Germani agreed to give Courtney [name] permission to discuss obesity behavioral modification therapy today.  ASSESS: Shaguana has the diagnosis of obesity and her BMI today is 27.3. Pansy is in the action stage of change.   ADVISE: Makita was educated on the multiple health risks of obesity as well as the benefit of weight loss to improve her health. She was advised of the need for long term treatment and the importance of lifestyle modifications to improve her current health and to decrease her risk of future health problems.  AGREE: Multiple dietary modification options and treatment options were discussed and  Tyliah agreed to follow the recommendations  documented in the above note.  ARRANGE: Averly was educated on the importance of frequent visits to treat obesity as outlined per CMS and USPSTF guidelines and agreed to schedule her next follow up appointment today.  Migdalia Dk, am acting as transcriptionist for Abby Potash, PA-C I, Abby Potash, PA-C have reviewed above note and agree with its content

## 2018-09-29 ENCOUNTER — Encounter (HOSPITAL_COMMUNITY): Payer: Self-pay | Admitting: Occupational Therapy

## 2018-09-29 ENCOUNTER — Other Ambulatory Visit: Payer: Self-pay

## 2018-09-29 ENCOUNTER — Ambulatory Visit (HOSPITAL_COMMUNITY): Payer: Medicare HMO | Attending: Family Medicine | Admitting: Occupational Therapy

## 2018-09-29 DIAGNOSIS — M25541 Pain in joints of right hand: Secondary | ICD-10-CM

## 2018-09-29 DIAGNOSIS — R29898 Other symptoms and signs involving the musculoskeletal system: Secondary | ICD-10-CM | POA: Insufficient documentation

## 2018-09-29 NOTE — Therapy (Signed)
[place], [place], [address] Phone: [phone]   Fax:  [phone]  Occupational Therapy Evaluation  Patient Details  Name: Courtney [name] MRN: [id number] Date of Birth: [birth year] Referring Provider (OT): Dr. Hulan Saas   Encounter Date: 09/29/2018  OT End of Session - 09/29/18 1150    Visit Number  1    Number of Visits  8    Date for OT Re-Evaluation  10/29/18    Authorization Type  [employer], $40 copay    Authorization Time Period  no visit limit    OT Start Time  1110    OT Stop Time  1142    OT Time Calculation (min)  32 min    Activity Tolerance  Patient tolerated treatment well    Behavior During Therapy  [facility] for tasks assessed/performed       Past Medical History:  Diagnosis Date  . Aortic valve sclerosis    echo 62/9476, soft systolic murmur  . 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 Back pain   . Carotid artery disease ([place])    40-59% bilateral, doppler December 2010  . Constipation   . Coronary artery disease    a. BMS-mid RCA 11/2007 b. stress echo 02/2009 subtle inferior HK, lateral ST depressions with stress c. follow-up cath 02/2009: RCA stent patent, severe but stable stenosis of distal posterior lateral branch RCA. LV normal, med tx unless more sx c. ETT Myoview 08/27/12: negative for ischemia; EF 64%  . Decreased hearing   . Depression    Mild, August, 2012  . Dyslipidemia   . Easy bruising   . Ejection fraction    EF normal, catheterization, 2011 /  EF 55%, echo, 2010  . Fluid overload    Mild, stable since hospitalization in the past  . GERD (gastroesophageal reflux disease)    occarionally, takes Pepcid over the counter  . Heart murmur    has, occasional PVC's  . History of right hip replacement   . Hyperlipidemia   . Hypertension   . Joint pain   . Myocardial infarction ([place])    2009  . Palpitations   . PVC (premature ventricular contraction)    per prior Holter monitor   . Right hip pain 12/2009  . Shellfish allergy   . Trouble in sleeping   . Vitamin B12 deficiency   . Vitamin D deficiency     Past Surgical History:  Procedure Laterality Date  . 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/01/2005  . carcinoid tumor removal    . CARDIAC CATHETERIZATION  1/211   Patent RCA stent, stenotic PLB supplying small distribution; medically managed  . CORONARY ANGIOPLASTY WITH STENT PLACEMENT  11/2007   BMS-mid RCA  . DIAGNOSTIC LAPAROSCOPY     1982 for endometriosis  . DILATION AND CURETTAGE OF UTERUS  2005  . hx of wisdom teeth surgery    . SKIN CANCER EXCISION    . TOTAL HIP ARTHROPLASTY Right 07/08/2014   Procedure: RIGHT TOTAL HIP ARTHROPLASTY ANTERIOR APPROACH;  Surgeon: Mcarthur Rossetti, MD;  Location: [facility];  Service: Orthopedics;  Laterality: Right;    There were no vitals filed for this visit.  Subjective Assessment - 09/29/18 1141    Subjective   S: I think I initially injured my thumb in April.    Pertinent History  Pt is a 68 y/o female s/p phalanx avulsion fx of right thumb. Pt believes injury occured mid-April, first saw  MD on 07/09/2018 who dx with avulsion fx and placed in a thumb spica splint for approximately 6 weeks. Pt reports improvements however is unable actively bend IP joint of right thumb. Pt was referred to occupational therapy by Dr. Hulan Saas.    Special Tests  DASH: 22.73    Patient Stated Goals  To be able to use my hand normally.    Currently in Pain?  No/denies        [facility] OT Assessment - 09/29/18 1106      Assessment   Medical Diagnosis  closed avulsion fx of right thumb    Referring Provider (OT)  Dr. Hulan Saas    Onset Date/Surgical Date  06/03/18   approximate onset   Hand Dominance  Right    Next MD Visit  10/20/2018    Prior Therapy  None      Precautions   Precautions  None      Restrictions   Weight Bearing Restrictions  No      Balance Screen   Has the patient fallen in the past 6 months  No    Has the  patient had a decrease in activity level because of a fear of falling?   No    Is the patient reluctant to leave their home because of a fear of falling?   No      Prior Function   Level of Independence  Independent    Vocation  Part time employment   temporary   Vocation Requirements  [employer]: phone calls, paperwork     Leisure  gardening, painting around the house      ADL   ADL comments  Pt is unable to use scissors, difficulty using curling iron, opening jars, gripping and holding objects      Written Expression   Dominant Hand  Right      Cognition   Overall Cognitive Status  Within Functional Limits for tasks assessed      Observation/Other Assessments   Quick DASH   22.73      Palpation   Palpation comment  No restrictions palpated      Right Hand AROM   R Thumb MCP 0-60  60 Degrees    R Thumb IP 0-80  15 Degrees      Right Hand PROM   R Thumb MCP 0-60  60 Degrees    R Thumb IP 0-80  30 Degrees      Hand Function   Right Hand Gross Grasp  Functional    Right Hand Grip (lbs)  30    Right Hand Lateral Pinch  8 lbs    Right Hand 3 Point Pinch  5 lbs    Left Hand Gross Grasp  Functional    Left Hand Grip (lbs)  38    Left Hand Lateral Pinch  13 lbs    Left 3 point pinch  7 lbs          Katina Dung - 09/29/18 1115    Open a tight or new jar  Unable    Do heavy household chores (wash walls, wash floors)  Mild difficulty    Carry a shopping bag or briefcase  No difficulty    Wash your back  No difficulty    Use a knife to cut food  No difficulty    Recreational activities in which you take some force or impact through your arm, shoulder, or hand (golf, hammering, tennis)  Severe difficulty  During the past week, to what extent has your arm, shoulder or hand problem interfered with your normal social activities with family, friends, neighbors, or groups?  Not at all    During the past week, to what extent has your arm, shoulder or hand problem limited  your work or other regular daily activities  Not at all    Arm, shoulder, or hand pain.  Moderate    Tingling (pins and needles) in your arm, shoulder, or hand  None    Difficulty Sleeping  No difficulty    DASH Score  22.73 %                 OT Education - 09/29/18 1150    Education Details  A/ROM-IP joint blocking, towel crunch with focus on thumb, red theraputty exercises    Person(s) Educated  Patient    Methods  Explanation;Demonstration;Handout    Comprehension  Verbalized understanding;Returned demonstration       OT Short Term Goals - 09/29/18 1156      OT SHORT TERM GOAL #1   Title  Pt will be provided with and educated on HEP for improved use of right hand as dominant during ADL tasks.    Time  4    Period  Weeks    Status  New    Target Date  10/29/18      OT SHORT TERM GOAL #2   Title  Pt will decrease pain in right hand to 3/10 or less to improve ability to perform gardening tasks.    Time  4    Period  Weeks    Status  New      OT SHORT TERM GOAL #3   Title  Pt will increase right thumb IP ROM to [facility] to improve ability to operate scissors during daily and leisure tasks.    Time  4    Period  Weeks    Status  New      OT SHORT TERM GOAL #4   Title  Pt will increase right grip strength by 10# and pinch strength by 3# to improve ability to open jars and lids.    Time  4    Period  Weeks    Status  New               Plan - 09/29/18 1151    Clinical Impression Statement  A: Pt is a 68 y/o female presenting with closed avulsion fx of proximal phalanx of right thumb, believed to be sustained after repetitive scissor use making face shields and then heavy gardening work. Pt is having difficulty using her right hand as her dominant due to inability to actively flex her IP joint.    OT Occupational Profile and History  Problem Focused Assessment - Including review of records relating to presenting problem    Occupational performance deficits  (Please refer to evaluation for details):  ADL's;IADL's;Work;Leisure    Body Structure / Function / Physical Skills  ADL;UE functional use;Pain;ROM;IADL;Strength    Rehab Potential  Good    Clinical Decision Making  Limited treatment options, no task modification necessary    Comorbidities Affecting Occupational Performance:  None    Modification or Assistance to Complete Evaluation   No modification of tasks or assist necessary to complete eval    OT Frequency  2x / week    OT Duration  4 weeks    OT Treatment/Interventions  Self-care/ADL training;Ultrasound;Patient/family education;Passive range of motion;Cryotherapy;Electrical Stimulation;Moist Heat;Therapeutic exercise;Manual  Therapy;Therapeutic activities    Plan  P: Pt will benefit from skilled OT services to decrease pain, increase ROM, strength, and functional use of right hand as dominant during functional tasks. Treatment plan: P/ROM, A/ROM, joint blocking, strengthening, grip and pinch strengthening, modalities prn    Consulted and Agree with Plan of Care  Patient       Patient will benefit from skilled therapeutic intervention in order to improve the following deficits and impairments:   Body Structure / Function / Physical Skills: ADL, UE functional use, Pain, ROM, IADL, Strength       Visit Diagnosis: 1. Other symptoms and signs involving the musculoskeletal system   2. Pain in joint of right hand       Problem List Patient Active Problem List   Diagnosis Date Noted  . Vitamin D deficiency 08/04/2018  . Avulsion fracture of phalanx of right thumb 07/09/2018  . Essential hypertension 11/17/2017  . Elevated sed rate 10/15/2017  . Sphenoid sinusitis 10/15/2017  . Obesity 07/15/2017  . Insomnia 07/15/2017  . Female stress incontinence 07/15/2017  . Anxiety 07/15/2017  . Anal fissure 02/25/2017  . Gastroesophageal reflux disease with esophagitis 02/25/2017  . Vertigo 01/05/2016  . Medicare annual wellness visit,  subsequent 11/29/2015  . Osteopenia 06/27/2015  . Routine general medical examination at a health care facility 01/06/2015  . Osteoarthritis of right hip 07/08/2014  . Sinus bradycardia 08/27/2012  . Depression   . Carotid artery disease ([place])   . PVC (premature ventricular contraction)   . Aortic valve sclerosis   . Mixed hyperlipidemia   . Coronary artery disease   . CAD (coronary artery disease)    Guadelupe Sabin, OTR/L  [phone] 09/29/2018, 11:59 AM  [place], [place], [address] Phone: [phone]   Fax:  [phone]  Name: Courtney [name] MRN: [id number] Date of Birth: Apr 09, 1950

## 2018-09-29 NOTE — Patient Instructions (Signed)
1) Towel crunch Place a small towel on a firm table top. Flatten out the towel and then place your hand on one end of it.  Next, flex your fingers 2-5 (index finger through pinky finger) as you pull the towel towards your hand.      2) IP Joint Blocking Grasp the right thumb, bracing below the last knuckle, and actively bend the finger at the last joint.     Home Exercises Program Theraputty Exercises  Do the following exercises 2 times a day using your affected hand.  1. Roll putty into a ball.  2. Make into a pancake.  3. Roll putty into a roll.  4. Pinch along log with first finger and thumb.   5. Make into a ball.  6. Roll it back into a log.   7. Pinch using thumb and side of first finger.  8. Roll into a ball, then flatten into a pancake.  9. Using your fingers, make putty into a mountain.

## 2018-10-05 ENCOUNTER — Ambulatory Visit (INDEPENDENT_AMBULATORY_CARE_PROVIDER_SITE_OTHER): Payer: Medicare HMO | Admitting: Family Medicine

## 2018-10-05 ENCOUNTER — Encounter: Payer: Self-pay | Admitting: Family

## 2018-10-05 ENCOUNTER — Other Ambulatory Visit: Payer: Self-pay | Admitting: Family

## 2018-10-05 ENCOUNTER — Encounter (HOSPITAL_COMMUNITY): Payer: Self-pay | Admitting: Specialist

## 2018-10-05 ENCOUNTER — Ambulatory Visit (HOSPITAL_COMMUNITY): Payer: Medicare HMO | Admitting: Specialist

## 2018-10-05 ENCOUNTER — Other Ambulatory Visit: Payer: Self-pay

## 2018-10-05 DIAGNOSIS — R29898 Other symptoms and signs involving the musculoskeletal system: Secondary | ICD-10-CM

## 2018-10-05 DIAGNOSIS — M25541 Pain in joints of right hand: Secondary | ICD-10-CM

## 2018-10-05 MED ORDER — PANTOPRAZOLE SODIUM 40 MG PO TBEC: 40.0000 mg | DELAYED_RELEASE_TABLET | Freq: Every day | ORAL | 3 refills | Status: DC

## 2018-10-05 NOTE — Patient Instructions (Signed)
Home Exercises Program Theraputty Exercises  Do the following exercises 1-3 times a day using your affected hand.  1. Roll putty into a ball.  2. Make into a pancake.  3. Roll putty into a roll.  4. Pinch along log with first finger and thumb.   5. Make into a ball.  6. Roll it back into a log.   7. Pinch using thumb and side of first finger.  8. Roll into a ball, then flatten into a pancake.  9. Using your fingers, make putty into a mountain.

## 2018-10-05 NOTE — Therapy (Signed)
[place], [place], [address] Phone: [phone]   Fax:  [phone]  Occupational Therapy Treatment  Patient Details  Name: Courtney [name] MRN: [id number] Date of Birth: [birth year] Referring Provider (OT): Dr. Hulan Saas   Encounter Date: 10/05/2018  OT End of Session - 10/05/18 2140    Visit Number  2    Number of Visits  8    Date for OT Re-Evaluation  10/29/18    Authorization Type  [employer], $40 copay    Authorization Time Period  no visit limit    OT Start Time  1615    OT Stop Time  1650    OT Time Calculation (min)  35 min    Activity Tolerance  Patient tolerated treatment well    Behavior During Therapy  [facility] for tasks assessed/performed       Past Medical History:  Diagnosis Date  . Aortic valve sclerosis    echo 13/2440, soft systolic murmur  . 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 Back pain   . Carotid artery disease ([place])    40-59% bilateral, doppler December 2010  . Constipation   . Coronary artery disease    a. BMS-mid RCA 11/2007 b. stress echo 02/2009 subtle inferior HK, lateral ST depressions with stress c. follow-up cath 02/2009: RCA stent patent, severe but stable stenosis of distal posterior lateral branch RCA. LV normal, med tx unless more sx c. ETT Myoview 08/27/12: negative for ischemia; EF 64%  . Decreased hearing   . Depression    Mild, August, 2012  . Dyslipidemia   . Easy bruising   . Ejection fraction    EF normal, catheterization, 2011 /  EF 55%, echo, 2010  . Fluid overload    Mild, stable since hospitalization in the past  . GERD (gastroesophageal reflux disease)    occarionally, takes Pepcid over the counter  . Heart murmur    has, occasional PVC's  . History of right hip replacement   . Hyperlipidemia   . Hypertension   . Joint pain   . Myocardial infarction ([place])    2009  . Palpitations   . PVC (premature ventricular contraction)    per prior Holter monitor   . Right hip pain 12/2009  . Shellfish allergy   . Trouble in sleeping   . Vitamin B12 deficiency   . Vitamin D deficiency     Past Surgical History:  Procedure Laterality Date  . 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/01/2005  . carcinoid tumor removal    . CARDIAC CATHETERIZATION  1/211   Patent RCA stent, stenotic PLB supplying small distribution; medically managed  . CORONARY ANGIOPLASTY WITH STENT PLACEMENT  11/2007   BMS-mid RCA  . DIAGNOSTIC LAPAROSCOPY     1982 for endometriosis  . DILATION AND CURETTAGE OF UTERUS  2005  . hx of wisdom teeth surgery    . SKIN CANCER EXCISION    . TOTAL HIP ARTHROPLASTY Right 07/08/2014   Procedure: RIGHT TOTAL HIP ARTHROPLASTY ANTERIOR APPROACH;  Surgeon: Mcarthur Rossetti, MD;  Location: [facility];  Service: Orthopedics;  Laterality: Right;    There were no vitals filed for this visit.  Subjective Assessment - 10/05/18 2139    Subjective   S: I have been doing the putty exercises.    Currently in Pain?  Yes    Pain Score  2     Pain Location  Hand   thumb  Pain Orientation  Right         [facility] OT Assessment - 10/05/18 0001      Assessment   Medical Diagnosis  closed avulsion fx of right thumb    Referring Provider (OT)  Dr. Hulan Saas      Precautions   Precautions  None               OT Treatments/Exercises (OP) - 10/05/18 0001      Exercises   Exercises  Hand;Theraputty      Hand Exercises   Thumb Opposition  to MCPJ of SF 5 times     Sponges  15, 21, 28 to promote grip strength, as well as improved functional IP flexion     Other Hand Exercises  joint blocking at MCPJ and IPJ five times each     Other Hand Exercises  IP joint place and hold 3 times for 3 seconds each       Theraputty   Theraputty - Flatten  pink in standing    Theraputty - Roll  pink    Theraputty - Grip  pink supinated and pronated grip    Theraputty - Pinch  lateral pinch, thumb pull to promote ip flexion 5 times each       Manual Therapy    Manual Therapy  Myofascial release    Manual therapy comments  manual therapy completed seperately from all other interventions this date    Myofascial Release  myofascial release and manual stretching to right flexor and extensor forearm, wrist, thenar eminence, and thumb to improve range of motion to [facility]      Fine Motor Coordination (Hand/Wrist)   Fine Motor Coordination  In hand manipuation training;Grooved pegs    In Hand Manipulation Training  in hand manipulation to promote IP flexion as object moved from pincer grasp to palm and back     Grooved pegs  placed pegs in pegboard iwth emphasis on IP flexion when manipulating pegs.  max difficulty to flex ip during functional activity              OT Education - 10/05/18 2139    Education Details  reviewed HEP and issued a second copy of the handout    Person(s) Educated  Patient    Methods  Explanation;Demonstration;Handout    Comprehension  Verbalized understanding;Returned demonstration       OT Short Term Goals - 10/05/18 2143      OT SHORT TERM GOAL #1   Title  Pt will be provided with and educated on HEP for improved use of right hand as dominant during ADL tasks.    Time  4    Period  Weeks    Status  On-going    Target Date  10/29/18      OT SHORT TERM GOAL #2   Title  Pt will decrease pain in right hand to 3/10 or less to improve ability to perform gardening tasks.    Time  4    Period  Weeks    Status  On-going      OT SHORT TERM GOAL #3   Title  Pt will increase right thumb IP ROM to [facility] to improve ability to operate scissors during daily and leisure tasks.    Time  4    Period  Weeks    Status  On-going      OT SHORT TERM GOAL #4   Title  Pt will increase right grip strength by  10# and pinch strength by 3# to improve ability to open jars and lids.    Time  4    Period  Weeks    Status  On-going               Plan - 10/05/18 2140    Clinical Impression Statement  A:  Patient has [facility] P/ROM of  IP joint, A/rom is significantly decreased and with increased pain.    OT Treatment/Interventions  Self-care/ADL training;Ultrasound;Patient/family education;Passive range of motion;Cryotherapy;Electrical Stimulation;Moist Heat;Therapeutic exercise;Manual Therapy;Therapeutic activities    Plan  P:  continue to increase a/rom in IP joint with decreased pain.  attempt to improve ability to maintain sustained flexion with flex and hold exercises.       Patient will benefit from skilled therapeutic intervention in order to improve the following deficits and impairments:           Visit Diagnosis: 1. Other symptoms and signs involving the musculoskeletal system   2. Pain in joint of right hand       Problem List Patient Active Problem List   Diagnosis Date Noted  . Vitamin D deficiency 08/04/2018  . Avulsion fracture of phalanx of right thumb 07/09/2018  . Essential hypertension 11/17/2017  . Elevated sed rate 10/15/2017  . Sphenoid sinusitis 10/15/2017  . Obesity 07/15/2017  . Insomnia 07/15/2017  . Female stress incontinence 07/15/2017  . Anxiety 07/15/2017  . Anal fissure 02/25/2017  . Gastroesophageal reflux disease with esophagitis 02/25/2017  . Vertigo 01/05/2016  . Medicare annual wellness visit, subsequent 11/29/2015  . Osteopenia 06/27/2015  . Routine general medical examination at a health care facility 01/06/2015  . Osteoarthritis of right hip 07/08/2014  . Sinus bradycardia 08/27/2012  . Depression   . Carotid artery disease ([place])   . PVC (premature ventricular contraction)   . Aortic valve sclerosis   . Mixed hyperlipidemia   . Coronary artery disease   . CAD (coronary artery disease)     Lynelle Doctor, OTR/L [phone]  10/05/2018, 9:50 PM  [place] [address] [place], [place], [address] Phone: [phone]   Fax:  [phone]  Name: JOHNYE KIST MRN: [id number] Date of Birth: [birth year]

## 2018-10-06 ENCOUNTER — Encounter (INDEPENDENT_AMBULATORY_CARE_PROVIDER_SITE_OTHER): Payer: Self-pay | Admitting: Family Medicine

## 2018-10-06 ENCOUNTER — Ambulatory Visit (INDEPENDENT_AMBULATORY_CARE_PROVIDER_SITE_OTHER): Payer: Medicare HMO | Admitting: Family Medicine

## 2018-10-06 VITALS — BP 145/69 | HR 50 | Temp 97.7°F | Ht 60.0 in | Wt 140.0 lb

## 2018-10-06 DIAGNOSIS — I10 Essential (primary) hypertension: Secondary | ICD-10-CM

## 2018-10-06 DIAGNOSIS — E669 Obesity, unspecified: Secondary | ICD-10-CM | POA: Diagnosis not present

## 2018-10-06 DIAGNOSIS — E559 Vitamin D deficiency, unspecified: Secondary | ICD-10-CM

## 2018-10-06 DIAGNOSIS — Z6830 Body mass index (BMI) 30.0-30.9, adult: Secondary | ICD-10-CM

## 2018-10-06 MED ORDER — VITAMIN D (ERGOCALCIFEROL) 1.25 MG (50000 UNIT) PO CAPS: 50000.0000 [IU] | ORAL_CAPSULE | ORAL | 0 refills | Status: DC

## 2018-10-06 NOTE — Progress Notes (Signed)
Cardiology Office Note    Date:  10/07/2018   ID:  Courtney [name], DOB 23-Apr-1950, MRN [id number]  PCP:  Marrian Salvage, [place]  Cardiologist:  Peter Martinique, MD    History of Present Illness:  Courtney [name] is a 68 y.o. female seen for follow up CAD.  She has a history of CAD. Presented in 2009 and had a BMS of the mid RCA. She states she had 2 stents. In 2011 she had a mildly abnormal stress test and repeat cardiac cath showed nonobstructive CAD. In 2014 she had a normal Myoview study and again in October 2018. [name] She also has a history of mild carotid disease, HTN, PVCs, and obesity. She is retired as a Retail buyer for [employer].   On follow up today she reports she is doing very well. No cardiac complaints. Specifically denies chest pain or SOB. No edema or palpitations. 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 She is still on Dr Migdalia Dk weight control program. She does note BP has been consistently > 140 recently.    Past Medical History:  Diagnosis Date  . Aortic valve sclerosis    echo 63/7858, soft systolic murmur  . 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 Back pain   . Carotid artery disease ([place])    40-59% bilateral, doppler December 2010  . Constipation   . Coronary artery disease    a. BMS-mid RCA 11/2007 b. stress echo 02/2009 subtle inferior HK, lateral ST depressions with stress c. follow-up cath 02/2009: RCA stent patent, severe but stable stenosis of distal posterior lateral branch RCA. LV normal, med tx unless more sx c. ETT Myoview 08/27/12: negative for ischemia; EF 64%  . Decreased hearing   . Depression    Mild, August, 2012  . Dyslipidemia   . Easy bruising   . Ejection fraction    EF normal, catheterization, 2011 /  EF 55%, echo, 2010  . Fluid overload    Mild, stable since hospitalization in the past  . GERD (gastroesophageal reflux disease)    occarionally, takes Pepcid over the counter  . Heart murmur    has, occasional PVC's  .  History of right hip replacement   . Hyperlipidemia   . Hypertension   . Joint pain   . Myocardial infarction ([place])    2009  . Palpitations   . PVC (premature ventricular contraction)    per prior Holter monitor  . Right hip pain 12/2009  . Shellfish allergy   . Trouble in sleeping   . Vitamin B12 deficiency   . Vitamin D deficiency     Past Surgical History:  Procedure Laterality Date  . 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/01/2005  . carcinoid tumor removal    . CARDIAC CATHETERIZATION  1/211   Patent RCA stent, stenotic PLB supplying small distribution; medically managed  . CORONARY ANGIOPLASTY WITH STENT PLACEMENT  11/2007   BMS-mid RCA  . DIAGNOSTIC LAPAROSCOPY     1982 for endometriosis  . DILATION AND CURETTAGE OF UTERUS  2005  . hx of wisdom teeth surgery    . SKIN CANCER EXCISION    . TOTAL HIP ARTHROPLASTY Right 07/08/2014   Procedure: RIGHT TOTAL HIP ARTHROPLASTY ANTERIOR APPROACH;  Surgeon: Mcarthur Rossetti, MD;  Location: [facility];  Service: Orthopedics;  Laterality: Right;    Current Medications: Outpatient Medications Prior to Visit  Medication Sig Dispense Refill  . acetaminophen (TYLENOL) 500 MG tablet Take  1,000 mg by mouth every 6 (six) hours as needed (Pain).     [name] aspirin 81 MG tablet Take 81 mg by mouth daily.    . diclofenac sodium (VOLTAREN) 1 % GEL Apply 2 g topically 4 (four) times daily. Rub into affected area of foot 2 to 4 times daily 100 g 2  . ezetimibe (ZETIA) 10 MG tablet Take 1 tablet (10 mg total) by mouth daily. 90 tablet 3  . metoprolol tartrate (LOPRESSOR) 25 MG tablet Take 0.5 tablets (12.5 mg total) by mouth 2 (two) times daily. 90 tablet 3  . nitroGLYCERIN (NITROSTAT) 0.4 MG SL tablet Place 1 tablet (0.4 mg total) under the tongue every 5 (five) minutes as needed for chest pain. 25 tablet 3  . pantoprazole (PROTONIX) 40 MG tablet Take 1 tablet (40 mg total) by mouth daily. 30 tablet 3  . rosuvastatin (CRESTOR) 40 MG tablet Take 1 tablet (40 mg  total) by mouth daily. 90 tablet 3  . Vitamin D, Ergocalciferol, (DRISDOL) 1.25 MG (50000 UT) CAPS capsule Take 1 capsule (50,000 Units total) by mouth every 14 (fourteen) days. 2 capsule 0  . ramipril (ALTACE) 5 MG capsule Take 1 capsule (5 mg total) by mouth daily. 90 capsule 3   No facility-administered medications prior to visit.      Allergies:   Erythromycin, Penicillins, and Shellfish allergy   Social History   Socioeconomic History  . Marital status: Divorced    Spouse name: Not on file  . Number of children: 1  . Years of education: 57  . Highest education level: Not on file  Occupational History  . Occupation: Retired - Retail buyer  Social Needs  . Financial resource strain: Not hard at all  . Food insecurity    Worry: Never true    Inability: Never true  . Transportation needs    Medical: No    Non-medical: No  Tobacco Use  . Smoking status: Never Smoker  . Smokeless tobacco: Never Used  Substance and Sexual Activity  . Alcohol use: Yes    Alcohol/week: 7.0 standard drinks    Types: 7 Glasses of wine per week    Comment: 1 glass of wine/night  . Drug use: No  . Sexual activity: Yes  Lifestyle  . Physical activity    Days per week: 3 days    Minutes per session: 30 min  . Stress: Not at all  Relationships  . Social connections    Talks on phone: More than three times a week    Gets together: More than three times a week    Attends religious service: Not on file    Active member of club or organization: Not on file    Attends meetings of clubs or organizations: Not on file    Relationship status: Living with partner  Other Topics Concern  . Not on file  Social History Narrative   Fun: Dance, garden,    Denies religious beliefs effecting health care.   Denies abuse and feels safe at home     Family History:  The patient's family history includes Heart attack in her paternal grandfather; Heart attack (age of onset: 83) in her father; Heart disease  in her mother and sister; Hyperlipidemia in her father and mother; Hypertension in her father and mother; Obesity in her mother; Stroke in her mother; Sudden death in her father.   ROS:   Please see the history of present illness.    ROS All other  systems reviewed and are negative.   PHYSICAL EXAM:   VS:  BP 140/68   Pulse (!) 48   Temp (!) 96.6 F (35.9 C)   Ht 5' (1.524 m)   Wt 140 lb (63.5 kg)   SpO2 97%   BMI 27.34 kg/m    GENERAL:  Well appearing, overweight WF in NAD HEENT:  PERRL, EOMI, sclera are clear. Oropharynx is clear. NECK:  No jugular venous distention, carotid upstroke brisk and symmetric, she has a right carotid/subclavian bruit, no thyromegaly or adenopathy LUNGS:  Clear to auscultation bilaterally CHEST:  Unremarkable HEART:  RRR,  PMI not displaced or sustained,S1 and S2 within normal limits, no S3, no S4: no clicks, no rubs, gr 1/6 systolic murmur RUSB ABD:  Soft, nontender. BS +, no masses or bruits. No hepatomegaly, no splenomegaly EXT:  2 + pulses throughout, no edema, no cyanosis no clubbing SKIN:  Warm and dry.  No rashes NEURO:  Alert and oriented x 3. Cranial nerves II through XII intact. PSYCH:  Cognitively intact    Wt Readings from Last 3 Encounters:  10/07/18 140 lb (63.5 kg)  10/06/18 140 lb (63.5 kg)  09/14/18 139 lb (63 kg)      Studies/Labs Reviewed:   EKG:  EKG is  ordered today.  Sinus brady rate 48. Otherwise normal. I have personally reviewed and interpreted this study.    Recent Labs: 10/14/2017: Platelets 235 03/03/2018: Hemoglobin 13.2 09/14/2018: ALT 20; BUN 20; Creatinine, Ser 0.74; Potassium 4.3; Sodium 141   Lipid Panel    Component Value Date/Time   CHOL 156 09/14/2018 1217   TRIG 85 09/14/2018 1217   HDL 74 09/14/2018 1217   CHOLHDL 2.5 10/29/2016 1030   CHOLHDL 3 11/30/2015 1028   VLDL 25.4 11/30/2015 1028   LDLCALC 65 09/14/2018 1217    Additional studies/ records that were reviewed today include:  Myoview  08/02/16: Study Highlights     The left ventricular ejection fraction is hyperdynamic (>65%).  Nuclear stress EF: 66%.  Blood pressure demonstrated a hypertensive response to exercise.  Horizontal ST segment depression ST segment depression of 1 mm was noted during stress in the II, III and aVF leads.  This is a low risk study.   ECG positive with 1 mm ST depression inferior leads peak stress and recovery However perfusion images normal with no ischemia or infarct EF 66% HTN response to exercise        ASSESSMENT:    1. Coronary artery disease involving native coronary artery of native heart without angina pectoris   2. Pure hypercholesterolemia   3. Essential hypertension   4. Bruit of right carotid artery      PLAN:  In order of problems listed above:  1. She remains asymptomatic. Myoview in October 2018 was normal. Continue current medical therapy with ASA, statin, metoprolol and ramipril. Encourage continued efforts at weight loss and exercise.  2. LDL improved from 98 to 74 to 65 with addition of Zetia. Continue  high dose statin and Zetia.  3. HTN. Not at goal. Recommend increase in ramipril to 10 mg daily 4. Carotid bruit. Will update carotid dopplers. Last done in 2016.   I will follow up in one year.    Medication Adjustments/Labs and Tests Ordered: Current medicines are reviewed at length with the patient today.  Concerns regarding medicines are outlined above.  Medication changes, Labs and Tests ordered today are listed in the Patient Instructions below. Patient Instructions  Increase ramipril to  10 mg daily  We will schedule you for carotid dopplers  Follow up  In one year     Signed, Peter Martinique, MD  10/07/2018 8:43 AM    [place] [address] [place], [place], [address] [phone]

## 2018-10-07 ENCOUNTER — Encounter: Payer: Self-pay | Admitting: Cardiology

## 2018-10-07 ENCOUNTER — Other Ambulatory Visit: Payer: Self-pay

## 2018-10-07 ENCOUNTER — Ambulatory Visit (INDEPENDENT_AMBULATORY_CARE_PROVIDER_SITE_OTHER): Payer: Medicare HMO | Admitting: Cardiology

## 2018-10-07 VITALS — BP 140/68 | HR 48 | Temp 96.6°F | Ht 60.0 in | Wt 140.0 lb

## 2018-10-07 DIAGNOSIS — E559 Vitamin D deficiency, unspecified: Secondary | ICD-10-CM | POA: Diagnosis not present

## 2018-10-07 DIAGNOSIS — I2510 Atherosclerotic heart disease of native coronary artery without angina pectoris: Secondary | ICD-10-CM

## 2018-10-07 DIAGNOSIS — I10 Essential (primary) hypertension: Secondary | ICD-10-CM | POA: Diagnosis not present

## 2018-10-07 DIAGNOSIS — Z6830 Body mass index (BMI) 30.0-30.9, adult: Secondary | ICD-10-CM | POA: Diagnosis not present

## 2018-10-07 DIAGNOSIS — R0989 Other specified symptoms and signs involving the circulatory and respiratory systems: Secondary | ICD-10-CM

## 2018-10-07 DIAGNOSIS — E7800 Pure hypercholesterolemia, unspecified: Secondary | ICD-10-CM | POA: Diagnosis not present

## 2018-10-07 DIAGNOSIS — E669 Obesity, unspecified: Secondary | ICD-10-CM | POA: Diagnosis not present

## 2018-10-07 MED ORDER — RAMIPRIL 10 MG PO CAPS: 10.0000 mg | ORAL_CAPSULE | Freq: Every day | ORAL | 3 refills | Status: DC

## 2018-10-07 MED ORDER — NITROGLYCERIN 0.4 MG SL SUBL: 0.4000 mg | SUBLINGUAL_TABLET | SUBLINGUAL | 11 refills | Status: DC | PRN

## 2018-10-07 MED ORDER — EZETIMIBE 10 MG PO TABS: 10.0000 mg | ORAL_TABLET | Freq: Every day | ORAL | 3 refills | Status: DC

## 2018-10-07 MED ORDER — ROSUVASTATIN CALCIUM 40 MG PO TABS: 40.0000 mg | ORAL_TABLET | Freq: Every day | ORAL | 3 refills | Status: DC

## 2018-10-07 MED ORDER — METOPROLOL TARTRATE 25 MG PO TABS: 12.5000 mg | ORAL_TABLET | Freq: Two times a day (BID) | ORAL | 3 refills | Status: DC

## 2018-10-07 NOTE — Addendum Note (Signed)
Addended by: Kathyrn Lass on: 10/07/2018 10:21 AM   Modules accepted: Orders

## 2018-10-07 NOTE — Patient Instructions (Signed)
Increase ramipril to 10 mg daily  We will schedule you for carotid dopplers  Follow up  In one year

## 2018-10-09 ENCOUNTER — Encounter (HOSPITAL_COMMUNITY): Payer: Self-pay | Admitting: Specialist

## 2018-10-09 ENCOUNTER — Other Ambulatory Visit: Payer: Self-pay

## 2018-10-09 ENCOUNTER — Ambulatory Visit (HOSPITAL_COMMUNITY): Payer: Medicare HMO | Admitting: Specialist

## 2018-10-09 DIAGNOSIS — R29898 Other symptoms and signs involving the musculoskeletal system: Secondary | ICD-10-CM | POA: Diagnosis not present

## 2018-10-09 DIAGNOSIS — M25541 Pain in joints of right hand: Secondary | ICD-10-CM | POA: Diagnosis not present

## 2018-10-09 NOTE — Therapy (Signed)
[place], [place], [address] Phone: [phone]   Fax:  [phone]  Occupational Therapy Treatment  Patient Details  Name: Courtney [name] MRN: [id number] Date of Birth: Feb 27, 1950 Referring Provider (OT): Dr. Hulan Saas   Encounter Date: 10/09/2018  OT End of Session - 10/09/18 0848    Visit Number  3    Number of Visits  8    Date for OT Re-Evaluation  10/29/18    Authorization Type  [employer], $40 copay    Authorization Time Period  no visit limit    OT Start Time  0830    OT Stop Time  0910    OT Time Calculation (min)  40 min    Activity Tolerance  Patient tolerated treatment well    Behavior During Therapy  [facility] for tasks assessed/performed       Past Medical History:  Diagnosis Date  . Aortic valve sclerosis    echo 123456, soft systolic murmur  . 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 Back pain   . Carotid artery disease ([place])    40-59% bilateral, doppler December 2010  . Constipation   . Coronary artery disease    a. BMS-mid RCA 11/2007 b. stress echo 02/2009 subtle inferior HK, lateral ST depressions with stress c. follow-up cath 02/2009: RCA stent patent, severe but stable stenosis of distal posterior lateral branch RCA. LV normal, med tx unless more sx c. ETT Myoview 08/27/12: negative for ischemia; EF 64%  . Decreased hearing   . Depression    Mild, August, 2012  . Dyslipidemia   . Easy bruising   . Ejection fraction    EF normal, catheterization, 2011 /  EF 55%, echo, 2010  . Fluid overload    Mild, stable since hospitalization in the past  . GERD (gastroesophageal reflux disease)    occarionally, takes Pepcid over the counter  . Heart murmur    has, occasional PVC's  . History of right hip replacement   . Hyperlipidemia   . Hypertension   . Joint pain   . Myocardial infarction ([place])    2009  . Palpitations   . PVC (premature ventricular contraction)    per prior Holter monitor   . Right hip pain 12/2009  . Shellfish allergy   . Trouble in sleeping   . Vitamin B12 deficiency   . Vitamin D deficiency     Past Surgical History:  Procedure Laterality Date  . 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/01/2005  . carcinoid tumor removal    . CARDIAC CATHETERIZATION  1/211   Patent RCA stent, stenotic PLB supplying small distribution; medically managed  . CORONARY ANGIOPLASTY WITH STENT PLACEMENT  11/2007   BMS-mid RCA  . DIAGNOSTIC LAPAROSCOPY     1982 for endometriosis  . DILATION AND CURETTAGE OF UTERUS  2005  . hx of wisdom teeth surgery    . SKIN CANCER EXCISION    . TOTAL HIP ARTHROPLASTY Right 07/08/2014   Procedure: RIGHT TOTAL HIP ARTHROPLASTY ANTERIOR APPROACH;  Surgeon: Mcarthur Rossetti, MD;  Location: [facility];  Service: Orthopedics;  Laterality: Right;    There were no vitals filed for this visit.  Subjective Assessment - 10/09/18 0848    Subjective   S:  If I bend it too much it pops.    Currently in Pain?  Yes    Pain Score  1     Pain Location  Hand   thumb  Pain Orientation  Right    Pain Descriptors / Indicators  Aching    Pain Type  Acute pain         [facility] OT Assessment - 10/09/18 0001      Assessment   Medical Diagnosis  closed avulsion fx of right thumb    Referring Provider (OT)  Dr. Hulan Saas      Precautions   Precautions  None               OT Treatments/Exercises (OP) - 10/09/18 0001      Exercises   Exercises  Wrist;Hand;Theraputty      Additional Wrist Exercises   Sponges  28 X3       Hand Exercises   MCPJ Flexion  PROM;AROM;10 reps    PIPJ Flexion  PROM;AROM;10 reps    Joint Blocking Exercises  10 times each joint at thumb passive and acitve     Thumb Opposition  to MCPJ of SF 5 times     Other Hand Exercises  pinch tree, placed and removed yellow and red clothespins from bucket to top horizontal bar using lateral pinch, ipj remained extended, with vg from OT, patient able to flexion IPJ approx 10 degrees  with moderate pain/difficulty    Other Hand Exercises  eggcercizer orange to promote IPJ flexion, using lateral side of thumb, when cued to use tip of thumb for increased flexion, popping sensation audible in IPJ       Theraputty   Theraputty - Flatten  pink in standing, completed pvc pulls for improved wrist and hand stability and ipj flexion while grasping pvc pipe     Theraputty - Roll  pink    Theraputty - Grip  pink supinated and pronated    Theraputty - Pinch  lateral pinch, thumb pull to promote ip flexion 5 times each     Theraputty Hand- Locate Pegs  10 beads using thumb and index finger to locate beads       Manual Therapy   Manual Therapy  Myofascial release    Manual therapy comments  manual therapy completed seperately from all other interventions this date    Myofascial Release  myofascial release and manual stretching to right flexor and extensor forearm, wrist, thenar eminence, and thumb to improve range of motion to [facility]      Fine Motor Coordination (Hand/Wrist)   Grooved pegs  placed pegs in pegboard using pincer grasp removed 5 at a time to promote IPJ flexion for translation from pincer grasp to palm.                 OT Short Term Goals - 10/05/18 2143      OT SHORT TERM GOAL #1   Title  Pt will be provided with and educated on HEP for improved use of right hand as dominant during ADL tasks.    Time  4    Period  Weeks    Status  On-going    Target Date  10/29/18      OT SHORT TERM GOAL #2   Title  Pt will decrease pain in right hand to 3/10 or less to improve ability to perform gardening tasks.    Time  4    Period  Weeks    Status  On-going      OT SHORT TERM GOAL #3   Title  Pt will increase right thumb IP ROM to [facility] to improve ability to operate scissors during daily and leisure  tasks.    Time  4    Period  Weeks    Status  On-going      OT SHORT TERM GOAL #4   Title  Pt will increase right grip strength by 10# and pinch strength by 3# to  improve ability to open jars and lids.    Time  4    Period  Weeks    Status  On-going               Plan - 10/09/18 0915    Clinical Impression Statement  A:  Patient had improved A/ROM in IPJ this date, however patient continues to experience increased pain with flexion past 20 degrees.  With verbal guidance, patient able to volitionally flex IPJ.  However, without cuing, compensates with extended IPJ/lateral pinch.    Body Structure / Function / Physical Skills  ADL;UE functional use;Pain;ROM;IADL;Strength    Plan  P:  continue to increase a/rom in IP joint with decreased pain.  attempt to improve ability to maintain sustained flexion with eggcercizer exercise.       Patient will benefit from skilled therapeutic intervention in order to improve the following deficits and impairments:   Body Structure / Function / Physical Skills: ADL, UE functional use, Pain, ROM, IADL, Strength       Visit Diagnosis: Other symptoms and signs involving the musculoskeletal system  Pain in joint of right hand    Problem List Patient Active Problem List   Diagnosis Date Noted  . Vitamin D deficiency 08/04/2018  . Avulsion fracture of phalanx of right thumb 07/09/2018  . Essential hypertension 11/17/2017  . Elevated sed rate 10/15/2017  . Sphenoid sinusitis 10/15/2017  . Obesity 07/15/2017  . Insomnia 07/15/2017  . Female stress incontinence 07/15/2017  . Anxiety 07/15/2017  . Anal fissure 02/25/2017  . Gastroesophageal reflux disease with esophagitis 02/25/2017  . Vertigo 01/05/2016  . Medicare annual wellness visit, subsequent 11/29/2015  . Osteopenia 06/27/2015  . Routine general medical examination at a health care facility 01/06/2015  . Osteoarthritis of right hip 07/08/2014  . Sinus bradycardia 08/27/2012  . Depression   . Carotid artery disease ([place])   . PVC (premature ventricular contraction)   . Aortic valve sclerosis   . Mixed hyperlipidemia   . Coronary artery  disease   . CAD (coronary artery disease)     Lynelle Doctor, OTR/L [phone]  10/09/2018, 9:17 AM  [place], [place], [address] Phone: [phone]   Fax:  [phone]  Name: JULEE TUY MRN: [id number] Date of Birth: [birth year]

## 2018-10-13 NOTE — Progress Notes (Signed)
Office: [phone]  /  Fax: [phone]   HPI:   Chief Complaint: OBESITY Jimi is here to discuss her progress with her obesity treatment plan. She is on the Category 1 plan + 100 calories and is following her eating plan approximately 95 % of the time. She states she is exercising 0 minutes 0 times per week. Shawntia recently went back to work (works 1-9 pm at [employer] center for [employer]). This return to work has changed her eating habits so she is often skipping breakfast, and then snacking later when she returns from work.  Her weight is 140 lb (63.5 kg) today and has gained 1 lb since her last visit. She has lost 17 lbs since starting treatment with [place].  Vitamin D Deficiency Khyleigh has a diagnosis of vitamin D deficiency. She is currently taking prescription Vit D. She notes fatigue and denies nausea, vomiting or muscle weakness.  Hypertension ETHELEEN VALTIERRA is a 68 y.o. female with hypertension. Kiya's blood pressure was controlled previously. She denies chest pain, chest pressure, or headaches. She asked for a small blood pressure cuff to be used today. She is working on weight loss to help control her blood pressure with the goal of decreasing her risk of heart attack and stroke.   ASSESSMENT AND PLAN:  Vitamin D deficiency - Plan: Vitamin D, Ergocalciferol, (DRISDOL) 1.25 MG (50000 UT) CAPS capsule  Essential hypertension  Class 1 obesity with serious comorbidity and body mass index (BMI) of 30.0 to 30.9 in adult, unspecified obesity type - Bmi Greater than 30 at start of program   PLAN:  Vitamin D Deficiency Diamantina was informed that low vitamin D levels contributes to fatigue and are associated with obesity, breast, and colon cancer. Julie agrees to continue taking prescription Vit D 50,000 IU every 14 days #2 and we will refill for 1 month. She will follow up for routine testing of vitamin D, at least 2-3 times per year. She was informed of the risk of over-replacement of  vitamin D and agrees to not increase her dose unless she discusses this with [place] first. Nevah agrees to follow up with our clinic in 2 weeks.  Hypertension We discussed sodium restriction, working on healthy weight loss, and a regular exercise program as the means to achieve improved blood pressure control. Timara agreed with this plan and agreed to follow up as directed. We will continue to monitor her blood pressure as well as her progress with the above lifestyle modifications. Jahyra agrees to continue her current medications and will watch for signs of hypotension as she continues her lifestyle modifications. We will follow up on her blood pressure at her next appointment. Christmas agrees to follow up with our clinic in 2 weeks.  Obesity Carina is currently in the action stage of change. As such, her goal is to continue with weight loss efforts She has agreed to follow the Category 1 plan + 100 calories [name] has been instructed to work up to a goal of 150 minutes of combined cardio and strengthening exercise per week for weight loss and overall health benefits. We discussed the following Behavioral Modification Strategies today: increasing lean protein intake, increasing vegetables and work on meal planning and easy cooking plans, keeping healthy foods in the home, better snacking choices, and planning for success   Rabiah has agreed to follow up with our clinic in 2 weeks. She was informed of the importance of frequent follow up visits to maximize her success with  intensive lifestyle modifications for her multiple health conditions.  ALLERGIES: Allergies  Allergen Reactions  . Erythromycin Hives  . Penicillins Hives    Has patient had a PCN reaction causing immediate rash, facial/tongue/throat swelling, SOB or lightheadedness with hypotension: No Has patient had a PCN reaction causing severe rash involving mucus membranes or skin necrosis: No Has patient had a PCN reaction that required  hospitalization: No Has patient had a PCN reaction occurring within the last 10 years: No If all of the above answers are "NO", then may proceed with Cephalosporin use.   [name] Shellfish Allergy Hives    MEDICATIONS: Current Outpatient Medications on File Prior to Visit  Medication Sig Dispense Refill  . acetaminophen (TYLENOL) 500 MG tablet Take 1,000 mg by mouth every 6 (six) hours as needed (Pain).     [name] aspirin 81 MG tablet Take 81 mg by mouth daily.    . diclofenac sodium (VOLTAREN) 1 % GEL Apply 2 g topically 4 (four) times daily. Rub into affected area of foot 2 to 4 times daily 100 g 2  . pantoprazole (PROTONIX) 40 MG tablet Take 1 tablet (40 mg total) by mouth daily. 30 tablet 3   No current facility-administered medications on file prior to visit.     PAST MEDICAL HISTORY: Past Medical History:  Diagnosis Date  . Aortic valve sclerosis    echo 57/2620, soft systolic murmur  . 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 Back pain   . Carotid artery disease ([place])    40-59% bilateral, doppler December 2010  . Constipation   . Coronary artery disease    a. BMS-mid RCA 11/2007 b. stress echo 02/2009 subtle inferior HK, lateral ST depressions with stress c. follow-up cath 02/2009: RCA stent patent, severe but stable stenosis of distal posterior lateral branch RCA. LV normal, med tx unless more sx c. ETT Myoview 08/27/12: negative for ischemia; EF 64%  . Decreased hearing   . Depression    Mild, August, 2012  . Dyslipidemia   . Easy bruising   . Ejection fraction    EF normal, catheterization, 2011 /  EF 55%, echo, 2010  . Fluid overload    Mild, stable since hospitalization in the past  . GERD (gastroesophageal reflux disease)    occarionally, takes Pepcid over the counter  . Heart murmur    has, occasional PVC's  . History of right hip replacement   . Hyperlipidemia   . Hypertension   . Joint pain   . Myocardial infarction ([place])    2009  . Palpitations   . PVC (premature  ventricular contraction)    per prior Holter monitor  . Right hip pain 12/2009  . Shellfish allergy   . Trouble in sleeping   . Vitamin B12 deficiency   . Vitamin D deficiency     PAST SURGICAL HISTORY: Past Surgical History:  Procedure Laterality Date  . 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/01/2005  . carcinoid tumor removal    . CARDIAC CATHETERIZATION  1/211   Patent RCA stent, stenotic PLB supplying small distribution; medically managed  . CORONARY ANGIOPLASTY WITH STENT PLACEMENT  11/2007   BMS-mid RCA  . DIAGNOSTIC LAPAROSCOPY     1982 for endometriosis  . DILATION AND CURETTAGE OF UTERUS  2005  . hx of wisdom teeth surgery    . SKIN CANCER EXCISION    . TOTAL HIP ARTHROPLASTY Right 07/08/2014   Procedure: RIGHT TOTAL HIP ARTHROPLASTY ANTERIOR APPROACH;  Surgeon: Mcarthur Rossetti,  MD;  Location: [facility];  Service: Orthopedics;  Laterality: Right;    SOCIAL HISTORY: Social History   Tobacco Use  . Smoking status: Never Smoker  . Smokeless tobacco: Never Used  Substance Use Topics  . Alcohol use: Yes    Alcohol/week: 7.0 standard drinks    Types: 7 Glasses of wine per week    Comment: 1 glass of wine/night  . Drug use: No    FAMILY HISTORY: Family History  Problem Relation Age of Onset  . Heart attack Father 59  . Hyperlipidemia Father   . Hypertension Father   . Sudden death Father   . Stroke Mother   . Hyperlipidemia Mother   . Hypertension Mother   . Heart disease Mother   . Obesity Mother   . Heart attack Paternal Grandfather   . Heart disease Sister   . BRCA 1/2 Neg Hx   . Breast cancer Neg Hx     ROS: Review of Systems  Constitutional: Positive for malaise/fatigue. Negative for weight loss.  Cardiovascular: Negative for chest pain.       Negative chest pressure  Gastrointestinal: Negative for nausea and vomiting.  Musculoskeletal:       Negative muscle weakness  Neurological: Negative for headaches.    PHYSICAL EXAM: Blood pressure (!) 145/69,  pulse (!) 50, temperature 97.7 F (36.5 C), temperature source Oral, height 5' (1.524 m), weight 140 lb (63.5 kg), SpO2 99 %. Body mass index is 27.34 kg/m. Physical Exam Vitals signs reviewed.  Constitutional:      Appearance: Normal appearance. She is obese.  Cardiovascular:     Rate and Rhythm: Normal rate.     Pulses: Normal pulses.  Pulmonary:     Effort: Pulmonary effort is normal.     Breath sounds: Normal breath sounds.  Musculoskeletal: Normal range of motion.  Skin:    General: Skin is warm and dry.  Neurological:     Mental Status: She is alert and oriented to person, place, and time.  Psychiatric:        Mood and Affect: Mood normal.        Behavior: Behavior normal.     RECENT LABS AND TESTS: BMET    Component Value Date/Time   NA 141 09/14/2018 1217   K 4.3 09/14/2018 1217   CL 103 09/14/2018 1217   CO2 24 09/14/2018 1217   GLUCOSE 99 09/14/2018 1217   GLUCOSE 92 10/14/2017 1923   BUN 20 09/14/2018 1217   CREATININE 0.74 09/14/2018 1217   CALCIUM 9.6 09/14/2018 1217   GFRNONAA 83 09/14/2018 1217   GFRAA 96 09/14/2018 1217   Lab Results  Component Value Date   HGBA1C 5.4 09/14/2018   HGBA1C 5.6 03/03/2018   HGBA1C 5.6 10/15/2017   HGBA1C 5.6 06/16/2017   Lab Results  Component Value Date   INSULIN 4.2 09/14/2018   INSULIN 5.0 03/03/2018   INSULIN 5.9 10/15/2017   INSULIN 6.4 06/16/2017   CBC    Component Value Date/Time   WBC 7.4 03/03/2018 1116   WBC 12.2 (H) 10/14/2017 1923   RBC 4.20 03/03/2018 1116   RBC 4.21 10/14/2017 1923   HGB 13.2 03/03/2018 1116   HCT 39.3 03/03/2018 1116   PLT 235 10/14/2017 1923   MCV 94 03/03/2018 1116   MCH 31.4 03/03/2018 1116   MCH 31.8 10/14/2017 1923   MCHC 33.6 03/03/2018 1116   MCHC 33.8 10/14/2017 1923   RDW 11.9 03/03/2018 1116   LYMPHSABS 3.0 03/03/2018 1116  MONOABS 0.8 10/14/2017 1923   EOSABS 0.4 03/03/2018 1116   BASOSABS 0.1 03/03/2018 1116   Iron/TIBC/Ferritin/ %Sat    Component  Value Date/Time   IRON 62 01/09/2015 0806   FERRITIN 26.6 01/09/2015 0806   IRONPCTSAT 15.2 (L) 01/09/2015 0806   Lipid Panel     Component Value Date/Time   CHOL 156 09/14/2018 1217   TRIG 85 09/14/2018 1217   HDL 74 09/14/2018 1217   CHOLHDL 2.5 10/29/2016 1030   CHOLHDL 3 11/30/2015 1028   VLDL 25.4 11/30/2015 1028   LDLCALC 65 09/14/2018 1217   Hepatic Function Panel     Component Value Date/Time   PROT 6.4 09/14/2018 1217   ALBUMIN 4.6 09/14/2018 1217   AST 23 09/14/2018 1217   ALT 20 09/14/2018 1217   ALKPHOS 66 09/14/2018 1217   BILITOT 0.3 09/14/2018 1217   BILIDIR 0.07 10/29/2016 1030      Component Value Date/Time   TSH 0.931 06/16/2017 0000   TSH 1.43 01/09/2015 0806   TSH 0.876 08/26/2012 1722      OBESITY BEHAVIORAL INTERVENTION VISIT  Today's visit was # 21   Starting weight: 157 lbs Starting date: 06/16/17 Today's weight : 140 lbs Today's date: 10/06/2018 Total lbs lost to date: 17 At least 15 minutes were spent on discussing the following behavioral intervention visit.   ASK: We discussed the diagnosis of obesity with [name] today and Jocee agreed to give [place] permission to discuss obesity behavioral modification therapy today.  ASSESS: Shinita has the diagnosis of obesity and her BMI today is 27.34 Shamikia is in the action stage of change   ADVISE: Antwan was educated on the multiple health risks of obesity as well as the benefit of weight loss to improve her health. She was advised of the need for long term treatment and the importance of lifestyle modifications to improve her current health and to decrease her risk of future health problems.  AGREE: Multiple dietary modification options and treatment options were discussed and  Mary-Anne agreed to follow the recommendations documented in the above note.  ARRANGE: Qiara was educated on the importance of frequent visits to treat obesity as outlined per CMS and USPSTF guidelines and agreed to  schedule her next follow up appointment today.  I, Trixie Dredge, am acting as transcriptionist for Ilene Qua, MD  I have reviewed the above documentation for accuracy and completeness, and I agree with the above. - Ilene Qua, MD

## 2018-10-19 ENCOUNTER — Ambulatory Visit: Payer: Medicare HMO | Admitting: Family Medicine

## 2018-10-19 DIAGNOSIS — H2513 Age-related nuclear cataract, bilateral: Secondary | ICD-10-CM | POA: Diagnosis not present

## 2018-10-19 DIAGNOSIS — H02831 Dermatochalasis of right upper eyelid: Secondary | ICD-10-CM | POA: Diagnosis not present

## 2018-10-19 DIAGNOSIS — H268 Other specified cataract: Secondary | ICD-10-CM | POA: Diagnosis not present

## 2018-10-20 ENCOUNTER — Encounter (INDEPENDENT_AMBULATORY_CARE_PROVIDER_SITE_OTHER): Payer: Self-pay | Admitting: Family Medicine

## 2018-10-20 ENCOUNTER — Encounter: Payer: Self-pay | Admitting: Family Medicine

## 2018-10-20 ENCOUNTER — Ambulatory Visit (INDEPENDENT_AMBULATORY_CARE_PROVIDER_SITE_OTHER): Payer: Medicare HMO | Admitting: Family Medicine

## 2018-10-20 ENCOUNTER — Ambulatory Visit (INDEPENDENT_AMBULATORY_CARE_PROVIDER_SITE_OTHER)
Admission: RE | Admit: 2018-10-20 | Discharge: 2018-10-20 | Disposition: A | Discharge status: Home / Self Care | Payer: Medicare HMO | Source: Ambulatory Visit | Attending: Family Medicine | Admitting: Family Medicine

## 2018-10-20 ENCOUNTER — Ambulatory Visit: Payer: Self-pay

## 2018-10-20 ENCOUNTER — Other Ambulatory Visit: Payer: Self-pay

## 2018-10-20 VITALS — BP 144/70 | HR 52 | Ht 60.0 in | Wt 146.0 lb

## 2018-10-20 VITALS — BP 117/56 | HR 50 | Temp 97.4°F | Ht 60.0 in | Wt 143.0 lb

## 2018-10-20 DIAGNOSIS — G8929 Other chronic pain: Secondary | ICD-10-CM | POA: Diagnosis not present

## 2018-10-20 DIAGNOSIS — M79644 Pain in right finger(s): Secondary | ICD-10-CM

## 2018-10-20 DIAGNOSIS — S62501D Fracture of unspecified phalanx of right thumb, subsequent encounter for fracture with routine healing: Secondary | ICD-10-CM

## 2018-10-20 DIAGNOSIS — Z6830 Body mass index (BMI) 30.0-30.9, adult: Secondary | ICD-10-CM | POA: Diagnosis not present

## 2018-10-20 DIAGNOSIS — I10 Essential (primary) hypertension: Secondary | ICD-10-CM | POA: Diagnosis not present

## 2018-10-20 DIAGNOSIS — Z23 Encounter for immunization: Secondary | ICD-10-CM

## 2018-10-20 DIAGNOSIS — E66811 Obesity, class 1: Secondary | ICD-10-CM

## 2018-10-20 DIAGNOSIS — E669 Obesity, unspecified: Secondary | ICD-10-CM | POA: Diagnosis not present

## 2018-10-20 DIAGNOSIS — M19041 Primary osteoarthritis, right hand: Secondary | ICD-10-CM | POA: Diagnosis not present

## 2018-10-20 NOTE — Progress Notes (Signed)
Office: [phone]  /  Fax: [phone]   HPI:   Chief Complaint: OBESITY Courtney [name] is here to discuss her progress with her obesity treatment plan. She is on the Category 1 plan + 100 calories and is following her eating plan approximately 75 % of the time. She states she is walking for 30 minutes 3 times per week. [name] has been on vacation and had some celebration eating. She is retaining some fluid today, but is ready to get back on track. She is now working approximately 16 hours a week, and wants to know how to work her meals around her late schedule.  Her weight is 143 lb (64.9 kg) today and has gained 3 lbs since her last visit. She has lost 14 lbs since starting treatment with [place].  Hypertension Courtney [name] is a 68 y.o. female with hypertension. Altha's ramapril was increased with some elevated blood pressure. She is well controlled today. She denies chest pain, headaches, or dizziness. She is working on weight loss to help control her blood pressure with the goal of decreasing her risk of heart attack and stroke.   ASSESSMENT AND PLAN:  Essential hypertension  Class 1 obesity with serious comorbidity and body mass index (BMI) of 30.0 to 30.9 in adult, unspecified obesity type - Starting BMI greater then 30  PLAN:  Hypertension We discussed sodium restriction, working on healthy weight loss, and a regular exercise program as the means to achieve improved blood pressure control. [name] agreed with this plan and agreed to follow up as directed. We will continue to monitor her blood pressure as well as her progress with the above lifestyle modifications. [name] agrees to continue her medications, and continue diet and exercise, we discussed lower Na+ options. She will watch for signs of hypotension as she continues her lifestyle modifications. [name] agrees to follow up with our clinic in 2 to 3 weeks.  I spent > than 50% of the 15 minute visit on counseling as documented in the note.   Obesity Courtney [name] is currently in the action stage of change. As such, her goal is to continue with weight loss efforts She has agreed to follow the Category 1 plan [name] has been instructed to work up to a goal of 150 minutes of combined cardio and strengthening exercise per week for weight loss and overall health benefits. We discussed the following Behavioral Modification Strategies today: decreasing sodium intake and increase H20 intake   Courtney [name] has agreed to follow up with our clinic in 2 to 3 weeks. She was informed of the importance of frequent follow up visits to maximize her success with intensive lifestyle modifications for her multiple health conditions.  ALLERGIES: Allergies  Allergen Reactions  . Erythromycin Hives  . Penicillins Hives    Has patient had a PCN reaction causing immediate rash, facial/tongue/throat swelling, SOB or lightheadedness with hypotension: No Has patient had a PCN reaction causing severe rash involving mucus membranes or skin necrosis: No Has patient had a PCN reaction that required hospitalization: No Has patient had a PCN reaction occurring within the last 10 years: No If all of the above answers are "NO", then may proceed with Cephalosporin use.   [name] Shellfish Allergy Hives    MEDICATIONS: Current Outpatient Medications on File Prior to Visit  Medication Sig Dispense Refill  . acetaminophen (TYLENOL) 500 MG tablet Take 1,000 mg by mouth every 6 (six) hours as needed (Pain).     [name] aspirin 81 MG tablet Take 81  mg by mouth daily.    . diclofenac sodium (VOLTAREN) 1 % GEL Apply 2 g topically 4 (four) times daily. Rub into affected area of foot 2 to 4 times daily 100 g 2  . ezetimibe (ZETIA) 10 MG tablet Take 1 tablet (10 mg total) by mouth daily. 90 tablet 3  . metoprolol tartrate (LOPRESSOR) 25 MG tablet Take 0.5 tablets (12.5 mg total) by mouth 2 (two) times daily. 90 tablet 3  . nitroGLYCERIN (NITROSTAT) 0.4 MG SL tablet Place 1 tablet (0.4 mg  total) under the tongue every 5 (five) minutes as needed for chest pain. 25 tablet 11  . pantoprazole (PROTONIX) 40 MG tablet Take 1 tablet (40 mg total) by mouth daily. 30 tablet 3  . ramipril (ALTACE) 10 MG capsule Take 1 capsule (10 mg total) by mouth daily. 90 capsule 3  . rosuvastatin (CRESTOR) 40 MG tablet Take 1 tablet (40 mg total) by mouth daily. 90 tablet 3  . Vitamin D, Ergocalciferol, (DRISDOL) 1.25 MG (50000 UT) CAPS capsule Take 1 capsule (50,000 Units total) by mouth every 14 (fourteen) days. 2 capsule 0   No current facility-administered medications on file prior to visit.     PAST MEDICAL HISTORY: Past Medical History:  Diagnosis Date  . Aortic valve sclerosis    echo 35/5974, soft systolic murmur  . 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 Back pain   . Carotid artery disease ([place])    40-59% bilateral, doppler December 2010  . Constipation   . Coronary artery disease    a. BMS-mid RCA 11/2007 b. stress echo 02/2009 subtle inferior HK, lateral ST depressions with stress c. follow-up cath 02/2009: RCA stent patent, severe but stable stenosis of distal posterior lateral branch RCA. LV normal, med tx unless more sx c. ETT Myoview 08/27/12: negative for ischemia; EF 64%  . Decreased hearing   . Depression    Mild, August, 2012  . Dyslipidemia   . Easy bruising   . Ejection fraction    EF normal, catheterization, 2011 /  EF 55%, echo, 2010  . Fluid overload    Mild, stable since hospitalization in the past  . GERD (gastroesophageal reflux disease)    occarionally, takes Pepcid over the counter  . Heart murmur    has, occasional PVC's  . History of right hip replacement   . Hyperlipidemia   . Hypertension   . Joint pain   . Myocardial infarction ([place])    2009  . Palpitations   . PVC (premature ventricular contraction)    per prior Holter monitor  . Right hip pain 12/2009  . Shellfish allergy   . Trouble in sleeping   . Vitamin B12 deficiency   . Vitamin D deficiency      PAST SURGICAL HISTORY: Past Surgical History:  Procedure Laterality Date  . 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/01/2005  . carcinoid tumor removal    . CARDIAC CATHETERIZATION  1/211   Patent RCA stent, stenotic PLB supplying small distribution; medically managed  . CORONARY ANGIOPLASTY WITH STENT PLACEMENT  11/2007   BMS-mid RCA  . DIAGNOSTIC LAPAROSCOPY     1982 for endometriosis  . DILATION AND CURETTAGE OF UTERUS  2005  . hx of wisdom teeth surgery    . SKIN CANCER EXCISION    . TOTAL HIP ARTHROPLASTY Right 07/08/2014   Procedure: RIGHT TOTAL HIP ARTHROPLASTY ANTERIOR APPROACH;  Surgeon: Mcarthur Rossetti, MD;  Location: [facility];  Service: Orthopedics;  Laterality: Right;  SOCIAL HISTORY: Social History   Tobacco Use  . Smoking status: Never Smoker  . Smokeless tobacco: Never Used  Substance Use Topics  . Alcohol use: Yes    Alcohol/week: 7.0 standard drinks    Types: 7 Glasses of wine per week    Comment: 1 glass of wine/night  . Drug use: No    FAMILY HISTORY: Family History  Problem Relation Age of Onset  . Heart attack Father 2  . Hyperlipidemia Father   . Hypertension Father   . Sudden death Father   . Stroke Mother   . Hyperlipidemia Mother   . Hypertension Mother   . Heart disease Mother   . Obesity Mother   . Heart attack Paternal Grandfather   . Heart disease Sister   . BRCA 1/2 Neg Hx   . Breast cancer Neg Hx     ROS: Review of Systems  Constitutional: Negative for weight loss.  Cardiovascular: Negative for chest pain.  Neurological: Negative for dizziness and headaches.    PHYSICAL EXAM: Blood pressure (!) 117/56, pulse (!) 50, temperature (!) 97.4 F (36.3 C), temperature source Oral, height 5' (1.524 m), weight 143 lb (64.9 kg), SpO2 100 %. Body mass index is 27.93 kg/m. Physical Exam Vitals signs reviewed.  Constitutional:      Appearance: Normal appearance. She is obese.  Cardiovascular:     Rate and Rhythm: Normal rate.     Pulses:  Normal pulses.  Pulmonary:     Effort: Pulmonary effort is normal.     Breath sounds: Normal breath sounds.  Musculoskeletal: Normal range of motion.  Skin:    General: Skin is warm and dry.  Neurological:     Mental Status: She is alert and oriented to person, place, and time.  Psychiatric:        Mood and Affect: Mood normal.        Behavior: Behavior normal.     RECENT LABS AND TESTS: BMET    Component Value Date/Time   NA 141 09/14/2018 1217   K 4.3 09/14/2018 1217   CL 103 09/14/2018 1217   CO2 24 09/14/2018 1217   GLUCOSE 99 09/14/2018 1217   GLUCOSE 92 10/14/2017 1923   BUN 20 09/14/2018 1217   CREATININE 0.74 09/14/2018 1217   CALCIUM 9.6 09/14/2018 1217   GFRNONAA 83 09/14/2018 1217   GFRAA 96 09/14/2018 1217   Lab Results  Component Value Date   HGBA1C 5.4 09/14/2018   HGBA1C 5.6 03/03/2018   HGBA1C 5.6 10/15/2017   HGBA1C 5.6 06/16/2017   Lab Results  Component Value Date   INSULIN 4.2 09/14/2018   INSULIN 5.0 03/03/2018   INSULIN 5.9 10/15/2017   INSULIN 6.4 06/16/2017   CBC    Component Value Date/Time   WBC 7.4 03/03/2018 1116   WBC 12.2 (H) 10/14/2017 1923   RBC 4.20 03/03/2018 1116   RBC 4.21 10/14/2017 1923   HGB 13.2 03/03/2018 1116   HCT 39.3 03/03/2018 1116   PLT 235 10/14/2017 1923   MCV 94 03/03/2018 1116   MCH 31.4 03/03/2018 1116   MCH 31.8 10/14/2017 1923   MCHC 33.6 03/03/2018 1116   MCHC 33.8 10/14/2017 1923   RDW 11.9 03/03/2018 1116   LYMPHSABS 3.0 03/03/2018 1116   MONOABS 0.8 10/14/2017 1923   EOSABS 0.4 03/03/2018 1116   BASOSABS 0.1 03/03/2018 1116   Iron/TIBC/Ferritin/ %Sat    Component Value Date/Time   IRON 62 01/09/2015 0806   FERRITIN 26.6 01/09/2015 0806  IRONPCTSAT 15.2 (L) 01/09/2015 0806   Lipid Panel     Component Value Date/Time   CHOL 156 09/14/2018 1217   TRIG 85 09/14/2018 1217   HDL 74 09/14/2018 1217   CHOLHDL 2.5 10/29/2016 1030   CHOLHDL 3 11/30/2015 1028   VLDL 25.4 11/30/2015 1028    LDLCALC 65 09/14/2018 1217   Hepatic Function Panel     Component Value Date/Time   PROT 6.4 09/14/2018 1217   ALBUMIN 4.6 09/14/2018 1217   AST 23 09/14/2018 1217   ALT 20 09/14/2018 1217   ALKPHOS 66 09/14/2018 1217   BILITOT 0.3 09/14/2018 1217   BILIDIR 0.07 10/29/2016 1030      Component Value Date/Time   TSH 0.931 06/16/2017 0000   TSH 1.43 01/09/2015 0806   TSH 0.876 08/26/2012 1722      OBESITY BEHAVIORAL INTERVENTION VISIT  Today's visit was # 22   Starting weight: 157 lbs Starting date: 06/16/17 Today's weight : 143 lbs Today's date: 10/20/2018 Total lbs lost to date: 14    ASK: We discussed the diagnosis of obesity with [name] today and Valeen agreed to give [place] permission to discuss obesity behavioral modification therapy today.  ASSESS: Briar has the diagnosis of obesity and her BMI today is 51.93 Alainna is in the action stage of change   ADVISE: Berdena was educated on the multiple health risks of obesity as well as the benefit of weight loss to improve her health. She was advised of the need for long term treatment and the importance of lifestyle modifications to improve her current health and to decrease her risk of future health problems.  AGREE: Multiple dietary modification options and treatment options were discussed and  Keani agreed to follow the recommendations documented in the above note.  ARRANGE: Antonetta was educated on the importance of frequent visits to treat obesity as outlined per CMS and USPSTF guidelines and agreed to schedule her next follow up appointment today.  I, Trixie Dredge, am acting as transcriptionist for Dennard Nip, MD  I have reviewed the above documentation for accuracy and completeness, and I agree with the above. -Dennard Nip, MD

## 2018-10-20 NOTE — Patient Instructions (Signed)
Xray downstairs Call us when you need [place] [phone]

## 2018-10-20 NOTE — Assessment & Plan Note (Signed)
Patient is continued to make progress but still has some pain on a regular basis.  We discussed with patient in great length about icing regimen, home exercises, which activities of doing which wants to avoid.  Patient will increase activity slowly.  Follow-up with me again in 12 weeks if she would like to consider the possibility of PRP.  Patient did not want advanced imaging but did elect to do another x-ray.

## 2018-10-20 NOTE — Progress Notes (Signed)
[name] [address] [place], [place] [address] Phone: [phone] Subjective:   I Courtney [name] am serving as a Education administrator for Dr. Hulan Saas. I'm seeing this patient by the request  of:    CC: Right thumb follow-up  [id number]   09/02/2018 .  Flexor tendon is intact.  When isolating against active range of motion also gave feel the flexion occurring.  Ultrasound does show the avulsion still noted though.  I encouraged her to continue the vitamin D supplementation.  Start occupational therapy to see how patient responds.  Follow-up again in 6 weeks.  10/20/2018 Courtney [name] is a 68 y.o. female coming in with complaint of right thumb pain. Patient states that she still has pain with flexion but overall she is doing better. PT once a week.  Patient states that she is feeling approximately 40 to 50% better.  Patient states that time to time has some mild discomfort and pain.  Patient states that certain things is opening that I can can be difficult.      Past Medical History:  Diagnosis Date  . Aortic valve sclerosis    echo 38/1829, soft systolic murmur  . 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 Back pain   . Carotid artery disease ([place])    40-59% bilateral, doppler December 2010  . Constipation   . Coronary artery disease    a. BMS-mid RCA 11/2007 b. stress echo 02/2009 subtle inferior HK, lateral ST depressions with stress c. follow-up cath 02/2009: RCA stent patent, severe but stable stenosis of distal posterior lateral branch RCA. LV normal, med tx unless more sx c. ETT Myoview 08/27/12: negative for ischemia; EF 64%  . Decreased hearing   . Depression    Mild, August, 2012  . Dyslipidemia   . Easy bruising   . Ejection fraction    EF normal, catheterization, 2011 /  EF 55%, echo, 2010  . Fluid overload    Mild, stable since hospitalization in the past  . GERD (gastroesophageal reflux disease)    occarionally, takes Pepcid over the counter  . Heart  murmur    has, occasional PVC's  . History of right hip replacement   . Hyperlipidemia   . Hypertension   . Joint pain   . Myocardial infarction ([place])    2009  . Palpitations   . PVC (premature ventricular contraction)    per prior Holter monitor  . Right hip pain 12/2009  . Shellfish allergy   . Trouble in sleeping   . Vitamin B12 deficiency   . Vitamin D deficiency    Past Surgical History:  Procedure Laterality Date  . 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/01/2005  . carcinoid tumor removal    . CARDIAC CATHETERIZATION  1/211   Patent RCA stent, stenotic PLB supplying small distribution; medically managed  . CORONARY ANGIOPLASTY WITH STENT PLACEMENT  11/2007   BMS-mid RCA  . DIAGNOSTIC LAPAROSCOPY     1982 for endometriosis  . DILATION AND CURETTAGE OF UTERUS  2005  . hx of wisdom teeth surgery    . SKIN CANCER EXCISION    . TOTAL HIP ARTHROPLASTY Right 07/08/2014   Procedure: RIGHT TOTAL HIP ARTHROPLASTY ANTERIOR APPROACH;  Surgeon: Mcarthur Rossetti, MD;  Location: [facility];  Service: Orthopedics;  Laterality: Right;   Social History   Socioeconomic History  . Marital status: Divorced    Spouse name: Not on file  . Number of children: 1  .  Years of education: 69  . Highest education level: Not on file  Occupational History  . Occupation: Retired - Retail buyer  Social Needs  . Financial resource strain: Not hard at all  . Food insecurity    Worry: Never true    Inability: Never true  . Transportation needs    Medical: No    Non-medical: No  Tobacco Use  . Smoking status: Never Smoker  . Smokeless tobacco: Never Used  Substance and Sexual Activity  . Alcohol use: Yes    Alcohol/week: 7.0 standard drinks    Types: 7 Glasses of wine per week    Comment: 1 glass of wine/night  . Drug use: No  . Sexual activity: Yes  Lifestyle  . Physical activity    Days per week: 3 days    Minutes per session: 30 min  . Stress: Not at all  Relationships  . Social connections     Talks on phone: More than three times a week    Gets together: More than three times a week    Attends religious service: Not on file    Active member of club or organization: Not on file    Attends meetings of clubs or organizations: Not on file    Relationship status: Living with partner  Other Topics Concern  . Not on file  Social History Narrative   Fun: Dance, garden,    Denies religious beliefs effecting health care.   Denies abuse and feels safe at home   Allergies  Allergen Reactions  . Erythromycin Hives  . Penicillins Hives    Has patient had a PCN reaction causing immediate rash, facial/tongue/throat swelling, SOB or lightheadedness with hypotension: No Has patient had a PCN reaction causing severe rash involving mucus membranes or skin necrosis: No Has patient had a PCN reaction that required hospitalization: No Has patient had a PCN reaction occurring within the last 10 years: No If all of the above answers are "NO", then may proceed with Cephalosporin use.   . Shellfish Allergy Hives   Family History  Problem Relation Age of Onset  . Heart attack Father 86  . Hyperlipidemia Father   . Hypertension Father   . Sudden death Father   . Stroke Mother   . Hyperlipidemia Mother   . Hypertension Mother   . Heart disease Mother   . Obesity Mother   . Heart attack Paternal Grandfather   . Heart disease Sister   . BRCA 1/2 Neg Hx   . Breast cancer Neg Hx      Current Outpatient Medications (Cardiovascular):  .  ezetimibe (ZETIA) 10 MG tablet, Take 1 tablet (10 mg total) by mouth daily. .  metoprolol tartrate (LOPRESSOR) 25 MG tablet, Take 0.5 tablets (12.5 mg total) by mouth 2 (two) times daily. .  nitroGLYCERIN (NITROSTAT) 0.4 MG SL tablet, Place 1 tablet (0.4 mg total) under the tongue every 5 (five) minutes as needed for chest pain. .  ramipril (ALTACE) 10 MG capsule, Take 1 capsule (10 mg total) by mouth daily. .  rosuvastatin (CRESTOR) 40 MG tablet, Take 1  tablet (40 mg total) by mouth daily.   Current Outpatient Medications (Analgesics):  .  acetaminophen (TYLENOL) 500 MG tablet, Take 1,000 mg by mouth every 6 (six) hours as needed (Pain).  [name]  aspirin 81 MG tablet, Take 81 mg by mouth daily.   Current Outpatient Medications (Other):  .  diclofenac sodium (VOLTAREN) 1 % GEL, Apply 2 g topically 4 (  four) times daily. Rub into affected area of foot 2 to 4 times daily .  pantoprazole (PROTONIX) 40 MG tablet, Take 1 tablet (40 mg total) by mouth daily. .  Vitamin D, Ergocalciferol, (DRISDOL) 1.25 MG (50000 UT) CAPS capsule, Take 1 capsule (50,000 Units total) by mouth every 14 (fourteen) days.    Past medical history, social, surgical and family history all reviewed in electronic medical record.  No pertanent information unless stated regarding to the chief complaint.   Review of Systems:  No headache, visual changes, nausea, vomiting, diarrhea, constipation, dizziness, abdominal pain, skin rash, fevers, chills, night sweats, weight loss, swollen lymph nodes, body aches, joint swelling, chest pain, shortness of breath, mood changes.  Positive muscle aches  Objective  Blood pressure (!) 144/70, pulse (!) 52, height 5' (1.524 m), weight 146 lb (66.2 kg), SpO2 97 %.    General: No apparent distress alert and oriented x3 mood and affect normal, dressed appropriately.  HEENT: Pupils equal, extraocular movements intact  Respiratory: Patient's speak in full sentences and does not appear short of breath  Cardiovascular: No lower extremity edema, non tender, no erythema  Skin: Warm dry intact with no signs of infection or rash on extremities or on axial skeleton.  Abdomen: Soft nontender  Neuro: Cranial nerves II through XII are intact, neurovascularly intact in all extremities with 2+ DTRs and 2+ pulses.  Lymph: No lymphadenopathy of posterior or anterior cervical chain or axillae bilaterally.  Gait normal with good balance and coordination.  MSK:   Non tender with full range of motion and good stability and symmetric strength and tone of shoulders, elbows, wrist, hip, knee and ankles bilaterally.  Right hand exam still shows the patient does have decreased strength with flexion of the IP joint of the right thumb.  If counterstrain given patient is able to flex it though.  Mild discomfort over the [facility].  Mild positive grind of the [facility] neurovascular intact distally. Limited musculoskeletal ultrasound was performed and interpreted by Lyndal Pulley  Limited ultrasound shows the patient does have what appears to still be a very small avulsion noted on the volar plate.  Mild healing noted.  Patient doing flexor tendon does appear to be intact.  Mild to moderate arthritic changes of the [facility] joint noted.   Impression and Recommendations:      The above documentation has been reviewed and is accurate and complete Lyndal Pulley, DO       Note: This dictation was prepared with Dragon dictation along with smaller phrase technology. Any transcriptional errors that result from this process are unintentional.

## 2018-10-21 ENCOUNTER — Ambulatory Visit (HOSPITAL_COMMUNITY): Payer: Medicare HMO | Attending: Family Medicine | Admitting: Specialist

## 2018-10-21 ENCOUNTER — Encounter (HOSPITAL_COMMUNITY): Payer: Self-pay | Admitting: Specialist

## 2018-10-21 DIAGNOSIS — R29898 Other symptoms and signs involving the musculoskeletal system: Secondary | ICD-10-CM | POA: Diagnosis not present

## 2018-10-21 DIAGNOSIS — M25541 Pain in joints of right hand: Secondary | ICD-10-CM | POA: Insufficient documentation

## 2018-10-21 NOTE — Patient Instructions (Signed)
  Coordination Activities  Perform the following activities for 2-3 times daily   Rotate ball in fingertips (clockwise and counter-clockwise).  Flip cards 1 at a time as fast as you can.  Deal cards with your thumb (Hold deck in hand and push card off top with thumb).  Rotate card in hand (clockwise and counter-clockwise).  Shuffle cards.  Pick up coins, buttons, marbles, dried beans/pasta of different sizes and place in container.  Pick up coins and place in container or coin bank.  Pick up coins and stack.  Pick up coins one at a time until you get 5-10 in your hand, then move coins from palm to fingertips to stack one at a time.  Twirl pen between fingers.  Practice writing and/or typing.  Screw together nuts and bolts, then unfasten.     Block thumb and bend the top joint against your other hand, complete 5 times, 2-3 times per day. DIP Flexion (Active Blocked)    Hold __thumb____ firmly at the middle so that only the tip joint can bend. Hold __10__ seconds. Repeat __10__ times. Do ___2_ sessions per day.  Copyright  VHI. All rights reserved.     When picking items up, attempt to avoid hyperextending your thumb joint, instead attempt to have a soft bend in your thumb.

## 2018-10-22 NOTE — Therapy (Signed)
[place], [place], [address] Phone: [phone]   Fax:  [phone]  Occupational Therapy Treatment  Patient Details  Name: Courtney [name] MRN: [id number] Date of Birth: [birth year] Referring Provider (OT): Dr. Hulan Saas   Encounter Date: 10/21/2018  OT End of Session - 10/21/18 1203    Visit Number  4    Date for OT Re-Evaluation  11/18/18    Authorization Type  [employer], $40 copay    Authorization Time Period  no visit limit    OT Start Time  0950    OT Stop Time  1020    OT Time Calculation (min)  30 min    Activity Tolerance  Patient tolerated treatment well    Behavior During Therapy  [facility] for tasks assessed/performed       Past Medical History:  Diagnosis Date  . Aortic valve sclerosis    echo 123456, soft systolic murmur  . 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 Back pain   . Carotid artery disease ([place])    40-59% bilateral, doppler December 2010  . Constipation   . Coronary artery disease    a. BMS-mid RCA 11/2007 b. stress echo 02/2009 subtle inferior HK, lateral ST depressions with stress c. follow-up cath 02/2009: RCA stent patent, severe but stable stenosis of distal posterior lateral branch RCA. LV normal, med tx unless more sx c. ETT Myoview 08/27/12: negative for ischemia; EF 64%  . Decreased hearing   . Depression    Mild, August, 2012  . Dyslipidemia   . Easy bruising   . Ejection fraction    EF normal, catheterization, 2011 /  EF 55%, echo, 2010  . Fluid overload    Mild, stable since hospitalization in the past  . GERD (gastroesophageal reflux disease)    occarionally, takes Pepcid over the counter  . Heart murmur    has, occasional PVC's  . History of right hip replacement   . Hyperlipidemia   . Hypertension   . Joint pain   . Myocardial infarction ([place])    2009  . Palpitations   . PVC (premature ventricular contraction)    per prior Holter monitor  . Right hip pain 12/2009   . Shellfish allergy   . Trouble in sleeping   . Vitamin B12 deficiency   . Vitamin D deficiency     Past Surgical History:  Procedure Laterality Date  . 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/01/2005  . carcinoid tumor removal    . CARDIAC CATHETERIZATION  1/211   Patent RCA stent, stenotic PLB supplying small distribution; medically managed  . CORONARY ANGIOPLASTY WITH STENT PLACEMENT  11/2007   BMS-mid RCA  . DIAGNOSTIC LAPAROSCOPY     1982 for endometriosis  . DILATION AND CURETTAGE OF UTERUS  2005  . hx of wisdom teeth surgery    . SKIN CANCER EXCISION    . TOTAL HIP ARTHROPLASTY Right 07/08/2014   Procedure: RIGHT TOTAL HIP ARTHROPLASTY ANTERIOR APPROACH;  Surgeon: Mcarthur Rossetti, MD;  Location: [facility];  Service: Orthopedics;  Laterality: Right;    There were no vitals filed for this visit.  Subjective Assessment - 10/21/18 1150    Subjective   S:  I saw the MD yesterday, and he had hoped it would be doing better than I am.  It doesnt bother me most of the time, but it does continue to pop when I use it to forcefully.  10/21/18 0001  Assessment  Medical Diagnosis closed avulsion fx of right thumb  Referring Provider (OT) Dr. Hulan Saas  Precautions  Precautions None  Restrictions  Weight Bearing Restrictions No  ADL  ADL comments improved independence with day to day activities and less pain.  patient reports painful popping when using her thumb and fingers to squeeze items with force.  thumb hyperextends versus flexing with most pinching activities  Palpation  Palpation comment No restrictions palpated  Right Hand AROM  R Thumb MCP 0-60 70 Degrees (60)  R Thumb IP 0-80 21 Degrees (15)  Right Hand PROM  R Thumb MCP 0-60 78 Degrees (70)  R Thumb IP 0-80 51 Degrees (30)  Hand Function  Right Hand Grip (lbs) 40 (30)  Right Hand Lateral Pinch 10 lbs (8)  Right Hand 3 Point Pinch 9 lbs (5)  Left Hand Grip (lbs) 40  Left Hand Lateral Pinch 13 lbs   Left 3 point pinch 8 lbs           OT Treatments/Exercises (OP) - 10/22/18 0001      Neurological Re-education Exercises   Theraputty - Flatten  green in standing completed PVC pipe pulls for wrist stability and improved IPJ flexion     Theraputty - Roll  green    Theraputty - Grip  green supinated and pronated grasp    Theraputty - Pinch  lateral pinch, thumb pull to promote ip flexion 5 times each     Theraputty Hand- Locate Pegs  10 beads using thumb and index finger to locate beads       Fine Motor Coordination (Hand/Wrist)   Grooved pegs  placed pegs in pegboard using pincer grasp removed 5 at a time to promote IPJ flexion for translation from pincer grasp to palm.               OT Education - 10/21/18 1151    Education Details  incerased HEP to mod resist green theraputty, joint blocking, fine motor coordination    Person(s) Educated  Patient    Methods  Explanation;Demonstration;Handout    Comprehension  Verbalized understanding;Returned demonstration       OT Short Term Goals - 10/21/18 1204      OT SHORT TERM GOAL #1   Title  Pt will be provided with and educated on HEP for improved use of right hand as dominant during ADL tasks.    Time  4    Period  Weeks    Status  On-going    Target Date  10/29/18      OT SHORT TERM GOAL #2   Title  Pt will decrease pain in right hand to 3/10 or less to improve ability to perform gardening tasks.    Time  4    Period  Weeks    Status  On-going      OT SHORT TERM GOAL #3   Title  Pt will increase right thumb IP ROM to [facility] to improve ability to operate scissors during daily and leisure tasks.    Time  4    Period  Weeks    Status  On-going      OT SHORT TERM GOAL #4   Title  Pt will increase right grip strength by 10# and pinch strength by 3# to improve ability to open jars and lids.    Time  4    Period  Weeks    Status  Achieved  Plan - 10/21/18 1159    Clinical Impression  Statement  A:  patient has made significant improvements in a/rom, p/rom, and strength.  However, her a/rom in her right thumb IPJ lacks 30 degrees compared to her non injured left thumb.  Patient continues to experience pain and popping sensation in her IPJ when she uses force to pinch an item.  Patient would benefit from continued skilled OT intervention 1 time every other week for 4 weeks to continue to improve a/rom in IPJ and decrease pain with purposeful pinching.    Body Structure / Function / Physical Skills  ADL;UE functional use;Pain;ROM;IADL;Strength    OT Frequency  Biweekly    OT Duration  4 weeks    OT Treatment/Interventions  Self-care/ADL training;Ultrasound;Patient/family education;Passive range of motion;Cryotherapy;Electrical Stimulation;Moist Heat;Therapeutic exercise;Manual Therapy;Therapeutic activities    Plan  P:  Reassess for improved a/rom of IPJ, grip and pinch strength and decreased pain and upgrade HEP as needed.       Patient will benefit from skilled therapeutic intervention in order to improve the following deficits and impairments:   Body Structure / Function / Physical Skills: ADL, UE functional use, Pain, ROM, IADL, Strength       Visit Diagnosis: Other symptoms and signs involving the musculoskeletal system  Pain in joint of right hand    Problem List Patient Active Problem List   Diagnosis Date Noted  . Vitamin D deficiency 08/04/2018  . Avulsion fracture of phalanx of right thumb 07/09/2018  . Essential hypertension 11/17/2017  . Elevated sed rate 10/15/2017  . Sphenoid sinusitis 10/15/2017  . Obesity 07/15/2017  . Insomnia 07/15/2017  . Female stress incontinence 07/15/2017  . Anxiety 07/15/2017  . Anal fissure 02/25/2017  . Gastroesophageal reflux disease with esophagitis 02/25/2017  . Vertigo 01/05/2016  . Medicare annual wellness visit, subsequent 11/29/2015  . Osteopenia 06/27/2015  . Routine general medical examination at a health  care facility 01/06/2015  . Osteoarthritis of right hip 07/08/2014  . Sinus bradycardia 08/27/2012  . Depression   . Carotid artery disease ([place])   . PVC (premature ventricular contraction)   . Aortic valve sclerosis   . Mixed hyperlipidemia   . Coronary artery disease   . CAD (coronary artery disease)     Vangie Bicker, [place], OTR/L [phone]  10/22/2018, 10:09 AM  [place], [place], [address] Phone: [phone]   Fax:  [phone]  Name: Courtney [name] MRN: [id number] Date of Birth: September 21, 1950

## 2018-10-27 ENCOUNTER — Ambulatory Visit (HOSPITAL_COMMUNITY)
Admission: RE | Admit: 2018-10-27 | Payer: Medicare HMO | Source: Ambulatory Visit | Attending: Cardiology | Admitting: Cardiology

## 2018-11-02 ENCOUNTER — Other Ambulatory Visit: Payer: Self-pay

## 2018-11-02 ENCOUNTER — Ambulatory Visit (HOSPITAL_COMMUNITY)
Admission: RE | Admit: 2018-11-02 | Discharge: 2018-11-02 | Disposition: A | Discharge status: Home / Self Care | Payer: Medicare HMO | Source: Ambulatory Visit | Attending: Cardiology | Admitting: Cardiology

## 2018-11-02 DIAGNOSIS — R0989 Other specified symptoms and signs involving the circulatory and respiratory systems: Secondary | ICD-10-CM | POA: Insufficient documentation

## 2018-11-02 DIAGNOSIS — I10 Essential (primary) hypertension: Secondary | ICD-10-CM

## 2018-11-02 DIAGNOSIS — E7800 Pure hypercholesterolemia, unspecified: Secondary | ICD-10-CM | POA: Diagnosis not present

## 2018-11-02 DIAGNOSIS — Z20822 Contact with and (suspected) exposure to covid-19: Secondary | ICD-10-CM

## 2018-11-02 DIAGNOSIS — I2510 Atherosclerotic heart disease of native coronary artery without angina pectoris: Secondary | ICD-10-CM

## 2018-11-02 DIAGNOSIS — R6889 Other general symptoms and signs: Secondary | ICD-10-CM | POA: Diagnosis not present

## 2018-11-03 ENCOUNTER — Ambulatory Visit: Payer: Medicare HMO | Admitting: Cardiology

## 2018-11-03 ENCOUNTER — Ambulatory Visit (HOSPITAL_COMMUNITY): Payer: Medicare HMO | Admitting: Specialist

## 2018-11-03 ENCOUNTER — Encounter (HOSPITAL_COMMUNITY): Payer: Self-pay | Admitting: Specialist

## 2018-11-03 DIAGNOSIS — R29898 Other symptoms and signs involving the musculoskeletal system: Secondary | ICD-10-CM | POA: Diagnosis not present

## 2018-11-03 DIAGNOSIS — M25541 Pain in joints of right hand: Secondary | ICD-10-CM | POA: Diagnosis not present

## 2018-11-03 LAB — NOVEL CORONAVIRUS, NAA: SARS-CoV-2, NAA: NOT DETECTED

## 2018-11-03 NOTE — Therapy (Signed)
[place], [place], [address] Phone: [phone]   Fax:  [phone]  Occupational Therapy Treatment  Patient Details  Name: Courtney [name] MRN: [id number] Date of Birth: Mar 31, 1950 Referring Provider (OT): Dr. Hulan Saas   Encounter Date: 11/03/2018  OT End of Session - 11/03/18 1325    Visit Number  5    Number of Visits  8    Date for OT Re-Evaluation  11/18/18    Authorization Type  [employer], $40 copay    Authorization Time Period  no visit limit    OT Start Time  1300    OT Stop Time  1320    OT Time Calculation (min)  20 min    Activity Tolerance  Patient tolerated treatment well    Behavior During Therapy  [facility] for tasks assessed/performed       Past Medical History:  Diagnosis Date  . Aortic valve sclerosis    echo 46/6599, soft systolic murmur  . 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 Back pain   . Carotid artery disease ([place])    40-59% bilateral, doppler December 2010  . Constipation   . Coronary artery disease    a. BMS-mid RCA 11/2007 b. stress echo 02/2009 subtle inferior HK, lateral ST depressions with stress c. follow-up cath 02/2009: RCA stent patent, severe but stable stenosis of distal posterior lateral branch RCA. LV normal, med tx unless more sx c. ETT Myoview 08/27/12: negative for ischemia; EF 64%  . Decreased hearing   . Depression    Mild, August, 2012  . Dyslipidemia   . Easy bruising   . Ejection fraction    EF normal, catheterization, 2011 /  EF 55%, echo, 2010  . Fluid overload    Mild, stable since hospitalization in the past  . GERD (gastroesophageal reflux disease)    occarionally, takes Pepcid over the counter  . Heart murmur    has, occasional PVC's  . History of right hip replacement   . Hyperlipidemia   . Hypertension   . Joint pain   . Myocardial infarction ([place])    2009  . Palpitations   . PVC (premature ventricular contraction)    per prior Holter monitor   . Right hip pain 12/2009  . Shellfish allergy   . Trouble in sleeping   . Vitamin B12 deficiency   . Vitamin D deficiency     Past Surgical History:  Procedure Laterality Date  . 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/01/2005  . carcinoid tumor removal    . CARDIAC CATHETERIZATION  1/211   Patent RCA stent, stenotic PLB supplying small distribution; medically managed  . CORONARY ANGIOPLASTY WITH STENT PLACEMENT  11/2007   BMS-mid RCA  . DIAGNOSTIC LAPAROSCOPY     1982 for endometriosis  . DILATION AND CURETTAGE OF UTERUS  2005  . hx of wisdom teeth surgery    . SKIN CANCER EXCISION    . TOTAL HIP ARTHROPLASTY Right 07/08/2014   Procedure: RIGHT TOTAL HIP ARTHROPLASTY ANTERIOR APPROACH;  Surgeon: Mcarthur Rossetti, MD;  Location: [facility];  Service: Orthopedics;  Laterality: Right;    There were no vitals filed for this visit.  Subjective Assessment - 11/03/18 1323    Subjective   S:  It is much better.  I can really bend the joint quite well now.  It still hurts to use scissors or clippers, however, I can do it.    Currently in Pain?  No/denies    Pain Score  0-No pain         [facility] OT Assessment - 11/03/18 0001      Assessment   Medical Diagnosis  closed avulsion fx of right thumb    Referring Provider (OT)  Dr. Hulan Saas      Precautions   Precautions  None      Restrictions   Weight Bearing Restrictions  No      ADL   ADL comments  patient reports increased independence with all functional activities with less pain.  Patient has continued difficulty with using gardening scissors and snippers       Palpation   Palpation comment  No restrictions palpated      Right Hand AROM   R Thumb MCP 0-60  72 Degrees   70   R Thumb IP 0-80  42 Degrees   21     Right Hand PROM   R Thumb MCP 0-60  80 Degrees   78   R Thumb IP 0-80  61 Degrees   51     Hand Function   Right Hand Grip (lbs)  40   40   Right Hand Lateral Pinch  10 lbs   10   Right Hand 3 Point Pinch  10  lbs   9              OT Treatments/Exercises (OP) - 11/03/18 0001      ADLs   ADL Comments  discussed use of gardening tools and household tools and utensils that have comfort grips or larger handles.  Suggested [employer] good grips brand and demonstrated good grips kitchen shears with spring load handles.  Patient receptive to use of these items              OT Education - 11/03/18 1324    Education Details  reviewed established HEP, patient to continue independently.  suggested [employer] brand good grips scissors for use in garden and in home.  Patient stated that she was going to look into these on [place].    Person(s) Educated  Patient    Methods  Explanation;Demonstration    Comprehension  Verbalized understanding       OT Short Term Goals - 11/03/18 1327      OT SHORT TERM GOAL #1   Title  Pt will be provided with and educated on HEP for improved use of right hand as dominant during ADL tasks.    Time  4    Period  Weeks    Status  Achieved    Target Date  10/29/18      OT SHORT TERM GOAL #2   Title  Pt will decrease pain in right hand to 3/10 or less to improve ability to perform gardening tasks.    Time  4    Period  Weeks    Status  Achieved      OT SHORT TERM GOAL #3   Title  Pt will increase right thumb IP ROM to [facility] to improve ability to operate scissors during daily and leisure tasks.    Time  4    Period  Weeks    Status  Achieved      OT SHORT TERM GOAL #4   Title  Pt will increase right grip strength by 10# and pinch strength by 3# to improve ability to open jars and lids.    Time  4    Period  Weeks  Status  Achieved               Plan - 11/03/18 1326    Clinical Impression Statement  A:  Ms. Bright has met all OT goals on established plan of care.  Patient improved A/ROM of IPJ by 21 degrees from previous session.  Patient is satisfied with her current functional level and wishes to continue with HEP and be discharged from skilled OT  intervention.    Body Structure / Function / Physical Skills  ADL;UE functional use;Pain;ROM;IADL;Strength    Plan  P: DC from skilled OT intervention this date    Consulted and Agree with Plan of Care  Patient       Patient will benefit from skilled therapeutic intervention in order to improve the following deficits and impairments:   Body Structure / Function / Physical Skills: ADL, UE functional use, Pain, ROM, IADL, Strength       Visit Diagnosis: Other symptoms and signs involving the musculoskeletal system  Pain in joint of right hand    Problem List Patient Active Problem List   Diagnosis Date Noted  . Vitamin D deficiency 08/04/2018  . Avulsion fracture of phalanx of right thumb 07/09/2018  . Essential hypertension 11/17/2017  . Elevated sed rate 10/15/2017  . Sphenoid sinusitis 10/15/2017  . Obesity 07/15/2017  . Insomnia 07/15/2017  . Female stress incontinence 07/15/2017  . Anxiety 07/15/2017  . Anal fissure 02/25/2017  . Gastroesophageal reflux disease with esophagitis 02/25/2017  . Vertigo 01/05/2016  . Medicare annual wellness visit, subsequent 11/29/2015  . Osteopenia 06/27/2015  . Routine general medical examination at a health care facility 01/06/2015  . Osteoarthritis of right hip 07/08/2014  . Sinus bradycardia 08/27/2012  . Depression   . Carotid artery disease ([place])   . PVC (premature ventricular contraction)   . Aortic valve sclerosis   . Mixed hyperlipidemia   . Coronary artery disease   . CAD (coronary artery disease)     Vangie Bicker, MHA, OTR/L [phone]   OCCUPATIONAL THERAPY DISCHARGE SUMMARY  Visits from Start of Care: 5  Current functional level related to goals / functional outcomes: Increased functional use of right hand with improved flexion at IPJ with functional grasp and pinch   Remaining deficits: Pain with use of scissors and clippers    Education / Equipment: Educated on use of good grips [employer] tools with  comfort grips  Plan: Patient agrees to discharge.  Patient goals were met. Patient is being discharged due to meeting the stated rehab goals.  ?????         Vangie Bicker, [place], OTR/L [phone]   11/03/2018, 1:32 PM  [place] [address] [place], [place], [address] Phone: [phone]   Fax:  [phone]  Name: Courtney [name] MRN: [id number] Date of Birth: 08/07/1950

## 2018-11-09 ENCOUNTER — Encounter (INDEPENDENT_AMBULATORY_CARE_PROVIDER_SITE_OTHER): Payer: Self-pay | Admitting: Family Medicine

## 2018-11-09 ENCOUNTER — Ambulatory Visit (INDEPENDENT_AMBULATORY_CARE_PROVIDER_SITE_OTHER): Payer: Medicare HMO | Admitting: Family Medicine

## 2018-11-09 ENCOUNTER — Other Ambulatory Visit: Payer: Self-pay

## 2018-11-09 VITALS — BP 102/60 | HR 48 | Temp 98.1°F | Ht 60.0 in | Wt 143.0 lb

## 2018-11-09 DIAGNOSIS — E669 Obesity, unspecified: Secondary | ICD-10-CM | POA: Diagnosis not present

## 2018-11-09 DIAGNOSIS — E559 Vitamin D deficiency, unspecified: Secondary | ICD-10-CM | POA: Diagnosis not present

## 2018-11-09 DIAGNOSIS — Z6830 Body mass index (BMI) 30.0-30.9, adult: Secondary | ICD-10-CM

## 2018-11-09 MED ORDER — VITAMIN D (ERGOCALCIFEROL) 1.25 MG (50000 UNIT) PO CAPS: 50000.0000 [IU] | ORAL_CAPSULE | ORAL | 0 refills | Status: DC

## 2018-11-10 DIAGNOSIS — H903 Sensorineural hearing loss, bilateral: Secondary | ICD-10-CM | POA: Diagnosis not present

## 2018-11-10 DIAGNOSIS — H9313 Tinnitus, bilateral: Secondary | ICD-10-CM | POA: Diagnosis not present

## 2018-11-11 NOTE — Progress Notes (Signed)
Office: [phone]  /  Fax: [phone]   HPI:   Chief Complaint: OBESITY Courtney [name] is here to discuss her progress with her obesity treatment plan. She is on the Category 1 plan and is following her eating plan approximately 85 % of the time. She states she is walking for 30 minutes 2 times per week. Courtney [name] continues to do well maintaining her weight. She is having some morning hunger but is not eating all of her protein. She is working later hours and meal planning has been challenging.  Her weight is 143 lb (64.9 kg) today and has not lost weight since her last visit. She has lost 14 lbs since starting treatment with [place].  Vitamin D Deficiency Courtney [name] has a diagnosis of vitamin D deficiency. She is stable on prescription Vit D and denies nausea, vomiting or muscle weakness.  ASSESSMENT AND PLAN:  Vitamin D deficiency - Plan: Vitamin D, Ergocalciferol, (DRISDOL) 1.25 MG (50000 UT) CAPS capsule  Class 1 obesity with serious comorbidity and body mass index (BMI) of 30.0 to 30.9 in adult, unspecified obesity type - BMI greater than 30 at start of program   PLAN:  Vitamin D Deficiency Courtney [name] was informed that low vitamin D levels contributes to fatigue and are associated with obesity, breast, and colon cancer. Courtney [name] agrees to continue taking prescription Vit D 50,000 IU every 14 days #2 and we will refill for 1 month. She will follow up for routine testing of vitamin D, at least 2-3 times per year. She was informed of the risk of over-replacement of vitamin D and agrees to not increase her dose unless she discusses this with [place] first. We will recheck labs in 6 weeks. Courtney [name] agrees to follow up with our clinic in 3 to 4 weeks.  Obesity Courtney [name] is currently in the action stage of change. As such, her goal is to continue with weight loss efforts She has agreed to follow the Category 1 plan Courtney [name] has been instructed to work up to a goal of 150 minutes of combined cardio and strengthening exercise  per week for weight loss and overall health benefits. We discussed the following Behavioral Modification Strategies today: increasing lean protein intake, decreasing simple carbohydrates  and work on meal planning and easy cooking plans   Courtney [name] has agreed to follow up with our clinic in 3 to 4 weeks. She was informed of the importance of frequent follow up visits to maximize her success with intensive lifestyle modifications for her multiple health conditions.  ALLERGIES: Allergies  Allergen Reactions  . Erythromycin Hives  . Penicillins Hives    Has patient had a PCN reaction causing immediate rash, facial/tongue/throat swelling, SOB or lightheadedness with hypotension: No Has patient had a PCN reaction causing severe rash involving mucus membranes or skin necrosis: No Has patient had a PCN reaction that required hospitalization: No Has patient had a PCN reaction occurring within the last 10 years: No If all of the above answers are "NO", then may proceed with Cephalosporin use.   [name] Shellfish Allergy Hives    MEDICATIONS: Current Outpatient Medications on File Prior to Visit  Medication Sig Dispense Refill  . acetaminophen (TYLENOL) 500 MG tablet Take 1,000 mg by mouth every 6 (six) hours as needed (Pain).     [name] aspirin 81 MG tablet Take 81 mg by mouth daily.    . diclofenac sodium (VOLTAREN) 1 % GEL Apply 2 g topically 4 (four) times daily. Rub into affected area of foot 2 to  4 times daily 100 g 2  . ezetimibe (ZETIA) 10 MG tablet Take 1 tablet (10 mg total) by mouth daily. 90 tablet 3  . metoprolol tartrate (LOPRESSOR) 25 MG tablet Take 0.5 tablets (12.5 mg total) by mouth 2 (two) times daily. 90 tablet 3  . nitroGLYCERIN (NITROSTAT) 0.4 MG SL tablet Place 1 tablet (0.4 mg total) under the tongue every 5 (five) minutes as needed for chest pain. 25 tablet 11  . pantoprazole (PROTONIX) 40 MG tablet Take 1 tablet (40 mg total) by mouth daily. 30 tablet 3  . ramipril (ALTACE) 10 MG  capsule Take 1 capsule (10 mg total) by mouth daily. 90 capsule 3  . rosuvastatin (CRESTOR) 40 MG tablet Take 1 tablet (40 mg total) by mouth daily. 90 tablet 3   No current facility-administered medications on file prior to visit.     PAST MEDICAL HISTORY: Past Medical History:  Diagnosis Date  . Aortic valve sclerosis    echo 15/4008, soft systolic murmur  . 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 Back pain   . Carotid artery disease ([place])    40-59% bilateral, doppler December 2010  . Constipation   . Coronary artery disease    a. BMS-mid RCA 11/2007 b. stress echo 02/2009 subtle inferior HK, lateral ST depressions with stress c. follow-up cath 02/2009: RCA stent patent, severe but stable stenosis of distal posterior lateral branch RCA. LV normal, med tx unless more sx c. ETT Myoview 08/27/12: negative for ischemia; EF 64%  . Decreased hearing   . Depression    Mild, August, 2012  . Dyslipidemia   . Easy bruising   . Ejection fraction    EF normal, catheterization, 2011 /  EF 55%, echo, 2010  . Fluid overload    Mild, stable since hospitalization in the past  . GERD (gastroesophageal reflux disease)    occarionally, takes Pepcid over the counter  . Heart murmur    has, occasional PVC's  . History of right hip replacement   . Hyperlipidemia   . Hypertension   . Joint pain   . Myocardial infarction ([place])    2009  . Palpitations   . PVC (premature ventricular contraction)    per prior Holter monitor  . Right hip pain 12/2009  . Shellfish allergy   . Trouble in sleeping   . Vitamin B12 deficiency   . Vitamin D deficiency     PAST SURGICAL HISTORY: Past Surgical History:  Procedure Laterality Date  . 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/01/2005  . carcinoid tumor removal    . CARDIAC CATHETERIZATION  1/211   Patent RCA stent, stenotic PLB supplying small distribution; medically managed  . CORONARY ANGIOPLASTY WITH STENT PLACEMENT  11/2007   BMS-mid RCA  . DIAGNOSTIC LAPAROSCOPY     1982  for endometriosis  . DILATION AND CURETTAGE OF UTERUS  2005  . hx of wisdom teeth surgery    . SKIN CANCER EXCISION    . TOTAL HIP ARTHROPLASTY Right 07/08/2014   Procedure: RIGHT TOTAL HIP ARTHROPLASTY ANTERIOR APPROACH;  Surgeon: Mcarthur Rossetti, MD;  Location: [facility];  Service: Orthopedics;  Laterality: Right;    SOCIAL HISTORY: Social History   Tobacco Use  . Smoking status: Never Smoker  . Smokeless tobacco: Never Used  Substance Use Topics  . Alcohol use: Yes    Alcohol/week: 7.0 standard drinks    Types: 7 Glasses of wine per week    Comment: 1 glass of wine/night  .  Drug use: No    FAMILY HISTORY: Family History  Problem Relation Age of Onset  . Heart attack Father 80  . Hyperlipidemia Father   . Hypertension Father   . Sudden death Father   . Stroke Mother   . Hyperlipidemia Mother   . Hypertension Mother   . Heart disease Mother   . Obesity Mother   . Heart attack Paternal Grandfather   . Heart disease Sister   . BRCA 1/2 Neg Hx   . Breast cancer Neg Hx     ROS: Review of Systems  Constitutional: Negative for weight loss.  Gastrointestinal: Negative for nausea and vomiting.  Musculoskeletal:       Negative muscle weakness    PHYSICAL EXAM: Blood pressure 102/60, pulse (!) 48, temperature 98.1 F (36.7 C), temperature source Oral, height 5' (1.524 m), weight 143 lb (64.9 kg), SpO2 98 %. Body mass index is 27.93 kg/m. Physical Exam Vitals signs reviewed.  Constitutional:      Appearance: Normal appearance. She is obese.  Cardiovascular:     Rate and Rhythm: Normal rate.     Pulses: Normal pulses.  Pulmonary:     Effort: Pulmonary effort is normal.     Breath sounds: Normal breath sounds.  Musculoskeletal: Normal range of motion.  Skin:    General: Skin is warm and dry.  Neurological:     Mental Status: She is alert and oriented to person, place, and time.  Psychiatric:        Mood and Affect: Mood normal.        Behavior: Behavior  normal.     RECENT LABS AND TESTS: BMET    Component Value Date/Time   NA 141 09/14/2018 1217   K 4.3 09/14/2018 1217   CL 103 09/14/2018 1217   CO2 24 09/14/2018 1217   GLUCOSE 99 09/14/2018 1217   GLUCOSE 92 10/14/2017 1923   BUN 20 09/14/2018 1217   CREATININE 0.74 09/14/2018 1217   CALCIUM 9.6 09/14/2018 1217   GFRNONAA 83 09/14/2018 1217   GFRAA 96 09/14/2018 1217   Lab Results  Component Value Date   HGBA1C 5.4 09/14/2018   HGBA1C 5.6 03/03/2018   HGBA1C 5.6 10/15/2017   HGBA1C 5.6 06/16/2017   Lab Results  Component Value Date   INSULIN 4.2 09/14/2018   INSULIN 5.0 03/03/2018   INSULIN 5.9 10/15/2017   INSULIN 6.4 06/16/2017   CBC    Component Value Date/Time   WBC 7.4 03/03/2018 1116   WBC 12.2 (H) 10/14/2017 1923   RBC 4.20 03/03/2018 1116   RBC 4.21 10/14/2017 1923   HGB 13.2 03/03/2018 1116   HCT 39.3 03/03/2018 1116   PLT 235 10/14/2017 1923   MCV 94 03/03/2018 1116   MCH 31.4 03/03/2018 1116   MCH 31.8 10/14/2017 1923   MCHC 33.6 03/03/2018 1116   MCHC 33.8 10/14/2017 1923   RDW 11.9 03/03/2018 1116   LYMPHSABS 3.0 03/03/2018 1116   MONOABS 0.8 10/14/2017 1923   EOSABS 0.4 03/03/2018 1116   BASOSABS 0.1 03/03/2018 1116   Iron/TIBC/Ferritin/ %Sat    Component Value Date/Time   IRON 62 01/09/2015 0806   FERRITIN 26.6 01/09/2015 0806   IRONPCTSAT 15.2 (L) 01/09/2015 0806   Lipid Panel     Component Value Date/Time   CHOL 156 09/14/2018 1217   TRIG 85 09/14/2018 1217   HDL 74 09/14/2018 1217   CHOLHDL 2.5 10/29/2016 1030   CHOLHDL 3 11/30/2015 1028   VLDL 25.4 11/30/2015 1028  LDLCALC 65 09/14/2018 1217   Hepatic Function Panel     Component Value Date/Time   PROT 6.4 09/14/2018 1217   ALBUMIN 4.6 09/14/2018 1217   AST 23 09/14/2018 1217   ALT 20 09/14/2018 1217   ALKPHOS 66 09/14/2018 1217   BILITOT 0.3 09/14/2018 1217   BILIDIR 0.07 10/29/2016 1030      Component Value Date/Time   TSH 0.931 06/16/2017 0000   TSH 1.43  01/09/2015 0806   TSH 0.876 08/26/2012 1722      OBESITY BEHAVIORAL INTERVENTION VISIT  Today's visit was # 23   Starting weight: 157 lbs Starting date: 06/16/17 Today's weight : 143 lbs  Today's date: 11/09/2018 Total lbs lost to date: 14 At least 15 minutes were spent on discussing the following behavioral intervention visit.   ASK: We discussed the diagnosis of obesity with Courtney [name] today and Courtney [name] agreed to give [place] permission to discuss obesity behavioral modification therapy today.  ASSESS: Courtney [name] has the diagnosis of obesity and her BMI today is 32.93 Courtney [name] is in the action stage of change   ADVISE: Courtney [name] was educated on the multiple health risks of obesity as well as the benefit of weight loss to improve her health. She was advised of the need for long term treatment and the importance of lifestyle modifications to improve her current health and to decrease her risk of future health problems.  AGREE: Multiple dietary modification options and treatment options were discussed and  Courtney [name] agreed to follow the recommendations documented in the above note.  ARRANGE: Courtney [name] was educated on the importance of frequent visits to treat obesity as outlined per CMS and USPSTF guidelines and agreed to schedule her next follow up appointment today.  I, Trixie Dredge, am acting as transcriptionist for Dennard Nip, MD I have reviewed the above documentation for accuracy and completeness, and I agree with the above. -Dennard Nip, MD

## 2018-11-12 ENCOUNTER — Encounter (INDEPENDENT_AMBULATORY_CARE_PROVIDER_SITE_OTHER): Payer: Self-pay | Admitting: Family Medicine

## 2018-11-12 NOTE — Telephone Encounter (Signed)
Please advise 

## 2018-11-20 ENCOUNTER — Encounter: Payer: Medicare HMO | Admitting: Family

## 2018-11-23 DIAGNOSIS — Z6828 Body mass index (BMI) 28.0-28.9, adult: Secondary | ICD-10-CM | POA: Diagnosis not present

## 2018-11-23 DIAGNOSIS — Z01419 Encounter for gynecological examination (general) (routine) without abnormal findings: Secondary | ICD-10-CM | POA: Diagnosis not present

## 2018-11-23 DIAGNOSIS — Z124 Encounter for screening for malignant neoplasm of cervix: Secondary | ICD-10-CM | POA: Diagnosis not present

## 2018-11-23 LAB — HM PAP SMEAR

## 2018-11-25 ENCOUNTER — Telehealth: Payer: Self-pay

## 2018-11-25 ENCOUNTER — Other Ambulatory Visit: Payer: Self-pay

## 2018-11-25 ENCOUNTER — Ambulatory Visit (INDEPENDENT_AMBULATORY_CARE_PROVIDER_SITE_OTHER): Payer: Medicare HMO | Admitting: Family

## 2018-11-25 ENCOUNTER — Encounter: Payer: Self-pay | Admitting: Family

## 2018-11-25 VITALS — BP 106/70 | HR 48 | Temp 97.8°F | Ht 60.0 in | Wt 148.6 lb

## 2018-11-25 DIAGNOSIS — I10 Essential (primary) hypertension: Secondary | ICD-10-CM | POA: Diagnosis not present

## 2018-11-25 DIAGNOSIS — K2100 Gastro-esophageal reflux disease with esophagitis, without bleeding: Secondary | ICD-10-CM | POA: Diagnosis not present

## 2018-11-25 MED ORDER — PANTOPRAZOLE SODIUM 40 MG PO TBEC: 40.0000 mg | DELAYED_RELEASE_TABLET | Freq: Every day | ORAL | 3 refills | Status: DC

## 2018-11-25 NOTE — Progress Notes (Signed)
Courtney [name] is a 68 y.o. female with the following history as recorded in Epic[employer]:  Patient Active Problem List   Diagnosis Date Noted  . Vitamin D deficiency 08/04/2018  . Avulsion fracture of phalanx of right thumb 07/09/2018  . Essential hypertension 11/17/2017  . Elevated sed rate 10/15/2017  . Sphenoid sinusitis 10/15/2017  . Obesity 07/15/2017  . Insomnia 07/15/2017  . Female stress incontinence 07/15/2017  . Anxiety 07/15/2017  . Anal fissure 02/25/2017  . Gastroesophageal reflux disease with esophagitis 02/25/2017  . Vertigo 01/05/2016  . Medicare annual wellness visit, subsequent 11/29/2015  . Osteopenia 06/27/2015  . Routine general medical examination at a health care facility 01/06/2015  . Osteoarthritis of right hip 07/08/2014  . Sinus bradycardia 08/27/2012  . Depression   . Carotid artery disease ([place])   . PVC (premature ventricular contraction)   . Aortic valve sclerosis   . Mixed hyperlipidemia   . Coronary artery disease   . CAD (coronary artery disease)     Current Outpatient Medications  Medication Sig Dispense Refill  . acetaminophen (TYLENOL) 500 MG tablet Take 1,000 mg by mouth every 6 (six) hours as needed (Pain).     [name] aspirin 81 MG tablet Take 81 mg by mouth daily.    [name] ezetimibe (ZETIA) 10 MG tablet Take 1 tablet (10 mg total) by mouth daily. 90 tablet 3  . metoprolol tartrate (LOPRESSOR) 25 MG tablet Take 0.5 tablets (12.5 mg total) by mouth 2 (two) times daily. 90 tablet 3  . nitroGLYCERIN (NITROSTAT) 0.4 MG SL tablet Place 1 tablet (0.4 mg total) under the tongue every 5 (five) minutes as needed for chest pain. 25 tablet 11  . pantoprazole (PROTONIX) 40 MG tablet Take 1 tablet (40 mg total) by mouth daily. 90 tablet 3  . ramipril (ALTACE) 10 MG capsule Take 1 capsule (10 mg total) by mouth daily. 90 capsule 3  . rosuvastatin (CRESTOR) 40 MG tablet Take 1 tablet (40 mg total) by mouth daily. 90 tablet 3  . Vitamin D, Ergocalciferol, (DRISDOL)  1.25 MG (50000 UT) CAPS capsule Take 1 capsule (50,000 Units total) by mouth every 14 (fourteen) days. 2 capsule 0   No current facility-administered medications for this visit.     Allergies: Erythromycin, Penicillins, and Shellfish allergy  Past Medical History:  Diagnosis Date  . Aortic valve sclerosis    echo 83/1517, soft systolic murmur  . 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 Back pain   . Carotid artery disease ([place])    40-59% bilateral, doppler December 2010  . Constipation   . Coronary artery disease    a. BMS-mid RCA 11/2007 b. stress echo 02/2009 subtle inferior HK, lateral ST depressions with stress c. follow-up cath 02/2009: RCA stent patent, severe but stable stenosis of distal posterior lateral branch RCA. LV normal, med tx unless more sx c. ETT Myoview 08/27/12: negative for ischemia; EF 64%  . Decreased hearing   . Depression    Mild, August, 2012  . Dyslipidemia   . Easy bruising   . Ejection fraction    EF normal, catheterization, 2011 /  EF 55%, echo, 2010  . Fluid overload    Mild, stable since hospitalization in the past  . GERD (gastroesophageal reflux disease)    occarionally, takes Pepcid over the counter  . Heart murmur    has, occasional PVC's  . History of right hip replacement   . Hyperlipidemia   . Hypertension   . Joint  pain   . Myocardial infarction ([place])    2009  . Palpitations   . PVC (premature ventricular contraction)    per prior Holter monitor  . Right hip pain 12/2009  . Shellfish allergy   . Trouble in sleeping   . Vitamin B12 deficiency   . Vitamin D deficiency     Past Surgical History:  Procedure Laterality Date  . 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/01/2005  . carcinoid tumor removal    . CARDIAC CATHETERIZATION  1/211   Patent RCA stent, stenotic PLB supplying small distribution; medically managed  . CORONARY ANGIOPLASTY WITH STENT PLACEMENT  11/2007   BMS-mid RCA  . DIAGNOSTIC LAPAROSCOPY     1982 for endometriosis  . DILATION AND CURETTAGE  OF UTERUS  2005  . hx of wisdom teeth surgery    . SKIN CANCER EXCISION    . TOTAL HIP ARTHROPLASTY Right 07/08/2014   Procedure: RIGHT TOTAL HIP ARTHROPLASTY ANTERIOR APPROACH;  Surgeon: Mcarthur Rossetti, MD;  Location: [facility];  Service: Orthopedics;  Laterality: Right;    Family History  Problem Relation Age of Onset  . Heart attack Father 69  . Hyperlipidemia Father   . Hypertension Father   . Sudden death Father   . Stroke Mother   . Hyperlipidemia Mother   . Hypertension Mother   . Heart disease Mother   . Obesity Mother   . Heart attack Paternal Grandfather   . Heart disease Sister   . BRCA 1/2 Neg Hx   . Breast cancer Neg Hx     Social History   Tobacco Use  . Smoking status: Never Smoker  . Smokeless tobacco: Never Used  Substance Use Topics  . Alcohol use: Yes    Alcohol/week: 7.0 standard drinks    Types: 7 Glasses of wine per week    Comment: 1 glass of wine/night    Subjective:   Presents for TOC from PCP who left our office earlier this year; majority of healthcare needs are managed by her cardiologist and weight loss specialist; has GYN and saw them earlier this week for pap smear; up to date on colonoscopy, DEXA and vaccines as well; AWV was done earlier this year; Needs refill on her Protonix which she takes for GERD;     Objective:  Vitals:   11/25/18 1045  BP: 106/70  Pulse: (!) 48  Temp: 97.8 F (36.6 C)  TempSrc: Oral  SpO2: 97%  Weight: 148 lb 9.6 oz (67.4 kg)  Height: 5' (1.524 m)    General: Well developed, well nourished, in no acute distress  Skin : Warm and dry.  Head: Normocephalic and atraumatic  Eyes: Sclera and conjunctiva clear; pupils round and reactive to light; extraocular movements intact  Ears: External normal; canals clear; tympanic membranes normal  Oropharynx: Pink, supple. No suspicious lesions  Neck: Supple without thyromegaly, adenopathy  Lungs: Respirations unlabored; clear to auscultation bilaterally without  wheeze, rales, rhonchi  CVS exam: normal rate and regular rhythm. Murmur noted Neurologic: Alert and oriented; speech intact; face symmetrical; moves all extremities well; CNII-XII intact without focal deficit   Assessment:  1. Gastroesophageal reflux disease with esophagitis, unspecified whether hemorrhage   2. Essential hypertension     Plan:  1. Refill updated on Protonix x 1 year; 2. Stable- medications managed by her cardiologist; Labs are being managed by her weight loss specialist; has GYN;    No follow-ups on file.  No orders of the defined types were placed in this  encounter.   Requested Prescriptions   Signed Prescriptions Disp Refills  . pantoprazole (PROTONIX) 40 MG tablet 90 tablet 3    Sig: Take 1 tablet (40 mg total) by mouth daily.

## 2018-11-25 NOTE — Telephone Encounter (Signed)
Results sent over to office today.

## 2018-11-26 ENCOUNTER — Encounter: Payer: Self-pay | Admitting: Family

## 2018-11-26 NOTE — Progress Notes (Signed)
Outside notes received. Information abstracted. Notes sent to scan.  

## 2018-12-08 ENCOUNTER — Encounter (INDEPENDENT_AMBULATORY_CARE_PROVIDER_SITE_OTHER): Payer: Self-pay | Admitting: Family Medicine

## 2018-12-08 ENCOUNTER — Ambulatory Visit (INDEPENDENT_AMBULATORY_CARE_PROVIDER_SITE_OTHER): Payer: Medicare HMO | Admitting: Family Medicine

## 2018-12-08 ENCOUNTER — Other Ambulatory Visit: Payer: Self-pay

## 2018-12-08 VITALS — BP 97/57 | HR 56 | Temp 97.8°F | Ht 60.0 in | Wt 142.0 lb

## 2018-12-08 DIAGNOSIS — E669 Obesity, unspecified: Secondary | ICD-10-CM | POA: Diagnosis not present

## 2018-12-08 DIAGNOSIS — Z6830 Body mass index (BMI) 30.0-30.9, adult: Secondary | ICD-10-CM | POA: Diagnosis not present

## 2018-12-08 DIAGNOSIS — E559 Vitamin D deficiency, unspecified: Secondary | ICD-10-CM

## 2018-12-08 MED ORDER — VITAMIN D (ERGOCALCIFEROL) 1.25 MG (50000 UNIT) PO CAPS: 50000.0000 [IU] | ORAL_CAPSULE | ORAL | 0 refills | Status: DC

## 2018-12-10 NOTE — Progress Notes (Signed)
Office: [phone]  /  Fax: [phone]   HPI:   Chief Complaint: OBESITY Courtney [name] is here to discuss her progress with her obesity treatment plan. She is on the Category 1 plan and is following her eating plan approximately 90 % of the time. She states she is walking for 20-30 minutes 3 times per week. Courtney [name] has done well maintaining her weight and has even lost another lb in the last 6 weeks. Her hunger is controlled and she is doing well with meal planning.  Her weight is 142 lb (64.4 kg) today and has had a weight loss of 1 pound over a period of 4 weeks since her last visit. She has lost 15 lbs since starting treatment with [place].  Vitamin D Deficiency Courtney [name] has a diagnosis of vitamin D deficiency. She is stable on prescription Vit D and denies nausea, vomiting or muscle weakness.  ASSESSMENT AND PLAN:  Vitamin D deficiency - Plan: Vitamin D, Ergocalciferol, (DRISDOL) 1.25 MG (50000 UT) CAPS capsule  Class 1 obesity with serious comorbidity and body mass index (BMI) of 30.0 to 30.9 in adult, unspecified obesity type - BMI>than 30 at start of program   PLAN:  Vitamin D Deficiency Courtney [name] was informed that low vitamin D levels contributes to fatigue and are associated with obesity, breast, and colon cancer. Courtney [name] agrees to continue to taking prescription Vit D 50,000 IU every 14 days #2 and we will refill for 1 month. She will follow up for routine testing of vitamin D, at least 2-3 times per year. She was informed of the risk of over-replacement of vitamin D and agrees to not increase her dose unless she discusses this with [place] first. Courtney [name] agrees to follow up with our clinic in 3 to 4 weeks.  Obesity Courtney [name] is currently in the action stage of change. As such, her goal is to continue with weight loss efforts She has agreed to follow the Category 1 plan Courtney [name] has been instructed to work up to a goal of 150 minutes of combined cardio and strengthening exercise per week for weight loss and  overall health benefits. We discussed the following Behavioral Modification Strategies today: decrease eating out and work on meal planning and easy cooking plans   Courtney [name] has agreed to follow up with our clinic in 3 to 4 weeks. She was informed of the importance of frequent follow up visits to maximize her success with intensive lifestyle modifications for her multiple health conditions.  ALLERGIES: Allergies  Allergen Reactions  . Erythromycin Hives  . Penicillins Hives    Has patient had a PCN reaction causing immediate rash, facial/tongue/throat swelling, SOB or lightheadedness with hypotension: No Has patient had a PCN reaction causing severe rash involving mucus membranes or skin necrosis: No Has patient had a PCN reaction that required hospitalization: No Has patient had a PCN reaction occurring within the last 10 years: No If all of the above answers are "NO", then may proceed with Cephalosporin use.   Courtney [name] Kitchen Shellfish Allergy Hives    MEDICATIONS: Current Outpatient Medications on File Prior to Visit  Medication Sig Dispense Refill  . acetaminophen (TYLENOL) 500 MG tablet Take 1,000 mg by mouth every 6 (six) hours as needed (Pain).     Courtney [name] Kitchen aspirin 81 MG tablet Take 81 mg by mouth daily.    Courtney [name] Kitchen ezetimibe (ZETIA) 10 MG tablet Take 1 tablet (10 mg total) by mouth daily. 90 tablet 3  . metoprolol tartrate (LOPRESSOR) 25 MG tablet Take 0.5 tablets (  12.5 mg total) by mouth 2 (two) times daily. 90 tablet 3  . nitroGLYCERIN (NITROSTAT) 0.4 MG SL tablet Place 1 tablet (0.4 mg total) under the tongue every 5 (five) minutes as needed for chest pain. 25 tablet 11  . pantoprazole (PROTONIX) 40 MG tablet Take 1 tablet (40 mg total) by mouth daily. 90 tablet 3  . ramipril (ALTACE) 10 MG capsule Take 1 capsule (10 mg total) by mouth daily. 90 capsule 3  . rosuvastatin (CRESTOR) 40 MG tablet Take 1 tablet (40 mg total) by mouth daily. 90 tablet 3   No current facility-administered medications on file  prior to visit.     PAST MEDICAL HISTORY: Past Medical History:  Diagnosis Date  . Aortic valve sclerosis    echo 38/7564, soft systolic murmur  . 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 Back pain   . Carotid artery disease ([place])    40-59% bilateral, doppler December 2010  . Constipation   . Coronary artery disease    a. BMS-mid RCA 11/2007 b. stress echo 02/2009 subtle inferior HK, lateral ST depressions with stress c. follow-up cath 02/2009: RCA stent patent, severe but stable stenosis of distal posterior lateral branch RCA. LV normal, med tx unless more sx c. ETT Myoview 08/27/12: negative for ischemia; EF 64%  . Decreased hearing   . Depression    Mild, August, 2012  . Dyslipidemia   . Easy bruising   . Ejection fraction    EF normal, catheterization, 2011 /  EF 55%, echo, 2010  . Fluid overload    Mild, stable since hospitalization in the past  . GERD (gastroesophageal reflux disease)    occarionally, takes Pepcid over the counter  . Heart murmur    has, occasional PVC's  . History of right hip replacement   . Hyperlipidemia   . Hypertension   . Joint pain   . Myocardial infarction ([place])    2009  . Palpitations   . PVC (premature ventricular contraction)    per prior Holter monitor  . Right hip pain 12/2009  . Shellfish allergy   . Trouble in sleeping   . Vitamin B12 deficiency   . Vitamin D deficiency     PAST SURGICAL HISTORY: Past Surgical History:  Procedure Laterality Date  . 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/01/2005  . carcinoid tumor removal    . CARDIAC CATHETERIZATION  1/211   Patent RCA stent, stenotic PLB supplying small distribution; medically managed  . CORONARY ANGIOPLASTY WITH STENT PLACEMENT  11/2007   BMS-mid RCA  . DIAGNOSTIC LAPAROSCOPY     1982 for endometriosis  . DILATION AND CURETTAGE OF UTERUS  2005  . hx of wisdom teeth surgery    . SKIN CANCER EXCISION    . TOTAL HIP ARTHROPLASTY Right 07/08/2014   Procedure: RIGHT TOTAL HIP ARTHROPLASTY ANTERIOR  APPROACH;  Surgeon: Mcarthur Rossetti, MD;  Location: [facility];  Service: Orthopedics;  Laterality: Right;    SOCIAL HISTORY: Social History   Tobacco Use  . Smoking status: Never Smoker  . Smokeless tobacco: Never Used  Substance Use Topics  . Alcohol use: Yes    Alcohol/week: 7.0 standard drinks    Types: 7 Glasses of wine per week    Comment: 1 glass of wine/night  . Drug use: No    FAMILY HISTORY: Family History  Problem Relation Age of Onset  . Heart attack Father 66  . Hyperlipidemia Father   . Hypertension Father   . Sudden  death Father   . Stroke Mother   . Hyperlipidemia Mother   . Hypertension Mother   . Heart disease Mother   . Obesity Mother   . Heart attack Paternal Grandfather   . Heart disease Sister   . BRCA 1/2 Neg Hx   . Breast cancer Neg Hx     ROS: Review of Systems  Constitutional: Positive for weight loss.  Gastrointestinal: Negative for nausea and vomiting.  Musculoskeletal:       Negative muscle weakness    PHYSICAL EXAM: Blood pressure (!) 97/57, pulse (!) 56, temperature 97.8 F (36.6 C), temperature source Oral, height 5' (1.524 m), weight 142 lb (64.4 kg), SpO2 96 %. Body mass index is 27.73 kg/m. Physical Exam Vitals signs reviewed.  Constitutional:      Appearance: Normal appearance. She is obese.  Cardiovascular:     Rate and Rhythm: Normal rate.     Pulses: Normal pulses.  Pulmonary:     Effort: Pulmonary effort is normal.     Breath sounds: Normal breath sounds.  Musculoskeletal: Normal range of motion.  Skin:    General: Skin is warm and dry.  Neurological:     Mental Status: She is alert and oriented to person, place, and time.  Psychiatric:        Mood and Affect: Mood normal.        Behavior: Behavior normal.     RECENT LABS AND TESTS: BMET    Component Value Date/Time   NA 141 09/14/2018 1217   K 4.3 09/14/2018 1217   CL 103 09/14/2018 1217   CO2 24 09/14/2018 1217   GLUCOSE 99 09/14/2018 1217    GLUCOSE 92 10/14/2017 1923   BUN 20 09/14/2018 1217   CREATININE 0.74 09/14/2018 1217   CALCIUM 9.6 09/14/2018 1217   GFRNONAA 83 09/14/2018 1217   GFRAA 96 09/14/2018 1217   Lab Results  Component Value Date   HGBA1C 5.4 09/14/2018   HGBA1C 5.6 03/03/2018   HGBA1C 5.6 10/15/2017   HGBA1C 5.6 06/16/2017   Lab Results  Component Value Date   INSULIN 4.2 09/14/2018   INSULIN 5.0 03/03/2018   INSULIN 5.9 10/15/2017   INSULIN 6.4 06/16/2017   CBC    Component Value Date/Time   WBC 7.4 03/03/2018 1116   WBC 12.2 (H) 10/14/2017 1923   RBC 4.20 03/03/2018 1116   RBC 4.21 10/14/2017 1923   HGB 13.2 03/03/2018 1116   HCT 39.3 03/03/2018 1116   PLT 235 10/14/2017 1923   MCV 94 03/03/2018 1116   MCH 31.4 03/03/2018 1116   MCH 31.8 10/14/2017 1923   MCHC 33.6 03/03/2018 1116   MCHC 33.8 10/14/2017 1923   RDW 11.9 03/03/2018 1116   LYMPHSABS 3.0 03/03/2018 1116   MONOABS 0.8 10/14/2017 1923   EOSABS 0.4 03/03/2018 1116   BASOSABS 0.1 03/03/2018 1116   Iron/TIBC/Ferritin/ %Sat    Component Value Date/Time   IRON 62 01/09/2015 0806   FERRITIN 26.6 01/09/2015 0806   IRONPCTSAT 15.2 (L) 01/09/2015 0806   Lipid Panel     Component Value Date/Time   CHOL 156 09/14/2018 1217   TRIG 85 09/14/2018 1217   HDL 74 09/14/2018 1217   CHOLHDL 2.5 10/29/2016 1030   CHOLHDL 3 11/30/2015 1028   VLDL 25.4 11/30/2015 1028   LDLCALC 65 09/14/2018 1217   Hepatic Function Panel     Component Value Date/Time   PROT 6.4 09/14/2018 1217   ALBUMIN 4.6 09/14/2018 1217   AST 23  09/14/2018 1217   ALT 20 09/14/2018 1217   ALKPHOS 66 09/14/2018 1217   BILITOT 0.3 09/14/2018 1217   BILIDIR 0.07 10/29/2016 1030      Component Value Date/Time   TSH 0.931 06/16/2017 0000   TSH 1.43 01/09/2015 0806   TSH 0.876 08/26/2012 1722      OBESITY BEHAVIORAL INTERVENTION VISIT  Today's visit was # 24   Starting weight: 157 lbs Starting date: 06/16/17 Today's weight : 142 lbs Today's date:  12/08/2018 Total lbs lost to date: 15 At least 15 minutes were spent on discussing the following behavioral intervention visit.   ASK: We discussed the diagnosis of obesity with [name] today and Satcha agreed to give [place] permission to discuss obesity behavioral modification therapy today.  ASSESS: Hula has the diagnosis of obesity and her BMI today is 27.73 Parker is in the action stage of change   ADVISE: Alanii was educated on the multiple health risks of obesity as well as the benefit of weight loss to improve her health. She was advised of the need for long term treatment and the importance of lifestyle modifications to improve her current health and to decrease her risk of future health problems.  AGREE: Multiple dietary modification options and treatment options were discussed and  Dimitra agreed to follow the recommendations documented in the above note.  ARRANGE: Jennae was educated on the importance of frequent visits to treat obesity as outlined per CMS and USPSTF guidelines and agreed to schedule her next follow up appointment today.  I, Trixie Dredge, am acting as transcriptionist for Dennard Nip, MD  I have reviewed the above documentation for accuracy and completeness, and I agree with the above. -Dennard Nip, MD

## 2018-12-18 ENCOUNTER — Telehealth: Payer: Self-pay

## 2018-12-18 NOTE — Telephone Encounter (Signed)
Received prior authorization for omeprazole.Spoke to patient she is taking protonix.She doe not take omeprazole.

## 2019-01-05 ENCOUNTER — Ambulatory Visit (INDEPENDENT_AMBULATORY_CARE_PROVIDER_SITE_OTHER): Payer: Medicare HMO | Admitting: Family Medicine

## 2019-01-08 DIAGNOSIS — H25811 Combined forms of age-related cataract, right eye: Secondary | ICD-10-CM | POA: Diagnosis not present

## 2019-01-08 DIAGNOSIS — H2511 Age-related nuclear cataract, right eye: Secondary | ICD-10-CM | POA: Diagnosis not present

## 2019-01-08 DIAGNOSIS — Z01818 Encounter for other preprocedural examination: Secondary | ICD-10-CM | POA: Diagnosis not present

## 2019-01-12 ENCOUNTER — Ambulatory Visit (INDEPENDENT_AMBULATORY_CARE_PROVIDER_SITE_OTHER): Payer: Medicare HMO | Admitting: Physician Assistant

## 2019-01-12 ENCOUNTER — Encounter (INDEPENDENT_AMBULATORY_CARE_PROVIDER_SITE_OTHER): Payer: Self-pay | Admitting: Physician Assistant

## 2019-01-12 ENCOUNTER — Other Ambulatory Visit: Payer: Self-pay

## 2019-01-12 VITALS — BP 118/64 | HR 53 | Temp 97.8°F | Ht 60.0 in | Wt 144.0 lb

## 2019-01-12 DIAGNOSIS — E559 Vitamin D deficiency, unspecified: Secondary | ICD-10-CM

## 2019-01-12 DIAGNOSIS — Z6830 Body mass index (BMI) 30.0-30.9, adult: Secondary | ICD-10-CM

## 2019-01-12 DIAGNOSIS — E669 Obesity, unspecified: Secondary | ICD-10-CM

## 2019-01-12 DIAGNOSIS — E7849 Other hyperlipidemia: Secondary | ICD-10-CM

## 2019-01-12 MED ORDER — VITAMIN D (ERGOCALCIFEROL) 1.25 MG (50000 UNIT) PO CAPS: 50000.0000 [IU] | ORAL_CAPSULE | ORAL | 0 refills | Status: DC

## 2019-01-18 NOTE — Progress Notes (Signed)
Office: [phone]  /  Fax: [phone]   HPI:   Chief Complaint: OBESITY Courtney [name] is here to discuss her progress with her obesity treatment plan. She is on the Category 1 plan and is following her eating plan approximately 90 % of the time. She states she is walking 20 to 60 minutes 4 times per week. Courtney [name] reports frustration with the lack of weight loss. She has been following the plan well and she continues to weigh her protein. Courtney [name] reports hunger in the middle of the night at times. Her weight is 144 lb (65.3 kg) today and has had a weight gain of 2 pounds over a period of 5 weeks since her last visit. She has lost 13 lbs since starting treatment with [place].  Hyperlipidemia Courtney [name] has hyperlipidemia and she is on Crestor and Zetia. She has been trying to improve her cholesterol levels with intensive lifestyle modification including a low saturated fat diet, exercise and weight loss. She denies any chest pain.  Vitamin D deficiency Courtney [name] has a diagnosis of vitamin D deficiency. Courtney [name] is currently taking vit D and she is at risk for over-supplementation. She denies nausea, vomiting or muscle weakness.  ASSESSMENT AND PLAN:  Other hyperlipidemia  Vitamin D deficiency - Plan: Vitamin D, Ergocalciferol, (DRISDOL) 1.25 MG (50000 UT) CAPS capsule  Class 1 obesity with serious comorbidity and body mass index (BMI) of 30.0 to 30.9 in adult, unspecified obesity type - BMI greater than 30 at start of program   PLAN:  Hyperlipidemia Courtney [name] was informed of the American Heart Association Guidelines emphasizing intensive lifestyle modifications as the first line treatment for hyperlipidemia. We discussed many lifestyle modifications today in depth, and Courtney [name] will continue to work on decreasing saturated fats such as fatty red meat, butter and many fried foods. She will also increase vegetables and lean protein in her diet and continue to work on exercise and weight loss efforts. Courtney [name] will continue  Crestor and Zetia and follow up as directed.  Vitamin D Deficiency Courtney [name] was informed that low vitamin D levels contributes to fatigue and are associated with obesity, breast, and colon cancer. Courtney [name] agrees to continue to take prescription Vit D _0 ,000 IU every other week #2 with no refills and she will follow up for routine testing of vitamin D, at least 2-3 times per year. She was informed of the risk of over-replacement of vitamin D and agrees to not increase her dose unless she discusses this with [place] first. Courtney [name] agrees to follow up as directed.  Obesity Courtney [name] is currently in the action stage of change. As such, her goal is to continue with weight loss efforts She has agreed to follow the Category 1 plan Courtney [name] has been instructed to work up to a goal of 150 minutes of combined cardio and strengthening exercise per week for weight loss and overall health benefits. We discussed the following Behavioral Modification Strategies today: keeping healthy foods in the home and work on meal planning and easy cooking plans  We will check indirect calorimeter at the next visit.  Courtney [name] has agreed to follow up with our clinic in 2 weeks. She was informed of the importance of frequent follow up visits to maximize her success with intensive lifestyle modifications for her multiple health conditions.  ALLERGIES: Allergies  Allergen Reactions   Erythromycin Hives   Penicillins Hives    Has patient had a PCN reaction causing immediate rash, facial/tongue/throat swelling, SOB or lightheadedness with hypotension: No Has patient had  a PCN reaction causing severe rash involving mucus membranes or skin necrosis: No Has patient had a PCN reaction that required hospitalization: No Has patient had a PCN reaction occurring within the last 10 years: No If all of the above answers are "NO", then may proceed with Cephalosporin use.    Shellfish Allergy Hives    MEDICATIONS: Current Outpatient Medications on  File Prior to Visit  Medication Sig Dispense Refill   acetaminophen (TYLENOL) 500 MG tablet Take 1,000 mg by mouth every 6 (six) hours as needed (Pain).      aspirin 81 MG tablet Take 81 mg by mouth daily.     ezetimibe (ZETIA) 10 MG tablet Take 1 tablet (10 mg total) by mouth daily. 90 tablet 3   metoprolol tartrate (LOPRESSOR) 25 MG tablet Take 0.5 tablets (12.5 mg total) by mouth 2 (two) times daily. 90 tablet 3   nitroGLYCERIN (NITROSTAT) 0.4 MG SL tablet Place 1 tablet (0.4 mg total) under the tongue every 5 (five) minutes as needed for chest pain. 25 tablet 11   pantoprazole (PROTONIX) 40 MG tablet Take 1 tablet (40 mg total) by mouth daily. 90 tablet 3   ramipril (ALTACE) 10 MG capsule Take 1 capsule (10 mg total) by mouth daily. 90 capsule 3   rosuvastatin (CRESTOR) 40 MG tablet Take 1 tablet (40 mg total) by mouth daily. 90 tablet 3   No current facility-administered medications on file prior to visit.     PAST MEDICAL HISTORY: Past Medical History:  Diagnosis Date   Aortic valve sclerosis    echo 16/6060, soft systolic murmur   Arthritis    in right hip   Back pain    Carotid artery disease (HCC)    40-59% bilateral, doppler December 2010   Constipation    Coronary artery disease    a. BMS-mid RCA 11/2007 b. stress echo 02/2009 subtle inferior HK, lateral ST depressions with stress c. follow-up cath 02/2009: RCA stent patent, severe but stable stenosis of distal posterior lateral branch RCA. LV normal, med tx unless more sx c. ETT Myoview 08/27/12: negative for ischemia; EF 64%   Decreased hearing    Depression    Mild, August, 2012   Dyslipidemia    Easy bruising    Ejection fraction    EF normal, catheterization, 2011 /  EF 55%, echo, 2010   Fluid overload    Mild, stable since hospitalization in the past   GERD (gastroesophageal reflux disease)    occarionally, takes Pepcid over the counter   Heart murmur    has, occasional PVC's   History of  right hip replacement    Hyperlipidemia    Hypertension    Joint pain    Myocardial infarction ([place])    2009   Palpitations    PVC (premature ventricular contraction)    per prior Holter monitor   Right hip pain 12/2009   Shellfish allergy    Trouble in sleeping    Vitamin B12 deficiency    Vitamin D deficiency     PAST SURGICAL HISTORY: Past Surgical History:  Procedure Laterality Date   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/01/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     CARDIAC CATHETERIZATION  1/211   Patent RCA stent, stenotic PLB supplying small distribution; medically managed   CORONARY ANGIOPLASTY WITH STENT PLACEMENT  11/2007   BMS-mid RCA   DIAGNOSTIC LAPAROSCOPY     1982 for endometriosis   DILATION AND CURETTAGE OF UTERUS  2005   hx  of wisdom teeth surgery     SKIN CANCER EXCISION     TOTAL HIP ARTHROPLASTY Right 07/08/2014   Procedure: RIGHT TOTAL HIP ARTHROPLASTY ANTERIOR APPROACH;  Surgeon: Mcarthur Rossetti, MD;  Location: [facility];  Service: Orthopedics;  Laterality: Right;    SOCIAL HISTORY: Social History   Tobacco Use   Smoking status: Never Smoker   Smokeless tobacco: Never Used  Substance Use Topics   Alcohol use: Yes    Alcohol/week: 7.0 standard drinks    Types: 7 Glasses of wine per week    Comment: 1 glass of wine/night   Drug use: No    FAMILY HISTORY: Family History  Problem Relation Age of Onset   Heart attack Father 15   Hyperlipidemia Father    Hypertension Father    Sudden death Father    Stroke Mother    Hyperlipidemia Mother    Hypertension Mother    Heart disease Mother    Obesity Mother    Heart attack Paternal Grandfather    Heart disease Sister    BRCA 1/2 Neg Hx    Breast cancer Neg Hx     ROS: Review of Systems  Constitutional: Negative for weight loss.  Cardiovascular: Negative for chest pain.  Gastrointestinal: Negative for nausea and vomiting.  Musculoskeletal:       Negative for muscle  weakness    PHYSICAL EXAM: Blood pressure 118/64, pulse (!) 53, temperature 97.8 F (36.6 C), temperature source Oral, height 5' (1.524 m), weight 144 lb (65.3 kg), SpO2 (!) 10 %. Body mass index is 28.12 kg/m. Physical Exam Vitals signs reviewed.  Constitutional:      Appearance: Normal appearance. She is well-developed. She is obese.  Cardiovascular:     Rate and Rhythm: Normal rate.  Pulmonary:     Effort: Pulmonary effort is normal.  Musculoskeletal: Normal range of motion.  Skin:    General: Skin is warm and dry.  Neurological:     Mental Status: She is alert and oriented to person, place, and time.  Psychiatric:        Mood and Affect: Mood normal.        Behavior: Behavior normal.     RECENT LABS AND TESTS: BMET    Component Value Date/Time   NA 141 09/14/2018 1217   K 4.3 09/14/2018 1217   CL 103 09/14/2018 1217   CO2 24 09/14/2018 1217   GLUCOSE 99 09/14/2018 1217   GLUCOSE 92 10/14/2017 1923   BUN 20 09/14/2018 1217   CREATININE 0.74 09/14/2018 1217   CALCIUM 9.6 09/14/2018 1217   GFRNONAA 83 09/14/2018 1217   GFRAA 96 09/14/2018 1217   Lab Results  Component Value Date   HGBA1C 5.4 09/14/2018   HGBA1C 5.6 03/03/2018   HGBA1C 5.6 10/15/2017   HGBA1C 5.6 06/16/2017   Lab Results  Component Value Date   INSULIN 4.2 09/14/2018   INSULIN 5.0 03/03/2018   INSULIN 5.9 10/15/2017   INSULIN 6.4 06/16/2017   CBC    Component Value Date/Time   WBC 7.4 03/03/2018 1116   WBC 12.2 (H) 10/14/2017 1923   RBC 4.20 03/03/2018 1116   RBC 4.21 10/14/2017 1923   HGB 13.2 03/03/2018 1116   HCT 39.3 03/03/2018 1116   PLT 235 10/14/2017 1923   MCV 94 03/03/2018 1116   MCH 31.4 03/03/2018 1116   MCH 31.8 10/14/2017 1923   MCHC 33.6 03/03/2018 1116   MCHC 33.8 10/14/2017 1923   RDW 11.9 03/03/2018 1116  LYMPHSABS 3.0 03/03/2018 1116   MONOABS 0.8 10/14/2017 1923   EOSABS 0.4 03/03/2018 1116   BASOSABS 0.1 03/03/2018 1116   Iron/TIBC/Ferritin/ %Sat      Component Value Date/Time   IRON 62 01/09/2015 0806   FERRITIN 26.6 01/09/2015 0806   IRONPCTSAT 15.2 (L) 01/09/2015 0806   Lipid Panel     Component Value Date/Time   CHOL 156 09/14/2018 1217   TRIG 85 09/14/2018 1217   HDL 74 09/14/2018 1217   CHOLHDL 2.5 10/29/2016 1030   CHOLHDL 3 11/30/2015 1028   VLDL 25.4 11/30/2015 1028   LDLCALC 65 09/14/2018 1217   Hepatic Function Panel     Component Value Date/Time   PROT 6.4 09/14/2018 1217   ALBUMIN 4.6 09/14/2018 1217   AST 23 09/14/2018 1217   ALT 20 09/14/2018 1217   ALKPHOS 66 09/14/2018 1217   BILITOT 0.3 09/14/2018 1217   BILIDIR 0.07 10/29/2016 1030      Component Value Date/Time   TSH 0.931 06/16/2017 0000   TSH 1.43 01/09/2015 0806   TSH 0.876 08/26/2012 1722     Ref. Range 09/14/2018 12:17  Vitamin D, 25-Hydroxy Latest Ref Range: 30.0 - 100.0 ng/mL 64.6    OBESITY BEHAVIORAL INTERVENTION VISIT  Today's visit was # 25   Starting weight: 157 lbs Starting date: 06/16/2017 Today's weight : 144 lbs Today's date: 01/12/2019 Total lbs lost to date: 13    01/12/2019  Height 5' (1.524 m)  Weight 144 lb (65.3 kg)  BMI (Calculated) 28.12  BLOOD PRESSURE - SYSTOLIC 397  BLOOD PRESSURE - DIASTOLIC 64   Body Fat % 67.3 %  Total Body Water (lbs) 58.2 lbs    ASK: We discussed the diagnosis of obesity with Courtney [name] today and Courtney [name] agreed to give [place] permission to discuss obesity behavioral modification therapy today.  ASSESS: Courtney [name] has the diagnosis of obesity and her BMI today is 28.12 Courtney [name] is in the action stage of change   ADVISE: Courtney [name] was educated on the multiple health risks of obesity as well as the benefit of weight loss to improve her health. She was advised of the need for long term treatment and the importance of lifestyle modifications to improve her current health and to decrease her risk of future health problems.  AGREE: Multiple dietary modification options and treatment options were  discussed and  Courtney [name] agreed to follow the recommendations documented in the above note.  ARRANGE: Courtney [name] was educated on the importance of frequent visits to treat obesity as outlined per CMS and USPSTF guidelines and agreed to schedule her next follow up appointment today.  Corey Skains, am acting as transcriptionist for Abby Potash, PA-C I, Abby Potash, PA-C have reviewed above note and agree with its content

## 2019-01-25 ENCOUNTER — Other Ambulatory Visit: Payer: Self-pay

## 2019-01-25 ENCOUNTER — Ambulatory Visit (INDEPENDENT_AMBULATORY_CARE_PROVIDER_SITE_OTHER): Payer: Medicare HMO | Admitting: Family Medicine

## 2019-01-25 ENCOUNTER — Encounter (INDEPENDENT_AMBULATORY_CARE_PROVIDER_SITE_OTHER): Payer: Self-pay | Admitting: Family Medicine

## 2019-01-25 VITALS — BP 115/54 | HR 51 | Temp 98.0°F | Ht 60.0 in | Wt 146.0 lb

## 2019-01-25 DIAGNOSIS — M8580 Other specified disorders of bone density and structure, unspecified site: Secondary | ICD-10-CM

## 2019-01-25 DIAGNOSIS — R0602 Shortness of breath: Secondary | ICD-10-CM | POA: Diagnosis not present

## 2019-01-25 DIAGNOSIS — Z6830 Body mass index (BMI) 30.0-30.9, adult: Secondary | ICD-10-CM

## 2019-01-25 DIAGNOSIS — R7309 Other abnormal glucose: Secondary | ICD-10-CM | POA: Diagnosis not present

## 2019-01-25 DIAGNOSIS — E7849 Other hyperlipidemia: Secondary | ICD-10-CM

## 2019-01-25 DIAGNOSIS — E559 Vitamin D deficiency, unspecified: Secondary | ICD-10-CM | POA: Diagnosis not present

## 2019-01-25 DIAGNOSIS — G4709 Other insomnia: Secondary | ICD-10-CM

## 2019-01-25 DIAGNOSIS — E669 Obesity, unspecified: Secondary | ICD-10-CM | POA: Diagnosis not present

## 2019-01-25 DIAGNOSIS — G4700 Insomnia, unspecified: Secondary | ICD-10-CM | POA: Diagnosis not present

## 2019-01-25 DIAGNOSIS — I25119 Atherosclerotic heart disease of native coronary artery with unspecified angina pectoris: Secondary | ICD-10-CM

## 2019-01-25 MED ORDER — TRAZODONE HCL 50 MG PO TABS: 25.0000 mg | ORAL_TABLET | Freq: Every day | ORAL | 0 refills | Status: DC

## 2019-01-26 ENCOUNTER — Encounter (INDEPENDENT_AMBULATORY_CARE_PROVIDER_SITE_OTHER): Payer: Self-pay | Admitting: Family Medicine

## 2019-01-26 NOTE — Progress Notes (Signed)
Office: [phone]  /  Fax: [phone]   HPI:   Chief Complaint: OBESITY Courtney [name] is here to discuss her progress with her obesity treatment plan. She is on the Category 1 plan and is following her eating plan approximately 70 % of the time. She states she walked for 45 minutes 2 times per week. Courtney [name] is frustrated with lack of weight loss, but she admits to eating a lot of celebration meals since her last visit. She is ready to get "back to it". She is doing well with the meal plan. She is not sleeping well and can not stay asleep. She is not doing strength training. She is working 2 days a week at [employer] center. Her weight is 146 lb (66.2 kg) today and has gained 2 lbs since her last visit. She has lost 11 lbs since starting treatment with [place].  Hyperlipidemia Courtney [name] has hyperlipidemia and has been trying to improve her cholesterol levels with intensive lifestyle modification including a low saturated fat diet, exercise and weight loss. She is stable on statin and denies any chest pain, claudication or myalgias.  Vitamin D Deficiency Courtney [name] has a diagnosis of vitamin D deficiency. She is stable on prescription Vit D and denies nausea, vomiting or muscle weakness.  Coronary Artery Disease Courtney [name] has a history of myocardial infarction. Her vitals look great. She denies chest pain, shortness of breath, or edema.  Osteopenia Courtney [name] is on Vit D supplementation. We reviewed strength training and recommended physical therapy pilate.  Insomnia Courtney [name] complains of insomnia and is also with restless legs syndrome. We reviewed options, and she has tried sleep hygiene, supplements, tea, Ambien, and Xanax.  Shortness of Breath with Exertion Courtney [name] notes increasing shortness of breath with exercising and seems to be worsening over time with weight gain. She notes getting out of breath sooner with activity than she used to. This has not gotten worse recently. Courtney [name] denies shortness of breath at rest  or orthopnea.  ASSESSMENT AND PLAN:  Other hyperlipidemia - Plan: Lipid Panel With LDL/HDL Ratio  Vitamin D deficiency - Plan: Vitamin D (25 hydroxy)  Coronary artery disease with angina pectoris, unspecified vessel or lesion type, unspecified whether native or transplanted heart ([place]) - Plan: Comprehensive metabolic panel, CBC with Differential/Platelet, Anemia panel  Osteopenia, unspecified location - Plan: Vitamin D (25 hydroxy), Anemia panel  Other insomnia - Plan: traZODone (DESYREL) 50 MG tablet, Anemia panel  SOB (shortness of breath) on exertion  Class 1 obesity with serious comorbidity and body mass index (BMI) of 30.0 to 30.9 in adult, unspecified obesity type - BMI greater than 30 at start of program   PLAN:  Hyperlipidemia Intensive lifestyle modifications as the first line treatment for hyperlipidemia. We discussed many lifestyle modifications today and Oddie will continue to work on diet, exercise and weight loss efforts. Jaxsyn agrees to continue her medications, and we will check labs today. Sevannah agrees to follow up with our clinic in 2 to 3 weeks.  Vitamin D Deficiency Low vitamin D level contributes to fatigue and are associated with obesity, breast, and colon cancer. Courtney [name] agrees to continue taking prescription Vit D 50,000 IU every 14 days and will follow up for routine testing of vitamin D, at least 2-3 times per year to avoid over-replacement. We will check labs today. Courtney [name] agrees to follow up with our clinic in 2 to 3 weeks.  Coronary Artery Disease We will check labs today.   Osteopenia Courtney [name] is to continue her Vit D  supplementation, and we will continue to monitor.  Insomnia The problem of recurrent insomnia was discussed. Avoidance of caffeine sources was strongly encouraged and sleep hygiene issues were reviewed. Courtney [name] agrees to start Trazodone 50 mg 1/2 tablet PO qhs #30 with no refills. She is to consider low-dose Gabapentin if Trazodone is helpful, but  her leg pain keeps her awake. Courtney [name] agrees to follow up with our clinic in 2 to 3 weeks.  Shortness of Breath with Exertion Courtney [name]'s shortness of breath appears to be obesity related and exercise induced. IC was reviewed today. She has agreed to work on weight loss and gradually increase exercise to treat her exercise induced shortness of breath. Will continue to monitor closely.  Obesity Courtney [name] is currently in the action stage of change. As such, her goal is to continue with weight loss efforts She has agreed to follow the Category 1 plan Courtney [name] has been instructed to work up to a goal of 150 minutes of combined cardio and strengthening exercise per week or as tolerated (recommended physical therapy pilates) for weight loss and overall health benefits. We discussed the following Behavioral Modification Strategies today: increasing lean protein intake, decreasing simple carbohydrates, increasing vegetables, increase H20 intake, work on meal planning and easy cooking plans and celebration eating strategies    Courtney [name] has agreed to follow up with our clinic in 2 to 3 weeks. She was informed of the importance of frequent follow up visits to maximize her success with intensive lifestyle modifications for her multiple health conditions.  ALLERGIES: Allergies  Allergen Reactions  . Erythromycin Hives  . Penicillins Hives    Has patient had a PCN reaction causing immediate rash, facial/tongue/throat swelling, SOB or lightheadedness with hypotension: No Has patient had a PCN reaction causing severe rash involving mucus membranes or skin necrosis: No Has patient had a PCN reaction that required hospitalization: No Has patient had a PCN reaction occurring within the last 10 years: No If all of the above answers are "NO", then may proceed with Cephalosporin use.   [name] Shellfish Allergy Hives    MEDICATIONS: Current Outpatient Medications on File Prior to Visit  Medication Sig Dispense Refill  .  acetaminophen (TYLENOL) 500 MG tablet Take 1,000 mg by mouth every 6 (six) hours as needed (Pain).     [name] aspirin 81 MG tablet Take 81 mg by mouth daily.    [name] ezetimibe (ZETIA) 10 MG tablet Take 1 tablet (10 mg total) by mouth daily. 90 tablet 3  . metoprolol tartrate (LOPRESSOR) 25 MG tablet Take 0.5 tablets (12.5 mg total) by mouth 2 (two) times daily. 90 tablet 3  . nitroGLYCERIN (NITROSTAT) 0.4 MG SL tablet Place 1 tablet (0.4 mg total) under the tongue every 5 (five) minutes as needed for chest pain. 25 tablet 11  . pantoprazole (PROTONIX) 40 MG tablet Take 1 tablet (40 mg total) by mouth daily. 90 tablet 3  . ramipril (ALTACE) 10 MG capsule Take 1 capsule (10 mg total) by mouth daily. 90 capsule 3  . rosuvastatin (CRESTOR) 40 MG tablet Take 1 tablet (40 mg total) by mouth daily. 90 tablet 3  . Vitamin D, Ergocalciferol, (DRISDOL) 1.25 MG (50000 UT) CAPS capsule Take 1 capsule (50,000 Units total) by mouth every 14 (fourteen) days. 2 capsule 0   No current facility-administered medications on file prior to visit.     PAST MEDICAL HISTORY: Past Medical History:  Diagnosis Date  . Aortic valve sclerosis    echo 19/1478, soft systolic murmur  .  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 Back pain   . Carotid artery disease ([place])    40-59% bilateral, doppler December 2010  . Constipation   . Coronary artery disease    a. BMS-mid RCA 11/2007 b. stress echo 02/2009 subtle inferior HK, lateral ST depressions with stress c. follow-up cath 02/2009: RCA stent patent, severe but stable stenosis of distal posterior lateral branch RCA. LV normal, med tx unless more sx c. ETT Myoview 08/27/12: negative for ischemia; EF 64%  . Decreased hearing   . Depression    Mild, August, 2012  . Dyslipidemia   . Easy bruising   . Ejection fraction    EF normal, catheterization, 2011 /  EF 55%, echo, 2010  . Fluid overload    Mild, stable since hospitalization in the past  . GERD (gastroesophageal reflux disease)     occarionally, takes Pepcid over the counter  . Heart murmur    has, occasional PVC's  . History of right hip replacement   . Hyperlipidemia   . Hypertension   . Joint pain   . Myocardial infarction ([place])    2009  . Palpitations   . PVC (premature ventricular contraction)    per prior Holter monitor  . Right hip pain 12/2009  . Shellfish allergy   . Trouble in sleeping   . Vitamin B12 deficiency   . Vitamin D deficiency     PAST SURGICAL HISTORY: Past Surgical History:  Procedure Laterality Date  . 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/01/2005  . carcinoid tumor removal    . CARDIAC CATHETERIZATION  1/211   Patent RCA stent, stenotic PLB supplying small distribution; medically managed  . CORONARY ANGIOPLASTY WITH STENT PLACEMENT  11/2007   BMS-mid RCA  . DIAGNOSTIC LAPAROSCOPY     1982 for endometriosis  . DILATION AND CURETTAGE OF UTERUS  2005  . hx of wisdom teeth surgery    . SKIN CANCER EXCISION    . TOTAL HIP ARTHROPLASTY Right 07/08/2014   Procedure: RIGHT TOTAL HIP ARTHROPLASTY ANTERIOR APPROACH;  Surgeon: Mcarthur Rossetti, MD;  Location: [facility];  Service: Orthopedics;  Laterality: Right;    SOCIAL HISTORY: Social History   Tobacco Use  . Smoking status: Never Smoker  . Smokeless tobacco: Never Used  Substance Use Topics  . Alcohol use: Yes    Alcohol/week: 7.0 standard drinks    Types: 7 Glasses of wine per week    Comment: 1 glass of wine/night  . Drug use: No    FAMILY HISTORY: Family History  Problem Relation Age of Onset  . Heart attack Father 90  . Hyperlipidemia Father   . Hypertension Father   . Sudden death Father   . Stroke Mother   . Hyperlipidemia Mother   . Hypertension Mother   . Heart disease Mother   . Obesity Mother   . Heart attack Paternal Grandfather   . Heart disease Sister   . BRCA 1/2 Neg Hx   . Breast cancer Neg Hx     ROS: Review of Systems  Constitutional: Negative for weight loss.  Respiratory: Positive for shortness of  breath (with exertion).   Cardiovascular: Negative for chest pain, orthopnea and claudication.  Gastrointestinal: Negative for nausea and vomiting.  Musculoskeletal: Negative for myalgias.       Negative muscle weakness  Psychiatric/Behavioral: The patient has insomnia.     PHYSICAL EXAM: Blood pressure (!) 115/54, pulse (!) 51, temperature 98 F (36.7 C), temperature source Oral, height  5' (1.524 m), weight 146 lb (66.2 kg), SpO2 99 %. Body mass index is 28.51 kg/m. Physical Exam Vitals signs reviewed.  Constitutional:      Appearance: Normal appearance. She is obese.  Cardiovascular:     Rate and Rhythm: Normal rate.     Pulses: Normal pulses.  Pulmonary:     Effort: Pulmonary effort is normal.     Breath sounds: Normal breath sounds.  Musculoskeletal: Normal range of motion.  Skin:    General: Skin is warm and dry.  Neurological:     Mental Status: She is alert and oriented to person, place, and time.  Psychiatric:        Mood and Affect: Mood normal.        Behavior: Behavior normal.     RECENT LABS AND TESTS: BMET    Component Value Date/Time   NA WILL FOLLOW 01/25/2019 0853   K WILL FOLLOW 01/25/2019 0853   CL WILL FOLLOW 01/25/2019 0853   CO2 WILL FOLLOW 01/25/2019 0853   GLUCOSE WILL FOLLOW 01/25/2019 0853   GLUCOSE 92 10/14/2017 1923   BUN WILL FOLLOW 01/25/2019 0853   CREATININE WILL FOLLOW 01/25/2019 0853   CALCIUM WILL FOLLOW 01/25/2019 0853   GFRNONAA WILL FOLLOW 01/25/2019 0853   GFRAA WILL FOLLOW 01/25/2019 0853   Lab Results  Component Value Date   HGBA1C 5.4 09/14/2018   HGBA1C 5.6 03/03/2018   HGBA1C 5.6 10/15/2017   HGBA1C 5.6 06/16/2017   Lab Results  Component Value Date   INSULIN 4.2 09/14/2018   INSULIN 5.0 03/03/2018   INSULIN 5.9 10/15/2017   INSULIN 6.4 06/16/2017   CBC    Component Value Date/Time   WBC 7.6 01/25/2019 0853   WBC 12.2 (H) 10/14/2017 1923   RBC 3.91 01/25/2019 0853   RBC 4.21 10/14/2017 1923   HGB 12.3  01/25/2019 0853   HCT 36.7 01/25/2019 0853   PLT 201 01/25/2019 0853   MCV 94 01/25/2019 0853   MCH 31.5 01/25/2019 0853   MCH 31.8 10/14/2017 1923   MCHC 33.5 01/25/2019 0853   MCHC 33.8 10/14/2017 1923   RDW 12.3 01/25/2019 0853   LYMPHSABS 3.1 01/25/2019 0853   MONOABS 0.8 10/14/2017 1923   EOSABS 0.3 01/25/2019 0853   BASOSABS 0.1 01/25/2019 0853   Iron/TIBC/Ferritin/ %Sat    Component Value Date/Time   IRON WILL FOLLOW 01/25/2019 0853   TIBC WILL FOLLOW 01/25/2019 0853   FERRITIN WILL FOLLOW 01/25/2019 0853   IRONPCTSAT WILL FOLLOW 01/25/2019 0853   Lipid Panel     Component Value Date/Time   CHOL WILL FOLLOW 01/25/2019 0853   TRIG WILL FOLLOW 01/25/2019 0853   HDL WILL FOLLOW 01/25/2019 0853   CHOLHDL 2.5 10/29/2016 1030   CHOLHDL 3 11/30/2015 1028   VLDL 25.4 11/30/2015 1028   LDLCALC WILL FOLLOW 01/25/2019 0853   Hepatic Function Panel     Component Value Date/Time   PROT WILL FOLLOW 01/25/2019 0853   ALBUMIN WILL FOLLOW 01/25/2019 0853   AST WILL FOLLOW 01/25/2019 0853   ALT WILL FOLLOW 01/25/2019 0853   ALKPHOS WILL FOLLOW 01/25/2019 0853   BILITOT WILL FOLLOW 01/25/2019 0853   BILIDIR 0.07 10/29/2016 1030      Component Value Date/Time   TSH 0.931 06/16/2017 0000   TSH 1.43 01/09/2015 0806   TSH 0.876 08/26/2012 1722      OBESITY BEHAVIORAL INTERVENTION VISIT  Today's visit was # 26   Starting weight: 157 lbs Starting date: 06/16/17 Today's weight :  146 lbs  Today's date: 01/25/2019 Total lbs lost to date: 11 At least 15 minutes were spent on discussing the following behavioral intervention visit.   ASK: We discussed the diagnosis of obesity with [name] today and Collin agreed to give [place] permission to discuss obesity behavioral modification therapy today.  ASSESS: Jalayla has the diagnosis of obesity and her BMI today is 28.51 Tenita is in the action stage of change   ADVISE: Kaiya was educated on the multiple health risks of  obesity as well as the benefit of weight loss to improve her health. She was advised of the need for long term treatment and the importance of lifestyle modifications to improve her current health and to decrease her risk of future health problems.  AGREE: Multiple dietary modification options and treatment options were discussed and  Aria agreed to follow the recommendations documented in the above note.  ARRANGE: Ryan was educated on the importance of frequent visits to treat obesity as outlined per CMS and USPSTF guidelines and agreed to schedule her next follow up appointment today.  Wilhemena Durie, am acting as transcriptionist for Briscoe Deutscher, DO  I have reviewed the above documentation for accuracy and completeness, and I agree with the above. Briscoe Deutscher, DO

## 2019-01-27 LAB — CBC WITH DIFFERENTIAL/PLATELET
Basophils Absolute: 0.1 10*3/uL (ref 0.0–0.2)
Basos: 1 %
EOS (ABSOLUTE): 0.3 10*3/uL (ref 0.0–0.4)
Eos: 4 %
Hemoglobin: 12.3 g/dL (ref 11.1–15.9)
Immature Grans (Abs): 0 10*3/uL (ref 0.0–0.1)
Immature Granulocytes: 0 %
Lymphocytes Absolute: 3.1 10*3/uL (ref 0.7–3.1)
Lymphs: 41 %
MCH: 31.5 pg (ref 26.6–33.0)
MCHC: 33.5 g/dL (ref 31.5–35.7)
MCV: 94 fL (ref 79–97)
Monocytes Absolute: 0.4 10*3/uL (ref 0.1–0.9)
Monocytes: 6 %
Neutrophils Absolute: 3.6 10*3/uL (ref 1.4–7.0)
Neutrophils: 48 %
Platelets: 201 10*3/uL (ref 150–450)
RBC: 3.91 x10E6/uL (ref 3.77–5.28)
RDW: 12.3 % (ref 11.7–15.4)
WBC: 7.6 10*3/uL (ref 3.4–10.8)

## 2019-01-27 LAB — COMPREHENSIVE METABOLIC PANEL
ALT: 20 IU/L (ref 0–32)
AST: 21 IU/L (ref 0–40)
Albumin/Globulin Ratio: 2.4 — ABNORMAL HIGH (ref 1.2–2.2)
Albumin: 4.7 g/dL (ref 3.8–4.8)
Alkaline Phosphatase: 66 IU/L (ref 39–117)
BUN/Creatinine Ratio: 29 — ABNORMAL HIGH (ref 12–28)
BUN: 21 mg/dL (ref 8–27)
Bilirubin Total: 0.3 mg/dL (ref 0.0–1.2)
CO2: 22 mmol/L (ref 20–29)
Calcium: 9.7 mg/dL (ref 8.7–10.3)
Chloride: 105 mmol/L (ref 96–106)
Creatinine, Ser: 0.72 mg/dL (ref 0.57–1.00)
GFR calc Af Amer: 98 mL/min/{1.73_m2} (ref >59)
GFR calc non Af Amer: 85 mL/min/{1.73_m2} (ref >59)
Globulin, Total: 2 g/dL (ref 1.5–4.5)
Glucose: 104 mg/dL — ABNORMAL HIGH (ref 65–99)
Potassium: 4.6 mmol/L (ref 3.5–5.2)
Sodium: 141 mmol/L (ref 134–144)
Total Protein: 6.7 g/dL (ref 6.0–8.5)

## 2019-01-27 LAB — ANEMIA PANEL
Ferritin: 54 ng/mL (ref 15–150)
Folate, Hemolysate: 362 ng/mL
Folate, RBC: 986 ng/mL (ref >498)
Hematocrit: 36.7 % (ref 34.0–46.6)
Iron Saturation: 30 % (ref 15–55)
Iron: 100 ug/dL (ref 27–139)
Retic Ct Pct: 1.3 % (ref 0.6–2.6)
Total Iron Binding Capacity: 328 ug/dL (ref 250–450)
UIBC: 228 ug/dL (ref 118–369)
Vitamin B-12: 300 pg/mL (ref 232–1245)

## 2019-01-27 LAB — VITAMIN D 25 HYDROXY (VIT D DEFICIENCY, FRACTURES): Vit D, 25-Hydroxy: 43.2 ng/mL (ref 30.0–100.0)

## 2019-01-27 LAB — LIPID PANEL WITH LDL/HDL RATIO
Cholesterol, Total: 151 mg/dL (ref 100–199)
HDL: 66 mg/dL (ref >39)
LDL Chol Calc (NIH): 72 mg/dL (ref 0–99)
LDL/HDL Ratio: 1.1 ratio (ref 0.0–3.2)
Triglycerides: 63 mg/dL (ref 0–149)
VLDL Cholesterol Cal: 13 mg/dL (ref 5–40)

## 2019-01-28 DIAGNOSIS — H2512 Age-related nuclear cataract, left eye: Secondary | ICD-10-CM | POA: Diagnosis not present

## 2019-01-28 DIAGNOSIS — H25812 Combined forms of age-related cataract, left eye: Secondary | ICD-10-CM | POA: Diagnosis not present

## 2019-02-08 ENCOUNTER — Other Ambulatory Visit: Payer: Self-pay

## 2019-02-08 ENCOUNTER — Ambulatory Visit (INDEPENDENT_AMBULATORY_CARE_PROVIDER_SITE_OTHER): Payer: Medicare HMO | Admitting: Family Medicine

## 2019-02-08 ENCOUNTER — Encounter (INDEPENDENT_AMBULATORY_CARE_PROVIDER_SITE_OTHER): Payer: Self-pay | Admitting: Family Medicine

## 2019-02-08 VITALS — BP 130/71 | HR 46 | Temp 97.8°F | Ht 60.0 in | Wt 144.0 lb

## 2019-02-08 DIAGNOSIS — E782 Mixed hyperlipidemia: Secondary | ICD-10-CM

## 2019-02-08 DIAGNOSIS — E559 Vitamin D deficiency, unspecified: Secondary | ICD-10-CM | POA: Diagnosis not present

## 2019-02-08 DIAGNOSIS — M8580 Other specified disorders of bone density and structure, unspecified site: Secondary | ICD-10-CM

## 2019-02-08 DIAGNOSIS — E669 Obesity, unspecified: Secondary | ICD-10-CM

## 2019-02-08 DIAGNOSIS — G4709 Other insomnia: Secondary | ICD-10-CM

## 2019-02-08 DIAGNOSIS — Z6830 Body mass index (BMI) 30.0-30.9, adult: Secondary | ICD-10-CM

## 2019-02-08 MED ORDER — VITAMIN D (ERGOCALCIFEROL) 1.25 MG (50000 UNIT) PO CAPS: 50000.0000 [IU] | ORAL_CAPSULE | ORAL | 0 refills | Status: DC

## 2019-02-10 NOTE — Progress Notes (Signed)
Office: [phone]  /  Fax: [phone]   HPI:  Chief Complaint: OBESITY Courtney [name] is here to discuss her progress with her obesity treatment plan. She is on the Category 1 plan and states she is following her eating plan approximately 90 % of the time. She states she is walking for 45 minutes 2 times per week.  Courtney [name] did well over the past few weeks. She really focused on adhering to her diet. She denies polyphagia.  Today's visit was # 27  Starting weight: 157 lbs Starting date: 06/16/17 Today's weight : 144 lbs Today's date: 02/08/2019 Total lbs lost to date: 13 Total lbs lost since last in-office visit: 2  Vitamin D Deficiency Courtney [name] has a diagnosis of vitamin D deficiency. She is taking Vit D every 2 weeks. Her level has dropped significantly since her last lab check, so we will increase Vit D to weekly.  Hyperlipidemia (Mixed) Courtney [name] has a diagnosis of hyperlipidemia. She is taking Crestor and Zetia, and denies myalgias. Last lipid panel was at goal.  Lab Results  Component Value Date   CHOL 151 01/25/2019   HDL 66 01/25/2019   LDLCALC 72 01/25/2019   TRIG 63 01/25/2019   CHOLHDL 2.5 10/29/2016   Lab Results  Component Value Date   ALT 20 01/25/2019   AST 21 01/25/2019   ALKPHOS 66 01/25/2019   BILITOT 0.3 01/25/2019   The 10-year ASCVD risk score [name] DC Jr., et al., 2013) is: 11.6%   Values used to calculate the score:     Age: 67 years     Sex: Female     Is Non-Hispanic African American: No     Diabetic: No     Tobacco smoker: No     Systolic Blood Pressure: 989 mmHg     Is BP treated: Yes     HDL Cholesterol: 66 mg/dL     Total Cholesterol: 151 mg/dL  Insomnia Courtney [name] complains of insomnia. She  trialed Trazodone 50 mg at bedtime, and she notes it helps with keeping her asleep through the night. She notes a little morning grogginess.  Osteopenia Courtney [name] has osteopenia, and she is taking Vit D. Strength training is recommended.  ASSESSMENT AND  PLAN:  Vitamin D deficiency, Vit D = 43.2 (01/25/19) - Plan: Vitamin D, Ergocalciferol, (DRISDOL) 1.25 MG (50000 UT) CAPS capsule  Mixed hyperlipidemia, taking Crestor and Zetia  Other insomnia - Started Trazodone at the last visit.   Osteopenia, unspecified location  Class 1 obesity with serious comorbidity and body mass index (BMI) of 30.0 to 30.9 in adult, unspecified obesity type  PLAN:  Vitamin D Deficiency Low vitamin D level contributes to fatigue and are associated with obesity, breast, and colon cancer. Courtney [name] agrees to increase prescription Vit D 50,000 IU to every week #4 with no refills. She will follow up for routine testing of vitamin D, at least 2-3 times per year to avoid over-replacement. We will continue to monitor.  Hyperlipidemia (Mixed) Intensive lifestyle modifications as the first line treatment for hyperlipidemia. We discussed many lifestyle modifications today and Courtney [name] will continue to work on diet, exercise and weight loss efforts. She will continue her current treatment and we will continue to monitor.  Insomnia The problem of recurrent insomnia was discussed. Avoidance of caffeine was strongly encouraged and sleep hygiene issues were reviewed. Courtney [name] agrees to decrease Trazodone to 25 mg PO at bedtime, and will continue to monitor.  Osteopenia We will continue to monitor.  Obesity Courtney [name] is currently  in the action stage of change. As such, her goal is to continue with weight loss efforts. She has agreed to follow the Category 1 plan. Courtney [name] has been instructed to work up to a goal of 150 minutes of combined cardio and strengthening exercise per week for weight loss and overall health benefits. We discussed the following Behavioral Modification Strategies today: work on meal planning and easy cooking plans, increase H20 intake, and travel eating strategies .  Courtney [name] has agreed to follow up with our clinic in 2 weeks. She was informed of the importance of  frequent follow up visits to maximize her success with intensive lifestyle modifications for her multiple health conditions.  ALLERGIES: Allergies  Allergen Reactions  . Erythromycin Hives  . Penicillins Hives    Has patient had a PCN reaction causing immediate rash, facial/tongue/throat swelling, SOB or lightheadedness with hypotension: No Has patient had a PCN reaction causing severe rash involving mucus membranes or skin necrosis: No Has patient had a PCN reaction that required hospitalization: No Has patient had a PCN reaction occurring within the last 10 years: No If all of the above answers are "NO", then may proceed with Cephalosporin use.   [name] Shellfish Allergy Hives    MEDICATIONS: Current Outpatient Medications on File Prior to Visit  Medication Sig Dispense Refill  . acetaminophen (TYLENOL) 500 MG tablet Take 1,000 mg by mouth every 6 (six) hours as needed (Pain).     [name] aspirin 81 MG tablet Take 81 mg by mouth daily.    [name] ezetimibe (ZETIA) 10 MG tablet Take 1 tablet (10 mg total) by mouth daily. 90 tablet 3  . metoprolol tartrate (LOPRESSOR) 25 MG tablet Take 0.5 tablets (12.5 mg total) by mouth 2 (two) times daily. 90 tablet 3  . nitroGLYCERIN (NITROSTAT) 0.4 MG SL tablet Place 1 tablet (0.4 mg total) under the tongue every 5 (five) minutes as needed for chest pain. 25 tablet 11  . pantoprazole (PROTONIX) 40 MG tablet Take 1 tablet (40 mg total) by mouth daily. 90 tablet 3  . ramipril (ALTACE) 10 MG capsule Take 1 capsule (10 mg total) by mouth daily. 90 capsule 3  . rosuvastatin (CRESTOR) 40 MG tablet Take 1 tablet (40 mg total) by mouth daily. 90 tablet 3  . traZODone (DESYREL) 50 MG tablet Take 0.5-1 tablets (25-50 mg total) by mouth at bedtime. 30 tablet 0   No current facility-administered medications on file prior to visit.    PAST MEDICAL HISTORY: Past Medical History:  Diagnosis Date  . Aortic valve sclerosis    echo 53/2023, soft systolic murmur  . Arthritis     in right hip  . Back pain   . Carotid artery disease ([place])    40-59% bilateral, doppler December 2010  . Constipation   . Coronary artery disease    a. BMS-mid RCA 11/2007 b. stress echo 02/2009 subtle inferior HK, lateral ST depressions with stress c. follow-up cath 02/2009: RCA stent patent, severe but stable stenosis of distal posterior lateral branch RCA. LV normal, med tx unless more sx c. ETT Myoview 08/27/12: negative for ischemia; EF 64%  . Decreased hearing   . Depression    Mild, August, 2012  . Dyslipidemia   . Easy bruising   . Ejection fraction    EF normal, catheterization, 2011 /  EF 55%, echo, 2010  . Fluid overload    Mild, stable since hospitalization in the past  . GERD (gastroesophageal reflux disease)    occarionally,  takes Pepcid over the counter  . Heart murmur    has, occasional PVC's  . History of right hip replacement   . Hyperlipidemia   . Hypertension   . Joint pain   . Myocardial infarction ([place])    2009  . Palpitations   . PVC (premature ventricular contraction)    per prior Holter monitor  . Right hip pain 12/2009  . Shellfish allergy   . Trouble in sleeping   . Vitamin B12 deficiency   . Vitamin D deficiency     PAST SURGICAL HISTORY: Past Surgical History:  Procedure Laterality Date  . 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/01/2005  . carcinoid tumor removal    . CARDIAC CATHETERIZATION  1/211   Patent RCA stent, stenotic PLB supplying small distribution; medically managed  . CORONARY ANGIOPLASTY WITH STENT PLACEMENT  11/2007   BMS-mid RCA  . DIAGNOSTIC LAPAROSCOPY     1982 for endometriosis  . DILATION AND CURETTAGE OF UTERUS  2005  . hx of wisdom teeth surgery    . SKIN CANCER EXCISION    . TOTAL HIP ARTHROPLASTY Right 07/08/2014   Procedure: RIGHT TOTAL HIP ARTHROPLASTY ANTERIOR APPROACH;  Surgeon: Mcarthur Rossetti, MD;  Location: [facility];  Service: Orthopedics;  Laterality: Right;    SOCIAL HISTORY: Social History   Tobacco Use  .  Smoking status: Never Smoker  . Smokeless tobacco: Never Used  Substance Use Topics  . Alcohol use: Yes    Alcohol/week: 7.0 standard drinks    Types: 7 Glasses of wine per week    Comment: 1 glass of wine/night  . Drug use: No    FAMILY HISTORY: Family History  Problem Relation Age of Onset  . Heart attack Father 76  . Hyperlipidemia Father   . Hypertension Father   . Sudden death Father   . Stroke Mother   . Hyperlipidemia Mother   . Hypertension Mother   . Heart disease Mother   . Obesity Mother   . Heart attack Paternal Grandfather   . Heart disease Sister   . BRCA 1/2 Neg Hx   . Breast cancer Neg Hx     ROS: Review of Systems  Constitutional: Positive for weight loss.  Musculoskeletal: Negative for myalgias.  Endo/Heme/Allergies:       Negative polyphagia  Psychiatric/Behavioral: The patient has insomnia.    PHYSICAL EXAM: Blood pressure 130/71, pulse (!) 46, temperature 97.8 F (36.6 C), temperature source Oral, height 5' (1.524 m), weight 144 lb (65.3 kg), SpO2 99 %. Body mass index is 28.12 kg/m.  General: Cooperative, alert, well developed, in no acute distress. HEENT: Conjunctivae and lids unremarkable. Neck: No thyromegaly.  Cardiovascular: Regular rhythm.  Lungs: Normal work of breathing. Extremities: No edema.  Neurologic: No focal deficits.   RECENT LABS AND TESTS: BMET    Component Value Date/Time   NA 141 01/25/2019 0853   K 4.6 01/25/2019 0853   CL 105 01/25/2019 0853   CO2 22 01/25/2019 0853   GLUCOSE 104 (H) 01/25/2019 0853   GLUCOSE 92 10/14/2017 1923   BUN 21 01/25/2019 0853   CREATININE 0.72 01/25/2019 0853   CALCIUM 9.7 01/25/2019 0853   GFRNONAA 85 01/25/2019 0853   GFRAA 98 01/25/2019 0853   Lab Results  Component Value Date   HGBA1C 5.4 09/14/2018   HGBA1C 5.6 03/03/2018   HGBA1C 5.6 10/15/2017   HGBA1C 5.6 06/16/2017   Lab Results  Component Value Date   INSULIN 4.2 09/14/2018   INSULIN 5.0 03/03/2018  INSULIN  5.9 10/15/2017   INSULIN 6.4 06/16/2017   CBC    Component Value Date/Time   WBC 7.6 01/25/2019 0853   WBC 12.2 (H) 10/14/2017 1923   RBC 3.91 01/25/2019 0853   RBC 4.21 10/14/2017 1923   HGB 12.3 01/25/2019 0853   HCT 36.7 01/25/2019 0853   PLT 201 01/25/2019 0853   MCV 94 01/25/2019 0853   MCH 31.5 01/25/2019 0853   MCH 31.8 10/14/2017 1923   MCHC 33.5 01/25/2019 0853   MCHC 33.8 10/14/2017 1923   RDW 12.3 01/25/2019 0853   LYMPHSABS 3.1 01/25/2019 0853   MONOABS 0.8 10/14/2017 1923   EOSABS 0.3 01/25/2019 0853   BASOSABS 0.1 01/25/2019 0853   Iron/TIBC/Ferritin/ %Sat    Component Value Date/Time   IRON 100 01/25/2019 0853   TIBC 328 01/25/2019 0853   FERRITIN 54 01/25/2019 0853   IRONPCTSAT 30 01/25/2019 0853   Lipid Panel     Component Value Date/Time   CHOL 151 01/25/2019 0853   TRIG 63 01/25/2019 0853   HDL 66 01/25/2019 0853   CHOLHDL 2.5 10/29/2016 1030   CHOLHDL 3 11/30/2015 1028   VLDL 25.4 11/30/2015 1028   LDLCALC 72 01/25/2019 0853   Hepatic Function Panel     Component Value Date/Time   PROT 6.7 01/25/2019 0853   ALBUMIN 4.7 01/25/2019 0853   AST 21 01/25/2019 0853   ALT 20 01/25/2019 0853   ALKPHOS 66 01/25/2019 0853   BILITOT 0.3 01/25/2019 0853   BILIDIR 0.07 10/29/2016 1030      Component Value Date/Time   TSH 0.931 06/16/2017 0000   TSH 1.43 01/09/2015 0806   TSH 0.876 08/26/2012 1722    OBESITY BEHAVIORAL INTERVENTION VISIT DOCUMENTATION FOR INSURANCE (~15 minutes)   ASK: We discussed the diagnosis of obesity with Courtney [name] today and Courtney [name] agreed to give [place] permission to discuss obesity behavioral modification therapy today.  ASSESS: Maytal has the diagnosis of obesity and her BMI today is 28.12. Courtney [name] is in the action stage of change. [name] ADVISE: Courtney [name] was educated on the multiple health risks of obesity as well as the benefit of weight loss to improve her health. She was advised of the need for long term treatment and the  importance of lifestyle modifications to improve her current health and to decrease her risk of future health problems.  AGREE: Multiple dietary modification options and treatment options were discussed and  Courtney [name] agreed to follow the recommendations documented in the above note.  ARRANGE: Courtney [name] was educated on the importance of frequent visits to treat obesity as outlined per CMS and USPSTF guidelines and agreed to schedule her next follow up appointment today.   Wilhemena Durie, am acting as transcriptionist for Briscoe Deutscher, DO  I have reviewed the above documentation for accuracy and completeness, and I agree with the above. Briscoe Deutscher, DO

## 2019-02-11 ENCOUNTER — Encounter (INDEPENDENT_AMBULATORY_CARE_PROVIDER_SITE_OTHER): Payer: Self-pay | Admitting: Family Medicine

## 2019-02-23 ENCOUNTER — Encounter (INDEPENDENT_AMBULATORY_CARE_PROVIDER_SITE_OTHER): Payer: Self-pay | Admitting: Family Medicine

## 2019-02-23 ENCOUNTER — Ambulatory Visit (INDEPENDENT_AMBULATORY_CARE_PROVIDER_SITE_OTHER): Payer: Medicare HMO | Admitting: Family Medicine

## 2019-02-23 ENCOUNTER — Other Ambulatory Visit: Payer: Self-pay

## 2019-02-23 VITALS — BP 131/63 | HR 55 | Temp 97.8°F | Ht 60.0 in | Wt 146.0 lb

## 2019-02-23 DIAGNOSIS — Z6830 Body mass index (BMI) 30.0-30.9, adult: Secondary | ICD-10-CM | POA: Diagnosis not present

## 2019-02-23 DIAGNOSIS — K588 Other irritable bowel syndrome: Secondary | ICD-10-CM

## 2019-02-23 DIAGNOSIS — G479 Sleep disorder, unspecified: Secondary | ICD-10-CM | POA: Diagnosis not present

## 2019-02-23 DIAGNOSIS — E559 Vitamin D deficiency, unspecified: Secondary | ICD-10-CM | POA: Diagnosis not present

## 2019-02-23 DIAGNOSIS — I25119 Atherosclerotic heart disease of native coronary artery with unspecified angina pectoris: Secondary | ICD-10-CM | POA: Diagnosis not present

## 2019-02-23 DIAGNOSIS — R1013 Epigastric pain: Secondary | ICD-10-CM | POA: Diagnosis not present

## 2019-02-23 DIAGNOSIS — E782 Mixed hyperlipidemia: Secondary | ICD-10-CM | POA: Diagnosis not present

## 2019-02-23 DIAGNOSIS — E669 Obesity, unspecified: Secondary | ICD-10-CM | POA: Diagnosis not present

## 2019-02-23 MED ORDER — DICYCLOMINE HCL 20 MG PO TABS: 20.0000 mg | ORAL_TABLET | Freq: Four times a day (QID) | ORAL | 1 refills | Status: DC

## 2019-02-23 MED ORDER — DEXLANSOPRAZOLE 30 MG PO CPDR: 30.0000 mg | DELAYED_RELEASE_CAPSULE | Freq: Every day | ORAL | 0 refills | Status: DC

## 2019-02-23 NOTE — Progress Notes (Deleted)
Chief Complaint: Courtney [name] is here to discuss her progress with her obesity treatment plan. Courtney [name] is on the {MWMwtlossportion/plan2:23431} and states she is following her eating plan approximately ***% of the time. Courtney [name] states she is *** *** minutes *** times per week.  Today's visit was #: *** Starting weight: *** Starting date: *** Today's weight: *** Today's date: 02/23/2019 Total lbs lost to date: *** Total lbs lost since last in-office visit: ***  Subjective:   Interim History: ***  General review of systems is unchanged or negative.   Assessment/Plan:   1. Coronary artery disease with angina pectoris, unspecified vessel or lesion type, unspecified whether native or transplanted heart (HCC) ***  2. Vitamin D deficiency Low Vitamin D level contributes to fatigue and are associated with obesity, breast, and colon cancer. She agrees to continue to take prescription Vitamin D @50 ,000 IU every week and will follow-up for routine testing of vitamin D, at least 2-3 times per year to avoid over-replacement.  3. Mixed hyperlipidemia ***  4. Epigastric abdominal pain *** - Ambulatory referral to Gastroenterology - Dexlansoprazole 30 MG capsule; Take 1 capsule (30 mg total) by mouth daily.  Dispense: 30 capsule; Refill: 0 - dicyclomine (BENTYL) 20 MG tablet; Take 1 tablet (20 mg total) by mouth every 6 (six) hours.  Dispense: 120 tablet; Refill: 1  5. Other irritable bowel syndrome ***  6. Sleep disorder ***  7. Class 1 obesity with serious comorbidity and body mass index (BMI) of 30.0 to 30.9 in adult, unspecified obesity type Courtney [name] {CHL AMB IS/IS NOT:[id number]} currently in the action stage of change. As such, her goal is to {MWMwtloss#1:210800005}. She has agreed to {MWMwtlossportion/plan2:23431}.   Patient has the following exercise goals: Older adults should follow the adult guidelines. When older adults cannot meet the adult guidelines, they should be as  physically active as their abilities and conditions will allow.  Older adults should do exercises that maintain or improve balance if they are at risk of falling.   We discussed the following behavioral modification strategies today: {MWMwtlossdietstrategies3:23432}.  Courtney [name] has agreed to follow-up with our clinic in {NUMBER 1-10:22536} weeks. She was informed of the importance of frequent follow-up visits to maximize her success with intensive lifestyle modifications for her multiple health conditions.  Objective:   Blood pressure 131/63, pulse (!) 55, temperature 97.8 F (36.6 C), temperature source Oral, height 5' (1.524 m), weight 146 lb (66.2 kg), SpO2 98 %. Body mass index is 28.51 kg/m.  General: Cooperative, alert, well developed, in no acute distress. HEENT: Conjunctivae and lids unremarkable. Neck: No thyromegaly.  Cardiovascular: Regular rhythm.  Lungs: Normal work of breathing. Extremities: No edema.  Neurologic: No focal deficits.   Lab Results  Component Value Date   CREATININE 0.72 01/25/2019   BUN 21 01/25/2019   NA 141 01/25/2019   K 4.6 01/25/2019   CL 105 01/25/2019   CO2 22 01/25/2019   Lab Results  Component Value Date   ALT 20 01/25/2019   AST 21 01/25/2019   ALKPHOS 66 01/25/2019   BILITOT 0.3 01/25/2019   Lab Results  Component Value Date   HGBA1C 5.4 09/14/2018   HGBA1C 5.6 03/03/2018   HGBA1C 5.6 10/15/2017   HGBA1C 5.6 06/16/2017   Lab Results  Component Value Date   INSULIN 4.2 09/14/2018   INSULIN 5.0 03/03/2018   INSULIN 5.9 10/15/2017   INSULIN 6.4 06/16/2017   Lab Results  Component Value Date  TSH 0.931 06/16/2017   Lab Results  Component Value Date   CHOL 151 01/25/2019   HDL 66 01/25/2019   LDLCALC 72 01/25/2019   TRIG 63 01/25/2019   CHOLHDL 2.5 10/29/2016   Lab Results  Component Value Date   WBC 7.6 01/25/2019   HGB 12.3 01/25/2019   HCT 36.7 01/25/2019   MCV 94 01/25/2019   PLT 201 01/25/2019   Lab  Results  Component Value Date   IRON 100 01/25/2019   TIBC 328 01/25/2019   FERRITIN 54 01/25/2019   Obesity Behavioral Intervention Visit Documentation for Insurance (15 Minutes):   ASK: We discussed the diagnosis of obesity with Courtney [name] today and Courtney [name] agreed to give [place] permission to discuss obesity behavioral modification therapy today.  ASSESS: Courtney [name] has the diagnosis of obesity and her BMI today is ***. Courtney [name] {ACTION; IS/IS GI:[id number] in the action stage of change.   ADVISE: Courtney [name] was educated on the multiple health risks of obesity as well as the benefit of weight loss to improve her health. She was advised of the need for long term treatment and the importance of lifestyle modifications to improve her current health and to decrease her risk of future health problems.  AGREE: Multiple dietary modification options and treatment options were discussed and Courtney [name] agreed to follow the recommendations documented in the above note.  ARRANGE: Courtney [name] was educated on the importance of frequent visits to treat obesity as outlined per CMS and USPSTF guidelines and agreed to schedule her next follow up appointment today.  Attestation Statements:   Reviewed by clinician on day of visit: allergies, medications, problem list, medical history, surgical history, family history, social history and previous encounter notes.  This visit occurred during the SARS-CoV-2 public health emergency. Safety protocols were in place, including screening questions prior to the visit, additional usage of staff PPE, and extensive cleaning of exam room while observing appropriate contact time as indicated for disinfecting solutions. (CPT [id number])  I, ***, am acting as transcriptionist for ***.  I have reviewed the above documentation for accuracy and completeness, and I agree with the above. Briscoe Deutscher, DO

## 2019-02-25 ENCOUNTER — Ambulatory Visit (INDEPENDENT_AMBULATORY_CARE_PROVIDER_SITE_OTHER): Payer: Medicare HMO | Admitting: Physician Assistant

## 2019-02-26 NOTE — Progress Notes (Signed)
Chief Complaint: Courtney [name] is here to discuss her progress with her obesity treatment plan along with follow-up of her obesity related diagnoses. Courtney [name] is on the Category 1 Plan and states she is following her eating plan approximately 85% of the time. Courtney [name] states she is exercising 0 minutes 0 times per week.  Today's visit was #: 28 Starting weight: 157 lbs  Starting date: 06/16/2017 Today's weight: 146 lbs Today's date: 02/26/2019 Total lbs lost to date: 11 lbs Total lbs lost since last in-office visit: 0  Interim History: Courtney [name] states she ate "bad" foods around Christmas.  She complains of a gnawing epigastric pain, without radiation, that lasted 24 hours.  She says it usually lasts about a week.  No vomiting or constipation.  She is taking Protonix and Bentyl.  She has longstanding GERD.  She has not had an EGD.  She sees [facility].   Subjective:   1. Coronary artery disease with angina pectoris, unspecified vessel or lesion type, unspecified whether native or transplanted heart [facility]) Courtney [name] has history of MI.  BP today is 131/63.  No chest pain, shortness of breath, or edema.  2. Vitamin D deficiency [place]'s Vitamin D level was 43.2 on 01/25/2019. She is currently taking vit D. She denies nausea, vomiting or muscle weakness.    3. Mixed hyperlipidemia Courtney [name] has hyperlipidemia and has been trying to improve her cholesterol levels with intensive lifestyle modification including a low saturated fat diet, exercise and weight loss. She denies any chest pain, claudication or myalgias.  Lab Results  Component Value Date   ALT 20 01/25/2019   AST 21 01/25/2019   ALKPHOS 66 01/25/2019   BILITOT 0.3 01/25/2019   Lab Results  Component Value Date   CHOL 151 01/25/2019   HDL 66 01/25/2019   LDLCALC 72 01/25/2019   TRIG 63 01/25/2019   CHOLHDL 2.5 10/29/2016   4. Epigastric abdominal pain Courtney [name] has not had an EGD.  5. Other irritable bowel syndrome Courtney [name] has  IBS.  6. Sleep disorder The patient has history of insomnia and RLS.  She has tried multiple medications and CBT.  Assessment/Plan:   1. Coronary artery disease with angina pectoris, unspecified vessel or lesion type, unspecified whether native or transplanted heart [facility]) Followed by Dr. Martinique with Cardiology. 10/07/18 document reviewed. Orders and follow up as documented in patient record.  Ordell will continue to work on [employer].  2. Vitamin D deficiency Courtney [name] was previously prescribed vitamin D every 2 weeks until the last visit when her vitamin D level was found to have dropped.  She will now go back to weekly dosing.  Low Vitamin D level contributes to fatigue and are associated with obesity, breast, and colon cancer. She agrees to continue to take prescription Vitamin D @50 ,000 IU every week and will follow-up for routine testing of vitamin D, at least 2-3 times per year to avoid over-replacement.  3. Mixed hyperlipidemia Cardiovascular risk and specific lipid/LDL goals reviewed.  We discussed several lifestyle modifications today and Courtney [name] will continue to work on diet, exercise and weight loss efforts. Orders and follow up as documented in patient record.   Counseling Intensive lifestyle modifications are the first line treatment for this issue. . Dietary changes: Increase soluble fiber. Decrease simple carbohydrates. . Exercise changes: Moderate to vigorous-intensity aerobic activity 150 minutes per week if tolerated. . Lipid-lowering medications: see documented in medical record.  4. Epigastric abdominal pain Worsening. Patient describes epigastric gnawing pain  with no radiation. Gastrointestinal ROS: negative for - blood in stools, hematemesis, melena or swallowing difficulty/pain. Will advance PPI and ask for GI referral to see if EGD needed.   - Ambulatory referral to Gastroenterology - Dexlansoprazole 30 MG capsule; Take 1 capsule (30 mg total) by mouth daily.   Dispense: 30 capsule; Refill: 0  5. Other irritable bowel syndrome Worsening. As above, will refer to GI.  - dicyclomine (BENTYL) 20 MG tablet; Take 1 tablet (20 mg total) by mouth every 6 (six) hours.  Dispense: 120 tablet; Refill: 1  6. Sleep disorder Courtney [name] will stop taking Trazodone as it caused her to have nightmares.  The problem of recurrent insomnia was discussed. Orders and follow up as documented in patient record. Counseling: Intensive lifestyle modifications are the first line treatment for this issue. We discussed several lifestyle modifications today and she will continue to work on diet, exercise and weight loss efforts.   Counseling  Limit or avoid alcohol, caffeinated beverages, and cigarettes, especially close to bedtime.   Do not eat a large meal or eat spicy foods right before bedtime. This can lead to digestive discomfort that can make it hard for you to sleep.  Keep a sleep diary to help you and your health care provider figure out what could be causing your insomnia.  . Make your bedroom a dark, comfortable place where it is easy to fall asleep. ? Put up shades or blackout curtains to block light from outside. ? Use a white noise machine to block noise. ? Keep the temperature cool. . Limit screen use before bedtime. This includes: ? Watching TV. ? Using your smartphone, tablet, or computer. . Stick to a routine that includes going to bed and waking up at the same times every day and night. This can help you fall asleep faster. Consider making a quiet activity, such as reading, part of your nighttime routine. . Try to avoid taking naps during the day so that you sleep better at night. . Get out of bed if you are still awake after 15 minutes of trying to sleep. Keep the lights down, but try reading or doing a quiet activity. When you feel sleepy, go back to bed.  7. Class 1 obesity with serious comorbidity and body mass index (BMI) of 30.0 to 30.9 in adult, unspecified  obesity type Courtney [name] is currently in the action stage of change. As such, her goal is to continue with weight loss efforts. She has agreed to on the Category 1 Plan.   We discussed the following exercise goals today: Older adults should follow the adult guidelines. When older adults cannot meet the adult guidelines, they should be as physically active as their abilities and conditions will allow.  Older adults should do exercises that maintain or improve balance if they are at risk of falling.   We discussed the following behavioral modification strategies today: increasing water intake.  Nadina should make exercise a priority three times per week and decrease her diet soda intake.  GENEVRA SABET has agreed to follow-up with our clinic in 2 weeks. She was informed of the importance of frequent follow-up visits to maximize her success with intensive lifestyle modifications for her multiple health conditions.  Objective:   Blood pressure 131/63, pulse (!) 55, temperature 97.8 F (36.6 C), temperature source Oral, height 5' (1.524 m), weight 146 lb (66.2 kg), SpO2 98 %. Body mass index is 28.51 kg/m.  General: Cooperative, alert, well developed, in no acute distress.  HEENT: Conjunctivae and lids unremarkable. Neck: No thyromegaly.  Cardiovascular: Regular rhythm.  Lungs: Normal work of breathing. Extremities: No edema.  Neurologic: No focal deficits.   Lab Results  Component Value Date   CREATININE 0.72 01/25/2019   BUN 21 01/25/2019   NA 141 01/25/2019   K 4.6 01/25/2019   CL 105 01/25/2019   CO2 22 01/25/2019   Lab Results  Component Value Date   ALT 20 01/25/2019   AST 21 01/25/2019   ALKPHOS 66 01/25/2019   BILITOT 0.3 01/25/2019   Lab Results  Component Value Date   HGBA1C 5.4 09/14/2018   HGBA1C 5.6 03/03/2018   HGBA1C 5.6 10/15/2017   HGBA1C 5.6 06/16/2017   Lab Results  Component Value Date   INSULIN 4.2 09/14/2018   INSULIN 5.0 03/03/2018   INSULIN 5.9 10/15/2017    INSULIN 6.4 06/16/2017   Lab Results  Component Value Date   TSH 0.931 06/16/2017   Lab Results  Component Value Date   CHOL 151 01/25/2019   HDL 66 01/25/2019   LDLCALC 72 01/25/2019   TRIG 63 01/25/2019   CHOLHDL 2.5 10/29/2016   Lab Results  Component Value Date   WBC 7.6 01/25/2019   HGB 12.3 01/25/2019   HCT 36.7 01/25/2019   MCV 94 01/25/2019   PLT 201 01/25/2019   Lab Results  Component Value Date   IRON 100 01/25/2019   TIBC 328 01/25/2019   FERRITIN 54 01/25/2019   Obesity Behavioral Intervention Visit Documentation for Insurance  (15 Minutes):   ASK: We discussed the diagnosis of obesity with [name] today and Kinlee agreed to give [place] permission to discuss obesity behavioral modification therapy today.  ASSESS: Evenie has the diagnosis of obesity and her BMI today is 28.5. Jaeleen is in the action stage of change.   ADVISE: Starlynn was educated on the multiple health risks of obesity as well as the benefit of weight loss to improve her health. She was advised of the need for long term treatment and the importance of lifestyle modifications to improve her current health and to decrease her risk of future health problems.  AGREE: Multiple dietary modification options and treatment options were discussed and Deaun agreed to follow the recommendations documented in the above note.  ARRANGE: Lilyen was educated on the importance of frequent visits to treat obesity as outlined per CMS and USPSTF guidelines and agreed to schedule her next follow up appointment today.  Attestation Statements:   Reviewed by clinician on day of visit: allergies, medications, problem list, medical history, surgical history, family history, social history, and previous encounter notes.  This visit occurred during the SARS-CoV-2 public health emergency. Safety protocols were in place, including screening questions prior to the visit, additional usage of staff PPE, and extensive  cleaning of exam room while observing appropriate contact time as indicated for disinfecting solutions. (CPT [id number])  I, Water quality scientist, CMA, am acting as transcriptionist for [employer], DO.  I have reviewed the above documentation for accuracy and completeness, and I agree with the above. Briscoe Deutscher, DO

## 2019-03-01 ENCOUNTER — Ambulatory Visit: Payer: Medicare HMO | Admitting: Physician Assistant

## 2019-03-01 ENCOUNTER — Encounter: Payer: Self-pay | Admitting: Physician Assistant

## 2019-03-01 VITALS — BP 130/62 | HR 56 | Temp 97.8°F | Ht 60.0 in | Wt 152.1 lb

## 2019-03-01 DIAGNOSIS — R1013 Epigastric pain: Secondary | ICD-10-CM | POA: Diagnosis not present

## 2019-03-01 DIAGNOSIS — Z86010 Personal history of colonic polyps: Secondary | ICD-10-CM | POA: Diagnosis not present

## 2019-03-01 DIAGNOSIS — K580 Irritable bowel syndrome with diarrhea: Secondary | ICD-10-CM

## 2019-03-01 DIAGNOSIS — K219 Gastro-esophageal reflux disease without esophagitis: Secondary | ICD-10-CM

## 2019-03-01 NOTE — Progress Notes (Signed)
Chief Complaint: Epigastric pain  HPI:    Courtney [name] is a 69 year old female with past medical history as listed below including her aortic valve sclerosis, CAD and others listed below, who was referred to me by Briscoe Deutscher, DO for a complaint of epigastric pain.      Per chart review patient was previously seen by Dr. Amedeo Plenty at [place].  02/22/2013 colonoscopy for hematochezia with internal hemorrhoids as well as a sessile polyp in the ascending colon.  Pathology showed benign polypoid mucosa.  Repeat recommended in 10 years.    02/23/2019 patient saw her physician in regards to weight and wellness, she has lost 11 pounds over the past year but at that time complained of some epigastric gnawing pain as well as an increase in IBS symptoms.    Today, patient presents clinic and explains that she has had irritable bowel syndrome for years and it typically is worse about 3 times a year.  When it is bad it is usually after she has eaten something which was not on her diet plan and results in a pain which is just inferior and to the left of her umbilicus.  She can push on the spot and it makes it feel better and then she places a heating pad there.  Describes it as a gnawing pain and typically uses Bentyl for it and it will eventually go away within 3 days or so.  This most recently happened after Christmas day when she ate some buffalo wings etc.  She is now keeping a food journal for her weight management physician and is hoping that she can find a trigger for this.  Tells me that currently she is in no pain.  She has never tried scheduling her Bentyl.  Explains that typically she can radiate back-and-forth from diarrhea and food that goes straight through her to normal stools.    Also describes that she is on Pantoprazole 40 mg daily chronically but this was just changed to Dexilant 30 mg daily by her weight loss physician.  She just received this medication and will try using it instead of the Pantoprazole to  see if it decreases frequency of attacks.  Tells me she has never had an endoscopy but is not really interested in procedures.    Explains that she had a diagnosis of a carcinoid polyp back in 2003 and had frequent colonoscopies after that with [facility], her last in 2015 with nothing other than a benign polyp and she was told to follow-up in 10 years.    Denies fever, chills, blood in her stool, nausea, vomiting or symptoms that awaken her from sleep.     Past Medical History:  Diagnosis Date  . Aortic valve sclerosis    echo 17/4944, soft systolic murmur  . 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 Back pain   . Carotid artery disease ([place])    40-59% bilateral, doppler December 2010  . Constipation   . Coronary artery disease    a. BMS-mid RCA 11/2007 b. stress echo 02/2009 subtle inferior HK, lateral ST depressions with stress c. follow-up cath 02/2009: RCA stent patent, severe but stable stenosis of distal posterior lateral branch RCA. LV normal, med tx unless more sx c. ETT Myoview 08/27/12: negative for ischemia; EF 64%  . Decreased hearing   . Depression    Mild, August, 2012  . Dyslipidemia   . Easy bruising   . Ejection fraction    EF normal, catheterization,  2011 /  EF 55%, echo, 2010  . Fluid overload    Mild, stable since hospitalization in the past  . GERD (gastroesophageal reflux disease)    occarionally, takes Pepcid over the counter  . Heart murmur    has, occasional PVC's  . History of right hip replacement   . Hyperlipidemia   . Hypertension   . Joint pain   . Myocardial infarction ([employer])    2009  . Palpitations   . PVC (premature ventricular contraction)    per prior Holter monitor  . Right hip pain 12/2009  . Shellfish allergy   . Trouble in sleeping   . Vitamin B12 deficiency   . Vitamin D deficiency     Past Surgical History:  Procedure Laterality Date  . 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/01/2005  . carcinoid tumor removal    . CARDIAC CATHETERIZATION  1/211   Patent RCA  stent, stenotic PLB supplying small distribution; medically managed  . CORONARY ANGIOPLASTY WITH STENT PLACEMENT  11/2007   BMS-mid RCA  . DIAGNOSTIC LAPAROSCOPY     1982 for endometriosis  . DILATION AND CURETTAGE OF UTERUS  2005  . hx of wisdom teeth surgery    . SKIN CANCER EXCISION    . TOTAL HIP ARTHROPLASTY Right 07/08/2014   Procedure: RIGHT TOTAL HIP ARTHROPLASTY ANTERIOR APPROACH;  Surgeon: Mcarthur Rossetti, MD;  Location: [facility];  Service: Orthopedics;  Laterality: Right;    Current Outpatient Medications  Medication Sig Dispense Refill  . acetaminophen (TYLENOL) 500 MG tablet Take 1,000 mg by mouth every 6 (six) hours as needed (Pain).     [name] aspirin 81 MG tablet Take 81 mg by mouth daily.    [name] Dexlansoprazole 30 MG capsule Take 1 capsule (30 mg total) by mouth daily. 30 capsule 0  . dicyclomine (BENTYL) 20 MG tablet Take 1 tablet (20 mg total) by mouth every 6 (six) hours. 120 tablet 1  . ezetimibe (ZETIA) 10 MG tablet Take 1 tablet (10 mg total) by mouth daily. 90 tablet 3  . metoprolol tartrate (LOPRESSOR) 25 MG tablet Take 0.5 tablets (12.5 mg total) by mouth 2 (two) times daily. 90 tablet 3  . nitroGLYCERIN (NITROSTAT) 0.4 MG SL tablet Place 1 tablet (0.4 mg total) under the tongue every 5 (five) minutes as needed for chest pain. 25 tablet 11  . pantoprazole (PROTONIX) 40 MG tablet Take 1 tablet (40 mg total) by mouth daily. 90 tablet 3  . ramipril (ALTACE) 10 MG capsule Take 1 capsule (10 mg total) by mouth daily. 90 capsule 3  . rosuvastatin (CRESTOR) 40 MG tablet Take 1 tablet (40 mg total) by mouth daily. 90 tablet 3  . Vitamin D, Ergocalciferol, (DRISDOL) 1.25 MG (50000 UT) CAPS capsule Take 1 capsule (50,000 Units total) by mouth every 7 (seven) days. 4 capsule 0   No current facility-administered medications for this visit.    Allergies as of 03/01/2019 - Review Complete 02/23/2019  Allergen Reaction Noted  . Erythromycin Hives   . Penicillins Hives   .  Shellfish allergy Hives 11/10/2014    Family History  Problem Relation Age of Onset  . Heart attack Father 55  . Hyperlipidemia Father   . Hypertension Father   . Sudden death Father   . Stroke Mother   . Hyperlipidemia Mother   . Hypertension Mother   . Heart disease Mother   . Obesity Mother   . Heart attack Paternal Grandfather   . Heart disease Sister   .  BRCA 1/2 Neg Hx   . Breast cancer Neg Hx     Social History   Socioeconomic History  . Marital status: Divorced    Spouse name: Not on file  . Number of children: 1  . Years of education: 55  . Highest education level: Not on file  Occupational History  . Occupation: Retired - Retail buyer  Tobacco Use  . Smoking status: Never Smoker  . Smokeless tobacco: Never Used  Substance and Sexual Activity  . Alcohol use: Yes    Alcohol/week: 7.0 standard drinks    Types: 7 Glasses of wine per week    Comment: 1 glass of wine/night  . Drug use: No  . Sexual activity: Yes  Other Topics Concern  . Not on file  Social History Narrative   Fun: Dance, garden,    Denies religious beliefs effecting health care.   Denies abuse and feels safe at home   Social Determinants of Health   Financial Resource Strain: Low Risk   . Difficulty of Paying Living Expenses: Not hard at all  Food Insecurity: No Food Insecurity  . Worried About Charity fundraiser in the Last Year: Never true  . Ran Out of Food in the Last Year: Never true  Transportation Needs: No Transportation Needs  . Lack of Transportation (Medical): No  . Lack of Transportation (Non-Medical): No  Physical Activity: Insufficiently Active  . Days of Exercise per Week: 3 days  . Minutes of Exercise per Session: 30 min  Stress: No Stress Concern Present  . Feeling of Stress : Not at all  Social Connections: Unknown  . Frequency of Communication with Friends and Family: More than three times a week  . Frequency of Social Gatherings with Friends and Family: More  than three times a week  . Attends Religious Services: Not on file  . Active Member of Clubs or Organizations: Not on file  . Attends Archivist Meetings: Not on file  . Marital Status: Living with partner  Intimate Partner Violence: Not At Risk  . Fear of Current or Ex-Partner: No  . Emotionally Abused: No  . Physically Abused: No  . Sexually Abused: No    Review of Systems:    Constitutional: No weight loss, fever or chills Skin: No rash Cardiovascular: No chest pain Respiratory: No SOB  Gastrointestinal: See HPI and otherwise negative Genitourinary: No dysuria Neurological: No headache Musculoskeletal: No new muscle or joint pain Hematologic: No bleeding  Psychiatric: No history of depression or anxiety   Physical Exam:  Vital signs: BP 130/62   Pulse (!) 56   Temp 97.8 F (36.6 C)   Ht 5' (1.524 m)   Wt 152 lb 2 oz (69 kg)   BMI 29.71 kg/m   Constitutional:   Pleasant Elderly Caucasian female appears to be in NAD, Well developed, Well nourished, alert and cooperative Head:  Normocephalic and atraumatic. Eyes:   PEERL, EOMI. No icterus. Conjunctiva pink. Ears:  Normal auditory acuity. Neck:  Supple Throat: Oral cavity and pharynx without inflammation, swelling or lesion.  Respiratory: Respirations even and unlabored. Lungs clear to auscultation bilaterally.   No wheezes, crackles, or rhonchi.  Cardiovascular: Normal S1, S2. No MRG. Regular rate and rhythm. No peripheral edema, cyanosis or pallor.  Gastrointestinal:  Soft, nondistended, nontender. No rebound or guarding. Normal bowel sounds. No appreciable masses or hepatomegaly. Rectal:  Not performed.  Msk:  Symmetrical without gross deformities. Without edema, no deformity or joint abnormality.  Neurologic:  Alert and  oriented x4;  grossly normal neurologically.  Skin:   Dry and intact without significant lesions or rashes. Psychiatric: Demonstrates good judgement and reason without abnormal affect or  behaviors.  MOST RECENT LABS AND IMAGING: CBC    Component Value Date/Time   WBC 7.6 01/25/2019 0853   WBC 12.2 (H) 10/14/2017 1923   RBC 3.91 01/25/2019 0853   RBC 4.21 10/14/2017 1923   HGB 12.3 01/25/2019 0853   HCT 36.7 01/25/2019 0853   PLT 201 01/25/2019 0853   MCV 94 01/25/2019 0853   MCH 31.5 01/25/2019 0853   MCH 31.8 10/14/2017 1923   MCHC 33.5 01/25/2019 0853   MCHC 33.8 10/14/2017 1923   RDW 12.3 01/25/2019 0853   LYMPHSABS 3.1 01/25/2019 0853   MONOABS 0.8 10/14/2017 1923   EOSABS 0.3 01/25/2019 0853   BASOSABS 0.1 01/25/2019 0853    CMP     Component Value Date/Time   NA 141 01/25/2019 0853   K 4.6 01/25/2019 0853   CL 105 01/25/2019 0853   CO2 22 01/25/2019 0853   GLUCOSE 104 (H) 01/25/2019 0853   GLUCOSE 92 10/14/2017 1923   BUN 21 01/25/2019 0853   CREATININE 0.72 01/25/2019 0853   CALCIUM 9.7 01/25/2019 0853   PROT 6.7 01/25/2019 0853   ALBUMIN 4.7 01/25/2019 0853   AST 21 01/25/2019 0853   ALT 20 01/25/2019 0853   ALKPHOS 66 01/25/2019 0853   BILITOT 0.3 01/25/2019 0853   GFRNONAA 85 01/25/2019 0853   GFRAA 98 01/25/2019 0853    Assessment: 1.  IBS-D: Patient maintains this with a probiotic, still does have some food that goes straight through her as well as this pain that comes and goes more so on her lower abdomen likely related to IBS or functional dyspepsia 2.  Abdominal pain: See above 3.  GERD: Controlled on Pantoprazole 40 mg daily, going to switch to Dexilant 30 mg as given by her weight loss physician 4.  History of polyps: Last colonoscopy in 2015 with benign polyp, repeat recommended in 10 years  Plan: 1. Discussed possible EGD with the patient and she declines a procedure at this time.  She does not feels necessary but will call us the next time when she is having an attack to discuss further. 2.  Discussed with the patient that she could try the Dexilant to see if it is helpful.  She would need to stop her Pantoprazole while she  is taking this medication.  She verbalized understanding.  Tells me it is somewhat more expensive for her so she will not continue it if it is not helping. 3.  Reviewed antireflux diet and lifestyle modifications. 4.  Discussed adding a fiber supplement to help with loose bowel habits.  Would recommend getting 25 to 35 g of fiber a day through her diet and/or supplement.  She wants to discuss further with her weight loss physician. 5.  Continue daily probiotic.  Patient reports that this helps with gas. 6.  Patient is already in our system for recall colonoscopy in 2025 with Dr. Carlean Purl. 7.  Also discussed scheduling out Bentyl but patient prefers not to take more medication than she needs.  She can use it as needed though for any discomfort that she experiences.  She already has enough of this but told her she could call us for refills if needed. 8.  Patient to follow in clinic with [place] prior to time of recommended colonoscopy as needed.  Anderson Malta  Apolonio Schneiders [facility] 03/01/2019, 3:17 PM  Cc: Briscoe Deutscher, DO

## 2019-03-01 NOTE — Patient Instructions (Signed)
If you are age 71 or older, your body mass index should be between 23-30. Your Body mass index is 29.71 kg/m. If this is out of the aforementioned range listed, please consider follow up with your Primary Care Provider.  If you are age 65 or younger, your body mass index should be between 19-25. Your Body mass index is 29.71 kg/m. If this is out of the aformentioned range listed, please consider follow up with your Primary Care Provider.   Try Dexilant to see if that helps better.  Call and let us know if you have another flare.  Discuss starting a fiber supplement.

## 2019-03-08 IMAGING — US ULTRASOUND RIGHT BREAST LIMITED
1 series · 4 of 4 positions shown · non-contrast
Comparison: Previous exam(s).

CLINICAL DATA: One year interval follow-up of a likely benign
mildly complex cyst involving the LOWER RIGHT breast at MIDDLE
depth. Annual evaluation, LEFT breast.

EXAM:
DIGITAL DIAGNOSTIC BILATERAL MAMMOGRAM WITH CAD
ULTRASOUND RIGHT BREAST

[Series 1: ultrasound right breast limited · 0.05mm/px · 4 of 4 slices shown]
[im 1/4]
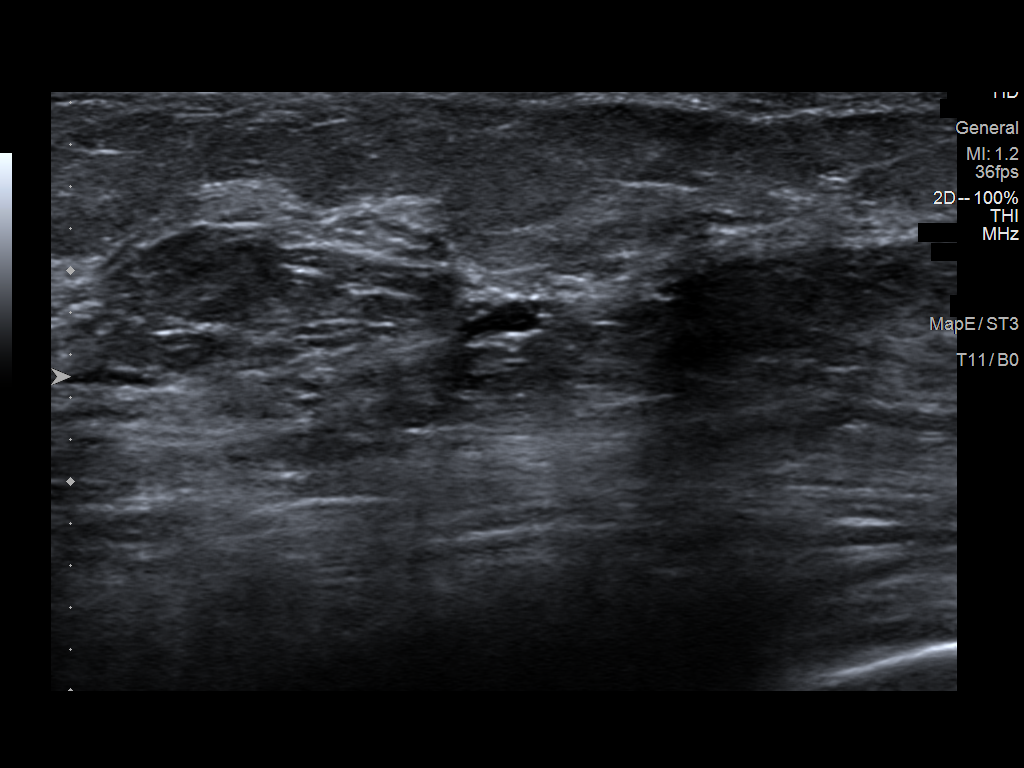
[im 2/4]
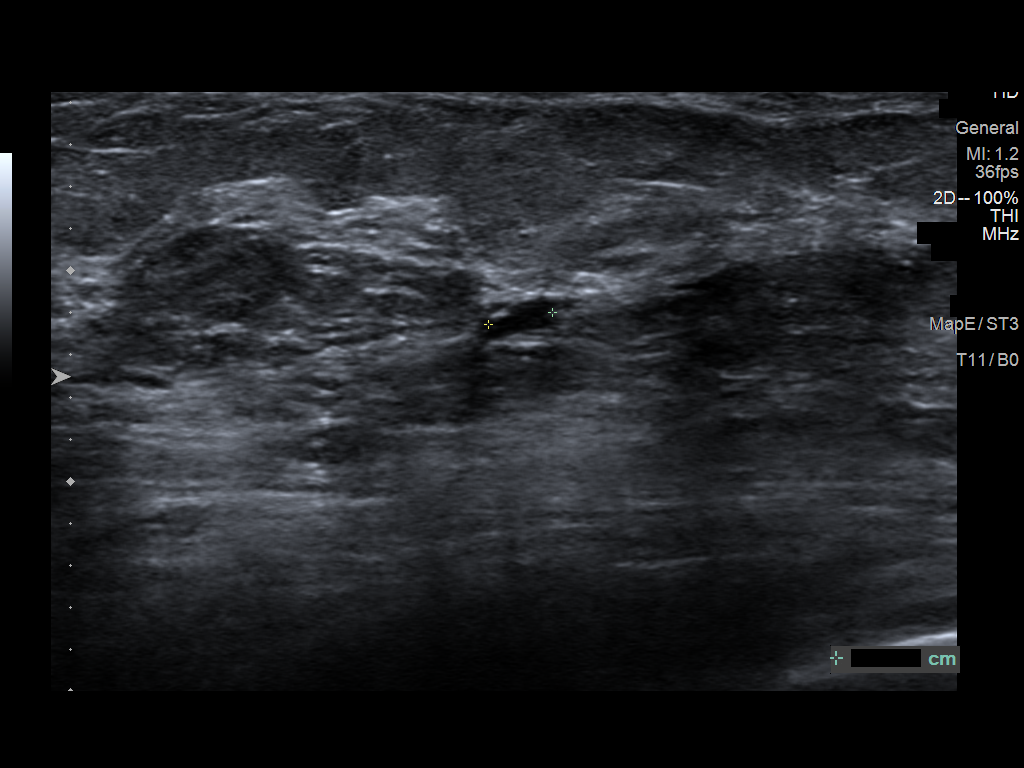
[im 3/4]
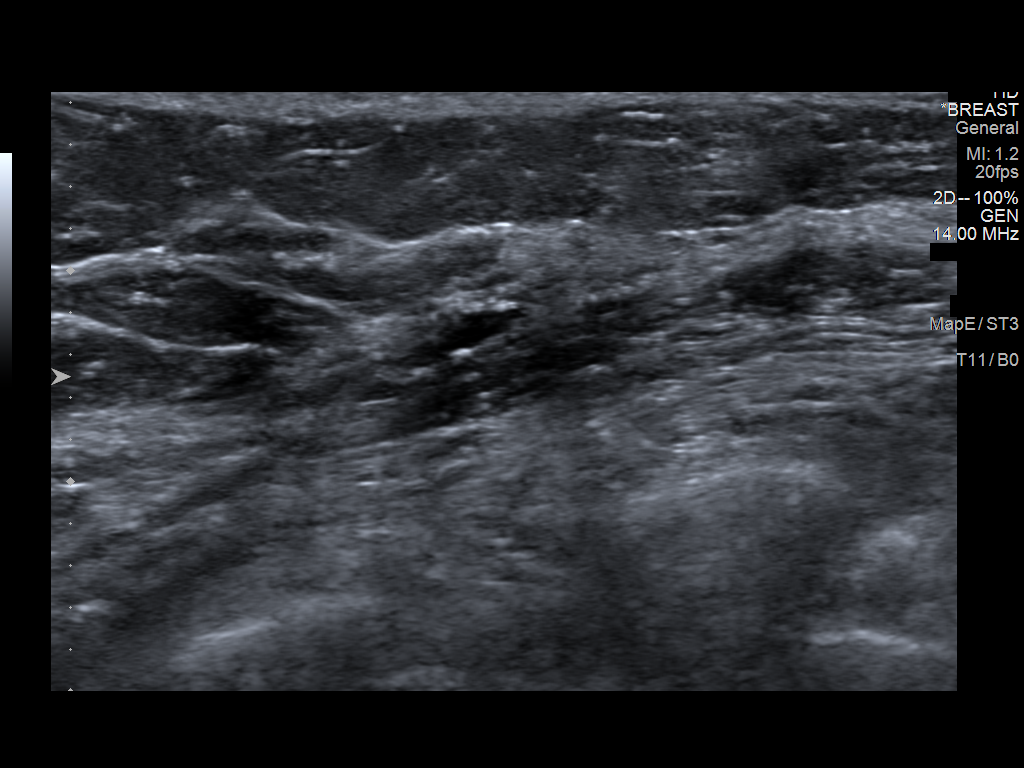
[im 4/4]
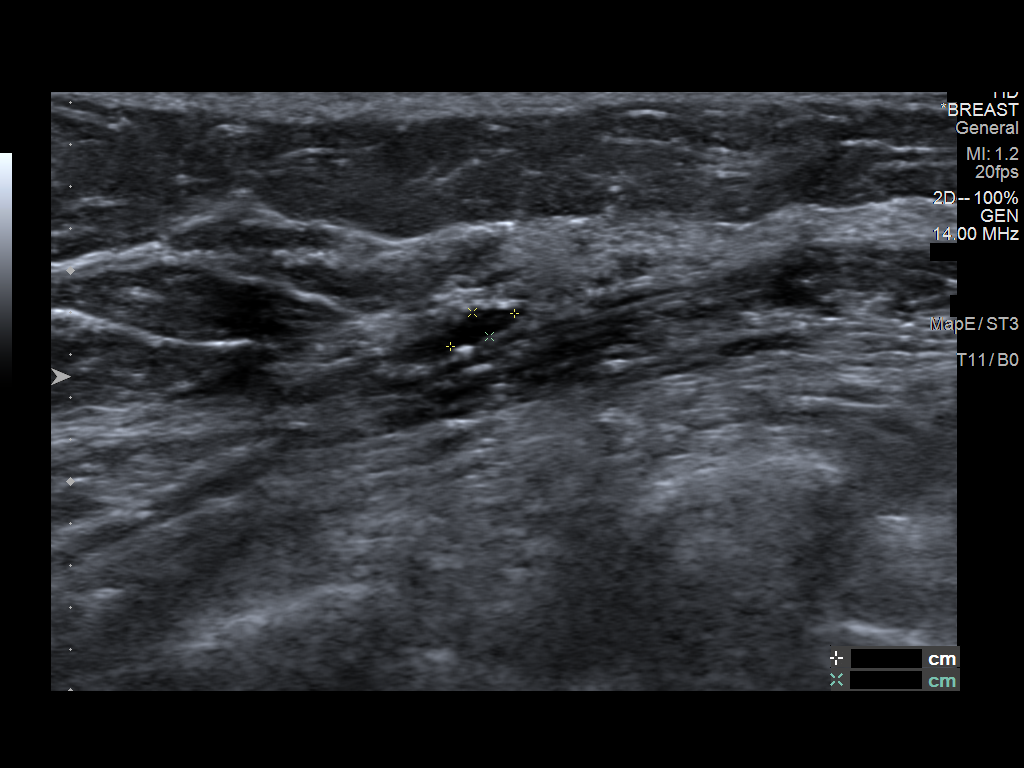

[4 of 4 positions shown; findings below may reference images not displayed]

ACR Breast Density Category c: The breast tissue is heterogeneously
dense, which may obscure small masses.
FINDINGS: Tomosynthesis and synthesized full field CC and MLO views of both
breasts were obtained.

The circumscribed low-density oval mass in the LOWER RIGHT breast at
MIDDLE depth identified previously is less conspicuous on today's
examination. No new or suspicious findings elsewhere in the RIGHT
breast.

No findings suspicious for malignancy in the LEFT breast.

Mammographic images were processed with CAD.

Targeted LEFT breast ultrasound is performed, showing that the
previously identified circumscribed oval parallel nearly anechoic
mass at the 6 o'clock position approximately 4 cm from nipple at
MIDDLE depth has decreased in size since the examination 1 year ago,
currently measuring approximately 1 x 3 x 3 mm (1 x 3 x 6 mm in
March 2017).
IMPRESSION: 1. No mammographic or sonographic evidence of malignancy involving
the RIGHT breast.
2. No mammographic evidence of malignancy involving the LEFT breast.
3. The mass in the LOWER RIGHT breast has decreased in size since
the examination 1 year ago, confirming a benign cyst.

RECOMMENDATION:
Screening mammogram in one year.(Code:FE-1-MSM)

I have discussed the findings and recommendations with the patient.
Results were also provided in writing at the conclusion of the
visit. If applicable, a reminder letter will be sent to the patient
regarding the next appointment.

BI-RADS CATEGORY  2: Benign.

## 2019-03-08 IMAGING — MG DIGITAL DIAGNOSTIC BILATERAL MAMMOGRAM WITH TOMO AND CAD
8 series · 8 of 24 positions shown · non-contrast
Comparison: Previous exam(s).

CLINICAL DATA: One year interval follow-up of a likely benign
mildly complex cyst involving the LOWER RIGHT breast at MIDDLE
depth. Annual evaluation, LEFT breast.

EXAM:
DIGITAL DIAGNOSTIC BILATERAL MAMMOGRAM WITH CAD
ULTRASOUND RIGHT BREAST

[L CC synth-2D]
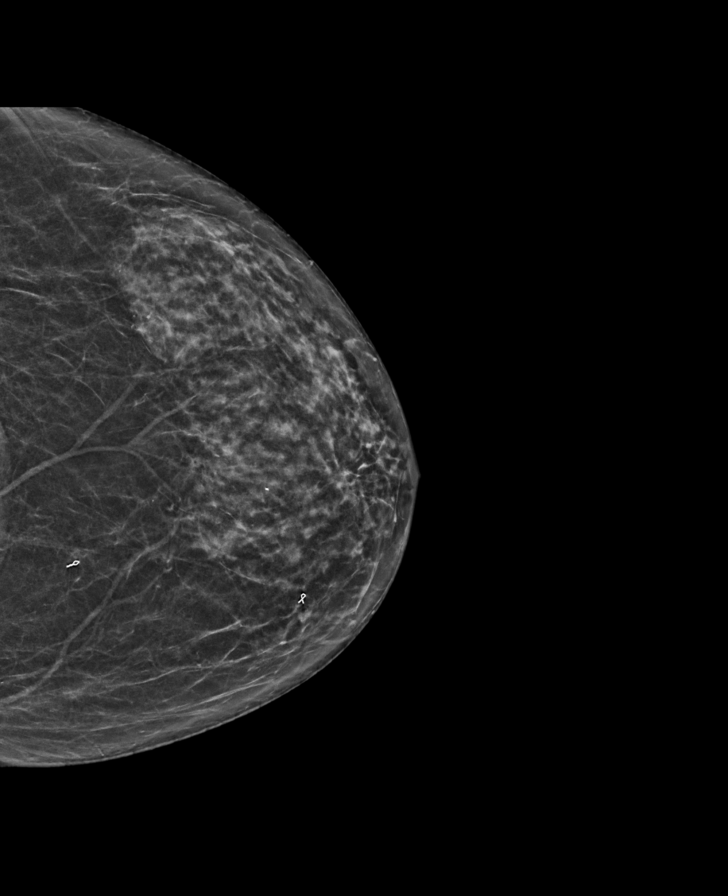

[R CC synth-2D]
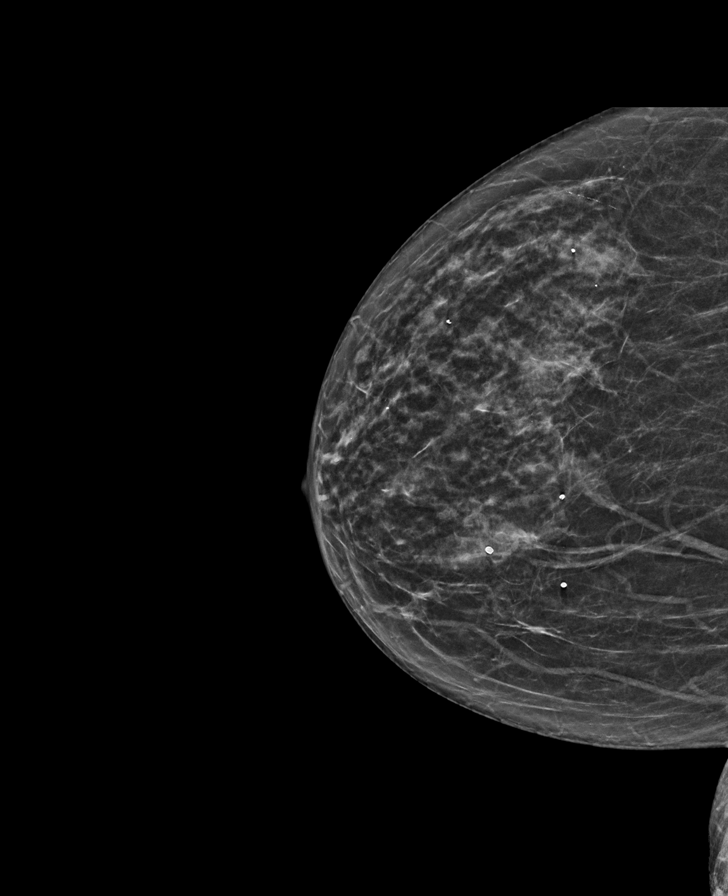

[R MLO synth-2D]
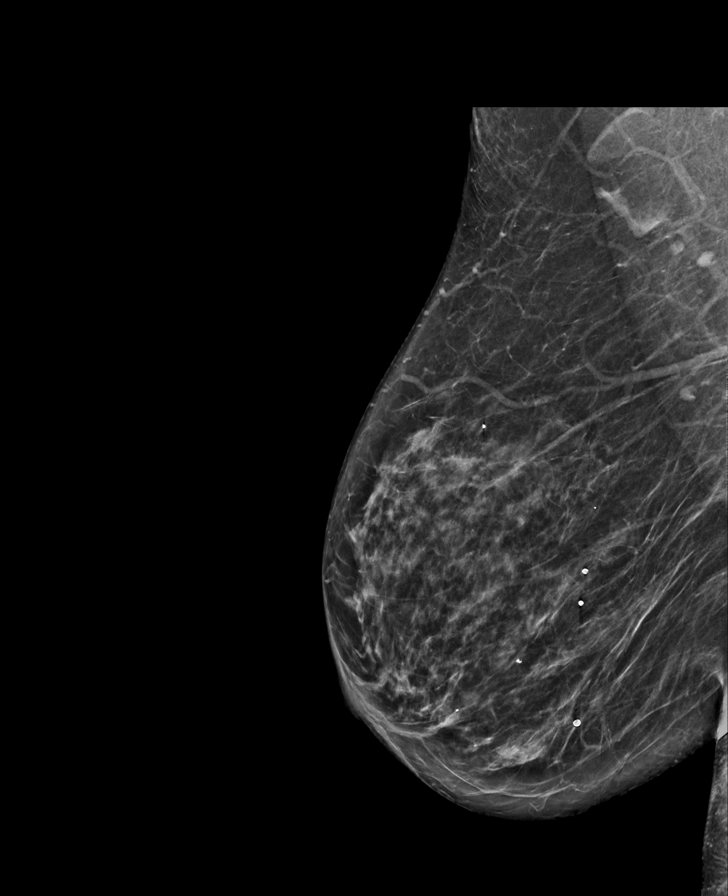

[L MLO synth-2D]
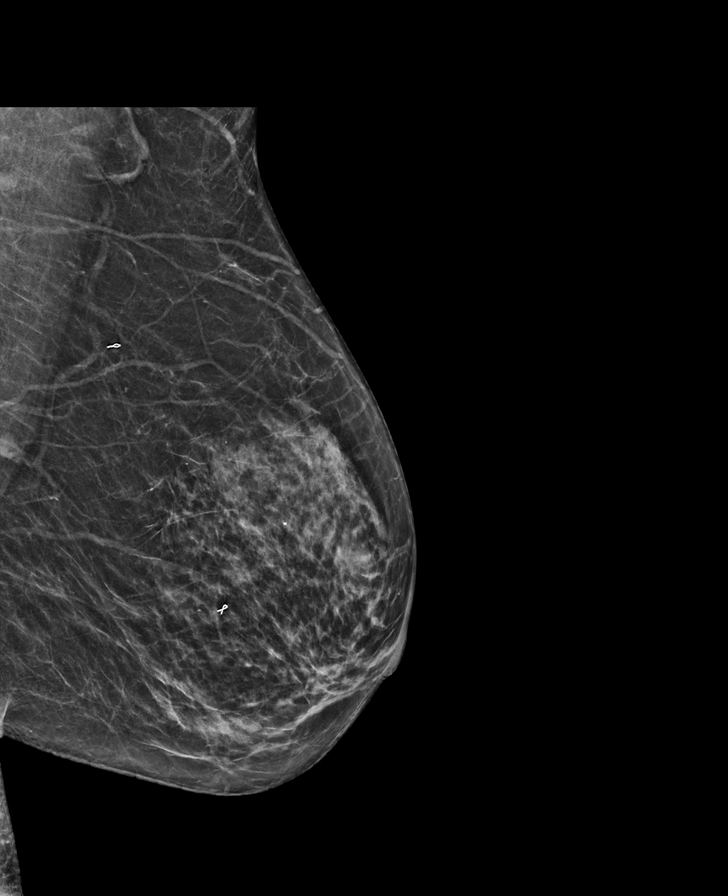

[L MLO tomo · tomo slice 33/65.0]
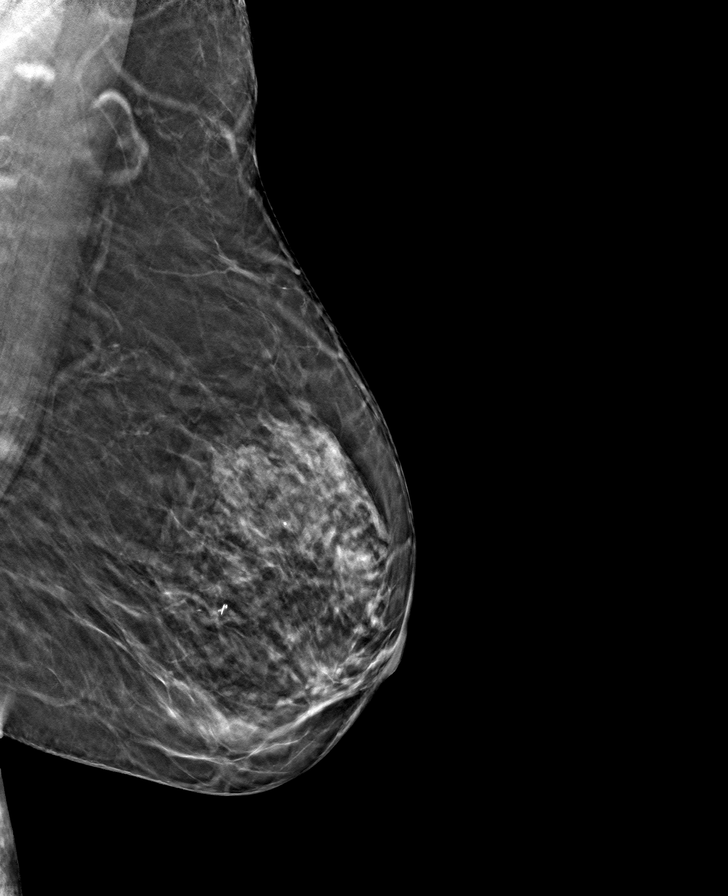

[R CC tomo · tomo slice 28/55.0]
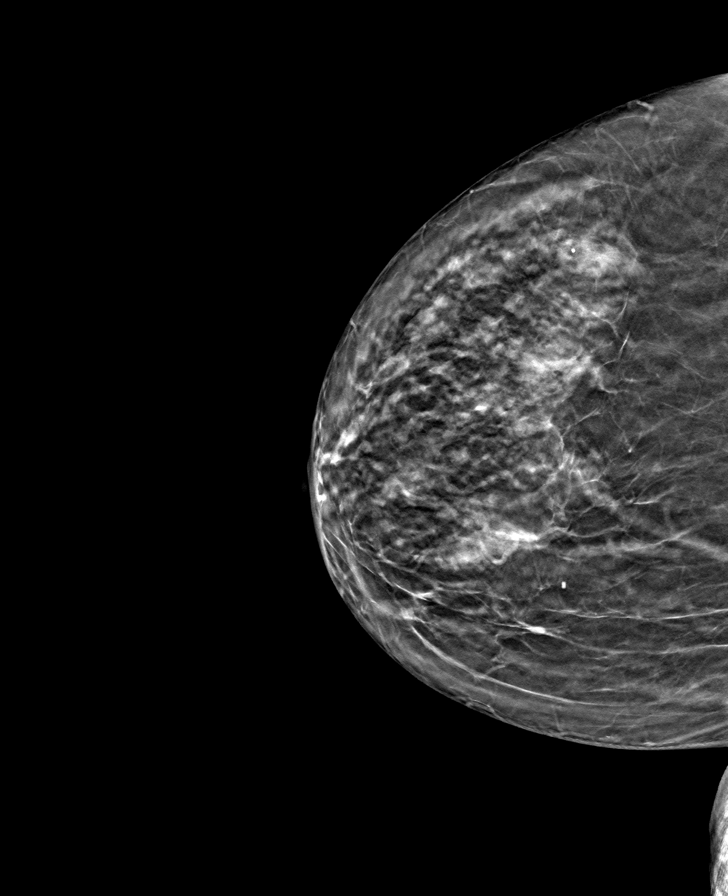

[L CC tomo · tomo slice 29/56.0]
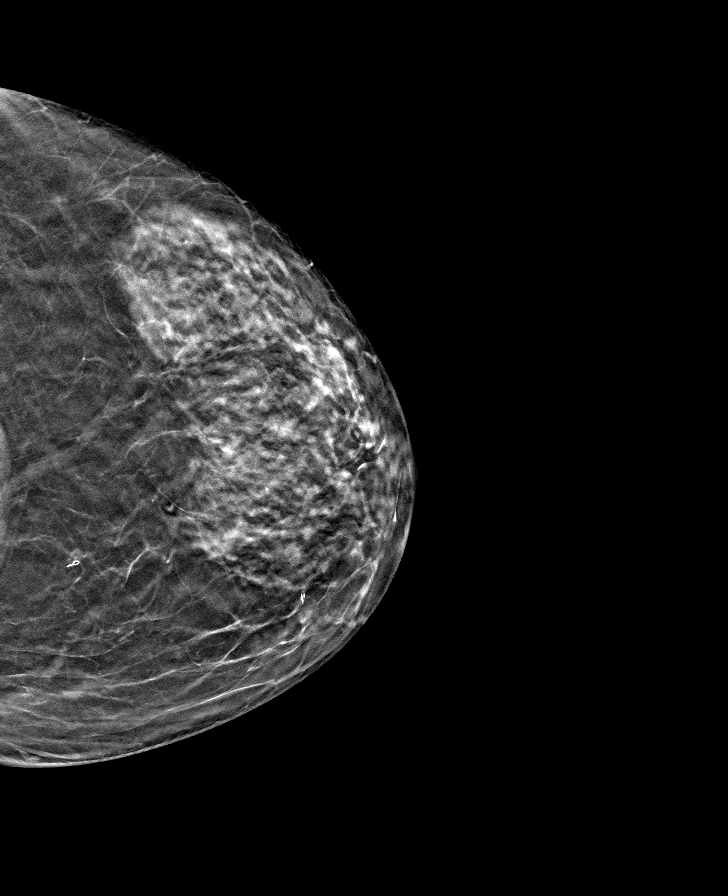

[R MLO tomo · tomo slice 34/67.0]
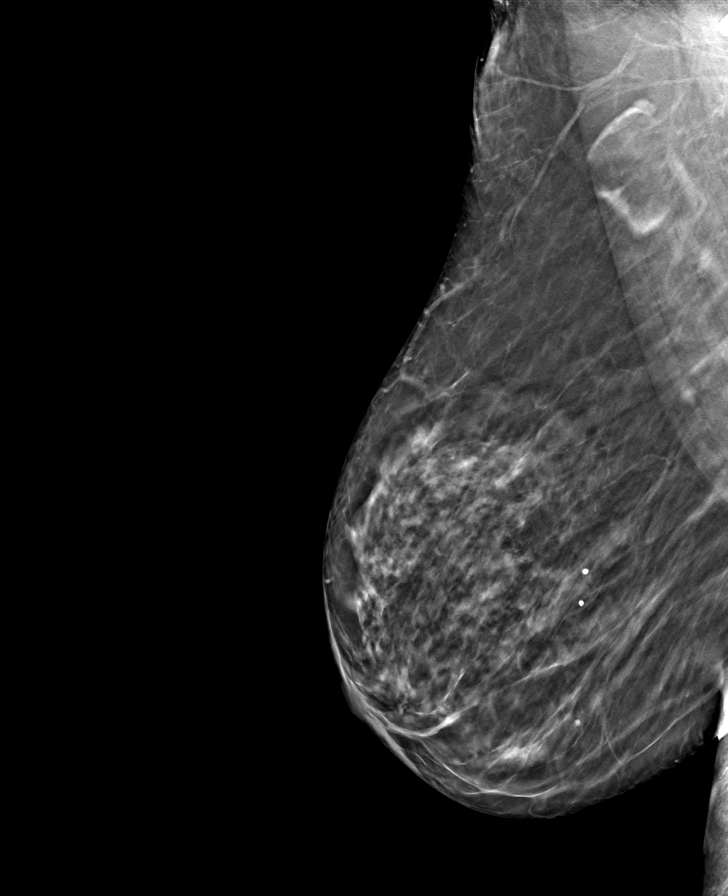

[8 of 24 positions shown; findings below may reference images not displayed]

ACR Breast Density Category c: The breast tissue is heterogeneously
dense, which may obscure small masses.
FINDINGS: Tomosynthesis and synthesized full field CC and MLO views of both
breasts were obtained.

The circumscribed low-density oval mass in the LOWER RIGHT breast at
MIDDLE depth identified previously is less conspicuous on today's
examination. No new or suspicious findings elsewhere in the RIGHT
breast.

No findings suspicious for malignancy in the LEFT breast.

Mammographic images were processed with CAD.

Targeted LEFT breast ultrasound is performed, showing that the
previously identified circumscribed oval parallel nearly anechoic
mass at the 6 o'clock position approximately 4 cm from nipple at
MIDDLE depth has decreased in size since the examination 1 year ago,
currently measuring approximately 1 x 3 x 3 mm (1 x 3 x 6 mm in
March 2017).
IMPRESSION: 1. No mammographic or sonographic evidence of malignancy involving
the RIGHT breast.
2. No mammographic evidence of malignancy involving the LEFT breast.
3. The mass in the LOWER RIGHT breast has decreased in size since
the examination 1 year ago, confirming a benign cyst.

RECOMMENDATION:
Screening mammogram in one year.(Code:FE-1-MSM)

I have discussed the findings and recommendations with the patient.
Results were also provided in writing at the conclusion of the
visit. If applicable, a reminder letter will be sent to the patient
regarding the next appointment.

BI-RADS CATEGORY  2: Benign.

## 2019-03-11 MED ORDER — RAMIPRIL 10 MG PO CAPS: 10.0000 mg | ORAL_CAPSULE | Freq: Every day | ORAL | 3 refills | Status: DC

## 2019-03-22 ENCOUNTER — Encounter (INDEPENDENT_AMBULATORY_CARE_PROVIDER_SITE_OTHER): Payer: Self-pay | Admitting: Family Medicine

## 2019-03-22 ENCOUNTER — Other Ambulatory Visit: Payer: Self-pay

## 2019-03-22 ENCOUNTER — Ambulatory Visit (INDEPENDENT_AMBULATORY_CARE_PROVIDER_SITE_OTHER): Payer: Medicare HMO | Admitting: Family Medicine

## 2019-03-22 VITALS — BP 157/66 | HR 50 | Temp 98.3°F | Ht 60.0 in | Wt 145.0 lb

## 2019-03-22 DIAGNOSIS — I2510 Atherosclerotic heart disease of native coronary artery without angina pectoris: Secondary | ICD-10-CM

## 2019-03-22 DIAGNOSIS — Z9189 Other specified personal risk factors, not elsewhere classified: Secondary | ICD-10-CM | POA: Diagnosis not present

## 2019-03-22 DIAGNOSIS — K588 Other irritable bowel syndrome: Secondary | ICD-10-CM | POA: Diagnosis not present

## 2019-03-22 DIAGNOSIS — R1013 Epigastric pain: Secondary | ICD-10-CM

## 2019-03-22 DIAGNOSIS — Z6830 Body mass index (BMI) 30.0-30.9, adult: Secondary | ICD-10-CM

## 2019-03-22 DIAGNOSIS — E669 Obesity, unspecified: Secondary | ICD-10-CM

## 2019-03-22 DIAGNOSIS — E559 Vitamin D deficiency, unspecified: Secondary | ICD-10-CM | POA: Diagnosis not present

## 2019-03-22 DIAGNOSIS — E66811 Obesity, class 1: Secondary | ICD-10-CM

## 2019-03-22 DIAGNOSIS — G479 Sleep disorder, unspecified: Secondary | ICD-10-CM | POA: Diagnosis not present

## 2019-03-22 MED ORDER — VITAMIN D (ERGOCALCIFEROL) 1.25 MG (50000 UNIT) PO CAPS: 50000.0000 [IU] | ORAL_CAPSULE | ORAL | 0 refills | Status: DC

## 2019-03-22 NOTE — Progress Notes (Signed)
Chief Complaint:   OBESITY Courtney [name] is here to discuss her progress with her obesity treatment plan along with follow-up of her obesity related diagnoses. Courtney [name] is on the Category 1 Plan and states she is following her eating plan approximately 95% of the time. Courtney [name] states she is walking for 40 minutes 2 times per week.  Today's visit was #: 60 Starting weight: 157 lbs Starting date: 06/16/2017 Today's weight: 145 lbs Today's date: 03/22/2019 Total lbs lost to date: 12 lbs Total lbs lost since last in-office visit: 1 lb  Interim History: I reviewed the MFP printout with the patient today.  She is at goal with her calorie intake, protein intake is 65-85 grams, carbohydrates low 100s.  She is drinking plenty of water.  She is up 1 pound of water weight.  Overall, Courtney [name] is frustrated with her lack of weight loss.  Subjective:   1. Dyspepsia I reviewed the 03/01/2019 note with GI with the patient.  I switched her to [place] at last visit.  Dexilant is not more helpful than Protonix, and patient reports that Dexilant costs $90 per month.  Patient declined EGD.  2. Other irritable bowel syndrome She is taking Bentyl as needed.  3. Coronary artery disease without angina pectoris Followed by Cardiology.  Blood pressure is elevated today at 157/66.  Pulse is 50.  4. Sleep disorder Courtney [name] says trazodone caused nightmares. She also has restless legs syndrome.  5. Vitamin D deficiency Courtney [name]'s Vitamin D level was 43.2 on 01/25/2019. She is currently taking vit D. She denies nausea, vomiting or muscle weakness.  Assessment/Plan:   1. Dyspepsia Will change patient back from [place] to Protonix due to cost.   2. Other irritable bowel syndrome I recommend adding fiber 25+ grams per day or Metamucil 1 capful twice daily.  3. Coronary artery disease without angina pectoris, unspecified vessel or lesion type, unspecified whether native or transplanted heart Will monitor.  4. Sleep  disorder Will follow.  5. Vitamin D deficiency Low Vitamin D level contributes to fatigue and are associated with obesity, breast, and colon cancer. She agrees to continue to take prescription Vitamin D @50 ,000 IU every week and will follow-up for routine testing of Vitamin D, at least 2-3 times per year to avoid over-replacement.  Orders - Vitamin D, Ergocalciferol, (DRISDOL) 1.25 MG (50000 UNIT) CAPS capsule; Take 1 capsule (50,000 Units total) by mouth every 7 (seven) days.  Dispense: 4 capsule; Refill: 0  6. At risk for deficient intake of food Courtney [name] was given approximately 15 minutes of deficit intake of food prevention counseling today. Courtney [name] is at risk for eating too few calories based on current food recall. She was encouraged to focus on meeting caloric and protein goals according to her recommended meal plan.   7. Class 1 obesity with serious comorbidity and body mass index (BMI) of 30.0 to 30.9 in adult, unspecified obesity type Courtney [name] is currently in the action stage of change. As such, her goal is to continue with weight loss efforts. She has agreed to keeping a food journal and adhering to recommended goals of 1000-1200 calories and 85+ grams of protein or low carb diet for 2 weeks.  Exercise goals: Strength training for 20 minutes 3-4 times per week.  Behavioral modification strategies: increasing lean protein intake and keeping a strict food journal.  Courtney [name] has agreed to follow-up with our clinic in 2 weeks. She was informed of the importance of frequent follow-up visits to maximize her success  with intensive lifestyle modifications for her multiple health conditions.   Objective:   Blood pressure (!) 157/66, pulse (!) 50, temperature 98.3 F (36.8 C), temperature source Oral, height 5' (1.524 m), weight 145 lb (65.8 kg), SpO2 98 %. Body mass index is 28.32 kg/m.  General: Cooperative, alert, well developed, in no acute distress. HEENT: Conjunctivae and lids  unremarkable. Cardiovascular: Regular rhythm.  Lungs: Normal work of breathing. Neurologic: No focal deficits.   Lab Results  Component Value Date   CREATININE 0.72 01/25/2019   BUN 21 01/25/2019   NA 141 01/25/2019   K 4.6 01/25/2019   CL 105 01/25/2019   CO2 22 01/25/2019   Lab Results  Component Value Date   ALT 20 01/25/2019   AST 21 01/25/2019   ALKPHOS 66 01/25/2019   BILITOT 0.3 01/25/2019   Lab Results  Component Value Date   HGBA1C 5.4 09/14/2018   HGBA1C 5.6 03/03/2018   HGBA1C 5.6 10/15/2017   HGBA1C 5.6 06/16/2017   Lab Results  Component Value Date   INSULIN 4.2 09/14/2018   INSULIN 5.0 03/03/2018   INSULIN 5.9 10/15/2017   INSULIN 6.4 06/16/2017   Lab Results  Component Value Date   TSH 0.931 06/16/2017   Lab Results  Component Value Date   CHOL 151 01/25/2019   HDL 66 01/25/2019   LDLCALC 72 01/25/2019   TRIG 63 01/25/2019   CHOLHDL 2.5 10/29/2016   Lab Results  Component Value Date   WBC 7.6 01/25/2019   HGB 12.3 01/25/2019   HCT 36.7 01/25/2019   MCV 94 01/25/2019   PLT 201 01/25/2019   Lab Results  Component Value Date   IRON 100 01/25/2019   TIBC 328 01/25/2019   FERRITIN 54 01/25/2019   Attestation Statements:   Reviewed by clinician on day of visit: allergies, medications, problem list, medical history, surgical history, family history, social history, and previous encounter notes.  I, Water quality scientist, CMA, am acting as Location manager for [employer], DO.  I have reviewed the above documentation for accuracy and completeness, and I agree with the above. Briscoe Deutscher, DO

## 2019-03-30 ENCOUNTER — Other Ambulatory Visit: Payer: Self-pay | Admitting: Obstetrics and Gynecology

## 2019-03-30 DIAGNOSIS — Z1231 Encounter for screening mammogram for malignant neoplasm of breast: Secondary | ICD-10-CM

## 2019-04-12 ENCOUNTER — Ambulatory Visit (INDEPENDENT_AMBULATORY_CARE_PROVIDER_SITE_OTHER): Payer: Medicare HMO | Admitting: Family Medicine

## 2019-04-12 ENCOUNTER — Other Ambulatory Visit: Payer: Self-pay

## 2019-04-12 ENCOUNTER — Encounter (INDEPENDENT_AMBULATORY_CARE_PROVIDER_SITE_OTHER): Payer: Self-pay | Admitting: Family Medicine

## 2019-04-12 VITALS — BP 124/65 | HR 50 | Temp 97.8°F | Ht 60.0 in | Wt 147.0 lb

## 2019-04-12 DIAGNOSIS — G4709 Other insomnia: Secondary | ICD-10-CM

## 2019-04-12 DIAGNOSIS — Z6830 Body mass index (BMI) 30.0-30.9, adult: Secondary | ICD-10-CM

## 2019-04-12 DIAGNOSIS — E669 Obesity, unspecified: Secondary | ICD-10-CM

## 2019-04-12 MED ORDER — ESZOPICLONE 1 MG PO TABS: 1.0000 mg | ORAL_TABLET | Freq: Every evening | ORAL | 0 refills | Status: DC | PRN

## 2019-04-12 NOTE — Progress Notes (Signed)
Chief Complaint:   OBESITY Courtney [name] is here to discuss her progress with her obesity treatment plan along with follow-up of her obesity related diagnoses. Courtney [name] is on the Category 1 Plan and states she is following her eating plan approximately 85% of the time. Courtney [name] states she is doing yard work 2 times per week.  Today's visit was #: 34 Starting weight: 157 lbs Starting date: 06/16/2017 Today's weight: 147 lbs Today's date: 04/12/2019 Total lbs lost to date: 10 lbs Total lbs lost since last in-office visit: 0  Interim History: Courtney [name] has been active cleaning out a small house/large shed and is retaining fluid today.  She goes between category 1 and journaling and has done a bit more eating out.  Subjective:   1. Other insomnia Courtney [name] notes sleeping 5-6 hours of sleep at night.  She says she wakes early and cannot fall back to sleep.  This has been present for years.  Assessment/Plan:   1. Other insomnia Courtney [name] will discontinue trazodone and start Lunesta, as below. - eszopiclone (LUNESTA) 1 MG TABS tablet; Take 1 tablet (1 mg total) by mouth at bedtime as needed for sleep. Take immediately before bedtime  Dispense: 30 tablet; Refill: 0  2. Class 1 obesity with serious comorbidity and body mass index (BMI) of 30.0 to 30.9 in adult, unspecified obesity type Courtney [name] is currently in the action stage of change. As such, her goal is to continue with weight loss efforts. She has agreed to the Category 1 Plan or keeping a food journal and adhering to recommended goals of 1000-1200 calories and 70+ grams of protein.   Exercise goals: As is.  Behavioral modification strategies: decreasing eating out and meal planning and cooking strategies.  Courtney [name] has agreed to follow-up with our clinic in 4 weeks. She was informed of the importance of frequent follow-up visits to maximize her success with intensive lifestyle modifications for her multiple health conditions.   Objective:   Blood pressure  124/65, pulse (!) 50, temperature 97.8 F (36.6 C), temperature source Oral, height 5' (1.524 m), weight 147 lb (66.7 kg), SpO2 99 %. Body mass index is 28.71 kg/m.  General: Cooperative, alert, well developed, in no acute distress. HEENT: Conjunctivae and lids unremarkable. Cardiovascular: Regular rhythm.  Lungs: Normal work of breathing. Neurologic: No focal deficits.   Lab Results  Component Value Date   CREATININE 0.72 01/25/2019   BUN 21 01/25/2019   NA 141 01/25/2019   K 4.6 01/25/2019   CL 105 01/25/2019   CO2 22 01/25/2019   Lab Results  Component Value Date   ALT 20 01/25/2019   AST 21 01/25/2019   ALKPHOS 66 01/25/2019   BILITOT 0.3 01/25/2019   Lab Results  Component Value Date   HGBA1C 5.4 09/14/2018   HGBA1C 5.6 03/03/2018   HGBA1C 5.6 10/15/2017   HGBA1C 5.6 06/16/2017   Lab Results  Component Value Date   INSULIN 4.2 09/14/2018   INSULIN 5.0 03/03/2018   INSULIN 5.9 10/15/2017   INSULIN 6.4 06/16/2017   Lab Results  Component Value Date   TSH 0.931 06/16/2017   Lab Results  Component Value Date   CHOL 151 01/25/2019   HDL 66 01/25/2019   LDLCALC 72 01/25/2019   TRIG 63 01/25/2019   CHOLHDL 2.5 10/29/2016   Lab Results  Component Value Date   WBC 7.6 01/25/2019   HGB 12.3 01/25/2019   HCT 36.7 01/25/2019   MCV 94 01/25/2019   PLT 201 01/25/2019  Lab Results  Component Value Date   IRON 100 01/25/2019   TIBC 328 01/25/2019   FERRITIN 54 01/25/2019   Attestation Statements:   Reviewed by clinician on day of visit: allergies, medications, problem list, medical history, surgical history, family history, social history, and previous encounter notes.  Time spent on visit including pre-visit chart review and post-visit care was 31 minutes.   I, Water quality scientist, CMA, am acting as transcriptionist for Dennard Nip, MD.  I have reviewed the above documentation for accuracy and completeness, and I agree with the above. -  Dennard Nip,  MD

## 2019-04-14 ENCOUNTER — Encounter (INDEPENDENT_AMBULATORY_CARE_PROVIDER_SITE_OTHER): Payer: Self-pay | Admitting: Family Medicine

## 2019-05-04 ENCOUNTER — Ambulatory Visit
Admission: RE | Admit: 2019-05-04 | Discharge: 2019-05-04 | Disposition: A | Discharge status: Home / Self Care | Payer: Medicare HMO | Source: Ambulatory Visit | Attending: Obstetrics and Gynecology | Admitting: Obstetrics and Gynecology

## 2019-05-04 ENCOUNTER — Other Ambulatory Visit: Payer: Self-pay

## 2019-05-04 ENCOUNTER — Ambulatory Visit: Payer: Medicare HMO

## 2019-05-04 DIAGNOSIS — Z1231 Encounter for screening mammogram for malignant neoplasm of breast: Secondary | ICD-10-CM

## 2019-05-06 ENCOUNTER — Other Ambulatory Visit: Payer: Self-pay

## 2019-05-07 ENCOUNTER — Other Ambulatory Visit: Payer: Medicare HMO

## 2019-05-10 ENCOUNTER — Other Ambulatory Visit: Payer: Self-pay

## 2019-05-10 ENCOUNTER — Ambulatory Visit (INDEPENDENT_AMBULATORY_CARE_PROVIDER_SITE_OTHER): Payer: Medicare HMO | Admitting: Family Medicine

## 2019-05-10 ENCOUNTER — Encounter (INDEPENDENT_AMBULATORY_CARE_PROVIDER_SITE_OTHER): Payer: Self-pay | Admitting: Family Medicine

## 2019-05-10 ENCOUNTER — Other Ambulatory Visit (INDEPENDENT_AMBULATORY_CARE_PROVIDER_SITE_OTHER): Payer: Self-pay | Admitting: Family Medicine

## 2019-05-10 VITALS — BP 104/54 | HR 52 | Temp 97.7°F | Ht 60.0 in | Wt 149.0 lb

## 2019-05-10 DIAGNOSIS — E7849 Other hyperlipidemia: Secondary | ICD-10-CM | POA: Diagnosis not present

## 2019-05-10 DIAGNOSIS — E669 Obesity, unspecified: Secondary | ICD-10-CM | POA: Diagnosis not present

## 2019-05-10 DIAGNOSIS — Z6830 Body mass index (BMI) 30.0-30.9, adult: Secondary | ICD-10-CM | POA: Diagnosis not present

## 2019-05-10 DIAGNOSIS — I10 Essential (primary) hypertension: Secondary | ICD-10-CM

## 2019-05-10 DIAGNOSIS — E559 Vitamin D deficiency, unspecified: Secondary | ICD-10-CM | POA: Diagnosis not present

## 2019-05-10 DIAGNOSIS — G4709 Other insomnia: Secondary | ICD-10-CM | POA: Diagnosis not present

## 2019-05-10 MED ORDER — VITAMIN D (ERGOCALCIFEROL) 1.25 MG (50000 UNIT) PO CAPS: 50000.0000 [IU] | ORAL_CAPSULE | ORAL | 0 refills | Status: DC

## 2019-05-10 NOTE — Progress Notes (Signed)
Chief Complaint:   OBESITY Courtney [name] is here to discuss her progress with her obesity treatment plan along with follow-up of her obesity related diagnoses. Courtney [name] is on the Category 1 Plan or keeping a food journal and adhering to recommended goals of 1000-1200 calories and 70+ grams of protein daily and states she is following her eating plan approximately 80% of the time. Courtney [name] states she is walking for 30 minutes 2 times per week.  Today's visit was #: 40 Starting weight: 157 lbs Starting date: 06/16/2017 Today's weight: 149 lbs Today's date: 05/10/2019 Total lbs lost to date: 8 Total lbs lost since last in-office visit: 0  Interim History: Courtney [name] reports increase in stress eating. Courtney [name] has started [employer] through work and states "im more mindful of my actions and my reactions". She was unable to fill Lunesta prescription due to high cost. She is using OTC sleep aid. She is able to fall asleep and reports early awakening then will be able to fall back asleep, but she states "it takes some time".  Subjective:   1. Vitamin D deficiency Courtney [name] is currently on prescription Vit D supplementation.  2. Essential hypertension Courtney [name] is currently on metoprolol 25 mg 1/2 tablet BID, and Ramipril 10 mg q daily. She has not needed nitroglycerin SL.  3. Other hyperlipidemia Courtney [name] is currently on ezetimibe 10 mg q daily, and rosuvastatin 40 mg q daily. She denies myalgias.  4. Other insomnia Courtney [name] notes early awakening, and she is unable to afford prescription Lunesta. She will check with her insurance regarding sleep study coverage.  Assessment/Plan:   1. Vitamin D deficiency Low Vitamin D level contributes to fatigue and are associated with obesity, breast, and colon cancer. We will refill prescription Vitamin D for 1 month. Courtney [name] will follow-up for routine testing of Vitamin D, at least 2-3 times per year to avoid over-replacement.  - Vitamin D, Ergocalciferol, (DRISDOL) 1.25 MG (50000 UNIT) CAPS  capsule; Take 1 capsule (50,000 Units total) by mouth every 7 (seven) days.  Dispense: 4 capsule; Refill: 0  2. Essential hypertension Courtney [name] will continue her Category 1 meal plan. She will continue to work on healthy weight loss and exercise to improve blood pressure control. We will watch for signs of hypotension as she continues her lifestyle modifications. Courtney [name] will continue her current anti-hypertensive regimen.  3. Other hyperlipidemia Cardiovascular risk and specific lipid/LDL goals reviewed. We discussed several lifestyle modifications today and Courtney [name] will continue her Category 1 meal plan, and will continue to work on exercise and weight loss efforts. Courtney [name] will continue her current statin therapy. Orders and follow up as documented in patient record.   4. Other insomnia The problem of recurrent insomnia was discussed. Courtney [name] will continue OTC sleep aid. Orders and follow up as documented in patient record. Counseling: Intensive lifestyle modifications are the first line treatment for this issue. We discussed several lifestyle modifications today and she will continue to work on diet, exercise and weight loss efforts.   5. Class 1 obesity with serious comorbidity and body mass index (BMI) of 30.0 to 30.9 in adult, unspecified obesity type Courtney [name] is currently in the action stage of change. As such, her goal is to continue with weight loss efforts. She has agreed to the Category 1 Plan.   Exercise goals: As is.  Behavioral modification strategies: meal planning and cooking strategies and emotional eating strategies.  Courtney [name] has agreed to follow-up with our clinic in 4 weeks. She was informed of the importance  of frequent follow-up visits to maximize her success with intensive lifestyle modifications for her multiple health conditions.   Objective:   Blood pressure (!) 104/54, pulse (!) 52, temperature 97.7 F (36.5 C), temperature source Oral, height 5' (1.524 m), weight 149 lb (67.6 kg),  SpO2 99 %. Body mass index is 29.1 kg/m.  General: Cooperative, alert, well developed, in no acute distress. HEENT: Conjunctivae and lids unremarkable. Cardiovascular: Regular rhythm.  Lungs: Normal work of breathing. Neurologic: No focal deficits.   Lab Results  Component Value Date   CREATININE 0.72 01/25/2019   BUN 21 01/25/2019   NA 141 01/25/2019   K 4.6 01/25/2019   CL 105 01/25/2019   CO2 22 01/25/2019   Lab Results  Component Value Date   ALT 20 01/25/2019   AST 21 01/25/2019   ALKPHOS 66 01/25/2019   BILITOT 0.3 01/25/2019   Lab Results  Component Value Date   HGBA1C 5.4 09/14/2018   HGBA1C 5.6 03/03/2018   HGBA1C 5.6 10/15/2017   HGBA1C 5.6 06/16/2017   Lab Results  Component Value Date   INSULIN 4.2 09/14/2018   INSULIN 5.0 03/03/2018   INSULIN 5.9 10/15/2017   INSULIN 6.4 06/16/2017   Lab Results  Component Value Date   TSH 0.931 06/16/2017   Lab Results  Component Value Date   CHOL 151 01/25/2019   HDL 66 01/25/2019   LDLCALC 72 01/25/2019   TRIG 63 01/25/2019   CHOLHDL 2.5 10/29/2016   Lab Results  Component Value Date   WBC 7.6 01/25/2019   HGB 12.3 01/25/2019   HCT 36.7 01/25/2019   MCV 94 01/25/2019   PLT 201 01/25/2019   Lab Results  Component Value Date   IRON 100 01/25/2019   TIBC 328 01/25/2019   FERRITIN 54 01/25/2019    Obesity Behavioral Intervention Documentation for Insurance:   Approximately 15 minutes were spent on the discussion below.  ASK: We discussed the diagnosis of obesity with Courtney [name] today and Courtney [name] agreed to give [place] permission to discuss obesity behavioral modification therapy today.  ASSESS: Courtney [name] has the diagnosis of obesity and her BMI today is 29.1. Courtney [name] is in the action stage of change.   ADVISE: Courtney [name] was educated on the multiple health risks of obesity as well as the benefit of weight loss to improve her health. She was advised of the need for long term treatment and the importance of lifestyle  modifications to improve her current health and to decrease her risk of future health problems.  AGREE: Multiple dietary modification options and treatment options were discussed and Courtney [name] agreed to follow the recommendations documented in the above note.  ARRANGE: Courtney [name] was educated on the importance of frequent visits to treat obesity as outlined per CMS and USPSTF guidelines and agreed to schedule her next follow up appointment today.  Attestation Statements:   Reviewed by clinician on day of visit: allergies, medications, problem list, medical history, surgical history, family history, social history, and previous encounter notes.   I, Trixie Dredge, am acting as transcriptionist for Dennard Nip, MD.  I have reviewed the above documentation for accuracy and completeness, and I agree with the above. -  Dennard Nip, MD

## 2019-05-14 ENCOUNTER — Other Ambulatory Visit: Payer: Self-pay

## 2019-05-14 ENCOUNTER — Ambulatory Visit (INDEPENDENT_AMBULATORY_CARE_PROVIDER_SITE_OTHER): Payer: Medicare HMO | Admitting: Family

## 2019-05-14 ENCOUNTER — Encounter: Payer: Self-pay | Admitting: Family

## 2019-05-14 VITALS — BP 128/70 | HR 51 | Temp 98.1°F | Ht 60.0 in | Wt 152.4 lb

## 2019-05-14 DIAGNOSIS — G4709 Other insomnia: Secondary | ICD-10-CM | POA: Diagnosis not present

## 2019-05-14 DIAGNOSIS — K2100 Gastro-esophageal reflux disease with esophagitis, without bleeding: Secondary | ICD-10-CM | POA: Diagnosis not present

## 2019-05-14 MED ORDER — PANTOPRAZOLE SODIUM 40 MG PO TBEC: 40.0000 mg | DELAYED_RELEASE_TABLET | Freq: Two times a day (BID) | ORAL | 3 refills | Status: DC

## 2019-05-14 MED ORDER — DOXEPIN HCL 10 MG PO CAPS: 10.0000 mg | ORAL_CAPSULE | Freq: Every evening | ORAL | 0 refills | Status: DC | PRN

## 2019-05-14 NOTE — Progress Notes (Signed)
Courtney [name] is a 69 y.o. female with the following history as recorded in EpicCare:  Patient Active Problem List   Diagnosis Date Noted  . Vitamin D deficiency, Vit D = 43.2 (01/25/19), Rx vit D 50K IU every 2 weeks 08/04/2018  . Cyst of right breast 05/07/2018  . Chronic headache disorder 02/08/2018  . Insomnia 07/15/2017  . Female stress incontinence 07/15/2017  . Gastroesophageal reflux disease with esophagitis, Rx Protonix 02/25/2017  . Osteopenia 06/27/2015  . Osteoarthritis of right hip 07/08/2014  . Carotid artery disease ([place])   . Aortic valve sclerosis   . Mixed hyperlipidemia, Rx Crestor and Zetia   . Coronary artery disease     Current Outpatient Medications  Medication Sig Dispense Refill  . acetaminophen (TYLENOL) 500 MG tablet Take 1,000 mg by mouth every 6 (six) hours as needed (Pain).     [name] aspirin 81 MG tablet Take 81 mg by mouth daily.    [name] dicyclomine (BENTYL) 20 MG tablet Take 1 tablet (20 mg total) by mouth every 6 (six) hours. 120 tablet 1  . ezetimibe (ZETIA) 10 MG tablet Take 1 tablet (10 mg total) by mouth daily. 90 tablet 3  . metoprolol tartrate (LOPRESSOR) 25 MG tablet Take 0.5 tablets (12.5 mg total) by mouth 2 (two) times daily. 90 tablet 3  . nitroGLYCERIN (NITROSTAT) 0.4 MG SL tablet Place 1 tablet (0.4 mg total) under the tongue every 5 (five) minutes as needed for chest pain. 25 tablet 11  . pantoprazole (PROTONIX) 40 MG tablet Take 1 tablet (40 mg total) by mouth 2 (two) times daily. 180 tablet 3  . ramipril (ALTACE) 10 MG capsule Take 1 capsule (10 mg total) by mouth daily. 90 capsule 3  . rosuvastatin (CRESTOR) 40 MG tablet Take 1 tablet (40 mg total) by mouth daily. 90 tablet 3  . Vitamin D, Ergocalciferol, (DRISDOL) 1.25 MG (50000 UNIT) CAPS capsule Take 1 capsule (50,000 Units total) by mouth every 7 (seven) days. 4 capsule 0  . doxepin (SINEQUAN) 10 MG capsule Take 1 capsule (10 mg total) by mouth at bedtime as needed. 30 capsule 0   No  current facility-administered medications for this visit.    Allergies: Erythromycin, Penicillins, and Shellfish allergy  Past Medical History:  Diagnosis Date  . Aortic valve sclerosis    echo 16/1096, soft systolic murmur  . 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 Back pain   . Carotid artery disease ([place])    40-59% bilateral, doppler December 2010  . Constipation   . Coronary artery disease    a. BMS-mid RCA 11/2007 b. stress echo 02/2009 subtle inferior HK, lateral ST depressions with stress c. follow-up cath 02/2009: RCA stent patent, severe but stable stenosis of distal posterior lateral branch RCA. LV normal, med tx unless more sx c. ETT Myoview 08/27/12: negative for ischemia; EF 64%  . Decreased hearing   . Depression    Mild, August, 2012  . Dyslipidemia   . Easy bruising   . Ejection fraction    EF normal, catheterization, 2011 /  EF 55%, echo, 2010  . Fluid overload    Mild, stable since hospitalization in the past  . GERD (gastroesophageal reflux disease)    occarionally, takes Pepcid over the counter  . Heart murmur    has, occasional PVC's  . History of right hip replacement   . Hyperlipidemia   . Hypertension   . Joint pain   . Myocardial infarction ([place])  2009  . Palpitations   . PVC (premature ventricular contraction)    per prior Holter monitor  . Right hip pain 12/2009  . Shellfish allergy   . Trouble in sleeping   . Vitamin B12 deficiency   . Vitamin D deficiency     Past Surgical History:  Procedure Laterality Date  . 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/01/2005  . BREAST BIOPSY Left   . carcinoid tumor removal    . CARDIAC CATHETERIZATION  1/211   Patent RCA stent, stenotic PLB supplying small distribution; medically managed  . CATARACT EXTRACTION, BILATERAL    . CORONARY ANGIOPLASTY WITH STENT PLACEMENT  11/2007   BMS-mid RCA  . DIAGNOSTIC LAPAROSCOPY     1982 for endometriosis  . DILATION AND CURETTAGE OF UTERUS  2005  . hx of wisdom teeth surgery    . SKIN  CANCER EXCISION    . TOTAL HIP ARTHROPLASTY Right 07/08/2014   Procedure: RIGHT TOTAL HIP ARTHROPLASTY ANTERIOR APPROACH;  Surgeon: Mcarthur Rossetti, MD;  Location: [facility];  Service: Orthopedics;  Laterality: Right;    Family History  Problem Relation Age of Onset  . Heart attack Father 54  . Hyperlipidemia Father   . Hypertension Father   . Sudden death Father   . Stroke Mother   . Hyperlipidemia Mother   . Hypertension Mother   . Heart disease Mother   . Obesity Mother   . Heart attack Paternal Grandfather   . Heart disease Sister   . BRCA 1/2 Neg Hx   . Breast cancer Neg Hx     Social History   Tobacco Use  . Smoking status: Never Smoker  . Smokeless tobacco: Never Used  Substance Use Topics  . Alcohol use: Yes    Alcohol/week: 7.0 standard drinks    Types: 7 Glasses of wine per week    Comment: 1 glass of wine/night    Subjective:  Patient presents with concerns for chronic GERD/ insomnia;  1) Has been to GI and opted against endoscopy at this time; did not feel that Dexilant was more beneficial and very expensive; wonders about other options for treatment; 2) Chronic insomnia x 2-3 years- has tried OTC teas, melatonin; has tried Trazodone but did not like how she felt on this medication; has tried [place] but could not afford;     Objective:  Vitals:   05/14/19 1014  BP: 128/70  Pulse: (!) 51  Temp: 98.1 F (36.7 C)  TempSrc: Oral  SpO2: 97%  Weight: 152 lb 6.4 oz (69.1 kg)  Height: 5' (1.524 m)    General: Well developed, well nourished, in no acute distress  Skin : Warm and dry.  Head: Normocephalic and atraumatic  Eyes: Sclera and conjunctiva clear; pupils round and reactive to light; extraocular movements intact  Ears: External normal; canals clear; tympanic membranes normal  Oropharynx: Pink, supple. No suspicious lesions  Neck: Supple without thyromegaly, adenopathy  Lungs: Respirations unlabored; clear to auscultation bilaterally without  wheeze, rales, rhonchi  CVS exam: normal rate and regular rhythm. Murmur noted  Neurologic: Alert and oriented; speech intact; face symmetrical; moves all extremities well; CNII-XII intact without focal deficit   Assessment:  1. Gastroesophageal reflux disease with esophagitis, unspecified whether hemorrhage   2. Other insomnia     Plan:  1. Try increasing to Protonix to bid prn; if symptoms persist, will need to see GI in follow-up to discuss endoscopy; 2. Trial of Doxepin 10 mg qhs; if no relief, could consider Ambien,  Temazepam or Seroquel; follow-up to be determined;   This visit occurred during the SARS-CoV-2 public health emergency.  Safety protocols were in place, including screening questions prior to the visit, additional usage of staff PPE, and extensive cleaning of exam room while observing appropriate contact time as indicated for disinfecting solutions.     No follow-ups on file.  No orders of the defined types were placed in this encounter.   Requested Prescriptions   Signed Prescriptions Disp Refills  . pantoprazole (PROTONIX) 40 MG tablet 180 tablet 3    Sig: Take 1 tablet (40 mg total) by mouth 2 (two) times daily.  [name] doxepin (SINEQUAN) 10 MG capsule 30 capsule 0    Sig: Take 1 capsule (10 mg total) by mouth at bedtime as needed.

## 2019-05-24 ENCOUNTER — Telehealth: Payer: Self-pay

## 2019-05-24 NOTE — Telephone Encounter (Signed)
-----   Message from Levin Erp, [place] sent at 05/24/2019  8:35 AM EDT ----- Can you call and schedule for follow up OV with me. Thanks-JLL

## 2019-05-24 NOTE — Telephone Encounter (Signed)
4/13 at 2 pm appt with Anderson Malta the pt has been advised

## 2019-05-26 ENCOUNTER — Ambulatory Visit (INDEPENDENT_AMBULATORY_CARE_PROVIDER_SITE_OTHER): Payer: Medicare HMO | Admitting: Family Medicine

## 2019-06-01 ENCOUNTER — Encounter: Payer: Self-pay | Admitting: Physician Assistant

## 2019-06-01 ENCOUNTER — Ambulatory Visit: Payer: Medicare HMO | Admitting: Physician Assistant

## 2019-06-01 VITALS — BP 140/58 | HR 56 | Temp 97.9°F | Ht 60.0 in | Wt 152.2 lb

## 2019-06-01 DIAGNOSIS — R0789 Other chest pain: Secondary | ICD-10-CM | POA: Diagnosis not present

## 2019-06-01 DIAGNOSIS — R0989 Other specified symptoms and signs involving the circulatory and respiratory systems: Secondary | ICD-10-CM | POA: Diagnosis not present

## 2019-06-01 NOTE — Progress Notes (Signed)
Chief Complaint: Atypical chest pain, globus sensation  HPI:    [name] is a 69 year old Caucasian female, assigned to Courtney [name], with a past medical history as listed below including aortic valve sclerosis, CAD and others, who returns to clinic today for follow-up of her epigastric pain and complaints of atypical chest pain and globus sensation.     Per chart review patient was previously seen by Courtney [name] at [facility].  02/22/2013 colonoscopy for hematochezia with internal hemorrhoids as well as a sessile polyp in the ascending colon.  Pathology showed benign polypoid mucosa.  Repeat recommended in 10 years.    02/23/2019 patient saw her physician in regards to weight and wellness, she has lost 11 pounds over the past year but at that time complained of some epigastric gnawing pain as well as an increase in IBS symptoms.    03/01/2019 patient seen in clinic and described irritable bowel syndrome which is worse about 3 times a year as well as an epigastric pain.  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  She was told to repeat in 10 years.  At that visit discussed EGD but she declined.  She was assigned to Courtney [name].  Continued on Pantoprazole 40 twice daily and added fiber.    Today, the patient tells me that she continues with a "substernal" pain which occurs right after she eats only certain foods.  Seems to be within minutes and she feels a tightness like her bra is too tight and feels like she has to move around in order to release pressure.  Has tried taking Pepcid but this does not help.  Does take a Bentyl and sometimes this works.  This can last for about an hour or 2.    Also describes a sensation of something being in the back of her throat when she lays down to sleep.    Denies fever, chills, heartburn, reflux or dysphagia.  Past Medical History:  Diagnosis Date  . Aortic valve sclerosis    echo 00/9233, soft  systolic murmur  . 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 Back pain   . Carotid artery disease ([place])    40-59% bilateral, doppler December 2010  . Constipation   . Coronary artery disease    a. BMS-mid RCA 11/2007 b. stress echo 02/2009 subtle inferior HK, lateral ST depressions with stress c. follow-up cath 02/2009: RCA stent patent, severe but stable stenosis of distal posterior lateral branch RCA. LV normal, med tx unless more sx c. ETT Myoview 08/27/12: negative for ischemia; EF 64%  . Decreased hearing   . Depression    Mild, August, 2012  . Dyslipidemia   . Easy bruising   . Ejection fraction    EF normal, catheterization, 2011 /  EF 55%, echo, 2010  . Fluid overload    Mild, stable since hospitalization in the past  . GERD (gastroesophageal reflux disease)    occarionally, takes Pepcid over the counter  . Heart murmur    has, occasional PVC's  . History of right hip replacement   . Hyperlipidemia   . Hypertension   . Joint pain   . Myocardial infarction ([place])    2009  . Palpitations   . PVC (premature ventricular contraction)    per prior Holter monitor  . Right hip pain 12/2009  . Shellfish allergy   . Trouble in sleeping   . Vitamin  B12 deficiency   . Vitamin D deficiency     Past Surgical History:  Procedure Laterality Date  . 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/01/2005  . BREAST BIOPSY Left   . carcinoid tumor removal     colon-  . CARDIAC CATHETERIZATION  1/211   Patent RCA stent, stenotic PLB supplying small distribution; medically managed  . CATARACT EXTRACTION, BILATERAL    . CORONARY ANGIOPLASTY WITH STENT PLACEMENT  11/2007   BMS-mid RCA  . DIAGNOSTIC LAPAROSCOPY     1982 for endometriosis  . DILATION AND CURETTAGE OF UTERUS  2005  . SKIN CANCER EXCISION    . TOTAL HIP ARTHROPLASTY Right 07/08/2014   Procedure: RIGHT TOTAL HIP ARTHROPLASTY ANTERIOR APPROACH;  Surgeon: Courtney Y Blackman, MD;  Location: [facility];  Service: Orthopedics;  Laterality: Right;  . WISDOM TOOTH  EXTRACTION      Current Outpatient Medications  Medication Sig Dispense Refill  . acetaminophen (TYLENOL) 500 MG tablet Take 1,000 mg by mouth every 6 (six) hours as needed (Pain).     . aspirin 81 MG tablet Take 81 mg by mouth daily.    . dicyclomine (BENTYL) 20 MG tablet Take 1 tablet (20 mg total) by mouth every 6 (six) hours. 120 tablet 1  . doxepin (SINEQUAN) 10 MG capsule Take 1 capsule (10 mg total) by mouth at bedtime as needed. 30 capsule 0  . ezetimibe (ZETIA) 10 MG tablet Take 1 tablet (10 mg total) by mouth daily. 90 tablet 3  . metoprolol tartrate (LOPRESSOR) 25 MG tablet Take 0.5 tablets (12.5 mg total) by mouth 2 (two) times daily. 90 tablet 3  . nitroGLYCERIN (NITROSTAT) 0.4 MG SL tablet Place 1 tablet (0.4 mg total) under the tongue every 5 (five) minutes as needed for chest pain. 25 tablet 11  . pantoprazole (PROTONIX) 40 MG tablet Take 1 tablet (40 mg total) by mouth 2 (two) times daily. 180 tablet 3  . ramipril (ALTACE) 10 MG capsule Take 1 capsule (10 mg total) by mouth daily. 90 capsule 3  . rosuvastatin (CRESTOR) 40 MG tablet Take 1 tablet (40 mg total) by mouth daily. 90 tablet 3  . Vitamin D, Ergocalciferol, (DRISDOL) 1.25 MG (50000 UNIT) CAPS capsule Take 1 capsule (50,000 Units total) by mouth every 7 (seven) days. 4 capsule 0   No current facility-administered medications for this visit.    Allergies as of 06/01/2019 - Review Complete 06/01/2019  Allergen Reaction Noted  . Erythromycin Hives   . Penicillins Hives   . Shellfish allergy Hives 11/10/2014    Family History  Problem Relation Age of Onset  . Heart attack Father 70  . Hyperlipidemia Father   . Hypertension Father   . Sudden death Father   . Stroke Mother   . Hyperlipidemia Mother   . Hypertension Mother   . Heart disease Mother   . Obesity Mother   . Heart attack Paternal Grandfather   . Heart disease Sister   . BRCA 1/2 Neg Hx   . Breast cancer Neg Hx   . Esophageal cancer Neg Hx   .  Colon cancer Neg Hx   . Pancreatic cancer Neg Hx   . Liver disease Neg Hx   . Stomach cancer Neg Hx     Social History   Socioeconomic History  . Marital status: Divorced    Spouse name: Not on file  . Number of children: 1  . Years of education: 18  . Highest education level: Not on file    Occupational History  . Occupation: Retired - Nursing Director  Tobacco Use  . Smoking status: Never Smoker  . Smokeless tobacco: Never Used  Substance and Sexual Activity  . Alcohol use: Yes    Alcohol/week: 7.0 standard drinks    Types: 7 Glasses of wine per week    Comment: 1 glass of wine/night  . Drug use: No  . Sexual activity: Yes  Other Topics Concern  . Not on file  Social History Narrative   Fun: Dance, garden,    Denies religious beliefs effecting health care.   Denies abuse and feels safe at home   Social Determinants of Health   Financial Resource Strain:   . Difficulty of Paying Living Expenses:   Food Insecurity:   . Worried About Running Out of Food in the Last Year:   . Ran Out of Food in the Last Year:   Transportation Needs:   . Lack of Transportation (Medical):   . Lack of Transportation (Non-Medical):   Physical Activity:   . Days of Exercise per Week:   . Minutes of Exercise per Session:   Stress:   . Feeling of Stress :   Social Connections:   . Frequency of Communication with Friends and Family:   . Frequency of Social Gatherings with Friends and Family:   . Attends Religious Services:   . Active Member of Clubs or Organizations:   . Attends Club or Organization Meetings:   . Marital Status:   Intimate Partner Violence:   . Fear of Current or Ex-Partner:   . Emotionally Abused:   . Physically Abused:   . Sexually Abused:     Review of Systems:    Constitutional: No weight loss, fever or chills Cardiovascular: No palpitations Respiratory: No SOB  Gastrointestinal: See HPI and otherwise negative   Physical Exam:  Vital signs: BP (!) 140/58    Pulse (!) 56   Temp 97.9 F (36.6 C)   Ht 5' (1.524 m)   Wt 152 lb 3.2 oz (69 kg)   BMI 29.72 kg/m   Constitutional:   Pleasant Elderly Caucasian female appears to be in NAD, Well developed, Well nourished, alert and cooperative Respiratory: Respirations even and unlabored. Lungs clear to auscultation bilaterally.   No wheezes, crackles, or rhonchi.  Cardiovascular: Normal S1, S2. No MRG. Regular rate and rhythm. No peripheral edema, cyanosis or pallor.  Gastrointestinal:  Soft, nondistended, nontender. No rebound or guarding. Normal bowel sounds. No appreciable masses or hepatomegaly. Rectal:  Not performed.  Psychiatric: Demonstrates good judgement and reason without abnormal affect or behaviors.  MOST RECENT LABS AND IMAGING: CBC    Component Value Date/Time   WBC 7.6 01/25/2019 0853   WBC 12.2 (H) 10/14/2017 1923   RBC 3.91 01/25/2019 0853   RBC 4.21 10/14/2017 1923   HGB 12.3 01/25/2019 0853   HCT 36.7 01/25/2019 0853   PLT 201 01/25/2019 0853   MCV 94 01/25/2019 0853   MCH 31.5 01/25/2019 0853   MCH 31.8 10/14/2017 1923   MCHC 33.5 01/25/2019 0853   MCHC 33.8 10/14/2017 1923   RDW 12.3 01/25/2019 0853   LYMPHSABS 3.1 01/25/2019 0853   MONOABS 0.8 10/14/2017 1923   EOSABS 0.3 01/25/2019 0853   BASOSABS 0.1 01/25/2019 0853    CMP     Component Value Date/Time   NA 141 01/25/2019 0853   K 4.6 01/25/2019 0853   CL 105 01/25/2019 0853   CO2 22 01/25/2019 0853   GLUCOSE 104 (H) 01/25/2019   0853   GLUCOSE 92 10/14/2017 1923   BUN 21 01/25/2019 0853   CREATININE 0.72 01/25/2019 0853   CALCIUM 9.7 01/25/2019 0853   PROT 6.7 01/25/2019 0853   ALBUMIN 4.7 01/25/2019 0853   AST 21 01/25/2019 0853   ALT 20 01/25/2019 0853   ALKPHOS 66 01/25/2019 0853   BILITOT 0.3 01/25/2019 0853   GFRNONAA 85 01/25/2019 0853   GFRAA 98 01/25/2019 0853    Assessment: 1.  Atypical chest pain: Within minutes after eating that can last for a couple of hours, Bentyl seems to help;  consider reflux versus esophageal spasm versus other 2.  Globus sensation: Feels like something in her throat when she lays down; consider GERD versus esophagitis or stricture versus other  Plan: 1.  Scheduled patient for an EGD in the [facility].  Did discuss risks, benefits, limitations and alternatives.  The patient has had both of her Covid vaccines, the last one the end of January.  This was scheduled with Courtney [name]. 2.  Continue Pantoprazole 40 mg twice daily. 3.  Discussed adding in Pepcid.  Patient tells me she already does this as needed. 4.  Continue Bentyl as needed for pain. 5.  Patient to follow in clinic per recommendations from Courtney [name] after time of procedure.  Ellouise Newer, PA-C [facility] 06/01/2019, 1:51 PM  Cc: Marrian Salvage,*

## 2019-06-01 NOTE — Patient Instructions (Signed)
If you are age 71 or older, your body mass index should be between 23-30. Your Body mass index is 29.72 kg/m. If this is out of the aforementioned range listed, please consider follow up with your Primary Care Provider.  If you are age 27 or younger, your body mass index should be between 19-25. Your Body mass index is 29.72 kg/m. If this is out of the aformentioned range listed, please consider follow up with your Primary Care Provider.    Continue taking your pantoprazole  Due to recent changes in healthcare laws, you may see the results of your imaging and laboratory studies on MyChart before your provider has had a chance to review them.  We understand that in some cases there may be results that are confusing or concerning to you. Not all laboratory results come back in the same time frame and the provider may be waiting for multiple results in order to interpret others.  Please give [place] 48 hours in order for your provider to thoroughly review all the results before contacting the office for clarification of your results.   Thank you for choosing me and [facility]

## 2019-06-02 ENCOUNTER — Ambulatory Visit (AMBULATORY_SURGERY_CENTER): Payer: Medicare HMO | Admitting: Internal Medicine

## 2019-06-02 ENCOUNTER — Other Ambulatory Visit: Payer: Self-pay

## 2019-06-02 ENCOUNTER — Encounter: Payer: Self-pay | Admitting: Internal Medicine

## 2019-06-02 VITALS — BP 95/54 | HR 50 | Temp 96.2°F | Resp 17 | Ht 60.0 in | Wt 152.0 lb

## 2019-06-02 DIAGNOSIS — R0789 Other chest pain: Secondary | ICD-10-CM

## 2019-06-02 DIAGNOSIS — Z1211 Encounter for screening for malignant neoplasm of colon: Secondary | ICD-10-CM | POA: Diagnosis not present

## 2019-06-02 MED ORDER — SODIUM CHLORIDE 0.9 % IV SOLN: 500.0000 mL | INTRAVENOUS | Status: DC

## 2019-06-02 MED ORDER — PANTOPRAZOLE SODIUM 40 MG PO TBEC: 40.0000 mg | DELAYED_RELEASE_TABLET | Freq: Every day | ORAL | 3 refills | Status: DC

## 2019-06-02 NOTE — Progress Notes (Signed)
Temp by JB and vitals by DT 

## 2019-06-02 NOTE — Progress Notes (Signed)
To PACU, VSS. Report to [email or web address] 

## 2019-06-02 NOTE — Op Note (Signed)
[facility] Patient Name: Courtney [name] Procedure Date: 06/02/2019 1:23 PM MRN: [id number] Endoscopist: Gatha Mayer , MD Age: 69 Referring MD:  Date of Birth: 15-Oct-1950 Gender: Female Account #: [id number] Procedure:                Upper GI endoscopy Indications:              Unexplained chest pain Medicines:                Propofol per Anesthesia, Monitored Anesthesia Care Procedure:                Pre-Anesthesia Assessment:                           - Prior to the procedure, a History and Physical                            was performed, and patient medications and                            allergies were reviewed. The patient's tolerance of                            previous anesthesia was also reviewed. The risks                            and benefits of the procedure and the sedation                            options and risks were discussed with the patient.                            All questions were answered, and informed consent                            was obtained. Prior Anticoagulants: The patient has                            taken no previous anticoagulant or antiplatelet                            agents. ASA Grade Assessment: II - A patient with                            mild systemic disease. After reviewing the risks                            and benefits, the patient was deemed in                            satisfactory condition to undergo the procedure.                           After obtaining informed consent, the endoscope was  passed under direct vision. Throughout the                            procedure, the patient's blood pressure, pulse, and                            oxygen saturations were monitored continuously. The                            Endoscope was introduced through the mouth, and                            advanced to the second part of duodenum. The upper                            GI  endoscopy was accomplished without difficulty.                            The patient tolerated the procedure well. Scope In: Scope Out: Findings:                 The esophagus was normal.                           The stomach was normal.                           The examined duodenum was normal. Complications:            No immediate complications. Estimated Blood Loss:     Estimated blood loss: none. Impression:               - Normal esophagus.                           - Normal stomach.                           - Normal examined duodenum.                           - No specimens collected. Recommendation:           - Patient has a contact number available for                            emergencies. The signs and symptoms of potential                            delayed complications were discussed with the                            patient. Return to normal activities tomorrow.                            Written discharge instructions were provided to the  patient.                           - Resume previous diet.                           - Continue present medications.                           -                           REDUCE PANTOPRAZOLE TO ONCE A DAY                           MY OFFICE WILL ARRANGE FOR A RUQ ULTRASOUND DX                            CHEST PAIN EPIGASTRIC PAIN Gatha Mayer, MD 06/02/2019 1:56:08 PM This report has been signed electronically.

## 2019-06-02 NOTE — Patient Instructions (Addendum)
This test was normal.  I think we should look for gallstones and my office will contact you about an ultrasound of the gallbladder. If there are stones will refer to a surgeon to discuss possible gallbladder removal.  I do not think this is from acid reflux. I want you to reduce the pantoprazole to once a day in the AM.  I appreciate the opportunity to care for you. Gatha Mayer, MD, [facility]  Continue present medications.    YOU HAD AN ENDOSCOPIC PROCEDURE TODAY AT [facility]:   Refer to the procedure report that was given to you for any specific questions about what was found during the examination.  If the procedure report does not answer your questions, please call your gastroenterologist to clarify.  If you requested that your care partner not be given the details of your procedure findings, then the procedure report has been included in a sealed envelope for you to review at your convenience later.  YOU SHOULD EXPECT: Some feelings of bloating in the abdomen. Passage of more gas than usual.  Walking can help get rid of the air that was put into your GI tract during the procedure and reduce the bloating. If you had a lower endoscopy (such as a colonoscopy or flexible sigmoidoscopy) you may notice spotting of blood in your stool or on the toilet paper. If you underwent a bowel prep for your procedure, you may not have a normal bowel movement for a few days.  Please Note:  You might notice some irritation and congestion in your nose or some drainage.  This is from the oxygen used during your procedure.  There is no need for concern and it should clear up in a day or so.  SYMPTOMS TO REPORT IMMEDIATELY:    Following upper endoscopy (EGD)  Vomiting of blood or coffee ground material  New chest pain or pain under the shoulder blades  Painful or persistently difficult swallowing  New shortness of breath  Fever of 100F or higher  Black, tarry-looking stools  For  urgent or emergent issues, a gastroenterologist can be reached at any hour by calling [phone]. Do not use MyChart messaging for urgent concerns.    DIET:  We do recommend a small meal at first, but then you may proceed to your regular diet.  Drink plenty of fluids but you should avoid alcoholic beverages for 24 hours.  ACTIVITY:  You should plan to take it easy for the rest of today and you should NOT DRIVE or use heavy machinery until tomorrow (because of the sedation medicines used during the test).    FOLLOW UP: Our staff will call the number listed on your records 48-72 hours following your procedure to check on you and address any questions or concerns that you may have regarding the information given to you following your procedure. If we do not reach you, we will leave a message.  We will attempt to reach you two times.  During this call, we will ask if you have developed any symptoms of COVID 19. If you develop any symptoms (ie: fever, flu-like symptoms, shortness of breath, cough etc.) before then, please call [phone].  If you test positive for Covid 19 in the 2 weeks post procedure, please call and report this information to [place].    If any biopsies were taken you will be contacted by phone or by letter within the next 1-3 weeks.  Please call us at [phone] if  you have not heard about the biopsies in 3 weeks.    SIGNATURES/CONFIDENTIALITY: You and/or your care partner have signed paperwork which will be entered into your electronic medical record.  These signatures attest to the fact that that the information above on your After Visit Summary has been reviewed and is understood.  Full responsibility of the confidentiality of this discharge information lies with you and/or your care-partner.

## 2019-06-03 ENCOUNTER — Telehealth: Payer: Self-pay

## 2019-06-03 DIAGNOSIS — R1013 Epigastric pain: Secondary | ICD-10-CM

## 2019-06-03 DIAGNOSIS — R0789 Other chest pain: Secondary | ICD-10-CM

## 2019-06-03 NOTE — Telephone Encounter (Signed)
RUQ [place] scheduled for 06/08/19 9:30 [place].  She is asked to be NPO after midnight and arrive in radiology at 9:15

## 2019-06-04 ENCOUNTER — Telehealth: Payer: Self-pay

## 2019-06-04 ENCOUNTER — Telehealth: Payer: Self-pay | Admitting: *Deleted

## 2019-06-04 NOTE — Telephone Encounter (Signed)
First attempt, left VM.  

## 2019-06-04 NOTE — Telephone Encounter (Signed)
  Follow up Call-  Call back number 06/02/2019  Post procedure Call Back phone  # [phone]  Permission to leave phone message Yes  Some recent data might be hidden     Patient questions:  Do you have a fever, pain , or abdominal swelling? No. Pain Score  0 *  Have you tolerated food without any problems? Yes.    Have you been able to return to your normal activities? Yes.    Do you have any questions about your discharge instructions: Diet   No. Medications  No. Follow up visit  No.  Do you have questions or concerns about your Care? No.  Actions: * If pain score is 4 or above: No action needed, pain <4. 1. Have you developed a fever since your procedure? no  2.   Have you had an respiratory symptoms (SOB or cough) since your procedure? no  3.   Have you tested positive for COVID 19 since your procedure no  4.   Have you had any family members/close contacts diagnosed with the COVID 19 since your procedure?  no   If yes to any of these questions please route to [name] John, RN and Erenest Rasher, RN

## 2019-06-07 ENCOUNTER — Ambulatory Visit (INDEPENDENT_AMBULATORY_CARE_PROVIDER_SITE_OTHER): Payer: Medicare HMO | Admitting: Family Medicine

## 2019-06-07 ENCOUNTER — Encounter (INDEPENDENT_AMBULATORY_CARE_PROVIDER_SITE_OTHER): Payer: Self-pay | Admitting: Family Medicine

## 2019-06-07 ENCOUNTER — Other Ambulatory Visit: Payer: Self-pay

## 2019-06-07 VITALS — BP 117/53 | HR 48 | Temp 98.0°F | Ht 60.0 in | Wt 149.0 lb

## 2019-06-07 DIAGNOSIS — E669 Obesity, unspecified: Secondary | ICD-10-CM | POA: Diagnosis not present

## 2019-06-07 DIAGNOSIS — Z6830 Body mass index (BMI) 30.0-30.9, adult: Secondary | ICD-10-CM

## 2019-06-07 DIAGNOSIS — R1013 Epigastric pain: Secondary | ICD-10-CM | POA: Diagnosis not present

## 2019-06-07 NOTE — Progress Notes (Signed)
Chief Complaint:   OBESITY Courtney [name] is here to discuss her progress with her obesity treatment plan along with follow-up of her obesity related diagnoses. Courtney [name] is on the Category 1 Plan and states she is following her eating plan approximately 90% of the time. Courtney [name] states she is walking for 45 minutes 4 times per week.  Today's visit was #: 55 Starting weight: 157 lbs Starting date: 06/16/2017 Today's weight: 149 lbs Today's date: 06/07/2019 Total lbs lost to date: 8 Total lbs lost since last in-office visit: 0  Interim History: Courtney [name] continues to do well maintaining her weight on her Category 1 plan. Her RMR is unusually low, making further weight loss difficult.  Subjective:   1. Dyspepsia Courtney [name] had an EGD which was normal and she notes GERD symptoms recently. She is followed by GI.  Assessment/Plan:   1. Dyspepsia Courtney [name] is to continue working on diet as we discussed today, and keep [place] updated with her workup results.  2. Class 1 obesity with serious comorbidity and body mass index (BMI) of 30.0 to 30.9 in adult, unspecified obesity type Courtney [name] is currently in the action stage of change. As such, her goal is to continue with weight loss efforts. She has agreed to the Category 1 Plan.   Exercise goals: Courtney [name] is to increase strengthening to help increase her RMR.  Behavioral modification strategies: increasing lean protein intake and meal planning and cooking strategies.  Courtney [name] has agreed to follow-up with our clinic in 4 weeks. She was informed of the importance of frequent follow-up visits to maximize her success with intensive lifestyle modifications for her multiple health conditions.   Objective:   Blood pressure (!) 117/53, pulse (!) 48, temperature 98 F (36.7 C), temperature source Oral, height 5' (1.524 m), weight 149 lb (67.6 kg), SpO2 100 %. Body mass index is 29.1 kg/m.  General: Cooperative, alert, well developed, in no acute distress. HEENT: Conjunctivae  and lids unremarkable. Cardiovascular: Regular rhythm.  Lungs: Normal work of breathing. Neurologic: No focal deficits.   Lab Results  Component Value Date   CREATININE 0.72 01/25/2019   BUN 21 01/25/2019   NA 141 01/25/2019   K 4.6 01/25/2019   CL 105 01/25/2019   CO2 22 01/25/2019   Lab Results  Component Value Date   ALT 20 01/25/2019   AST 21 01/25/2019   ALKPHOS 66 01/25/2019   BILITOT 0.3 01/25/2019   Lab Results  Component Value Date   HGBA1C 5.4 09/14/2018   HGBA1C 5.6 03/03/2018   HGBA1C 5.6 10/15/2017   HGBA1C 5.6 06/16/2017   Lab Results  Component Value Date   INSULIN 4.2 09/14/2018   INSULIN 5.0 03/03/2018   INSULIN 5.9 10/15/2017   INSULIN 6.4 06/16/2017   Lab Results  Component Value Date   TSH 0.931 06/16/2017   Lab Results  Component Value Date   CHOL 151 01/25/2019   HDL 66 01/25/2019   LDLCALC 72 01/25/2019   TRIG 63 01/25/2019   CHOLHDL 2.5 10/29/2016   Lab Results  Component Value Date   WBC 7.6 01/25/2019   HGB 12.3 01/25/2019   HCT 36.7 01/25/2019   MCV 94 01/25/2019   PLT 201 01/25/2019   Lab Results  Component Value Date   IRON 100 01/25/2019   TIBC 328 01/25/2019   FERRITIN 54 01/25/2019   Attestation Statements:   Reviewed by clinician on day of visit: allergies, medications, problem list, medical history, surgical history, family history, social history, and previous  encounter notes.  Time spent on visit including pre-visit chart review and post-visit care and charting was 22 minutes.    I, Trixie Dredge, am acting as transcriptionist for Dennard Nip, MD.  I have reviewed the above documentation for accuracy and completeness, and I agree with the above. -  Dennard Nip, MD

## 2019-06-08 ENCOUNTER — Ambulatory Visit (HOSPITAL_COMMUNITY)
Admission: RE | Admit: 2019-06-08 | Discharge: 2019-06-08 | Disposition: A | Discharge status: Home / Self Care | Payer: Medicare HMO | Source: Ambulatory Visit | Attending: Internal Medicine | Admitting: Internal Medicine

## 2019-06-08 DIAGNOSIS — R0789 Other chest pain: Secondary | ICD-10-CM | POA: Diagnosis not present

## 2019-06-08 DIAGNOSIS — R1013 Epigastric pain: Secondary | ICD-10-CM | POA: Insufficient documentation

## 2019-06-16 ENCOUNTER — Ambulatory Visit: Payer: Medicare HMO | Admitting: Family Medicine

## 2019-06-16 ENCOUNTER — Encounter: Payer: Self-pay | Admitting: Family Medicine

## 2019-06-16 ENCOUNTER — Other Ambulatory Visit: Payer: Self-pay

## 2019-06-16 ENCOUNTER — Ambulatory Visit: Payer: Medicare HMO

## 2019-06-16 VITALS — BP 120/64 | HR 50 | Ht 60.0 in | Wt 153.0 lb

## 2019-06-16 DIAGNOSIS — G8929 Other chronic pain: Secondary | ICD-10-CM

## 2019-06-16 DIAGNOSIS — M1711 Unilateral primary osteoarthritis, right knee: Secondary | ICD-10-CM | POA: Diagnosis not present

## 2019-06-16 DIAGNOSIS — M25561 Pain in right knee: Secondary | ICD-10-CM

## 2019-06-16 NOTE — Patient Instructions (Addendum)
Good to see you Exercise 3 times a week Stay active See me again in 7-8 weeks if no better

## 2019-06-16 NOTE — Progress Notes (Signed)
[facility] [address] [place] Phone: [phone] Subjective:   I Courtney [name] am serving as a Education administrator for Dr. Hulan Saas.  This visit occurred during the SARS-CoV-2 public health emergency.  Safety protocols were in place, including screening questions prior to the visit, additional usage of staff PPE, and extensive cleaning of exam room while observing appropriate contact time as indicated for disinfecting solutions.   I'm seeing this patient by the request  of:  Marrian Salvage, FNP  CC: Right knee pain  PXT:[id number]  Courtney [name] is a 69 y.o. female coming in with complaint of right knee pain. Last seen in September 2020 for thumb pain. Patient remembers cleaning out a shed and doing a lot of kneeling. Pain wakes her up at night. Clicking and popping with walking.   Onset- February  Location - Medial   Character- sharp  Aggravating factors- Sleeping (laying on the right side) Reliving factors-  Therapies tried- voltaren, CBD balm, tylenol, heat  Severity- 5/10 at its worse      Past Medical History:  Diagnosis Date  . Aortic valve sclerosis    echo 27/0350, soft systolic murmur  . 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 Back pain   . Carotid artery disease ([place])    40-59% bilateral, doppler December 2010  . Cataract   . Constipation   . Coronary artery disease    a. BMS-mid RCA 11/2007 b. stress echo 02/2009 subtle inferior HK, lateral ST depressions with stress c. follow-up cath 02/2009: RCA stent patent, severe but stable stenosis of distal posterior lateral branch RCA. LV normal, med tx unless more sx c. ETT Myoview 08/27/12: negative for ischemia; EF 64%  . Decreased hearing   . Depression    Mild, August, 2012  . Dyslipidemia   . Easy bruising   . Ejection fraction    EF normal, catheterization, 2011 /  EF 55%, echo, 2010  . Fluid overload    Mild, stable since hospitalization in the past  . GERD  (gastroesophageal reflux disease)    occarionally, takes Pepcid over the counter  . Heart murmur    has, occasional PVC's  . History of right hip replacement   . Hyperlipidemia   . Hypertension   . Joint pain   . Myocardial infarction ([place])    2009  . Palpitations   . PVC (premature ventricular contraction)    per prior Holter monitor  . Right hip pain 12/2009  . Shellfish allergy   . Trouble in sleeping   . Vitamin B12 deficiency   . Vitamin D deficiency    Past Surgical History:  Procedure Laterality Date  . 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/01/2005  . BREAST BIOPSY Left   . carcinoid tumor removal     colon-  . CARDIAC CATHETERIZATION  1/211   Patent RCA stent, stenotic PLB supplying small distribution; medically managed  . CATARACT EXTRACTION, BILATERAL    . CORONARY ANGIOPLASTY WITH STENT PLACEMENT  11/2007   BMS-mid RCA  . DIAGNOSTIC LAPAROSCOPY     1982 for endometriosis  . DILATION AND CURETTAGE OF UTERUS  2005  . SKIN CANCER EXCISION    . TOTAL HIP ARTHROPLASTY Right 07/08/2014   Procedure: RIGHT TOTAL HIP ARTHROPLASTY ANTERIOR APPROACH;  Surgeon: Mcarthur Rossetti, MD;  Location: [facility];  Service: Orthopedics;  Laterality: Right;  . WISDOM TOOTH EXTRACTION     Social History   Socioeconomic History  .  Marital status: Divorced    Spouse name: Not on file  . Number of children: 1  . Years of education: 41  . Highest education level: Not on file  Occupational History  . Occupation: Retired - Retail buyer  Tobacco Use  . Smoking status: Never Smoker  . Smokeless tobacco: Never Used  Substance and Sexual Activity  . Alcohol use: Yes    Alcohol/week: 7.0 standard drinks    Types: 7 Glasses of wine per week    Comment: 1 glass of wine/night  . Drug use: No  . Sexual activity: Yes  Other Topics Concern  . Not on file  Social History Narrative   Fun: Dance, garden,    Denies religious beliefs effecting health care.   Denies abuse and feels safe at home    Social Determinants of Health   Financial Resource Strain:   . Difficulty of Paying Living Expenses:   Food Insecurity:   . Worried About Charity fundraiser in the Last Year:   . Arboriculturist in the Last Year:   Transportation Needs:   . Film/video editor (Medical):   [name] Lack of Transportation (Non-Medical):   Physical Activity:   . Days of Exercise per Week:   . Minutes of Exercise per Session:   Stress:   . Feeling of Stress :   Social Connections:   . Frequency of Communication with Friends and Family:   . Frequency of Social Gatherings with Friends and Family:   . Attends Religious Services:   . Active Member of Clubs or Organizations:   . Attends Archivist Meetings:   [name] Marital Status:    Allergies  Allergen Reactions  . Erythromycin Hives  . Penicillins Hives    Has patient had a PCN reaction causing immediate rash, facial/tongue/throat swelling, SOB or lightheadedness with hypotension: No Has patient had a PCN reaction causing severe rash involving mucus membranes or skin necrosis: No Has patient had a PCN reaction that required hospitalization: No Has patient had a PCN reaction occurring within the last 10 years: No If all of the above answers are "NO", then may proceed with Cephalosporin use.   . Shellfish Allergy Hives   Family History  Problem Relation Age of Onset  . Heart attack Father 44  . Hyperlipidemia Father   . Hypertension Father   . Sudden death Father   . Stroke Mother   . Hyperlipidemia Mother   . Hypertension Mother   . Heart disease Mother   . Obesity Mother   . Heart attack Paternal Grandfather   . Heart disease Sister   . BRCA 1/2 Neg Hx   . Breast cancer Neg Hx   . Esophageal cancer Neg Hx   . Colon cancer Neg Hx   . Pancreatic cancer Neg Hx   . Liver disease Neg Hx   . Stomach cancer Neg Hx      Current Outpatient Medications (Cardiovascular):  .  ezetimibe (ZETIA) 10 MG tablet, Take 1 tablet (10 mg total)  by mouth daily. .  metoprolol tartrate (LOPRESSOR) 25 MG tablet, Take 0.5 tablets (12.5 mg total) by mouth 2 (two) times daily. .  nitroGLYCERIN (NITROSTAT) 0.4 MG SL tablet, Place 1 tablet (0.4 mg total) under the tongue every 5 (five) minutes as needed for chest pain. .  ramipril (ALTACE) 10 MG capsule, Take 1 capsule (10 mg total) by mouth daily. .  rosuvastatin (CRESTOR) 40 MG tablet, Take 1 tablet (40 mg  total) by mouth daily.   Current Outpatient Medications (Analgesics):  .  acetaminophen (TYLENOL) 500 MG tablet, Take 1,000 mg by mouth every 6 (six) hours as needed (Pain).  [name]  aspirin 81 MG tablet, Take 81 mg by mouth daily.   Current Outpatient Medications (Other):  .  dicyclomine (BENTYL) 20 MG tablet, Take 1 tablet (20 mg total) by mouth every 6 (six) hours. [name]  doxepin (SINEQUAN) 10 MG capsule, Take 1 capsule (10 mg total) by mouth at bedtime as needed. .  pantoprazole (PROTONIX) 40 MG tablet, Take 1 tablet (40 mg total) by mouth daily before breakfast. .  Vitamin D, Ergocalciferol, (DRISDOL) 1.25 MG (50000 UNIT) CAPS capsule, Take 1 capsule (50,000 Units total) by mouth every 7 (seven) days.   Reviewed prior external information including notes and imaging from  primary care provider As well as notes that were available from care everywhere and other healthcare systems.  Past medical history, social, surgical and family history all reviewed in electronic medical record.  No pertanent information unless stated regarding to the chief complaint.   Review of Systems:  No headache, visual changes, nausea, vomiting, diarrhea, constipation, dizziness, abdominal pain, skin rash, fevers, chills, night sweats, weight loss, swollen lymph nodes, body aches, joint swelling, chest pain, shortness of breath, mood changes. POSITIVE muscle aches  Objective  Blood pressure 120/64, pulse (!) 50, height 5' (1.524 m), weight 153 lb (69.4 kg), SpO2 98 %.   General: No apparent distress alert and  oriented x3 mood and affect normal, dressed appropriately.  HEENT: Pupils equal, extraocular movements intact  Respiratory: Patient's speak in full sentences and does not appear short of breath  Cardiovascular: No lower extremity edema, non tender, no erythema  Neuro: Cranial nerves II through XII are intact, neurovascularly intact in all extremities with 2+ DTRs and 2+ pulses.  Gait normal with good balance and coordination.  MSK: Right knee exam shows the patient does have some mild lateral tracking of the patella noted.  Patient does have a positive patellar grind test noted.  No significant instability of the rest of the knee noted.     Impression and Recommendations:     This case required medical decision making of moderate complexity. The above documentation has been reviewed and is accurate and complete Lyndal Pulley, DO       Note: This dictation was prepared with Dragon dictation along with smaller phrase technology. Any transcriptional errors that result from this process are unintentional.

## 2019-06-16 NOTE — Assessment & Plan Note (Signed)
Mild to moderate with lateral tracking noted. Patient given Tru pull lite brace, x-rays pending, topical anti-inflammatories, discussed the importance of controlling patient's weight. Discussed ergonomics throughout the day that could be beneficial. Discussed icing regimen. Follow-up again in 4 to 8 weeks

## 2019-06-18 ENCOUNTER — Telehealth: Payer: Self-pay

## 2019-06-18 NOTE — Telephone Encounter (Signed)
Called patient about knee brace. She will pick up today around 1:30-2pm.

## 2019-06-30 ENCOUNTER — Encounter: Payer: Self-pay | Admitting: Family

## 2019-07-05 ENCOUNTER — Other Ambulatory Visit: Payer: Self-pay

## 2019-07-05 ENCOUNTER — Encounter (INDEPENDENT_AMBULATORY_CARE_PROVIDER_SITE_OTHER): Payer: Self-pay | Admitting: Family Medicine

## 2019-07-05 ENCOUNTER — Ambulatory Visit (INDEPENDENT_AMBULATORY_CARE_PROVIDER_SITE_OTHER): Payer: Medicare HMO | Admitting: Family Medicine

## 2019-07-05 VITALS — BP 113/63 | HR 52 | Temp 98.0°F | Ht 60.0 in | Wt 146.0 lb

## 2019-07-05 DIAGNOSIS — Z6830 Body mass index (BMI) 30.0-30.9, adult: Secondary | ICD-10-CM

## 2019-07-05 DIAGNOSIS — E559 Vitamin D deficiency, unspecified: Secondary | ICD-10-CM | POA: Diagnosis not present

## 2019-07-05 DIAGNOSIS — E669 Obesity, unspecified: Secondary | ICD-10-CM | POA: Diagnosis not present

## 2019-07-05 MED ORDER — PANTOPRAZOLE SODIUM 40 MG PO TBEC: 40.0000 mg | DELAYED_RELEASE_TABLET | Freq: Every day | ORAL | 3 refills | Status: DC

## 2019-07-05 MED ORDER — VITAMIN D (ERGOCALCIFEROL) 1.25 MG (50000 UNIT) PO CAPS: 50000.0000 [IU] | ORAL_CAPSULE | ORAL | 0 refills | Status: DC

## 2019-07-05 NOTE — Telephone Encounter (Signed)
Pantoprazole sent in to her mail order pharmacy as requested. Patient informed thru [facility].

## 2019-07-05 NOTE — Progress Notes (Signed)
Chief Complaint:   OBESITY Courtney [name] is here to discuss her progress with her obesity treatment plan along with follow-up of her obesity related diagnoses. Courtney [name] is on the Category 1 Plan and states she is following her eating plan approximately 90% of the time. Courtney [name] states she is using exercise bands and walking for 10-45 minutes 6 times per week.  Today's visit was #: 80 Starting weight: 157 lbs Starting date: 06/16/2017 Today's weight: 146 lbs Today's date: 07/05/2019 Total lbs lost to date: 11 Total lbs lost since last in-office visit: 3  Interim History: Courtney [name] has done well with weight loss on her Low carbohydrate plan. Her hunger is controlled and she has increased her water intake. She has added a little fruit into her plan and is still losing weight well.  Subjective:   1. Vitamin D deficiency Courtney [name]'s Vit D level is slightly lower than goal on Vit D.  Assessment/Plan:   1. Vitamin D deficiency Low Vitamin D level contributes to fatigue and are associated with obesity, breast, and colon cancer. We will refill prescription Vitamin D for 1 month. Courtney [name] will follow-up for routine testing of Vitamin D, at least 2-3 times per year to avoid over-replacement. We will recheck labs in 1 month.  - Vitamin D, Ergocalciferol, (DRISDOL) 1.25 MG (50000 UNIT) CAPS capsule; Take 1 capsule (50,000 Units total) by mouth every 7 (seven) days.  Dispense: 4 capsule; Refill: 0  2. Class 1 obesity with serious comorbidity and body mass index (BMI) of 30.0 to 30.9 in adult, unspecified obesity type Courtney [name] is currently in the action stage of change. As such, her goal is to continue with weight loss efforts. She has agreed to following a lower carbohydrate, vegetable and lean protein rich diet plan.   Exercise goals: As is.  Behavioral modification strategies: meal planning and cooking strategies.  Courtney [name] has agreed to follow-up with our clinic in 4 weeks. She was informed of the importance of  frequent follow-up visits to maximize her success with intensive lifestyle modifications for her multiple health conditions.   Objective:   Blood pressure 113/63, pulse (!) 52, temperature 98 F (36.7 C), temperature source Oral, height 5' (1.524 m), weight 146 lb (66.2 kg), SpO2 98 %. Body mass index is 28.51 kg/m.  General: Cooperative, alert, well developed, in no acute distress. HEENT: Conjunctivae and lids unremarkable. Cardiovascular: Regular rhythm.  Lungs: Normal work of breathing. Neurologic: No focal deficits.   Lab Results  Component Value Date   CREATININE 0.72 01/25/2019   BUN 21 01/25/2019   NA 141 01/25/2019   K 4.6 01/25/2019   CL 105 01/25/2019   CO2 22 01/25/2019   Lab Results  Component Value Date   ALT 20 01/25/2019   AST 21 01/25/2019   ALKPHOS 66 01/25/2019   BILITOT 0.3 01/25/2019   Lab Results  Component Value Date   HGBA1C 5.4 09/14/2018   HGBA1C 5.6 03/03/2018   HGBA1C 5.6 10/15/2017   HGBA1C 5.6 06/16/2017   Lab Results  Component Value Date   INSULIN 4.2 09/14/2018   INSULIN 5.0 03/03/2018   INSULIN 5.9 10/15/2017   INSULIN 6.4 06/16/2017   Lab Results  Component Value Date   TSH 0.931 06/16/2017   Lab Results  Component Value Date   CHOL 151 01/25/2019   HDL 66 01/25/2019   LDLCALC 72 01/25/2019   TRIG 63 01/25/2019   CHOLHDL 2.5 10/29/2016   Lab Results  Component Value Date   WBC 7.6  01/25/2019   HGB 12.3 01/25/2019   HCT 36.7 01/25/2019   MCV 94 01/25/2019   PLT 201 01/25/2019   Lab Results  Component Value Date   IRON 100 01/25/2019   TIBC 328 01/25/2019   FERRITIN 54 01/25/2019    Obesity Behavioral Intervention Documentation for Insurance:   Approximately 15 minutes were spent on the discussion below.  ASK: We discussed the diagnosis of obesity with Courtney [name] today and Courtney [name] agreed to give [place] permission to discuss obesity behavioral modification therapy today.  ASSESS: Courtney [name] has the diagnosis of obesity and  her BMI today is 28.51. Courtney [name] is in the action stage of change.   ADVISE: Courtney [name] was educated on the multiple health risks of obesity as well as the benefit of weight loss to improve her health. She was advised of the need for long term treatment and the importance of lifestyle modifications to improve her current health and to decrease her risk of future health problems.  AGREE: Multiple dietary modification options and treatment options were discussed and Courtney [name] agreed to follow the recommendations documented in the above note.  ARRANGE: Courtney [name] was educated on the importance of frequent visits to treat obesity as outlined per CMS and USPSTF guidelines and agreed to schedule her next follow up appointment today.  Attestation Statements:   Reviewed by clinician on day of visit: allergies, medications, problem list, medical history, surgical history, family history, social history, and previous encounter notes.   I, Trixie Dredge, am acting as transcriptionist for Dennard Nip, MD.  I have reviewed the above documentation for accuracy and completeness, and I agree with the above. -  Dennard Nip, MD

## 2019-08-02 ENCOUNTER — Ambulatory Visit (INDEPENDENT_AMBULATORY_CARE_PROVIDER_SITE_OTHER): Payer: Medicare HMO | Admitting: Family Medicine

## 2019-08-04 ENCOUNTER — Other Ambulatory Visit: Payer: Self-pay

## 2019-08-04 ENCOUNTER — Ambulatory Visit (INDEPENDENT_AMBULATORY_CARE_PROVIDER_SITE_OTHER): Payer: Medicare HMO | Admitting: Family Medicine

## 2019-08-04 ENCOUNTER — Encounter (INDEPENDENT_AMBULATORY_CARE_PROVIDER_SITE_OTHER): Payer: Self-pay | Admitting: Family Medicine

## 2019-08-04 VITALS — BP 100/61 | HR 51 | Temp 97.6°F | Ht 60.0 in | Wt 144.0 lb

## 2019-08-04 DIAGNOSIS — I10 Essential (primary) hypertension: Secondary | ICD-10-CM | POA: Diagnosis not present

## 2019-08-04 DIAGNOSIS — E559 Vitamin D deficiency, unspecified: Secondary | ICD-10-CM

## 2019-08-04 DIAGNOSIS — E669 Obesity, unspecified: Secondary | ICD-10-CM

## 2019-08-04 DIAGNOSIS — Z6830 Body mass index (BMI) 30.0-30.9, adult: Secondary | ICD-10-CM | POA: Diagnosis not present

## 2019-08-04 DIAGNOSIS — E7849 Other hyperlipidemia: Secondary | ICD-10-CM | POA: Diagnosis not present

## 2019-08-04 DIAGNOSIS — G4709 Other insomnia: Secondary | ICD-10-CM

## 2019-08-04 DIAGNOSIS — E538 Deficiency of other specified B group vitamins: Secondary | ICD-10-CM | POA: Diagnosis not present

## 2019-08-04 MED ORDER — VITAMIN D (ERGOCALCIFEROL) 1.25 MG (50000 UNIT) PO CAPS: 50000.0000 [IU] | ORAL_CAPSULE | ORAL | 0 refills | Status: DC

## 2019-08-05 LAB — CBC WITH DIFFERENTIAL/PLATELET
Basophils Absolute: 0.1 10*3/uL (ref 0.0–0.2)
Basos: 1 %
EOS (ABSOLUTE): 0.2 10*3/uL (ref 0.0–0.4)
Eos: 3 %
Hematocrit: 39.7 % (ref 34.0–46.6)
Hemoglobin: 13.3 g/dL (ref 11.1–15.9)
Immature Grans (Abs): 0 10*3/uL (ref 0.0–0.1)
Immature Granulocytes: 0 %
Lymphocytes Absolute: 3.5 10*3/uL — ABNORMAL HIGH (ref 0.7–3.1)
Lymphs: 44 %
MCH: 31.7 pg (ref 26.6–33.0)
MCHC: 33.5 g/dL (ref 31.5–35.7)
MCV: 95 fL (ref 79–97)
Monocytes Absolute: 0.5 10*3/uL (ref 0.1–0.9)
Monocytes: 6 %
Neutrophils Absolute: 3.7 10*3/uL (ref 1.4–7.0)
Neutrophils: 46 %
Platelets: 223 10*3/uL (ref 150–450)
RBC: 4.19 x10E6/uL (ref 3.77–5.28)
RDW: 12.1 % (ref 11.7–15.4)
WBC: 8.1 10*3/uL (ref 3.4–10.8)

## 2019-08-05 LAB — INSULIN, RANDOM: INSULIN: 4.9 u[IU]/mL (ref 2.6–24.9)

## 2019-08-05 LAB — LIPID PANEL WITH LDL/HDL RATIO
Cholesterol, Total: 165 mg/dL (ref 100–199)
HDL: 66 mg/dL (ref >39)
LDL Chol Calc (NIH): 80 mg/dL (ref 0–99)
LDL/HDL Ratio: 1.2 ratio (ref 0.0–3.2)
Triglycerides: 109 mg/dL (ref 0–149)
VLDL Cholesterol Cal: 19 mg/dL (ref 5–40)

## 2019-08-05 LAB — TSH: TSH: 2.41 u[IU]/mL (ref 0.450–4.500)

## 2019-08-05 LAB — COMPREHENSIVE METABOLIC PANEL
ALT: 23 IU/L (ref 0–32)
AST: 23 IU/L (ref 0–40)
Albumin/Globulin Ratio: 2 (ref 1.2–2.2)
Albumin: 4.7 g/dL (ref 3.8–4.8)
Alkaline Phosphatase: 69 IU/L (ref 48–121)
BUN/Creatinine Ratio: 32 — ABNORMAL HIGH (ref 12–28)
BUN: 31 mg/dL — ABNORMAL HIGH (ref 8–27)
Bilirubin Total: 0.4 mg/dL (ref 0.0–1.2)
CO2: 23 mmol/L (ref 20–29)
Calcium: 9.9 mg/dL (ref 8.7–10.3)
Chloride: 102 mmol/L (ref 96–106)
Creatinine, Ser: 0.96 mg/dL (ref 0.57–1.00)
GFR calc Af Amer: 69 mL/min/{1.73_m2} (ref >59)
GFR calc non Af Amer: 60 mL/min/{1.73_m2} (ref >59)
Globulin, Total: 2.3 g/dL (ref 1.5–4.5)
Glucose: 94 mg/dL (ref 65–99)
Potassium: 4.5 mmol/L (ref 3.5–5.2)
Sodium: 139 mmol/L (ref 134–144)
Total Protein: 7 g/dL (ref 6.0–8.5)

## 2019-08-05 LAB — T3: T3, Total: 92 ng/dL (ref 71–180)

## 2019-08-05 LAB — HEMOGLOBIN A1C
Est. average glucose Bld gHb Est-mCnc: 117 mg/dL
Hgb A1c MFr Bld: 5.7 % — ABNORMAL HIGH (ref 4.8–5.6)

## 2019-08-05 LAB — T4, FREE: Free T4: 1.06 ng/dL (ref 0.82–1.77)

## 2019-08-05 LAB — VITAMIN D 25 HYDROXY (VIT D DEFICIENCY, FRACTURES): Vit D, 25-Hydroxy: 54 ng/mL (ref 30.0–100.0)

## 2019-08-05 LAB — VITAMIN B12: Vitamin B-12: 365 pg/mL (ref 232–1245)

## 2019-08-05 NOTE — Progress Notes (Signed)
Chief Complaint:   OBESITY Courtney [name] is here to discuss her progress with her obesity treatment plan along with follow-up of her obesity related diagnoses. Courtney [name] is on following a lower carbohydrate, vegetable and lean protein rich diet plan and states she is following her eating plan approximately 90% of the time. Courtney [name] states she is walking for 40-45 minutes 2 times per week.  Today's visit was #: 10 Starting weight: 157 lbs Starting date: 06/16/2017 Today's weight: 144 lbs Today's date: 08/04/2019 Total lbs lost to date: 13 Total lbs lost since last in-office visit: 2  Interim History: Courtney [name] notes trouble falling and staying asleep. She sleeps 3-4 hours and has been retired since 2017. She goes to bed around 10 pm to 8:30 am. She sleeps 6 hours. She finds it easy to follow the plan. She likes the Low carbohydrate plan, and she is getting all of the protein in.  Subjective:   1. Other hyperlipidemia Courtney [name] is taking Crestor per her Cardiologist. She denies myalgias.  2. Essential hypertension Courtney [name] denies dizziness or lightheadedness. Her blood pressure is well controlled at her office visit today.  3. Vitamin D deficiency Courtney [name] denies difficulty with medications, and she is tolerating it well. She denies nausea or vomiting. She notes her energy level is good.  4. Vitamin B 12 deficiency Courtney [name] was borderline too low approximately 7 months ago at 300.  5. Other insomnia Courtney [name] has been on CBD and various drugs in the past and has a good sleep hygiene habit.  Assessment/Plan:   1. Other hyperlipidemia Cardiovascular risk and specific lipid/LDL goals reviewed. We discussed several lifestyle modifications today and Courtney [name] will continue to work on diet, exercise and weight loss efforts. We will check labs today, and Francille will continue her medications per Cardiology. Orders and follow up as documented in patient record.   - CBC with Differential/Platelet - Comprehensive metabolic  panel - Hemoglobin A1c - Insulin, random - Lipid Panel With LDL/HDL Ratio - T3 - T4, free - TSH  2. Essential hypertension Courtney [name] is working on healthy weight loss, diet, and exercise to improve blood pressure control. We will watch for signs of hypotension as she continues her lifestyle modifications. Courtney [name] will continue her medications, and she will continue to monitor home blood pressures and keep a log. We will check labs today.  - CBC with Differential/Platelet - Comprehensive metabolic panel - Hemoglobin A1c - Insulin, random - Lipid Panel With LDL/HDL Ratio - T3 - T4, free - TSH  3. Vitamin D deficiency Low Vitamin D level contributes to fatigue and are associated with obesity, breast, and colon cancer. We will check labs today, and we will refill prescription Vitamin D for 1 month. Courtney [name] will follow-up for routine testing of Vitamin D, at least 2-3 times per year to avoid over-replacement.  - VITAMIN D 25 Hydroxy (Vit-D Deficiency, Fractures)  - Vitamin D, Ergocalciferol, (DRISDOL) 1.25 MG (50000 UNIT) CAPS capsule; Take 1 capsule (50,000 Units total) by mouth every 7 (seven) days.  Dispense: 4 capsule; Refill: 0  4. Vitamin B 12 deficiency The diagnosis was reviewed with the patient. We will continue to monitor. We will check labs today, and  Courtney [name] will continue with a B12 rich diet. Orders and follow up as documented in patient record.  - Vitamin B12 - T3 - T4, free - TSH  5. Other insomnia The problem of recurrent insomnia was discussed. Orders and follow up as documented in patient record. Counseling: Intensive lifestyle modifications  are the first line treatment for this issue. We discussed several lifestyle modifications today and she will continue to work on diet, exercise and weight loss efforts. Courtney [name] would prefer to continue CBD, and then will discuss with her primary care provider further if she has concerns.  - CBC with Differential/Platelet - Comprehensive  metabolic panel - Lipid Panel With LDL/HDL Ratio - VITAMIN D 25 Hydroxy (Vit-D Deficiency, Fractures) - Vitamin B12 - T3 - T4, free - TSH  6. Class 1 obesity with serious comorbidity and body mass index (BMI) of 30.0 to 30.9 in adult, unspecified obesity type Courtney [name] is currently in the action stage of change. As such, her goal is to continue with weight loss efforts. She has agreed to the Category 1 Plan or following a lower carbohydrate, vegetable and lean protein rich diet plan.   Exercise goals: Older adults should follow the adult guidelines. When older adults cannot meet the adult guidelines, they should be as physically active as their abilities and conditions will allow.   Behavioral modification strategies: increasing lean protein intake and ways to avoid night time snacking.  Courtney [name] has agreed to follow-up with our clinic in 4 weeks. She was informed of the importance of frequent follow-up visits to maximize her success with intensive lifestyle modifications for her multiple health conditions.   Courtney [name] was informed we would discuss her lab results at her next visit unless there is a critical issue that needs to be addressed sooner. Courtney [name] agreed to keep her next visit at the agreed upon time to discuss these results.  Objective:   Blood pressure 100/61, pulse (!) 51, temperature 97.6 F (36.4 C), temperature source Oral, height 5' (1.524 m), weight 144 lb (65.3 kg), SpO2 96 %. Body mass index is 28.12 kg/m.  General: Cooperative, alert, well developed, in no acute distress. HEENT: Conjunctivae and lids unremarkable. Cardiovascular: Regular rhythm.  Lungs: Normal work of breathing. Neurologic: No focal deficits.   Lab Results  Component Value Date   CREATININE 0.96 08/04/2019   BUN 31 (H) 08/04/2019   NA 139 08/04/2019   K 4.5 08/04/2019   CL 102 08/04/2019   CO2 23 08/04/2019   Lab Results  Component Value Date   ALT 23 08/04/2019   AST 23 08/04/2019   ALKPHOS 69  08/04/2019   BILITOT 0.4 08/04/2019   Lab Results  Component Value Date   HGBA1C 5.7 (H) 08/04/2019   HGBA1C 5.4 09/14/2018   HGBA1C 5.6 03/03/2018   HGBA1C 5.6 10/15/2017   HGBA1C 5.6 06/16/2017   Lab Results  Component Value Date   INSULIN 4.9 08/04/2019   INSULIN 4.2 09/14/2018   INSULIN 5.0 03/03/2018   INSULIN 5.9 10/15/2017   INSULIN 6.4 06/16/2017   Lab Results  Component Value Date   TSH 2.410 08/04/2019   Lab Results  Component Value Date   CHOL 165 08/04/2019   HDL 66 08/04/2019   LDLCALC 80 08/04/2019   TRIG 109 08/04/2019   CHOLHDL 2.5 10/29/2016   Lab Results  Component Value Date   WBC 8.1 08/04/2019   HGB 13.3 08/04/2019   HCT 39.7 08/04/2019   MCV 95 08/04/2019   PLT 223 08/04/2019   Lab Results  Component Value Date   IRON 100 01/25/2019   TIBC 328 01/25/2019   FERRITIN 54 01/25/2019    Obesity Behavioral Intervention Documentation for Insurance:   Approximately 15 minutes were spent on the discussion below.  ASK: We discussed the diagnosis of obesity with Courtney [name]  today and Courtney [name] agreed to give [place] permission to discuss obesity behavioral modification therapy today.  ASSESS: Andres has the diagnosis of obesity and her BMI today is 28.12. Amillion is in the action stage of change.   ADVISE: Kieanna was educated on the multiple health risks of obesity as well as the benefit of weight loss to improve her health. She was advised of the need for long term treatment and the importance of lifestyle modifications to improve her current health and to decrease her risk of future health problems.  AGREE: Multiple dietary modification options and treatment options were discussed and Lemmie agreed to follow the recommendations documented in the above note.  ARRANGE: Stuti was educated on the importance of frequent visits to treat obesity as outlined per CMS and USPSTF guidelines and agreed to schedule her next follow up appointment today.  Attestation  Statements:   Reviewed by clinician on day of visit: allergies, medications, problem list, medical history, surgical history, family history, social history, and previous encounter notes.   I, Trixie Dredge, am acting as transcriptionist for Dennard Nip, MD.  I have reviewed the above documentation for accuracy and completeness, and I agree with the above. -  Dennard Nip, MD

## 2019-08-12 ENCOUNTER — Encounter: Payer: Self-pay | Admitting: Family

## 2019-08-30 ENCOUNTER — Ambulatory Visit (INDEPENDENT_AMBULATORY_CARE_PROVIDER_SITE_OTHER): Payer: Medicare HMO | Admitting: Family Medicine

## 2019-08-30 ENCOUNTER — Encounter (INDEPENDENT_AMBULATORY_CARE_PROVIDER_SITE_OTHER): Payer: Self-pay | Admitting: Family Medicine

## 2019-08-30 ENCOUNTER — Other Ambulatory Visit: Payer: Self-pay

## 2019-08-30 VITALS — BP 123/64 | HR 59 | Temp 98.0°F | Ht 60.0 in | Wt 145.0 lb

## 2019-08-30 DIAGNOSIS — Z6830 Body mass index (BMI) 30.0-30.9, adult: Secondary | ICD-10-CM | POA: Diagnosis not present

## 2019-08-30 DIAGNOSIS — E559 Vitamin D deficiency, unspecified: Secondary | ICD-10-CM

## 2019-08-30 DIAGNOSIS — E669 Obesity, unspecified: Secondary | ICD-10-CM

## 2019-09-01 NOTE — Progress Notes (Signed)
Chief Complaint:   OBESITY Courtney [name] is here to discuss her progress with her obesity treatment plan along with follow-up of her obesity related diagnoses. Courtney [name] is on the Category 1 Plan and following a lower carbohydrate, vegetable and lean protein rich diet plan and states she is following her eating plan approximately 90-95% of the time. Courtney [name] states she is walking and doing yard work for 45 minutes 2 times per week.  Today's visit was #: 40 Starting weight: 157 lbs Starting date: 06/16/2017 Today's weight: 145 lbs Today's date: 08/31/2019 Total lbs lost to date: 12 Total lbs lost since last in-office visit: 0  Interim History: Courtney [name] continues to do well maintaining her weight. She is trying to reduce carbohydrates.  Subjective:   1. Vitamin D deficiency Courtney [name] is stable on Vit D, and her level is now at goal.  Assessment/Plan:   1. Vitamin D deficiency Low Vitamin D level contributes to fatigue and are associated with obesity, breast, and colon cancer. We will refill prescription Vitamin D for 1 month. Courtney [name] will follow-up for routine testing of Vitamin D, at least 2-3 times per year to avoid over-replacement.  - Vitamin D, Ergocalciferol, (DRISDOL) 1.25 MG (50000 UNIT) CAPS capsule; Take 1 capsule (50,000 Units total) by mouth every 7 (seven) days.  Dispense: 4 capsule; Refill: 0  2. Class 1 obesity with serious comorbidity and body mass index (BMI) of 30.0 to 30.9 in adult, unspecified obesity type Courtney [name] is currently in the action stage of change. As such, her goal is to continue with weight loss efforts. She has agreed to following a lower carbohydrate, vegetable and lean protein rich diet plan.   Exercise goals: Core strengthening.  Behavioral modification strategies: increasing lean protein intake.  Courtney [name] has agreed to follow-up with our clinic in 4 weeks. She was informed of the importance of frequent follow-up visits to maximize her success with intensive lifestyle  modifications for her multiple health conditions.   Objective:   Blood pressure 123/64, pulse (!) 59, temperature 98 F (36.7 C), temperature source Oral, height 5' (1.524 m), weight 145 lb (65.8 kg), SpO2 98 %. Body mass index is 28.32 kg/m.  General: Cooperative, alert, well developed, in no acute distress. HEENT: Conjunctivae and lids unremarkable. Cardiovascular: Regular rhythm.  Lungs: Normal work of breathing. Neurologic: No focal deficits.   Lab Results  Component Value Date   CREATININE 0.96 08/04/2019   BUN 31 (H) 08/04/2019   NA 139 08/04/2019   K 4.5 08/04/2019   CL 102 08/04/2019   CO2 23 08/04/2019   Lab Results  Component Value Date   ALT 23 08/04/2019   AST 23 08/04/2019   ALKPHOS 69 08/04/2019   BILITOT 0.4 08/04/2019   Lab Results  Component Value Date   HGBA1C 5.7 (H) 08/04/2019   HGBA1C 5.4 09/14/2018   HGBA1C 5.6 03/03/2018   HGBA1C 5.6 10/15/2017   HGBA1C 5.6 06/16/2017   Lab Results  Component Value Date   INSULIN 4.9 08/04/2019   INSULIN 4.2 09/14/2018   INSULIN 5.0 03/03/2018   INSULIN 5.9 10/15/2017   INSULIN 6.4 06/16/2017   Lab Results  Component Value Date   TSH 2.410 08/04/2019   Lab Results  Component Value Date   CHOL 165 08/04/2019   HDL 66 08/04/2019   LDLCALC 80 08/04/2019   TRIG 109 08/04/2019   CHOLHDL 2.5 10/29/2016   Lab Results  Component Value Date   WBC 8.1 08/04/2019   HGB 13.3 08/04/2019  HCT 39.7 08/04/2019   MCV 95 08/04/2019   PLT 223 08/04/2019   Lab Results  Component Value Date   IRON 100 01/25/2019   TIBC 328 01/25/2019   FERRITIN 54 01/25/2019    Obesity Behavioral Intervention Documentation for Insurance:   Approximately 15 minutes were spent on the discussion below.  ASK: We discussed the diagnosis of obesity with [name] today and [name] agreed to give [place] permission to discuss obesity behavioral modification therapy today.  ASSESS: [name] has the diagnosis of obesity and her BMI today  is 28.32. [name] is in the action stage of change.   ADVISE: [name] was educated on the multiple health risks of obesity as well as the benefit of weight loss to improve her health. She was advised of the need for long term treatment and the importance of lifestyle modifications to improve her current health and to decrease her risk of future health problems.  AGREE: Multiple dietary modification options and treatment options were discussed and [name] agreed to follow the recommendations documented in the above note.  ARRANGE: [name] was educated on the importance of frequent visits to treat obesity as outlined per CMS and USPSTF guidelines and agreed to schedule her next follow up appointment today.  Attestation Statements:   Reviewed by clinician on day of visit: allergies, medications, problem list, medical history, surgical history, family history, social history, and previous encounter notes.   I, Courtney [name], am acting as transcriptionist for Courtney Nip, MD.  I have reviewed the above documentation for accuracy and completeness, and I agree with the above. -  Courtney Nip, MD

## 2019-09-02 ENCOUNTER — Other Ambulatory Visit (INDEPENDENT_AMBULATORY_CARE_PROVIDER_SITE_OTHER): Payer: Self-pay | Admitting: Family Medicine

## 2019-09-02 DIAGNOSIS — E559 Vitamin D deficiency, unspecified: Secondary | ICD-10-CM

## 2019-09-02 MED ORDER — VITAMIN D (ERGOCALCIFEROL) 1.25 MG (50000 UNIT) PO CAPS: 50000.0000 [IU] | ORAL_CAPSULE | ORAL | 0 refills | Status: DC

## 2019-09-08 ENCOUNTER — Ambulatory Visit: Payer: Medicare HMO

## 2019-09-11 NOTE — Progress Notes (Signed)
Cardiology Office Note    Date:  09/15/2019   ID:  ASA FATH, DOB Jun 17, 1950, MRN [id number]  PCP:  Marrian Salvage, [place]  Cardiologist:  Antrone Walla Martin[place], MD    History of Present Illness:  Courtney [name] is a 69 y.o. female seen for follow up CAD.  She has a history of CAD. Presented in 2009 and had a BMS of the mid RCA. She states she had 2 stents. In 2011 she had a mildly abnormal stress test and repeat cardiac cath showed nonobstructive CAD. In 2014 she had a normal Myoview study and again in October 2018. [name] She also has a history of mild carotid disease, HTN, PVCs, and obesity. She is retired as a Retail buyer for [employer]. Carotid dopplers in September 2020 showed no significant stenosis.  On follow up today she reports she is doing very well. No cardiac complaints. She was having midsternal chest pain back in Jan/Feb. Not associated with activity. Ended up having EGD which was normal. Her symptoms did resolve with Protonix. She currently denies chest pain or SOB. No edema or palpitations. She is working part time in the [employer] center. She is still on Dr Migdalia Dk weight control program. BP has been well controlled. She does walk regularly.    Past Medical History:  Diagnosis Date  . Aortic valve sclerosis    echo 50/0938, soft systolic murmur  . 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 Back pain   . Carotid artery disease ([place])    40-59% bilateral, doppler December 2010  . Cataract   . Constipation   . Coronary artery disease    a. BMS-mid RCA 11/2007 b. stress echo 02/2009 subtle inferior HK, lateral ST depressions with stress c. follow-up cath 02/2009: RCA stent patent, severe but stable stenosis of distal posterior lateral branch RCA. LV normal, med tx unless more sx c. ETT Myoview 08/27/12: negative for ischemia; EF 64%  . Decreased hearing   . Depression    Mild, August, 2012  . Dyslipidemia   . Easy bruising   . Ejection fraction    EF normal,  catheterization, 2011 /  EF 55%, echo, 2010  . Fluid overload    Mild, stable since hospitalization in the past  . GERD (gastroesophageal reflux disease)    occarionally, takes Pepcid over the counter  . Heart murmur    has, occasional PVC's  . History of right hip replacement   . Hyperlipidemia   . Hypertension   . Joint pain   . Myocardial infarction ([place])    2009  . Palpitations   . PVC (premature ventricular contraction)    per prior Holter monitor  . Right hip pain 12/2009  . Shellfish allergy   . Trouble in sleeping   . Vitamin B12 deficiency   . Vitamin D deficiency     Past Surgical History:  Procedure Laterality Date  . 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/01/2005  . BREAST BIOPSY Left   . carcinoid tumor removal     colon-  . CARDIAC CATHETERIZATION  1/211   Patent RCA stent, stenotic PLB supplying small distribution; medically managed  . CATARACT EXTRACTION, BILATERAL    . CORONARY ANGIOPLASTY WITH STENT PLACEMENT  11/2007   BMS-mid RCA  . DIAGNOSTIC LAPAROSCOPY     1982 for endometriosis  . DILATION AND CURETTAGE OF UTERUS  2005  . SKIN CANCER EXCISION    . TOTAL HIP ARTHROPLASTY Right 07/08/2014   Procedure: RIGHT  TOTAL HIP ARTHROPLASTY ANTERIOR APPROACH;  Surgeon: Mcarthur Rossetti, MD;  Location: [facility];  Service: Orthopedics;  Laterality: Right;  . WISDOM TOOTH EXTRACTION      Current Medications: Outpatient Medications Prior to Visit  Medication Sig Dispense Refill  . acetaminophen (TYLENOL) 500 MG tablet Take 1,000 mg by mouth every 6 (six) hours as needed (Pain).     [name] aspirin 81 MG tablet Take 81 mg by mouth daily.    . Camphor-Menthol-Methyl Sal 04-22-13 % CREA Apply topically.    . cannabidiol (EPIDIOLEX) 100 MG/ML solution Take 25 mg by mouth.    . dicyclomine (BENTYL) 20 MG tablet Take 1 tablet (20 mg total) by mouth every 6 (six) hours. 120 tablet 1  . doxepin (SINEQUAN) 10 MG capsule Take 1 capsule (10 mg total) by mouth at bedtime as needed. 30 capsule  0  . nitroGLYCERIN (NITROSTAT) 0.4 MG SL tablet Place 1 tablet (0.4 mg total) under the tongue every 5 (five) minutes as needed for chest pain. 25 tablet 11  . pantoprazole (PROTONIX) 40 MG tablet Take 1 tablet (40 mg total) by mouth daily before breakfast. 180 tablet 3  . Passion Flower-Valerian 500-500 MG CAPS Take 1 capsule by mouth daily.    . Vitamin D, Ergocalciferol, (DRISDOL) 1.25 MG (50000 UNIT) CAPS capsule Take 1 capsule (50,000 Units total) by mouth every 7 (seven) days. 4 capsule 0  . ezetimibe (ZETIA) 10 MG tablet Take 1 tablet (10 mg total) by mouth daily. 90 tablet 3  . metoprolol tartrate (LOPRESSOR) 25 MG tablet Take 0.5 tablets (12.5 mg total) by mouth 2 (two) times daily. 90 tablet 3  . ramipril (ALTACE) 10 MG capsule Take 1 capsule (10 mg total) by mouth daily. 90 capsule 3  . rosuvastatin (CRESTOR) 40 MG tablet Take 1 tablet (40 mg total) by mouth daily. 90 tablet 3   No facility-administered medications prior to visit.     Allergies:   Erythromycin, Penicillins, and Shellfish allergy   Social History   Socioeconomic History  . Marital status: Divorced    Spouse name: Not on file  . Number of children: 1  . Years of education: 58  . Highest education level: Not on file  Occupational History  . Occupation: Retired - Retail buyer  Tobacco Use  . Smoking status: Never Smoker  . Smokeless tobacco: Never Used  Vaping Use  . Vaping Use: Never used  Substance and Sexual Activity  . Alcohol use: Yes    Alcohol/week: 7.0 standard drinks    Types: 7 Glasses of wine per week    Comment: 1 glass of wine/night  . Drug use: No  . Sexual activity: Yes  Other Topics Concern  . Not on file  Social History Narrative   Fun: Dance, garden,    Denies religious beliefs effecting health care.   Denies abuse and feels safe at home   Social Determinants of Health   Financial Resource Strain:   . Difficulty of Paying Living Expenses:   Food Insecurity:   . Worried  About Charity fundraiser in the Last Year:   . Arboriculturist in the Last Year:   Transportation Needs:   . Film/video editor (Medical):   [name] Lack of Transportation (Non-Medical):   Physical Activity:   . Days of Exercise per Week:   . Minutes of Exercise per Session:   Stress:   . Feeling of Stress :   Social Connections:   .  Frequency of Communication with Friends and Family:   . Frequency of Social Gatherings with Friends and Family:   . Attends Religious Services:   . Active Member of Clubs or Organizations:   . Attends Archivist Meetings:   [name] Marital Status:      Family History:  The patient's family history includes Heart attack in her paternal grandfather; Heart attack (age of onset: 10) in her father; Heart disease in her mother and sister; Hyperlipidemia in her father and mother; Hypertension in her father and mother; Obesity in her mother; Stroke in her mother; Sudden death in her father.   ROS:   Please see the history of present illness.    ROS All other systems reviewed and are negative.   PHYSICAL EXAM:   VS:  BP (!) 140/73   Pulse 58   Temp (!) 96.7 F (35.9 C)   Ht 5\' 4"  (1.626 m)   Wt 153 lb 6.4 oz (69.6 kg)   SpO2 96%   BMI 26.33 kg/m    GENERAL:  Well appearing, overweight WF in NAD HEENT:  PERRL, EOMI, sclera are clear. Oropharynx is clear. NECK:  No jugular venous distention, carotid upstroke brisk and symmetric, she has a soft right carotid/subclavian bruit versus radiated murmur, no thyromegaly or adenopathy LUNGS:  Clear to auscultation bilaterally CHEST:  Unremarkable HEART:  RRR,  PMI not displaced or sustained,S1 and S2 within normal limits, no S3, no S4: no clicks, no rubs, gr 2/6 systolic murmur RUSB radiating to carotids.  ABD:  Soft, nontender. BS +, no masses or bruits. No hepatomegaly, no splenomegaly EXT:  2 + pulses throughout, no edema, no cyanosis no clubbing SKIN:  Warm and dry.  No rashes NEURO:  Alert and oriented x  3. Cranial nerves II through XII intact. PSYCH:  Cognitively intact    Wt Readings from Last 3 Encounters:  09/15/19 153 lb 6.4 oz (69.6 kg)  08/30/19 145 lb (65.8 kg)  08/04/19 144 lb (65.3 kg)      Studies/Labs Reviewed:   EKG:  EKG is  ordered today.  Sinus brady rate 58. Otherwise normal. I have personally reviewed and interpreted this study.    Recent Labs: 08/04/2019: ALT 23; BUN 31; Creatinine, Ser 0.96; Hemoglobin 13.3; Platelets 223; Potassium 4.5; Sodium 139; TSH 2.410   Lipid Panel    Component Value Date/Time   CHOL 165 08/04/2019 1057   TRIG 109 08/04/2019 1057   HDL 66 08/04/2019 1057   CHOLHDL 2.5 10/29/2016 1030   CHOLHDL 3 11/30/2015 1028   VLDL 25.4 11/30/2015 1028   LDLCALC 80 08/04/2019 1057    Additional studies/ records that were reviewed today include:  Myoview 08/02/16: Study Highlights     The left ventricular ejection fraction is hyperdynamic (>65%).  Nuclear stress EF: 66%.  Blood pressure demonstrated a hypertensive response to exercise.  Horizontal ST segment depression ST segment depression of 1 mm was noted during stress in the II, III and aVF leads.  This is a low risk study.   ECG positive with 1 mm ST depression inferior leads peak stress and recovery However perfusion images normal with no ischemia or infarct EF 66% HTN response to exercise        ASSESSMENT:    1. Coronary artery disease involving native coronary artery of native heart without angina pectoris   2. Pure hypercholesterolemia   3. Essential hypertension      PLAN:  In order of problems listed above:  1. She remains asymptomatic. Myoview in October 2018 was normal. Continue current medical therapy with ASA, statin, metoprolol and ramipril. Encourage continued efforts at weight loss and exercise. Fortunately her chest pain earlier this year responded to Protonix. If she should have chest pain despite this we would need to reevaluate.  2. LDL up a  little from before at 80. continue high dose statin and Zetia.  3. HTN. Well controlled.  4. Carotid bruit. No significant stenosis by doppler in September 2020.   I will follow up in one year.    Medication Adjustments/Labs and Tests Ordered: Current medicines are reviewed at length with the patient today.  Concerns regarding medicines are outlined above.  Medication changes, Labs and Tests ordered today are listed in the Patient Instructions below. There are no Patient Instructions on file for this visit.   Signed, Candler Ginsberg Martinique, MD  09/15/2019 8:41 AM    [facility] [address] [place], [place], [address] [phone]

## 2019-09-15 ENCOUNTER — Encounter: Payer: Self-pay | Admitting: Cardiology

## 2019-09-15 ENCOUNTER — Other Ambulatory Visit: Payer: Self-pay

## 2019-09-15 ENCOUNTER — Ambulatory Visit: Payer: Medicare HMO | Admitting: Cardiology

## 2019-09-15 VITALS — BP 140/73 | HR 58 | Temp 96.7°F | Ht 64.0 in | Wt 153.4 lb

## 2019-09-15 DIAGNOSIS — I10 Essential (primary) hypertension: Secondary | ICD-10-CM | POA: Diagnosis not present

## 2019-09-15 DIAGNOSIS — E7800 Pure hypercholesterolemia, unspecified: Secondary | ICD-10-CM | POA: Diagnosis not present

## 2019-09-15 DIAGNOSIS — I2510 Atherosclerotic heart disease of native coronary artery without angina pectoris: Secondary | ICD-10-CM

## 2019-09-15 MED ORDER — EZETIMIBE 10 MG PO TABS: 10.0000 mg | ORAL_TABLET | Freq: Every day | ORAL | 3 refills | Status: DC

## 2019-09-15 MED ORDER — METOPROLOL TARTRATE 25 MG PO TABS: 12.5000 mg | ORAL_TABLET | Freq: Two times a day (BID) | ORAL | 3 refills | Status: DC

## 2019-09-15 MED ORDER — ROSUVASTATIN CALCIUM 40 MG PO TABS: 40.0000 mg | ORAL_TABLET | Freq: Every day | ORAL | 3 refills | Status: DC

## 2019-09-15 MED ORDER — RAMIPRIL 10 MG PO CAPS: 10.0000 mg | ORAL_CAPSULE | Freq: Every day | ORAL | 3 refills | Status: DC

## 2019-09-27 ENCOUNTER — Other Ambulatory Visit (INDEPENDENT_AMBULATORY_CARE_PROVIDER_SITE_OTHER): Payer: Self-pay | Admitting: Family Medicine

## 2019-09-27 ENCOUNTER — Ambulatory Visit (INDEPENDENT_AMBULATORY_CARE_PROVIDER_SITE_OTHER): Payer: Medicare HMO | Admitting: Family Medicine

## 2019-09-27 ENCOUNTER — Encounter (INDEPENDENT_AMBULATORY_CARE_PROVIDER_SITE_OTHER): Payer: Self-pay | Admitting: Family Medicine

## 2019-09-27 ENCOUNTER — Other Ambulatory Visit: Payer: Self-pay

## 2019-09-27 VITALS — BP 112/66 | HR 49 | Temp 97.8°F | Ht 60.0 in | Wt 145.0 lb

## 2019-09-27 DIAGNOSIS — E669 Obesity, unspecified: Secondary | ICD-10-CM

## 2019-09-27 DIAGNOSIS — Z6830 Body mass index (BMI) 30.0-30.9, adult: Secondary | ICD-10-CM

## 2019-09-27 DIAGNOSIS — E7849 Other hyperlipidemia: Secondary | ICD-10-CM | POA: Diagnosis not present

## 2019-09-27 DIAGNOSIS — E559 Vitamin D deficiency, unspecified: Secondary | ICD-10-CM | POA: Diagnosis not present

## 2019-09-27 MED ORDER — VITAMIN D (ERGOCALCIFEROL) 1.25 MG (50000 UNIT) PO CAPS: 50000.0000 [IU] | ORAL_CAPSULE | ORAL | 0 refills | Status: DC

## 2019-09-27 NOTE — Progress Notes (Signed)
Chief Complaint:   OBESITY Courtney [name] is here to discuss her progress with her obesity treatment plan along with follow-up of her obesity related diagnoses. Courtney [name] is on following a lower carbohydrate, vegetable and lean protein rich diet plan and states she is following her eating plan approximately 90% of the time. Courtney [name] states she is walking for 30 minutes 2 times per week.  Today's visit was #: 25 Starting weight: 157 lbs Starting date: 06/16/2017 Today's weight: 145 lbs Today's date: 09/27/2019 Total lbs lost to date: 12 Total lbs lost since last in-office visit: 0  Interim History: Courtney [name] continues to do well maintaining her weight even when on vacation. She did well making better choices and she is working on increasing lean protein.  Subjective:   1. Vitamin D deficiency Courtney [name] is stable on Vit D, and she denies nausea or vomiting.  2. Other hyperlipidemia Courtney [name]'s last LDL has started to creep up from 72 to 80.  Assessment/Plan:   1. Vitamin D deficiency Low Vitamin D level contributes to fatigue and are associated with obesity, breast, and colon cancer. We will refill prescription Vitamin D for 1 month. Courtney [name] will follow-up for routine testing of Vitamin D, at least 2-3 times per year to avoid over-replacement.  - Vitamin D, Ergocalciferol, (DRISDOL) 1.25 MG (50000 UNIT) CAPS capsule; Take 1 capsule (50,000 Units total) by mouth every 7 (seven) days.  Dispense: 4 capsule; Refill: 0  2. Other hyperlipidemia Cardiovascular risk and specific lipid/LDL goals reviewed. We discussed several lifestyle modifications today. We discussed lower cholesterol foods and to avoid animal fats, lean meets only, and will continues to monitor. Courtney [name] will continue to work on diet, exercise and weight loss efforts. Orders and follow up as documented in patient record.   3. Class 1 obesity with serious comorbidity and body mass index (BMI) of 30.0 to 30.9 in adult, unspecified obesity type Courtney [name] is  currently in the action stage of change. As such, her goal is to continue with weight loss efforts. She has agreed to the Category 1 Plan.   Exercise goals: As is.  Behavioral modification strategies: increasing lean protein intake.  Courtney [name] has agreed to follow-up with our clinic in 4 weeks. She was informed of the importance of frequent follow-up visits to maximize her success with intensive lifestyle modifications for her multiple health conditions.   Objective:   Blood pressure 112/66, pulse (!) 49, temperature 97.8 F (36.6 C), temperature source Oral, height 5' (1.524 m), weight 145 lb (65.8 kg), SpO2 98 %. Body mass index is 28.32 kg/m.  General: Cooperative, alert, well developed, in no acute distress. HEENT: Conjunctivae and lids unremarkable. Cardiovascular: Regular rhythm.  Lungs: Normal work of breathing. Neurologic: No focal deficits.   Lab Results  Component Value Date   CREATININE 0.96 08/04/2019   BUN 31 (H) 08/04/2019   NA 139 08/04/2019   K 4.5 08/04/2019   CL 102 08/04/2019   CO2 23 08/04/2019   Lab Results  Component Value Date   ALT 23 08/04/2019   AST 23 08/04/2019   ALKPHOS 69 08/04/2019   BILITOT 0.4 08/04/2019   Lab Results  Component Value Date   HGBA1C 5.7 (H) 08/04/2019   HGBA1C 5.4 09/14/2018   HGBA1C 5.6 03/03/2018   HGBA1C 5.6 10/15/2017   HGBA1C 5.6 06/16/2017   Lab Results  Component Value Date   INSULIN 4.9 08/04/2019   INSULIN 4.2 09/14/2018   INSULIN 5.0 03/03/2018   INSULIN 5.9 10/15/2017  INSULIN 6.4 06/16/2017   Lab Results  Component Value Date   TSH 2.410 08/04/2019   Lab Results  Component Value Date   CHOL 165 08/04/2019   HDL 66 08/04/2019   LDLCALC 80 08/04/2019   TRIG 109 08/04/2019   CHOLHDL 2.5 10/29/2016   Lab Results  Component Value Date   WBC 8.1 08/04/2019   HGB 13.3 08/04/2019   HCT 39.7 08/04/2019   MCV 95 08/04/2019   PLT 223 08/04/2019   Lab Results  Component Value Date   IRON 100  01/25/2019   TIBC 328 01/25/2019   FERRITIN 54 01/25/2019    Obesity Behavioral Intervention Documentation for Insurance:   Approximately 15 minutes were spent on the discussion below.  ASK: We discussed the diagnosis of obesity with Courtney [name] today and Courtney [name] agreed to give [place] permission to discuss obesity behavioral modification therapy today.  ASSESS: Courtney [name] has the diagnosis of obesity and her BMI today is 28.32. Courtney [name] is in the action stage of change.   ADVISE: Courtney [name] was educated on the multiple health risks of obesity as well as the benefit of weight loss to improve her health. She was advised of the need for long term treatment and the importance of lifestyle modifications to improve her current health and to decrease her risk of future health problems.  AGREE: Multiple dietary modification options and treatment options were discussed and Courtney [name] agreed to follow the recommendations documented in the above note.  ARRANGE: Courtney [name] was educated on the importance of frequent visits to treat obesity as outlined per CMS and USPSTF guidelines and agreed to schedule her next follow up appointment today.  Attestation Statements:   Reviewed by clinician on day of visit: allergies, medications, problem list, medical history, surgical history, family history, social history, and previous encounter notes.   I, Trixie Dredge, am acting as transcriptionist for Dennard Nip, MD.  I have reviewed the above documentation for accuracy and completeness, and I agree with the above. -  Dennard Nip, MD

## 2019-11-01 ENCOUNTER — Encounter (INDEPENDENT_AMBULATORY_CARE_PROVIDER_SITE_OTHER): Payer: Self-pay | Admitting: Family Medicine

## 2019-11-01 ENCOUNTER — Ambulatory Visit (INDEPENDENT_AMBULATORY_CARE_PROVIDER_SITE_OTHER): Payer: Medicare HMO | Admitting: Family Medicine

## 2019-11-01 ENCOUNTER — Other Ambulatory Visit: Payer: Self-pay

## 2019-11-01 VITALS — BP 105/64 | HR 45 | Temp 97.7°F | Ht 60.0 in | Wt 151.0 lb

## 2019-11-01 DIAGNOSIS — G4709 Other insomnia: Secondary | ICD-10-CM | POA: Diagnosis not present

## 2019-11-01 DIAGNOSIS — Z9189 Other specified personal risk factors, not elsewhere classified: Secondary | ICD-10-CM | POA: Diagnosis not present

## 2019-11-01 DIAGNOSIS — E669 Obesity, unspecified: Secondary | ICD-10-CM | POA: Diagnosis not present

## 2019-11-01 DIAGNOSIS — E559 Vitamin D deficiency, unspecified: Secondary | ICD-10-CM

## 2019-11-01 DIAGNOSIS — Z6830 Body mass index (BMI) 30.0-30.9, adult: Secondary | ICD-10-CM

## 2019-11-01 MED ORDER — VITAMIN D (ERGOCALCIFEROL) 1.25 MG (50000 UNIT) PO CAPS: 50000.0000 [IU] | ORAL_CAPSULE | ORAL | 0 refills | Status: DC

## 2019-11-01 NOTE — Progress Notes (Signed)
Chief Complaint:   OBESITY Courtney [name] is here to discuss her progress with her obesity treatment plan along with follow-up of her obesity related diagnoses. Courtney [name] is on the Category 1 Plan and states she is following her eating plan approximately 80% of the time. Courtney [name] states she is walking for 40 minutes 2-3 times per week.  Today's visit was #: 37 lbs Starting weight: 157 lbs Starting date: 06/16/2017 Today's weight: 151 lbs Today's date: 11/01/2019 Total lbs lost to date: 6 Total lbs lost since last in-office visit: 0  Interim History: Courtney [name] has had alot of stress dealing with her daughter's alcoholism. She has been supporting her financially and she is trying to minimize her inability. She has increased eating out during this time and she hasn't been able to concentrate her plan as closely but been mindful.  Subjective:   1. Vitamin D deficiency Courtney [name]'s last Vit D level was at goal. She denies nausea or vomiting.  2. Other insomnia Courtney [name] is working on sleep strategies and taking supplements to help her sleep. She feels she is actually sleeping better even with increased stress at home.  3. At risk for depression Courtney [name] is at elevated risk of depression due to one or more of the following: family history, significant life stressors, medical conditions and/or poor nutrition.  Assessment/Plan:   1. Vitamin D deficiency Low Vitamin D level contributes to fatigue and are associated with obesity, breast, and colon cancer. We will refill prescription Vitamin D for 1 month, and we will recheck labs in 1 month. Courtney [name] will follow-up for routine testing of Vitamin D, at least 2-3 times per year to avoid over-replacement.  - Vitamin D, Ergocalciferol, (DRISDOL) 1.25 MG (50000 UNIT) CAPS capsule; Take 1 capsule (50,000 Units total) by mouth every 7 (seven) days.  Dispense: 4 capsule; Refill: 0  2. Other insomnia The problem of recurrent insomnia was discussed. Orders and follow up as  documented in patient record. Counseling: Intensive lifestyle modifications are the first line treatment for this issue. We discussed several lifestyle modifications today. Courtney [name] will continue to work on sleep strategies as is, and will continue to work on diet, exercise and weight loss efforts.   3. At risk for depression Courtney [name] was given approximately 15 minutes of depression risk counseling today. She has risk factors for depression including stress. We discussed the importance of a healthy work life balance, a healthy relationship with food and a good support system.  Repetitive spaced learning was employed today to elicit superior memory formation and behavioral change.  4. Class 1 obesity with serious comorbidity and body mass index (BMI) of 30.0 to 30.9 in adult, unspecified obesity type Courtney [name] is currently in the action stage of change. As such, her goal is to continue with weight loss efforts. She has agreed to the Category 1 Plan.   Exercise goals: As is.  Behavioral modification strategies: emotional eating strategies.  Courtney [name] has agreed to follow-up with our clinic in 4 weeks. She was informed of the importance of frequent follow-up visits to maximize her success with intensive lifestyle modifications for her multiple health conditions.   Objective:   Blood pressure 105/64, pulse (!) 45, temperature 97.7 F (36.5 C), height 5' (1.524 m), weight 151 lb (68.5 kg), SpO2 99 %. Body mass index is 29.49 kg/m.  General: Cooperative, alert, well developed, in no acute distress. HEENT: Conjunctivae and lids unremarkable. Cardiovascular: Regular rhythm.  Lungs: Normal work of breathing. Neurologic: No focal deficits.  Lab Results  Component Value Date   CREATININE 0.96 08/04/2019   BUN 31 (H) 08/04/2019   NA 139 08/04/2019   K 4.5 08/04/2019   CL 102 08/04/2019   CO2 23 08/04/2019   Lab Results  Component Value Date   ALT 23 08/04/2019   AST 23 08/04/2019   ALKPHOS 69  08/04/2019   BILITOT 0.4 08/04/2019   Lab Results  Component Value Date   HGBA1C 5.7 (H) 08/04/2019   HGBA1C 5.4 09/14/2018   HGBA1C 5.6 03/03/2018   HGBA1C 5.6 10/15/2017   HGBA1C 5.6 06/16/2017   Lab Results  Component Value Date   INSULIN 4.9 08/04/2019   INSULIN 4.2 09/14/2018   INSULIN 5.0 03/03/2018   INSULIN 5.9 10/15/2017   INSULIN 6.4 06/16/2017   Lab Results  Component Value Date   TSH 2.410 08/04/2019   Lab Results  Component Value Date   CHOL 165 08/04/2019   HDL 66 08/04/2019   LDLCALC 80 08/04/2019   TRIG 109 08/04/2019   CHOLHDL 2.5 10/29/2016   Lab Results  Component Value Date   WBC 8.1 08/04/2019   HGB 13.3 08/04/2019   HCT 39.7 08/04/2019   MCV 95 08/04/2019   PLT 223 08/04/2019   Lab Results  Component Value Date   IRON 100 01/25/2019   TIBC 328 01/25/2019   FERRITIN 54 01/25/2019   Attestation Statements:   Reviewed by clinician on day of visit: allergies, medications, problem list, medical history, surgical history, family history, social history, and previous encounter notes.   I, Trixie Dredge, am acting as transcriptionist for Dennard Nip, MD.  I have reviewed the above documentation for accuracy and completeness, and I agree with the above. -  Dennard Nip, MD

## 2019-11-07 ENCOUNTER — Encounter (INDEPENDENT_AMBULATORY_CARE_PROVIDER_SITE_OTHER): Payer: Self-pay | Admitting: Family Medicine

## 2019-11-10 ENCOUNTER — Telehealth: Payer: Self-pay | Admitting: Cardiology

## 2019-11-10 ENCOUNTER — Other Ambulatory Visit: Payer: Self-pay | Admitting: Cardiology

## 2019-11-10 NOTE — Telephone Encounter (Signed)
Pt c/o BP issue: STAT if pt c/o blurred vision, one-sided weakness or slurred speech  1. What are your last 5 BP readings? 160/70 150/80 130/90 146/90 140/80  2. Are you having any other symptoms (ex. Dizziness, headache, blurred vision, passed out)? Dizziness  3. What is your BP issue? Patient states her BP has been high. She states she has felt some dizziness and felt a light headed drained feeling. She has a virtual appointment 11/12/2019 with Isaac Laud.

## 2019-11-10 NOTE — Telephone Encounter (Signed)
Spoke with pt, she is only taking the metoprolol tartrate once daily in the morning. She will increase the metoprolol and will keep her follow up appointment Friday.

## 2019-11-11 ENCOUNTER — Telehealth: Payer: Self-pay

## 2019-11-11 NOTE — Telephone Encounter (Signed)
  Patient Consent for Virtual Visit         Courtney [name] has provided verbal consent on 11/11/2019 for a virtual visit (video or telephone).   CONSENT FOR VIRTUAL VISIT FOR:  Courtney [name]  By participating in this virtual visit I agree to the following:  I hereby voluntarily request, consent and authorize [facility] and its employed or contracted physicians, physician assistants, nurse practitioners or other licensed health care professionals (the Practitioner), to provide me with telemedicine health care services (the "Services") as deemed necessary by the treating Practitioner. I acknowledge and consent to receive the Services by the Practitioner via telemedicine. I understand that the telemedicine visit will involve communicating with the Practitioner through live audiovisual communication technology and the disclosure of certain medical information by electronic transmission. I acknowledge that I have been given the opportunity to request an in-person assessment or other available alternative prior to the telemedicine visit and am voluntarily participating in the telemedicine visit.  I understand that I have the right to withhold or withdraw my consent to the use of telemedicine in the course of my care at any time, without affecting my right to future care or treatment, and that the Practitioner or I may terminate the telemedicine visit at any time. I understand that I have the right to inspect all information obtained and/or recorded in the course of the telemedicine visit and may receive copies of available information for a reasonable fee.  I understand that some of the potential risks of receiving the Services via telemedicine include:  [name] Delay or interruption in medical evaluation due to technological equipment failure or disruption; . Information transmitted may not be sufficient (e.g. poor resolution of images) to allow for appropriate medical decision making by the Practitioner;  and/or  . In rare instances, security protocols could fail, causing a breach of personal health information.  Furthermore, I acknowledge that it is my responsibility to provide information about my medical history, conditions and care that is complete and accurate to the best of my ability. I acknowledge that Practitioner's advice, recommendations, and/or decision may be based on factors not within their control, such as incomplete or inaccurate data provided by me or distortions of diagnostic images or specimens that may result from electronic transmissions. I understand that the practice of medicine is not an exact science and that Practitioner makes no warranties or guarantees regarding treatment outcomes. I acknowledge that a copy of this consent can be made available to me via my patient portal ([facility]), or I can request a printed copy by calling the office of [facility].    I understand that my insurance will be billed for this visit.   I have read or had this consent read to me. . I understand the contents of this consent, which adequately explains the benefits and risks of the Services being provided via telemedicine.  . I have been provided ample opportunity to ask questions regarding this consent and the Services and have had my questions answered to my satisfaction. . I give my informed consent for the services to be provided through the use of telemedicine in my medical care

## 2019-11-12 ENCOUNTER — Telehealth (INDEPENDENT_AMBULATORY_CARE_PROVIDER_SITE_OTHER): Payer: Medicare HMO | Admitting: Physician Assistant

## 2019-11-12 VITALS — BP 113/66 | HR 61 | Ht 60.0 in | Wt 150.0 lb

## 2019-11-12 DIAGNOSIS — E785 Hyperlipidemia, unspecified: Secondary | ICD-10-CM

## 2019-11-12 DIAGNOSIS — I10 Essential (primary) hypertension: Secondary | ICD-10-CM | POA: Diagnosis not present

## 2019-11-12 DIAGNOSIS — I2510 Atherosclerotic heart disease of native coronary artery without angina pectoris: Secondary | ICD-10-CM

## 2019-11-12 NOTE — Progress Notes (Signed)
Virtual Visit via Telephone Note   This visit type was conducted due to national recommendations for restrictions regarding the COVID-19 Pandemic (e.g. social distancing) in an effort to limit this patient's exposure and mitigate transmission in our community.  Due to her co-morbid illnesses, this patient is at least at moderate risk for complications without adequate follow up.  This format is felt to be most appropriate for this patient at this time.  The patient did not have access to video technology/had technical difficulties with video requiring transitioning to audio format only (telephone).  All issues noted in this document were discussed and addressed.  No physical exam could be performed with this format.  Please refer to the patient's chart for her  consent to telehealth for [facility].    Date:  11/14/2019   ID:  Courtney [name], DOB 09/21/1950, MRN [id number] The patient was identified using 2 identifiers.  Patient Location: Home Provider Location: Office/Clinic  PCP:  Marrian Salvage, [place]  Cardiologist:  Peter Martinique, MD  Electrophysiologist:  None   Evaluation Performed:  Follow-Up Visit  Chief Complaint:  Follow up  History of Present Illness:    Courtney [name] is a 69 y.o. female with CAD, carotid artery disease, hyperlipidemia, hypertension and a history of PVCs.  Prior to retirement, she was a Retail buyer at [employer].  Patient had a history of MI in 2009 and ended up having BMS placed in mid RCA.  In 2011, she had a mildly abnormal nuclear stress test.  Repeat cardiac catheterization at the time showed nonobstructive disease.  Repeat Myoview in October 2018 was normal.   Patient called cardiology service recently due to elevated blood pressure and a dizzy spell.  Dizzy spell has resolved, she had did notice her systolic blood pressure ranges between 130-1 60s range.  On further discussion, it turned out instead of 12.5 mg twice a day of metoprolol  tartrate, she was taking 25 mg daily of metoprolol tartrate.  She was instructed to split the metoprolol in half and the take it twice a day instead starting on Wednesday.  This morning, prior to her taking the blood pressure medication, her systolic blood pressure was actually in the 120s range, she took her blood pressure medication around 6 AM, by 10 AM, her systolic blood pressure went up to 140s.  Given the fact that her blood pressure seems to be fairly controlled except for episodic jump at this point, I recommended continue observation and to keep a blood pressure diary at home for the next 2 weeks.  She will need to blood pressure reading per day.  I plan to see the patient back in 3 to 4 weeks.  I did review the previous lab work from June 2021, renal function, electrolytes, red blood cell count and thyroid function are all normal.  The patient does not have symptoms concerning for COVID-19 infection (fever, chills, cough, or new shortness of breath).    Past Medical History:  Diagnosis Date  . Aortic valve sclerosis    echo 28/0034, soft systolic murmur  . 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 Back pain   . Carotid artery disease ([place])    40-59% bilateral, doppler December 2010  . Cataract   . Constipation   . Coronary artery disease    a. BMS-mid RCA 11/2007 b. stress echo 02/2009 subtle inferior HK, lateral ST depressions with stress c. follow-up cath 02/2009: RCA stent patent, severe but stable  stenosis of distal posterior lateral branch RCA. LV normal, med tx unless more sx c. ETT Myoview 08/27/12: negative for ischemia; EF 64%  . Decreased hearing   . Depression    Mild, August, 2012  . Dyslipidemia   . Easy bruising   . Ejection fraction    EF normal, catheterization, 2011 /  EF 55%, echo, 2010  . Fluid overload    Mild, stable since hospitalization in the past  . GERD (gastroesophageal reflux disease)    occarionally, takes Pepcid over the counter  . Heart murmur    has,  occasional PVC's  . History of right hip replacement   . Hyperlipidemia   . Hypertension   . Joint pain   . Myocardial infarction ([place])    2009  . Palpitations   . PVC (premature ventricular contraction)    per prior Holter monitor  . Right hip pain 12/2009  . Shellfish allergy   . Trouble in sleeping   . Vitamin B12 deficiency   . Vitamin D deficiency    Past Surgical History:  Procedure Laterality Date  . 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/01/2005  . BREAST BIOPSY Left   . carcinoid tumor removal     colon-  . CARDIAC CATHETERIZATION  1/211   Patent RCA stent, stenotic PLB supplying small distribution; medically managed  . CATARACT EXTRACTION, BILATERAL    . CORONARY ANGIOPLASTY WITH STENT PLACEMENT  11/2007   BMS-mid RCA  . DIAGNOSTIC LAPAROSCOPY     1982 for endometriosis  . DILATION AND CURETTAGE OF UTERUS  2005  . SKIN CANCER EXCISION    . TOTAL HIP ARTHROPLASTY Right 07/08/2014   Procedure: RIGHT TOTAL HIP ARTHROPLASTY ANTERIOR APPROACH;  Surgeon: Mcarthur Rossetti, MD;  Location: [facility];  Service: Orthopedics;  Laterality: Right;  . WISDOM TOOTH EXTRACTION       Current Meds  Medication Sig  . acetaminophen (TYLENOL) 500 MG tablet Take 1,000 mg by mouth every 6 (six) hours as needed (Pain).   [name] aspirin 81 MG tablet Take 81 mg by mouth daily.  [name] dicyclomine (BENTYL) 20 MG tablet Take 20 mg by mouth every 6 (six) hours as needed for spasms.  [name] ezetimibe (ZETIA) 10 MG tablet TAKE 1 TABLET (10 MG TOTAL) BY MOUTH DAILY.  . metoprolol tartrate (LOPRESSOR) 25 MG tablet Take 12.5 mg by mouth 2 (two) times daily.  . nitroGLYCERIN (NITROSTAT) 0.4 MG SL tablet Place 1 tablet (0.4 mg total) under the tongue every 5 (five) minutes as needed for chest pain.  . pantoprazole (PROTONIX) 40 MG tablet Take 1 tablet (40 mg total) by mouth daily before breakfast.  . Passion Flower-Valerian 500-500 MG CAPS Take 1 capsule by mouth daily.  . ramipril (ALTACE) 10 MG capsule Take 1 capsule (10 mg  total) by mouth daily.  . rosuvastatin (CRESTOR) 40 MG tablet Take 1 tablet (40 mg total) by mouth daily.  . Vitamin D, Ergocalciferol, (DRISDOL) 1.25 MG (50000 UNIT) CAPS capsule Take 1 capsule (50,000 Units total) by mouth every 7 (seven) days.  . [DISCONTINUED] dicyclomine (BENTYL) 20 MG tablet Take 1 tablet (20 mg total) by mouth every 6 (six) hours. (Patient taking differently: Take 20 mg by mouth every 6 (six) hours as needed. )     Allergies:   Erythromycin, Penicillins, and Shellfish allergy   Social History   Tobacco Use  . Smoking status: Never Smoker  . Smokeless tobacco: Never Used  Vaping Use  . Vaping Use: Never used  Substance  Use Topics  . Alcohol use: Yes    Alcohol/week: 7.0 standard drinks    Types: 7 Glasses of wine per week    Comment: 1 glass of wine/night  . Drug use: No     Family Hx: The patient's family history includes Heart attack in her paternal grandfather; Heart attack (age of onset: 57) in her father; Heart disease in her mother and sister; Hyperlipidemia in her father and mother; Hypertension in her father and mother; Obesity in her mother; Stroke in her mother; Sudden death in her father. There is no history of BRCA 1/2, Breast cancer, Esophageal cancer, Colon cancer, Pancreatic cancer, Liver disease, or Stomach cancer.  ROS:   Please see the history of present illness.     All other systems reviewed and are negative.   Prior CV studies:   The following studies were reviewed today:  Myoview 08/02/2016  The left ventricular ejection fraction is hyperdynamic (>65%).  Nuclear stress EF: 66%.  Blood pressure demonstrated a hypertensive response to exercise.  Horizontal ST segment depression ST segment depression of 1 mm was noted during stress in the II, III and aVF leads.  This is a low risk study.   ECG positive with 1 mm ST depression inferior leads peak stress and recovery However perfusion images normal with no ischemia or infarct EF  66% HTN response to exercise   Labs/Other Tests and Data Reviewed:    EKG:  An ECG dated 09/16/2019 was personally reviewed today and demonstrated:  Normal sinus rhythm without significant ST-T wave changes.  Recent Labs: 08/04/2019: ALT 23; BUN 31; Creatinine, Ser 0.96; Hemoglobin 13.3; Platelets 223; Potassium 4.5; Sodium 139; TSH 2.410   Recent Lipid Panel Lab Results  Component Value Date/Time   CHOL 165 08/04/2019 10:57 AM   TRIG 109 08/04/2019 10:57 AM   HDL 66 08/04/2019 10:57 AM   CHOLHDL 2.5 10/29/2016 10:30 AM   CHOLHDL 3 11/30/2015 10:28 AM   LDLCALC 80 08/04/2019 10:57 AM    Wt Readings from Last 3 Encounters:  11/12/19 150 lb (68 kg)  11/01/19 151 lb (68.5 kg)  09/27/19 145 lb (65.8 kg)     Objective:    Vital Signs:  BP 113/66   Pulse 61   Ht 5' (1.524 m)   Wt 150 lb (68 kg)   BMI 29.29 kg/m    VITAL SIGNS:  reviewed  ASSESSMENT & PLAN:    1. CAD: Continue on aspirin and Crestor.  Denies any recent chest pain  2. Hypertension: Blood pressure has been elevated recently, however it turned out the patient was not taking the metoprolol twice a day instead of she is taking 25 mg once a day.  She was instructed to split the metoprolol to 12.5 mg twice a day.  Her blood pressure seems to be more even now.  I decided to hold off on further up titration for the time being and reexamine her in 2 to 3 weeks.  3. Hyperlipidemia: Continue on Crestor  COVID-19 Education: The signs and symptoms of COVID-19 were discussed with the patient and how to seek care for testing (follow up with PCP or arrange E-visit).  The importance of social distancing was discussed today.  Time:   Today, I have spent 11 minutes with the patient with telehealth technology discussing the above problems.     Medication Adjustments/Labs and Tests Ordered: Current medicines are reviewed at length with the patient today.  Concerns regarding medicines are outlined above.  Tests Ordered: No  orders of the defined types were placed in this encounter.   Medication Changes: No orders of the defined types were placed in this encounter.   Follow Up:  In Person in 3 week(s)  Signed, Almyra Deforest, [place]  11/14/2019 7:42 PM     [facility]

## 2019-11-12 NOTE — Patient Instructions (Signed)
Medication Instructions:  Your Physician recommend you continue on your current medication as directed.    *If you need a refill on your cardiac medications before your next appointment, please call your pharmacy*   Lab Work: None ordered  Testing/Procedures: None ordered    Follow-Up: At [facility], you and your health needs are our priority.  As part of our continuing mission to provide you with exceptional heart care, we have created designated Provider Care Teams.  These Care Teams include your primary Cardiologist (physician) and Advanced Practice Providers (APPs -  Physician Assistants and Nurse Practitioners) who all work together to provide you with the care you need, when you need it.  We recommend signing up for the patient portal called "MyChart".  Sign up information is provided on this After Visit Summary.  MyChart is used to connect with patients for Virtual Visits (Telemedicine).  Patients are able to view lab/test results, encounter notes, upcoming appointments, etc.  Non-urgent messages can be sent to your provider as well.   To learn more about what you can do with MyChart, go to [email or web address].    Your next appointment:   3-4 week(s)  The format for your next appointment:   In Person  Provider:   Almyra Deforest, PA   Other Instructions Please keep a diary of your blood pressure numbers for the next 2 weeks.  Check twice a day- 1st reading 2 hours after morning medication. 2nd reading at the same time every night.

## 2019-11-14 ENCOUNTER — Encounter: Payer: Self-pay | Admitting: Physician Assistant

## 2019-12-01 ENCOUNTER — Encounter (INDEPENDENT_AMBULATORY_CARE_PROVIDER_SITE_OTHER): Payer: Self-pay | Admitting: Family Medicine

## 2019-12-01 ENCOUNTER — Other Ambulatory Visit: Payer: Self-pay

## 2019-12-01 ENCOUNTER — Ambulatory Visit (INDEPENDENT_AMBULATORY_CARE_PROVIDER_SITE_OTHER): Payer: Medicare HMO | Admitting: Family Medicine

## 2019-12-01 VITALS — BP 126/50 | HR 69 | Temp 97.6°F | Ht 60.0 in | Wt 150.0 lb

## 2019-12-01 DIAGNOSIS — E559 Vitamin D deficiency, unspecified: Secondary | ICD-10-CM

## 2019-12-01 DIAGNOSIS — R7303 Prediabetes: Secondary | ICD-10-CM | POA: Diagnosis not present

## 2019-12-01 DIAGNOSIS — E669 Obesity, unspecified: Secondary | ICD-10-CM | POA: Diagnosis not present

## 2019-12-01 DIAGNOSIS — Z6830 Body mass index (BMI) 30.0-30.9, adult: Secondary | ICD-10-CM

## 2019-12-01 DIAGNOSIS — E8881 Metabolic syndrome: Secondary | ICD-10-CM

## 2019-12-01 MED ORDER — VITAMIN D (ERGOCALCIFEROL) 1.25 MG (50000 UNIT) PO CAPS: 50000.0000 [IU] | ORAL_CAPSULE | ORAL | 0 refills | Status: DC

## 2019-12-02 LAB — COMPREHENSIVE METABOLIC PANEL
ALT: 32 IU/L (ref 0–32)
AST: 30 IU/L (ref 0–40)
Albumin/Globulin Ratio: 2.4 — ABNORMAL HIGH (ref 1.2–2.2)
Albumin: 4.6 g/dL (ref 3.8–4.8)
Alkaline Phosphatase: 66 IU/L (ref 44–121)
BUN/Creatinine Ratio: 24 (ref 12–28)
BUN: 20 mg/dL (ref 8–27)
Bilirubin Total: 0.4 mg/dL (ref 0.0–1.2)
CO2: 24 mmol/L (ref 20–29)
Calcium: 9.5 mg/dL (ref 8.7–10.3)
Chloride: 105 mmol/L (ref 96–106)
Creatinine, Ser: 0.84 mg/dL (ref 0.57–1.00)
GFR calc Af Amer: 81 mL/min/{1.73_m2} (ref >59)
GFR calc non Af Amer: 71 mL/min/{1.73_m2} (ref >59)
Globulin, Total: 1.9 g/dL (ref 1.5–4.5)
Glucose: 94 mg/dL (ref 65–99)
Potassium: 4.6 mmol/L (ref 3.5–5.2)
Sodium: 142 mmol/L (ref 134–144)
Total Protein: 6.5 g/dL (ref 6.0–8.5)

## 2019-12-02 LAB — INSULIN, RANDOM: INSULIN: 7.2 u[IU]/mL (ref 2.6–24.9)

## 2019-12-02 LAB — VITAMIN D 25 HYDROXY (VIT D DEFICIENCY, FRACTURES): Vit D, 25-Hydroxy: 68.5 ng/mL (ref 30.0–100.0)

## 2019-12-02 LAB — HEMOGLOBIN A1C
Est. average glucose Bld gHb Est-mCnc: 114 mg/dL
Hgb A1c MFr Bld: 5.6 % (ref 4.8–5.6)

## 2019-12-06 ENCOUNTER — Telehealth (INDEPENDENT_AMBULATORY_CARE_PROVIDER_SITE_OTHER): Payer: Medicare HMO | Admitting: Physician Assistant

## 2019-12-06 ENCOUNTER — Encounter: Payer: Self-pay | Admitting: Physician Assistant

## 2019-12-06 ENCOUNTER — Telehealth: Payer: Self-pay

## 2019-12-06 VITALS — BP 128/60 | HR 51 | Ht 60.0 in | Wt 150.0 lb

## 2019-12-06 DIAGNOSIS — I2510 Atherosclerotic heart disease of native coronary artery without angina pectoris: Secondary | ICD-10-CM

## 2019-12-06 DIAGNOSIS — E785 Hyperlipidemia, unspecified: Secondary | ICD-10-CM | POA: Diagnosis not present

## 2019-12-06 DIAGNOSIS — I10 Essential (primary) hypertension: Secondary | ICD-10-CM | POA: Diagnosis not present

## 2019-12-06 NOTE — Progress Notes (Signed)
° °Virtual Visit via Telephone Note  ° °This visit type was conducted due to national recommendations for restrictions regarding the COVID-19 Pandemic (e.g. social distancing) in an effort to limit this patient's exposure and mitigate transmission in our community.  Due to her co-morbid illnesses, this patient is at least at moderate risk for complications without adequate follow up.  This format is felt to be most appropriate for this patient at this time.  The patient did not have access to video technology/had technical difficulties with video requiring transitioning to audio format only (telephone).  All issues noted in this document were discussed and addressed.  No physical exam could be performed with this format.  Please refer to the patient's chart for her  consent to telehealth for [facility].  ° ° °Date:  12/08/2019  ° °ID:  Courtney [name], DOB 03/26/1950, MRN [id number] °The patient was identified using 2 identifiers. ° °Patient Location: Home °Provider Location: Office/Clinic ° °PCP:  Murray, Laura Woodruff, FNP  °Cardiologist:  Peter Jordan, MD  °Electrophysiologist:  None  ° °Evaluation Performed:  Follow-Up Visit ° °Chief Complaint:  Follow up ° °History of Present Illness:   ° °Courtney [name] is a 69 y.o. female with PMH of CAD, carotid artery disease, hyperlipidemia, hypertension and a history of PVCs.  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  Patient had a history of MI in 2009 and ended up having BMS placed in mid RCA.  In 2011, she had a mildly abnormal nuclear stress test.  Repeat cardiac catheterization at the time showed nonobstructive disease.  Repeat Myoview in October 2018 was normal.  I last saw the patient virtually on 11/12/2019 due to elevated blood pressure and a dizzy spell.  It turned out she was taking 25 mg daily of metoprolol tartrate instead of the recommended 12.5 mg twice a day dosing. °  °[name] presented for virtual visit today.  She is  feeling well without any chest pain or shortness of breath recently.  Her blood pressure has completely normalized.  Her heart rate is in the 50s range, there was a single low heart rate in the high 40s however she denies any symptom associated with bradycardia such as dizziness, blurred vision or feeling of passing out.  Since she is doing well, she can follow-up with Dr. Jordan in 1 year ° °The patient does not have symptoms concerning for COVID-19 infection (fever, chills, cough, or new shortness of breath).  ° ° °Past Medical History:  °Diagnosis Date  °• Aortic valve sclerosis   ° echo 12/2008, soft systolic murmur  °• Arthritis   ° in right hip  °• Back pain   °• Carotid artery disease ([facility])   ° 40-59% bilateral, doppler December 2010  °• Cataract   °• Constipation   °• Coronary artery disease   ° a. BMS-mid RCA 11/2007 b. stress echo 02/2009 subtle inferior HK, lateral ST depressions with stress c. follow-up cath 02/2009: RCA stent patent, severe but stable stenosis of distal posterior lateral branch RCA. LV normal, med tx unless more sx c. ETT Myoview 08/27/12: negative for ischemia; EF 64%  °• Decreased hearing   °• Depression   ° Mild, August, 2012  °• Dyslipidemia   °• Easy bruising   °• Ejection fraction   ° EF normal, catheterization, 2011 /  EF 55%, echo, 2010  °• Fluid overload   ° Mild, stable since hospitalization in the past  °• GERD (gastroesophageal reflux   gastroesophageal reflux disease)    occarionally, takes Pepcid over the counter   Heart murmur    has, occasional PVC's   History of right hip replacement    Hyperlipidemia    Hypertension    Joint pain    Myocardial infarction ([place])    2009   Palpitations    PVC (premature ventricular contraction)    per prior Holter monitor   Right hip pain 12/2009   Shellfish allergy    Trouble in sleeping    Vitamin B12 deficiency    Vitamin D deficiency    Past Surgical History:  Procedure Laterality Date   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/01/2005   BREAST  BIOPSY Left    carcinoid tumor removal     colon-   CARDIAC CATHETERIZATION  1/211   Patent RCA stent, stenotic PLB supplying small distribution; medically managed   CATARACT EXTRACTION, BILATERAL     CORONARY ANGIOPLASTY WITH STENT PLACEMENT  11/2007   BMS-mid RCA   DIAGNOSTIC LAPAROSCOPY     1982 for endometriosis   DILATION AND CURETTAGE OF UTERUS  2005   SKIN CANCER EXCISION     TOTAL HIP ARTHROPLASTY Right 07/08/2014   Procedure: RIGHT TOTAL HIP ARTHROPLASTY ANTERIOR APPROACH;  Surgeon: Mcarthur Rossetti, MD;  Location: [facility];  Service: Orthopedics;  Laterality: Right;   WISDOM TOOTH EXTRACTION       Current Meds  Medication Sig   acetaminophen (TYLENOL) 500 MG tablet Take 1,000 mg by mouth every 6 (six) hours as needed (Pain).    aspirin 81 MG tablet Take 81 mg by mouth daily.   dicyclomine (BENTYL) 20 MG tablet Take 20 mg by mouth every 6 (six) hours as needed for spasms.   ezetimibe (ZETIA) 10 MG tablet TAKE 1 TABLET (10 MG TOTAL) BY MOUTH DAILY.   metoprolol tartrate (LOPRESSOR) 25 MG tablet Take 12.5 mg by mouth 2 (two) times daily.   nitroGLYCERIN (NITROSTAT) 0.4 MG SL tablet Place 1 tablet (0.4 mg total) under the tongue every 5 (five) minutes as needed for chest pain.   pantoprazole (PROTONIX) 40 MG tablet Take 1 tablet (40 mg total) by mouth daily before breakfast.   Passion Flower-Valerian 500-500 MG CAPS Take 1 capsule by mouth daily.   ramipril (ALTACE) 10 MG capsule Take 1 capsule (10 mg total) by mouth daily.   rosuvastatin (CRESTOR) 40 MG tablet Take 1 tablet (40 mg total) by mouth daily.   Vitamin D, Ergocalciferol, (DRISDOL) 1.25 MG (50000 UNIT) CAPS capsule Take 1 capsule (50,000 Units total) by mouth every 7 (seven) days.     Allergies:   Erythromycin, Penicillins, and Shellfish allergy   Social History   Tobacco Use   Smoking status: Never Smoker   Smokeless tobacco: Never Used  Vaping Use   Vaping Use: Never used   Substance Use Topics   Alcohol use: Yes    Alcohol/week: 7.0 standard drinks    Types: 7 Glasses of wine per week    Comment: 1 glass of wine/night   Drug use: No     Family Hx: The patient's family history includes Heart attack in her paternal grandfather; Heart attack (age of onset: 38) in her father; Heart disease in her mother and sister; Hyperlipidemia in her father and mother; Hypertension in her father and mother; Obesity in her mother; Stroke in her mother; Sudden death in her father. There is no history of BRCA 1/2, Breast cancer, Esophageal cancer, Colon cancer, Pancreatic cancer, Liver disease, or Stomach cancer.  ROS:   Please see the history of present illness.     All other systems reviewed and are negative.   Prior CV studies:   The following studies were reviewed today:  Myoview 08/02/2016  The left ventricular ejection fraction is hyperdynamic (>65%).  Nuclear stress EF: 66%.  Blood pressure demonstrated a hypertensive response to exercise.  Horizontal ST segment depression ST segment depression of 1 mm was noted during stress in the II, III and aVF leads.  This is a low risk study.   ECG positive with 1 mm ST depression inferior leads peak stress and recovery However perfusion images normal with no ischemia or infarct EF 66% HTN response to exercise   Labs/Other Tests and Data Reviewed:    EKG:  An ECG dated 09/16/2019 was personally reviewed today and demonstrated:  Sinus bradycardia, no significant ST-T wave changes.  Recent Labs: 08/04/2019: Hemoglobin 13.3; Platelets 223; TSH 2.410 12/01/2019: ALT 32; BUN 20; Creatinine, Ser 0.84; Potassium 4.6; Sodium 142   Recent Lipid Panel Lab Results  Component Value Date/Time   CHOL 165 08/04/2019 10:57 AM   TRIG 109 08/04/2019 10:57 AM   HDL 66 08/04/2019 10:57 AM   CHOLHDL 2.5 10/29/2016 10:30 AM   CHOLHDL 3 11/30/2015 10:28 AM   LDLCALC 80 08/04/2019 10:57 AM    Wt Readings from Last 3  Encounters:  12/06/19 150 lb (68 kg)  12/01/19 150 lb (68 kg)  11/12/19 150 lb (68 kg)     Risk Assessment/Calculations:      Objective:    Vital Signs:  BP 128/60    Pulse (!) 51    Ht 5' (1.524 m)    Wt 150 lb (68 kg)    BMI 29.29 kg/m    VITAL SIGNS:  reviewed  ASSESSMENT & PLAN:    1. CAD: Continue aspirin and statin.  He denies any recent chest pain.  2. Hypertension: Blood pressure stable on current therapy  3. Hyperlipidemia: On Crestor.  COVID-19 Education: The signs and symptoms of COVID-19 were discussed with the patient and how to seek care for testing (follow up with PCP or arrange E-visit).  The importance of social distancing was discussed today.  Time:   Today, I have spent 6 minutes with the patient with telehealth technology discussing the above problems.     Medication Adjustments/Labs and Tests Ordered: Current medicines are reviewed at length with the patient today.  Concerns regarding medicines are outlined above.   Tests Ordered: No orders of the defined types were placed in this encounter.   Medication Changes: No orders of the defined types were placed in this encounter.   Follow Up:  In Person in 1 year(s)  Signed, Almyra Deforest, [place]  12/08/2019 11:36 PM    [facility]

## 2019-12-06 NOTE — Telephone Encounter (Signed)
Left a detailed voice message for the patient stating that I was calling to go over her medications and obtain her vitals if she was able to take them in the home before she has her virtual visit with Almyra Deforest, PA-C. Will try calling again

## 2019-12-13 NOTE — Progress Notes (Signed)
Chief Complaint:   OBESITY Courtney [name] is here to discuss her progress with her obesity treatment plan along with follow-up of her obesity related diagnoses. [name] is on the Category 1 Plan and states she is following her eating plan approximately 85% of the time. [name] states she is doing 0 minutes 0 times per week.  Today's visit was #: 16 Starting weight: 157 lbs Starting date: 06/16/2017 Today's weight: 150 lbs Today's date: 12/01/2019 Total lbs lost to date: 7 Total lbs lost since last in-office visit: 1  Interim History: [name] went on vacation last weekend and she has done well with weight loss. She is mindful of her food choices, and is working on increasing protein and increasing vegetables.  Subjective:   1. Vitamin D deficiency [name]'s Vit D level is at goal on Vit D prescription. She denies nausea or vomiting. She is due for labs.  2. Pre-diabetes [name] is doing well on diet, and she denies hypoglycemia. She is due for labs.  Assessment/Plan:   1. Vitamin D deficiency Low Vitamin D level contributes to fatigue and are associated with obesity, breast, and colon cancer. We will check labs today, and we will refill prescription Vitamin D for 1 month. Liller will follow-up for routine testing of Vitamin D, at least 2-3 times per year to avoid over-replacement.  - VITAMIN D 25 Hydroxy (Vit-D Deficiency, Fractures) - Vitamin D, Ergocalciferol, (DRISDOL) 1.25 MG (50000 UNIT) CAPS capsule; Take 1 capsule (50,000 Units total) by mouth every 7 (seven) days.  Dispense: 4 capsule; Refill: 0  2. Pre-diabetes [name] will continue to work on weight loss, diet, exercise, and decreasing simple carbohydrates to help decrease the risk of diabetes. We will check labs today.  - Comprehensive metabolic panel - Hemoglobin A1c - Insulin, random  3. Class 1 obesity with serious comorbidity and body mass index (BMI) of 30.0 to 30.9 in adult, unspecified obesity type [name] is currently in the  action stage of change. As such, her goal is to continue with weight loss efforts. She has agreed to the Category 1 Plan.   Fall recipes were given today.  Exercise goals: All adults should avoid inactivity. Some physical activity is better than none, and adults who participate in any amount of physical activity gain some health benefits.  Behavioral modification strategies: increasing lean protein intake and holiday eating strategies .  [name] has agreed to follow-up with our clinic in 4 weeks. She was informed of the importance of frequent follow-up visits to maximize her success with intensive lifestyle modifications for her multiple health conditions.   [name] was informed we would discuss her lab results at her next visit unless there is a critical issue that needs to be addressed sooner. [name] agreed to keep her next visit at the agreed upon time to discuss these results.  Objective:   Blood pressure (!) 126/50, pulse 69, temperature 97.6 F (36.4 C), height 5' (1.524 m), weight 150 lb (68 kg), SpO2 99 %. Body mass index is 29.29 kg/m.  General: Cooperative, alert, well developed, in no acute distress. HEENT: Conjunctivae and lids unremarkable. Cardiovascular: Regular rhythm.  Lungs: Normal work of breathing. Neurologic: No focal deficits.   Lab Results  Component Value Date   CREATININE 0.84 12/01/2019   BUN 20 12/01/2019   NA 142 12/01/2019   K 4.6 12/01/2019   CL 105 12/01/2019   CO2 24 12/01/2019   Lab Results  Component Value Date   ALT 32 12/01/2019  AST 30 12/01/2019   ALKPHOS 66 12/01/2019   BILITOT 0.4 12/01/2019   Lab Results  Component Value Date   HGBA1C 5.6 12/01/2019   HGBA1C 5.7 (H) 08/04/2019   HGBA1C 5.4 09/14/2018   HGBA1C 5.6 03/03/2018   HGBA1C 5.6 10/15/2017   Lab Results  Component Value Date   INSULIN 7.2 12/01/2019   INSULIN 4.9 08/04/2019   INSULIN 4.2 09/14/2018   INSULIN 5.0 03/03/2018   INSULIN 5.9 10/15/2017   Lab Results    Component Value Date   TSH 2.410 08/04/2019   Lab Results  Component Value Date   CHOL 165 08/04/2019   HDL 66 08/04/2019   LDLCALC 80 08/04/2019   TRIG 109 08/04/2019   CHOLHDL 2.5 10/29/2016   Lab Results  Component Value Date   WBC 8.1 08/04/2019   HGB 13.3 08/04/2019   HCT 39.7 08/04/2019   MCV 95 08/04/2019   PLT 223 08/04/2019   Lab Results  Component Value Date   IRON 100 01/25/2019   TIBC 328 01/25/2019   FERRITIN 54 01/25/2019    Obesity Behavioral Intervention:   Approximately 15 minutes were spent on the discussion below.  ASK: We discussed the diagnosis of obesity with [name] today and Jarelis agreed to give [place] permission to discuss obesity behavioral modification therapy today.  ASSESS: [name] has the diagnosis of obesity and her BMI today is 29.3. [name] is in the action stage of change.   ADVISE: [name] was educated on the multiple health risks of obesity as well as the benefit of weight loss to improve her health. She was advised of the need for long term treatment and the importance of lifestyle modifications to improve her current health and to decrease her risk of future health problems.  AGREE: Multiple dietary modification options and treatment options were discussed and [name] agreed to follow the recommendations documented in the above note.  ARRANGE: [name] was educated on the importance of frequent visits to treat obesity as outlined per CMS and USPSTF guidelines and agreed to schedule her next follow up appointment today.  Attestation Statements:   Reviewed by clinician on day of visit: allergies, medications, problem list, medical history, surgical history, family history, social history, and previous encounter notes.   I, Trixie Dredge, am acting as transcriptionist for Dennard Nip, MD.  I have reviewed the above documentation for accuracy and completeness, and I agree with the above. -  Dennard Nip, MD

## 2019-12-15 DIAGNOSIS — Z01419 Encounter for gynecological examination (general) (routine) without abnormal findings: Secondary | ICD-10-CM | POA: Diagnosis not present

## 2019-12-15 DIAGNOSIS — Z1239 Encounter for other screening for malignant neoplasm of breast: Secondary | ICD-10-CM | POA: Diagnosis not present

## 2019-12-15 DIAGNOSIS — R32 Unspecified urinary incontinence: Secondary | ICD-10-CM | POA: Diagnosis not present

## 2019-12-15 DIAGNOSIS — N951 Menopausal and female climacteric states: Secondary | ICD-10-CM | POA: Diagnosis not present

## 2019-12-21 ENCOUNTER — Encounter: Payer: Self-pay | Admitting: Family

## 2019-12-22 ENCOUNTER — Ambulatory Visit: Payer: Medicare HMO | Admitting: Family

## 2019-12-22 DIAGNOSIS — H524 Presbyopia: Secondary | ICD-10-CM | POA: Diagnosis not present

## 2019-12-29 ENCOUNTER — Ambulatory Visit (INDEPENDENT_AMBULATORY_CARE_PROVIDER_SITE_OTHER): Payer: Medicare HMO | Admitting: Family Medicine

## 2019-12-29 ENCOUNTER — Encounter (INDEPENDENT_AMBULATORY_CARE_PROVIDER_SITE_OTHER): Payer: Self-pay | Admitting: Family Medicine

## 2019-12-29 ENCOUNTER — Other Ambulatory Visit: Payer: Self-pay

## 2019-12-29 VITALS — BP 131/59 | HR 58 | Temp 97.9°F | Ht 60.0 in | Wt 150.0 lb

## 2019-12-29 DIAGNOSIS — E559 Vitamin D deficiency, unspecified: Secondary | ICD-10-CM | POA: Diagnosis not present

## 2019-12-29 DIAGNOSIS — F3289 Other specified depressive episodes: Secondary | ICD-10-CM

## 2019-12-29 DIAGNOSIS — E669 Obesity, unspecified: Secondary | ICD-10-CM

## 2019-12-29 DIAGNOSIS — M542 Cervicalgia: Secondary | ICD-10-CM

## 2019-12-29 DIAGNOSIS — M8589 Other specified disorders of bone density and structure, multiple sites: Secondary | ICD-10-CM | POA: Diagnosis not present

## 2019-12-29 DIAGNOSIS — Z6830 Body mass index (BMI) 30.0-30.9, adult: Secondary | ICD-10-CM | POA: Diagnosis not present

## 2019-12-29 MED ORDER — VITAMIN D (ERGOCALCIFEROL) 1.25 MG (50000 UNIT) PO CAPS: 50000.0000 [IU] | ORAL_CAPSULE | ORAL | 0 refills | Status: DC

## 2019-12-29 MED ORDER — BUPROPION HCL ER (SR) 150 MG PO TB12: 150.0000 mg | ORAL_TABLET | Freq: Every morning | ORAL | 0 refills | Status: DC

## 2019-12-29 NOTE — Progress Notes (Signed)
Chief Complaint:   OBESITY Courtney [name] is here to discuss her progress with her obesity treatment plan along with follow-up of her obesity related diagnoses. Courtney [name] is on the Category 1 Plan and states she is following her eating plan approximately 90% of the time. Courtney [name] states she is walking for 30 minutes 1 time per week.  Today's visit was #: 60 Starting weight: 157 lbs Starting date: 06/16/2017 Today's weight: 150 lbs Today's date: 12/29/2019 Total lbs lost to date: 7 Total lbs lost since last in-office visit: 0  Interim History: Courtney [name] has done well with maintaining her weight, but she is frustrated that she isn't losing weight especially since she is doing so many things right. She has tried to exercise but her pain is preventing her from this.  Subjective:   1. Vitamin D deficiency Courtney [name] is stable on Vit D and she denies nausea or vomiting. She requests a refill today.  2. Cervical pain Courtney [name] notes increased pain in her neck, and she denies improvement with Voltaren or heat or cold. She has not been formally evaluated for this but it is decreasing her ability to exercise.  3. Other depression with emotional eating Courtney [name] is getting frustrated with her inability to lose weight. She feels she is thinking about food frequently, and she is worried she is starting to obsess.   Assessment/Plan:   1. Vitamin D deficiency Low Vitamin D level contributes to fatigue and are associated with obesity, breast, and colon cancer. We will refill prescription Vitamin D for 1 month. Courtney [name] will follow-up for routine testing of Vitamin D, at least 2-3 times per year to avoid over-replacement.  - Vitamin D, Ergocalciferol, (DRISDOL) 1.25 MG (50000 UNIT) CAPS capsule; Take 1 capsule (50,000 Units total) by mouth every 7 (seven) days.  Dispense: 4 capsule; Refill: 0  2. Cervical pain We will refer to Courtney [name] for evaluation. Courtney [name] will continue to follow up as directed.  - Ambulatory referral to  Sports Medicine  3. Other depression with emotional eating Behavior modification techniques were discussed today to help Courtney [name] deal with her emotional/non-hunger eating behaviors. Courtney [name] agreed to start Courtney [name] SR 150 mg q AM with no refills. Orders and follow up as documented in patient record.   - buPROPion (Courtney [name] SR) 150 MG 12 hr tablet; Take 1 tablet (150 mg total) by mouth in the morning.  Dispense: 30 tablet; Refill: 0  4. Class 1 obesity with serious comorbidity and body mass index (BMI) of 30.0 to 30.9 in adult, unspecified obesity type Courtney [name] is currently in the action stage of change. As such, her goal is to continue with weight loss efforts. She has agreed to the Category 1 Plan or keeping a food journal and adhering to recommended goals of 1000-1200 calories and 75+ grams of protein daily.   We will plan to recheck her RMR at her next visit.  Exercise goals: Courtney [name] will work on her pain issues first, then try to increase strengthening.  Behavioral modification strategies: no skipping meals and meal planning and cooking strategies.  Courtney [name] has agreed to follow-up with our clinic in 3 weeks. She was informed of the importance of frequent follow-up visits to maximize her success with intensive lifestyle modifications for her multiple health conditions.   Objective:   Blood pressure (!) 131/59, pulse (!) 58, temperature 97.9 F (36.6 C), height 5' (1.524 m), weight 150 lb (68 kg), SpO2 97 %. Body mass index is 29.29 kg/m.  General: Cooperative, alert, well  developed, in no acute distress. HEENT: Conjunctivae and lids unremarkable. Cardiovascular: Regular rhythm.  Lungs: Normal work of breathing. Neurologic: No focal deficits.   Lab Results  Component Value Date   CREATININE 0.84 12/01/2019   BUN 20 12/01/2019   NA 142 12/01/2019   K 4.6 12/01/2019   CL 105 12/01/2019   CO2 24 12/01/2019   Lab Results  Component Value Date   ALT 32 12/01/2019   AST 30 12/01/2019     ALKPHOS 66 12/01/2019   BILITOT 0.4 12/01/2019   Lab Results  Component Value Date   HGBA1C 5.6 12/01/2019   HGBA1C 5.7 (H) 08/04/2019   HGBA1C 5.4 09/14/2018   HGBA1C 5.6 03/03/2018   HGBA1C 5.6 10/15/2017   Lab Results  Component Value Date   INSULIN 7.2 12/01/2019   INSULIN 4.9 08/04/2019   INSULIN 4.2 09/14/2018   INSULIN 5.0 03/03/2018   INSULIN 5.9 10/15/2017   Lab Results  Component Value Date   TSH 2.410 08/04/2019   Lab Results  Component Value Date   CHOL 165 08/04/2019   HDL 66 08/04/2019   LDLCALC 80 08/04/2019   TRIG 109 08/04/2019   CHOLHDL 2.5 10/29/2016   Lab Results  Component Value Date   WBC 8.1 08/04/2019   HGB 13.3 08/04/2019   HCT 39.7 08/04/2019   MCV 95 08/04/2019   PLT 223 08/04/2019   Lab Results  Component Value Date   IRON 100 01/25/2019   TIBC 328 01/25/2019   FERRITIN 54 01/25/2019   Obesity Behavioral Intervention:   Approximately 15 minutes were spent on the discussion below.  ASK: We discussed the diagnosis of obesity with Courtney [name] today and Courtney [name] agreed to give [place] permission to discuss obesity behavioral modification therapy today.  ASSESS: Courtney [name] has the diagnosis of obesity and her BMI today is 29.3. Courtney [name] is in the action stage of change.   ADVISE: Courtney [name] was educated on the multiple health risks of obesity as well as the benefit of weight loss to improve her health. She was advised of the need for long term treatment and the importance of lifestyle modifications to improve her current health and to decrease her risk of future health problems.  AGREE: Multiple dietary modification options and treatment options were discussed and Courtney [name] agreed to follow the recommendations documented in the above note.  ARRANGE: Courtney [name] was educated on the importance of frequent visits to treat obesity as outlined per CMS and USPSTF guidelines and agreed to schedule her next follow up appointment today.  Attestation Statements:    Reviewed by clinician on day of visit: allergies, medications, problem list, medical history, surgical history, family history, social history, and previous encounter notes.   I, Trixie Dredge, am acting as transcriptionist for Dennard Nip, MD.  I have reviewed the above documentation for accuracy and completeness, and I agree with the above. -  Dennard Nip, MD

## 2020-01-02 NOTE — Progress Notes (Signed)
[name] D.O. [facility] [address]  [address] Phone: [phone] Subjective:   I, Courtney [name], am serving as a scribe for Dr. Zachary Smith. This visit occurred during the SARS-CoV-2 public health emergency.  Safety protocols were in place, including screening questions prior to the visit, additional usage of staff PPE, and extensive cleaning of exam room while observing appropriate contact time as indicated for disinfecting solutions.   I'm seeing this patient by the request  of:  Murray, Laura Woodruff, FNP  CC: neck pain   HPI:Subjective  Courtney [name] is a 69 y.o. female coming in with complaint of neck pain. Patient states that she has had pain for past 2 months. Pain is worse in mornings. Ices in the morning typically. Pain radiates up into head. Uses Tylenol, IBU and Biofreeze for pain relief.  Patient states that not only the neck but her back seems to be give her trouble.  Sometimes can be anemia or even her joint replacement.  Patient states that it just seems to move.  Patient states that she is unable to be very active.  If she walks greater than 20 to 30 minutes starts having increasing discomfort and pain as well.      Past Medical History:  Diagnosis Date  . Aortic valve sclerosis    echo 12/2008, soft systolic murmur  . 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 Back pain   . Carotid artery disease (HCC)    40-59% bilateral, doppler December 2010  . Cataract   . Constipation   . Coronary artery disease    a. BMS-mid RCA 11/2007 b. stress echo 02/2009 subtle inferior HK, lateral ST depressions with stress c. follow-up cath 02/2009: RCA stent patent, severe but stable stenosis of distal posterior lateral branch RCA. LV normal, med tx unless more sx c. ETT Myoview 08/27/12: negative for ischemia; EF 64%  . Decreased hearing   . Depression    Mild, August, 2012  . Dyslipidemia   . Easy bruising   . Ejection fraction    EF normal, catheterization,  2011 /  EF 55%, echo, 2010  . Fluid overload    Mild, stable since hospitalization in the past  . GERD (gastroesophageal reflux disease)    occarionally, takes Pepcid over the counter  . Heart murmur    has, occasional PVC's  . History of right hip replacement   . Hyperlipidemia   . Hypertension   . Joint pain   . Myocardial infarction (HCC)    2009  . Palpitations   . PVC (premature ventricular contraction)    per prior Holter monitor  . Right hip pain 12/2009  . Shellfish allergy   . Trouble in sleeping   . Vitamin B12 deficiency   . Vitamin D deficiency    Past Surgical History:  Procedure Laterality Date  . 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/01/2005  . BREAST BIOPSY Left   . carcinoid tumor removal     colon-  . CARDIAC CATHETERIZATION  1/211   Patent RCA stent, stenotic PLB supplying small distribution; medically managed  . CATARACT EXTRACTION, BILATERAL    . CORONARY ANGIOPLASTY WITH STENT PLACEMENT  11/2007   BMS-mid RCA  . DIAGNOSTIC LAPAROSCOPY     1982 for endometriosis  . DILATION AND CURETTAGE OF UTERUS  2005  . SKIN CANCER EXCISION    . TOTAL HIP ARTHROPLASTY Right 07/08/2014   Procedure: RIGHT TOTAL HIP ARTHROPLASTY ANTERIOR APPROACH;  Surgeon: Christopher Y   Blackman, MD;  Location: [facility];  Service: Orthopedics;  Laterality: Right;  . WISDOM TOOTH EXTRACTION     Social History   Socioeconomic History  . Marital status: Divorced    Spouse name: Not on file  . Number of children: 1  . Years of education: 18  . Highest education level: Not on file  Occupational History  . Occupation: Retired - Nursing Director  Tobacco Use  . Smoking status: Never Smoker  . Smokeless tobacco: Never Used  Vaping Use  . Vaping Use: Never used  Substance and Sexual Activity  . Alcohol use: Yes    Alcohol/week: 7.0 standard drinks    Types: 7 Glasses of wine per week    Comment: 1 glass of wine/night  . Drug use: No  . Sexual activity: Yes  Other Topics Concern  . Not on file   Social History Narrative   Fun: Dance, garden,    Denies religious beliefs effecting health care.   Denies abuse and feels safe at home   Social Determinants of Health   Financial Resource Strain:   . Difficulty of Paying Living Expenses: Not on file  Food Insecurity:   . Worried About Running Out of Food in the Last Year: Not on file  . Ran Out of Food in the Last Year: Not on file  Transportation Needs:   . Lack of Transportation (Medical): Not on file  . Lack of Transportation (Non-Medical): Not on file  Physical Activity:   . Days of Exercise per Week: Not on file  . Minutes of Exercise per Session: Not on file  Stress:   . Feeling of Stress : Not on file  Social Connections:   . Frequency of Communication with Friends and Family: Not on file  . Frequency of Social Gatherings with Friends and Family: Not on file  . Attends Religious Services: Not on file  . Active Member of Clubs or Organizations: Not on file  . Attends Club or Organization Meetings: Not on file  . Marital Status: Not on file   Allergies  Allergen Reactions  . Erythromycin Hives  . Penicillins Hives    Has patient had a PCN reaction causing immediate rash, facial/tongue/throat swelling, SOB or lightheadedness with hypotension: No Has patient had a PCN reaction causing severe rash involving mucus membranes or skin necrosis: No Has patient had a PCN reaction that required hospitalization: No Has patient had a PCN reaction occurring within the last 10 years: No If all of the above answers are "NO", then may proceed with Cephalosporin use.   . Shellfish Allergy Hives   Family History  Problem Relation Age of Onset  . Heart attack Father 70  . Hyperlipidemia Father   . Hypertension Father   . Sudden death Father   . Stroke Mother   . Hyperlipidemia Mother   . Hypertension Mother   . Heart disease Mother   . Obesity Mother   . Heart attack Paternal Grandfather   . Heart disease Sister   . BRCA 1/2  Neg Hx   . Breast cancer Neg Hx   . Esophageal cancer Neg Hx   . Colon cancer Neg Hx   . Pancreatic cancer Neg Hx   . Liver disease Neg Hx   . Stomach cancer Neg Hx      Current Outpatient Medications (Cardiovascular):  .  ezetimibe (ZETIA) 10 MG tablet, TAKE 1 TABLET (10 MG TOTAL) BY MOUTH DAILY. .  metoprolol tartrate (LOPRESSOR) 25 MG tablet,   Take 12.5 mg by mouth 2 (two) times daily. .  nitroGLYCERIN (NITROSTAT) 0.4 MG SL tablet, Place 1 tablet (0.4 mg total) under the tongue every 5 (five) minutes as needed for chest pain. .  ramipril (ALTACE) 10 MG capsule, Take 1 capsule (10 mg total) by mouth daily. .  rosuvastatin (CRESTOR) 40 MG tablet, Take 1 tablet (40 mg total) by mouth daily.   Current Outpatient Medications (Analgesics):  .  acetaminophen (TYLENOL) 500 MG tablet, Take 1,000 mg by mouth every 6 (six) hours as needed (Pain).  [name]  aspirin 81 MG tablet, Take 81 mg by mouth daily.   Current Outpatient Medications (Other):  [name]  buPROPion (WELLBUTRIN SR) 150 MG 12 hr tablet, Take 1 tablet (150 mg total) by mouth in the morning. .  dicyclomine (BENTYL) 20 MG tablet, Take 20 mg by mouth every 6 (six) hours as needed for spasms. .  pantoprazole (PROTONIX) 40 MG tablet, Take 1 tablet (40 mg total) by mouth daily before breakfast. .  Passion Flower-Valerian 500-500 MG CAPS, Take 1 capsule by mouth daily. .  Vitamin D, Ergocalciferol, (DRISDOL) 1.25 MG (50000 UNIT) CAPS capsule, Take 1 capsule (50,000 Units total) by mouth every 7 (seven) days.   Reviewed prior external information including notes and imaging from  primary care provider As well as notes that were available from care everywhere and other healthcare systems.  Past medical history, social, surgical and family history all reviewed in electronic medical record.  No pertanent information unless stated regarding to the chief complaint.   Review of Systems:  No headache, visual changes, nausea, vomiting, diarrhea,  constipation, dizziness, abdominal pain, skin rash, fevers, chills, night sweats, weight loss, swollen lymph nodes,  joint swelling, chest pain, shortness of breath, mood changes. POSITIVE muscle aches, body aches  Objective  There were no vitals taken for this visit.   General: No apparent distress alert and oriented x3 mood and affect normal, dressed appropriately.  HEENT: Pupils equal, extraocular movements intact  Respiratory: Patient's speak in full sentences and does not appear short of breath  Cardiovascular: No lower extremity edema, non tender, no erythema  Arthritic changes of multiple joints.  Patient does have tenderness to palpation diffusely.  More pain in the paraspinal musculature of the lumbar spine.  Significant tightness of the right hip in all range of motion and almost too much internal range of motion of the hip replacement.  Neurovascularly intact distally but also diffusely tender in some of the musculature.  Neck exam does have some loss of lordosis.  Lacks last 15 degrees of extension.  Negative Spurling's though mild crepitus noted.  5-5 strength of the upper extremities    Impression and Recommendations:     The above documentation has been reviewed and is accurate and complete Lyndal Pulley, DO

## 2020-01-03 ENCOUNTER — Other Ambulatory Visit: Payer: Self-pay

## 2020-01-03 ENCOUNTER — Ambulatory Visit (INDEPENDENT_AMBULATORY_CARE_PROVIDER_SITE_OTHER): Payer: Medicare HMO

## 2020-01-03 ENCOUNTER — Encounter: Payer: Self-pay | Admitting: Family Medicine

## 2020-01-03 ENCOUNTER — Ambulatory Visit: Payer: Medicare HMO | Admitting: Family Medicine

## 2020-01-03 VITALS — BP 118/66 | HR 53 | Ht 60.0 in | Wt 154.0 lb

## 2020-01-03 DIAGNOSIS — M542 Cervicalgia: Secondary | ICD-10-CM | POA: Diagnosis not present

## 2020-01-03 DIAGNOSIS — M8580 Other specified disorders of bone density and structure, unspecified site: Secondary | ICD-10-CM | POA: Diagnosis not present

## 2020-01-03 DIAGNOSIS — M2550 Pain in unspecified joint: Secondary | ICD-10-CM

## 2020-01-03 LAB — CBC WITH DIFFERENTIAL/PLATELET
Basophils Absolute: 0 10*3/uL (ref 0.0–0.1)
Basophils Relative: 0.5 % (ref 0.0–3.0)
Eosinophils Absolute: 0.3 10*3/uL (ref 0.0–0.7)
Eosinophils Relative: 4.1 % (ref 0.0–5.0)
HCT: 37.7 % (ref 36.0–46.0)
Hemoglobin: 13 g/dL (ref 12.0–15.0)
Lymphocytes Relative: 39.6 % (ref 12.0–46.0)
Lymphs Abs: 2.9 10*3/uL (ref 0.7–4.0)
MCHC: 34.4 g/dL (ref 30.0–36.0)
MCV: 91.5 fl (ref 78.0–100.0)
Monocytes Absolute: 0.4 10*3/uL (ref 0.1–1.0)
Monocytes Relative: 5.5 % (ref 3.0–12.0)
Neutro Abs: 3.7 10*3/uL (ref 1.4–7.7)
Neutrophils Relative %: 50.3 % (ref 43.0–77.0)
Platelets: 196 10*3/uL (ref 150.0–400.0)
RBC: 4.12 Mil/uL (ref 3.87–5.11)
RDW: 12.7 % (ref 11.5–15.5)
WBC: 7.3 10*3/uL (ref 4.0–10.5)

## 2020-01-03 LAB — TSH: TSH: 1.75 u[IU]/mL (ref 0.35–4.50)

## 2020-01-03 LAB — IBC PANEL
Iron: 114 ug/dL (ref 42–145)
Saturation Ratios: 32.4 % (ref 20.0–50.0)
Transferrin: 251 mg/dL (ref 212.0–360.0)

## 2020-01-03 LAB — C-REACTIVE PROTEIN: CRP: <1 mg/dL (ref 0.5–20.0)

## 2020-01-03 LAB — SEDIMENTATION RATE: Sed Rate: 7 mm/hr (ref 0–30)

## 2020-01-03 MED ORDER — GABAPENTIN 100 MG PO CAPS: 200.0000 mg | ORAL_CAPSULE | Freq: Every day | ORAL | 0 refills | Status: DC

## 2020-01-03 NOTE — Assessment & Plan Note (Signed)
Encourage the once weekly vitamin D.  We will continue to monitor.  Will do more weightbearing exercises when patient is feeling better.

## 2020-01-03 NOTE — Patient Instructions (Addendum)
Xray today Tart cherry extract 1200mg  at night Gabapentin 200mg  at night Labs today See me in 4-6 weeks

## 2020-01-03 NOTE — Assessment & Plan Note (Signed)
Significant aches and pains at this time.  Discussed icing regimen and home exercises.  We discussed laboratory work-up.  X-rays of the cervical and lumbar spine ordered today.  Patient is usually very active but is finding it very difficult at the moment.  Follow-up with me again 4 weeks

## 2020-01-04 LAB — ANA: Anti Nuclear Antibody (ANA): NEGATIVE

## 2020-01-04 LAB — RHEUMATOID FACTOR: Rheumatoid fact SerPl-aCnc: <14 IU/mL (ref <14)

## 2020-01-07 DIAGNOSIS — F4323 Adjustment disorder with mixed anxiety and depressed mood: Secondary | ICD-10-CM | POA: Diagnosis not present

## 2020-01-10 DIAGNOSIS — H26491 Other secondary cataract, right eye: Secondary | ICD-10-CM | POA: Diagnosis not present

## 2020-01-10 DIAGNOSIS — Z961 Presence of intraocular lens: Secondary | ICD-10-CM | POA: Diagnosis not present

## 2020-01-10 DIAGNOSIS — H26493 Other secondary cataract, bilateral: Secondary | ICD-10-CM | POA: Diagnosis not present

## 2020-01-10 DIAGNOSIS — I10 Essential (primary) hypertension: Secondary | ICD-10-CM | POA: Diagnosis not present

## 2020-01-18 ENCOUNTER — Ambulatory Visit (INDEPENDENT_AMBULATORY_CARE_PROVIDER_SITE_OTHER): Payer: Medicare HMO | Admitting: Family Medicine

## 2020-01-24 DIAGNOSIS — H26492 Other secondary cataract, left eye: Secondary | ICD-10-CM | POA: Diagnosis not present

## 2020-01-26 ENCOUNTER — Other Ambulatory Visit: Payer: Self-pay

## 2020-01-26 ENCOUNTER — Ambulatory Visit (INDEPENDENT_AMBULATORY_CARE_PROVIDER_SITE_OTHER): Payer: Medicare HMO | Admitting: Family Medicine

## 2020-01-26 ENCOUNTER — Encounter (INDEPENDENT_AMBULATORY_CARE_PROVIDER_SITE_OTHER): Payer: Self-pay | Admitting: Family Medicine

## 2020-01-26 VITALS — BP 107/62 | HR 60 | Temp 97.6°F | Ht 60.0 in | Wt 146.0 lb

## 2020-01-26 DIAGNOSIS — F3289 Other specified depressive episodes: Secondary | ICD-10-CM

## 2020-01-26 DIAGNOSIS — E669 Obesity, unspecified: Secondary | ICD-10-CM | POA: Diagnosis not present

## 2020-01-26 DIAGNOSIS — E559 Vitamin D deficiency, unspecified: Secondary | ICD-10-CM

## 2020-01-26 DIAGNOSIS — R0602 Shortness of breath: Secondary | ICD-10-CM | POA: Diagnosis not present

## 2020-01-26 DIAGNOSIS — Z6830 Body mass index (BMI) 30.0-30.9, adult: Secondary | ICD-10-CM

## 2020-01-26 MED ORDER — VITAMIN D (ERGOCALCIFEROL) 1.25 MG (50000 UNIT) PO CAPS: 50000.0000 [IU] | ORAL_CAPSULE | ORAL | 0 refills | Status: DC

## 2020-01-26 MED ORDER — BUPROPION HCL ER (SR) 150 MG PO TB12: 150.0000 mg | ORAL_TABLET | Freq: Every morning | ORAL | 0 refills | Status: DC

## 2020-01-27 NOTE — Progress Notes (Signed)
Chief Complaint:   OBESITY Courtney [name] is here to discuss her progress with her obesity treatment plan along with follow-up of her obesity related diagnoses. Courtney [name] is on the Category 1 Plan or keeping a food journal and adhering to recommended goals of 1000-1200 calories and 75+ grams of protein daily and states she is following her eating plan approximately 95% of the time. Courtney [name] states she is gardening and walking for 20 minutes 1 time per week.  Today's visit was #: 12 Starting weight: 157 lbs Starting date: 06/16/2017 Today's weight: 146 lbs Today's date: 01/26/2020 Total lbs lost to date: 11 Total lbs lost since last in-office visit: 4  Interim History: Courtney [name] has done very well with weight loss even over Thanksgiving. She is working on decreasing temptations and she feels she is doing better avoiding emotional eating. Her hunger is controlled. She thinks she will do well with Christmas temptations.  Subjective:   1. Vitamin D deficiency Courtney [name] is stable on Vit D, and she denies nausea, vomiting, or muscle weakness.  2. SOB (shortness of breath) on exertion Courtney [name] notes no change in exercise induced shortness of breath. Her RMR has been lower than expected.  3. Other depression with emotional eating Courtney [name] started Wellbutrin and she is tolerating it well. She denies worsening insomnia. She feels she is doing better avoiding emotional eating.  Assessment/Plan:   1. Vitamin D deficiency Low Vitamin D level contributes to fatigue and are associated with obesity, breast, and colon cancer. We will refill prescription Vitamin D for 1 month. Courtney [name] will follow-up for routine testing of Vitamin D, at least 2-3 times per year to avoid over-replacement.  - Vitamin D, Ergocalciferol, (DRISDOL) 1.25 MG (50000 UNIT) CAPS capsule; Take 1 capsule (50,000 Units total) by mouth every 7 (seven) days.  Dispense: 4 capsule; Refill: 0  2. SOB (shortness of breath) on exertion Courtney [name]'s IC was repeated  today and shows improvement in RMR. We will continue to work on increasing exercise.  3. Other depression with emotional eating Behavior modification techniques were discussed today to help Courtney [name] deal with her emotional/non-hunger eating behaviors. We will refill Wellbutrin SR for 1 month. Orders and follow up as documented in patient record.   - buPROPion (WELLBUTRIN SR) 150 MG 12 hr tablet; Take 1 tablet (150 mg total) by mouth in the morning.  Dispense: 30 tablet; Refill: 0  4. Class 1 obesity with serious comorbidity and body mass index (BMI) of 30.0 to 30.9 in adult, unspecified obesity type Courtney [name] is currently in the action stage of change. As such, her goal is to continue with weight loss efforts. She has agreed to the Category 1 Plan.   Exercise goals: Consider adding strengthening exercise.  Behavioral modification strategies: holiday eating strategies .  Courtney [name] has agreed to follow-up with our clinic in 4 weeks. She was informed of the importance of frequent follow-up visits to maximize her success with intensive lifestyle modifications for her multiple health conditions.   Objective:   Blood pressure 107/62, pulse 60, temperature 97.6 F (36.4 C), height 5' (1.524 m), weight 146 lb (66.2 kg), SpO2 97 %. Body mass index is 28.51 kg/m.  General: Cooperative, alert, well developed, in no acute distress. HEENT: Conjunctivae and lids unremarkable. Cardiovascular: Regular rhythm.  Lungs: Normal work of breathing. Neurologic: No focal deficits.   Lab Results  Component Value Date   CREATININE 0.84 12/01/2019   BUN 20 12/01/2019   NA 142 12/01/2019   K 4.6 12/01/2019  CL 105 12/01/2019   CO2 24 12/01/2019   Lab Results  Component Value Date   ALT 32 12/01/2019   AST 30 12/01/2019   ALKPHOS 66 12/01/2019   BILITOT 0.4 12/01/2019   Lab Results  Component Value Date   HGBA1C 5.6 12/01/2019   HGBA1C 5.7 (H) 08/04/2019   HGBA1C 5.4 09/14/2018   HGBA1C 5.6 03/03/2018    HGBA1C 5.6 10/15/2017   Lab Results  Component Value Date   INSULIN 7.2 12/01/2019   INSULIN 4.9 08/04/2019   INSULIN 4.2 09/14/2018   INSULIN 5.0 03/03/2018   INSULIN 5.9 10/15/2017   Lab Results  Component Value Date   TSH 1.75 01/03/2020   Lab Results  Component Value Date   CHOL 165 08/04/2019   HDL 66 08/04/2019   LDLCALC 80 08/04/2019   TRIG 109 08/04/2019   CHOLHDL 2.5 10/29/2016   Lab Results  Component Value Date   WBC 7.3 01/03/2020   HGB 13.0 01/03/2020   HCT 37.7 01/03/2020   MCV 91.5 01/03/2020   PLT 196.0 01/03/2020   Lab Results  Component Value Date   IRON 114 01/03/2020   TIBC 328 01/25/2019   FERRITIN 54 01/25/2019    Obesity Behavioral Intervention:   Approximately 15 minutes were spent on the discussion below.  ASK: We discussed the diagnosis of obesity with Courtney [name] today and Jahne agreed to give [place] permission to discuss obesity behavioral modification therapy today.  ASSESS: Courtney [name] has the diagnosis of obesity and her BMI today is 28.51. Courtney [name] is in the action stage of change.   ADVISE: Courtney [name] was educated on the multiple health risks of obesity as well as the benefit of weight loss to improve her health. She was advised of the need for long term treatment and the importance of lifestyle modifications to improve her current health and to decrease her risk of future health problems.  AGREE: Multiple dietary modification options and treatment options were discussed and Courtney [name] agreed to follow the recommendations documented in the above note.  ARRANGE: Courtney [name] was educated on the importance of frequent visits to treat obesity as outlined per CMS and USPSTF guidelines and agreed to schedule her next follow up appointment today.  Attestation Statements:   Reviewed by clinician on day of visit: allergies, medications, problem list, medical history, surgical history, family history, social history, and previous encounter notes.   I, Trixie Dredge,  am acting as transcriptionist for Dennard Nip, MD.  I have reviewed the above documentation for accuracy and completeness, and I agree with the above. -  Dennard Nip, MD

## 2020-02-02 ENCOUNTER — Ambulatory Visit: Payer: Medicare HMO | Admitting: Family Medicine

## 2020-02-02 ENCOUNTER — Encounter: Payer: Self-pay | Admitting: Family Medicine

## 2020-02-02 ENCOUNTER — Other Ambulatory Visit: Payer: Self-pay

## 2020-02-02 ENCOUNTER — Ambulatory Visit (INDEPENDENT_AMBULATORY_CARE_PROVIDER_SITE_OTHER): Payer: Medicare HMO

## 2020-02-02 VITALS — BP 130/82 | HR 49 | Ht 60.0 in | Wt 154.0 lb

## 2020-02-02 DIAGNOSIS — M1711 Unilateral primary osteoarthritis, right knee: Secondary | ICD-10-CM | POA: Diagnosis not present

## 2020-02-02 DIAGNOSIS — M5030 Other cervical disc degeneration, unspecified cervical region: Secondary | ICD-10-CM | POA: Diagnosis not present

## 2020-02-02 DIAGNOSIS — M25551 Pain in right hip: Secondary | ICD-10-CM

## 2020-02-02 DIAGNOSIS — M1611 Unilateral primary osteoarthritis, right hip: Secondary | ICD-10-CM | POA: Diagnosis not present

## 2020-02-02 NOTE — Assessment & Plan Note (Signed)
History of this and did have a replacement.  Having some mild increase in groin pain.  We will get x-rays to make sure there is no instability or loosening.  Follow-up with me again though as needed with patient going into the holiday season.

## 2020-02-02 NOTE — Progress Notes (Signed)
[facility] [address] [place] Phone: [phone] Subjective:   I Courtney [name] am serving as a Education administrator for Dr. Hulan Saas.  This visit occurred during the SARS-CoV-2 public health emergency.  Safety protocols were in place, including screening questions prior to the visit, additional usage of staff PPE, and extensive cleaning of exam room while observing appropriate contact time as indicated for disinfecting solutions.   I'm seeing this patient by the request  of:  Marrian Salvage, FNP  CC:   [id number]   01/03/2020 Significant aches and pains at this time.  Discussed icing regimen and home exercises.  We discussed laboratory work-up.  X-rays of the cervical and lumbar spine ordered today.  Patient is usually very active but is finding it very difficult at the moment.  Follow-up with me again 4 weeks  Encourage the once weekly vitamin D.  We will continue to monitor.  Will do more weightbearing exercises when patient is feeling better.  Update 02/02/2020 Courtney [name] is a 69 y.o. female coming in with complaint of neck pain. Patient states she is much better. At 100% most days. Tight today.   Xray Cervical spine 01/03/2020 IMPRESSION: 1. No acute osseous abnormality identified in the cervical spine. 2. 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     Past Medical History:  Diagnosis Date  . Aortic valve sclerosis    echo 78/2423, soft systolic murmur  . 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 Back pain   . Carotid artery disease ([place])    40-59% bilateral, doppler December 2010  . Cataract   . Constipation   . Coronary artery disease    a. BMS-mid RCA 11/2007 b. stress echo 02/2009 subtle inferior HK, lateral ST depressions with stress c. follow-up cath 02/2009: RCA stent patent, severe but stable stenosis of distal posterior lateral branch RCA. LV normal, med tx unless more sx c. ETT Myoview  08/27/12: negative for ischemia; EF 64%  . Decreased hearing   . Depression    Mild, August, 2012  . Dyslipidemia   . Easy bruising   . Ejection fraction    EF normal, catheterization, 2011 /  EF 55%, echo, 2010  . Fluid overload    Mild, stable since hospitalization in the past  . GERD (gastroesophageal reflux disease)    occarionally, takes Pepcid over the counter  . Heart murmur    has, occasional PVC's  . History of right hip replacement   . Hyperlipidemia   . Hypertension   . Joint pain   . Myocardial infarction ([place])    2009  . Palpitations   . PVC (premature ventricular contraction)    per prior Holter monitor  . Right hip pain 12/2009  . Shellfish allergy   . Trouble in sleeping   . Vitamin B12 deficiency   . Vitamin D deficiency    Past Surgical History:  Procedure Laterality Date  . 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/01/2005  . BREAST BIOPSY Left   . carcinoid tumor removal     colon-  . CARDIAC CATHETERIZATION  1/211   Patent RCA stent, stenotic PLB supplying small distribution; medically managed  . CATARACT EXTRACTION, BILATERAL    . CORONARY ANGIOPLASTY WITH STENT PLACEMENT  11/2007   BMS-mid RCA  . DIAGNOSTIC LAPAROSCOPY     1982 for endometriosis  . DILATION AND CURETTAGE OF UTERUS  2005  . SKIN CANCER EXCISION    .  TOTAL HIP ARTHROPLASTY Right 07/08/2014   Procedure: RIGHT TOTAL HIP ARTHROPLASTY ANTERIOR APPROACH;  Surgeon: Mcarthur Rossetti, MD;  Location: [facility];  Service: Orthopedics;  Laterality: Right;  . WISDOM TOOTH EXTRACTION     Social History   Socioeconomic History  . Marital status: Divorced    Spouse name: Not on file  . Number of children: 1  . Years of education: 53  . Highest education level: Not on file  Occupational History  . Occupation: Retired - Retail buyer  Tobacco Use  . Smoking status: Never Smoker  . Smokeless tobacco: Never Used  Vaping Use  . Vaping Use: Never used  Substance and Sexual Activity  . Alcohol use: Yes     Alcohol/week: 7.0 standard drinks    Types: 7 Glasses of wine per week    Comment: 1 glass of wine/night  . Drug use: No  . Sexual activity: Yes  Other Topics Concern  . Not on file  Social History Narrative   Fun: Dance, garden,    Denies religious beliefs effecting health care.   Denies abuse and feels safe at home   Social Determinants of Health   Financial Resource Strain: Not on file  Food Insecurity: Not on file  Transportation Needs: Not on file  Physical Activity: Not on file  Stress: Not on file  Social Connections: Not on file   Allergies  Allergen Reactions  . Erythromycin Hives  . Penicillins Hives    Has patient had a PCN reaction causing immediate rash, facial/tongue/throat swelling, SOB or lightheadedness with hypotension: No Has patient had a PCN reaction causing severe rash involving mucus membranes or skin necrosis: No Has patient had a PCN reaction that required hospitalization: No Has patient had a PCN reaction occurring within the last 10 years: No If all of the above answers are "NO", then may proceed with Cephalosporin use.   . Shellfish Allergy Hives   Family History  Problem Relation Age of Onset  . Heart attack Father 25  . Hyperlipidemia Father   . Hypertension Father   . Sudden death Father   . Stroke Mother   . Hyperlipidemia Mother   . Hypertension Mother   . Heart disease Mother   . Obesity Mother   . Heart attack Paternal Grandfather   . Heart disease Sister   . BRCA 1/2 Neg Hx   . Breast cancer Neg Hx   . Esophageal cancer Neg Hx   . Colon cancer Neg Hx   . Pancreatic cancer Neg Hx   . Liver disease Neg Hx   . Stomach cancer Neg Hx      Current Outpatient Medications (Cardiovascular):  .  ezetimibe (ZETIA) 10 MG tablet, TAKE 1 TABLET (10 MG TOTAL) BY MOUTH DAILY. .  metoprolol tartrate (LOPRESSOR) 25 MG tablet, Take 12.5 mg by mouth 2 (two) times daily. .  nitroGLYCERIN (NITROSTAT) 0.4 MG SL tablet, Place 1 tablet (0.4 mg  total) under the tongue every 5 (five) minutes as needed for chest pain. .  ramipril (ALTACE) 10 MG capsule, Take 1 capsule (10 mg total) by mouth daily. .  rosuvastatin (CRESTOR) 40 MG tablet, Take 1 tablet (40 mg total) by mouth daily.   Current Outpatient Medications (Analgesics):  .  acetaminophen (TYLENOL) 500 MG tablet, Take 1,000 mg by mouth every 6 (six) hours as needed (Pain). [name]  aspirin 81 MG tablet, Take 81 mg by mouth daily.   Current Outpatient Medications (Other):  [name]  buPROPion [facility]  SR) 150 MG 12 hr tablet, Take 1 tablet (150 mg total) by mouth in the morning. .  dicyclomine (BENTYL) 20 MG tablet, Take 20 mg by mouth every 6 (six) hours as needed for spasms. [name]  gabapentin (NEURONTIN) 100 MG capsule, Take 2 capsules (200 mg total) by mouth at bedtime. .  pantoprazole (PROTONIX) 40 MG tablet, Take 1 tablet (40 mg total) by mouth daily before breakfast. .  Passion Flower-Valerian 500-500 MG CAPS, Take 1 capsule by mouth daily. .  Vitamin D, Ergocalciferol, (DRISDOL) 1.25 MG (50000 UNIT) CAPS capsule, Take 1 capsule (50,000 Units total) by mouth every 7 (seven) days.   Reviewed prior external information including notes and imaging from  primary care provider As well as notes that were available from care everywhere and other healthcare systems.  Past medical history, social, surgical and family history all reviewed in electronic medical record.  No pertanent information unless stated regarding to the chief complaint.   Review of Systems:  No headache, visual changes, nausea, vomiting, diarrhea, constipation, dizziness, abdominal pain, skin rash, fevers, chills, night sweats, weight loss, swollen lymph nodes, body aches, joint swelling, chest pain, shortness of breath, mood changes. POSITIVE muscle aches  Objective  Blood pressure 130/82, pulse (!) 49, height 5' (1.524 m), weight 154 lb (69.9 kg), SpO2 97 %.   General: No apparent distress alert and oriented x3 mood  and affect normal, dressed appropriately.  HEENT: Pupils equal, extraocular movements intact  Respiratory: Patient's speak in full sentences and does not appear short of breath  Cardiovascular: No lower extremity edema, non tender, no erythema  Gait normal with good balance and coordination.  MSK: Patient's neck exam has full range of motion.  Patient has negative Spurling's.  Patient has 5-5 strength of the upper extremities.  Deep tendon reflexes are all intact.  Right hip exam has no tenderness on exam.  Does have good range of motion and actually almost too much internal range of motion.  Does have some mild pain with external range of motion and mild lacking last 5 degrees.  Negative straight leg test.  Right knee exam does have some mild crepitus noted but otherwise good stability.    Impression and Recommendations:     The above documentation has been reviewed and is accurate and complete Courtney Pulley, DO

## 2020-02-02 NOTE — Assessment & Plan Note (Signed)
Patient does have degenerative disc disease.  Cervical.  Doing much better at this time and is nearly 100%.  The patient will continue with the exercises.  Gabapentin did help her with sleep initially but no longer needs.  We discussed she could try 300 mg and if so we can increase the dosing.  Patient was doing so well I do feel she can follow-up as needed

## 2020-02-02 NOTE — Patient Instructions (Signed)
Try 300 mg of gabapentin at night let me know if you like it I will send prescription Right hip and knee xray Try to do the exercises more regularly As long as you do well see me as needed

## 2020-02-02 NOTE — Assessment & Plan Note (Signed)
Known mild patellofemoral arthritis.  Would like the x-rays look to further confirm.  No instability and will monitor.

## 2020-02-14 DIAGNOSIS — Z0100 Encounter for examination of eyes and vision without abnormal findings: Secondary | ICD-10-CM | POA: Diagnosis not present

## 2020-02-23 ENCOUNTER — Encounter (INDEPENDENT_AMBULATORY_CARE_PROVIDER_SITE_OTHER): Payer: Self-pay | Admitting: Family Medicine

## 2020-02-23 ENCOUNTER — Telehealth (INDEPENDENT_AMBULATORY_CARE_PROVIDER_SITE_OTHER): Payer: Medicare HMO | Admitting: Family Medicine

## 2020-02-23 ENCOUNTER — Encounter (INDEPENDENT_AMBULATORY_CARE_PROVIDER_SITE_OTHER): Payer: Self-pay

## 2020-02-23 DIAGNOSIS — E669 Obesity, unspecified: Secondary | ICD-10-CM | POA: Diagnosis not present

## 2020-02-23 DIAGNOSIS — E559 Vitamin D deficiency, unspecified: Secondary | ICD-10-CM

## 2020-02-23 DIAGNOSIS — F3289 Other specified depressive episodes: Secondary | ICD-10-CM | POA: Diagnosis not present

## 2020-02-23 DIAGNOSIS — Z6830 Body mass index (BMI) 30.0-30.9, adult: Secondary | ICD-10-CM

## 2020-02-23 DIAGNOSIS — G4709 Other insomnia: Secondary | ICD-10-CM

## 2020-02-23 MED ORDER — BUPROPION HCL ER (SR) 150 MG PO TB12: 150.0000 mg | ORAL_TABLET | Freq: Every morning | ORAL | 0 refills | Status: DC

## 2020-02-23 MED ORDER — VITAMIN D (ERGOCALCIFEROL) 1.25 MG (50000 UNIT) PO CAPS: 50000.0000 [IU] | ORAL_CAPSULE | ORAL | 0 refills | Status: DC

## 2020-02-24 NOTE — Progress Notes (Signed)
TeleHealth Visit:  Due to the COVID-19 pandemic, this visit was completed with telemedicine (audio/video) technology to reduce patient and provider exposure as well as to preserve personal protective equipment.   Jozi has verbally consented to this TeleHealth visit. The patient is located at home, the provider is located at the [facility] office. The participants in this visit include the listed provider and patient. The visit was conducted today via MyChart video.    Chief Complaint: OBESITY Allycia is here to discuss her progress with her obesity treatment plan along with follow-up of her obesity related diagnoses. Suella is on the Category 1 Plan and states she is following her eating plan approximately 85% of the time. Kiaira states she is doing 0 minutes 0 times per week.  Today's visit was #: 41 Starting weight: 157 lbs Starting date: 06/16/2017  Interim History: Tejah did some celebration eating over the holidays, and she did well minimizing weight gain. She is getting bored with her lunch options and would like more choices.  Subjective:   1. Vitamin D deficiency Parissa is stable on Vit D, and she denies nausea or vomiting. She requests a refill today.  2. Other insomnia Taylyn struggles with insomnia both falling asleep, but especially staying asleep. She notes some daytime somnolence. She started [facility] for pain and thought it was helping for a short while but then it stopped even at a higher dose.  3. Other depression with emotional eating Aliha is doing well on medications, and she had forgotten to take it for a few days and noted her cravings had increased. She requests a refill today.  Assessment/Plan:   1. Vitamin D deficiency Low Vitamin D level contributes to fatigue and are associated with obesity, breast, and colon cancer. We will refill prescription Vitamin D for 1 month. Nikkia will follow-up for routine testing of Vitamin D, at least 2-3 times  per year to avoid over-replacement.  - Vitamin D, Ergocalciferol, (DRISDOL) 1.25 MG (50000 UNIT) CAPS capsule; Take 1 capsule (50,000 Units total) by mouth every 7 (seven) days.  Dispense: 4 capsule; Refill: 0  2. Other insomnia The problem of recurrent insomnia was discussed. Orders and follow up as documented in patient record. Counseling: Intensive lifestyle modifications are the first line treatment for this issue. We discussed several lifestyle modifications today. We will refer to Dr. Kandyce Rud for a sleep evaluation. Dorrie will continue to work on diet, exercise and weight loss efforts.   - Ambulatory referral to Neurology  3. Other depression with emotional eating Behavior modification techniques were discussed today to help Tyne deal with her emotional/non-hunger eating behaviors. We will refill Wellbutrin SR for 1 month. Orders and follow up as documented in patient record.   - buPROPion (WELLBUTRIN SR) 150 MG 12 hr tablet; Take 1 tablet (150 mg total) by mouth in the morning.  Dispense: 30 tablet; Refill: 0  4. Class 1 obesity with serious comorbidity and body mass index (BMI) of 30.0 to 30.9 in adult, unspecified obesity type Hettie is currently in the action stage of change. As such, her goal is to continue with weight loss efforts. She has agreed to the Category 2 Plan.   Category 2 lunch option handout was sent through MyChart.  Exercise goals: [place] videos were discussed.  Behavioral modification strategies: meal planning and cooking strategies.  Tyrisha has agreed to follow-up with our clinic in 4 weeks. She was informed of the importance of frequent follow-up visits to maximize  her success with intensive lifestyle modifications for her multiple health conditions.  Objective:   VITALS: Per patient if applicable, see vitals. GENERAL: Alert and in no acute distress. CARDIOPULMONARY: No increased WOB. Speaking in clear sentences.  PSYCH: Pleasant and cooperative.  Speech normal rate and rhythm. Affect is appropriate. Insight and judgement are appropriate. Attention is focused, linear, and appropriate.  NEURO: Oriented as arrived to appointment on time with no prompting.   Lab Results  Component Value Date   CREATININE 0.84 12/01/2019   BUN 20 12/01/2019   NA 142 12/01/2019   K 4.6 12/01/2019   CL 105 12/01/2019   CO2 24 12/01/2019   Lab Results  Component Value Date   ALT 32 12/01/2019   AST 30 12/01/2019   ALKPHOS 66 12/01/2019   BILITOT 0.4 12/01/2019   Lab Results  Component Value Date   HGBA1C 5.6 12/01/2019   HGBA1C 5.7 (H) 08/04/2019   HGBA1C 5.4 09/14/2018   HGBA1C 5.6 03/03/2018   HGBA1C 5.6 10/15/2017   Lab Results  Component Value Date   INSULIN 7.2 12/01/2019   INSULIN 4.9 08/04/2019   INSULIN 4.2 09/14/2018   INSULIN 5.0 03/03/2018   INSULIN 5.9 10/15/2017   Lab Results  Component Value Date   TSH 1.75 01/03/2020   Lab Results  Component Value Date   CHOL 165 08/04/2019   HDL 66 08/04/2019   LDLCALC 80 08/04/2019   TRIG 109 08/04/2019   CHOLHDL 2.5 10/29/2016   Lab Results  Component Value Date   WBC 7.3 01/03/2020   HGB 13.0 01/03/2020   HCT 37.7 01/03/2020   MCV 91.5 01/03/2020   PLT 196.0 01/03/2020   Lab Results  Component Value Date   IRON 114 01/03/2020   TIBC 328 01/25/2019   FERRITIN 54 01/25/2019    Attestation Statements:   Reviewed by clinician on day of visit: allergies, medications, problem list, medical history, surgical history, family history, social history, and previous encounter notes.   I, Trixie Dredge, am acting as transcriptionist for Dennard Nip, MD.  I have reviewed the above documentation for accuracy and completeness, and I agree with the above. - Dennard Nip, MD

## 2020-02-27 ENCOUNTER — Telehealth: Payer: Medicare HMO | Admitting: Nurse Practitioner

## 2020-02-27 DIAGNOSIS — J309 Allergic rhinitis, unspecified: Secondary | ICD-10-CM

## 2020-02-27 MED ORDER — FLUTICASONE PROPIONATE 50 MCG/ACT NA SUSP: 2.0000 | Freq: Every day | NASAL | 0 refills | Status: DC

## 2020-02-27 NOTE — Progress Notes (Signed)
E visit for Allergic Rhinitis We are sorry that you are not feeling well.  Here is how we plan to help!  Based on what you have shared with me it looks like you have Allergic Rhinitis.  Rhinitis is when a reaction occurs that causes nasal congestion, runny nose, sneezing, and itching.  Most types of rhinitis are caused by an inflammation and are associated with symptoms in the eyes ears or throat. There are several types of rhinitis.  The most common are acute rhinitis, which is usually caused by a viral illness, allergic or seasonal rhinitis, and nonallergic or year-round rhinitis.  Nasal allergies occur certain times of the year.  Allergic rhinitis is caused when allergens in the air trigger the release of histamine in the body.  Histamine causes itching, swelling, and fluid to build up in the fragile linings of the nasal passages, sinuses and eyelids.  An itchy nose and clear discharge are common.  I recommend the following over the counter treatments: Xyzal 5 mg take 1 tablet daily. You will need to take this medication at bedtime as it can cause drowsiness.  I also would recommend a nasal spray: Flonase 2 sprays into each nostril once daily  You may also benefit from eye drops such as: Visine 1-2 drops each eye twice daily as needed   For your cough, I recommend Mucinex plain, without DM since you are experiencing some side effects. If your symptoms do not improve within the next 3-5 days, antibiotics may need to be considered at that time. Continue Motrin or Tylenol as needed for throat discomfort along with the Chloraseptic spray.   HOME CARE:   You can use an over-the-counter saline nasal spray as needed  Avoid areas where there is heavy dust, mites, or molds  Stay indoors on windy days during the pollen season  Keep windows closed in home, at least in bedroom; use air conditioner.  Use high-efficiency house air filter  Keep windows closed in car, turn AC on  re-circulate  Avoid playing out with dog during pollen season  GET HELP RIGHT AWAY IF:   If your symptoms do not improve within 10 days  You become short of breath  You develop yellow or green discharge from your nose for over 3 days  You have coughing fits  MAKE SURE YOU:   Understand these instructions  Will watch your condition  Will get help right away if you are not doing well or get worse  Thank you for choosing an e-visit. Your e-visit answers were reviewed by a board certified advanced clinical practitioner to complete your personal care plan. Depending upon the condition, your plan could have included both over the counter or prescription medications. Please review your pharmacy choice. Be sure that the pharmacy you have chosen is open so that you can pick up your prescription now.  If there is a problem you may message your provider in [place] to have the prescription routed to another pharmacy. Your safety is important to [place]. If you have drug allergies check your prescription carefully.  For the next 24 hours, you can use MyChart to ask questions about today's visit, request a non-urgent call back, or ask for a work or school excuse from your e-visit provider. You will get an email in the next two days asking about your experience. I hope that your e-visit has been valuable and will speed your recovery.  I have spent at least 5 minutes reviewing and documenting in the  patient's chart.

## 2020-03-01 ENCOUNTER — Telehealth: Payer: Self-pay | Admitting: Family

## 2020-03-01 NOTE — Telephone Encounter (Signed)
LVM for pt to rtn my call to schedule AWV with [facility]. Please schedule this appt if pt calls the office.  °

## 2020-03-22 ENCOUNTER — Ambulatory Visit (INDEPENDENT_AMBULATORY_CARE_PROVIDER_SITE_OTHER): Payer: Medicare HMO

## 2020-03-22 ENCOUNTER — Other Ambulatory Visit: Payer: Self-pay

## 2020-03-22 VITALS — BP 128/60 | HR 54 | Temp 98.2°F | Ht 60.0 in | Wt 153.6 lb

## 2020-03-22 DIAGNOSIS — Z0000 Encounter for general adult medical examination without abnormal findings: Secondary | ICD-10-CM

## 2020-03-22 NOTE — Patient Instructions (Signed)
Courtney [name] , Thank you for taking time to come for your Medicare Wellness Visit. I appreciate your ongoing commitment to your health goals. Please review the following plan we discussed and let me know if I can assist you in the future.   Screening recommendations/referrals: Colonoscopy: 02/22/2013; due every 10 years (2025) Mammogram: 05/04/2019; due every 1-2 years Bone Density: 12/02/2017; due every 2 years Recommended yearly ophthalmology/optometry visit for glaucoma screening and checkup Recommended yearly dental visit for hygiene and checkup  Vaccinations: Influenza vaccine: 2021 Pneumococcal vaccine: up to date Tdap vaccine: 11/29/2015; due every 10 years Shingles vaccine: up to date   Covid-19: up to date  Advanced directives: Please bring a copy of your health care power of attorney and living will to the office at your convenience.  Conditions/risks identified: Yes; Reviewed health maintenance screenings with patient today and relevant education, vaccines, and/or referrals were provided. Please continue to do your personal lifestyle choices by: daily care of teeth and gums, regular physical activity (goal should be 5 days a week for 30 minutes), eat a healthy diet, avoid tobacco and drug use, limiting any alcohol intake, taking a low-dose aspirin (if not allergic or have been advised by your provider otherwise) and taking vitamins and minerals as recommended by your provider. Continue doing brain stimulating activities (puzzles, reading, adult coloring books, staying active) to keep memory sharp. Continue to eat heart healthy diet (full of fruits, vegetables, whole grains, lean protein, water--limit salt, fat, and sugar intake) and increase physical activity as tolerated.  Next appointment: Please schedule your next Medicare Wellness Visit with your Nurse Health Advisor in 1 year by calling [phone].   Preventive Care 70 Years and Older, Female Preventive care refers to lifestyle  choices and visits with your health care provider that can promote health and wellness. What does preventive care include?  A yearly physical exam. This is also called an annual well check.  Dental exams once or twice a year.  Routine eye exams. Ask your health care provider how often you should have your eyes checked.  Personal lifestyle choices, including:  Daily care of your teeth and gums.  Regular physical activity.  Eating a healthy diet.  Avoiding tobacco and drug use.  Limiting alcohol use.  Practicing safe sex.  Taking low-dose aspirin every day.  Taking vitamin and mineral supplements as recommended by your health care provider. What happens during an annual well check? The services and screenings done by your health care provider during your annual well check will depend on your age, overall health, lifestyle risk factors, and family history of disease. Counseling  Your health care provider may ask you questions about your:  Alcohol use.  Tobacco use.  Drug use.  Emotional well-being.  Home and relationship well-being.  Sexual activity.  Eating habits.  History of falls.  Memory and ability to understand (cognition).  Work and work Statistician.  Reproductive health. Screening  You may have the following tests or measurements:  Height, weight, and BMI.  Blood pressure.  Lipid and cholesterol levels. These may be checked every 5 years, or more frequently if you are over 9 years old.  Skin check.  Lung cancer screening. You may have this screening every year starting at age 79 if you have a 30-pack-year history of smoking and currently smoke or have quit within the past 15 years.  Fecal occult blood test (FOBT) of the stool. You may have this test every year starting at age 19.  Flexible  sigmoidoscopy or colonoscopy. You may have a sigmoidoscopy every 5 years or a colonoscopy every 10 years starting at age 43.  Hepatitis C blood  test.  Hepatitis B blood test.  Sexually transmitted disease (STD) testing.  Diabetes screening. This is done by checking your blood sugar (glucose) after you have not eaten for a while (fasting). You may have this done every 1-3 years.  Bone density scan. This is done to screen for osteoporosis. You may have this done starting at age 1.  Mammogram. This may be done every 1-2 years. Talk to your health care provider about how often you should have regular mammograms. Talk with your health care provider about your test results, treatment options, and if necessary, the need for more tests. Vaccines  Your health care provider may recommend certain vaccines, such as:  Influenza vaccine. This is recommended every year.  Tetanus, diphtheria, and acellular pertussis (Tdap, Td) vaccine. You may need a Td booster every 10 years.  Zoster vaccine. You may need this after age 60.  Pneumococcal 13-valent conjugate (PCV13) vaccine. One dose is recommended after age 34.  Pneumococcal polysaccharide (PPSV23) vaccine. One dose is recommended after age 51. Talk to your health care provider about which screenings and vaccines you need and how often you need them. This information is not intended to replace advice given to you by your health care provider. Make sure you discuss any questions you have with your health care provider. Document Released: 03/03/2015 Document Revised: 10/25/2015 Document Reviewed: 12/06/2014 Elsevier Interactive Patient Education  2017 [place] Prevention in the Home Falls can cause injuries. They can happen to people of all ages. There are many things you can do to make your home safe and to help prevent falls. What can I do on the outside of my home?  Regularly fix the edges of walkways and driveways and fix any cracks.  Remove anything that might make you trip as you walk through a door, such as a raised step or threshold.  Trim any bushes or trees on the  path to your home.  Use bright outdoor lighting.  Clear any walking paths of anything that might make someone trip, such as rocks or tools.  Regularly check to see if handrails are loose or broken. Make sure that both sides of any steps have handrails.  Any raised decks and porches should have guardrails on the edges.  Have any leaves, snow, or ice cleared regularly.  Use sand or salt on walking paths during winter.  Clean up any spills in your garage right away. This includes oil or grease spills. What can I do in the bathroom?  Use night lights.  Install grab bars by the toilet and in the tub and shower. Do not use towel bars as grab bars.  Use non-skid mats or decals in the tub or shower.  If you need to sit down in the shower, use a plastic, non-slip stool.  Keep the floor dry. Clean up any water that spills on the floor as soon as it happens.  Remove soap buildup in the tub or shower regularly.  Attach bath mats securely with double-sided non-slip rug tape.  Do not have throw rugs and other things on the floor that can make you trip. What can I do in the bedroom?  Use night lights.  Make sure that you have a light by your bed that is easy to reach.  Do not use any sheets or blankets that  are too big for your bed. They should not hang down onto the floor.  Have a firm chair that has side arms. You can use this for support while you get dressed.  Do not have throw rugs and other things on the floor that can make you trip. What can I do in the kitchen?  Clean up any spills right away.  Avoid walking on wet floors.  Keep items that you use a lot in easy-to-reach places.  If you need to reach something above you, use a strong step stool that has a grab bar.  Keep electrical cords out of the way.  Do not use floor polish or wax that makes floors slippery. If you must use wax, use non-skid floor wax.  Do not have throw rugs and other things on the floor that can  make you trip. What can I do with my stairs?  Do not leave any items on the stairs.  Make sure that there are handrails on both sides of the stairs and use them. Fix handrails that are broken or loose. Make sure that handrails are as long as the stairways.  Check any carpeting to make sure that it is firmly attached to the stairs. Fix any carpet that is loose or worn.  Avoid having throw rugs at the top or bottom of the stairs. If you do have throw rugs, attach them to the floor with carpet tape.  Make sure that you have a light switch at the top of the stairs and the bottom of the stairs. If you do not have them, ask someone to add them for you. What else can I do to help prevent falls?  Wear shoes that:  Do not have high heels.  Have rubber bottoms.  Are comfortable and fit you well.  Are closed at the toe. Do not wear sandals.  If you use a stepladder:  Make sure that it is fully opened. Do not climb a closed stepladder.  Make sure that both sides of the stepladder are locked into place.  Ask someone to hold it for you, if possible.  Clearly mark and make sure that you can see:  Any grab bars or handrails.  First and last steps.  Where the edge of each step is.  Use tools that help you move around (mobility aids) if they are needed. These include:  Canes.  Walkers.  Scooters.  Crutches.  Turn on the lights when you go into a dark area. Replace any light bulbs as soon as they burn out.  Set up your furniture so you have a clear path. Avoid moving your furniture around.  If any of your floors are uneven, fix them.  If there are any pets around you, be aware of where they are.  Review your medicines with your doctor. Some medicines can make you feel dizzy. This can increase your chance of falling. Ask your doctor what other things that you can do to help prevent falls. This information is not intended to replace advice given to you by your health care  provider. Make sure you discuss any questions you have with your health care provider. Document Released: 12/01/2008 Document Revised: 07/13/2015 Document Reviewed: 03/11/2014 Elsevier Interactive Patient Education  2017 [employer].

## 2020-03-22 NOTE — Progress Notes (Addendum)
Subjective:   Courtney [name] is a 70 y.o. female who presents for Medicare Annual (Subsequent) preventive examination.  Review of Systems    No ROS. Medicare Wellness Visit. Additional risk factors are reflected in social history. Cardiac Risk Factors include: advanced age (>44men, >17 women);dyslipidemia;family history of premature cardiovascular disease;obesity (BMI >30kg/m2)     Objective:    Today's Vitals   03/22/20 1101  BP: 128/60  Pulse: (!) 54  Temp: 98.2 F (36.8 C)  SpO2: 98%  Weight: 153 lb 9.6 oz (69.7 kg)  Height: 5' (1.524 m)  PainSc: 8   PainLoc: Back   Body mass index is 30 kg/m.  Advanced Directives 03/22/2020 09/29/2018 04/29/2018 10/14/2017 07/08/2014 07/01/2014  Does Patient Have a Medical Advance Directive? Yes Yes Yes Yes Yes Yes  Type of Advance Directive - [place];Living will [place];Living will [place];Living will Mental Health Advance Directive;Healthcare Power of [place]  Does patient want to make changes to medical advance directive? No - Patient declined No - Patient declined - - No - Patient declined No - Patient declined  Copy of [place] in Chart? - - No - copy requested - Yes Yes    Current Medications (verified) Outpatient Encounter Medications as of 03/22/2020  Medication Sig  . acetaminophen (TYLENOL) 500 MG tablet Take 1,000 mg by mouth every 6 (six) hours as needed (Pain).  [name] aspirin 81 MG tablet Take 81 mg by mouth daily.  [name] buPROPion (WELLBUTRIN SR) 150 MG 12 hr tablet Take 1 tablet (150 mg total) by mouth in the morning.  . dicyclomine (BENTYL) 20 MG tablet Take 20 mg by mouth every 6 (six) hours as needed for spasms.  [name] ezetimibe (ZETIA) 10 MG tablet TAKE 1 TABLET (10 MG TOTAL) BY MOUTH DAILY.  . fluticasone (FLONASE) 50 MCG/ACT nasal spray Place 2 sprays into both nostrils daily for 14 days.  [name] gabapentin (NEURONTIN) 100 MG  capsule Take 2 capsules (200 mg total) by mouth at bedtime.  . metoprolol tartrate (LOPRESSOR) 25 MG tablet Take 12.5 mg by mouth 2 (two) times daily.  . nitroGLYCERIN (NITROSTAT) 0.4 MG SL tablet Place 1 tablet (0.4 mg total) under the tongue every 5 (five) minutes as needed for chest pain.  . pantoprazole (PROTONIX) 40 MG tablet Take 1 tablet (40 mg total) by mouth daily before breakfast.  . Passion Flower-Valerian 500-500 MG CAPS Take 1 capsule by mouth daily.  . ramipril (ALTACE) 10 MG capsule Take 1 capsule (10 mg total) by mouth daily.  . rosuvastatin (CRESTOR) 40 MG tablet Take 1 tablet (40 mg total) by mouth daily.  . Vitamin D, Ergocalciferol, (DRISDOL) 1.25 MG (50000 UNIT) CAPS capsule Take 1 capsule (50,000 Units total) by mouth every 7 (seven) days.   No facility-administered encounter medications on file as of 03/22/2020.    Allergies (verified) Erythromycin, Penicillins, and Shellfish allergy   History: Past Medical History:  Diagnosis Date  . Aortic valve sclerosis    echo 73/4193, soft systolic murmur  . 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 Back pain   . Carotid artery disease ([place])    40-59% bilateral, doppler December 2010  . Cataract   . Constipation   . Coronary artery disease    a. BMS-mid RCA 11/2007 b. stress echo 02/2009 subtle inferior HK, lateral ST depressions with stress c. follow-up cath 02/2009: RCA stent patent, severe but stable stenosis  of distal posterior lateral branch RCA. LV normal, med tx unless more sx c. ETT Myoview 08/27/12: negative for ischemia; EF 64%  . Decreased hearing   . Depression    Mild, August, 2012  . Dyslipidemia   . Easy bruising   . Ejection fraction    EF normal, catheterization, 2011 /  EF 55%, echo, 2010  . Fluid overload    Mild, stable since hospitalization in the past  . GERD (gastroesophageal reflux disease)    occarionally, takes Pepcid over the counter  . Heart murmur    has, occasional PVC's  . History of right hip  replacement   . Hyperlipidemia   . Hypertension   . Joint pain   . Myocardial infarction (HCC)    2009  . Palpitations   . PVC (premature ventricular contraction)    per prior Holter monitor  . Right hip pain 12/2009  . Shellfish allergy   . Trouble in sleeping   . Vitamin B12 deficiency   . Vitamin D deficiency    Past Surgical History:  Procedure Laterality Date  . 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/01/2005  . BREAST BIOPSY Left   . carcinoid tumor removal     colon-  . CARDIAC CATHETERIZATION  1/211   Patent RCA stent, stenotic PLB supplying small distribution; medically managed  . CATARACT EXTRACTION, BILATERAL    . CORONARY ANGIOPLASTY WITH STENT PLACEMENT  11/2007   BMS-mid RCA  . DIAGNOSTIC LAPAROSCOPY     1982 for endometriosis  . DILATION AND CURETTAGE OF UTERUS  2005  . SKIN CANCER EXCISION    . TOTAL HIP ARTHROPLASTY Right 07/08/2014   Procedure: RIGHT TOTAL HIP ARTHROPLASTY ANTERIOR APPROACH;  Surgeon: Kathryne Hitch, MD;  Location: [facility];  Service: Orthopedics;  Laterality: Right;  . WISDOM TOOTH EXTRACTION     Family History  Problem Relation Age of Onset  . Heart attack Father 28  . Hyperlipidemia Father   . Hypertension Father   . Sudden death Father   . Stroke Mother   . Hyperlipidemia Mother   . Hypertension Mother   . Heart disease Mother   . Obesity Mother   . Heart attack Paternal Grandfather   . Heart disease Sister   . BRCA 1/2 Neg Hx   . Breast cancer Neg Hx   . Esophageal cancer Neg Hx   . Colon cancer Neg Hx   . Pancreatic cancer Neg Hx   . Liver disease Neg Hx   . Stomach cancer Neg Hx    Social History   Socioeconomic History  . Marital status: Divorced    Spouse name: Not on file  . Number of children: 1  . Years of education: 34  . Highest education level: Not on file  Occupational History  . Occupation: Retired - Publishing copy  Tobacco Use  . Smoking status: Never Smoker  . Smokeless tobacco: Never Used  Vaping Use  .  Vaping Use: Never used  Substance and Sexual Activity  . Alcohol use: Yes    Alcohol/week: 7.0 standard drinks    Types: 7 Glasses of wine per week    Comment: 1 glass of wine/night  . Drug use: No  . Sexual activity: Yes  Other Topics Concern  . Not on file  Social History Narrative   Fun: Dance, garden,    Denies religious beliefs effecting health care.   Denies abuse and feels safe at home   Social Determinants of Corporate investment banker  Strain: Low Risk   . Difficulty of Paying Living Expenses: Not hard at all  Food Insecurity: No Food Insecurity  . Worried About Charity fundraiser in the Last Year: Never true  . Ran Out of Food in the Last Year: Never true  Transportation Needs: No Transportation Needs  . Lack of Transportation (Medical): No  . Lack of Transportation (Non-Medical): No  Physical Activity: Sufficiently Active  . Days of Exercise per Week: 5 days  . Minutes of Exercise per Session: 30 min  Stress: No Stress Concern Present  . Feeling of Stress : Not at all  Social Connections: Moderately Isolated  . Frequency of Communication with Friends and Family: More than three times a week  . Frequency of Social Gatherings with Friends and Family: More than three times a week  . Attends Religious Services: Never  . Active Member of Clubs or Organizations: No  . Attends Archivist Meetings: Never  . Marital Status: Living with partner    Tobacco Counseling Counseling given: Not Answered   Clinical Intake:  Pre-visit preparation completed: Yes  Pain : 0-10 Pain Score: 8  Pain Type: Chronic pain Pain Location: Back Pain Orientation: Lower Pain Radiating Towards: right side hip Pain Descriptors / Indicators: Discomfort,Throbbing Pain Onset: More than a month ago Pain Frequency: Intermittent Pain Relieving Factors: applying heat and otc ibuprofen Effect of Pain on Daily Activities: Pain can diminish job performance, lower motivation to  exercise, and prevent you from completing daily tasks. Pain produces disability and affects the quality of life.  Pain Relieving Factors: applying heat and otc ibuprofen  BMI - recorded: 30 Nutritional Status: BMI > 30  Obese Nutritional Risks: None Diabetes: No  How often do you need to have someone help you when you read instructions, pamphlets, or other written materials from your doctor or pharmacy?: 1 - Never What is the last grade level you completed in school?: Master's Degree from [employer]  Diabetic? no  Interpreter Needed?: No  Information entered by :: Lisette Abu, LPN   Activities of Daily Living In your present state of health, do you have any difficulty performing the following activities: 03/22/2020  Hearing? Y  Vision? N  Difficulty concentrating or making decisions? N  Walking or climbing stairs? N  Dressing or bathing? N  Doing errands, shopping? N  Preparing Food and eating ? N  Using the Toilet? N  In the past six months, have you accidently leaked urine? N  Do you have problems with loss of bowel control? N  Managing your Medications? N  Managing your Finances? N  Housekeeping or managing your Housekeeping? N  Some recent data might be hidden    Patient Care Team: Marrian Salvage, FNP as PCP - General (Internal Medicine) [place], Peter M, MD as PCP - Cardiology (Cardiology) Starlyn Skeans, MD as Consulting Physician (Family Medicine) [place], Peter M, MD as Consulting Physician (Cardiology)  Indicate any recent Medical Services you may have received from other than [facility] providers in the past year (date may be approximate).     Assessment:   This is a routine wellness examination for [place].  Hearing/Vision screen No exam data present  Dietary issues and exercise activities discussed: Current Exercise Habits: Home exercise routine, Type of exercise: walking, Time (Minutes): 30, Frequency (Times/Week): 5, Weekly Exercise (Minutes/Week):  150, Exercise limited by: orthopedic condition(s);cardiac condition(s)  Goals    . Patient Stated     Get back to walking more routinely  and maintain my weight loss.     . Patient Stated     Increase exercises in weight bearing, endurance, balance, flexibility and strengthening.      Depression Screen PHQ 2/9 Scores 03/22/2020 04/29/2018 06/16/2017 02/25/2017 01/06/2015  PHQ - 2 Score 0 0 2 0 0  PHQ- 9 Score - 3 5 - -    Fall Risk Fall Risk  03/22/2020 04/29/2018 02/25/2017 01/06/2015  Falls in the past year? 0 0 No No  Number falls in past yr: 0 0 - -  Injury with Fall? 0 - - -  Risk for fall due to : No Fall Risks - - -  Follow up Falls evaluation completed - - -    FALL RISK PREVENTION PERTAINING TO THE HOME:  Any stairs in or around the home? Yes  If so, are there any without handrails? No  Home free of loose throw rugs in walkways, pet beds, electrical cords, etc? Yes  Adequate lighting in your home to reduce risk of falls? Yes   ASSISTIVE DEVICES UTILIZED TO PREVENT FALLS:  Life alert? No  Use of a cane, walker or w/c? No  Grab bars in the bathroom? Yes  Shower chair or bench in shower? Yes  Elevated toilet seat or a handicapped toilet? No   TIMED UP AND GO:  Was the test performed? No .  Length of time to ambulate 10 feet: 0 sec.   Gait steady and fast without use of assistive device  Cognitive Function: Normal cognitive status assessed by direct observation by this Nurse Health Advisor. No abnormalities found.         Immunizations Immunization History  Administered Date(s) Administered  . Fluad Quad(high Dose 65+) 10/20/2018  . Influenza, High Dose Seasonal PF 12/03/2016, 11/17/2017  . Influenza-Unspecified 10/20/2014  . PFIZER(Purple Top)SARS-COV-2 Vaccination 02/26/2019, 03/17/2019  . Pneumococcal Conjugate-13 02/18/2014, 01/06/2015  . Pneumococcal Polysaccharide-23 11/29/2015  . Pneumococcal-Unspecified 02/18/2014  . Tdap 11/29/2015  . Zoster Recombinat  (Shingrix) 12/26/2016, 02/25/2017    TDAP status: Up to date  Flu Vaccine status: Up to date  Pneumococcal vaccine status: Up to date  Covid-19 vaccine status: Completed vaccines  Qualifies for Shingles Vaccine? Yes   Zostavax completed Yes   Shingrix Completed?: Yes  Screening Tests Health Maintenance  Topic Date Due  . COVID-19 Vaccine (3 - Pfizer risk 4-dose series) 04/14/2019  . INFLUENZA VACCINE  09/19/2019  . PAP SMEAR-Modifier  11/23/2019  . MAMMOGRAM  05/03/2021  . COLONOSCOPY (Pts 45-48yrs Insurance coverage will need to be confirmed)  02/23/2023  . TETANUS/TDAP  11/28/2025  . DEXA SCAN  Completed  . Hepatitis C Screening  Completed  . PNA vac Low Risk Adult  Completed    Health Maintenance  Health Maintenance Due  Topic Date Due  . COVID-19 Vaccine (3 - Pfizer risk 4-dose series) 04/14/2019  . INFLUENZA VACCINE  09/19/2019  . PAP SMEAR-Modifier  11/23/2019    Colorectal cancer screening: Type of screening: Colonoscopy. Completed 02/22/2013. Repeat every 10 years  Mammogram status: Completed 05/04/2019. Repeat every year  Bone Density status: Completed 12/02/2017. Results reflect: Bone density results: OSTEOPENIA. Repeat every 2 years. Per patient done at OB/GYN office 2021; need documentation.  Lung Cancer Screening: (Low Dose CT Chest recommended if Age 69-80 years, 30 pack-year currently smoking OR have quit w/in 15years.) does not qualify.   Lung Cancer Screening Referral: no  Additional Screening:  Hepatitis C Screening: does qualify; Completed yes  Vision Screening: Recommended annual ophthalmology exams  for early detection of glaucoma and other disorders of the eye. Is the patient up to date with their annual eye exam?  Yes  Who is the provider or what is the name of the office in which the patient attends annual eye exams? Teodoro Spray, OD. If pt is not established with a provider, would they like to be referred to a provider to establish care?  No .   Dental Screening: Recommended annual dental exams for proper oral hygiene  Community Resource Referral / Chronic Care Management: CRR required this visit?  No   CCM required this visit?  No      Plan:     I have personally reviewed and noted the following in the patient's chart:   . Medical and social history . Use of alcohol, tobacco or illicit drugs  . Current medications and supplements . Functional ability and status . Nutritional status . Physical activity . Advanced directives . List of other physicians . Hospitalizations, surgeries, and ER visits in previous 12 months . Vitals . Screenings to include cognitive, depression, and falls . Referrals and appointments  In addition, I have reviewed and discussed with patient certain preventive protocols, quality metrics, and best practice recommendations. A written personalized care plan for preventive services as well as general preventive health recommendations were provided to patient.     Sheral Flow, LPN   08/23/1605   Nurse Notes: n/a   Medical screening examination/treatment/procedure(s) were performed by non-physician practitioner and as supervising provider I was immediately available for consultation/collaboration.  I agree with above. Marrian Salvage, FNP

## 2020-03-27 ENCOUNTER — Encounter: Payer: Self-pay | Admitting: Family

## 2020-03-27 ENCOUNTER — Telehealth: Payer: Self-pay | Admitting: Family

## 2020-03-27 MED ORDER — PANTOPRAZOLE SODIUM 40 MG PO TBEC: 40.0000 mg | DELAYED_RELEASE_TABLET | Freq: Every day | ORAL | 0 refills | Status: DC

## 2020-03-27 MED ORDER — DICYCLOMINE HCL 20 MG PO TABS: 20.0000 mg | ORAL_TABLET | Freq: Four times a day (QID) | ORAL | 0 refills | Status: DC | PRN

## 2020-03-27 NOTE — Telephone Encounter (Signed)
-----   Message from Sheral Flow, [place] sent at 02/24/4942 11:40 AM EST ----- Regarding: Refills Patient would like to know if you could take over the refills for Bentyl and Protonix.  Patient would also like to know if you still want her to have only 1 year appointments for refills or to address any concerns every 6 months?  Courtney [name]

## 2020-03-29 ENCOUNTER — Other Ambulatory Visit: Payer: Self-pay | Admitting: Obstetrics and Gynecology

## 2020-03-29 DIAGNOSIS — Z1231 Encounter for screening mammogram for malignant neoplasm of breast: Secondary | ICD-10-CM

## 2020-03-30 ENCOUNTER — Ambulatory Visit (INDEPENDENT_AMBULATORY_CARE_PROVIDER_SITE_OTHER): Payer: Medicare HMO | Admitting: Physician Assistant

## 2020-03-30 ENCOUNTER — Encounter (INDEPENDENT_AMBULATORY_CARE_PROVIDER_SITE_OTHER): Payer: Self-pay | Admitting: Physician Assistant

## 2020-03-30 ENCOUNTER — Other Ambulatory Visit: Payer: Self-pay

## 2020-03-30 VITALS — BP 113/67 | HR 50 | Temp 97.6°F | Ht 60.0 in | Wt 152.0 lb

## 2020-03-30 DIAGNOSIS — E782 Mixed hyperlipidemia: Secondary | ICD-10-CM | POA: Diagnosis not present

## 2020-03-30 DIAGNOSIS — Z6829 Body mass index (BMI) 29.0-29.9, adult: Secondary | ICD-10-CM

## 2020-03-30 DIAGNOSIS — R7303 Prediabetes: Secondary | ICD-10-CM

## 2020-03-30 DIAGNOSIS — E559 Vitamin D deficiency, unspecified: Secondary | ICD-10-CM | POA: Diagnosis not present

## 2020-03-30 DIAGNOSIS — E669 Obesity, unspecified: Secondary | ICD-10-CM

## 2020-03-30 DIAGNOSIS — F3289 Other specified depressive episodes: Secondary | ICD-10-CM | POA: Diagnosis not present

## 2020-03-30 MED ORDER — BUPROPION HCL ER (SR) 200 MG PO TB12
200.0000 mg | ORAL_TABLET | Freq: Every day | ORAL | 0 refills | Status: DC
Start: 2020-03-30 — End: 2020-04-14

## 2020-03-30 MED ORDER — VITAMIN D (ERGOCALCIFEROL) 1.25 MG (50000 UNIT) PO CAPS: 50000.0000 [IU] | ORAL_CAPSULE | ORAL | 0 refills | Status: DC

## 2020-03-30 NOTE — Progress Notes (Signed)
Chief Complaint:   OBESITY Courtney [name] is here to discuss her progress with her obesity treatment plan along with follow-up of her obesity related diagnoses. Courtney [name] is on the Category 2 Plan and states she is following her eating plan approximately 80% of the time. Courtney [name] states she is doing 0 minutes 0 times per week.  Today's visit was #: 7 Starting weight: 157 lbs Starting date: 06/16/2017 Today's weight: 152 lbs Today's date: 03/30/2020 Total lbs lost to date: 5 Total lbs lost since last in-office visit: 0  Interim History: Courtney [name] reports that she has been very busy this month and she is eating out more often. Her breakfast and lunch tend to be consistent, and dinner is a challenge.  Subjective:   1. Vitamin D deficiency Courtney [name] is on Vit D weekly, and she denies nausea, vomiting, or muscle weakness.  2. Pre-diabetes Courtney [name] has no recent labs, and she denies polyphagia, but she is "thinking about food more".  3. Mixed hyperlipidemia Courtney [name] is on Zetia and Crestor, and she denies chest pain or myalgias.  4. Other depression Courtney [name] notes her Wellbutrin is not working well and she is thinking about food more.  Assessment/Plan:   1. Vitamin D deficiency Low Vitamin D level contributes to fatigue and are associated with obesity, breast, and colon cancer. We will check labs today, and we will refill prescription Vitamin D for 1 month. Tanzie will follow-up for routine testing of Vitamin D, at least 2-3 times per year to avoid over-replacement.  - VITAMIN D 25 Hydroxy (Vit-D Deficiency, Fractures) - Vitamin D, Ergocalciferol, (DRISDOL) 1.25 MG (50000 UNIT) CAPS capsule; Take 1 capsule (50,000 Units total) by mouth every 7 (seven) days.  Dispense: 4 capsule; Refill: 0  2. Pre-diabetes Courtney [name] will continue her meal plan, and will continue to work on weight loss, exercise, and decreasing simple carbohydrates to help decrease the risk of diabetes. We will check labs today.  - Comprehensive  metabolic panel - Hemoglobin A1c - Insulin, random  3. Mixed hyperlipidemia Cardiovascular risk and specific lipid/LDL goals reviewed. We discussed several lifestyle modifications today. Courtney [name] will continue her meal plan, and will continue to work on exercise and weight loss efforts. We will check labs today. Orders and follow up as documented in patient record.   Counseling Intensive lifestyle modifications are the first line treatment for this issue. . Dietary changes: Increase soluble fiber. Decrease simple carbohydrates. . Exercise changes: Moderate to vigorous-intensity aerobic activity 150 minutes per week if tolerated. . Lipid-lowering medications: see documented in medical record.  - Lipid panel  4. Other depression Behavior modification techniques were discussed today to help Courtney [name] deal with her emotional/non-hunger eating behaviors. Courtney [name] agreed to increase Wellbutrin SR to 200 mg q AM #30 with no refills. Orders and follow up as documented in patient record.   5. Class 1 obesity with serious comorbidity and body mass index (BMI) of 30.0 to 30.9 in adult, unspecified obesity type Courtney [name] is currently in the action stage of change. As such, her goal is to continue with weight loss efforts. She has agreed to the Category 1 Plan and keeping a food journal and adhering to recommended goals of 350-450 calories and 35 grams of protein at supper daily.   Exercise goals: No exercise has been prescribed at this time.  Behavioral modification strategies: meal planning and cooking strategies and keeping healthy foods in the home.  Courtney [name] has agreed to follow-up with our clinic in 4 weeks. She was informed of  the importance of frequent follow-up visits to maximize her success with intensive lifestyle modifications for her multiple health conditions.   Courtney [name] was informed we would discuss her lab results at her next visit unless there is a critical issue that needs to be addressed sooner. Courtney [name]  agreed to keep her next visit at the agreed upon time to discuss these results.  Objective:   Blood pressure 113/67, pulse (!) 50, temperature 97.6 F (36.4 C), height 5' (1.524 m), weight 152 lb (68.9 kg), SpO2 99 %. Body mass index is 29.69 kg/m.  General: Cooperative, alert, well developed, in no acute distress. HEENT: Conjunctivae and lids unremarkable. Cardiovascular: Regular rhythm.  Lungs: Normal work of breathing. Neurologic: No focal deficits.   Lab Results  Component Value Date   CREATININE 0.84 12/01/2019   BUN 20 12/01/2019   NA 142 12/01/2019   K 4.6 12/01/2019   CL 105 12/01/2019   CO2 24 12/01/2019   Lab Results  Component Value Date   ALT 32 12/01/2019   AST 30 12/01/2019   ALKPHOS 66 12/01/2019   BILITOT 0.4 12/01/2019   Lab Results  Component Value Date   HGBA1C 5.6 12/01/2019   HGBA1C 5.7 (H) 08/04/2019   HGBA1C 5.4 09/14/2018   HGBA1C 5.6 03/03/2018   HGBA1C 5.6 10/15/2017   Lab Results  Component Value Date   INSULIN 7.2 12/01/2019   INSULIN 4.9 08/04/2019   INSULIN 4.2 09/14/2018   INSULIN 5.0 03/03/2018   INSULIN 5.9 10/15/2017   Lab Results  Component Value Date   TSH 1.75 01/03/2020   Lab Results  Component Value Date   CHOL 165 08/04/2019   HDL 66 08/04/2019   LDLCALC 80 08/04/2019   TRIG 109 08/04/2019   CHOLHDL 2.5 10/29/2016   Lab Results  Component Value Date   WBC 7.3 01/03/2020   HGB 13.0 01/03/2020   HCT 37.7 01/03/2020   MCV 91.5 01/03/2020   PLT 196.0 01/03/2020   Lab Results  Component Value Date   IRON 114 01/03/2020   TIBC 328 01/25/2019   FERRITIN 54 01/25/2019    Obesity Behavioral Intervention:   Approximately 15 minutes were spent on the discussion below.  ASK: We discussed the diagnosis of obesity with Courtney [name] today and Courtney [name] agreed to give [place] permission to discuss obesity behavioral modification therapy today.  ASSESS: Courtney [name] has the diagnosis of obesity and her BMI today is 26.69. Courtney [name] is in  the action stage of change.   ADVISE: Courtney [name] was educated on the multiple health risks of obesity as well as the benefit of weight loss to improve her health. She was advised of the need for long term treatment and the importance of lifestyle modifications to improve her current health and to decrease her risk of future health problems.  AGREE: Multiple dietary modification options and treatment options were discussed and Cherlyn agreed to follow the recommendations documented in the above note.  ARRANGE: Courtney [name] was educated on the importance of frequent visits to treat obesity as outlined per CMS and USPSTF guidelines and agreed to schedule her next follow up appointment today.  Attestation Statements:   Reviewed by clinician on day of visit: allergies, medications, problem list, medical history, surgical history, family history, social history, and previous encounter notes.   Wilhemena Durie, am acting as transcriptionist for [employer], PA-C.  I have reviewed the above documentation for accuracy and completeness, and I agree with the above. Abby Potash, PA-C

## 2020-03-31 ENCOUNTER — Other Ambulatory Visit (INDEPENDENT_AMBULATORY_CARE_PROVIDER_SITE_OTHER): Payer: Self-pay | Admitting: Family Medicine

## 2020-03-31 ENCOUNTER — Encounter (INDEPENDENT_AMBULATORY_CARE_PROVIDER_SITE_OTHER): Payer: Self-pay | Admitting: Physician Assistant

## 2020-03-31 DIAGNOSIS — Z0100 Encounter for examination of eyes and vision without abnormal findings: Secondary | ICD-10-CM | POA: Diagnosis not present

## 2020-03-31 DIAGNOSIS — E559 Vitamin D deficiency, unspecified: Secondary | ICD-10-CM

## 2020-03-31 LAB — LIPID PANEL
Chol/HDL Ratio: 2.6 ratio (ref 0.0–4.4)
Cholesterol, Total: 173 mg/dL (ref 100–199)
HDL: 67 mg/dL (ref >39)
LDL Chol Calc (NIH): 91 mg/dL (ref 0–99)
Triglycerides: 84 mg/dL (ref 0–149)
VLDL Cholesterol Cal: 15 mg/dL (ref 5–40)

## 2020-03-31 LAB — COMPREHENSIVE METABOLIC PANEL
ALT: 19 IU/L (ref 0–32)
AST: 22 IU/L (ref 0–40)
Albumin/Globulin Ratio: 2.1 (ref 1.2–2.2)
Albumin: 4.4 g/dL (ref 3.7–4.7)
Alkaline Phosphatase: 66 IU/L (ref 44–121)
BUN/Creatinine Ratio: 25 (ref 12–28)
BUN: 26 mg/dL (ref 8–27)
Bilirubin Total: 0.2 mg/dL (ref 0.0–1.2)
CO2: 22 mmol/L (ref 20–29)
Calcium: 9.4 mg/dL (ref 8.7–10.3)
Chloride: 105 mmol/L (ref 96–106)
Creatinine, Ser: 1.03 mg/dL — ABNORMAL HIGH (ref 0.57–1.00)
GFR calc Af Amer: 63 mL/min/{1.73_m2} (ref >59)
GFR calc non Af Amer: 55 mL/min/{1.73_m2} — ABNORMAL LOW (ref >59)
Globulin, Total: 2.1 g/dL (ref 1.5–4.5)
Glucose: 94 mg/dL (ref 65–99)
Potassium: 4.8 mmol/L (ref 3.5–5.2)
Sodium: 140 mmol/L (ref 134–144)
Total Protein: 6.5 g/dL (ref 6.0–8.5)

## 2020-03-31 LAB — HEMOGLOBIN A1C
Est. average glucose Bld gHb Est-mCnc: 120 mg/dL
Hgb A1c MFr Bld: 5.8 % — ABNORMAL HIGH (ref 4.8–5.6)

## 2020-03-31 LAB — INSULIN, RANDOM: INSULIN: 9.4 u[IU]/mL (ref 2.6–24.9)

## 2020-03-31 LAB — VITAMIN D 25 HYDROXY (VIT D DEFICIENCY, FRACTURES): Vit D, 25-Hydroxy: 62.7 ng/mL (ref 30.0–100.0)

## 2020-04-03 NOTE — Telephone Encounter (Signed)
Last OV with Tracey 

## 2020-04-06 NOTE — Progress Notes (Signed)
[facility] [place] Phone: [phone] Subjective:   Fontaine No, am serving as a scribe for Dr. Hulan Saas. This visit occurred during the SARS-CoV-2 public health emergency.  Safety protocols were in place, including screening questions prior to the visit, additional usage of staff PPE, and extensive cleaning of exam room while observing appropriate contact time as indicated for disinfecting solutions.   I'm seeing this patient by the request  of:  Marrian Salvage, FNP  CC: Left foot pain  [id number]   02/02/2020 History of this and did have a replacement.  Having some mild increase in groin pain.  We will get x-rays to make sure there is no instability or loosening.  Follow-up with me again though as needed with patient going into the holiday season.  Known mild patellofemoral arthritis.  Would like the x-rays look to further confirm.  No instability and will monitor.  Patient does have degenerative disc disease.  Cervical.  Doing much better at this time and is nearly 100%.  The patient will continue with the exercises.  Gabapentin did help her with sleep initially but no longer needs.  We discussed she could try 300 mg and if so we can increase the dosing.  Patient was doing so well I do feel she can follow-up as needed  Update 04/07/2020 Courtney [name] is a 70 y.o. female coming in with complaint of left foot pain. Patient states she got out of car 2 weeks ago and developed pain in top of foot. Pain occurs with weight bearing. History of bursitis in foot diagnosed by [facility] in 2019. Patient has been using Voltaren and KT tape. Does have orthotics that were made in 2019. Patient is going to [place] in 3 weeks and would like to try to reduce pain prior to her trip.      Past Medical History:  Diagnosis Date  . Aortic valve sclerosis    echo 93/7169, soft systolic murmur  . Arthritis    in  right hip  . Back pain   . Carotid artery disease ([place])    40-59% bilateral, doppler December 2010  . Cataract   . Constipation   . Coronary artery disease    a. BMS-mid RCA 11/2007 b. stress echo 02/2009 subtle inferior HK, lateral ST depressions with stress c. follow-up cath 02/2009: RCA stent patent, severe but stable stenosis of distal posterior lateral branch RCA. LV normal, med tx unless more sx c. ETT Myoview 08/27/12: negative for ischemia; EF 64%  . Decreased hearing   . Depression    Mild, August, 2012  . Dyslipidemia   . Easy bruising   . Ejection fraction    EF normal, catheterization, 2011 /  EF 55%, echo, 2010  . Fluid overload    Mild, stable since hospitalization in the past  . GERD (gastroesophageal reflux disease)    occarionally, takes Pepcid over the counter  . Heart murmur    has, occasional PVC's  . History of right hip replacement   . Hyperlipidemia   . Hypertension   . Joint pain   . Myocardial infarction ([place])    2009  . Palpitations   . PVC (premature ventricular contraction)    per prior Holter monitor  . Right hip pain 12/2009  . Shellfish allergy   . Trouble in sleeping   . Vitamin B12 deficiency   . Vitamin D deficiency  Past Surgical History:  Procedure Laterality Date  . 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/01/2005  . BREAST BIOPSY Left   . carcinoid tumor removal     colon-  . CARDIAC CATHETERIZATION  1/211   Patent RCA stent, stenotic PLB supplying small distribution; medically managed  . CATARACT EXTRACTION, BILATERAL    . CORONARY ANGIOPLASTY WITH STENT PLACEMENT  11/2007   BMS-mid RCA  . DIAGNOSTIC LAPAROSCOPY     1982 for endometriosis  . DILATION AND CURETTAGE OF UTERUS  2005  . SKIN CANCER EXCISION    . TOTAL HIP ARTHROPLASTY Right 07/08/2014   Procedure: RIGHT TOTAL HIP ARTHROPLASTY ANTERIOR APPROACH;  Surgeon: Mcarthur Rossetti, MD;  Location: [facility];  Service: Orthopedics;  Laterality: Right;  . WISDOM TOOTH EXTRACTION     Social  History   Socioeconomic History  . Marital status: Divorced    Spouse name: Not on file  . Number of children: 1  . Years of education: 87  . Highest education level: Not on file  Occupational History  . Occupation: Retired - Retail buyer  Tobacco Use  . Smoking status: Never Smoker  . Smokeless tobacco: Never Used  Vaping Use  . Vaping Use: Never used  Substance and Sexual Activity  . Alcohol use: Yes    Alcohol/week: 7.0 standard drinks    Types: 7 Glasses of wine per week    Comment: 1 glass of wine/night  . Drug use: No  . Sexual activity: Yes  Other Topics Concern  . Not on file  Social History Narrative   Fun: Dance, garden,    Denies religious beliefs effecting health care.   Denies abuse and feels safe at home   Social Determinants of Health   Financial Resource Strain: Low Risk   . Difficulty of Paying Living Expenses: Not hard at all  Food Insecurity: No Food Insecurity  . Worried About Charity fundraiser in the Last Year: Never true  . Ran Out of Food in the Last Year: Never true  Transportation Needs: No Transportation Needs  . Lack of Transportation (Medical): No  . Lack of Transportation (Non-Medical): No  Physical Activity: Sufficiently Active  . Days of Exercise per Week: 5 days  . Minutes of Exercise per Session: 30 min  Stress: No Stress Concern Present  . Feeling of Stress : Not at all  Social Connections: Moderately Isolated  . Frequency of Communication with Friends and Family: More than three times a week  . Frequency of Social Gatherings with Friends and Family: More than three times a week  . Attends Religious Services: Never  . Active Member of Clubs or Organizations: No  . Attends Archivist Meetings: Never  . Marital Status: Living with partner   Allergies  Allergen Reactions  . Erythromycin Hives  . Penicillins Hives    Has patient had a PCN reaction causing immediate rash, facial/tongue/throat swelling, SOB or  lightheadedness with hypotension: No Has patient had a PCN reaction causing severe rash involving mucus membranes or skin necrosis: No Has patient had a PCN reaction that required hospitalization: No Has patient had a PCN reaction occurring within the last 10 years: No If all of the above answers are "NO", then may proceed with Cephalosporin use.   . Shellfish Allergy Hives   Family History  Problem Relation Age of Onset  . Heart attack Father 69  . Hyperlipidemia Father   . Hypertension Father   . Sudden death Father   . Stroke  Mother   . Hyperlipidemia Mother   . Hypertension Mother   . Heart disease Mother   . Obesity Mother   . Heart attack Paternal Grandfather   . Heart disease Sister   . BRCA 1/2 Neg Hx   . Breast cancer Neg Hx   . Esophageal cancer Neg Hx   . Colon cancer Neg Hx   . Pancreatic cancer Neg Hx   . Liver disease Neg Hx   . Stomach cancer Neg Hx     Current Outpatient Medications (Endocrine & Metabolic):  .  predniSONE (DELTASONE) 20 MG tablet, Take 1 tablet (20 mg total) by mouth 2 (two) times daily.  Current Outpatient Medications (Cardiovascular):  .  ezetimibe (ZETIA) 10 MG tablet, TAKE 1 TABLET (10 MG TOTAL) BY MOUTH DAILY. .  metoprolol tartrate (LOPRESSOR) 25 MG tablet, Take 12.5 mg by mouth 2 (two) times daily. .  nitroGLYCERIN (NITROSTAT) 0.4 MG SL tablet, Place 1 tablet (0.4 mg total) under the tongue every 5 (five) minutes as needed for chest pain. .  ramipril (ALTACE) 10 MG capsule, Take 1 capsule (10 mg total) by mouth daily. .  rosuvastatin (CRESTOR) 40 MG tablet, Take 1 tablet (40 mg total) by mouth daily.  Current Outpatient Medications (Respiratory):  .  fluticasone (FLONASE) 50 MCG/ACT nasal spray, Place 2 sprays into both nostrils daily for 14 days.  Current Outpatient Medications (Analgesics):  .  acetaminophen (TYLENOL) 500 MG tablet, Take 1,000 mg by mouth every 6 (six) hours as needed (Pain). [name]  aspirin 81 MG tablet, Take 81 mg by  mouth daily.   Current Outpatient Medications (Other):  [name]  buPROPion (WELLBUTRIN SR) 200 MG 12 hr tablet, Take 1 tablet (200 mg total) by mouth daily. [name]  dicyclomine (BENTYL) 20 MG tablet, Take 1 tablet (20 mg total) by mouth every 6 (six) hours as needed for spasms. [name]  gabapentin (NEURONTIN) 100 MG capsule, Take 2 capsules (200 mg total) by mouth at bedtime. .  gabapentin (NEURONTIN) 300 MG capsule, Take 1 capsule (300 mg total) by mouth at bedtime. .  pantoprazole (PROTONIX) 40 MG tablet, Take 1 tablet (40 mg total) by mouth daily before breakfast. .  Passion Flower-Valerian 500-500 MG CAPS, Take 1 capsule by mouth daily. .  Vitamin D, Ergocalciferol, (DRISDOL) 1.25 MG (50000 UNIT) CAPS capsule, Take 1 capsule (50,000 Units total) by mouth every 7 (seven) days.   Reviewed prior external information including notes and imaging from  primary care provider As well as notes that were available from care everywhere and other healthcare systems.  Past medical history, social, surgical and family history all reviewed in electronic medical record.  No pertanent information unless stated regarding to the chief complaint.   Review of Systems:  No headache, visual changes, nausea, vomiting, diarrhea, constipation, dizziness, abdominal pain, skin rash, fevers, chills, night sweats, weight loss, swollen lymph nodes, body aches, joint swelling, chest pain, shortness of breath, mood changes. POSITIVE muscle aches  Objective  Blood pressure 128/82, pulse (!) 58, height 5' (1.524 m), weight 155 lb (70.3 kg), SpO2 99 %.   General: No apparent distress alert and oriented x3 mood and affect normal, dressed appropriately.  HEENT: Pupils equal, extraocular movements intact  Respiratory: Patient's speak in full sentences and does not appear short of breath  Cardiovascular: No lower extremity edema, non tender, no erythema  Gait antalgic MSK:   Left foot exam was no gross deformity.  Does have a mild rigid  midfoot.  Tenderness to  over the second and third metatarsals on the dorsal aspect.  Patient has some mild pain over the cuboid as well.  This is on the plantar aspect of the foot.  No significant swelling noted.  Neurovascularly intact distally.  Negative squeeze test.  Good capillary refill.  Limited musculoskeletal ultrasound was performed and interpreted by Lyndal Pulley  Limited ultrasound of patient's left foot on the dorsal aspect shows that patient may have a stress reaction noted of the second and third metatarsals.  Increasing in Doppler flow and mild hypoechoic changes.  No true cortical defects are noted.  Patient does have some mild to moderate narrowing of the midfoot joints. Impression: Mild arthritis but likely stress reaction of the second and third metatarsals    Impression and Recommendations:     The above documentation has been reviewed and is accurate and complete Lyndal Pulley, DO

## 2020-04-07 ENCOUNTER — Ambulatory Visit: Payer: Self-pay

## 2020-04-07 ENCOUNTER — Ambulatory Visit (INDEPENDENT_AMBULATORY_CARE_PROVIDER_SITE_OTHER): Payer: Medicare HMO | Admitting: Family Medicine

## 2020-04-07 ENCOUNTER — Ambulatory Visit (INDEPENDENT_AMBULATORY_CARE_PROVIDER_SITE_OTHER): Payer: Medicare HMO

## 2020-04-07 ENCOUNTER — Other Ambulatory Visit: Payer: Self-pay

## 2020-04-07 ENCOUNTER — Encounter: Payer: Self-pay | Admitting: Family Medicine

## 2020-04-07 VITALS — BP 128/82 | HR 58 | Ht 60.0 in | Wt 155.0 lb

## 2020-04-07 DIAGNOSIS — M79672 Pain in left foot: Secondary | ICD-10-CM

## 2020-04-07 DIAGNOSIS — M84375A Stress fracture, left foot, initial encounter for fracture: Secondary | ICD-10-CM | POA: Insufficient documentation

## 2020-04-07 DIAGNOSIS — M7732 Calcaneal spur, left foot: Secondary | ICD-10-CM | POA: Diagnosis not present

## 2020-04-07 MED ORDER — PREDNISONE 20 MG PO TABS: 20.0000 mg | ORAL_TABLET | Freq: Two times a day (BID) | ORAL | 0 refills | Status: DC

## 2020-04-07 MED ORDER — GABAPENTIN 300 MG PO CAPS
300.0000 mg | ORAL_CAPSULE | Freq: Every day | ORAL | 0 refills | Status: DC
Start: 2020-04-07 — End: 2020-05-15

## 2020-04-07 NOTE — Assessment & Plan Note (Signed)
Patient has what appears to be more of a stress reaction or stress fracture of the foot.  Seems to be in the midfoot area.  No true cortical irregularity noted but increasing Doppler flow to the cortex.  Patient also had what appeared to be a drop cuboid injury did help back.  CAM Walker given today, continue once weekly vitamin D and discussed K2 short-term.  Patient will only do it up to her trip and then discontinue it.  Increase gabapentin to 300 mg to help with sleep which patient has been doing already at this time.  Patient will follow up with me again 4 to 6 weeks for further evaluation and treatment.

## 2020-04-07 NOTE — Patient Instructions (Addendum)
Xray today Stress fracture and dropped cuiboid K2 100mg  daily over the counter Gabapentin 300mg  tabs Keep doing once weekly Vit D Cam walker  See me in 4-6 weeks

## 2020-04-08 ENCOUNTER — Encounter: Payer: Self-pay | Admitting: Family Medicine

## 2020-04-14 ENCOUNTER — Ambulatory Visit (INDEPENDENT_AMBULATORY_CARE_PROVIDER_SITE_OTHER): Payer: Medicare HMO | Admitting: Family

## 2020-04-14 ENCOUNTER — Encounter: Payer: Self-pay | Admitting: Family

## 2020-04-14 ENCOUNTER — Other Ambulatory Visit: Payer: Self-pay

## 2020-04-14 VITALS — BP 120/70 | HR 48 | Temp 97.7°F | Ht 60.0 in | Wt 155.0 lb

## 2020-04-14 DIAGNOSIS — Z0000 Encounter for general adult medical examination without abnormal findings: Secondary | ICD-10-CM | POA: Diagnosis not present

## 2020-04-14 DIAGNOSIS — M1990 Unspecified osteoarthritis, unspecified site: Secondary | ICD-10-CM

## 2020-04-14 MED ORDER — PANTOPRAZOLE SODIUM 40 MG PO TBEC: 40.0000 mg | DELAYED_RELEASE_TABLET | Freq: Every day | ORAL | 3 refills | Status: DC

## 2020-04-14 NOTE — Progress Notes (Signed)
Courtney [name] is a 70 y.o. female with the following history as recorded in EpicCare:  Patient Active Problem List   Diagnosis Date Noted  . Metatarsal stress fracture of left foot 04/07/2020  . Degenerative disc disease, cervical 02/02/2020  . Polyarthralgia 01/03/2020  . Patellofemoral arthritis of right knee 06/16/2019  . Vitamin D deficiency, Vit D = 43.2 (01/25/19), Rx vit D 50K IU every 2 weeks 08/04/2018  . Cyst of right breast 05/07/2018  . Chronic headache disorder 02/08/2018  . Insomnia 07/15/2017  . Female stress incontinence 07/15/2017  . Gastroesophageal reflux disease with esophagitis, Rx Protonix 02/25/2017  . Osteopenia 06/27/2015  . Osteoarthritis of right hip 07/08/2014  . Carotid artery disease ([place])   . Aortic valve sclerosis   . Mixed hyperlipidemia, Rx Crestor and Zetia   . Coronary artery disease     Current Outpatient Medications  Medication Sig Dispense Refill  . acetaminophen (TYLENOL) 500 MG tablet Take 1,000 mg by mouth every 6 (six) hours as needed (Pain).    [name] aspirin 81 MG tablet Take 81 mg by mouth daily.    [name] dicyclomine (BENTYL) 20 MG tablet Take 1 tablet (20 mg total) by mouth every 6 (six) hours as needed for spasms. 180 tablet 0  . ezetimibe (ZETIA) 10 MG tablet TAKE 1 TABLET (10 MG TOTAL) BY MOUTH DAILY. 90 tablet 3  . gabapentin (NEURONTIN) 300 MG capsule Take 1 capsule (300 mg total) by mouth at bedtime. 90 capsule 0  . metoprolol tartrate (LOPRESSOR) 25 MG tablet Take 12.5 mg by mouth 2 (two) times daily.    . nitroGLYCERIN (NITROSTAT) 0.4 MG SL tablet Place 1 tablet (0.4 mg total) under the tongue every 5 (five) minutes as needed for chest pain. 25 tablet 11  . Passion Flower-Valerian 500-500 MG CAPS Take 1 capsule by mouth daily.    . predniSONE (DELTASONE) 20 MG tablet Take 1 tablet (20 mg total) by mouth 2 (two) times daily. 10 tablet 0  . ramipril (ALTACE) 10 MG capsule Take 1 capsule (10 mg total) by mouth daily. 90 capsule 3  .  rosuvastatin (CRESTOR) 40 MG tablet Take 1 tablet (40 mg total) by mouth daily. 90 tablet 3  . Vitamin D, Ergocalciferol, (DRISDOL) 1.25 MG (50000 UNIT) CAPS capsule Take 1 capsule (50,000 Units total) by mouth every 7 (seven) days. 4 capsule 0  . pantoprazole (PROTONIX) 40 MG tablet Take 1 tablet (40 mg total) by mouth daily. 90 tablet 3   No current facility-administered medications for this visit.    Allergies: Erythromycin, Penicillins, and Shellfish allergy  Past Medical History:  Diagnosis Date  . Aortic valve sclerosis    echo 97/0263, soft systolic murmur  . 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 Back pain   . Carotid artery disease ([place])    40-59% bilateral, doppler December 2010  . Cataract   . Constipation   . Coronary artery disease    a. BMS-mid RCA 11/2007 b. stress echo 02/2009 subtle inferior HK, lateral ST depressions with stress c. follow-up cath 02/2009: RCA stent patent, severe but stable stenosis of distal posterior lateral branch RCA. LV normal, med tx unless more sx c. ETT Myoview 08/27/12: negative for ischemia; EF 64%  . Decreased hearing   . Depression    Mild, August, 2012  . Dyslipidemia   . Easy bruising   . Ejection fraction    EF normal, catheterization, 2011 /  EF 55%, echo, 2010  . Fluid  overload    Mild, stable since hospitalization in the past  . GERD (gastroesophageal reflux disease)    occarionally, takes Pepcid over the counter  . Heart murmur    has, occasional PVC's  . History of right hip replacement   . Hyperlipidemia   . Hypertension   . Joint pain   . Myocardial infarction ([place])    2009  . Palpitations   . PVC (premature ventricular contraction)    per prior Holter monitor  . Right hip pain 12/2009  . Shellfish allergy   . Trouble in sleeping   . Vitamin B12 deficiency   . Vitamin D deficiency     Past Surgical History:  Procedure Laterality Date  . 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/01/2005  . BREAST BIOPSY Left   . carcinoid tumor removal      colon-  . CARDIAC CATHETERIZATION  1/211   Patent RCA stent, stenotic PLB supplying small distribution; medically managed  . CATARACT EXTRACTION, BILATERAL    . CORONARY ANGIOPLASTY WITH STENT PLACEMENT  11/2007   BMS-mid RCA  . DIAGNOSTIC LAPAROSCOPY     1982 for endometriosis  . DILATION AND CURETTAGE OF UTERUS  2005  . SKIN CANCER EXCISION    . TOTAL HIP ARTHROPLASTY Right 07/08/2014   Procedure: RIGHT TOTAL HIP ARTHROPLASTY ANTERIOR APPROACH;  Surgeon: Mcarthur Rossetti, MD;  Location: [facility];  Service: Orthopedics;  Laterality: Right;  . WISDOM TOOTH EXTRACTION      Family History  Problem Relation Age of Onset  . Heart attack Father 57  . Hyperlipidemia Father   . Hypertension Father   . Sudden death Father   . Stroke Mother   . Hyperlipidemia Mother   . Hypertension Mother   . Heart disease Mother   . Obesity Mother   . Heart attack Paternal Grandfather   . Heart disease Sister   . BRCA 1/2 Neg Hx   . Breast cancer Neg Hx   . Esophageal cancer Neg Hx   . Colon cancer Neg Hx   . Pancreatic cancer Neg Hx   . Liver disease Neg Hx   . Stomach cancer Neg Hx     Social History   Tobacco Use  . Smoking status: Never Smoker  . Smokeless tobacco: Never Used  Substance Use Topics  . Alcohol use: Yes    Alcohol/week: 7.0 standard drinks    Types: 7 Glasses of wine per week    Comment: 1 glass of wine/night    Subjective:  Presents for yearly CPE; majority of care is managed by specialists; No acute concerns today; Sees cardiology, dermatology regularly; sees GYN regularly; Having increased concerns regarding arthritis- having to take Ibuprofen 600 mg daily;   Review of Systems  Constitutional: Negative.   HENT: Negative.   Eyes: Negative.   Respiratory: Negative.   Cardiovascular: Negative.   Gastrointestinal: Negative.   Genitourinary: Negative.   Musculoskeletal: Positive for joint pain.  Skin: Negative.   Neurological: Negative.   Endo/Heme/Allergies:  Negative.   Psychiatric/Behavioral: Negative.       Objective:  Vitals:   04/14/20 0907  BP: 120/70  Pulse: (!) 48  Temp: 97.7 F (36.5 C)  TempSrc: Oral  SpO2: 95%  Weight: 155 lb (70.3 kg)  Height: 5' (1.524 m)    General: Well developed, well nourished, in no acute distress  Skin : Warm and dry.  Head: Normocephalic and atraumatic  Eyes: Sclera and conjunctiva clear; pupils round and reactive to light; extraocular movements intact  Ears: External normal; canals clear; tympanic membranes normal  Oropharynx: Pink, supple. No suspicious lesions  Neck: Supple without thyromegaly, adenopathy  Lungs: Respirations unlabored; clear to auscultation bilaterally without wheeze, rales, rhonchi  CVS exam: normal rate and regular rhythm. Murmur noted; Abdomen: Soft; nontender; nondistended; normoactive bowel sounds; no masses or hepatosplenomegaly  Musculoskeletal: No deformities; no active joint inflammation  Extremities: No edema, cyanosis, clubbing  Vessels: Symmetric bilaterally  Neurologic: Alert and oriented; speech intact; face symmetrical; moves all extremities well; CNII-XII intact without focal deficit  Assessment:  1. PE (physical exam), annual   2. Arthritis     Plan:  Age appropriate preventive healthcare needs addressed; encouraged regular eye doctor and dental exams; encouraged regular exercise; will update labs and refills as needed today; follow-up to be determined; Will plan for Pneumovax 23 in the next year- wants to wait until she gets back from upcoming trip before considering any immunizations.  This visit occurred during the SARS-CoV-2 public health emergency.  Safety protocols were in place, including screening questions prior to the visit, additional usage of staff PPE, and extensive cleaning of exam room while observing appropriate contact time as indicated for disinfecting solutions.     No follow-ups on file.  Orders Placed This Encounter  Procedures  .  Ambulatory referral to Rheumatology    Referral Priority:   Routine    Referral Type:   Consultation    Referral Reason:   Specialty Services Required    Requested Specialty:   Rheumatology    Number of Visits Requested:   1    Requested Prescriptions   Signed Prescriptions Disp Refills  . pantoprazole (PROTONIX) 40 MG tablet 90 tablet 3    Sig: Take 1 tablet (40 mg total) by mouth daily.

## 2020-04-24 ENCOUNTER — Ambulatory Visit (INDEPENDENT_AMBULATORY_CARE_PROVIDER_SITE_OTHER): Payer: Medicare HMO | Admitting: Family Medicine

## 2020-04-26 ENCOUNTER — Encounter (INDEPENDENT_AMBULATORY_CARE_PROVIDER_SITE_OTHER): Payer: Self-pay | Admitting: Physician Assistant

## 2020-04-26 ENCOUNTER — Ambulatory Visit (INDEPENDENT_AMBULATORY_CARE_PROVIDER_SITE_OTHER): Payer: Medicare HMO | Admitting: Physician Assistant

## 2020-04-26 ENCOUNTER — Other Ambulatory Visit: Payer: Self-pay

## 2020-04-26 ENCOUNTER — Other Ambulatory Visit (INDEPENDENT_AMBULATORY_CARE_PROVIDER_SITE_OTHER): Payer: Self-pay | Admitting: Physician Assistant

## 2020-04-26 VITALS — BP 98/58 | HR 52 | Temp 97.8°F | Ht 60.0 in | Wt 148.0 lb

## 2020-04-26 DIAGNOSIS — E559 Vitamin D deficiency, unspecified: Secondary | ICD-10-CM | POA: Diagnosis not present

## 2020-04-26 DIAGNOSIS — G4709 Other insomnia: Secondary | ICD-10-CM | POA: Diagnosis not present

## 2020-04-26 DIAGNOSIS — Z6830 Body mass index (BMI) 30.0-30.9, adult: Secondary | ICD-10-CM

## 2020-04-26 DIAGNOSIS — E663 Overweight: Secondary | ICD-10-CM

## 2020-04-26 DIAGNOSIS — E669 Obesity, unspecified: Secondary | ICD-10-CM | POA: Diagnosis not present

## 2020-04-26 MED ORDER — VITAMIN D (ERGOCALCIFEROL) 1.25 MG (50000 UNIT) PO CAPS: 50000.0000 [IU] | ORAL_CAPSULE | ORAL | 0 refills | Status: DC

## 2020-04-26 MED ORDER — TRAZODONE HCL 50 MG PO TABS: 25.0000 mg | ORAL_TABLET | Freq: Every day | ORAL | 0 refills | Status: DC

## 2020-04-27 ENCOUNTER — Encounter: Payer: Self-pay | Admitting: Family

## 2020-04-28 ENCOUNTER — Other Ambulatory Visit: Payer: Self-pay | Admitting: Family

## 2020-04-28 MED ORDER — MELOXICAM 15 MG PO TABS: 15.0000 mg | ORAL_TABLET | Freq: Every day | ORAL | 0 refills | Status: DC

## 2020-05-01 ENCOUNTER — Ambulatory Visit: Payer: Medicare HMO | Admitting: Dermatology

## 2020-05-02 NOTE — Progress Notes (Signed)
Chief Complaint:   OBESITY Yazleemar is here to discuss her progress with her obesity treatment plan along with follow-up of her obesity related diagnoses. Adlynn is on the Category 1 Plan and keeping a food journal and adhering to recommended goals of 350-450 calories and 35 grams of protein at supper daily and states she is following her eating plan approximately 90% of the time. Domini states she is doing 0 minutes 0 times per week.  Today's visit was #: 55 Starting weight: 157 lbs Starting date: 06/16/2017 Today's weight: 148 lbs Today's date: 04/26/2020 Total lbs lost to date: 9 Total lbs lost since last in-office visit: 4  Interim History: Journiee reports that she has been cooking more often and eating out less often. She concentrated on her protein and less snacking. She is going to see a rheumatologist due to worsening arthritis pain. She is traveling to [place] next week. She is asking about incorporating foods from the [employer].  Subjective:   1. Vitamin D deficiency Audie is on Vit D, and she denies nausea, vomiting, or muscle weakness. Last Vit D level was 62.7.  2. Other insomnia Khristian is not sleeping well due to pain. She uses trazodone periodically and it works well.  Assessment/Plan:   1. Vitamin D deficiency Low Vitamin D level contributes to fatigue and are associated with obesity, breast, and colon cancer. We will refill prescription Vitamin D for 1 month. [place]ani will follow-up for routine testing of Vitamin D, at least 2-3 times per year to avoid over-replacement.  - Vitamin D, Ergocalciferol, (DRISDOL) 1.25 MG (50000 UNIT) CAPS capsule; Take 1 capsule (50,000 Units total) by mouth every 7 (seven) days.  Dispense: 4 capsule; Refill: 0  2. Other insomnia The problem of recurrent insomnia was discussed. Orders and follow up as documented in patient record. Counseling: Intensive lifestyle modifications are the first line treatment for this issue. We discussed  several lifestyle modifications today. We will refill trazodone for 1 month. Joretta will continue to work on diet, exercise and weight loss efforts.   Counseling  Limit or avoid alcohol, caffeinated beverages, and cigarettes, especially close to bedtime.   Do not eat a large meal or eat spicy foods right before bedtime. This can lead to digestive discomfort that can make it hard for you to sleep.  Keep a sleep diary to help you and your health care provider figure out what could be causing your insomnia.  . Make your bedroom a dark, comfortable place where it is easy to fall asleep. ? Put up shades or blackout curtains to block light from outside. ? Use a white noise machine to block noise. ? Keep the temperature cool. . Limit screen use before bedtime. This includes: ? Watching TV. ? Using your smartphone, tablet, or computer. . Stick to a routine that includes going to bed and waking up at the same times every day and night. This can help you fall asleep faster. Consider making a quiet activity, such as reading, part of your nighttime routine. . Try to avoid taking naps during the day so that you sleep better at night. . Get out of bed if you are still awake after 15 minutes of trying to sleep. Keep the lights down, but try reading or doing a quiet activity. When you feel sleepy, go back to bed.  - traZODone (DESYREL) 50 MG tablet; Take 0.5-1 tablets (25-50 mg total) by mouth at bedtime.  Dispense: 30 tablet; Refill: 0  3. Class 1 obesity with serious comorbidity and body mass index (BMI) of 30.0 to 30.9 in adult, unspecified obesity type Hydeia is currently in the action stage of change. As such, her goal is to continue with weight loss efforts. She has agreed to change to keeping a food journal and adhering to recommended goals of 1000 calories and 70 grams of protein daily.   Exercise goals: No exercise has been prescribed at this time.  Behavioral modification strategies: meal planning  and cooking strategies and planning for success.  Caliah has agreed to follow-up with our clinic in 4 weeks. She was informed of the importance of frequent follow-up visits to maximize her success with intensive lifestyle modifications for her multiple health conditions.   Objective:   Blood pressure (!) 98/58, pulse (!) 52, temperature 97.8 F (36.6 C), height 5' (1.524 m), weight 148 lb (67.1 kg), SpO2 98 %. Body mass index is 28.9 kg/m.  General: Cooperative, alert, well developed, in no acute distress. HEENT: Conjunctivae and lids unremarkable. Cardiovascular: Regular rhythm.  Lungs: Normal work of breathing. Neurologic: No focal deficits.   Lab Results  Component Value Date   CREATININE 1.03 (H) 03/30/2020   BUN 26 03/30/2020   NA 140 03/30/2020   K 4.8 03/30/2020   CL 105 03/30/2020   CO2 22 03/30/2020   Lab Results  Component Value Date   ALT 19 03/30/2020   AST 22 03/30/2020   ALKPHOS 66 03/30/2020   BILITOT 0.2 03/30/2020   Lab Results  Component Value Date   HGBA1C 5.8 (H) 03/30/2020   HGBA1C 5.6 12/01/2019   HGBA1C 5.7 (H) 08/04/2019   HGBA1C 5.4 09/14/2018   HGBA1C 5.6 03/03/2018   Lab Results  Component Value Date   INSULIN 9.4 03/30/2020   INSULIN 7.2 12/01/2019   INSULIN 4.9 08/04/2019   INSULIN 4.2 09/14/2018   INSULIN 5.0 03/03/2018   Lab Results  Component Value Date   TSH 1.75 01/03/2020   Lab Results  Component Value Date   CHOL 173 03/30/2020   HDL 67 03/30/2020   LDLCALC 91 03/30/2020   TRIG 84 03/30/2020   CHOLHDL 2.6 03/30/2020   Lab Results  Component Value Date   WBC 7.3 01/03/2020   HGB 13.0 01/03/2020   HCT 37.7 01/03/2020   MCV 91.5 01/03/2020   PLT 196.0 01/03/2020   Lab Results  Component Value Date   IRON 114 01/03/2020   TIBC 328 01/25/2019   FERRITIN 54 01/25/2019    Obesity Behavioral Intervention:   Approximately 15 minutes were spent on the discussion below.  ASK: We discussed the diagnosis of obesity  with [name] today and Aileena agreed to give [place] permission to discuss obesity behavioral modification therapy today.  ASSESS: Shikha has the diagnosis of obesity and her BMI today is 28.9. Momina is in the action stage of change.   ADVISE: Tiyanna was educated on the multiple health risks of obesity as well as the benefit of weight loss to improve her health. She was advised of the need for long term treatment and the importance of lifestyle modifications to improve her current health and to decrease her risk of future health problems.  AGREE: Multiple dietary modification options and treatment options were discussed and Jenese agreed to follow the recommendations documented in the above note.  ARRANGE: Kavina was educated on the importance of frequent visits to treat obesity as outlined per CMS and USPSTF guidelines and agreed to schedule her next follow up appointment today.  Attestation Statements:   Reviewed by clinician on day of visit: allergies, medications, problem list, medical history, surgical history, family history, social history, and previous encounter notes.   Wilhemena Durie, am acting as transcriptionist for [employer], PA-C.  I have reviewed the above documentation for accuracy and completeness, and I agree with the above. Abby Potash, PA-C

## 2020-05-15 ENCOUNTER — Ambulatory Visit (INDEPENDENT_AMBULATORY_CARE_PROVIDER_SITE_OTHER): Payer: Medicare HMO | Admitting: Neurology

## 2020-05-15 ENCOUNTER — Encounter: Payer: Self-pay | Admitting: Neurology

## 2020-05-15 VITALS — BP 113/62 | HR 52 | Ht 60.0 in | Wt 155.5 lb

## 2020-05-15 DIAGNOSIS — G2581 Restless legs syndrome: Secondary | ICD-10-CM | POA: Insufficient documentation

## 2020-05-15 DIAGNOSIS — G4709 Other insomnia: Secondary | ICD-10-CM

## 2020-05-15 MED ORDER — GABAPENTIN 300 MG PO CAPS: 300.0000 mg | ORAL_CAPSULE | Freq: Every day | ORAL | 3 refills | Status: DC

## 2020-05-15 NOTE — Progress Notes (Signed)
[facility]  PATIENT: Courtney [name] DOB: 1950-12-13  REFERRING DOCTOR OR PCP:  [name], NP SOURCE: Patient, notes from emergency room and PCP, operatory results, imaging reports, CT scan from 10/14/2017 personally reviewed.  _________________________________   HISTORICAL  CHIEF COMPLAINT:  Chief Complaint  Patient presents with  . New Patient (Initial Visit)    Rm 13, alone. Internal referral from Victoria Beasley,MD for insomnia. Has had no prior sleep study. Has trouble falling asleep/wakes up a lot. This has been ongoing for years. Does not wake up with headaches. Does not snore.     HISTORY OF PRESENT ILLNESS:  Courtney [name] is a 70 year old woman I saw last year for HA (likely due to sphenoid sinusitis) which is resolved.   She is here today for insomnia.  She began to experience both sleep onset and sleep maintenance insomnia in 2018.   If she falls asleep easily (or if delayed), she will then wake up 2-3 times and have difficulty getting out of bed.   She has nocturia 0-1 times.    She has some RLS, fluctuating a lot and she will use a heating pad with benefit if worse.   She has had this x year.   When she can not sleep, she gets out of bed and does reading or a craft in dim light.     She goes to bed at 10 pm and takes gabapentin   She does not snore and does not have OSA signs.      In the evening she often uses her Android phone and/or watches TV.   I adjusted her phone to night mode at sunset.     Her GERD has done well on Protonix.   She is active and exercises at 530 or 7 pm for 45 minutes.         She has tried different teas without benefit.   She has tried melatonin without benefit.   She takes gabapentin now and then (has neck pain).   Initially she slept better with te gabapentin but now it does not seem to help.    She tried trazodone with some benefit but she feels there is a little hangover and she does not feel good.     She has  40-59% bilateral carotid stenosis.    Additionally, she has essential hypertension and hyperlipidemia.   REVIEW OF SYSTEMS: Constitutional: No fevers, chills, sweats, or change in appetite.  Insomnia. Eyes: No visual changes, double vision, eye pain Ear, nose and throat: No hearing loss, ear pain, nasal congestion, sore throat.   She has history of sinusitis intermittently. Cardiovascular: No chest pain, palpitations Respiratory: No shortness of breath at rest or with exertion.   No wheezes GastrointestinaI: No nausea, vomiting, diarrhea, abdominal pain, fecal incontinence.  She has constipation. Genitourinary: No dysuria, urinary retention or frequency.  No nocturia. Musculoskeletal: No neck pain.  she has some back pain and hip pain.   Integumentary: No rash, pruritus, skin lesions Neurological: as above Psychiatric: No depression at this time.  No anxiety Endocrine: No palpitations, diaphoresis, change in appetite, change in weigh or increased thirst Hematologic/Lymphatic: No anemia, purpura, petechiae. Allergic/Immunologic: No itchy/runny eyes, nasal congestion, recent allergic reactions, rashes  ALLERGIES: Allergies  Allergen Reactions  . Erythromycin Hives  . Penicillins Hives    Has patient had a PCN reaction causing immediate rash, facial/tongue/throat swelling, SOB or lightheadedness with hypotension: No Has patient had a PCN reaction causing severe rash involving mucus  membranes or skin necrosis: No Has patient had a PCN reaction that required hospitalization: No Has patient had a PCN reaction occurring within the last 10 years: No If all of the above answers are "NO", then may proceed with Cephalosporin use.   [name] Shellfish Allergy Hives    HOME MEDICATIONS:  Current Outpatient Medications:  .  acetaminophen (TYLENOL) 500 MG tablet, Take 1,000 mg by mouth every 6 (six) hours as needed (Pain)., Disp: , Rfl:  .  aspirin 81 MG tablet, Take 81 mg by mouth daily., Disp: ,  Rfl:  .  dicyclomine (BENTYL) 20 MG tablet, Take 1 tablet (20 mg total) by mouth every 6 (six) hours as needed for spasms., Disp: 180 tablet, Rfl: 0 .  ezetimibe (ZETIA) 10 MG tablet, TAKE 1 TABLET (10 MG TOTAL) BY MOUTH DAILY., Disp: 90 tablet, Rfl: 3 .  meloxicam (MOBIC) 15 MG tablet, Take 1 tablet (15 mg total) by mouth daily., Disp: 30 tablet, Rfl: 0 .  metoprolol tartrate (LOPRESSOR) 25 MG tablet, Take 12.5 mg by mouth 2 (two) times daily., Disp: , Rfl:  .  nitroGLYCERIN (NITROSTAT) 0.4 MG SL tablet, Place 1 tablet (0.4 mg total) under the tongue every 5 (five) minutes as needed for chest pain., Disp: 25 tablet, Rfl: 11 .  pantoprazole (PROTONIX) 40 MG tablet, Take 1 tablet (40 mg total) by mouth daily., Disp: 90 tablet, Rfl: 3 .  Passion Flower-Valerian 500-500 MG CAPS, Take 1 capsule by mouth daily., Disp: , Rfl:  .  ramipril (ALTACE) 10 MG capsule, Take 1 capsule (10 mg total) by mouth daily., Disp: 90 capsule, Rfl: 3 .  rosuvastatin (CRESTOR) 40 MG tablet, Take 1 tablet (40 mg total) by mouth daily., Disp: 90 tablet, Rfl: 3 .  traZODone (DESYREL) 50 MG tablet, Take 0.5-1 tablets (25-50 mg total) by mouth at bedtime., Disp: 30 tablet, Rfl: 0 .  Vitamin D, Ergocalciferol, (DRISDOL) 1.25 MG (50000 UNIT) CAPS capsule, Take 1 capsule (50,000 Units total) by mouth every 7 (seven) days., Disp: 4 capsule, Rfl: 0 .  gabapentin (NEURONTIN) 300 MG capsule, Take 1 capsule (300 mg total) by mouth at bedtime., Disp: 90 capsule, Rfl: 3  PAST MEDICAL HISTORY: Past Medical History:  Diagnosis Date  . Aortic valve sclerosis    echo 62/1308, soft systolic murmur  . 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 Back pain   . Carotid artery disease ([place])    40-59% bilateral, doppler December 2010  . Cataract   . Constipation   . Coronary artery disease    a. BMS-mid RCA 11/2007 b. stress echo 02/2009 subtle inferior HK, lateral ST depressions with stress c. follow-up cath 02/2009: RCA stent patent, severe but stable  stenosis of distal posterior lateral branch RCA. LV normal, med tx unless more sx c. ETT Myoview 08/27/12: negative for ischemia; EF 64%  . Decreased hearing   . Depression    Mild, August, 2012  . Dyslipidemia   . Easy bruising   . Ejection fraction    EF normal, catheterization, 2011 /  EF 55%, echo, 2010  . Fluid overload    Mild, stable since hospitalization in the past  . GERD (gastroesophageal reflux disease)    occarionally, takes Pepcid over the counter  . Heart murmur    has, occasional PVC's  . History of right hip replacement   . Hyperlipidemia   . Hypertension   . Joint pain   . Myocardial infarction ([place])    2009  .  Palpitations   . PVC (premature ventricular contraction)    per prior Holter monitor  . Right hip pain 12/2009  . Shellfish allergy   . Trouble in sleeping   . Vitamin B12 deficiency   . Vitamin D deficiency     PAST SURGICAL HISTORY: Past Surgical History:  Procedure Laterality Date  . 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/01/2005  . BREAST BIOPSY Left   . carcinoid tumor removal     colon-  . CARDIAC CATHETERIZATION  1/211   Patent RCA stent, stenotic PLB supplying small distribution; medically managed  . CATARACT EXTRACTION, BILATERAL    . CORONARY ANGIOPLASTY WITH STENT PLACEMENT  11/2007   BMS-mid RCA  . DIAGNOSTIC LAPAROSCOPY     1982 for endometriosis  . DILATION AND CURETTAGE OF UTERUS  2005  . SKIN CANCER EXCISION    . TOTAL HIP ARTHROPLASTY Right 07/08/2014   Procedure: RIGHT TOTAL HIP ARTHROPLASTY ANTERIOR APPROACH;  Surgeon: Mcarthur Rossetti, MD;  Location: [facility];  Service: Orthopedics;  Laterality: Right;  . WISDOM TOOTH EXTRACTION      FAMILY HISTORY: Family History  Problem Relation Age of Onset  . Heart attack Father 21  . Hyperlipidemia Father   . Hypertension Father   . Sudden death Father   . Stroke Mother   . Hyperlipidemia Mother   . Hypertension Mother   . Heart disease Mother   . Obesity Mother   . Heart attack  Paternal Grandfather   . Heart disease Sister   . BRCA 1/2 Neg Hx   . Breast cancer Neg Hx   . Esophageal cancer Neg Hx   . Colon cancer Neg Hx   . Pancreatic cancer Neg Hx   . Liver disease Neg Hx   . Stomach cancer Neg Hx     SOCIAL HISTORY:  Social History   Socioeconomic History  . Marital status: Divorced    Spouse name: Not on file  . Number of children: 1  . Years of education: 67  . Highest education level: Not on file  Occupational History  . Occupation: Retired - Retail buyer  Tobacco Use  . Smoking status: Never Smoker  . Smokeless tobacco: Never Used  Vaping Use  . Vaping Use: Never used  Substance and Sexual Activity  . Alcohol use: Yes    Alcohol/week: 7.0 standard drinks    Types: 7 Glasses of wine per week    Comment: 1 glass of wine 3x/week  . Drug use: No  . Sexual activity: Yes  Other Topics Concern  . Not on file  Social History Narrative   Right handed   Soda- sometimes   Drinks herbal tea   Fun: Dance, garden,    Denies religious beliefs effecting health care.   Denies abuse and feels safe at home   Social Determinants of Health   Financial Resource Strain: Low Risk   . Difficulty of Paying Living Expenses: Not hard at all  Food Insecurity: No Food Insecurity  . Worried About Charity fundraiser in the Last Year: Never true  . Ran Out of Food in the Last Year: Never true  Transportation Needs: No Transportation Needs  . Lack of Transportation (Medical): No  . Lack of Transportation (Non-Medical): No  Physical Activity: Sufficiently Active  . Days of Exercise per Week: 5 days  . Minutes of Exercise per Session: 30 min  Stress: No Stress Concern Present  . Feeling of Stress : Not at all  Social Connections: Moderately  Isolated  . Frequency of Communication with Friends and Family: More than three times a week  . Frequency of Social Gatherings with Friends and Family: More than three times a week  . Attends Religious Services:  Never  . Active Member of Clubs or Organizations: No  . Attends Archivist Meetings: Never  . Marital Status: Living with partner  Intimate Partner Violence: Not on file     PHYSICAL EXAM  Vitals:   05/15/20 0958  BP: 113/62  Pulse: (!) 52  Weight: 155 lb 8 oz (70.5 kg)  Height: 5' (1.524 m)    Body mass index is 30.37 kg/m.   General: The patient is well-developed and well-nourished and in no acute distress  Neck: The neck is supple.  Mild carotid bruits are noted.  The neck is nontender with good ROM  Cardiovascular: The heart has a regular rate and rhythm with a normal S1 and S2. There is a systolic murmur  Skin: Extremities are without significant edema.   Neurologic Exam  Mental status: The patient is alert and oriented x 3 at the time of the examination. The patient has apparent normal recent and remote memory, with an apparently normal attention span and concentration ability.   Speech is normal.  Cranial nerves: Extraocular movements are full.  Facial strength and sensation was normal.  Hearing was normal and symmetric.  Motor:  Muscle bulk is normal.   Tone is normal. Strength is  5 / 5 in all 4 extremities.   Sensory: Sensory testing is intact to pinprick, soft touch and vibration sensation in all 4 extremities.  Coordination: Cerebellar testing reveals good finger-nose-finger and heel-to-shin bilaterally.  Gait and station: Station is normal.   Gait is normal. Tandem gait is normal. Romberg is negative.   Reflexes: Deep tendon reflexes are symmetric and normal bilaterally.        DIAGNOSTIC DATA (LABS, IMAGING, TESTING) - I reviewed patient records, labs, notes, testing and imaging myself where available.  Lab Results  Component Value Date   WBC 7.3 01/03/2020   HGB 13.0 01/03/2020   HCT 37.7 01/03/2020   MCV 91.5 01/03/2020   PLT 196.0 01/03/2020      Component Value Date/Time   NA 140 03/30/2020 1030   K 4.8 03/30/2020 1030   CL  105 03/30/2020 1030   CO2 22 03/30/2020 1030   GLUCOSE 94 03/30/2020 1030   GLUCOSE 92 10/14/2017 1923   BUN 26 03/30/2020 1030   CREATININE 1.03 (H) 03/30/2020 1030   CALCIUM 9.4 03/30/2020 1030   PROT 6.5 03/30/2020 1030   ALBUMIN 4.4 03/30/2020 1030   AST 22 03/30/2020 1030   ALT 19 03/30/2020 1030   ALKPHOS 66 03/30/2020 1030   BILITOT 0.2 03/30/2020 1030   GFRNONAA 55 (L) 03/30/2020 1030   GFRAA 63 03/30/2020 1030   Lab Results  Component Value Date   CHOL 173 03/30/2020   HDL 67 03/30/2020   LDLCALC 91 03/30/2020   TRIG 84 03/30/2020   CHOLHDL 2.6 03/30/2020   Lab Results  Component Value Date   HGBA1C 5.8 (H) 03/30/2020   Lab Results  Component Value Date   VITAMINB12 365 08/04/2019   Lab Results  Component Value Date   TSH 1.75 01/03/2020       ASSESSMENT AND PLAN  Other insomnia  1.  Take gabapentin 300 mg and melatonin 5 mg 30-45 minutes before bedtime. 2.  If RLS worsens can add ropnirole 0.5 to 1 mg.  If insomnia not better, add doxepin 10 mg with the other meds 3.  rtc prn new or worsening issues.    Will get PSG if no improvement with a couple medicine regimens.    35-minute office visit with the majority of the time spent face-to-face for history and physical, discussion/counseling (about sleep habits and insomnia) and decision-making.  Additional time with record review and documentation.  Estreya Clay A. Felecia Shelling, MD, [facility] 05/30/8784, 7:67 PM Certified in Neurology, Clinical Neurophysiology, Sleep Medicine, Pain Medicine and Neuroimaging  [facility] [address] [place], [place] [address] [phone]

## 2020-05-18 ENCOUNTER — Other Ambulatory Visit: Payer: Self-pay

## 2020-05-18 ENCOUNTER — Ambulatory Visit
Admission: RE | Admit: 2020-05-18 | Discharge: 2020-05-18 | Disposition: A | Discharge status: Home / Self Care | Payer: Medicare HMO | Source: Ambulatory Visit | Attending: Obstetrics and Gynecology | Admitting: Obstetrics and Gynecology

## 2020-05-18 DIAGNOSIS — Z1231 Encounter for screening mammogram for malignant neoplasm of breast: Secondary | ICD-10-CM

## 2020-05-18 NOTE — Progress Notes (Signed)
[place] Phone: [phone] Subjective:   Courtney [name], am serving as a scribe for Dr. Hulan Saas. This visit occurred during the SARS-CoV-2 public health emergency.  Safety protocols were in place, including screening questions prior to the visit, additional usage of staff PPE, and extensive cleaning of exam room while observing appropriate contact time as indicated for disinfecting solutions.   I'm seeing this patient by the request  of:  Marrian Salvage, FNP  CC: foot pain follow up   ELT:[id number]   04/07/2020 Patient has what appears to be more of a stress reaction or stress fracture of the foot.  Seems to be in the midfoot area.  [name] true cortical irregularity noted but increasing Doppler flow to the cortex.  Patient also had what appeared to be a drop cuboid injury did help back.  CAM Walker given today, continue once weekly vitamin D and discussed K2 short-term.  Patient will only do it up to her trip and then discontinue it.  Increase gabapentin to 300 mg to help with sleep which patient has been doing already at this time.  Patient will follow up with me again 4 to 6 weeks for further evaluation and treatment.   Update 05/19/2020 Courtney [name] is a 70 y.o. female coming in with complaint of L foot fx. Is doing better. Work boot up until 1 week ago and is now using a steal shank in L shoe. Wearing flip flops increases pain. [name] pain with tennis shoes.   04/07/2020 L foot xray IMPRESSION: 1. [name] acute abnormality. 2. Moderate plantar calcaneal spur. 3. Minimal midfoot osteoarthritis.    Past Medical History:  Diagnosis Date  . Aortic valve sclerosis    echo 68/6168, soft systolic murmur  . 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 Back pain   . Carotid artery disease ([place])    40-59% bilateral, doppler December 2010  . Cataract   . Constipation   . Coronary artery disease    a. BMS-mid RCA 11/2007 b.  stress echo 02/2009 subtle inferior HK, lateral ST depressions with stress c. follow-up cath 02/2009: RCA stent patent, severe but stable stenosis of distal posterior lateral branch RCA. LV normal, med tx unless more sx c. ETT Myoview 08/27/12: negative for ischemia; EF 64%  . Decreased hearing   . Depression    Mild, August, 2012  . Dyslipidemia   . Easy bruising   . Ejection fraction    EF normal, catheterization, 2011 /  EF 55%, echo, 2010  . Fluid overload    Mild, stable since hospitalization in the past  . GERD (gastroesophageal reflux disease)    occarionally, takes Pepcid over the counter  . Heart murmur    has, occasional PVC's  . History of right hip replacement   . Hyperlipidemia   . Hypertension   . Joint pain   . Myocardial infarction ([place])    2009  . Palpitations   . PVC (premature ventricular contraction)    per prior Holter monitor  . Right hip pain 12/2009  . Shellfish allergy   . Trouble in sleeping   . Vitamin B12 deficiency   . Vitamin D deficiency    Past Surgical History:  Procedure Laterality Date  . 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/01/2005  . BREAST BIOPSY Left   . carcinoid tumor removal     colon-  . CARDIAC CATHETERIZATION  1/211   Patent RCA  stent, stenotic PLB supplying small distribution; medically managed  . CATARACT EXTRACTION, BILATERAL    . CORONARY ANGIOPLASTY WITH STENT PLACEMENT  11/2007   BMS-mid RCA  . DIAGNOSTIC LAPAROSCOPY     1982 for endometriosis  . DILATION AND CURETTAGE OF UTERUS  2005  . SKIN CANCER EXCISION    . TOTAL HIP ARTHROPLASTY Right 07/08/2014   Procedure: RIGHT TOTAL HIP ARTHROPLASTY ANTERIOR APPROACH;  Surgeon: Mcarthur Rossetti, MD;  Location: [facility];  Service: Orthopedics;  Laterality: Right;  . WISDOM TOOTH EXTRACTION     Social History   Socioeconomic History  . Marital status: Divorced    Spouse name: Not on file  . Number of children: 1  . Years of education: 52  . Highest education level: Not on file   Occupational History  . Occupation: Retired - Retail buyer  Tobacco Use  . Smoking status: Never Smoker  . Smokeless tobacco: Never Used  Vaping Use  . Vaping Use: Never used  Substance and Sexual Activity  . Alcohol use: Yes    Alcohol/week: 7.0 standard drinks    Types: 7 Glasses of wine per week    Comment: 1 glass of wine 3x/week  . Drug use: [name]  . Sexual activity: Yes  Other Topics Concern  . Not on file  Social History Narrative   Right handed   Soda- sometimes   Drinks herbal tea   Fun: Dance, garden,    Denies religious beliefs effecting health care.   Denies abuse and feels safe at home   Social Determinants of Health   Financial Resource Strain: Low Risk   . Difficulty of Paying Living Expenses: Not hard at all  Food Insecurity: [name] Food Insecurity  . Worried About Charity fundraiser in the Last Year: Never true  . Ran Out of Food in the Last Year: Never true  Transportation Needs: [name] Transportation Needs  . Lack of Transportation (Medical): [name]  . Lack of Transportation (Non-Medical): [name]  Physical Activity: Sufficiently Active  . Days of Exercise per Week: 5 days  . Minutes of Exercise per Session: 30 min  Stress: [name] Stress Concern Present  . Feeling of Stress : Not at all  Social Connections: Moderately Isolated  . Frequency of Communication with Friends and Family: More than three times a week  . Frequency of Social Gatherings with Friends and Family: More than three times a week  . Attends Religious Services: Never  . Active Member of Clubs or Organizations: [name]  . Attends Archivist Meetings: Never  . Marital Status: Living with partner   Allergies  Allergen Reactions  . Erythromycin Hives  . Penicillins Hives    Has patient had a PCN reaction causing immediate rash, facial/tongue/throat swelling, SOB or lightheadedness with hypotension: [name] Has patient had a PCN reaction causing severe rash involving mucus membranes or skin necrosis:  [name] Has patient had a PCN reaction that required hospitalization: [name] Has patient had a PCN reaction occurring within the last 10 years: [name] If all of the above answers are "[name]", then may proceed with Cephalosporin use.   . Shellfish Allergy Hives   Family History  Problem Relation Age of Onset  . Heart attack Father 50  . Hyperlipidemia Father   . Hypertension Father   . Sudden death Father   . Stroke Mother   . Hyperlipidemia Mother   . Hypertension Mother   . Heart disease Mother   . Obesity Mother   .  Heart attack Paternal Grandfather   . Heart disease Sister   . BRCA 1/2 Neg Hx   . Breast cancer Neg Hx   . Esophageal cancer Neg Hx   . Colon cancer Neg Hx   . Pancreatic cancer Neg Hx   . Liver disease Neg Hx   . Stomach cancer Neg Hx      Current Outpatient Medications (Cardiovascular):  .  ezetimibe (ZETIA) 10 MG tablet, TAKE 1 TABLET (10 MG TOTAL) BY MOUTH DAILY. .  metoprolol tartrate (LOPRESSOR) 25 MG tablet, Take 12.5 mg by mouth 2 (two) times daily. .  nitroGLYCERIN (NITROSTAT) 0.4 MG SL tablet, Place 1 tablet (0.4 mg total) under the tongue every 5 (five) minutes as needed for chest pain. .  ramipril (ALTACE) 10 MG capsule, Take 1 capsule (10 mg total) by mouth daily. .  rosuvastatin (CRESTOR) 40 MG tablet, Take 1 tablet (40 mg total) by mouth daily.   Current Outpatient Medications (Analgesics):  .  acetaminophen (TYLENOL) 500 MG tablet, Take 1,000 mg by mouth every 6 (six) hours as needed (Pain). [name]  aspirin 81 MG tablet, Take 81 mg by mouth daily.   Current Outpatient Medications (Other):  .  dicyclomine (BENTYL) 20 MG tablet, Take 1 tablet (20 mg total) by mouth every 6 (six) hours as needed for spasms. [name]  gabapentin (NEURONTIN) 300 MG capsule, Take 1 capsule (300 mg total) by mouth at bedtime. .  pantoprazole (PROTONIX) 40 MG tablet, Take 1 tablet (40 mg total) by mouth daily. .  Passion Flower-Valerian 500-500 MG CAPS, Take 1 capsule by mouth daily. .   traZODone (DESYREL) 50 MG tablet, Take 0.5-1 tablets (25-50 mg total) by mouth at bedtime. .  Vitamin D, Ergocalciferol, (DRISDOL) 1.25 MG (50000 UNIT) CAPS capsule, Take 1 capsule (50,000 Units total) by mouth every 7 (seven) days.   Reviewed prior external information including notes and imaging from  primary care provider As well as notes that were available from care everywhere and other healthcare systems.  Past medical history, social, surgical and family history all reviewed in electronic medical record.  [name] pertanent information unless stated regarding to the chief complaint.   Review of Systems:  [name] headache, visual changes, nausea, vomiting, diarrhea, constipation, dizziness, abdominal pain, skin rash, fevers, chills, night sweats, weight loss, swollen lymph nodes, body aches, joint swelling, chest pain, shortness of breath, mood changes. POSITIVE muscle aches  Objective  Blood pressure 100/62, pulse (!) 52, height 5' (1.524 m), weight 155 lb (70.3 kg), SpO2 97 %.   General: [name] apparent distress alert and oriented x3 mood and affect normal, dressed appropriately.  HEENT: Pupils equal, extraocular movements intact  Respiratory: Patient's speak in full sentences and does not appear short of breath  Cardiovascular: [name] lower extremity edema, non tender, [name] erythema  Gait improved but still favoring the left foot Foot exam shows the patient does have some mild tenderness to palpation over the dorsal aspect of the left foot still.  [name] significant swelling.  Neurovascularly intact distally.  Does have breakdown of the transverse arch noted.  Low back exam does have some loss of lordosis.  Mild positive Corky Sox.  Tightness with straight leg test.  Limited musculoskeletal ultrasound was performed and interpreted by Lyndal Pulley  Limited ultrasound of patient's left foot shows the patient does have midfoot arthritis noted and patient does have a small effusion of the joint noted.  Patient  know where the cortical irregularity was does still have some mild  increase in neovascularization.  Soft callus formation noted but [name] hard callus formation noted.   Impression and Recommendations:     The above documentation has been reviewed and is accurate and complete Lyndal Pulley, DO

## 2020-05-19 ENCOUNTER — Ambulatory Visit (INDEPENDENT_AMBULATORY_CARE_PROVIDER_SITE_OTHER): Payer: Medicare HMO

## 2020-05-19 ENCOUNTER — Encounter: Payer: Self-pay | Admitting: Family Medicine

## 2020-05-19 ENCOUNTER — Ambulatory Visit: Payer: Medicare HMO | Admitting: Family Medicine

## 2020-05-19 ENCOUNTER — Ambulatory Visit: Payer: Self-pay

## 2020-05-19 VITALS — BP 100/62 | HR 52 | Ht 60.0 in | Wt 155.0 lb

## 2020-05-19 DIAGNOSIS — M79672 Pain in left foot: Secondary | ICD-10-CM | POA: Diagnosis not present

## 2020-05-19 DIAGNOSIS — M5136 Other intervertebral disc degeneration, lumbar region: Secondary | ICD-10-CM | POA: Diagnosis not present

## 2020-05-19 DIAGNOSIS — M545 Low back pain, unspecified: Secondary | ICD-10-CM | POA: Diagnosis not present

## 2020-05-19 DIAGNOSIS — M84375A Stress fracture, left foot, initial encounter for fracture: Secondary | ICD-10-CM

## 2020-05-19 NOTE — Assessment & Plan Note (Signed)
Patient is having significant arthritic changes.  Discussed icing regimen and home exercises, discussed which activities to do which wants to avoid.  Patient will get x-rays today.  Patient is having severe pain that is stopping her from activity with some intermittent radicular symptoms and will consider the possibility of imaging at follow-up.

## 2020-05-19 NOTE — Patient Instructions (Signed)
Foot is improving No real changes today Will discuss orthotics at follow up Back xray  Talk to rheumatology Exercises 3x a week If not better in 3 weeks can write me and will order MRI See me in 6 weeks for the back

## 2020-05-19 NOTE — Assessment & Plan Note (Signed)
Patient has made some improvement overall.  Discussed which activities to do management significantly.  Patient is to continue the carbon fiber plate as well as the good shoes.  Avoid being barefoot for a while longer.  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  If the bone is well-healed and still has some effusion of the midfoot can consider the possibility of injections.  Follow-up with me again 6 weeks

## 2020-05-22 ENCOUNTER — Encounter: Payer: Self-pay | Admitting: Family Medicine

## 2020-05-22 ENCOUNTER — Ambulatory Visit: Payer: Medicare HMO | Admitting: Internal Medicine

## 2020-05-22 ENCOUNTER — Other Ambulatory Visit: Payer: Self-pay

## 2020-05-22 ENCOUNTER — Encounter: Payer: Self-pay | Admitting: Internal Medicine

## 2020-05-22 VITALS — BP 130/73 | HR 50 | Ht 59.75 in | Wt 156.2 lb

## 2020-05-22 DIAGNOSIS — M1611 Unilateral primary osteoarthritis, right hip: Secondary | ICD-10-CM

## 2020-05-22 DIAGNOSIS — M2550 Pain in unspecified joint: Secondary | ICD-10-CM | POA: Diagnosis not present

## 2020-05-22 DIAGNOSIS — M1711 Unilateral primary osteoarthritis, right knee: Secondary | ICD-10-CM | POA: Diagnosis not present

## 2020-05-22 DIAGNOSIS — M5030 Other cervical disc degeneration, unspecified cervical region: Secondary | ICD-10-CM

## 2020-05-22 DIAGNOSIS — M5136 Other intervertebral disc degeneration, lumbar region: Secondary | ICD-10-CM

## 2020-05-22 NOTE — Progress Notes (Signed)
Office Visit Note  Patient: Courtney [name]             Date of Birth: [birth year]           MRN: [id number]             PCP: Marrian Salvage, FNP Referring: Marrian Salvage,* Visit Date: 05/22/2020  Occupation: Retired Midwife   Subjective:  New Patient (Initial Visit) (Patient complains of low back pain and stiffness and bilateral hand/wrist pain. )   History of Present Illness: Courtney [name] is a 70 y.o. female here for evaluation and management of arthritis. She has longstanding history of arthritis in her hands and in her back and takes ibuprofen for this with a fairly good benefit. She previously had right hip replacement surgery with good relief of joint pain but never achieved a normal range of motion afterwards with some residual tightness and muscle pain in that area. She worked with occupational therapy for thumb injury about 2 years ago improved conservatively. Recently the change is an increase in the low back pain it bothers her every day now, worst when standing up or rising from bending position. Her left foot also sustained a stress fracture that was in a boot temporarily but now back to full weight bearing. She was tried on meloxicam but this was not as good for her as the ibuprofen. She does not notice significant joint swelling, redness, or warmth.  Labs reviewed 03/2020 Vit D 62.7 Lipids wnl TSH 1.75 ESR 7 CRP wnl ANA neg RF neg  03/2020 Xray left foot IMPRESSION: 1. No acute abnormality. 2. Moderate plantar calcaneal spur. 3. Minimal midfoot osteoarthritis.  05/2020 Xray lumbar spine IMPRESSION: 1. Advanced disc height loss is noted at the L1-L2 level. The appearance is rather aggressive. As such, correlation for signs and symptoms discitis is recommended. 2. No acute compression fracture. 3. Degenerative changes are noted throughout the remaining portions of the lumbar spine.  Activities of Daily Living:  Patient reports morning  stiffness for 24 hours.   Patient Reports nocturnal pain.  Difficulty dressing/grooming: Denies Difficulty climbing stairs: Denies Difficulty getting out of chair: Reports Difficulty using hands for taps, buttons, cutlery, and/or writing: Reports  Review of Systems  Constitutional: Negative for fatigue.  HENT: Negative for mouth sores, mouth dryness and nose dryness.   Eyes: Negative for pain, itching, visual disturbance and dryness.  Respiratory: Negative for cough, hemoptysis, shortness of breath and difficulty breathing.   Cardiovascular: Negative for chest pain, palpitations and swelling in legs/feet.  Gastrointestinal: Positive for constipation. Negative for abdominal pain, blood in stool and diarrhea.  Endocrine: Negative for increased urination.  Genitourinary: Negative for painful urination.  Musculoskeletal: Positive for arthralgias, joint pain, muscle weakness and morning stiffness. Negative for joint swelling, myalgias, muscle tenderness and myalgias.  Skin: Negative for color change, rash and redness.  Allergic/Immunologic: Negative for susceptible to infections.  Neurological: Negative for dizziness, numbness, headaches, memory loss and weakness.  Hematological: Negative for swollen glands.  Psychiatric/Behavioral: Positive for sleep disturbance. Negative for confusion.    PMFS History:  Patient Active Problem List   Diagnosis Date Noted  . Degenerative disc disease, lumbar 05/19/2020  . Restless leg syndrome 05/15/2020  . Metatarsal stress fracture of left foot 04/07/2020  . Degenerative disc disease, cervical 02/02/2020  . Polyarthralgia 01/03/2020  . Patellofemoral arthritis of right knee 06/16/2019  . Vitamin D deficiency, Vit D = 43.2 (01/25/19), Rx vit D 50K IU every 2 weeks  08/04/2018  . Cyst of right breast 05/07/2018  . Chronic headache disorder 02/08/2018  . Insomnia 07/15/2017  . Female stress incontinence 07/15/2017  . Gastroesophageal reflux disease with  esophagitis, Rx Protonix 02/25/2017  . Osteopenia 06/27/2015  . Osteoarthritis of right hip 07/08/2014  . Carotid artery disease ([place])   . Aortic valve sclerosis   . Mixed hyperlipidemia, Rx Crestor and Zetia   . Coronary artery disease     Past Medical History:  Diagnosis Date  . Aortic valve sclerosis    echo 69/7948, soft systolic murmur  . 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 Back pain   . Carotid artery disease ([place])    40-59% bilateral, doppler December 2010  . Cataract   . Constipation   . Coronary artery disease    a. BMS-mid RCA 11/2007 b. stress echo 02/2009 subtle inferior HK, lateral ST depressions with stress c. follow-up cath 02/2009: RCA stent patent, severe but stable stenosis of distal posterior lateral branch RCA. LV normal, med tx unless more sx c. ETT Myoview 08/27/12: negative for ischemia; EF 64%  . Decreased hearing   . Depression    Mild, August, 2012  . Dyslipidemia   . Easy bruising   . Ejection fraction    EF normal, catheterization, 2011 /  EF 55%, echo, 2010  . Fluid overload    Mild, stable since hospitalization in the past  . GERD (gastroesophageal reflux disease)    occarionally, takes Pepcid over the counter  . Heart murmur    has, occasional PVC's  . History of right hip replacement   . Hyperlipidemia   . Hypertension   . Joint pain   . Myocardial infarction ([place])    2009  . Palpitations   . PVC (premature ventricular contraction)    per prior Holter monitor  . Right hip pain 12/2009  . Shellfish allergy   . Stress fracture    Left foot  . Trouble in sleeping   . Vitamin B12 deficiency   . Vitamin D deficiency     Family History  Problem Relation Age of Onset  . Heart attack Father 38  . Hyperlipidemia Father   . Hypertension Father   . Sudden death Father   . Stroke Mother   . Hyperlipidemia Mother   . Hypertension Mother   . Heart disease Mother   . Obesity Mother   . Heart attack Paternal Grandfather   . Heart disease Sister    . BRCA 1/2 Neg Hx   . Breast cancer Neg Hx   . Esophageal cancer Neg Hx   . Colon cancer Neg Hx   . Pancreatic cancer Neg Hx   . Liver disease Neg Hx   . Stomach cancer Neg Hx    Past Surgical History:  Procedure Laterality Date  . 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/01/2005  . BREAST BIOPSY Left   . carcinoid tumor removal     colon-  . CARDIAC CATHETERIZATION  1/211   Patent RCA stent, stenotic PLB supplying small distribution; medically managed  . CATARACT EXTRACTION, BILATERAL    . CORONARY ANGIOPLASTY WITH STENT PLACEMENT  11/2007   BMS-mid RCA  . DIAGNOSTIC LAPAROSCOPY     1982 for endometriosis  . DILATION AND CURETTAGE OF UTERUS  2005  . SKIN CANCER EXCISION    . TOTAL HIP ARTHROPLASTY Right 07/08/2014   Procedure: RIGHT TOTAL HIP ARTHROPLASTY ANTERIOR APPROACH;  Surgeon: Mcarthur Rossetti, MD;  Location: [facility];  Service: Orthopedics;  Laterality: Right;  . WISDOM TOOTH EXTRACTION     Social History   Social History Narrative   Right handed   Soda- sometimes   Drinks herbal tea   Fun: Dance, garden,    Denies religious beliefs effecting health care.   Denies abuse and feels safe at home   Immunization History  Administered Date(s) Administered  . Fluad Quad(high Dose 65+) 10/20/2018, 12/09/2019  . Influenza, High Dose Seasonal PF 12/03/2016, 11/17/2017  . Influenza-Unspecified 10/20/2014  . PFIZER(Purple Top)SARS-COV-2 Vaccination 02/26/2019, 03/17/2019, 12/09/2019  . Pneumococcal Conjugate-13 02/18/2014, 01/06/2015  . Pneumococcal Polysaccharide-23 11/29/2015  . Pneumococcal-Unspecified 02/18/2014  . Tdap 11/29/2015  . Zoster Recombinat (Shingrix) 12/26/2016, 02/25/2017     Objective: Vital Signs: BP 130/73 (BP Location: Right Arm, Patient Position: Sitting, Cuff Size: Normal)   Pulse (!) 50   Ht 4' 11.75" (1.518 m)   Wt 156 lb 3.2 oz (70.9 kg)   BMI 30.76 kg/m    Physical Exam HENT:     Right Ear: External ear normal.     Left Ear: External ear normal.   Eyes:     Conjunctiva/sclera: Conjunctivae normal.  Skin:    General: Skin is warm and dry.     Findings: No rash.  Neurological:     General: No focal deficit present.     Mental Status: She is alert.  Psychiatric:        Mood and Affect: Mood normal.     Musculoskeletal Exam:  Neck full ROM no tenderness Shoulders full ROM no tenderness or swelling Elbows full ROM no tenderness or swelling Wrists full ROM no tenderness or swelling Fingers squaring of thumbs, extensive heberdon's nodes largest at DIP joints no tenderness or synovitis Right paraspinal muscle tenderness to palpation extending laterally above iliac crest Right hip internal rotation and Pace maneuver ROM slightly reduced, negative SLR Knees full ROM no tenderness or swelling some crepitus b/l Ankles full ROM no tenderness or swelling MTPs negative squeeze test   Investigation: No additional findings.  Imaging: DG Lumbar Spine 2-3 Views  Result Date: 05/21/2020 CLINICAL DATA:  Lumbar pain. EXAM: LUMBAR SPINE - 2-3 VIEW COMPARISON:  None. FINDINGS: There is advanced disc degenerative change at the L1-L2 level. There is a degenerative levoscoliosis centered in the mid lumbar spine. There is facet arthrosis in the lower lumbar segments. There is moderate disc height loss at the L5-S1 level. There is mild disc height loss at the L2-L3 level. Advanced aortic calcifications are noted. IMPRESSION: 1. Advanced disc height loss is noted at the L1-L2 level. The appearance is rather aggressive. As such, correlation for signs and symptoms discitis is recommended. 2. No acute compression fracture. 3. Degenerative changes are noted throughout the remaining portions of the lumbar spine. Electronically Signed   By: Constance Holster [name]   On: 05/21/2020 21:25   MM 3D SCREEN BREAST BILATERAL  Result Date: 05/19/2020 CLINICAL DATA:  Screening. EXAM: DIGITAL SCREENING BILATERAL MAMMOGRAM WITH TOMOSYNTHESIS AND CAD TECHNIQUE: Bilateral  screening digital craniocaudal and mediolateral oblique mammograms were obtained. Bilateral screening digital breast tomosynthesis was performed. The images were evaluated with computer-aided detection. COMPARISON:  Previous exam(s). ACR Breast Density Category c: The breast tissue is heterogeneously dense, which may obscure small masses. FINDINGS: There are no findings suspicious for malignancy. The images were evaluated with computer-aided detection. IMPRESSION: No mammographic evidence of malignancy. A result letter of this screening mammogram will be mailed directly to the patient. RECOMMENDATION: Screening mammogram in one year. (Code:SM-B-01Y) BI-RADS CATEGORY  1: Negative. Electronically Signed   By: Kristopher Oppenheim [name]   On: 05/19/2020 13:44    Recent Labs: Lab Results  Component Value Date   WBC 7.3 01/03/2020   HGB 13.0 01/03/2020   PLT 196.0 01/03/2020   NA 140 03/30/2020   K 4.8 03/30/2020   CL 105 03/30/2020   CO2 22 03/30/2020   GLUCOSE 94 03/30/2020   BUN 26 03/30/2020   CREATININE 1.03 (H) 03/30/2020   BILITOT 0.2 03/30/2020   ALKPHOS 66 03/30/2020   AST 22 03/30/2020   ALT 19 03/30/2020   PROT 6.5 03/30/2020   ALBUMIN 4.4 03/30/2020   CALCIUM 9.4 03/30/2020   GFRAA 63 03/30/2020    Speciality Comments: No specialty comments available.  Procedures:  No procedures performed Allergies: Erythromycin, Penicillins, and Shellfish allergy   Assessment / Plan:     Visit Diagnoses: Polyarthralgia  Symptoms look to be generalized osteoarthritis. I do not see inflammatory joint changes on exam or on reviewed imaging. We discussed multiple OA treatments. She is not a good candidate for NSAIDs due to cardiovascular risks. Discussed trial of compression gloves for hand OA. Not interested in OT or injection therapies at present, her back is the worst affected area right now.  Degenerative disc disease, cervical Degenerative disc disease, lumbar  Lumbar disease currently the  worst issue, probably disc related upper lumbar levels. This is not likely to improve much from exercise therapy if there is some compression occurring. Does not look like an inflammatory/rheumatoid arthritis process. Recommend following up with orthopedics for management.  Primary osteoarthritis of right hip  S/p arthroplasty ROM currently limited by muscular tightness and probable some contracture chronically postoperatively. Recommended focusing on gluteus medius stretching exercise for the currently involved area.  Patellofemoral arthritis of right knee  Right knee crepitus but no swelling, no instability, pain is more to the lateral side so might benefit with continuing ROM exercises.   Orders: No orders of the defined types were placed in this encounter.  No orders of the defined types were placed in this encounter.   Follow-Up Instructions: Return if symptoms worsen or fail to improve.   Collier Salina, MD  Note - This record has been created using [employer].  Chart creation errors have been sought, but may not always  have been located. Such creation errors do not reflect on  the standard of medical care.

## 2020-05-22 NOTE — Patient Instructions (Addendum)
I recommend trying arthritis compression gloves for the hands to stabilize the joints especially thumbs. I recommend follow up with orthopedics for the lumbar spine issue.  There is also a component of muscular problem contributing to pain across your back, this seems most consistent with glute medius area of problems and some tightness in paraspinal muscles.  Gluteus Medius Syndrome Rehab Ask your health care provider which exercises are safe for you. Do exercises exactly as told by your health care provider and adjust them as directed. It is normal to feel mild stretching, pulling, tightness, or discomfort as you do these exercises. Stop right away if you feel sudden pain or your pain gets worse. Do not begin these exercises until told by your health care provider. Stretching and range-of-motion exercise This exercise warms up your muscles and joints and improves the movement and flexibility of your hip and pelvis. This exercise also helps to relieve muscle and joint pain and stiffness. Lunge This exercise is also called hip flexor stretch. 1. Kneel on the floor on your left / right knee. Bend your other knee so it is directly over your ankle. 2. Keep good posture with your head over your shoulders. Tuck your tailbone underneath you. This will prevent your back from arching too much. 3. You should feel a gentle stretch in the front of your back thigh or hip (hip flexors). If you do not feel a stretch, slowly lunge forward with your chest up. 4. Hold this position for __________ seconds. 5. Slowly return to the starting position. Repeat __________ times. Complete this exercise __________ times a day.   Strengthening exercises These exercises build strength and endurance in your hip and pelvis. Endurance is the ability to use your muscles for a long time, even after they get tired.    Straight leg raises, side-lying This exercise strengthens the muscles that rotate the leg at the hip and move it  away from your body (hip abductors). 1. Lie on your side with your left / right leg in the top position. Lie so your head, shoulder, knee, and hip line up. Bend your bottom knee slightly to help you balance. 2. Lift your top leg 4-6 inches (10-15 cm) while keeping your toes pointed straight ahead. 3. Hold this position for __________ seconds. 4. Slowly lower your leg to the starting position. 5. Let your muscles relax completely after each repetition. Repeat __________ times. Complete this exercise __________ times a day.

## 2020-05-29 ENCOUNTER — Encounter (INDEPENDENT_AMBULATORY_CARE_PROVIDER_SITE_OTHER): Payer: Self-pay | Admitting: Family Medicine

## 2020-05-29 ENCOUNTER — Other Ambulatory Visit: Payer: Self-pay

## 2020-05-29 ENCOUNTER — Ambulatory Visit (INDEPENDENT_AMBULATORY_CARE_PROVIDER_SITE_OTHER): Payer: Medicare HMO | Admitting: Family Medicine

## 2020-05-29 VITALS — BP 109/65 | HR 48 | Temp 97.5°F | Ht 60.0 in | Wt 149.0 lb

## 2020-05-29 DIAGNOSIS — E559 Vitamin D deficiency, unspecified: Secondary | ICD-10-CM

## 2020-05-29 DIAGNOSIS — Z6830 Body mass index (BMI) 30.0-30.9, adult: Secondary | ICD-10-CM

## 2020-05-29 DIAGNOSIS — E669 Obesity, unspecified: Secondary | ICD-10-CM

## 2020-05-29 MED ORDER — VITAMIN D (ERGOCALCIFEROL) 1.25 MG (50000 UNIT) PO CAPS: 50000.0000 [IU] | ORAL_CAPSULE | ORAL | 0 refills | Status: DC

## 2020-06-07 ENCOUNTER — Other Ambulatory Visit: Payer: Self-pay

## 2020-06-07 ENCOUNTER — Ambulatory Visit
Admission: RE | Admit: 2020-06-07 | Discharge: 2020-06-07 | Disposition: A | Discharge status: Home / Self Care | Payer: Medicare HMO | Source: Ambulatory Visit | Attending: Family Medicine | Admitting: Family Medicine

## 2020-06-07 ENCOUNTER — Encounter: Payer: Self-pay | Admitting: Family Medicine

## 2020-06-07 DIAGNOSIS — M5136 Other intervertebral disc degeneration, lumbar region: Secondary | ICD-10-CM

## 2020-06-07 DIAGNOSIS — M48061 Spinal stenosis, lumbar region without neurogenic claudication: Secondary | ICD-10-CM | POA: Diagnosis not present

## 2020-06-07 DIAGNOSIS — M5416 Radiculopathy, lumbar region: Secondary | ICD-10-CM

## 2020-06-07 DIAGNOSIS — M545 Low back pain, unspecified: Secondary | ICD-10-CM | POA: Diagnosis not present

## 2020-06-07 NOTE — Progress Notes (Signed)
Chief Complaint:   OBESITY Courtney [name] is here to discuss her progress with her obesity treatment plan along with follow-up of her obesity related diagnoses. Courtney [name] is on keeping a food journal and adhering to recommended goals of 1000 calories and 70 grams of protein daily and states she is following her eating plan approximately 85% of the time. Courtney [name] states she is doing aerobics for 45 minutes 3 times per week.  Today's visit was #: 48 Starting weight: 157 lbs Starting date: 06/16/2017 Today's weight: 149 lbs Today's date: 05/29/2020 Total lbs lost to date: 8 Total lbs lost since last in-office visit: 0  Interim History: Courtney [name] was on vacation to [place] for a wedding. She is now going to the [employer] and she is doing more water exercises. She is eating a lot of vegetables and working on meeting her protein goals.  Subjective:   1. Vitamin D deficiency Courtney [name] is stable on Vit D, and her last level was at goal. She denies signs of over-replacement.  Assessment/Plan:   1. Vitamin D deficiency Low Vitamin D level contributes to fatigue and are associated with obesity, breast, and colon cancer. We will refill prescription Vitamin D for 1 month. Courtney [name] will follow-up for routine testing of Vitamin D, at least 2-3 times per year to avoid over-replacement.  - Vitamin D, Ergocalciferol, (DRISDOL) 1.25 MG (50000 UNIT) CAPS capsule; Take 1 capsule (50,000 Units total) by mouth every 7 (seven) days.  Dispense: 4 capsule; Refill: 0  2. Obesity with current BMI of 29.1 Courtney [name] is currently in the action stage of change. As such, her goal is to continue with weight loss efforts. She has agreed to the Category 1 Plan.   Exercise goals: As is.  Behavioral modification strategies: no skipping meals.  Courtney [name] has agreed to follow-up with our clinic in 4 weeks. She was informed of the importance of frequent follow-up visits to maximize her success with intensive lifestyle modifications for her  multiple health conditions.   Objective:   Blood pressure 109/65, pulse (!) 48, temperature (!) 97.5 F (36.4 C), height 5' (1.524 m), weight 149 lb (67.6 kg), SpO2 99 %. Body mass index is 29.1 kg/m.  General: Cooperative, alert, well developed, in no acute distress. HEENT: Conjunctivae and lids unremarkable. Cardiovascular: Regular rhythm.  Lungs: Normal work of breathing. Neurologic: No focal deficits.   Lab Results  Component Value Date   CREATININE 1.03 (H) 03/30/2020   BUN 26 03/30/2020   NA 140 03/30/2020   K 4.8 03/30/2020   CL 105 03/30/2020   CO2 22 03/30/2020   Lab Results  Component Value Date   ALT 19 03/30/2020   AST 22 03/30/2020   ALKPHOS 66 03/30/2020   BILITOT 0.2 03/30/2020   Lab Results  Component Value Date   HGBA1C 5.8 (H) 03/30/2020   HGBA1C 5.6 12/01/2019   HGBA1C 5.7 (H) 08/04/2019   HGBA1C 5.4 09/14/2018   HGBA1C 5.6 03/03/2018   Lab Results  Component Value Date   INSULIN 9.4 03/30/2020   INSULIN 7.2 12/01/2019   INSULIN 4.9 08/04/2019   INSULIN 4.2 09/14/2018   INSULIN 5.0 03/03/2018   Lab Results  Component Value Date   TSH 1.75 01/03/2020   Lab Results  Component Value Date   CHOL 173 03/30/2020   HDL 67 03/30/2020   LDLCALC 91 03/30/2020   TRIG 84 03/30/2020   CHOLHDL 2.6 03/30/2020   Lab Results  Component Value Date   WBC 7.3 01/03/2020  HGB 13.0 01/03/2020   HCT 37.7 01/03/2020   MCV 91.5 01/03/2020   PLT 196.0 01/03/2020   Lab Results  Component Value Date   IRON 114 01/03/2020   TIBC 328 01/25/2019   FERRITIN 54 01/25/2019    Obesity Behavioral Intervention:   Approximately 15 minutes were spent on the discussion below.  ASK: We discussed the diagnosis of obesity with Courtney [name] today and Courtney [name] agreed to give [place] permission to discuss obesity behavioral modification therapy today.  ASSESS: Courtney [name] has the diagnosis of obesity and her BMI today is 29.1. Courtney [name] is in the action stage of change.    ADVISE: Courtney [name] was educated on the multiple health risks of obesity as well as the benefit of weight loss to improve her health. She was advised of the need for long term treatment and the importance of lifestyle modifications to improve her current health and to decrease her risk of future health problems.  AGREE: Multiple dietary modification options and treatment options were discussed and Courtney [name] agreed to follow the recommendations documented in the above note.  ARRANGE: Courtney [name] was educated on the importance of frequent visits to treat obesity as outlined per CMS and USPSTF guidelines and agreed to schedule her next follow up appointment today.  Attestation Statements:   Reviewed by clinician on day of visit: allergies, medications, problem list, medical history, surgical history, family history, social history, and previous encounter notes.   I, Trixie Dredge, am acting as transcriptionist for Dennard Nip, MD.  I have reviewed the above documentation for accuracy and completeness, and I agree with the above. -  Dennard Nip, MD

## 2020-06-21 ENCOUNTER — Ambulatory Visit
Admission: RE | Admit: 2020-06-21 | Discharge: 2020-06-21 | Disposition: A | Discharge status: Home / Self Care | Payer: Medicare HMO | Source: Ambulatory Visit | Attending: Family Medicine | Admitting: Family Medicine

## 2020-06-21 ENCOUNTER — Other Ambulatory Visit: Payer: Self-pay

## 2020-06-21 DIAGNOSIS — M48061 Spinal stenosis, lumbar region without neurogenic claudication: Secondary | ICD-10-CM | POA: Diagnosis not present

## 2020-06-21 DIAGNOSIS — M5416 Radiculopathy, lumbar region: Secondary | ICD-10-CM

## 2020-06-21 MED ORDER — IOPAMIDOL (ISOVUE-M 200) INJECTION 41%
1.0000 mL | Freq: Once | INTRAMUSCULAR | Status: AC
  Administered 2020-06-21: 1 mL via EPIDURAL

## 2020-06-21 MED ORDER — METHYLPREDNISOLONE ACETATE 40 MG/ML INJ SUSP (RADIOLOG
80.0000 mg | Freq: Once | INTRAMUSCULAR | Status: AC
  Administered 2020-06-21: 80 mg via EPIDURAL

## 2020-06-21 NOTE — Discharge Instructions (Signed)
Post Procedure Spinal Discharge Instruction Sheet  1. You may resume a regular diet and any medications that you routinely take (including pain medications).  2. No driving day of procedure.  3. Light activity throughout the rest of the day.  Do not do any strenuous work, exercise, bending or lifting.  The day following the procedure, you can resume normal physical activity but you should refrain from exercising or physical therapy for at least three days thereafter.   Common Side Effects:   Headaches- take your usual medications as directed by your physician.  Increase your fluid intake.  Caffeinated beverages may be helpful.  Lie flat in bed until your headache resolves.   Restlessness or inability to sleep- you may have trouble sleeping for the next few days.  Ask your referring physician if you need any medication for sleep.   Facial flushing or redness- should subside within a few days.   Increased pain- a temporary increase in pain a day or two following your procedure is not unusual.  Take your pain medication as prescribed by your referring physician.   Leg cramps  Please contact our office at [phone] for the following symptoms:  Fever greater than 100 degrees.  Headaches unresolved with medication after 2-3 days.  Increased swelling, pain, or redness at injection site.  YOU MAY RESUME YOUR ASPRIN TODAY 06/21/20 AFTER PROCEDURE

## 2020-06-26 ENCOUNTER — Ambulatory Visit (INDEPENDENT_AMBULATORY_CARE_PROVIDER_SITE_OTHER): Payer: Medicare HMO | Admitting: Family Medicine

## 2020-06-26 ENCOUNTER — Encounter (INDEPENDENT_AMBULATORY_CARE_PROVIDER_SITE_OTHER): Payer: Self-pay | Admitting: Family Medicine

## 2020-06-26 ENCOUNTER — Other Ambulatory Visit: Payer: Self-pay

## 2020-06-26 VITALS — BP 122/64 | HR 44 | Temp 97.8°F | Ht 60.0 in | Wt 151.0 lb

## 2020-06-26 DIAGNOSIS — E669 Obesity, unspecified: Secondary | ICD-10-CM | POA: Diagnosis not present

## 2020-06-26 DIAGNOSIS — I10 Essential (primary) hypertension: Secondary | ICD-10-CM

## 2020-06-26 DIAGNOSIS — Z6830 Body mass index (BMI) 30.0-30.9, adult: Secondary | ICD-10-CM | POA: Diagnosis not present

## 2020-06-26 DIAGNOSIS — E559 Vitamin D deficiency, unspecified: Secondary | ICD-10-CM

## 2020-06-26 MED ORDER — VITAMIN D (ERGOCALCIFEROL) 1.25 MG (50000 UNIT) PO CAPS: 50000.0000 [IU] | ORAL_CAPSULE | ORAL | 0 refills | Status: DC

## 2020-06-26 NOTE — Progress Notes (Signed)
[facility] Phone: [phone] Subjective:   Courtney [name], am serving as a scribe for Dr. Hulan Saas. This visit occurred during the SARS-CoV-2 public health emergency.  Safety protocols were in place, including screening questions prior to the visit, additional usage of staff PPE, and extensive cleaning of exam room while observing appropriate contact time as indicated for disinfecting solutions.   I'm seeing this patient by the request  of:  Marrian Salvage, FNP  CC: Low back pain follow-up  HEN:[id number]   05/19/2020 Patient is having significant arthritic changes.  Discussed icing regimen and home exercises, discussed which activities to do which wants to avoid.  Patient will get x-rays today.  Patient is having severe pain that is stopping her from activity with some intermittent radicular symptoms and will consider the possibility of imaging at follow-up.  Patient has made some improvement overall.  Discussed which activities to do management significantly.  Patient is to continue the carbon fiber plate as well as the good shoes.  Avoid being barefoot for a while longer.  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  If the bone is well-healed and still has some effusion of the midfoot can consider the possibility of injections.  Follow-up with me again 6 weeks  Update 06/27/2020 Courtney [name] is a 70 y.o. female coming in with complaint of LBP and L foot pain. Patient states that by the end of the day her back is tired and tight. Epidural 06/21/2020.  Patient states that she is feeling 90% better with the back.  MRI did show moderate foraminal stenosis at multiple levels.  Patient did have a left-sided disc protrusion causing a left L4 nerve root impingement.  Moderate spinal stenosis at L4-L5 this was independently visualized by me again today.  Patient states that he is  having some very mild radicular pain down the right leg intermittently to the knee but never further.  Denies any weakness.  Using plates in shoes. Overall patient feels that she is 90% better.       Past Medical History:  Diagnosis Date  . Aortic valve sclerosis    echo 61/4431, soft systolic murmur  . 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 Back pain   . Carotid artery disease ([place])    40-59% bilateral, doppler December 2010  . Cataract   . Constipation   . Coronary artery disease    a. BMS-mid RCA 11/2007 b. stress echo 02/2009 subtle inferior HK, lateral ST depressions with stress c. follow-up cath 02/2009: RCA stent patent, severe but stable stenosis of distal posterior lateral branch RCA. LV normal, med tx unless more sx c. ETT Myoview 08/27/12: negative for ischemia; EF 64%  . Decreased hearing   . Depression    Mild, August, 2012  . Dyslipidemia   . Easy bruising   . Ejection fraction    EF normal, catheterization, 2011 /  EF 55%, echo, 2010  . Fluid overload    Mild, stable since hospitalization in the past  . GERD (gastroesophageal reflux disease)    occarionally, takes Pepcid over the counter  . Heart murmur    has, occasional PVC's  . History of right hip replacement   . Hyperlipidemia   . Hypertension   . Joint pain   . Myocardial infarction ([place])    2009  . Palpitations   . PVC (premature ventricular  contraction)    per prior Holter monitor  . Right hip pain 12/2009  . Shellfish allergy   . Stress fracture    Left foot  . Trouble in sleeping   . Vitamin B12 deficiency   . Vitamin D deficiency    Past Surgical History:  Procedure Laterality Date  . 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/01/2005  . BREAST BIOPSY Left   . carcinoid tumor removal     colon-  . CARDIAC CATHETERIZATION  1/211   Patent RCA stent, stenotic PLB supplying small distribution; medically managed  . CATARACT EXTRACTION, BILATERAL    . CORONARY ANGIOPLASTY WITH STENT PLACEMENT  11/2007   BMS-mid RCA  .  DIAGNOSTIC LAPAROSCOPY     1982 for endometriosis  . DILATION AND CURETTAGE OF UTERUS  2005  . SKIN CANCER EXCISION    . TOTAL HIP ARTHROPLASTY Right 07/08/2014   Procedure: RIGHT TOTAL HIP ARTHROPLASTY ANTERIOR APPROACH;  Surgeon: Mcarthur Rossetti, MD;  Location: [facility];  Service: Orthopedics;  Laterality: Right;  . WISDOM TOOTH EXTRACTION     Social History   Socioeconomic History  . Marital status: Divorced    Spouse name: Not on file  . Number of children: 1  . Years of education: 24  . Highest education level: Not on file  Occupational History  . Occupation: Retired - Retail buyer  Tobacco Use  . Smoking status: Never Smoker  . Smokeless tobacco: Never Used  Vaping Use  . Vaping Use: Never used  Substance and Sexual Activity  . Alcohol use: Yes    Comment: 1 glass of wine 3x/week  . Drug use: [name]  . Sexual activity: Yes  Other Topics Concern  . Not on file  Social History Narrative   Right handed   Soda- sometimes   Drinks herbal tea   Fun: Dance, garden,    Denies religious beliefs effecting health care.   Denies abuse and feels safe at home   Social Determinants of Health   Financial Resource Strain: Low Risk   . Difficulty of Paying Living Expenses: Not hard at all  Food Insecurity: [name] Food Insecurity  . Worried About Charity fundraiser in the Last Year: Never true  . Ran Out of Food in the Last Year: Never true  Transportation Needs: [name] Transportation Needs  . Lack of Transportation (Medical): [name]  . Lack of Transportation (Non-Medical): [name]  Physical Activity: Sufficiently Active  . Days of Exercise per Week: 5 days  . Minutes of Exercise per Session: 30 min  Stress: [name] Stress Concern Present  . Feeling of Stress : Not at all  Social Connections: Moderately Isolated  . Frequency of Communication with Friends and Family: More than three times a week  . Frequency of Social Gatherings with Friends and Family: More than three times a week  .  Attends Religious Services: Never  . Active Member of Clubs or Organizations: [name]  . Attends Archivist Meetings: Never  . Marital Status: Living with partner   Allergies  Allergen Reactions  . Erythromycin Hives  . Penicillins Hives    Has patient had a PCN reaction causing immediate rash, facial/tongue/throat swelling, SOB or lightheadedness with hypotension: [name] Has patient had a PCN reaction causing severe rash involving mucus membranes or skin necrosis: [name] Has patient had a PCN reaction that required hospitalization: [name] Has patient had a PCN reaction occurring within the last 10 years: [name] If all of the above answers are "[name]", then may  proceed with Cephalosporin use.   . Shellfish Allergy Hives   Family History  Problem Relation Age of Onset  . Heart attack Father 43  . Hyperlipidemia Father   . Hypertension Father   . Sudden death Father   . Stroke Mother   . Hyperlipidemia Mother   . Hypertension Mother   . Heart disease Mother   . Obesity Mother   . Heart attack Paternal Grandfather   . Heart disease Sister   . BRCA 1/2 Neg Hx   . Breast cancer Neg Hx   . Esophageal cancer Neg Hx   . Colon cancer Neg Hx   . Pancreatic cancer Neg Hx   . Liver disease Neg Hx   . Stomach cancer Neg Hx      Current Outpatient Medications (Cardiovascular):  .  ezetimibe (ZETIA) 10 MG tablet, TAKE 1 TABLET (10 MG TOTAL) BY MOUTH DAILY. .  metoprolol tartrate (LOPRESSOR) 25 MG tablet, Take 12.5 mg by mouth 2 (two) times daily. .  nitroGLYCERIN (NITROSTAT) 0.4 MG SL tablet, Place 1 tablet (0.4 mg total) under the tongue every 5 (five) minutes as needed for chest pain. .  ramipril (ALTACE) 10 MG capsule, Take 1 capsule (10 mg total) by mouth daily. .  rosuvastatin (CRESTOR) 40 MG tablet, Take 1 tablet (40 mg total) by mouth daily.   Current Outpatient Medications (Analgesics):  .  acetaminophen (TYLENOL) 500 MG tablet, Take 1,000 mg by mouth every 6 (six) hours as needed  (Pain). [name]  aspirin 81 MG tablet, Take 81 mg by mouth daily.   Current Outpatient Medications (Other):  .  dicyclomine (BENTYL) 20 MG tablet, Take 1 tablet (20 mg total) by mouth every 6 (six) hours as needed for spasms. [name]  gabapentin (NEURONTIN) 300 MG capsule, Take 1 capsule (300 mg total) by mouth at bedtime. .  melatonin 5 MG TABS, Take 5 mg by mouth at bedtime as needed. .  pantoprazole (PROTONIX) 40 MG tablet, Take 1 tablet (40 mg total) by mouth daily. .  Passion Flower-Valerian 500-500 MG CAPS, Take 1 capsule by mouth as needed. .  traZODone (DESYREL) 50 MG tablet, Take 0.5-1 tablets (25-50 mg total) by mouth at bedtime. .  Vitamin D, Ergocalciferol, (DRISDOL) 1.25 MG (50000 UNIT) CAPS capsule, Take 1 capsule (50,000 Units total) by mouth every 7 (seven) days.   Reviewed prior external information including notes and imaging from  primary care provider As well as notes that were available from care everywhere and other healthcare systems.  Past medical history, social, surgical and family history all reviewed in electronic medical record.  [name] pertanent information unless stated regarding to the chief complaint.   Review of Systems:  [name] headache, visual changes, nausea, vomiting, diarrhea, constipation, dizziness, abdominal pain, skin rash, fevers, chills, night sweats, weight loss, swollen lymph nodes, joint swelling, chest pain, shortness of breath, mood changes. POSITIVE muscle aches, body aches  Objective  Blood pressure 124/82, pulse (!) 49, height 5' (1.524 m), weight 154 lb (69.9 kg), SpO2 97 %.   General: [name] apparent distress alert and oriented x3 mood and affect normal, dressed appropriately.  HEENT: Pupils equal, extraocular movements intact  Respiratory: Patient's speak in full sentences and does not appear short of breath  Cardiovascular: [name] lower extremity edema, non tender, [name] erythema  Gait normal with good balance and coordination.  MSK:   Low back exam does have  some mild loss of lordosis.  Patient does have tightness noted in the paraspinal musculature  right greater than left.  Patient does seem to be comfortable though sitting in the chair.  Left foot exam does have a rigid midfoot noted.  Nontender on exam.  [name] significant swelling noted.  [name] pain over the fifth metatarsal head.  Limited musculoskeletal ultrasound was performed and interpreted by Lyndal Pulley  Limited ultrasound shows the patient does have moderate midfoot arthritis noted.  [name] significant effusion noted but patient does have possible bursitis noted of the midfoot.  [name] significant cortical irregularity noted at this point.  Bone seems to have full reabsorption at this point. Impression: Midfoot arthritis but [name] findings consistent with the stress fracture    Impression and Recommendations:      The above documentation has been reviewed and is accurate and complete Lyndal Pulley, DO

## 2020-06-27 ENCOUNTER — Ambulatory Visit: Payer: Self-pay

## 2020-06-27 ENCOUNTER — Encounter: Payer: Self-pay | Admitting: Family Medicine

## 2020-06-27 ENCOUNTER — Ambulatory Visit: Payer: Medicare HMO | Admitting: Family Medicine

## 2020-06-27 VITALS — BP 124/82 | HR 49 | Ht 60.0 in | Wt 154.0 lb

## 2020-06-27 DIAGNOSIS — M79672 Pain in left foot: Secondary | ICD-10-CM | POA: Diagnosis not present

## 2020-06-27 DIAGNOSIS — M84375D Stress fracture, left foot, subsequent encounter for fracture with routine healing: Secondary | ICD-10-CM

## 2020-06-27 DIAGNOSIS — M5136 Other intervertebral disc degeneration, lumbar region: Secondary | ICD-10-CM | POA: Diagnosis not present

## 2020-06-27 DIAGNOSIS — M51369 Other intervertebral disc degeneration, lumbar region without mention of lumbar back pain or lower extremity pain: Secondary | ICD-10-CM

## 2020-06-27 MED ORDER — VITAMIN D (ERGOCALCIFEROL) 1.25 MG (50000 UNIT) PO CAPS: 50000.0000 [IU] | ORAL_CAPSULE | ORAL | 0 refills | Status: DC

## 2020-06-27 NOTE — Patient Instructions (Signed)
HOKA or OOFOS recovery sandals in the house Try other shoes out of house IF worsening pain go back to carbon plate Take K2 until end of month then discontinue Can order another epidural if needed See me again in 2 months

## 2020-06-27 NOTE — Assessment & Plan Note (Signed)
Patient on exam today seems to be doing significantly better.  On ultrasound do not see any of the cortical defect noted at the moment.  Discussed with patient about the possibility of different other shoes that I think will be beneficial and rocker-bottom shoes being possibly helpful in the long run.  This may also help her lumbar spine.  Patient will make these changes continue the conservative therapy follow-up with me again 2 months

## 2020-06-27 NOTE — Assessment & Plan Note (Signed)
Patient on MRI does have significant nerve root impingement in the spinal stenosis as well as neuroforaminal stenosis.  Patient did respond extremely well though to the epidural.  Feels like she is feeling approximately 90% better with her back as well.  Has started working out in the aquatic pool more than anything else.  Discussed with patient to continue the range of motion exercises.  No significant change in medication but does have the gabapentin.  Can repeat injections if necessary.  Would like to avoid surgical intervention completely if possible.  Follow-up with me again in 2 months

## 2020-06-27 NOTE — Progress Notes (Signed)
Chief Complaint:   OBESITY Courtney [name] is here to discuss her progress with her obesity treatment plan along with follow-up of her obesity related diagnoses. Courtney [name] is on the Category 1 Plan and states Courtney [name] is following her eating plan approximately 75% of the time. Courtney [name] states Courtney [name] is doing water workout for 45 minutes 3 times per week.  Today's visit was #: 46 Starting weight: 157 lbs Starting date: 06/16/2017 Today's weight: 151 lbs Today's date: 06/26/2020 Total lbs lost to date: 6 Total lbs lost since last in-office visit: 0  Interim History: Eman has done some celebration eating recently, an Courtney [name] will be traveling this week as well. Courtney [name] has tried to be mindful but Courtney [name] is still doing some celebration eating. Courtney [name] is open to travel strategies for her nutrition.  Subjective:   1. Vitamin D deficiency Courtney [name] is stable on Vit D, and Courtney [name] denies nausea, vomiting, or muscle weakness. Courtney [name] requests a refill today.  2. Essential hypertension Courtney [name]'s blood pressure is well controlled on her medications. Courtney [name] denies signs of hypotension. Courtney [name] continues to work on diet and weight loss to help control her blood pressure and decrease risk of heart disease.  Assessment/Plan:   1. Vitamin D deficiency Low Vitamin D level contributes to fatigue and are associated with obesity, breast, and colon cancer. We will refill prescription Vitamin D for 1 month. Courtney [name] will follow-up for routine testing of Vitamin D, at least 2-3 times per year to avoid over-replacement.  - Vitamin D, Ergocalciferol, (DRISDOL) 1.25 MG (50000 UNIT) CAPS capsule; Take 1 capsule (50,000 Units total) by mouth every 7 (seven) days.  Dispense: 4 capsule; Refill: 0  2. Essential hypertension Courtney [name] will continue with diet and exercise, and will continue to monitor as Courtney [name] continues her lifestyle modifications.  3. Obesity with current BMI 29.5 Courtney [name] is currently in the action stage of change. As such, her goal is to continue with weight  loss efforts. Courtney [name] has agreed to the Category 1 Plan.   Exercise goals: As is.  Behavioral modification strategies: better snacking choices, travel eating strategies and holiday eating strategies .  Courtney [name] has agreed to follow-up with our clinic in 4 weeks. Courtney [name] was informed of the importance of frequent follow-up visits to maximize her success with intensive lifestyle modifications for her multiple health conditions.   Objective:   Blood pressure 122/64, pulse (!) 44, temperature 97.8 F (36.6 C), height 5' (1.524 m), weight 151 lb (68.5 kg), SpO2 99 %. Body mass index is 29.49 kg/m.  General: Cooperative, alert, well developed, in no acute distress. HEENT: Conjunctivae and lids unremarkable. Cardiovascular: Regular rhythm.  Lungs: Normal work of breathing. Neurologic: No focal deficits.   Lab Results  Component Value Date   CREATININE 1.03 (H) 03/30/2020   BUN 26 03/30/2020   NA 140 03/30/2020   K 4.8 03/30/2020   CL 105 03/30/2020   CO2 22 03/30/2020   Lab Results  Component Value Date   ALT 19 03/30/2020   AST 22 03/30/2020   ALKPHOS 66 03/30/2020   BILITOT 0.2 03/30/2020   Lab Results  Component Value Date   HGBA1C 5.8 (H) 03/30/2020   HGBA1C 5.6 12/01/2019   HGBA1C 5.7 (H) 08/04/2019   HGBA1C 5.4 09/14/2018   HGBA1C 5.6 03/03/2018   Lab Results  Component Value Date   INSULIN 9.4 03/30/2020   INSULIN 7.2 12/01/2019   INSULIN 4.9 08/04/2019   INSULIN 4.2 09/14/2018   INSULIN 5.0 03/03/2018   Lab Results  Component Value Date   TSH 1.75 01/03/2020   Lab Results  Component Value Date   CHOL 173 03/30/2020   HDL 67 03/30/2020   LDLCALC 91 03/30/2020   TRIG 84 03/30/2020   CHOLHDL 2.6 03/30/2020   Lab Results  Component Value Date   WBC 7.3 01/03/2020   HGB 13.0 01/03/2020   HCT 37.7 01/03/2020   MCV 91.5 01/03/2020   PLT 196.0 01/03/2020   Lab Results  Component Value Date   IRON 114 01/03/2020   TIBC 328 01/25/2019   FERRITIN 54 01/25/2019     Obesity Behavioral Intervention:   Approximately 15 minutes were spent on the discussion below.  ASK: We discussed the diagnosis of obesity with Courtney [name] today and Courtney [name] agreed to give [place] permission to discuss obesity behavioral modification therapy today.  ASSESS: Courtney [name] has the diagnosis of obesity and her BMI today is 29.49. Courtney [name] is in the action stage of change.   ADVISE: Courtney [name] was educated on the multiple health risks of obesity as well as the benefit of weight loss to improve her health. Courtney [name] was advised of the need for long term treatment and the importance of lifestyle modifications to improve her current health and to decrease her risk of future health problems.  AGREE: Multiple dietary modification options and treatment options were discussed and Courtney [name] agreed to follow the recommendations documented in the above note.  ARRANGE: Courtney [name] was educated on the importance of frequent visits to treat obesity as outlined per CMS and USPSTF guidelines and agreed to schedule her next follow up appointment today.  Attestation Statements:   Reviewed by clinician on day of visit: allergies, medications, problem list, medical history, surgical history, family history, social history, and previous encounter notes.   I, Trixie Dredge, am acting as transcriptionist for Dennard Nip, MD.  I have reviewed the above documentation for accuracy and completeness, and I agree with the above. -  Dennard Nip, MD

## 2020-06-28 ENCOUNTER — Ambulatory Visit (INDEPENDENT_AMBULATORY_CARE_PROVIDER_SITE_OTHER): Payer: Medicare HMO | Admitting: Dermatology

## 2020-06-28 ENCOUNTER — Encounter: Payer: Self-pay | Admitting: Dermatology

## 2020-06-28 ENCOUNTER — Other Ambulatory Visit: Payer: Self-pay

## 2020-06-28 DIAGNOSIS — Z1283 Encounter for screening for malignant neoplasm of skin: Secondary | ICD-10-CM

## 2020-06-28 DIAGNOSIS — L738 Other specified follicular disorders: Secondary | ICD-10-CM | POA: Diagnosis not present

## 2020-06-28 DIAGNOSIS — D1801 Hemangioma of skin and subcutaneous tissue: Secondary | ICD-10-CM

## 2020-06-28 DIAGNOSIS — L905 Scar conditions and fibrosis of skin: Secondary | ICD-10-CM

## 2020-06-28 DIAGNOSIS — Z85828 Personal history of other malignant neoplasm of skin: Secondary | ICD-10-CM | POA: Diagnosis not present

## 2020-06-28 DIAGNOSIS — L821 Other seborrheic keratosis: Secondary | ICD-10-CM | POA: Diagnosis not present

## 2020-07-11 ENCOUNTER — Encounter: Payer: Self-pay | Admitting: Dermatology

## 2020-07-11 NOTE — Progress Notes (Signed)
   Follow-Up Visit   Subjective  Courtney [name] is a 70 y.o. female who presents for the following: Annual Exam (Full body ).  General skin examination Location:  Duration:  Quality:  Associated Signs/Symptoms: Modifying Factors:  Severity:  Timing: Context:   Objective  Well appearing patient in no apparent distress; mood and affect are within normal limits. Objective  Left Inguinal Area: Annual skin examination, no atypical pigmented lesions.  Objective  Left Forehead: No recurrence  Objective  Head - Anterior (Face): Two millimeter delled flesh-colored papules, typical dermoscopy  Objective  Right Breast, Right Upper Arm - Anterior: 2 mm smooth red papules  Objective  Right Temple, Right Upper Arm - Anterior: Textured 4 to 5 mm flattopped brown papules    A full examination was performed including scalp, head, eyes, ears, nose, lips, neck, chest, axillae, abdomen, back, buttocks, bilateral upper extremities, bilateral lower extremities, hands, feet, fingers, toes, fingernails, and toenails. All findings within normal limits unless otherwise noted below.  Areas beneath undergarments not fully examined.   Assessment & Plan    Screening exam for skin cancer Left Inguinal Area  Annual skin examination, encouraged to self examine twice annually.  Continued ultraviolet protection.  History of basal cell carcinoma (BCC) Left Forehead  Recheck as needed  Sebaceous hyperplasia of face Head - Anterior (Face)  Told of resemblance to early [facility] so if there is growth or bleeding return for biopsy  Cherry angioma (2) Right Upper Arm - Anterior; Right Breast  No intervention necessary  Seborrheic keratosis (2) Right Upper Arm - Anterior; Right Temple  Leave if stable      I, Lavonna Monarch, MD, have reviewed all documentation for this visit.  The documentation on 07/11/20 for the exam, diagnosis, procedures, and orders are all accurate and complete.

## 2020-07-11 NOTE — Addendum Note (Signed)
Addended by: Lavonna Monarch on: 07/11/2020 06:49 AM   Modules accepted: Level of Service

## 2020-07-20 ENCOUNTER — Other Ambulatory Visit: Payer: Self-pay | Admitting: Cardiology

## 2020-07-27 ENCOUNTER — Encounter (INDEPENDENT_AMBULATORY_CARE_PROVIDER_SITE_OTHER): Payer: Self-pay | Admitting: Family Medicine

## 2020-07-27 ENCOUNTER — Other Ambulatory Visit: Payer: Self-pay

## 2020-07-27 ENCOUNTER — Ambulatory Visit (INDEPENDENT_AMBULATORY_CARE_PROVIDER_SITE_OTHER): Payer: Medicare HMO | Admitting: Family Medicine

## 2020-07-27 VITALS — BP 107/68 | HR 50 | Temp 98.0°F | Ht 60.0 in | Wt 151.0 lb

## 2020-07-27 DIAGNOSIS — Z6830 Body mass index (BMI) 30.0-30.9, adult: Secondary | ICD-10-CM | POA: Diagnosis not present

## 2020-07-27 DIAGNOSIS — E559 Vitamin D deficiency, unspecified: Secondary | ICD-10-CM | POA: Diagnosis not present

## 2020-07-27 DIAGNOSIS — E669 Obesity, unspecified: Secondary | ICD-10-CM

## 2020-07-27 DIAGNOSIS — R7303 Prediabetes: Secondary | ICD-10-CM | POA: Diagnosis not present

## 2020-07-27 MED ORDER — VITAMIN D (ERGOCALCIFEROL) 1.25 MG (50000 UNIT) PO CAPS: 50000.0000 [IU] | ORAL_CAPSULE | ORAL | 0 refills | Status: DC

## 2020-08-03 NOTE — Progress Notes (Signed)
Chief Complaint:   OBESITY Courtney [name] is here to discuss her progress with her obesity treatment plan along with follow-up of her obesity related diagnoses. Courtney [name] is on the Category 1 Plan and states she is following her eating plan approximately 75% of the time. Courtney [name] states she is doing water aerobics for 45 minutes 3 times per week.  Today's visit was #: 65 Starting weight: 157 lbs Starting date: 06/16/2017 Today's weight: 151 lbs Today's date: 07/27/2020 Total lbs lost to date: 6 Total lbs lost since last in-office visit: 0  Interim History: Courtney [name] has done well maintaining her weight even with multiple vacations. She has increased her activity with water exercises. She is working on [employer] protein. She is staying active in her free time which helps her decrease boredom eating.  Subjective:   1. Vitamin D deficiency Courtney [name] is stable on Vit D, and she denies signs of over-replacement.  2. Pre-diabetes Courtney [name] is working on diet, exercise, and weight loss. She denies signs of hypoglycemia.  Assessment/Plan:   1. Vitamin D deficiency Low Vitamin D level contributes to fatigue and are associated with obesity, breast, and colon cancer. We will refill prescription Vitamin D for 1 month, and we will recheck labs in 1 month. Courtney [name] will follow-up for routine testing of Vitamin D, at least 2-3 times per year to avoid over-replacement.  - Vitamin D, Ergocalciferol, (DRISDOL) 1.25 MG (50000 UNIT) CAPS capsule; Take 1 capsule (50,000 Units total) by mouth every 7 (seven) days.  Dispense: 4 capsule; Refill: 0  2. Pre-diabetes Courtney [name] will continue diet, exercise, and weight loss, and decreasing simple carbohydrates to help decrease the risk of diabetes. We will recheck labs in 1 month.  3. Obesity with current BMI 29.6 Courtney [name] is currently in the action stage of change. As such, her goal is to continue with weight loss efforts. She has agreed to the Category 1 Plan.   We will recheck  fasting labs at her next visit.  Exercise goals: As is.  Behavioral modification strategies: increasing lean protein intake.  Courtney [name] has agreed to follow-up with our clinic in 4 weeks. She was informed of the importance of frequent follow-up visits to maximize her success with intensive lifestyle modifications for her multiple health conditions.   Objective:   Blood pressure 107/68, pulse (!) 50, temperature 98 F (36.7 C), height 5' (1.524 m), weight 151 lb (68.5 kg), SpO2 96 %. Body mass index is 29.49 kg/m.  General: Cooperative, alert, well developed, in no acute distress. HEENT: Conjunctivae and lids unremarkable. Cardiovascular: Regular rhythm.  Lungs: Normal work of breathing. Neurologic: No focal deficits.   Lab Results  Component Value Date   CREATININE 1.03 (H) 03/30/2020   BUN 26 03/30/2020   NA 140 03/30/2020   K 4.8 03/30/2020   CL 105 03/30/2020   CO2 22 03/30/2020   Lab Results  Component Value Date   ALT 19 03/30/2020   AST 22 03/30/2020   ALKPHOS 66 03/30/2020   BILITOT 0.2 03/30/2020   Lab Results  Component Value Date   HGBA1C 5.8 (H) 03/30/2020   HGBA1C 5.6 12/01/2019   HGBA1C 5.7 (H) 08/04/2019   HGBA1C 5.4 09/14/2018   HGBA1C 5.6 03/03/2018   Lab Results  Component Value Date   INSULIN 9.4 03/30/2020   INSULIN 7.2 12/01/2019   INSULIN 4.9 08/04/2019   INSULIN 4.2 09/14/2018   INSULIN 5.0 03/03/2018   Lab Results  Component Value Date   TSH 1.75 01/03/2020  Lab Results  Component Value Date   CHOL 173 03/30/2020   HDL 67 03/30/2020   LDLCALC 91 03/30/2020   TRIG 84 03/30/2020   CHOLHDL 2.6 03/30/2020   Lab Results  Component Value Date   WBC 7.3 01/03/2020   HGB 13.0 01/03/2020   HCT 37.7 01/03/2020   MCV 91.5 01/03/2020   PLT 196.0 01/03/2020   Lab Results  Component Value Date   IRON 114 01/03/2020   TIBC 328 01/25/2019   FERRITIN 54 01/25/2019    Obesity Behavioral Intervention:   Approximately 15 minutes were  spent on the discussion below.  ASK: We discussed the diagnosis of obesity with Courtney [name] today and Courtney [name] agreed to give [place] permission to discuss obesity behavioral modification therapy today.  ASSESS: Courtney [name] has the diagnosis of obesity and her BMI today is 29.49. Courtney [name] is in the action stage of change.   ADVISE: Courtney [name] was educated on the multiple health risks of obesity as well as the benefit of weight loss to improve her health. She was advised of the need for long term treatment and the importance of lifestyle modifications to improve her current health and to decrease her risk of future health problems.  AGREE: Multiple dietary modification options and treatment options were discussed and Courtney [name] agreed to follow the recommendations documented in the above note.  ARRANGE: Courtney [name] was educated on the importance of frequent visits to treat obesity as outlined per CMS and USPSTF guidelines and agreed to schedule her next follow up appointment today.  Attestation Statements:   Reviewed by clinician on day of visit: allergies, medications, problem list, medical history, surgical history, family history, social history, and previous encounter notes.   I, Trixie Dredge, am acting as transcriptionist for Dennard Nip, MD.  I have reviewed the above documentation for accuracy and completeness, and I agree with the above. -  Dennard Nip, MD

## 2020-08-24 NOTE — Progress Notes (Signed)
[place] Phone: [phone] Subjective:   Fontaine No, am serving as a scribe for Dr. Hulan Saas.  This visit occurred during the SARS-CoV-2 public health emergency.  Safety protocols were in place, including screening questions prior to the visit, additional usage of staff PPE, and extensive cleaning of exam room while observing appropriate contact time as indicated for disinfecting solutions.    I'm seeing this patient by the request  of:  Marrian Salvage, FNP  CC: Left foot pain, low back pain follow-up  JIR:[id number]  06/27/2020 Patient on exam today seems to be doing significantly better.  On ultrasound do not see any of the cortical defect noted at the moment.  Discussed with patient about the possibility of different other shoes that I think will be beneficial and rocker-bottom shoes being possibly helpful in the long run.  This may also help her lumbar spine.  Patient will make these changes continue the conservative therapy follow-up with me again 2 months  Patient on MRI does have significant nerve root impingement in the spinal stenosis as well as neuroforaminal stenosis.  Patient did respond extremely well though to the epidural.  Feels like she is feeling approximately 90% better with her back as well.  Has started working out in the aquatic pool more than anything else.  Discussed with patient to continue the range of motion exercises.  No significant change in medication but does have the gabapentin.  Can repeat injections if necessary.  Would like to avoid surgical intervention completely if possible.  Follow-up with me again in 2 months  Update 08/31/2020 KIMA MALENFANT is a 70 y.o. female coming in with complaint of L foot pain. Patient states that she is doing much better. Pain increases with weight bearing on hard surfaces.  Patient states this morning she went her some type of shoes she  seems to be better.  Back pain has improved. Epidural on May 4th and this lasted about 3 weeks. Using TENS unit and Emu oil for pain relief.  Patient states that he seems to be very reasonable at this time.     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 BMS-mid RCA 11/2007 b. stress echo 02/2009 subtle inferior HK, lateral ST depressions with stress c. follow-up cath 02/2009: RCA stent patent, severe but stable stenosis of distal posterior lateral branch RCA. LV normal, med tx unless more sx c. ETT Myoview 08/27/12: negative for ischemia; EF 64%   Decreased hearing    Depression    Mild, August, 2012   Dyslipidemia    Easy bruising    Ejection fraction    EF normal, catheterization, 2011 /  EF 55%, echo, 2010   Fluid overload    Mild, stable since hospitalization in the past   GERD (gastroesophageal reflux disease)    occarionally, takes Pepcid over the counter   Heart murmur    has, occasional PVC's   History of right hip replacement    Hyperlipidemia    Hypertension    Joint  pain    Myocardial infarction ([place])    2009   Palpitations    PVC (premature ventricular contraction)    per prior Holter monitor   Right hip pain 12/2009   Shellfish allergy    Stress fracture    Left foot   Trouble in sleeping    Vitamin B12 deficiency    Vitamin D deficiency    Past Surgical History:  Procedure Laterality Date   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/01/2005   BREAST BIOPSY Left    carcinoid tumor removal     colon-   CARDIAC CATHETERIZATION  1/211   Patent RCA stent, stenotic PLB supplying small distribution; medically managed   CATARACT EXTRACTION, BILATERAL     CORONARY ANGIOPLASTY WITH  STENT PLACEMENT  11/2007   BMS-mid RCA   DIAGNOSTIC LAPAROSCOPY     1982 for endometriosis   DILATION AND CURETTAGE OF UTERUS  2005   SKIN CANCER EXCISION     TOTAL HIP ARTHROPLASTY Right 07/08/2014   Procedure: RIGHT TOTAL HIP ARTHROPLASTY ANTERIOR APPROACH;  Surgeon: Mcarthur Rossetti, MD;  Location: [facility];  Service: Orthopedics;  Laterality: Right;   WISDOM TOOTH EXTRACTION     Social History   Socioeconomic History   Marital status: Divorced    Spouse name: Not on file   Number of children: 1   Years of education: 12   Highest education level: Not on file  Occupational History   Occupation: Retired - Retail buyer  Tobacco Use   Smoking status: Never   Smokeless tobacco: Never  Vaping Use   Vaping Use: Never used  Substance and Sexual Activity   Alcohol use: Yes    Comment: 1 glass of wine 3x/week   Drug use: No   Sexual activity: Yes  Other Topics Concern   Not on file  Social History Narrative   Right handed   Soda- sometimes   Drinks herbal tea   Fun: Dance, garden,    Denies religious beliefs effecting health care.   Denies abuse and feels safe at home   Social Determinants of Health   Financial Resource Strain: Low Risk    Difficulty of Paying Living Expenses: Not hard at all  Food Insecurity: No Food Insecurity   Worried About Charity fundraiser in the Last Year: Never true   [place] in the Last Year: Never true  Transportation Needs: No Transportation Needs   Lack of Transportation (Medical): No   Lack of Transportation (Non-Medical): No  Physical Activity: Sufficiently Active   Days of Exercise per Week: 5 days   Minutes of Exercise per Session: 30 min  Stress: No Stress Concern Present   Feeling of Stress : Not at all  Social Connections: Moderately Isolated   Frequency of Communication with Friends and Family: More than three times a week   Frequency of Social Gatherings with Friends and Family: More than three times a week    Attends Religious Services: Never   Marine scientist or Organizations: No   Attends Music therapist: Never   Marital Status: Living with partner   Allergies  Allergen Reactions   Erythromycin Hives   Penicillins Hives    Has patient had a PCN reaction causing immediate rash, facial/tongue/throat swelling, SOB or lightheadedness with hypotension: No Has patient had a PCN reaction causing severe rash involving mucus membranes or skin necrosis: No Has patient had a PCN reaction that required hospitalization: No Has patient had a PCN  reaction occurring within the last 10 years: No If all of the above answers are "NO", then may proceed with Cephalosporin use.    Shellfish Allergy Hives   Family History  Problem Relation Age of Onset   Heart attack Father 62   Hyperlipidemia Father    Hypertension Father    Sudden death Father    Stroke Mother    Hyperlipidemia Mother    Hypertension Mother    Heart disease Mother    Obesity Mother    Heart attack Paternal Grandfather    Heart disease Sister    BRCA 1/2 Neg Hx    Breast cancer Neg Hx    Esophageal cancer Neg Hx    Colon cancer Neg Hx    Pancreatic cancer Neg Hx    Liver disease Neg Hx    Stomach cancer Neg Hx      Current Outpatient Medications (Cardiovascular):    ezetimibe (ZETIA) 10 MG tablet, TAKE 1 TABLET (10 MG TOTAL) BY MOUTH DAILY.   metoprolol tartrate (LOPRESSOR) 25 MG tablet, Take 12.5 mg by mouth 2 (two) times daily.   nitroGLYCERIN (NITROSTAT) 0.4 MG SL tablet, Place 1 tablet (0.4 mg total) under the tongue every 5 (five) minutes as needed for chest pain.   ramipril (ALTACE) 10 MG capsule, TAKE 1 CAPSULE EVERY DAY   rosuvastatin (CRESTOR) 40 MG tablet, Take 1 tablet (40 mg total) by mouth daily.   Current Outpatient Medications (Analgesics):    acetaminophen (TYLENOL) 500 MG tablet, Take 1,000 mg by mouth every 6 (six) hours as needed (Pain).   aspirin 81 MG tablet, Take 81 mg by mouth  daily.   meloxicam (MOBIC) 15 MG tablet, Take 1 tablet (15 mg total) by mouth daily.   Current Outpatient Medications (Other):    dicyclomine (BENTYL) 20 MG tablet, Take 1 tablet (20 mg total) by mouth every 6 (six) hours as needed for spasms.   gabapentin (NEURONTIN) 300 MG capsule, Take 1 capsule (300 mg total) by mouth at bedtime.   melatonin 5 MG TABS, Take 5 mg by mouth at bedtime as needed.   pantoprazole (PROTONIX) 40 MG tablet, Take 1 tablet (40 mg total) by mouth daily.   Passion Flower-Valerian 500-500 MG CAPS, Take 1 capsule by mouth as needed.   Vitamin D, Ergocalciferol, (DRISDOL) 1.25 MG (50000 UNIT) CAPS capsule, Take 1 capsule (50,000 Units total) by mouth every 7 (seven) days.   Reviewed prior external information including notes and imaging from  primary care provider As well as notes that were available from care everywhere and other healthcare systems.  Past medical history, social, surgical and family history all reviewed in electronic medical record.  No pertanent information unless stated regarding to the chief complaint.   Review of Systems:  No headache, visual changes, nausea, vomiting, diarrhea, constipation, dizziness, abdominal pain, skin rash, fevers, chills, night sweats, weight loss, swollen lymph nodes, body aches, joint swelling, chest pain, shortness of breath, mood changes. POSITIVE muscle aches  Objective  Blood pressure 118/72, pulse 61, height 5' (1.524 m), weight 157 lb (71.2 kg), SpO2 97 %.   General: No apparent distress alert and oriented x3 mood and affect normal, dressed appropriately.  HEENT: Pupils equal, extraocular movements intact  Respiratory: Patient's speak in full sentences and does not appear short of breath  Cardiovascular: No lower extremity edema, non tender, no erythema  Gait normal with good balance and coordination.  MSK:   Left foot exam still shows the patient does have  breakdown of the transverse arch.  Nontender on exam.   Rigid midfoot noted. Low back exam does have tightness with straight leg test.  Neurovascular intact distally.  Patient able to ambulate relatively well.  Limited musculoskeletal ultrasound was performed and interpreted by Lyndal Pulley  Limited ultrasound of patient's left foot shows the patient does have moderate arthritic changes of the midfoot noted.  No cortical irregularities are noted with patient previously had difficulty.  Otherwise fairly unremarkable. Impression: Mild midfoot arthritis   Impression and Recommendations:     The above documentation has been reviewed and is accurate and complete Lyndal Pulley, DO

## 2020-08-28 DIAGNOSIS — R102 Pelvic and perineal pain: Secondary | ICD-10-CM | POA: Diagnosis not present

## 2020-08-28 DIAGNOSIS — R3129 Other microscopic hematuria: Secondary | ICD-10-CM | POA: Diagnosis not present

## 2020-08-29 DIAGNOSIS — M545 Low back pain, unspecified: Secondary | ICD-10-CM | POA: Diagnosis not present

## 2020-08-29 DIAGNOSIS — R3129 Other microscopic hematuria: Secondary | ICD-10-CM | POA: Diagnosis not present

## 2020-08-29 DIAGNOSIS — K5900 Constipation, unspecified: Secondary | ICD-10-CM | POA: Diagnosis not present

## 2020-08-29 DIAGNOSIS — R102 Pelvic and perineal pain: Secondary | ICD-10-CM | POA: Diagnosis not present

## 2020-08-30 ENCOUNTER — Encounter (INDEPENDENT_AMBULATORY_CARE_PROVIDER_SITE_OTHER): Payer: Self-pay | Admitting: Family Medicine

## 2020-08-30 ENCOUNTER — Ambulatory Visit (INDEPENDENT_AMBULATORY_CARE_PROVIDER_SITE_OTHER): Payer: Medicare HMO | Admitting: Family Medicine

## 2020-08-30 ENCOUNTER — Other Ambulatory Visit: Payer: Self-pay

## 2020-08-30 VITALS — BP 116/65 | HR 46 | Temp 97.8°F | Ht 60.0 in | Wt 154.0 lb

## 2020-08-30 DIAGNOSIS — Z6830 Body mass index (BMI) 30.0-30.9, adult: Secondary | ICD-10-CM

## 2020-08-30 DIAGNOSIS — R7303 Prediabetes: Secondary | ICD-10-CM | POA: Diagnosis not present

## 2020-08-30 DIAGNOSIS — E782 Mixed hyperlipidemia: Secondary | ICD-10-CM | POA: Diagnosis not present

## 2020-08-30 DIAGNOSIS — E559 Vitamin D deficiency, unspecified: Secondary | ICD-10-CM

## 2020-08-30 DIAGNOSIS — E669 Obesity, unspecified: Secondary | ICD-10-CM | POA: Diagnosis not present

## 2020-08-30 MED ORDER — VITAMIN D (ERGOCALCIFEROL) 1.25 MG (50000 UNIT) PO CAPS: 50000.0000 [IU] | ORAL_CAPSULE | ORAL | 0 refills | Status: DC

## 2020-08-31 ENCOUNTER — Ambulatory Visit: Payer: Medicare HMO | Admitting: Family Medicine

## 2020-08-31 ENCOUNTER — Encounter: Payer: Self-pay | Admitting: Family Medicine

## 2020-08-31 ENCOUNTER — Ambulatory Visit: Payer: Self-pay

## 2020-08-31 VITALS — BP 118/72 | HR 61 | Ht 60.0 in | Wt 157.0 lb

## 2020-08-31 DIAGNOSIS — M5136 Other intervertebral disc degeneration, lumbar region: Secondary | ICD-10-CM | POA: Diagnosis not present

## 2020-08-31 DIAGNOSIS — M84375D Stress fracture, left foot, subsequent encounter for fracture with routine healing: Secondary | ICD-10-CM | POA: Diagnosis not present

## 2020-08-31 DIAGNOSIS — M79672 Pain in left foot: Secondary | ICD-10-CM

## 2020-08-31 DIAGNOSIS — M5416 Radiculopathy, lumbar region: Secondary | ICD-10-CM | POA: Diagnosis not present

## 2020-08-31 LAB — HEMOGLOBIN A1C
Est. average glucose Bld gHb Est-mCnc: 120 mg/dL
Hgb A1c MFr Bld: 5.8 % — ABNORMAL HIGH (ref 4.8–5.6)

## 2020-08-31 LAB — CMP14+EGFR
ALT: 21 IU/L (ref 0–32)
AST: 23 IU/L (ref 0–40)
Albumin/Globulin Ratio: 1.8 (ref 1.2–2.2)
Albumin: 4.4 g/dL (ref 3.7–4.7)
Alkaline Phosphatase: 77 IU/L (ref 44–121)
BUN/Creatinine Ratio: 30 — ABNORMAL HIGH (ref 12–28)
BUN: 31 mg/dL — ABNORMAL HIGH (ref 8–27)
Bilirubin Total: 0.3 mg/dL (ref 0.0–1.2)
CO2: 22 mmol/L (ref 20–29)
Calcium: 9.5 mg/dL (ref 8.7–10.3)
Chloride: 99 mmol/L (ref 96–106)
Creatinine, Ser: 1.04 mg/dL — ABNORMAL HIGH (ref 0.57–1.00)
Globulin, Total: 2.4 g/dL (ref 1.5–4.5)
Glucose: 87 mg/dL (ref 65–99)
Potassium: 4.6 mmol/L (ref 3.5–5.2)
Sodium: 139 mmol/L (ref 134–144)
Total Protein: 6.8 g/dL (ref 6.0–8.5)
eGFR: 57 mL/min/{1.73_m2} — ABNORMAL LOW (ref >59)

## 2020-08-31 LAB — LIPID PANEL WITH LDL/HDL RATIO
Cholesterol, Total: 164 mg/dL (ref 100–199)
HDL: 62 mg/dL (ref >39)
LDL Chol Calc (NIH): 86 mg/dL (ref 0–99)
LDL/HDL Ratio: 1.4 ratio (ref 0.0–3.2)
Triglycerides: 84 mg/dL (ref 0–149)
VLDL Cholesterol Cal: 16 mg/dL (ref 5–40)

## 2020-08-31 LAB — INSULIN, RANDOM: INSULIN: 6.8 u[IU]/mL (ref 2.6–24.9)

## 2020-08-31 LAB — VITAMIN D 25 HYDROXY (VIT D DEFICIENCY, FRACTURES): Vit D, 25-Hydroxy: 64.5 ng/mL (ref 30.0–100.0)

## 2020-08-31 MED ORDER — MELOXICAM 15 MG PO TABS: 15.0000 mg | ORAL_TABLET | Freq: Every day | ORAL | 0 refills | Status: DC

## 2020-08-31 NOTE — Assessment & Plan Note (Signed)
No stress reaction noted at this time.  Discussed posture and ergonomics, discussed which activities to do which wants to avoid.  Patient will continue to wear good shoes and discussed over-the-counter orthotics.  Follow-up with me again in 6 to 8 weeks

## 2020-08-31 NOTE — Patient Instructions (Signed)
Epidural Spenco Orthotics Total Support Avoid being barefoot Meloxicam refill take 3-5 day bursts when needed See me again in 6 weeks

## 2020-08-31 NOTE — Assessment & Plan Note (Signed)
Patient initially did improve but now having more pain again after the epidural.  Did have 3 weeks of relief and I think repeating could be beneficial.  Patient will have this done and then follow-up with me again 4 to 6 weeks afterwards.  Patient is in agreement with the plan no change in medications at this time does have a prescription now for meloxicam from [place].

## 2020-09-06 NOTE — Progress Notes (Signed)
Chief Complaint:   OBESITY Courtney [name] is here to discuss her progress with her obesity treatment plan along with follow-up of her obesity related diagnoses. Courtney [name] is on the Category 1 Plan and states she is following her eating plan approximately 90% of the time. Courtney [name] states she is doing water aerobics for 45 minutes 2 times per week.  Today's visit was #: 73 Starting weight: 157 lbs Starting date: 06/16/2017 Today's weight: 154 lbs Today's date: 08/30/2020 Total lbs lost to date: 3 Total lbs lost since last in-office visit: 0  Interim History: Courtney [name] has been off her normal routine and she has done more eating out. She is still mindful of her food choices, and she is trying to eat healthier.   Subjective:   1. Pre-diabetes Courtney [name] continues to work on decreasing simple carbohydrates and increase exercise. She is due for labs, and she denies signs of hypoglycemia.  2. Vitamin D deficiency Courtney [name] is stable on Vit D, and she is due to have labs checked.  3. Mixed hyperlipidemia Courtney [name] is on Crestor, and she is working on diet and exercise. She is due for labs.  Assessment/Plan:   1. Pre-diabetes Meriam will continue to work on diet, exercise, and decreasing simple carbohydrates to help decrease the risk of diabetes. We will check labs today.  - CMP14+EGFR - Insulin, random - Hemoglobin A1c  2. Vitamin D deficiency Low Vitamin D level contributes to fatigue and are associated with obesity, breast, and colon cancer. We will check labs today, and we will refill prescription Vitamin D for 1 month. Courtney [name] will follow-up for routine testing of Vitamin D, at least 2-3 times per year to avoid over-replacement.  - Vitamin D, Ergocalciferol, (DRISDOL) 1.25 MG (50000 UNIT) CAPS capsule; Take 1 capsule (50,000 Units total) by mouth every 7 (seven) days.  Dispense: 4 capsule; Refill: 0 - VITAMIN D 25 Hydroxy (Vit-D Deficiency, Fractures)  3. Mixed hyperlipidemia Cardiovascular risk and specific  lipid/LDL goals reviewed. We discussed several lifestyle modifications today. We will check labs today. Courtney [name] will continue to work on diet, exercise and weight loss efforts. Orders and follow up as documented in patient record.   - Lipid Panel With LDL/HDL Ratio  4. Obesity with current BMI 30.2 Courtney [name] is currently in the action stage of change. As such, her goal is to continue with weight loss efforts. She has agreed to the Category 1 Plan.   Exercise goals: As is.  Behavioral modification strategies: better snacking choices and travel eating strategies.  Courtney [name] has agreed to follow-up with our clinic in 4 weeks. She was informed of the importance of frequent follow-up visits to maximize her success with intensive lifestyle modifications for her multiple health conditions.   Courtney [name] was informed we would discuss her lab results at her next visit unless there is Courtney [name] critical issue that needs to be addressed sooner. Courtney [name] agreed to keep her next visit at the agreed upon time to discuss these results.  Objective:   Blood pressure 116/65, pulse (!) 46, temperature 97.8 F (36.6 C), height 5' (1.524 m), weight 154 lb (69.9 kg), SpO2 97 %. Body mass index is 30.08 kg/m.  General: Cooperative, alert, well developed, in no acute distress. HEENT: Conjunctivae and lids unremarkable. Cardiovascular: Regular rhythm.  Lungs: Normal work of breathing. Neurologic: No focal deficits.   Lab Results  Component Value Date   CREATININE 1.04 (H) 08/30/2020   BUN 31 (H) 08/30/2020   NA 139 08/30/2020   K 4.6  08/30/2020   CL 99 08/30/2020   CO2 22 08/30/2020   Lab Results  Component Value Date   ALT 21 08/30/2020   AST 23 08/30/2020   ALKPHOS 77 08/30/2020   BILITOT 0.3 08/30/2020   Lab Results  Component Value Date   HGBA1C 5.8 (H) 08/30/2020   HGBA1C 5.8 (H) 03/30/2020   HGBA1C 5.6 12/01/2019   HGBA1C 5.7 (H) 08/04/2019   HGBA1C 5.4 09/14/2018   Lab Results  Component Value Date    INSULIN 6.8 08/30/2020   INSULIN 9.4 03/30/2020   INSULIN 7.2 12/01/2019   INSULIN 4.9 08/04/2019   INSULIN 4.2 09/14/2018   Lab Results  Component Value Date   TSH 1.75 01/03/2020   Lab Results  Component Value Date   CHOL 164 08/30/2020   HDL 62 08/30/2020   LDLCALC 86 08/30/2020   TRIG 84 08/30/2020   CHOLHDL 2.6 03/30/2020   Lab Results  Component Value Date   VD25OH 64.5 08/30/2020   VD25OH 62.7 03/30/2020   VD25OH 68.5 12/01/2019   Lab Results  Component Value Date   WBC 7.3 01/03/2020   HGB 13.0 01/03/2020   HCT 37.7 01/03/2020   MCV 91.5 01/03/2020   PLT 196.0 01/03/2020   Lab Results  Component Value Date   IRON 114 01/03/2020   TIBC 328 01/25/2019   FERRITIN 54 01/25/2019    Obesity Behavioral Intervention:   Approximately 15 minutes were spent on the discussion below.  ASK: We discussed the diagnosis of obesity with Courtney [name] today and Courtney [name] agreed to give [place] permission to discuss obesity behavioral modification therapy today.  ASSESS: Courtney [name] has the diagnosis of obesity and her BMI today is 30.08. Courtney [name] is in the action stage of change.   ADVISE: Courtney [name] was educated on the multiple health risks of obesity as well as the benefit of weight loss to improve her health. She was advised of the need for long term treatment and the importance of lifestyle modifications to improve her current health and to decrease her risk of future health problems.  AGREE: Multiple dietary modification options and treatment options were discussed and Courtney [name] agreed to follow the recommendations documented in the above note.  ARRANGE: Courtney [name] was educated on the importance of frequent visits to treat obesity as outlined per CMS and USPSTF guidelines and agreed to schedule her next follow up appointment today.  Attestation Statements:   Reviewed by clinician on day of visit: allergies, medications, problem list, medical history, surgical history, family history, social history,  and previous encounter notes.   I, Trixie Dredge, am acting as transcriptionist for Dennard Nip, MD.  I have reviewed the above documentation for accuracy and completeness, and I agree with the above. -  Dennard Nip, MD

## 2020-09-17 NOTE — Progress Notes (Signed)
Cardiology Office Note:    Date:  09/21/2020   ID:  Courtney [name], DOB 03/04/1950, MRN [id number]  PCP:  Murray, Laura Woodruff, FNP  Cardiologist:  Peter Jordan, MD   Referring MD: Murray, Laura Woodruff,*   Chief Complaint  Patient presents with   Follow-up    CAD, HLD, HTN    History of Present Illness:    Courtney [name] is a 69 y.o. female with a hx of CAD, carotid artery disease, HLD, HTN, and PVCs. She is s/p BMS to mid RCA in 2009. Abnormal nuclear stress test in 2011 led to a repeat cath that showed nonobstructive disease. Repeat myoview in Oct 2018 was normal. She was last seen 12/06/19 by Hao Meng PA-C at which time he changed her metoprolol tartrate from 25 mg daily to 12.5 mg BID (standard dosing). BP normalized and she was doing well at follow up.   She presents today for routine visit. Her only complaint is right leg pain that radiates from her hip to her mid shin. Pain usually throbs, relieved by heat and tylenol. Pain can last several hours. Leg pain worse with prolonged standing. She denies recent travel. No calf pain, no pain with water aerobics.   She had a stress fracture in her left foot about 7 months ago that has interfered with her walking. She is now participating in water aerobics and swimming. She denies chest pain, dyspnea, lower extremity swelling, or orthopnea. Overall she is doing very well from a cardiology standpoint.   Past Medical History:  Diagnosis Date   Aortic valve sclerosis    echo 12/2008, soft systolic murmur   Arthritis    in right hip   Back pain    Basal cell carcinoma 05/31/2016   bcc left forehead  tx exc   Carotid artery disease (HCC)    40-59% bilateral, doppler December 2010   Cataract    Constipation    Coronary artery disease    a. BMS-mid RCA 11/2007 b. stress echo 02/2009 subtle inferior HK, lateral ST depressions with stress c. follow-up cath 02/2009: RCA stent patent, severe but stable stenosis of distal posterior lateral  branch RCA. LV normal, med tx unless more sx c. ETT Myoview 08/27/12: negative for ischemia; EF 64%   Decreased hearing    Depression    Mild, August, 2012   Dyslipidemia    Easy bruising    Ejection fraction    EF normal, catheterization, 2011 /  EF 55%, echo, 2010   Fluid overload    Mild, stable since hospitalization in the past   GERD (gastroesophageal reflux disease)    occarionally, takes Pepcid over the counter   Heart murmur    has, occasional PVC's   History of right hip replacement    Hyperlipidemia    Hypertension    Joint pain    Myocardial infarction (HCC)    2009   Palpitations    PVC (premature ventricular contraction)    per prior Holter monitor   Right hip pain 12/2009   Shellfish allergy    Stress fracture    Left foot   Trouble in sleeping    Vitamin B12 deficiency    Vitamin D deficiency     Past Surgical History:  Procedure Laterality Date   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/01/2005   BREAST BIOPSY Left    carcinoid tumor removal     colon-   CARDIAC CATHETERIZATION  1/211   Patent RCA stent, stenotic PLB supplying small   distribution; medically managed   CATARACT EXTRACTION, BILATERAL     CORONARY ANGIOPLASTY WITH STENT PLACEMENT  11/2007   BMS-mid RCA   DIAGNOSTIC LAPAROSCOPY     1982 for endometriosis   DILATION AND CURETTAGE OF UTERUS  2005   SKIN CANCER EXCISION     TOTAL HIP ARTHROPLASTY Right 07/08/2014   Procedure: RIGHT TOTAL HIP ARTHROPLASTY ANTERIOR APPROACH;  Surgeon: Mcarthur Rossetti, MD;  Location: [facility];  Service: Orthopedics;  Laterality: Right;   WISDOM TOOTH EXTRACTION      Current Medications: Current Meds  Medication Sig   acetaminophen (TYLENOL) 500 MG tablet Take 1,000 mg by mouth every 6 (six) hours as needed (Pain).   aspirin 81 MG tablet Take 81 mg by mouth daily.   dicyclomine (BENTYL) 20 MG tablet Take 1 tablet (20 mg total) by mouth every 6 (six) hours as needed for spasms.   ezetimibe (ZETIA) 10 MG tablet TAKE 1  TABLET (10 MG TOTAL) BY MOUTH DAILY.   fenofibrate (TRICOR) 145 MG tablet Take 1 tablet (145 mg total) by mouth daily.   gabapentin (NEURONTIN) 300 MG capsule Take 1 capsule (300 mg total) by mouth at bedtime.   melatonin 5 MG TABS Take 5 mg by mouth at bedtime as needed.   meloxicam (MOBIC) 15 MG tablet Take 1 tablet (15 mg total) by mouth daily.   metoprolol tartrate (LOPRESSOR) 25 MG tablet Take 12.5 mg by mouth 2 (two) times daily.   nitroGLYCERIN (NITROSTAT) 0.4 MG SL tablet Place 1 tablet (0.4 mg total) under the tongue every 5 (five) minutes as needed for chest pain.   pantoprazole (PROTONIX) 40 MG tablet Take 1 tablet (40 mg total) by mouth daily.   ramipril (ALTACE) 10 MG capsule TAKE 1 CAPSULE EVERY DAY   rosuvastatin (CRESTOR) 40 MG tablet Take 1 tablet (40 mg total) by mouth daily.   Vitamin D, Ergocalciferol, (DRISDOL) 1.25 MG (50000 UNIT) CAPS capsule Take 1 capsule (50,000 Units total) by mouth every 7 (seven) days.     Allergies:   Erythromycin, Penicillins, and Shellfish allergy   Social History   Socioeconomic History   Marital status: Divorced    Spouse name: Not on file   Number of children: 1   Years of education: 18   Highest education level: Not on file  Occupational History   Occupation: Retired - Retail buyer  Tobacco Use   Smoking status: Never   Smokeless tobacco: Never  Vaping Use   Vaping Use: Never used  Substance and Sexual Activity   Alcohol use: Yes    Comment: 1 glass of wine 3x/week   Drug use: No   Sexual activity: Yes  Other Topics Concern   Not on file  Social History Narrative   Right handed   Soda- sometimes   Drinks herbal tea   Fun: Dance, garden,    Denies religious beliefs effecting health care.   Denies abuse and feels safe at home   Social Determinants of Health   Financial Resource Strain: Low Risk    Difficulty of Paying Living Expenses: Not hard at all  Food Insecurity: No Food Insecurity   Worried About Paediatric nurse in the Last Year: Never true   [place] in the Last Year: Never true  Transportation Needs: No Transportation Needs   Lack of Transportation (Medical): No   Lack of Transportation (Non-Medical): No  Physical Activity: Sufficiently Active   Days of Exercise per Week: 5 days  Minutes of Exercise per Session: 30 min  Stress: No Stress Concern Present   Feeling of Stress : Not at all  Social Connections: Moderately Isolated   Frequency of Communication with Friends and Family: More than three times a week   Frequency of Social Gatherings with Friends and Family: More than three times a week   Attends Religious Services: Never   Active Member of Clubs or Organizations: No   Attends Club or Organization Meetings: Never   Marital Status: Living with partner     Family History: The patient's family history includes Heart attack in her paternal grandfather; Heart attack (age of onset: 70) in her father; Heart disease in her mother and sister; Hyperlipidemia in her father and mother; Hypertension in her father and mother; Obesity in her mother; Stroke in her mother; Sudden death in her father. There is no history of BRCA 1/2, Breast cancer, Esophageal cancer, Colon cancer, Pancreatic cancer, Liver disease, or Stomach cancer.  ROS:   Please see the history of present illness.    All other systems reviewed and are negative.  EKGs/Labs/Other Studies Reviewed:    The following studies were reviewed today:  Carotid ultrasound 2020 Summary:  Right Carotid: Velocities in the right ICA are consistent with a 1-39%  stenosis.   Left Carotid: Velocities in the left ICA are consistent with a 1-39%  stenosis.   Vertebrals:  Bilateral vertebral arteries demonstrate antegrade flow.  Subclavians: Normal flow hemodynamics were seen in bilateral subclavian               arteries.   EKG:  EKG is ordered today.  The ekg ordered today demonstrates sinus bradycardia with HR 45  Recent  Labs: 01/03/2020: Hemoglobin 13.0; Platelets 196.0; TSH 1.75 08/30/2020: ALT 21; BUN 31; Creatinine, Ser 1.04; Potassium 4.6; Sodium 139  Recent Lipid Panel    Component Value Date/Time   CHOL 164 08/30/2020 0755   TRIG 84 08/30/2020 0755   HDL 62 08/30/2020 0755   CHOLHDL 2.6 03/30/2020 1030   CHOLHDL 3 11/30/2015 1028   VLDL 25.4 11/30/2015 1028   LDLCALC 86 08/30/2020 0755    Physical Exam:    VS:  BP 118/68   Pulse (!) 45   Ht 5' (1.524 m)   Wt 156 lb 3.2 oz (70.9 kg)   BMI 30.51 kg/m     Wt Readings from Last 3 Encounters:  09/21/20 156 lb 3.2 oz (70.9 kg)  09/18/20 150 lb (68 kg)  08/31/20 157 lb (71.2 kg)     GEN: Well nourished, well developed in no acute distress HEENT: Normal NECK: No JVD; No carotid bruits LYMPHATICS: No lymphadenopathy CARDIAC: regular rhythm, bradycardic rate, + systolic  murmur RESPIRATORY:  Clear to auscultation without rales, wheezing or rhonchi  ABDOMEN: Soft, non-tender, non-distended MUSCULOSKELETAL:  No edema; No deformity  SKIN: Warm and dry NEUROLOGIC:  Alert and oriented x 3 PSYCHIATRIC:  Normal affect   ASSESSMENT:    1. Coronary artery disease involving native coronary artery of native heart without angina pectoris   2. Essential hypertension   3. Hyperlipidemia LDL goal <70   4. Aortic valve sclerosis   5. Left leg pain    PLAN:    In order of problems listed above:  CAD  BMS-RCA 2009 - continue ASA, crestor - nonischemic nuclear stress test 07/2016 - no angina   Hypertension - continue BB, ACEI - BP is well-controlled   Hyperlipidemia with LDL goal < 70 08/30/2020: Cholesterol, Total 164;   HDL 62; LDL Chol Calc (NIH) 86; Triglycerides 84 Continue 40 mg crestor and zetia Consider adding fenofibrate vs PCSK9i We had a long discussion regarding next steps to get her to LDL goal. She states she can't eat any better - has been working with health management clinic. She is also active with exercise. She is  amenable to PCSK9i. Will start fenofibrate today with plans for labs in 5 weeks and follow up with lipid clinic pharmD after that.  I doubt adding tricor will get her to goal, but we will try this first. If she proceeds with PCSK9i, may be able to D/C zetia and fenofibrate. I did tell her that if insurance did not pay for fenofibrate, do not pick it up and call us to get a sooner appt with pharmD.    Heart murmur - on exam - no recent echo - will obtain baseline echo - asymptomatic   Left leg pain - she describes left leg pain that sounds MSK/nerve related - pain is relieved with tylenol and NSAIDs, travels from her hip to mid shin, no calf pain, good pulses on exam, no swelling, redness, or tenderness to lower extremity - I have a low suspicion for arterial or venous blood flow abnormalities as the etiology for her leg pain - she will continue to follow with ortho/PCP   Dr. Jordan in 6 months.     Medication Adjustments/Labs and Tests Ordered: Current medicines are reviewed at length with the patient today.  Concerns regarding medicines are outlined above.  Orders Placed This Encounter  Procedures   Lipid panel   AMB Referral to [facility]   EKG 12-Lead    Meds ordered this encounter  Medications   fenofibrate (TRICOR) 145 MG tablet    Sig: Take 1 tablet (145 mg total) by mouth daily.    Dispense:  30 tablet    Refill:  0     Signed, Angela Nicole Duke, PA  09/21/2020 9:03 AM    [facility]  

## 2020-09-18 ENCOUNTER — Other Ambulatory Visit: Payer: Self-pay

## 2020-09-18 ENCOUNTER — Emergency Department (HOSPITAL_BASED_OUTPATIENT_CLINIC_OR_DEPARTMENT_OTHER)
Admission: EM | Admit: 2020-09-18 | Discharge: 2020-09-19 | Disposition: A | Discharge status: Home / Self Care | Payer: Medicare HMO | Attending: Emergency Medicine | Admitting: Emergency Medicine

## 2020-09-18 ENCOUNTER — Encounter (HOSPITAL_BASED_OUTPATIENT_CLINIC_OR_DEPARTMENT_OTHER): Payer: Self-pay

## 2020-09-18 DIAGNOSIS — S90461A Insect bite (nonvenomous), right great toe, initial encounter: Secondary | ICD-10-CM | POA: Insufficient documentation

## 2020-09-18 DIAGNOSIS — W57XXXA Bitten or stung by nonvenomous insect and other nonvenomous arthropods, initial encounter: Secondary | ICD-10-CM | POA: Diagnosis not present

## 2020-09-18 DIAGNOSIS — Z5321 Procedure and treatment not carried out due to patient leaving prior to being seen by health care provider: Secondary | ICD-10-CM | POA: Insufficient documentation

## 2020-09-18 NOTE — ED Triage Notes (Signed)
Patient here POV from Home with [place] Sting.  Patient was stung once while outside at approximately 1900 on the Posterior Side of Right Great Toe.   Moderate Swelling noted to Same. No Redness Noted; Patient complaining of Pain to Sting Site.  BIB Wheelchair. GCS 15. Patient attempted Cold/Warm Therapy, Ibuprofen, Tylenol, and Claritin with minimal Relief. No CP, No SOB.

## 2020-09-19 NOTE — ED Notes (Signed)
Patient not Available in Waiting Area for Reassessment or EDP Assessment.

## 2020-09-19 NOTE — ED Notes (Signed)
Patient will be discharged accordingly.

## 2020-09-21 ENCOUNTER — Encounter: Payer: Self-pay | Admitting: Physician Assistant

## 2020-09-21 ENCOUNTER — Other Ambulatory Visit: Payer: Self-pay

## 2020-09-21 ENCOUNTER — Ambulatory Visit: Payer: Medicare HMO | Admitting: Physician Assistant

## 2020-09-21 VITALS — BP 118/68 | HR 45 | Ht 60.0 in | Wt 156.2 lb

## 2020-09-21 DIAGNOSIS — I10 Essential (primary) hypertension: Secondary | ICD-10-CM | POA: Diagnosis not present

## 2020-09-21 DIAGNOSIS — M79605 Pain in left leg: Secondary | ICD-10-CM | POA: Diagnosis not present

## 2020-09-21 DIAGNOSIS — E785 Hyperlipidemia, unspecified: Secondary | ICD-10-CM | POA: Diagnosis not present

## 2020-09-21 DIAGNOSIS — R011 Cardiac murmur, unspecified: Secondary | ICD-10-CM | POA: Diagnosis not present

## 2020-09-21 DIAGNOSIS — I2510 Atherosclerotic heart disease of native coronary artery without angina pectoris: Secondary | ICD-10-CM

## 2020-09-21 DIAGNOSIS — I358 Other nonrheumatic aortic valve disorders: Secondary | ICD-10-CM

## 2020-09-21 MED ORDER — EZETIMIBE 10 MG PO TABS: 10.0000 mg | ORAL_TABLET | Freq: Every day | ORAL | 3 refills | Status: DC

## 2020-09-21 MED ORDER — ROSUVASTATIN CALCIUM 40 MG PO TABS: 40.0000 mg | ORAL_TABLET | Freq: Every day | ORAL | 3 refills | Status: DC

## 2020-09-21 MED ORDER — RAMIPRIL 10 MG PO CAPS: 10.0000 mg | ORAL_CAPSULE | Freq: Every day | ORAL | 3 refills | Status: DC

## 2020-09-21 MED ORDER — FENOFIBRATE 145 MG PO TABS: 145.0000 mg | ORAL_TABLET | Freq: Every day | ORAL | 0 refills | Status: DC

## 2020-09-21 MED ORDER — METOPROLOL TARTRATE 25 MG PO TABS: 12.5000 mg | ORAL_TABLET | Freq: Two times a day (BID) | ORAL | 3 refills | Status: DC

## 2020-09-21 NOTE — Patient Instructions (Addendum)
Medication Instructions:  START Tricor 145 mg daily  *If you need a refill on your cardiac medications before your next appointment, please call your pharmacy*  Lab Work: Your physician recommends that you return for lab work in 5 weeks 10/26/20:  Fasting Lipid Panel-DO NOT EAT OR DRINK PAST MIDNIGHT. OKAY TO HAVE WATER.  If you have labs (blood work) drawn today and your tests are completely normal, you will receive your results only by: [place] (if you have MyChart) OR A paper copy in the mail If you have any lab test that is abnormal or we need to change your treatment, we will call you to review the results.  Testing/Procedures: Your physician has requested that you have an echocardiogram. Echocardiography is a painless test that uses sound waves to create images of your heart. It provides your doctor with information about the size and shape of your heart and how well your heart's chambers and valves are working. This procedure takes approximately one hour. There are no restrictions for this procedure. This test is performed at [facility], [place], [place] [address]  Please schedule for 1-2 months at the [facility] office   Follow-Up: At [facility], you and your health needs are our priority.  As part of our continuing mission to provide you with exceptional heart care, we have created designated Provider Care Teams.  These Care Teams include your primary Cardiologist (physician) and Advanced Practice Providers (APPs -  Physician Assistants and Nurse Practitioners) who all work together to provide you with the care you need, when you need it.  Your next appointment:   6 month(s)  The format for your next appointment:   In Person  Provider:   Peter Martinique, MD  Other Instructions You have been referred to [facility] with Pharm D. Someone will contact you to schedule your appointment for 5-6 weeks

## 2020-09-27 ENCOUNTER — Ambulatory Visit
Admission: RE | Admit: 2020-09-27 | Discharge: 2020-09-27 | Disposition: A | Discharge status: Home / Self Care | Payer: Medicare HMO | Source: Ambulatory Visit | Attending: Family Medicine | Admitting: Family Medicine

## 2020-09-27 ENCOUNTER — Other Ambulatory Visit: Payer: Self-pay

## 2020-09-27 DIAGNOSIS — M5416 Radiculopathy, lumbar region: Secondary | ICD-10-CM

## 2020-09-27 DIAGNOSIS — M5116 Intervertebral disc disorders with radiculopathy, lumbar region: Secondary | ICD-10-CM | POA: Diagnosis not present

## 2020-09-27 MED ORDER — METHYLPREDNISOLONE ACETATE 40 MG/ML INJ SUSP (RADIOLOG
80.0000 mg | Freq: Once | INTRAMUSCULAR | Status: AC
  Administered 2020-09-27: 80 mg via EPIDURAL

## 2020-09-27 MED ORDER — IOPAMIDOL (ISOVUE-M 200) INJECTION 41%
1.0000 mL | Freq: Once | INTRAMUSCULAR | Status: AC
  Administered 2020-09-27: 1 mL via EPIDURAL

## 2020-09-27 NOTE — Discharge Instructions (Signed)

## 2020-10-03 ENCOUNTER — Other Ambulatory Visit: Payer: Self-pay

## 2020-10-03 ENCOUNTER — Ambulatory Visit (HOSPITAL_COMMUNITY): Payer: Medicare HMO | Attending: Cardiovascular Disease

## 2020-10-03 DIAGNOSIS — R011 Cardiac murmur, unspecified: Secondary | ICD-10-CM | POA: Insufficient documentation

## 2020-10-03 LAB — ECHOCARDIOGRAM COMPLETE
AR max vel: 0.94 cm2
AV Area VTI: 1.02 cm2
AV Area mean vel: 0.97 cm2
AV Mean grad: 11 mmHg
AV Peak grad: 20.6 mmHg
Ao pk vel: 2.27 m/s
Area-P 1/2: 4.89 cm2
S' Lateral: 3.2 cm

## 2020-10-05 ENCOUNTER — Other Ambulatory Visit: Payer: Self-pay

## 2020-10-05 ENCOUNTER — Encounter (INDEPENDENT_AMBULATORY_CARE_PROVIDER_SITE_OTHER): Payer: Self-pay | Admitting: Family Medicine

## 2020-10-05 ENCOUNTER — Ambulatory Visit (INDEPENDENT_AMBULATORY_CARE_PROVIDER_SITE_OTHER): Payer: Medicare HMO | Admitting: Family Medicine

## 2020-10-05 VITALS — BP 101/60 | HR 44 | Temp 97.6°F | Ht 60.0 in | Wt 146.0 lb

## 2020-10-05 DIAGNOSIS — E7849 Other hyperlipidemia: Secondary | ICD-10-CM

## 2020-10-05 DIAGNOSIS — E669 Obesity, unspecified: Secondary | ICD-10-CM | POA: Diagnosis not present

## 2020-10-05 DIAGNOSIS — E559 Vitamin D deficiency, unspecified: Secondary | ICD-10-CM

## 2020-10-07 ENCOUNTER — Other Ambulatory Visit (INDEPENDENT_AMBULATORY_CARE_PROVIDER_SITE_OTHER): Payer: Self-pay | Admitting: Family Medicine

## 2020-10-07 DIAGNOSIS — E559 Vitamin D deficiency, unspecified: Secondary | ICD-10-CM

## 2020-10-09 MED ORDER — VITAMIN D (ERGOCALCIFEROL) 1.25 MG (50000 UNIT) PO CAPS: 50000.0000 [IU] | ORAL_CAPSULE | ORAL | 0 refills | Status: DC

## 2020-10-09 NOTE — Telephone Encounter (Signed)
Pt last seen by Dr. Beasley.  

## 2020-10-09 NOTE — Progress Notes (Signed)
Chief Complaint:   OBESITY Courtney [name] is here to discuss her progress with her obesity treatment plan along with follow-up of her obesity related diagnoses. Courtney [name] is on the Category 1 Plan and states she is following her eating plan approximately 95% of the time. Courtney [name] states she is doing 0 minutes 0 times per week.  Today's visit was #: 62 Starting weight: 157 lbs Starting date: 06/16/2017 Today's weight: 146 lbs Today's date: 10/05/2020 Total lbs lost to date: 11 Total lbs lost since last in-office visit: 8  Interim History: Courtney [name] has had some GI upset and had a decreased appetite. She has been eating a bland diet with chicken and she has been feeling better. She has been off her swim routine recently as well.  Subjective:   1. Vitamin D deficiency Courtney [name] is stable on Vit D, and her labs are at goal.  2. Other hyperlipidemia Courtney [name] is followed by the Courtney [name], and she has done very well with minimizing the cholesterol in her diet.  Assessment/Plan:   1. Vitamin D deficiency Low Vitamin D level contributes to fatigue and are associated with obesity, breast, and colon cancer. We will refill prescription Vitamin D for 1 month. Courtney [name] will follow-up for routine testing of Vitamin D, at least 2-3 times per year to avoid over-replacement.  - Vitamin D, Ergocalciferol, (DRISDOL) 1.25 MG (50000 UNIT) CAPS capsule; Take 1 capsule (50,000 Units total) by mouth every 7 (seven) days.  Dispense: 4 capsule; Refill: 0  2. Other hyperlipidemia Cardiovascular risk and specific lipid/LDL goals reviewed. We discussed several lifestyle modifications today. I reviewed labs with the patient today. Courtney [name] will continue to work on diet, exercise, and weight loss efforts. Orders and follow up as documented in patient record.   3. Obesity with current BMI 28.7 Courtney [name] is currently in the action stage of change. As such, her goal is to continue with weight loss efforts. She has agreed to the Category 1 Plan.    Exercise goals: All adults should avoid inactivity. Some physical activity is better than none, and adults who participate in any amount of physical activity gain some health benefits.  Behavioral modification strategies: increasing lean protein intake and increasing vegetables.  Courtney [name] has agreed to follow-up with our [name] in 4 weeks. She was informed of the importance of frequent follow-up visits to maximize her success with intensive lifestyle modifications for her multiple health conditions.   Objective:   Blood pressure 101/60, pulse (!) 44, temperature 97.6 F (36.4 C), height 5' (1.524 m), weight 146 lb (66.2 kg), SpO2 98 %. Body mass index is 28.51 kg/m.  General: Cooperative, alert, well developed, in no acute distress. HEENT: Conjunctivae and lids unremarkable. Cardiovascular: Regular rhythm.  Lungs: Normal work of breathing. Neurologic: No focal deficits.   Lab Results  Component Value Date   CREATININE 1.04 (H) 08/30/2020   BUN 31 (H) 08/30/2020   NA 139 08/30/2020   K 4.6 08/30/2020   CL 99 08/30/2020   CO2 22 08/30/2020   Lab Results  Component Value Date   ALT 21 08/30/2020   AST 23 08/30/2020   ALKPHOS 77 08/30/2020   BILITOT 0.3 08/30/2020   Lab Results  Component Value Date   HGBA1C 5.8 (H) 08/30/2020   HGBA1C 5.8 (H) 03/30/2020   HGBA1C 5.6 12/01/2019   HGBA1C 5.7 (H) 08/04/2019   HGBA1C 5.4 09/14/2018   Lab Results  Component Value Date   INSULIN 6.8 08/30/2020   INSULIN 9.4 03/30/2020  INSULIN 7.2 12/01/2019   INSULIN 4.9 08/04/2019   INSULIN 4.2 09/14/2018   Lab Results  Component Value Date   TSH 1.75 01/03/2020   Lab Results  Component Value Date   CHOL 164 08/30/2020   HDL 62 08/30/2020   LDLCALC 86 08/30/2020   TRIG 84 08/30/2020   CHOLHDL 2.6 03/30/2020   Lab Results  Component Value Date   VD25OH 64.5 08/30/2020   VD25OH 62.7 03/30/2020   VD25OH 68.5 12/01/2019   Lab Results  Component Value Date   WBC 7.3  01/03/2020   HGB 13.0 01/03/2020   HCT 37.7 01/03/2020   MCV 91.5 01/03/2020   PLT 196.0 01/03/2020   Lab Results  Component Value Date   IRON 114 01/03/2020   TIBC 328 01/25/2019   FERRITIN 54 01/25/2019    Obesity Behavioral Intervention:   Approximately 15 minutes were spent on the discussion below.  ASK: We discussed the diagnosis of obesity with [name] today and [name] agreed to give [place] permission to discuss obesity behavioral modification therapy today.  ASSESS: [name] has the diagnosis of obesity and her BMI today is 28.7. [name] is in the action stage of change.   ADVISE: [name] was educated on the multiple health risks of obesity as well as the benefit of weight loss to improve her health. She was advised of the need for long term treatment and the importance of lifestyle modifications to improve her current health and to decrease her risk of future health problems.  AGREE: Multiple dietary modification options and treatment options were discussed and [name] agreed to follow the recommendations documented in the above note.  ARRANGE: [name] was educated on the importance of frequent visits to treat obesity as outlined per CMS and USPSTF guidelines and agreed to schedule her next follow up appointment today.  Attestation Statements:   Reviewed by clinician on day of visit: allergies, medications, problem list, medical history, surgical history, family history, social history, and previous encounter notes.   I, Trixie Dredge, am acting as transcriptionist for Dennard Nip, MD.  I have reviewed the above documentation for accuracy and completeness, and I agree with the above. -  Dennard Nip, MD

## 2020-10-11 ENCOUNTER — Encounter: Payer: Self-pay | Admitting: *Deleted

## 2020-10-17 ENCOUNTER — Other Ambulatory Visit: Payer: Self-pay

## 2020-10-17 ENCOUNTER — Ambulatory Visit: Payer: Medicare HMO | Admitting: Family Medicine

## 2020-10-17 ENCOUNTER — Ambulatory Visit: Payer: Self-pay

## 2020-10-17 VITALS — BP 112/70 | HR 48 | Ht 60.0 in

## 2020-10-17 DIAGNOSIS — G2581 Restless legs syndrome: Secondary | ICD-10-CM | POA: Diagnosis not present

## 2020-10-17 DIAGNOSIS — M84375D Stress fracture, left foot, subsequent encounter for fracture with routine healing: Secondary | ICD-10-CM

## 2020-10-17 LAB — IBC PANEL
Iron: 117 ug/dL (ref 42–145)
Saturation Ratios: 27.1 % (ref 20.0–50.0)
TIBC: 431.2 ug/dL (ref 250.0–450.0)
Transferrin: 308 mg/dL (ref 212.0–360.0)

## 2020-10-17 LAB — COMPREHENSIVE METABOLIC PANEL
ALT: 18 U/L (ref 0–35)
AST: 31 U/L (ref 0–37)
Albumin: 4.2 g/dL (ref 3.5–5.2)
Alkaline Phosphatase: 46 U/L (ref 39–117)
BUN: 27 mg/dL — ABNORMAL HIGH (ref 6–23)
CO2: 27 mEq/L (ref 19–32)
Calcium: 9.5 mg/dL (ref 8.4–10.5)
Chloride: 105 mEq/L (ref 96–112)
Creatinine, Ser: 1.09 mg/dL (ref 0.40–1.20)
GFR: 51.03 mL/min — ABNORMAL LOW (ref >60.00)
Glucose, Bld: 75 mg/dL (ref 70–99)
Potassium: 4 mEq/L (ref 3.5–5.1)
Sodium: 138 mEq/L (ref 135–145)
Total Bilirubin: 0.4 mg/dL (ref 0.2–1.2)
Total Protein: 7 g/dL (ref 6.0–8.3)

## 2020-10-17 LAB — FERRITIN: Ferritin: 133.2 ng/mL (ref 10.0–291.0)

## 2020-10-17 NOTE — Progress Notes (Signed)
Courtney [name] Phone: [phone] Subjective:    I'm seeing this patient by the request  of:  Marrian Salvage, FNP  CC: Left hip, lower back, and leg pain  [id number]  08/31/2020 No stress reaction noted at this time.  Discussed posture and ergonomics, discussed which activities to do which wants to avoid.  Patient will continue to wear good shoes and discussed over-the-counter orthotics.  Follow-up with me again in 6 to 8 weeks  Patient initially did improve but now having more pain again after the epidural.  Did have 3 weeks of relief and I think repeating could be beneficial.  Patient will have this done and then follow-up with me again 4 to 6 weeks afterwards.  Patient is in agreement with the plan no change in medications at this time does have a prescription now for meloxicam from [place].  Update 10/17/2020 Courtney [name] is a 70 y.o. female coming in with complaint of L foot and lower back pain. Patient had epidural on 09/27/2020. Patient states that she wore her tennis shoes at the beach and her foot is feeling 100% better.   Also complaining of R calf pain. Started new cardio medicine Tricor. Pain occurring since Sunday after having a cramp in calf. Most painful when she is weight bearing. Using tylenol and heat for pain relief.  Patient states that mostly the cramping seems to be more at night.  Epidural did help with back pain.  Patient is very minimal discomfort at this time.  Epidural was August 10.       Past Medical History:  Diagnosis Date   Aortic valve sclerosis    echo 62/1308, soft systolic murmur   Arthritis    in right hip   Back pain    Basal cell carcinoma 05/31/2016   bcc left forehead  tx exc   Carotid artery disease (HCC)    40-59% bilateral, doppler December 2010   Cataract    Constipation    Coronary artery disease    a. BMS-mid RCA 11/2007 b. stress echo 02/2009 subtle inferior  HK, lateral ST depressions with stress c. follow-up cath 02/2009: RCA stent patent, severe but stable stenosis of distal posterior lateral branch RCA. LV normal, med tx unless more sx c. ETT Myoview 08/27/12: negative for ischemia; EF 64%   Decreased hearing    Depression    Mild, August, 2012   Dyslipidemia    Easy bruising    Ejection fraction    EF normal, catheterization, 2011 /  EF 55%, echo, 2010   Fluid overload    Mild, stable since hospitalization in the past   GERD (gastroesophageal reflux disease)    occarionally, takes Pepcid over the counter   Heart murmur    has, occasional PVC's   History of right hip replacement    Hyperlipidemia    Hypertension    Joint pain    Myocardial infarction ([place])    2009   Palpitations    PVC (premature ventricular contraction)    per prior Holter monitor   Right hip pain 12/2009   Shellfish allergy    Stress fracture    Left foot   Trouble in sleeping    Vitamin B12 deficiency    Vitamin D deficiency    Past Surgical History:  Procedure Laterality Date   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/01/2005   BREAST BIOPSY Left    carcinoid tumor removal  colon-   CARDIAC CATHETERIZATION  1/211   Patent RCA stent, stenotic PLB supplying small distribution; medically managed   CATARACT EXTRACTION, BILATERAL     CORONARY ANGIOPLASTY WITH STENT PLACEMENT  11/2007   BMS-mid RCA   DIAGNOSTIC LAPAROSCOPY     1982 for endometriosis   DILATION AND CURETTAGE OF UTERUS  2005   SKIN CANCER EXCISION     TOTAL HIP ARTHROPLASTY Right 07/08/2014   Procedure: RIGHT TOTAL HIP ARTHROPLASTY ANTERIOR APPROACH;  Surgeon: Mcarthur Rossetti, MD;  Location: [facility];  Service: Orthopedics;  Laterality: Right;   WISDOM TOOTH EXTRACTION     Social History   Socioeconomic History   Marital status: Divorced    Spouse name: Not on file   Number of children: 1   Years of education: 97   Highest education level: Not on file  Occupational History   Occupation:  Retired - Retail buyer  Tobacco Use   Smoking status: Never   Smokeless tobacco: Never  Vaping Use   Vaping Use: Never used  Substance and Sexual Activity   Alcohol use: Yes    Comment: 1 glass of wine 3x/week   Drug use: No   Sexual activity: Yes  Other Topics Concern   Not on file  Social History Narrative   Right handed   Soda- sometimes   Drinks herbal tea   Fun: Dance, garden,    Denies religious beliefs effecting health care.   Denies abuse and feels safe at home   Social Determinants of Health   Financial Resource Strain: Low Risk    Difficulty of Paying Living Expenses: Not hard at all  Food Insecurity: No Food Insecurity   Worried About Charity fundraiser in the Last Year: Never true   [place] in the Last Year: Never true  Transportation Needs: No Transportation Needs   Lack of Transportation (Medical): No   Lack of Transportation (Non-Medical): No  Physical Activity: Sufficiently Active   Days of Exercise per Week: 5 days   Minutes of Exercise per Session: 30 min  Stress: No Stress Concern Present   Feeling of Stress : Not at all  Social Connections: Moderately Isolated   Frequency of Communication with Friends and Family: More than three times a week   Frequency of Social Gatherings with Friends and Family: More than three times a week   Attends Religious Services: Never   Marine scientist or Organizations: No   Attends Music therapist: Never   Marital Status: Living with partner   Allergies  Allergen Reactions   Erythromycin Hives   Penicillins Hives    Has patient had a PCN reaction causing immediate rash, facial/tongue/throat swelling, SOB or lightheadedness with hypotension: No Has patient had a PCN reaction causing severe rash involving mucus membranes or skin necrosis: No Has patient had a PCN reaction that required hospitalization: No Has patient had a PCN reaction occurring within the last 10 years: No If all of  the above answers are "NO", then may proceed with Cephalosporin use.    Shellfish Allergy Hives   Family History  Problem Relation Age of Onset   Heart attack Father 76   Hyperlipidemia Father    Hypertension Father    Sudden death Father    Stroke Mother    Hyperlipidemia Mother    Hypertension Mother    Heart disease Mother    Obesity Mother    Heart attack Paternal Grandfather    Heart  disease Sister    BRCA 1/2 Neg Hx    Breast cancer Neg Hx    Esophageal cancer Neg Hx    Colon cancer Neg Hx    Pancreatic cancer Neg Hx    Liver disease Neg Hx    Stomach cancer Neg Hx      Current Outpatient Medications (Cardiovascular):    ezetimibe (ZETIA) 10 MG tablet, Take 1 tablet (10 mg total) by mouth daily.   fenofibrate (TRICOR) 145 MG tablet, Take 1 tablet (145 mg total) by mouth daily.   metoprolol tartrate (LOPRESSOR) 25 MG tablet, Take 0.5 tablets (12.5 mg total) by mouth 2 (two) times daily.   nitroGLYCERIN (NITROSTAT) 0.4 MG SL tablet, Place 1 tablet (0.4 mg total) under the tongue every 5 (five) minutes as needed for chest pain.   ramipril (ALTACE) 10 MG capsule, Take 1 capsule (10 mg total) by mouth daily.   rosuvastatin (CRESTOR) 40 MG tablet, Take 1 tablet (40 mg total) by mouth daily.   Current Outpatient Medications (Analgesics):    acetaminophen (TYLENOL) 500 MG tablet, Take 1,000 mg by mouth every 6 (six) hours as needed (Pain).   aspirin 81 MG tablet, Take 81 mg by mouth daily.   meloxicam (MOBIC) 15 MG tablet, Take 1 tablet (15 mg total) by mouth daily.   Current Outpatient Medications (Other):    dicyclomine (BENTYL) 20 MG tablet, Take 1 tablet (20 mg total) by mouth every 6 (six) hours as needed for spasms.   gabapentin (NEURONTIN) 300 MG capsule, Take 1 capsule (300 mg total) by mouth at bedtime.   Melatonin 10 MG TABS, Take by mouth.   pantoprazole (PROTONIX) 40 MG tablet, Take 1 tablet (40 mg total) by mouth daily.   Passion Flower-Valerian 500-500 MG  CAPS, Take 1 capsule by mouth as needed.   Vitamin D, Ergocalciferol, (DRISDOL) 1.25 MG (50000 UNIT) CAPS capsule, Take 1 capsule (50,000 Units total) by mouth every 7 (seven) days.   Reviewed prior external information including notes and imaging from  primary care provider As well as notes that were available from care everywhere and other healthcare systems.  Past medical history, social, surgical and family history all reviewed in electronic medical record.  No pertanent information unless stated regarding to the chief complaint.   Review of Systems:  No headache, visual changes, nausea, vomiting, diarrhea, constipation, dizziness, abdominal pain, skin rash, fevers, chills, night sweats, weight loss, swollen lymph nodes, body aches, joint swelling, chest pain, shortness of breath, mood changes. POSITIVE muscle aches  Objective  Blood pressure 112/70, pulse (!) 48, height 5' (1.524 m), SpO2 98 %.   General: No apparent distress alert and oriented x3 mood and affect normal, dressed appropriately.  HEENT: Pupils equal, extraocular movements intact  Respiratory: Patient's speak in full sentences and does not appear short of breath  Cardiovascular: No lower extremity edema, non tender, no erythema  Gait normal with good balance and coordination.  MSK: Foot exam is unremarkable today.  No significant pain.  Still does have the breakdown of the longitudinal and transverse arch manually.  Negative squeeze test.  Neurovascularly intact. Right calf does have some pain noted in the muscle head of the medial gastroc.  No masses appreciated.  Negative Thompson.  Patient noted does have pain in the area that does seem out of proportion.  No swelling or erythema noted. Patient does give history of recent travel to the beach with lots of driving.    Impression and Recommendations:  The above documentation has been reviewed and is accurate and complete Lyndal Pulley, DO

## 2020-10-17 NOTE — Assessment & Plan Note (Signed)
Patient has been diagnosed with restless leg syndrome.  He has most mornings when she starts having increasing discomfort in the calf.  He did have recent travel and will get a D-dimer, has had labs that did show dehydration we discussed monitoring this, in addition to this we also discussed a potential sleep study.  We will order this to see if anything else is contributing.  Patient will follow up with me again in 2 to 3 months otherwise.  Foot pain is significantly improved.

## 2020-10-17 NOTE — Patient Instructions (Signed)
Good to see you  Will get a D-dimer, CMET, iron panel and ferritin  If any shortness of breath, chest pain, seek medical attention immediately.  Sleep study also ordered with pulmonary to check the cramping at night  See me again in 8 weeks if you need anything

## 2020-10-17 NOTE — Assessment & Plan Note (Signed)
Ultrasound was ordered but not completed.  Patient is doing better with no significant pain.  Can increase activity as tolerated.  Gabapentin has helped significantly at night.  Patient can increase activity

## 2020-10-18 LAB — D-DIMER, QUANTITATIVE: D-Dimer, Quant: 0.3 mcg/mL FEU (ref <0.50)

## 2020-10-25 ENCOUNTER — Other Ambulatory Visit: Payer: Self-pay

## 2020-10-25 DIAGNOSIS — E785 Hyperlipidemia, unspecified: Secondary | ICD-10-CM | POA: Diagnosis not present

## 2020-10-25 LAB — LIPID PANEL
Chol/HDL Ratio: 2.3 ratio (ref 0.0–4.4)
Cholesterol, Total: 156 mg/dL (ref 100–199)
HDL: 67 mg/dL (ref >39)
LDL Chol Calc (NIH): 76 mg/dL (ref 0–99)
Triglycerides: 66 mg/dL (ref 0–149)
VLDL Cholesterol Cal: 13 mg/dL (ref 5–40)

## 2020-10-26 ENCOUNTER — Ambulatory Visit: Payer: Medicare HMO | Admitting: Pharmacist Clinician (PhC)/ Clinical Pharmacy Specialist

## 2020-10-26 ENCOUNTER — Ambulatory Visit: Payer: Medicare HMO

## 2020-10-26 ENCOUNTER — Other Ambulatory Visit: Payer: Self-pay

## 2020-10-26 DIAGNOSIS — E782 Mixed hyperlipidemia: Secondary | ICD-10-CM | POA: Diagnosis not present

## 2020-10-26 MED ORDER — REPATHA SURECLICK 140 MG/ML ~~LOC~~ SOAJ: 140.0000 mg | SUBCUTANEOUS | 0 refills | Status: DC

## 2020-10-26 NOTE — Patient Instructions (Addendum)
Your Results:             Your most recent labs Goal  Total Cholesterol 156 < 200  Triglycerides 66 < 150  HDL (happy/good cholesterol) 67 > 40  LDL (lousy/bad cholesterol 76 < 70      Medication changes:  Stop TriCor (fenofibrate)  Start Repatha 140 mg once every 2 weeks.  Continue with rosuvastatin (Crestor) and ezetimibe (Zetia)  Lab orders:  We will watch for labs drawn by Dr. Shary Decamp in the next couple of months   Questions or concerns: Call Courtney [name]/Chris at [phone]  Thank you for choosing [facility]

## 2020-10-26 NOTE — Progress Notes (Signed)
10/27/2020 Courtney [name] [birth year] [id number]   HPI:  Courtney [name] is a 70 y.o. female patient of Dr. Martinique, who presents today for a lipid clinic evaluation.  Her medical history is significant for hypertension, currently well controlled, and  ASCVD with a BMS placed to the RCA in 2009 and stable bilateral ICA stenosis at 40-59%.  She is currently on rosuvastatin 40 mg and ezetimibe 10 mg, both daily.  At her last visit she was prescribed fenofibrate, however she ran out of this several days ago and wanted to be seen before determining if it should be refilled.    Current Medications: ezetimbe 10 mg, rosuvastatin 40, fenofibrate (ran out Wednedsday)  Cholesterol Goals: LDL < 70  Family history: mother stroke at 29, died at 25; father died at 26 from MI died in her 54's; sister (now deceased) had CVD; notes parents and sister all were smokers)  brother healthy; daughter 2 doesn't go to doctors - does have hypertension  Diet: home cooked meals, sees Dr. Leafy Ro at [facility], weight stable, rare red meat, mostly chicken occasional pork; cottage cheese w/ blueberries for breakfast, sandwich and apple for lunch  Exercise:  [employer] water aerobics twice weekly, not for past few weeks, trying to get back; stress fracture in foot earlier this year, had previously walked and hopes to get out again soon  Labs: 10/25/20: TC 156, TG 66, HDL 67, LDL 76   Current Outpatient Medications  Medication Sig Dispense Refill   acetaminophen (TYLENOL) 500 MG tablet Take 1,000 mg by mouth every 6 (six) hours as needed (Pain).     aspirin 81 MG tablet Take 81 mg by mouth daily.     dicyclomine (BENTYL) 20 MG tablet Take 1 tablet (20 mg total) by mouth every 6 (six) hours as needed for spasms. 180 tablet 0   Evolocumab (REPATHA SURECLICK) [id number] MG/ML SOAJ Inject 140 mg into the skin every 14 (fourteen) days. 1 mL 0   ezetimibe (ZETIA) 10 MG tablet Take 1 tablet (10 mg total) by mouth daily. 90 tablet 3   gabapentin  (NEURONTIN) 300 MG capsule Take 1 capsule (300 mg total) by mouth at bedtime. 90 capsule 3   Melatonin 10 MG TABS Take by mouth.     meloxicam (MOBIC) 15 MG tablet Take 1 tablet (15 mg total) by mouth daily. 30 tablet 0   metoprolol tartrate (LOPRESSOR) 25 MG tablet Take 0.5 tablets (12.5 mg total) by mouth 2 (two) times daily. 90 tablet 3   nitroGLYCERIN (NITROSTAT) 0.4 MG SL tablet Place 1 tablet (0.4 mg total) under the tongue every 5 (five) minutes as needed for chest pain. 25 tablet 11   pantoprazole (PROTONIX) 40 MG tablet Take 1 tablet (40 mg total) by mouth daily. 90 tablet 3   Passion Flower-Valerian 500-500 MG CAPS Take 1 capsule by mouth as needed.     ramipril (ALTACE) 10 MG capsule Take 1 capsule (10 mg total) by mouth daily. 90 capsule 3   rosuvastatin (CRESTOR) 40 MG tablet Take 1 tablet (40 mg total) by mouth daily. 90 tablet 3   Vitamin D, Ergocalciferol, (DRISDOL) 1.25 MG (50000 UNIT) CAPS capsule Take 1 capsule (50,000 Units total) by mouth every 7 (seven) days. 4 capsule 0   No current facility-administered medications for this visit.    Allergies  Allergen Reactions   Erythromycin Hives   Penicillins Hives    Has patient had a PCN reaction causing immediate rash, facial/tongue/throat swelling, SOB or lightheadedness with  hypotension: No Has patient had a PCN reaction causing severe rash involving mucus membranes or skin necrosis: No Has patient had a PCN reaction that required hospitalization: No Has patient had a PCN reaction occurring within the last 10 years: No If all of the above answers are "NO", then may proceed with Cephalosporin use.    Shellfish Allergy Hives    Past Medical History:  Diagnosis Date   Aortic valve sclerosis    echo [id number], soft systolic murmur   Arthritis    in right hip   Back pain    Basal cell carcinoma 05/31/2016   bcc left forehead  tx exc   Carotid artery disease (HCC)    40-59% bilateral, doppler December 2010   Cataract     Constipation    Coronary artery disease    a. BMS-mid RCA 11/2007 b. stress echo 02/2009 subtle inferior HK, lateral ST depressions with stress c. follow-up cath 02/2009: RCA stent patent, severe but stable stenosis of distal posterior lateral branch RCA. LV normal, med tx unless more sx c. ETT Myoview 08/27/12: negative for ischemia; EF 64%   Decreased hearing    Depression    Mild, August, 2012   Dyslipidemia    Easy bruising    Ejection fraction    EF normal, catheterization, 2011 /  EF 55%, echo, 2010   Fluid overload    Mild, stable since hospitalization in the past   GERD (gastroesophageal reflux disease)    occarionally, takes Pepcid over the counter   Heart murmur    has, occasional PVC's   History of right hip replacement    Hyperlipidemia    Hypertension    Joint pain    Myocardial infarction ([place])    2009   Palpitations    PVC (premature ventricular contraction)    per prior Holter monitor   Right hip pain 12/2009   Shellfish allergy    Stress fracture    Left foot   Trouble in sleeping    Vitamin B12 deficiency    Vitamin D deficiency     Blood pressure 124/66, pulse (!) 54, resp. rate 17, height 5' (1.524 m), weight 155 lb 6.4 oz (70.5 kg), SpO2 97 %.   Mixed hyperlipidemia, Rx Crestor and Zetia Patient with ASCVD and hyperlipidemia, currently not at goal LDL despite maximum doses of rosuvastatin and ezetimibe.  Reviewed options for lowering LDL cholesterol, including ezetimibe, PCSK-9 inhibitors, bempedoic acid and inclisiran.  Discussed mechanisms of action, dosing, side effects and potential decreases in LDL cholesterol.  Also reviewed cost information and potential options for patient assistance.  Answered all patient questions.  Based on this information, patient would prefer to start PCSK-9 inhibitor.  She was shown injection technique with the pen in office today and given one sample to try at home.  We will have use Repatha 140 mg every 14 days and repeat  lipid and liver function labs after 2-3 month on medication.    Will have her discontinue fenofibrate, as her triglyceride levels are excellent.     Tommy Medal PharmD CPP [facility] [address] [place], [place] [address] [phone]

## 2020-10-27 ENCOUNTER — Encounter: Payer: Self-pay | Admitting: Pharmacist Clinician (PhC)/ Clinical Pharmacy Specialist

## 2020-10-27 ENCOUNTER — Telehealth: Payer: Self-pay | Admitting: Pharmacist Clinician (PhC)/ Clinical Pharmacy Specialist

## 2020-10-27 DIAGNOSIS — E782 Mixed hyperlipidemia: Secondary | ICD-10-CM

## 2020-10-27 MED ORDER — REPATHA SURECLICK 140 MG/ML ~~LOC~~ SOAJ: 140.0000 mg | SUBCUTANEOUS | 11 refills | Status: DC

## 2020-10-27 NOTE — Telephone Encounter (Signed)
Called and spoke to pt and stated that they were approved for repatha, rx sent, pt instructed to complete fasting labs post 4th dose and they voiced understanding

## 2020-10-27 NOTE — Telephone Encounter (Signed)
Courtney [name] - please do PA for Repatha 140 mg q14d and once she is on medication she will need lipid and liver function labs drawn after 2-3 months.

## 2020-10-27 NOTE — Assessment & Plan Note (Addendum)
Patient with ASCVD and hyperlipidemia, currently not at goal LDL despite maximum doses of rosuvastatin and ezetimibe.  Reviewed options for lowering LDL cholesterol, including ezetimibe, PCSK-9 inhibitors, bempedoic acid and inclisiran.  Discussed mechanisms of action, dosing, side effects and potential decreases in LDL cholesterol.  Also reviewed cost information and potential options for patient assistance.  Answered all patient questions.  Based on this information, patient would prefer to start PCSK-9 inhibitor.  She was shown injection technique with the pen in office today and given one sample to try at home.  We will have use Repatha 140 mg every 14 days and repeat lipid and liver function labs after 2-3 month on medication.    Will have her discontinue fenofibrate, as her triglyceride levels are excellent.

## 2020-10-27 NOTE — Telephone Encounter (Signed)
Pa for repatha '140mg'$  q14d submitted. [facility] (Key: [id number]) Lipid and hepatic ordered and released

## 2020-10-27 NOTE — Addendum Note (Signed)
Addended by: Allean Found on: 10/27/2020 04:28 PM   Modules accepted: Orders

## 2020-10-28 ENCOUNTER — Telehealth: Payer: Medicare HMO | Admitting: Nurse Practitioner

## 2020-10-28 DIAGNOSIS — R059 Cough, unspecified: Secondary | ICD-10-CM | POA: Diagnosis not present

## 2020-10-28 DIAGNOSIS — U071 COVID-19: Secondary | ICD-10-CM | POA: Diagnosis not present

## 2020-10-28 MED ORDER — PROMETHAZINE-DM 6.25-15 MG/5ML PO SYRP: 5.0000 mL | ORAL_SOLUTION | Freq: Four times a day (QID) | ORAL | 0 refills | Status: DC | PRN

## 2020-10-28 NOTE — Progress Notes (Signed)
E-Visit  for Positive Covid Test Result  We are sorry you are not feeling well. We are here to help!  You have tested positive for COVID-19, meaning that you were infected with the novel coronavirus and could give the virus to others.  It is vitally important that you stay home so you do not spread it to others.      Spoke with patient on phone and she does not want antiviral at this time. She will make a video  appointment if she changes her mind.  Please continue isolation at home, for at least 10 days since the start of your symptoms and until you have had 24 hours with no fever (without taking a fever reducer) and with improving of symptoms.  If you have no symptoms but tested positive (or all symptoms resolve after 5 days and you have no fever) you can leave your house but continue to wear a mask around others for an additional 5 days. If you have a fever,continue to stay home until you have had 24 hours of no fever. Most cases improve 5-10 days from onset but we have seen a small number of patients who have gotten worse after the 10 days.  Please be sure to watch for worsening symptoms and remain taking the proper precautions.   Go to the nearest hospital ED for assessment if fever/cough/breathlessness are severe or illness seems like a threat to life.    The following symptoms may appear 2-14 days after exposure: Fever Cough Shortness of breath or difficulty breathing Chills Repeated shaking with chills Muscle pain Headache Sore throat New loss of taste or smell Fatigue Congestion or runny nose Nausea or vomiting Diarrhea  You have been enrolled in [facility] for COVID-19. Daily you will receive a questionnaire within the [facility] website. Our COVID-19 response team will be monitoring your responses daily.  You can use medication such as prescription cough medication called Phenergan DM 6.25 mg/15 mg. You make take one teaspoon / 5 ml every 4-6 hours as needed  for cough  You may also take acetaminophen (Tylenol) as needed for fever.  HOME CARE: Only take medications as instructed by your medical team. Drink plenty of fluids and get plenty of rest. A steam or ultrasonic humidifier can help if you have congestion.   GET HELP RIGHT AWAY IF YOU HAVE EMERGENCY WARNING SIGNS.  Call 911 or proceed to your closest emergency facility if: You develop worsening high fever. Trouble breathing Bluish lips or face Persistent pain or pressure in the chest New confusion Inability to wake or stay awake You cough up blood. Your symptoms become more severe Inability to hold down food or fluids  This list is not all possible symptoms. Contact your medical provider for any symptoms that are severe or concerning to you.    Your e-visit answers were reviewed by a board certified advanced clinical practitioner to complete your personal care plan.  Depending on the condition, your plan could have included both over the counter or prescription medications.  If there is a problem please reply once you have received a response from your provider.  Your safety is important to [place].  If you have drug allergies check your prescription carefully.    You can use MyChart to ask questions about today's visit, request a non-urgent call back, or ask for a work or school excuse for 24 hours related to this e-Visit. If it has been greater than 24 hours you  will need to follow up with your provider, or enter a new e-Visit to address those concerns. You will get an e-mail in the next two days asking about your experience.  I hope that your e-visit has been valuable and will speed your recovery. Thank you for using e-visits.   5-10 minutes spent reviewing and documenting in chart.

## 2020-10-30 ENCOUNTER — Telehealth: Payer: Medicare HMO | Admitting: Physician Assistant

## 2020-10-30 DIAGNOSIS — U071 COVID-19: Secondary | ICD-10-CM

## 2020-10-30 MED ORDER — BENZONATATE 100 MG PO CAPS
100.0000 mg | ORAL_CAPSULE | Freq: Three times a day (TID) | ORAL | 0 refills | Status: DC | PRN
Start: 2020-10-30 — End: 2020-12-12

## 2020-10-30 MED ORDER — PROMETHAZINE-DM 6.25-15 MG/5ML PO SYRP: 5.0000 mL | ORAL_SOLUTION | Freq: Four times a day (QID) | ORAL | 0 refills | Status: DC | PRN

## 2020-10-30 MED ORDER — MOLNUPIRAVIR EUA 200MG CAPSULE: 4.0000 | ORAL_CAPSULE | Freq: Two times a day (BID) | ORAL | 0 refills | Status: AC

## 2020-10-30 MED ORDER — PREDNISONE 20 MG PO TABS: 40.0000 mg | ORAL_TABLET | Freq: Every day | ORAL | 0 refills | Status: DC

## 2020-10-30 NOTE — Progress Notes (Signed)
Virtual Visit Consent   Courtney [name], you are scheduled for a virtual visit with a [facility] provider today.     Just as with appointments in the office, your consent must be obtained to participate.  Your consent will be active for this visit and any virtual visit you may have with one of our providers in the next 365 days.     If you have a MyChart account, a copy of this consent can be sent to you electronically.  All virtual visits are billed to your insurance company just like a traditional visit in the office.    As this is a virtual visit, video technology does not allow for your provider to perform a traditional examination.  This may limit your provider's ability to fully assess your condition.  If your provider identifies any concerns that need to be evaluated in person or the need to arrange testing (such as labs, EKG, etc.), we will make arrangements to do so.     Although advances in technology are sophisticated, we cannot ensure that it will always work on either your end or our end.  If the connection with a video visit is poor, the visit may have to be switched to a telephone visit.  With either a video or telephone visit, we are not always able to ensure that we have a secure connection.     I need to obtain your verbal consent now.   Are you willing to proceed with your visit today?    Courtney [name] has provided verbal consent on 10/30/2020 for a virtual visit (video or telephone).   Mar Daring, PA-C   Date: 10/30/2020 1:37 PM   Virtual Visit via Video Note   I, Mar Daring, connected with  Courtney [name]  ([id number], Dec 26, 1950) on 10/30/20 at  1:15 PM EDT by a video-enabled telemedicine application and verified that I am speaking with the correct person using two identifiers.  Location: Patient: Virtual Visit Location Patient: Home Provider: Virtual Visit Location Provider: Home Office   I discussed the limitations of evaluation and management by  telemedicine and the availability of in person appointments. The patient expressed understanding and agreed to proceed.    History of Present Illness: Courtney [name] is a 70 y.o. who identifies as a female who was assigned female at birth, and is being seen today for Covid 76.  HPI: URI  This is a new problem. Episode onset: tested positive for covid 19 on 10/27/20, symptoms started late Thursday evening on 10/26/20. The problem has been gradually worsening. There has been no fever (99-100). Associated symptoms include congestion, coughing, diarrhea (looser stools), headaches, nausea, rhinorrhea, sinus pain and wheezing. Pertinent negatives include no sore throat (improved) or vomiting. Associated symptoms comments: Body aches improving, fatigue.     Problems:  Patient Active Problem List   Diagnosis Date Noted   Degenerative disc disease, lumbar 05/19/2020   Restless leg syndrome 05/15/2020   Metatarsal stress fracture of left foot 04/07/2020   Degenerative disc disease, cervical 02/02/2020   Polyarthralgia 01/03/2020   Patellofemoral arthritis of right knee 06/16/2019   Vitamin D deficiency, Vit D = 43.2 (01/25/19), Rx vit D 50K IU every 2 weeks 08/04/2018   Cyst of right breast 05/07/2018   Chronic headache disorder 02/08/2018   Insomnia 07/15/2017   Female stress incontinence 07/15/2017   Gastroesophageal reflux disease with esophagitis, Rx Protonix 02/25/2017   Osteopenia 06/27/2015   Osteoarthritis of right hip  07/08/2014   Carotid artery disease (HCC)    Aortic valve sclerosis    Mixed hyperlipidemia, Rx Crestor and Zetia    Coronary artery disease     Allergies:  Allergies  Allergen Reactions   Erythromycin Hives   Penicillins Hives    Has patient had a PCN reaction causing immediate rash, facial/tongue/throat swelling, SOB or lightheadedness with hypotension: No Has patient had a PCN reaction causing severe rash involving mucus membranes or skin necrosis: No Has patient had a  PCN reaction that required hospitalization: No Has patient had a PCN reaction occurring within the last 10 years: No If all of the above answers are "NO", then may proceed with Cephalosporin use.    Shellfish Allergy Hives   Medications:  Current Outpatient Medications:    benzonatate (TESSALON) 100 MG capsule, Take 1 capsule (100 mg total) by mouth 3 (three) times daily as needed., Disp: 30 capsule, Rfl: 0   molnupiravir EUA 200 mg CAPS, Take 4 capsules (800 mg total) by mouth 2 (two) times daily for 5 days., Disp: 40 capsule, Rfl: 0   predniSONE (DELTASONE) 20 MG tablet, Take 2 tablets (40 mg total) by mouth daily with breakfast., Disp: 10 tablet, Rfl: 0   acetaminophen (TYLENOL) 500 MG tablet, Take 1,000 mg by mouth every 6 (six) hours as needed (Pain)., Disp: , Rfl:    aspirin 81 MG tablet, Take 81 mg by mouth daily., Disp: , Rfl:    dicyclomine (BENTYL) 20 MG tablet, Take 1 tablet (20 mg total) by mouth every 6 (six) hours as needed for spasms., Disp: 180 tablet, Rfl: 0   Evolocumab (REPATHA SURECLICK) [id number] MG/ML SOAJ, Inject 140 mg into the skin every 14 (fourteen) days., Disp: 2 mL, Rfl: 11   ezetimibe (ZETIA) 10 MG tablet, Take 1 tablet (10 mg total) by mouth daily., Disp: 90 tablet, Rfl: 3   gabapentin (NEURONTIN) 300 MG capsule, Take 1 capsule (300 mg total) by mouth at bedtime., Disp: 90 capsule, Rfl: 3   Melatonin 10 MG TABS, Take by mouth., Disp: , Rfl:    meloxicam (MOBIC) 15 MG tablet, Take 1 tablet (15 mg total) by mouth daily., Disp: 30 tablet, Rfl: 0   metoprolol tartrate (LOPRESSOR) 25 MG tablet, Take 0.5 tablets (12.5 mg total) by mouth 2 (two) times daily., Disp: 90 tablet, Rfl: 3   nitroGLYCERIN (NITROSTAT) 0.4 MG SL tablet, Place 1 tablet (0.4 mg total) under the tongue every 5 (five) minutes as needed for chest pain., Disp: 25 tablet, Rfl: 11   pantoprazole (PROTONIX) 40 MG tablet, Take 1 tablet (40 mg total) by mouth daily., Disp: 90 tablet, Rfl: 3   Passion  Flower-Valerian 500-500 MG CAPS, Take 1 capsule by mouth as needed., Disp: , Rfl:    promethazine-dextromethorphan (PROMETHAZINE-DM) 6.25-15 MG/5ML syrup, Take 5 mLs by mouth 4 (four) times daily as needed for cough., Disp: 118 mL, Rfl: 0   ramipril (ALTACE) 10 MG capsule, Take 1 capsule (10 mg total) by mouth daily., Disp: 90 capsule, Rfl: 3   rosuvastatin (CRESTOR) 40 MG tablet, Take 1 tablet (40 mg total) by mouth daily., Disp: 90 tablet, Rfl: 3   Vitamin D, Ergocalciferol, (DRISDOL) 1.25 MG (50000 UNIT) CAPS capsule, Take 1 capsule (50,000 Units total) by mouth every 7 (seven) days., Disp: 4 capsule, Rfl: 0  Observations/Objective: Patient is well-developed, well-nourished in no acute distress.  Resting comfortably at home.  Head is normocephalic, atraumatic.  No labored breathing.  Speech is clear and coherent with  logical content.  Patient is alert and oriented at baseline.  Dry, hacking, bark-like cough heard x 1  Assessment and Plan: 1. COVID-19 - molnupiravir EUA 200 mg CAPS; Take 4 capsules (800 mg total) by mouth 2 (two) times daily for 5 days.  Dispense: 40 capsule; Refill: 0 - predniSONE (DELTASONE) 20 MG tablet; Take 2 tablets (40 mg total) by mouth daily with breakfast.  Dispense: 10 tablet; Refill: 0 - promethazine-dextromethorphan (PROMETHAZINE-DM) 6.25-15 MG/5ML syrup; Take 5 mLs by mouth 4 (four) times daily as needed for cough.  Dispense: 118 mL; Refill: 0 - benzonatate (TESSALON) 100 MG capsule; Take 1 capsule (100 mg total) by mouth 3 (three) times daily as needed.  Dispense: 30 capsule; Refill: 0  - Continue OTC symptomatic management of choice - Will send OTC vitamins and supplement information through [facility] - Molnupiravir prescribed - Tessalon perles, Promethazine DM and prednisone prescribed for symptomatic management - Patient enrolled in MyChart symptom monitoring - Push fluids - Rest as needed - Discussed return precautions and when to seek in-person  evaluation, sent via [facility] as well   Follow Up Instructions: I discussed the assessment and treatment plan with the patient. The patient was provided an opportunity to ask questions and all were answered. The patient agreed with the plan and demonstrated an understanding of the instructions.  A copy of instructions were sent to the patient via MyChart.  The patient was advised to call back or seek an in-person evaluation if the symptoms worsen or if the condition fails to improve as anticipated.  Time:  I spent 16 minutes with the patient via telehealth technology discussing the above problems/concerns.    Mar Daring, PA-C

## 2020-10-30 NOTE — Patient Instructions (Signed)
[place] are being placed in the home monitoring program for COVID-19 (commonly known as Coronavirus).  This is because you are suspected to have the virus or are known to have the virus.  If you are unsure which group you fall into call your clinic.    As part of this program, you'll answer a daily questionnaire in the MyChart mobile app. You'll receive a notification through the MyChart app when the questionnaire is available. When you log in to MyChart, you'll see the tasks in your To Do activity.       Clinicians will see any answers that are concerning and take appropriate steps.  If at any point you are having a medical emergency, call 911.  If otherwise concerned call your clinic instead of coming into the clinic or hospital.  To keep from spreading the disease you should: Stay home and limit contact with other people as much as possible.  Wash your hands frequently. Cover your coughs and sneezes with a tissue, and throw used tissues in the trash.   Clean and disinfect frequently touched surfaces and objects.    Take care of yourself by: Staying home Resting Drinking fluids Take fever-reducing medications (Tylenol/Acetaminophen and Ibuprofen)  For more information on the disease go to the Centers for Disease Control and Prevention website     You are being prescribed MOLNUPIRAVIR for COVID-19 infection.   Please call the pharmacy or go through the drive through vs going inside if you are picking up the mediation yourself to prevent further spread. If prescribed to a [facility] affiliated pharmacy, a pharmacist will bring the medication out to your car.   ADMINISTRATION INSTRUCTIONS: Take with or without food. Swallow the tablets whole. Don't chew, crush, or break the medications because it might not work as well  For each dose of the medication, you should be taking FOUR tablets at one time, TWICE a day   Finish your full five-day course of Molnupiravir even if you  feel better before you're done. Stopping this medication too early can make it less effective to prevent severe illness related to [place].    Molnupiravir is prescribed for YOU ONLY. Don't share it with others, even if they have similar symptoms as you. This medication might not be right for everyone.   Make sure to take steps to protect yourself and others while you're taking this medication in order to get well soon and to prevent others from getting sick with COVID-19.   **If you are of childbearing potential (any gender) - it is advised to not get pregnant while taking this medication and recommended that condoms are used for female partners the next 3 months after taking the medication out of extreme caution    COMMON SIDE EFFECTS: Diarrhea Nausea  Dizziness    If your COVID-19 symptoms get worse, get medical help right away. Call 911 if you experience symptoms such as worsening cough, trouble breathing, chest pain that doesn't go away, confusion, a hard time staying awake, and pale or blue-colored skin. This medication won't prevent all COVID-19 cases from getting worse.   Can take to lessen severity: Vit C '500mg'$  twice daily Quercertin 250-'500mg'$  twice daily Zinc 75-'100mg'$  daily Melatonin 3-6 mg at bedtime Vit D3 1000-2000 IU daily Aspirin 81 mg daily with food Optional: Famotidine '20mg'$  daily Also can add tylenol/ibuprofen as needed for fevers and body aches May add Mucinex or Mucinex DM as needed for cough/congestion  10 Things You Can Do  to Manage Your COVID-19 Symptoms at Home If you have possible or confirmed COVID-19 Stay home except to get medical care. Monitor your symptoms carefully. If your symptoms get worse, call your healthcare provider immediately. Get rest and stay hydrated. If you have a medical appointment, call the healthcare provider ahead of time and tell them that you have or may have COVID-19. For medical emergencies, call 911 and notify the dispatch  personnel that you have or may have COVID-19. Cover your cough and sneezes with a tissue or use the inside of your elbow. Wash your hands often with soap and water for at least 20 seconds or clean your hands with an alcohol-based hand sanitizer that contains at least 60% alcohol. As much as possible, stay in a specific room and away from other people in your home. Also, you should use a separate bathroom, if available. If you need to be around other people in or outside of the home, wear a mask. Avoid sharing personal items with other people in your household, like dishes, towels, and bedding. Clean all surfaces that are touched often, like counters, tabletops, and doorknobs. Use household cleaning sprays or wipes according to the label instructions. cdc.gov/coronavirus 09/03/2019 This information is not intended to replace advice given to you by your health care provider. Make sure you discuss any questions you have with your health care provider. Document Revised: 06/22/2020 Document Reviewed: 06/22/2020 Elsevier Patient Education  2022 Elsevier Inc.  

## 2020-11-02 ENCOUNTER — Encounter (INDEPENDENT_AMBULATORY_CARE_PROVIDER_SITE_OTHER): Payer: Self-pay | Admitting: Family Medicine

## 2020-11-02 ENCOUNTER — Other Ambulatory Visit: Payer: Self-pay

## 2020-11-02 ENCOUNTER — Ambulatory Visit: Payer: Medicare HMO

## 2020-11-02 ENCOUNTER — Telehealth (INDEPENDENT_AMBULATORY_CARE_PROVIDER_SITE_OTHER): Payer: Medicare HMO | Admitting: Family Medicine

## 2020-11-02 VITALS — Wt 151.0 lb

## 2020-11-02 DIAGNOSIS — E669 Obesity, unspecified: Secondary | ICD-10-CM | POA: Diagnosis not present

## 2020-11-02 DIAGNOSIS — E7849 Other hyperlipidemia: Secondary | ICD-10-CM | POA: Diagnosis not present

## 2020-11-02 DIAGNOSIS — E559 Vitamin D deficiency, unspecified: Secondary | ICD-10-CM | POA: Diagnosis not present

## 2020-11-02 MED ORDER — REPATHA SURECLICK 140 MG/ML ~~LOC~~ SOAJ: 140.0000 mg | SUBCUTANEOUS | 3 refills | Status: DC

## 2020-11-02 MED ORDER — VITAMIN D (ERGOCALCIFEROL) 1.25 MG (50000 UNIT) PO CAPS: 50000.0000 [IU] | ORAL_CAPSULE | ORAL | 0 refills | Status: DC

## 2020-11-02 NOTE — Progress Notes (Signed)
TeleHealth Visit:  Due to the COVID-19 pandemic, this visit was completed with telemedicine (audio/video) technology to reduce patient and provider exposure as well as to preserve personal protective equipment.   Courtney [name] has verbally consented to this TeleHealth visit. The patient is located at home, the provider is located at the [facility] office. The participants in this visit include the listed provider and patient. The visit was conducted today via MyChart video.   Chief Complaint: OBESITY Courtney [name] is here to discuss her progress with her obesity treatment plan along with follow-up of her obesity related diagnoses. Courtney [name] is on the Category 1 Plan and states she is following her eating plan approximately 85% of the time. Courtney [name] states she is doing 0 minutes 0 times per week.  Today's visit was #: 58 Starting weight: 157 lbs Starting date: 06/16/2017  Interim History: Courtney [name] hasn't been able to follow her plan well due to her ongoing COVID illness. She is able to dink and she is trying to hydrate but her appetite has been low. She has started to feel better yesterday and she is trying to eat a bit more now.  Subjective:   1. Vitamin D deficiency Courtney [name] is stable on Vit D, and she denies nausea, vomiting, or muscle weakness.  2. Other hyperlipidemia Courtney [name] recently was changed to [place]. She has been following a low cholesterol diet for quite a while and her elevated LDL appears to be more genetic.  Assessment/Plan:   1. Vitamin D deficiency Low Vitamin D level contributes to fatigue and are associated with obesity, breast, and colon cancer. We will refill prescription Vitamin D for 90 days. We will recheck labs in 1-2 months. Courtney [name] will follow-up for routine testing of Vitamin D, at least 2-3 times per year to avoid over-replacement.  - Vitamin D, Ergocalciferol, (DRISDOL) 1.25 MG (50000 UNIT) CAPS capsule; Take 1 capsule (50,000 Units total) by mouth every 7 (seven)  days.  Dispense: 12 capsule; Refill: 0  2. Other hyperlipidemia Cardiovascular risk and specific lipid/LDL goals reviewed. We discussed several lifestyle modifications today. Courtney [name] will continue Repatha as is, and will continue with diet, exercise and weight loss efforts. We will recheck labs in 1-2 months. Orders and follow up as documented in patient record.   3. Obesity with current BMI 28.7 Courtney [name] is currently in the action stage of change. As such, her goal is to continue with weight loss efforts. She has agreed to the Category 1 Plan.   Courtney [name] is to slowly increase her protein as tolerated to help decrease the drop in her metabolism.  Exercise goals: None until her coughing resolves.  Behavioral modification strategies: increasing lean protein intake.  Courtney [name] has agreed to follow-up with our clinic in 4 weeks. She was informed of the importance of frequent follow-up visits to maximize her success with intensive lifestyle modifications for her multiple health conditions.  Objective:   VITALS: Per patient if applicable, see vitals. GENERAL: Alert and in no acute distress. CARDIOPULMONARY: No increased WOB. Speaking in clear sentences.  PSYCH: Pleasant and cooperative. Speech normal rate and rhythm. Affect is appropriate. Insight and judgement are appropriate. Attention is focused, linear, and appropriate.  NEURO: Oriented as arrived to appointment on time with no prompting.   Lab Results  Component Value Date   CREATININE 1.09 10/17/2020   BUN 27 (H) 10/17/2020   NA 138 10/17/2020   K 4.0 10/17/2020   CL 105 10/17/2020   CO2 27 10/17/2020   Lab  Results  Component Value Date   ALT 18 10/17/2020   AST 31 10/17/2020   ALKPHOS 46 10/17/2020   BILITOT 0.4 10/17/2020   Lab Results  Component Value Date   HGBA1C 5.8 (H) 08/30/2020   HGBA1C 5.8 (H) 03/30/2020   HGBA1C 5.6 12/01/2019   HGBA1C 5.7 (H) 08/04/2019   HGBA1C 5.4 09/14/2018   Lab Results  Component Value Date    INSULIN 6.8 08/30/2020   INSULIN 9.4 03/30/2020   INSULIN 7.2 12/01/2019   INSULIN 4.9 08/04/2019   INSULIN 4.2 09/14/2018   Lab Results  Component Value Date   TSH 1.75 01/03/2020   Lab Results  Component Value Date   CHOL 156 10/25/2020   HDL 67 10/25/2020   LDLCALC 76 10/25/2020   TRIG 66 10/25/2020   CHOLHDL 2.3 10/25/2020   Lab Results  Component Value Date   VD25OH 64.5 08/30/2020   VD25OH 62.7 03/30/2020   VD25OH 68.5 12/01/2019   Lab Results  Component Value Date   WBC 7.3 01/03/2020   HGB 13.0 01/03/2020   HCT 37.7 01/03/2020   MCV 91.5 01/03/2020   PLT 196.0 01/03/2020   Lab Results  Component Value Date   IRON 117 10/17/2020   TIBC 431.2 10/17/2020   FERRITIN 133.2 10/17/2020    Attestation Statements:   Reviewed by clinician on day of visit: allergies, medications, problem list, medical history, surgical history, family history, social history, and previous encounter notes.   I, Trixie Dredge, am acting as transcriptionist for Dennard Nip, MD.  I have reviewed the above documentation for accuracy and completeness, and I agree with the above. - Dennard Nip, MD

## 2020-11-06 ENCOUNTER — Other Ambulatory Visit: Payer: Self-pay

## 2020-11-06 MED ORDER — REPATHA SURECLICK 140 MG/ML ~~LOC~~ SOAJ
140.0000 mg | SUBCUTANEOUS | 3 refills | Status: DC
Start: 2020-11-06 — End: 2020-12-05

## 2020-11-08 ENCOUNTER — Telehealth: Payer: Self-pay

## 2020-11-08 NOTE — Telephone Encounter (Signed)
Pt called in and stated that they were having side effects on the repatha but it was left on the voicemail and she didn't indicate what exactly they were causing. Routing to pharmd pool for advisement.

## 2020-11-08 NOTE — Telephone Encounter (Signed)
Felt "zombie-like" starting on Sunday after Friday dose.   That was her first dose.   Lasted thru Wednesday, now constipation with lower abdominal pain.  She is not interested in seeing if symptoms lessen with further doses, nor is she willing to consider sample of Praluent at this time.  Advised that she take some time to get over her symptoms, then in a few weeks she can call if she is interested in Praluent or Nexletol.   Patient agreeable to plan.

## 2020-12-05 ENCOUNTER — Ambulatory Visit (INDEPENDENT_AMBULATORY_CARE_PROVIDER_SITE_OTHER): Payer: Medicare HMO | Admitting: Family Medicine

## 2020-12-05 ENCOUNTER — Encounter (INDEPENDENT_AMBULATORY_CARE_PROVIDER_SITE_OTHER): Payer: Self-pay | Admitting: Family Medicine

## 2020-12-05 ENCOUNTER — Other Ambulatory Visit: Payer: Self-pay

## 2020-12-05 VITALS — BP 126/66 | HR 50 | Temp 97.6°F | Ht 60.0 in | Wt 152.0 lb

## 2020-12-05 DIAGNOSIS — R5383 Other fatigue: Secondary | ICD-10-CM

## 2020-12-05 DIAGNOSIS — E669 Obesity, unspecified: Secondary | ICD-10-CM | POA: Diagnosis not present

## 2020-12-05 NOTE — Progress Notes (Signed)
Chief Complaint:   OBESITY Prima is here to discuss her progress with her obesity treatment plan along with follow-up of her obesity related diagnoses. Nova is on the Category 1 Plan and states she is following her eating plan approximately 75% of the time. Jayliana states she is doing 0 minutes 0 times per week.  Today's visit was #: 1 Starting weight: 157 lbs Starting date: 06/16/2017 Today's weight: 152 lbs Today's date: 12/05/2020 Total lbs lost to date: 5 Total lbs lost since last in-office visit: 0  Interim History: Del has had a tough 1-2 months with multiple health issues which has made following her meal plan difficult. She had extreme fatigue and loss of taste. She is still very fatigued and she hasn't been able to exercise as much. She is working on getting back on track.  Subjective:   1. Other fatigue Lateshia notes lingering fatigue while recovering from [place]. She is not sleeping as well at night, and she hasn't been exercising as much and her protein has decreased.  Assessment/Plan:   1. Other fatigue Caytlin was encouraged to slowly add even a little bit of walking (maybe 10 minutes) most days, and work on getting her protein back. We will follow up in 1 month.  2. Obesity with current BMI 29.8 Amali is currently in the action stage of change. As such, her goal is to continue with weight loss efforts. She has agreed to the Category 1 Plan.   Behavioral modification strategies: increasing lean protein intake and increasing water intake.  Andriea has agreed to follow-up with our clinic in 4 weeks. She was informed of the importance of frequent follow-up visits to maximize her success with intensive lifestyle modifications for her multiple health conditions.   Objective:   Blood pressure 126/66, pulse (!) 50, temperature 97.6 F (36.4 C), height 5' (1.524 m), weight 152 lb (68.9 kg), SpO2 98 %. Body mass index is 29.69 kg/m.  General: Cooperative, alert, well  developed, in no acute distress. HEENT: Conjunctivae and lids unremarkable. Cardiovascular: Regular rhythm.  Lungs: Normal work of breathing. Neurologic: No focal deficits.   Lab Results  Component Value Date   CREATININE 1.09 10/17/2020   BUN 27 (H) 10/17/2020   NA 138 10/17/2020   K 4.0 10/17/2020   CL 105 10/17/2020   CO2 27 10/17/2020   Lab Results  Component Value Date   ALT 18 10/17/2020   AST 31 10/17/2020   ALKPHOS 46 10/17/2020   BILITOT 0.4 10/17/2020   Lab Results  Component Value Date   HGBA1C 5.8 (H) 08/30/2020   HGBA1C 5.8 (H) 03/30/2020   HGBA1C 5.6 12/01/2019   HGBA1C 5.7 (H) 08/04/2019   HGBA1C 5.4 09/14/2018   Lab Results  Component Value Date   INSULIN 6.8 08/30/2020   INSULIN 9.4 03/30/2020   INSULIN 7.2 12/01/2019   INSULIN 4.9 08/04/2019   INSULIN 4.2 09/14/2018   Lab Results  Component Value Date   TSH 1.75 01/03/2020   Lab Results  Component Value Date   CHOL 156 10/25/2020   HDL 67 10/25/2020   LDLCALC 76 10/25/2020   TRIG 66 10/25/2020   CHOLHDL 2.3 10/25/2020   Lab Results  Component Value Date   VD25OH 64.5 08/30/2020   VD25OH 62.7 03/30/2020   VD25OH 68.5 12/01/2019   Lab Results  Component Value Date   WBC 7.3 01/03/2020   HGB 13.0 01/03/2020   HCT 37.7 01/03/2020   MCV 91.5 01/03/2020   PLT  196.0 01/03/2020   Lab Results  Component Value Date   IRON 117 10/17/2020   TIBC 431.2 10/17/2020   FERRITIN 133.2 10/17/2020   Attestation Statements:   Reviewed by clinician on day of visit: allergies, medications, problem list, medical history, surgical history, family history, social history, and previous encounter notes.  Time spent on visit including pre-visit chart review and post-visit care and charting was 30 minutes.    I, Trixie Dredge, am acting as transcriptionist for Dennard Nip, MD.  I have reviewed the above documentation for accuracy and completeness, and I agree with the above. -  Dennard Nip,  MD

## 2020-12-08 NOTE — Progress Notes (Signed)
[place] Phone: [phone] Subjective:   Courtney [name], am serving as a scribe for Dr. Hulan Saas. This visit occurred during the SARS-CoV-2 public health emergency.  Safety protocols were in place, including screening questions prior to the visit, additional usage of staff PPE, and extensive cleaning of exam room while observing appropriate contact time as indicated for disinfecting solutions.   I'm seeing this patient by the request  of:  Marrian Salvage, FNP  CC: Foot exam follow-up  JJK:[id number]  10/17/2020 Patient has been diagnosed with restless leg syndrome.  He has most mornings when she starts having increasing discomfort in the calf.  He did have recent travel and will get a D-dimer, has had labs that did show dehydration we discussed monitoring this, in addition to this we also discussed a potential sleep study.  We will order this to see if anything else is contributing.  Patient will follow up with me again in 2 to 3 months otherwise.  Foot pain is significantly improved.  Ultrasound was ordered but not completed.  Patient is doing better with [name] significant pain.  Can increase activity as tolerated.  Gabapentin has helped significantly at night.  Patient can increase activity  Update 12/11/2020 Courtney [name] is a 70 y.o. female coming in with complaint of L foot pain. Patient states that her foot pain has subsided.  Has not had any pain.  Has not pushed.  Still not walking long distances but is looking forward to it soon.  Continues to use gabapentin which his helping her sleep.  Using 371m of gabapentin.      Past Medical History:  Diagnosis Date   Aortic valve sclerosis    echo 171/6967 soft systolic murmur   Arthritis    in right hip   Back pain    Basal cell carcinoma 05/31/2016   bcc left forehead  tx exc   Carotid artery disease (HCC)    40-59% bilateral, doppler December  2010   Cataract    Constipation    Coronary artery disease    a. BMS-mid RCA 11/2007 b. stress echo 02/2009 subtle inferior HK, lateral ST depressions with stress c. follow-up cath 02/2009: RCA stent patent, severe but stable stenosis of distal posterior lateral branch RCA. LV normal, med tx unless more sx c. ETT Myoview 08/27/12: negative for ischemia; EF 64%   Decreased hearing    Depression    Mild, August, 2012   Dyslipidemia    Easy bruising    Ejection fraction    EF normal, catheterization, 2011 /  EF 55%, echo, 2010   Fluid overload    Mild, stable since hospitalization in the past   GERD (gastroesophageal reflux disease)    occarionally, takes Pepcid over the counter   Heart murmur    has, occasional PVC's   History of right hip replacement    Hyperlipidemia    Hypertension    Joint pain    [place]   Palpitations    PVC (premature ventricular contraction)    per prior Holter monitor   Right hip pain 12/2009   Shellfish allergy    Stress fracture    Left foot   Trouble in sleeping    Vitamin B12 deficiency    Vitamin D deficiency    Past Surgical History:  Procedure Laterality Date   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/01/2005   BREAST BIOPSY Left  carcinoid tumor removal     colon-   CARDIAC CATHETERIZATION  1/211   Patent RCA stent, stenotic PLB supplying small distribution; medically managed   CATARACT EXTRACTION, BILATERAL     CORONARY ANGIOPLASTY WITH STENT PLACEMENT  11/2007   BMS-mid RCA   DIAGNOSTIC LAPAROSCOPY     1982 for endometriosis   DILATION AND CURETTAGE OF UTERUS  2005   SKIN CANCER EXCISION     TOTAL HIP ARTHROPLASTY Right 07/08/2014   Procedure: RIGHT TOTAL HIP ARTHROPLASTY ANTERIOR APPROACH;  Surgeon: Mcarthur Rossetti, MD;  Location: [facility];  Service: Orthopedics;  Laterality: Right;   WISDOM TOOTH EXTRACTION     Social History   Socioeconomic History   Marital status: Divorced    Spouse name: Not on file    Number of children: 1   Years of education: 60   Highest education level: Not on file  Occupational History   Occupation: Retired - Retail buyer  Tobacco Use   Smoking status: Never   Smokeless tobacco: Never  Vaping Use   Vaping Use: Never used  Substance and Sexual Activity   Alcohol use: Yes    Comment: 1 glass of wine 3x/week   Drug use: [name]   Sexual activity: Yes  Other Topics Concern   Not on file  Social History Narrative   Right handed   Soda- sometimes   Drinks herbal tea   Fun: Dance, garden,    Denies religious beliefs effecting health care.   Denies abuse and feels safe at home   Social Determinants of Health   Financial Resource Strain: Low Risk    Difficulty of Paying Living Expenses: Not hard at all  Food Insecurity: [name] Food Insecurity   Worried About Charity fundraiser in the Last Year: Never true   Hazard in the Last Year: Never true  Transportation Needs: [name] Transportation Needs   Lack of Transportation (Medical): [name]   Lack of Transportation (Non-Medical): [name]  Physical Activity: Sufficiently Active   Days of Exercise per Week: 5 days   Minutes of Exercise per Session: 30 min  Stress: [name] Stress Concern Present   Feeling of Stress : Not at all  Social Connections: Moderately Isolated   Frequency of Communication with Friends and Family: More than three times a week   Frequency of Social Gatherings with Friends and Family: More than three times a week   Attends Religious Services: Never   Marine scientist or Organizations: [name]   Attends Music therapist: Never   Marital Status: Living with partner   Allergies  Allergen Reactions   Erythromycin Hives   Penicillins Hives    Has patient had a PCN reaction causing immediate rash, facial/tongue/throat swelling, SOB or lightheadedness with hypotension: [name] Has patient had a PCN reaction causing severe rash involving mucus membranes or skin necrosis: [name] Has patient had a PCN  reaction that required hospitalization: [name] Has patient had a PCN reaction occurring within the last 10 years: [name] If all of the above answers are "[name]", then may proceed with Cephalosporin use.    Shellfish Allergy Hives   Family History  Problem Relation Age of Onset   Heart attack Father 67   Hyperlipidemia Father    Hypertension Father    Sudden death Father    Stroke Mother    Hyperlipidemia Mother    Hypertension Mother    Heart disease Mother    Obesity Mother    Heart  attack Paternal Grandfather    Heart disease Sister    BRCA 1/2 Neg Hx    Breast cancer Neg Hx    Esophageal cancer Neg Hx    Colon cancer Neg Hx    Pancreatic cancer Neg Hx    Liver disease Neg Hx    Stomach cancer Neg Hx     Current Outpatient Medications (Endocrine & Metabolic):    predniSONE (DELTASONE) 20 MG tablet, Take 2 tablets (40 mg total) by mouth daily with breakfast.  Current Outpatient Medications (Cardiovascular):    ezetimibe (ZETIA) 10 MG tablet, Take 1 tablet (10 mg total) by mouth daily.   metoprolol tartrate (LOPRESSOR) 25 MG tablet, Take 0.5 tablets (12.5 mg total) by mouth 2 (two) times daily.   nitroGLYCERIN (NITROSTAT) 0.4 MG SL tablet, Place 1 tablet (0.4 mg total) under the tongue every 5 (five) minutes as needed for chest pain.   ramipril (ALTACE) 10 MG capsule, Take 1 capsule (10 mg total) by mouth daily.   rosuvastatin (CRESTOR) 40 MG tablet, Take 1 tablet (40 mg total) by mouth daily.  Current Outpatient Medications (Respiratory):    benzonatate (TESSALON) 100 MG capsule, Take 1 capsule (100 mg total) by mouth 3 (three) times daily as needed.   promethazine-dextromethorphan (PROMETHAZINE-DM) 6.25-15 MG/5ML syrup, Take 5 mLs by mouth 4 (four) times daily as needed for cough.  Current Outpatient Medications (Analgesics):    acetaminophen (TYLENOL) 500 MG tablet, Take 1,000 mg by mouth every 6 (six) hours as needed (Pain).   aspirin 81 MG tablet, Take 81 mg by mouth daily.    meloxicam (MOBIC) 15 MG tablet, Take 1 tablet (15 mg total) by mouth daily.   Current Outpatient Medications (Other):    dicyclomine (BENTYL) 20 MG tablet, Take 1 tablet (20 mg total) by mouth every 6 (six) hours as needed for spasms.   gabapentin (NEURONTIN) 300 MG capsule, Take 1 capsule (300 mg total) by mouth at bedtime.   Melatonin 10 MG TABS, Take by mouth.   pantoprazole (PROTONIX) 40 MG tablet, Take 1 tablet (40 mg total) by mouth daily.   Vitamin D, Ergocalciferol, (DRISDOL) 1.25 MG (50000 UNIT) CAPS capsule, Take 1 capsule (50,000 Units total) by mouth every 7 (seven) days.    Objective  Blood pressure 122/64, pulse (!) 48, height 5' (1.524 m), weight 159 lb (72.1 kg), SpO2 99 %.   General: [name] apparent distress alert and oriented x3 mood and affect normal, dressed appropriately.  HEENT: Pupils equal, extraocular movements intact  Respiratory: Patient's speak in full sentences and does not appear short of breath  Cardiovascular: [name] lower extremity edema, non tender, [name] erythema  Gait normal with good balance and coordination.  MSK: Left foot exam shows patient is nontender at all at this time.  Full range of motion of the ankle.  5 out of 5 strength    Impression and Recommendations:    The above documentation has been reviewed and is accurate and complete Lyndal Pulley, DO

## 2020-12-11 ENCOUNTER — Ambulatory Visit: Payer: Medicare HMO | Admitting: Family Medicine

## 2020-12-11 ENCOUNTER — Other Ambulatory Visit: Payer: Self-pay

## 2020-12-11 ENCOUNTER — Ambulatory Visit: Payer: Self-pay

## 2020-12-11 VITALS — BP 122/64 | HR 48 | Ht 60.0 in | Wt 159.0 lb

## 2020-12-11 DIAGNOSIS — M79672 Pain in left foot: Secondary | ICD-10-CM | POA: Diagnosis not present

## 2020-12-11 DIAGNOSIS — M84375D Stress fracture, left foot, subsequent encounter for fracture with routine healing: Secondary | ICD-10-CM | POA: Diagnosis not present

## 2020-12-11 MED ORDER — MELOXICAM 15 MG PO TABS: 15.0000 mg | ORAL_TABLET | Freq: Every day | ORAL | 0 refills | Status: DC

## 2020-12-11 NOTE — Assessment & Plan Note (Signed)
Foot exam has no pain whatsoever at this time.  Able to increase activity as tolerated.  Related.  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  Patient will follow up with me again as needed

## 2020-12-12 ENCOUNTER — Ambulatory Visit: Payer: Medicare HMO | Admitting: Family Medicine

## 2020-12-12 ENCOUNTER — Encounter: Payer: Self-pay | Admitting: Gastroenterology

## 2020-12-12 ENCOUNTER — Ambulatory Visit: Payer: Medicare HMO | Admitting: Gastroenterology

## 2020-12-12 VITALS — BP 108/78 | HR 58 | Ht 60.0 in | Wt 159.0 lb

## 2020-12-12 DIAGNOSIS — R1030 Lower abdominal pain, unspecified: Secondary | ICD-10-CM | POA: Diagnosis not present

## 2020-12-12 DIAGNOSIS — K5900 Constipation, unspecified: Secondary | ICD-10-CM | POA: Insufficient documentation

## 2020-12-12 NOTE — Progress Notes (Signed)
12/12/2020 Courtney [name] [id number] [birth year]   HISTORY OF PRESENT ILLNESS: This is a 70 year old female who is a patient from Dr. Celesta Aver.  She presents here today with complaints of constipation and lower abdominal pain.  She says that she initially had an episode in May and thought maybe it was a UTI.  She had 2 episodes in July and one in September.  She saw her GYN and had evaluation as she thought it was reminiscent of endometriosis pain that she had previously.  Then she realized that this seemed to be associated with constipation.  She said it would last about 4 days or so.  She would take Bentyl with some relief.  When she would finally have a good bowel movement the pain would resolve and would just linger to a very mild degree before completely resolving.  She says otherwise she does seem to move her bowels every day or so.  She denies any pain in between these episodes, no rectal bleeding, no weight loss.  Her last colonoscopy was in January 2015 by Dr. Amedeo Plenty at [facility] at which time she had one 6 mm polyp that was removed and internal hemorrhoids.  The polyp ended up being benign polypoid mucosa on pathology.   Past Medical History:  Diagnosis Date   Aortic valve sclerosis    echo 67/1245, soft systolic murmur   Arthritis    in right hip   Back pain    Basal cell carcinoma 05/31/2016   bcc left forehead  tx exc   Carotid artery disease (HCC)    40-59% bilateral, doppler December 2010   Cataract    Constipation    Coronary artery disease    a. BMS-mid RCA 11/2007 b. stress echo 02/2009 subtle inferior HK, lateral ST depressions with stress c. follow-up cath 02/2009: RCA stent patent, severe but stable stenosis of distal posterior lateral branch RCA. LV normal, med tx unless more sx c. ETT Myoview 08/27/12: negative for ischemia; EF 64%   Decreased hearing    Depression    Mild, August, 2012   Dyslipidemia    Easy bruising    Ejection fraction    EF normal,  catheterization, 2011 /  EF 55%, echo, 2010   Fluid overload    Mild, stable since hospitalization in the past   GERD (gastroesophageal reflux disease)    occarionally, takes Pepcid over the counter   Heart murmur    has, occasional PVC's   History of right hip replacement    Hyperlipidemia    Hypertension    Joint pain    Myocardial infarction ([place])    2009   Palpitations    PVC (premature ventricular contraction)    per prior Holter monitor   Right hip pain 12/2009   Shellfish allergy    Stress fracture    Left foot   Trouble in sleeping    Vitamin B12 deficiency    Vitamin D deficiency    Past Surgical History:  Procedure Laterality Date   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/01/2005   BREAST BIOPSY Left    carcinoid tumor removal     colon-   CARDIAC CATHETERIZATION  1/211   Patent RCA stent, stenotic PLB supplying small distribution; medically managed   CATARACT EXTRACTION, BILATERAL     CORONARY ANGIOPLASTY WITH STENT PLACEMENT  11/2007   BMS-mid RCA   DIAGNOSTIC LAPAROSCOPY     1982 for endometriosis   DILATION AND CURETTAGE OF UTERUS  2005  SKIN CANCER EXCISION     TOTAL HIP ARTHROPLASTY Right 07/08/2014   Procedure: RIGHT TOTAL HIP ARTHROPLASTY ANTERIOR APPROACH;  Surgeon: Mcarthur Rossetti, MD;  Location: [facility];  Service: Orthopedics;  Laterality: Right;   WISDOM TOOTH EXTRACTION      reports that she has never smoked. She has never used smokeless tobacco. She reports current alcohol use. She reports that she does not use drugs. family history includes Heart attack in her paternal grandfather; Heart attack (age of onset: 91) in her father; Heart disease in her mother and sister; Hyperlipidemia in her father and mother; Hypertension in her father and mother; Obesity in her mother; Stroke in her mother; Sudden death in her father. Allergies  Allergen Reactions   Erythromycin Hives   Penicillins Hives    Has patient had a PCN reaction causing immediate rash,  facial/tongue/throat swelling, SOB or lightheadedness with hypotension: No Has patient had a PCN reaction causing severe rash involving mucus membranes or skin necrosis: No Has patient had a PCN reaction that required hospitalization: No Has patient had a PCN reaction occurring within the last 10 years: No If all of the above answers are "NO", then may proceed with Cephalosporin use.    Shellfish Allergy Hives      Outpatient Encounter Medications as of 12/12/2020  Medication Sig   acetaminophen (TYLENOL) 500 MG tablet Take 1,000 mg by mouth every 6 (six) hours as needed (Pain).   aspirin 81 MG tablet Take 81 mg by mouth daily.   dicyclomine (BENTYL) 20 MG tablet Take 1 tablet (20 mg total) by mouth every 6 (six) hours as needed for spasms.   ezetimibe (ZETIA) 10 MG tablet Take 1 tablet (10 mg total) by mouth daily.   gabapentin (NEURONTIN) 300 MG capsule Take 1 capsule (300 mg total) by mouth at bedtime.   Melatonin 10 MG TABS Take by mouth.   meloxicam (MOBIC) 15 MG tablet Take 1 tablet (15 mg total) by mouth daily.   metoprolol tartrate (LOPRESSOR) 25 MG tablet Take 0.5 tablets (12.5 mg total) by mouth 2 (two) times daily.   nitroGLYCERIN (NITROSTAT) 0.4 MG SL tablet Place 1 tablet (0.4 mg total) under the tongue every 5 (five) minutes as needed for chest pain.   pantoprazole (PROTONIX) 40 MG tablet Take 1 tablet (40 mg total) by mouth daily.   ramipril (ALTACE) 10 MG capsule Take 1 capsule (10 mg total) by mouth daily.   rosuvastatin (CRESTOR) 40 MG tablet Take 1 tablet (40 mg total) by mouth daily.   Vitamin D, Ergocalciferol, (DRISDOL) 1.25 MG (50000 UNIT) CAPS capsule Take 1 capsule (50,000 Units total) by mouth every 7 (seven) days.   [DISCONTINUED] benzonatate (TESSALON) 100 MG capsule Take 1 capsule (100 mg total) by mouth 3 (three) times daily as needed.   [DISCONTINUED] predniSONE (DELTASONE) 20 MG tablet Take 2 tablets (40 mg total) by mouth daily with breakfast.    [DISCONTINUED] promethazine-dextromethorphan (PROMETHAZINE-DM) 6.25-15 MG/5ML syrup Take 5 mLs by mouth 4 (four) times daily as needed for cough.   No facility-administered encounter medications on file as of 12/12/2020.    REVIEW OF SYSTEMS  : All other systems reviewed and negative except where noted in the History of Present Illness.   PHYSICAL EXAM: BP 108/78   Pulse (!) 58   Ht 5' (1.524 m)   Wt 159 lb (72.1 kg)   BMI 31.05 kg/m  General: Well developed white female in no acute distress Head: Normocephalic and atraumatic Eyes:  Sclerae anicteric, conjunctiva pink. Ears: Normal auditory acuity Lungs: Clear throughout to auscultation; no W/R/R. Heart: Regular rate and rhythm; 2-3/6 SEM noted. Abdomen: Soft, non-distended.  BS present.  Non-tender. Musculoskeletal: Symmetrical with no gross deformities  Skin: No lesions on visible extremities Extremities: No edema  Neurological: Alert oriented x 4, grossly non-focal Psychological:  Alert and cooperative. Normal mood and affect  ASSESSMENT AND PLAN: *Episodes of lower abdominal pain that seem to be associated with constipation:  In between these episodes she feels fine.  No rectal bleeding, weight loss.  We will plan to treat her constipation by having her take MiraLAX 1 capful mixed in 8 ounces of liquid daily.  I would like her to do this for the next 4 to 5 weeks and call us or message [place] with an update.  If symptoms continue to occur despite using the MiraLAX we could consider CT scan versus repeat colonoscopy, etc.  CC:  Marrian Salvage,*

## 2020-12-12 NOTE — Patient Instructions (Signed)
Start Miralax 1 capful daily in 8 ounces of liquid.  Call back or send mychart message in 4-5 weeks with an update, ask for Koren Shiver, RN.   If you are age 72 or older, your body mass index should be between 23-30. Your Body mass index is 31.05 kg/m. If this is out of the aforementioned range listed, please consider follow up with your Primary Care Provider.  If you are age 8 or younger, your body mass index should be between 19-25. Your Body mass index is 31.05 kg/m. If this is out of the aformentioned range listed, please consider follow up with your Primary Care Provider.   ________________________________________________________  The  GI providers would like to encourage you to use [facility] to communicate with providers for non-urgent requests or questions.  Due to long hold times on the telephone, sending your provider a message by [facility] may be a faster and more efficient way to get a response.  Please allow 48 business hours for a response.  Please remember that this is for non-urgent requests.  _______________________________________________________

## 2021-01-02 ENCOUNTER — Other Ambulatory Visit: Payer: Self-pay

## 2021-01-02 ENCOUNTER — Ambulatory Visit (INDEPENDENT_AMBULATORY_CARE_PROVIDER_SITE_OTHER): Payer: Medicare HMO | Admitting: Family Medicine

## 2021-01-02 ENCOUNTER — Encounter (INDEPENDENT_AMBULATORY_CARE_PROVIDER_SITE_OTHER): Payer: Self-pay | Admitting: Family Medicine

## 2021-01-02 VITALS — BP 131/68 | HR 54 | Ht 60.0 in | Wt 152.0 lb

## 2021-01-02 DIAGNOSIS — Z6830 Body mass index (BMI) 30.0-30.9, adult: Secondary | ICD-10-CM

## 2021-01-02 DIAGNOSIS — E669 Obesity, unspecified: Secondary | ICD-10-CM

## 2021-01-02 DIAGNOSIS — I10 Essential (primary) hypertension: Secondary | ICD-10-CM

## 2021-01-02 NOTE — Telephone Encounter (Signed)
Please see message . Thank you .

## 2021-01-02 NOTE — Progress Notes (Addendum)
Chief Complaint:   OBESITY Courtney [name] is here to discuss her progress with her obesity treatment plan along with follow-up of her obesity related diagnoses. Courtney [name] is on the Category 1 Plan and states she is following her eating plan approximately 75% of the time. Courtney [name] states she has increased her activity daily.   Today's visit was #: 31 Starting weight: 157 lbs Starting date: 06/16/2017 Today's weight: 152 lbs Today's date: 01/02/2021 Total lbs lost to date: 5 Total lbs lost since last in-office visit: 0  Interim History: Courtney [name] has done well maintaining her weight in the last month. She has had to eat out more while having home renovations.  Subjective:   1. Essential hypertension Courtney [name]'s blood pressure is stable on her medications. She is working on diet and exercise. She has no signs of hypotension.  Assessment/Plan:   1. Essential hypertension Courtney [name] will continue her diet and exercise, and will watch for signs of hypotension as she continues her lifestyle modifications.  2. Obesity BMI today is 70 Courtney [name] is currently in the action stage of change. As such, her goal is to continue with weight loss efforts. She has agreed to the Category 1 Plan.   Exercise goals: As is.  Behavioral modification strategies: holiday eating strategies .  Courtney [name] has agreed to follow-up with our clinic in 4 weeks. She was informed of the importance of frequent follow-up visits to maximize her success with intensive lifestyle modifications for her multiple health conditions.   Objective:   Blood pressure 131/68, pulse (!) 54, height 5' (1.524 m), weight 152 lb (68.9 kg), SpO2 99 %. Body mass index is 29.69 kg/m.  General: Cooperative, alert, well developed, in no acute distress. HEENT: Conjunctivae and lids unremarkable. Cardiovascular: Regular rhythm.  Lungs: Normal work of breathing. Neurologic: No focal deficits.   Lab Results  Component Value Date   CREATININE 1.09 10/17/2020   BUN  27 (H) 10/17/2020   NA 138 10/17/2020   K 4.0 10/17/2020   CL 105 10/17/2020   CO2 27 10/17/2020   Lab Results  Component Value Date   ALT 18 10/17/2020   AST 31 10/17/2020   ALKPHOS 46 10/17/2020   BILITOT 0.4 10/17/2020   Lab Results  Component Value Date   HGBA1C 5.8 (H) 08/30/2020   HGBA1C 5.8 (H) 03/30/2020   HGBA1C 5.6 12/01/2019   HGBA1C 5.7 (H) 08/04/2019   HGBA1C 5.4 09/14/2018   Lab Results  Component Value Date   INSULIN 6.8 08/30/2020   INSULIN 9.4 03/30/2020   INSULIN 7.2 12/01/2019   INSULIN 4.9 08/04/2019   INSULIN 4.2 09/14/2018   Lab Results  Component Value Date   TSH 1.75 01/03/2020   Lab Results  Component Value Date   CHOL 156 10/25/2020   HDL 67 10/25/2020   LDLCALC 76 10/25/2020   TRIG 66 10/25/2020   CHOLHDL 2.3 10/25/2020   Lab Results  Component Value Date   VD25OH 64.5 08/30/2020   VD25OH 62.7 03/30/2020   VD25OH 68.5 12/01/2019   Lab Results  Component Value Date   WBC 7.3 01/03/2020   HGB 13.0 01/03/2020   HCT 37.7 01/03/2020   MCV 91.5 01/03/2020   PLT 196.0 01/03/2020   Lab Results  Component Value Date   IRON 117 10/17/2020   TIBC 431.2 10/17/2020   FERRITIN 133.2 10/17/2020   Attestation Statements:   Reviewed by clinician on day of visit: allergies, medications, problem list, medical history, surgical history, family history, social history, and  previous encounter notes.  Time spent on visit including pre-visit chart review and post-visit care and charting was 32 minutes.    I, Trixie Dredge, am acting as transcriptionist for Dennard Nip, MD.  I have reviewed the above documentation for accuracy and completeness, and I agree with the above. -  Dennard Nip, MD

## 2021-01-03 NOTE — Addendum Note (Signed)
Addended by: Dennard Nip D on: 01/03/2021 06:52 AM   Modules accepted: Level of Service

## 2021-02-01 ENCOUNTER — Other Ambulatory Visit: Payer: Self-pay

## 2021-02-01 ENCOUNTER — Encounter (INDEPENDENT_AMBULATORY_CARE_PROVIDER_SITE_OTHER): Payer: Self-pay | Admitting: Family Medicine

## 2021-02-01 ENCOUNTER — Ambulatory Visit (INDEPENDENT_AMBULATORY_CARE_PROVIDER_SITE_OTHER): Payer: Medicare HMO | Admitting: Family Medicine

## 2021-02-01 VITALS — BP 148/64 | HR 54 | Temp 98.1°F | Ht 60.0 in | Wt 155.0 lb

## 2021-02-01 DIAGNOSIS — F439 Reaction to severe stress, unspecified: Secondary | ICD-10-CM | POA: Diagnosis not present

## 2021-02-01 DIAGNOSIS — E669 Obesity, unspecified: Secondary | ICD-10-CM | POA: Diagnosis not present

## 2021-02-01 DIAGNOSIS — I10 Essential (primary) hypertension: Secondary | ICD-10-CM | POA: Diagnosis not present

## 2021-02-01 DIAGNOSIS — Z6830 Body mass index (BMI) 30.0-30.9, adult: Secondary | ICD-10-CM

## 2021-02-01 DIAGNOSIS — E559 Vitamin D deficiency, unspecified: Secondary | ICD-10-CM

## 2021-02-01 MED ORDER — VITAMIN D (ERGOCALCIFEROL) 1.25 MG (50000 UNIT) PO CAPS: 50000.0000 [IU] | ORAL_CAPSULE | ORAL | 0 refills | Status: DC

## 2021-02-01 NOTE — Progress Notes (Signed)
Chief Complaint:   OBESITY Courtney [name] is here to discuss her progress with her obesity treatment plan along with follow-up of her obesity related diagnoses. Courtney [name] is on the Category 1 Plan and states she is following her eating plan approximately 80% of the time. Courtney [name] states she is doing 0 minutes 0 times per week.  Today's visit was #: 40 Starting weight: 157 lbs Starting date: 06/16/2017 Today's weight: 155 lbs Today's date: 02/01/2021 Total lbs lost to date: 2 Total lbs lost since last in-office visit: 0  Interim History: Courtney [name] has done some celebration eating over Thanksgiving, and her weight is up a little bit. She is dealing with a lot of significant family stress and she hasn't been able to meal plan as much.  Subjective:   1. Essential hypertension Courtney [name]'s blood pressure is elevated this morning, which is very unusual for her as she is normally very well controlled.  2. Vitamin D deficiency Courtney [name] is stable on Vit D, and her last level was at goal.  3. Courtney [name] is dealing with a lot of stress with her daughter and her addiction issues. She has done therapy in the past to help her deal with this.  Assessment/Plan:   1. Essential hypertension Courtney [name] will continue with diet and her medications, and we will recheck her blood pressure at her next visit to see if this is an isolated elevation.  2. Vitamin D deficiency We will refill prescription Vitamin D for 90 days with no refills. Courtney [name] will follow-up for routine testing of Vitamin D, at least 2-3 times per year to avoid over-replacement.  - Vitamin D, Ergocalciferol, (DRISDOL) 1.25 MG (50000 UNIT) CAPS capsule; Take 1 capsule (50,000 Units total) by mouth every 7 (seven) days.  Dispense: 12 capsule; Refill: 0  3. Stress Courtney [name] was offered support and encouragement, and she will continue to follow up as directed.  4. Obesity with current BMI of 30.3 Courtney [name] is currently in the action stage of change. As such, her goal  is to continue with weight loss efforts. She has agreed to the Category 1 Plan.   We will recheck her fasting labs at her next visit.  Behavioral modification strategies: emotional eating strategies and holiday eating strategies .  Courtney [name] has agreed to follow-up with our clinic in 4 weeks. She was informed of the importance of frequent follow-up visits to maximize her success with intensive lifestyle modifications for her multiple health conditions.   Objective:   Blood pressure (!) 148/64, pulse (!) 54, temperature 98.1 F (36.7 C), height 5' (1.524 m), weight 155 lb (70.3 kg), SpO2 97 %. Body mass index is 30.27 kg/m.  General: Cooperative, alert, well developed, in no acute distress. HEENT: Conjunctivae and lids unremarkable. Cardiovascular: Regular rhythm.  Lungs: Normal work of breathing. Neurologic: No focal deficits.   Lab Results  Component Value Date   CREATININE 1.09 10/17/2020   BUN 27 (H) 10/17/2020   NA 138 10/17/2020   K 4.0 10/17/2020   CL 105 10/17/2020   CO2 27 10/17/2020   Lab Results  Component Value Date   ALT 18 10/17/2020   AST 31 10/17/2020   ALKPHOS 46 10/17/2020   BILITOT 0.4 10/17/2020   Lab Results  Component Value Date   HGBA1C 5.8 (H) 08/30/2020   HGBA1C 5.8 (H) 03/30/2020   HGBA1C 5.6 12/01/2019   HGBA1C 5.7 (H) 08/04/2019   HGBA1C 5.4 09/14/2018   Lab Results  Component Value Date   INSULIN 6.8 08/30/2020  INSULIN 9.4 03/30/2020   INSULIN 7.2 12/01/2019   INSULIN 4.9 08/04/2019   INSULIN 4.2 09/14/2018   Lab Results  Component Value Date   TSH 1.75 01/03/2020   Lab Results  Component Value Date   CHOL 156 10/25/2020   HDL 67 10/25/2020   LDLCALC 76 10/25/2020   TRIG 66 10/25/2020   CHOLHDL 2.3 10/25/2020   Lab Results  Component Value Date   VD25OH 64.5 08/30/2020   VD25OH 62.7 03/30/2020   VD25OH 68.5 12/01/2019   Lab Results  Component Value Date   WBC 7.3 01/03/2020   HGB 13.0 01/03/2020   HCT 37.7 01/03/2020    MCV 91.5 01/03/2020   PLT 196.0 01/03/2020   Lab Results  Component Value Date   IRON 117 10/17/2020   TIBC 431.2 10/17/2020   FERRITIN 133.2 10/17/2020    Obesity Behavioral Intervention:   Approximately 15 minutes were spent on the discussion below.  ASK: We discussed the diagnosis of obesity with Courtney [name] today and Courtney [name] agreed to give [place] permission to discuss obesity behavioral modification therapy today.  ASSESS: Courtney [name] has the diagnosis of obesity and her BMI today is 30.3. Courtney [name] is in the action stage of change.   ADVISE: Courtney [name] was educated on the multiple health risks of obesity as well as the benefit of weight loss to improve her health. She was advised of the need for long term treatment and the importance of lifestyle modifications to improve her current health and to decrease her risk of future health problems.  AGREE: Multiple dietary modification options and treatment options were discussed and Courtney [name] agreed to follow the recommendations documented in the above note.  ARRANGE: Courtney [name] was educated on the importance of frequent visits to treat obesity as outlined per CMS and USPSTF guidelines and agreed to schedule her next follow up appointment today.  Attestation Statements:   Reviewed by clinician on day of visit: allergies, medications, problem list, medical history, surgical history, family history, social history, and previous encounter notes.   I, Courtney [name], am acting as transcriptionist for Courtney Nip, MD.  I have reviewed the above documentation for accuracy and completeness, and I agree with the above. -  Courtney Nip, MD

## 2021-02-05 ENCOUNTER — Encounter: Payer: Self-pay | Admitting: Cardiology

## 2021-02-06 DIAGNOSIS — H5203 Hypermetropia, bilateral: Secondary | ICD-10-CM | POA: Diagnosis not present

## 2021-03-04 DIAGNOSIS — Z20822 Contact with and (suspected) exposure to covid-19: Secondary | ICD-10-CM | POA: Diagnosis not present

## 2021-03-13 ENCOUNTER — Ambulatory Visit (INDEPENDENT_AMBULATORY_CARE_PROVIDER_SITE_OTHER): Payer: Medicare HMO | Admitting: Family Medicine

## 2021-03-13 ENCOUNTER — Encounter (INDEPENDENT_AMBULATORY_CARE_PROVIDER_SITE_OTHER): Payer: Self-pay | Admitting: Family Medicine

## 2021-03-13 ENCOUNTER — Other Ambulatory Visit: Payer: Self-pay

## 2021-03-13 VITALS — BP 121/63 | HR 48 | Temp 97.7°F | Ht 60.0 in | Wt 157.0 lb

## 2021-03-13 DIAGNOSIS — Z6830 Body mass index (BMI) 30.0-30.9, adult: Secondary | ICD-10-CM | POA: Diagnosis not present

## 2021-03-13 DIAGNOSIS — E669 Obesity, unspecified: Secondary | ICD-10-CM

## 2021-03-13 DIAGNOSIS — R7303 Prediabetes: Secondary | ICD-10-CM

## 2021-03-13 DIAGNOSIS — E782 Mixed hyperlipidemia: Secondary | ICD-10-CM

## 2021-03-13 DIAGNOSIS — E559 Vitamin D deficiency, unspecified: Secondary | ICD-10-CM

## 2021-03-13 NOTE — Progress Notes (Signed)
Chief Complaint:   OBESITY Courtney [name] is here to discuss her progress with her obesity treatment plan along with follow-up of her obesity related diagnoses. Courtney [name] is on the Category 1 Plan and states she is following her eating plan approximately 85% of the time. Courtney [name] states she is doing 0 minutes 0 times per week.  Today's visit was #: 33 Starting weight: 157 lbs Starting date: 06/16/2017 Today's weight: 157 lbs Today's date: 03/13/2021 Total lbs lost to date: 0 Total lbs lost since last in-office visit: 0  Interim History: Courtney [name] has finished moving and unpacking. She is ready to concentrate on her nutrition and weight loss.  Subjective:   1. Pre-diabetes Courtney [name] is doing well with diet and exercise, and she is due for labs.  2. Vitamin D deficiency Courtney [name] is on Vit D, with no side effects noted. She is due for labs.  3. Mixed hyperlipidemia Courtney [name] is on Zetia and statin, and eating a low cholesterol diet. She is due for labs.   Assessment/Plan:   1. Pre-diabetes Courtney [name] will continue to work on weight loss, exercise, and decreasing simple carbohydrates to help decrease the risk of diabetes. We will check labs today.   - CMP14+EGFR - Insulin, random - Hemoglobin A1c  2. Vitamin D deficiency Low Vitamin D level contributes to fatigue and are associated with obesity, breast, and colon cancer. We will refill for 1 month. Courtney [name] will follow-up for routine testing of Vitamin D, at least 2-3 times per year to avoid over-replacement.  - VITAMIN D 25 Hydroxy (Vit-D Deficiency, Fractures)  3. Mixed hyperlipidemia Cardiovascular risk and specific lipid/LDL goals reviewed. We discussed several lifestyle modifications today. We will check labs today. Courtney [name] will continue to work on diet, exercise and weight loss efforts. Orders and follow up as documented in patient record.   - Lipid Panel With LDL/HDL Ratio  4. Obesity with current BMI of 30.8 Courtney [name] is currently in the action stage of  change. As such, her goal is to continue with weight loss efforts. She has agreed to the Category 1 Plan.   Behavioral modification strategies: increasing lean protein intake and no skipping meals.  Courtney [name] has agreed to follow-up with our clinic in 4 weeks. She was informed of the importance of frequent follow-up visits to maximize her success with intensive lifestyle modifications for her multiple health conditions.   Objective:   Blood pressure 121/63, pulse (!) 48, temperature 97.7 F (36.5 C), height 5' (1.524 m), weight 157 lb (71.2 kg), SpO2 98 %. Body mass index is 30.66 kg/m.  General: Cooperative, alert, well developed, in no acute distress. HEENT: Conjunctivae and lids unremarkable. Cardiovascular: Regular rhythm.  Lungs: Normal work of breathing. Neurologic: No focal deficits.   Lab Results  Component Value Date   CREATININE 1.09 10/17/2020   BUN 27 (H) 10/17/2020   NA 138 10/17/2020   K 4.0 10/17/2020   CL 105 10/17/2020   CO2 27 10/17/2020   Lab Results  Component Value Date   ALT 18 10/17/2020   AST 31 10/17/2020   ALKPHOS 46 10/17/2020   BILITOT 0.4 10/17/2020   Lab Results  Component Value Date   HGBA1C 5.8 (H) 08/30/2020   HGBA1C 5.8 (H) 03/30/2020   HGBA1C 5.6 12/01/2019   HGBA1C 5.7 (H) 08/04/2019   HGBA1C 5.4 09/14/2018   Lab Results  Component Value Date   INSULIN 6.8 08/30/2020   INSULIN 9.4 03/30/2020   INSULIN 7.2 12/01/2019   INSULIN 4.9 08/04/2019  INSULIN 4.2 09/14/2018   Lab Results  Component Value Date   TSH 1.75 01/03/2020   Lab Results  Component Value Date   CHOL 156 10/25/2020   HDL 67 10/25/2020   LDLCALC 76 10/25/2020   TRIG 66 10/25/2020   CHOLHDL 2.3 10/25/2020   Lab Results  Component Value Date   VD25OH 64.5 08/30/2020   VD25OH 62.7 03/30/2020   VD25OH 68.5 12/01/2019   Lab Results  Component Value Date   WBC 7.3 01/03/2020   HGB 13.0 01/03/2020   HCT 37.7 01/03/2020   MCV 91.5 01/03/2020   PLT 196.0  01/03/2020   Lab Results  Component Value Date   IRON 117 10/17/2020   TIBC 431.2 10/17/2020   FERRITIN 133.2 10/17/2020    Obesity Behavioral Intervention:   Approximately 15 minutes were spent on the discussion below.  ASK: We discussed the diagnosis of obesity with Courtney [name] today and Courtney [name] agreed to give [place] permission to discuss obesity behavioral modification therapy today.  ASSESS: Courtney [name] has the diagnosis of obesity and her BMI today is 30.8. Courtney [name] is in the action stage of change.   ADVISE: Courtney [name] was educated on the multiple health risks of obesity as well as the benefit of weight loss to improve her health. She was advised of the need for long term treatment and the importance of lifestyle modifications to improve her current health and to decrease her risk of future health problems.  AGREE: Multiple dietary modification options and treatment options were discussed and Courtney [name] agreed to follow the recommendations documented in the above note.  ARRANGE: Courtney [name] was educated on the importance of frequent visits to treat obesity as outlined per CMS and USPSTF guidelines and agreed to schedule her next follow up appointment today.  Attestation Statements:   Reviewed by clinician on day of visit: allergies, medications, problem list, medical history, surgical history, family history, social history, and previous encounter notes.   I, Courtney [name], am acting as transcriptionist for Dennard Nip, MD.  I have reviewed the above documentation for accuracy and completeness, and I agree with the above. -  Dennard Nip, MD

## 2021-03-15 ENCOUNTER — Other Ambulatory Visit: Payer: Self-pay

## 2021-03-15 ENCOUNTER — Encounter: Payer: Self-pay | Admitting: Family Medicine

## 2021-03-15 DIAGNOSIS — M5416 Radiculopathy, lumbar region: Secondary | ICD-10-CM

## 2021-03-19 DIAGNOSIS — Z6830 Body mass index (BMI) 30.0-30.9, adult: Secondary | ICD-10-CM | POA: Diagnosis not present

## 2021-03-19 DIAGNOSIS — M8580 Other specified disorders of bone density and structure, unspecified site: Secondary | ICD-10-CM | POA: Diagnosis not present

## 2021-03-19 DIAGNOSIS — Z124 Encounter for screening for malignant neoplasm of cervix: Secondary | ICD-10-CM | POA: Diagnosis not present

## 2021-03-19 DIAGNOSIS — Z1211 Encounter for screening for malignant neoplasm of colon: Secondary | ICD-10-CM | POA: Diagnosis not present

## 2021-03-19 DIAGNOSIS — Z01419 Encounter for gynecological examination (general) (routine) without abnormal findings: Secondary | ICD-10-CM | POA: Diagnosis not present

## 2021-03-19 DIAGNOSIS — Z1239 Encounter for other screening for malignant neoplasm of breast: Secondary | ICD-10-CM | POA: Diagnosis not present

## 2021-03-20 DIAGNOSIS — R7303 Prediabetes: Secondary | ICD-10-CM | POA: Diagnosis not present

## 2021-03-20 DIAGNOSIS — E559 Vitamin D deficiency, unspecified: Secondary | ICD-10-CM | POA: Diagnosis not present

## 2021-03-20 DIAGNOSIS — E782 Mixed hyperlipidemia: Secondary | ICD-10-CM | POA: Diagnosis not present

## 2021-03-21 LAB — CMP14+EGFR
ALT: 19 IU/L (ref 0–32)
AST: 22 IU/L (ref 0–40)
Albumin/Globulin Ratio: 2.2 (ref 1.2–2.2)
Albumin: 4.7 g/dL (ref 3.7–4.7)
Alkaline Phosphatase: 76 IU/L (ref 44–121)
BUN/Creatinine Ratio: 29 — ABNORMAL HIGH (ref 12–28)
BUN: 26 mg/dL (ref 8–27)
Bilirubin Total: 0.2 mg/dL (ref 0.0–1.2)
CO2: 22 mmol/L (ref 20–29)
Calcium: 9.4 mg/dL (ref 8.7–10.3)
Chloride: 103 mmol/L (ref 96–106)
Creatinine, Ser: 0.89 mg/dL (ref 0.57–1.00)
Globulin, Total: 2.1 g/dL (ref 1.5–4.5)
Glucose: 98 mg/dL (ref 70–99)
Potassium: 4.9 mmol/L (ref 3.5–5.2)
Sodium: 141 mmol/L (ref 134–144)
Total Protein: 6.8 g/dL (ref 6.0–8.5)
eGFR: 69 mL/min/{1.73_m2} (ref >59)

## 2021-03-21 LAB — LIPID PANEL WITH LDL/HDL RATIO
Cholesterol, Total: 164 mg/dL (ref 100–199)
HDL: 68 mg/dL (ref >39)
LDL Chol Calc (NIH): 82 mg/dL (ref 0–99)
LDL/HDL Ratio: 1.2 ratio (ref 0.0–3.2)
Triglycerides: 71 mg/dL (ref 0–149)
VLDL Cholesterol Cal: 14 mg/dL (ref 5–40)

## 2021-03-21 LAB — HEMOGLOBIN A1C
Est. average glucose Bld gHb Est-mCnc: 126 mg/dL
Hgb A1c MFr Bld: 6 % — ABNORMAL HIGH (ref 4.8–5.6)

## 2021-03-21 LAB — INSULIN, RANDOM: INSULIN: 8.2 u[IU]/mL (ref 2.6–24.9)

## 2021-03-21 LAB — VITAMIN D 25 HYDROXY (VIT D DEFICIENCY, FRACTURES): Vit D, 25-Hydroxy: 79.4 ng/mL (ref 30.0–100.0)

## 2021-03-24 NOTE — Progress Notes (Signed)
Cardiology Office Note:    Date:  03/26/2021   ID:  Courtney [name], DOB Mar 16, 1950, MRN [id number]  PCP:  Marrian Salvage, FNP  Cardiologist:  Beautiful Pensyl Martinique, MD   Referring MD: Marrian Salvage,*   Chief Complaint  Patient presents with   Coronary Artery Disease    History of Present Illness:    Courtney [name] is a 71 y.o. female with a hx of CAD, carotid artery disease, HLD, HTN, and PVCs. She is s/p BMS to mid RCA in 2009. Abnormal nuclear stress test in 2011 led to a repeat cath that showed nonobstructive disease. Repeat myoview in Oct 2018 was normal. She was last seen 12/06/19 by Almyra Deforest PA-C at which time he changed her metoprolol tartrate from 25 mg daily to 12.5 mg BID (standard dosing). BP normalized.  On her last visit with Fabian Sharp PA-C a murmur was noted. Echo was obtained showing mild Aortic stenosis  On follow up today she is doing well. Denies any chest pain. Concerned about her BP which she states has been 140-150 at home but readings in office have been good. Did have significant fatigue after starting on Repatha but this was just after Covid so she is going to try it again. She is exercising regularly at [facility].   Past Medical History:  Diagnosis Date   Aortic valve sclerosis    echo 63/8466, soft systolic murmur   Arthritis    in right hip   Back pain    Basal cell carcinoma 05/31/2016   bcc left forehead  tx exc   Carotid artery disease (HCC)    40-59% bilateral, doppler December 2010   Cataract    Constipation    Coronary artery disease    a. BMS-mid RCA 11/2007 b. stress echo 02/2009 subtle inferior HK, lateral ST depressions with stress c. follow-up cath 02/2009: RCA stent patent, severe but stable stenosis of distal posterior lateral branch RCA. LV normal, med tx unless more sx c. ETT Myoview 08/27/12: negative for ischemia; EF 64%   Decreased hearing    Depression    Mild, August, 2012   Dyslipidemia    Easy bruising    Ejection  fraction    EF normal, catheterization, 2011 /  EF 55%, echo, 2010   Fluid overload    Mild, stable since hospitalization in the past   GERD (gastroesophageal reflux disease)    occarionally, takes Pepcid over the counter   Heart murmur    has, occasional PVC's   History of right hip replacement    Hyperlipidemia    Hypertension    Joint pain    Myocardial infarction ([place])    2009   Palpitations    PVC (premature ventricular contraction)    per prior Holter monitor   Right hip pain 12/2009   Shellfish allergy    Stress fracture    Left foot   Trouble in sleeping    Vitamin B12 deficiency    Vitamin D deficiency     Past Surgical History:  Procedure Laterality Date   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/01/2005   BREAST BIOPSY Left    carcinoid tumor removal     colon-   CARDIAC CATHETERIZATION  1/211   Patent RCA stent, stenotic PLB supplying small distribution; medically managed   CATARACT EXTRACTION, BILATERAL     CORONARY ANGIOPLASTY WITH STENT PLACEMENT  11/2007   BMS-mid RCA   DIAGNOSTIC LAPAROSCOPY     1982 for endometriosis   DILATION  AND CURETTAGE OF UTERUS  2005   SKIN CANCER EXCISION     TOTAL HIP ARTHROPLASTY Right 07/08/2014   Procedure: RIGHT TOTAL HIP ARTHROPLASTY ANTERIOR APPROACH;  Surgeon: Kathryne Hitch, MD;  Location: [facility];  Service: Orthopedics;  Laterality: Right;   WISDOM TOOTH EXTRACTION      Current Medications: Current Meds  Medication Sig   acetaminophen (TYLENOL) 500 MG tablet Take 1,000 mg by mouth every 6 (six) hours as needed (Pain).   aspirin 81 MG tablet Take 81 mg by mouth daily.   dicyclomine (BENTYL) 20 MG tablet Take 1 tablet (20 mg total) by mouth every 6 (six) hours as needed for spasms.   ezetimibe (ZETIA) 10 MG tablet Take 1 tablet (10 mg total) by mouth daily.   gabapentin (NEURONTIN) 300 MG capsule Take 1 capsule (300 mg total) by mouth at bedtime.   Melatonin 10 MG TABS Take by mouth.   meloxicam (MOBIC) 15 MG tablet Take 1  tablet (15 mg total) by mouth daily.   metoprolol tartrate (LOPRESSOR) 25 MG tablet Take 0.5 tablets (12.5 mg total) by mouth 2 (two) times daily.   nitroGLYCERIN (NITROSTAT) 0.4 MG SL tablet Place 1 tablet (0.4 mg total) under the tongue every 5 (five) minutes as needed for chest pain.   pantoprazole (PROTONIX) 40 MG tablet Take 1 tablet (40 mg total) by mouth daily.   ramipril (ALTACE) 10 MG capsule Take 1 capsule (10 mg total) by mouth daily.   rosuvastatin (CRESTOR) 40 MG tablet Take 1 tablet (40 mg total) by mouth daily.   Vitamin D, Ergocalciferol, (DRISDOL) 1.25 MG (50000 UNIT) CAPS capsule Take 1 capsule (50,000 Units total) by mouth every 7 (seven) days.     Allergies:   Erythromycin, Penicillins, and Shellfish allergy   Social History   Socioeconomic History   Marital status: Divorced    Spouse name: Not on file   Number of children: 1   Years of education: 18   Highest education level: Not on file  Occupational History   Occupation: Retired - Publishing copy  Tobacco Use   Smoking status: Never   Smokeless tobacco: Never  Vaping Use   Vaping Use: Never used  Substance and Sexual Activity   Alcohol use: Yes    Comment: 1 glass of wine 3x/week   Drug use: No   Sexual activity: Yes  Other Topics Concern   Not on file  Social History Narrative   Right handed   Soda- sometimes   Drinks herbal tea   Fun: Dance, garden,    Denies religious beliefs effecting health care.   Denies abuse and feels safe at home   Social Determinants of Health   Financial Resource Strain: Not on file  Food Insecurity: Not on file  Transportation Needs: Not on file  Physical Activity: Not on file  Stress: Not on file  Social Connections: Not on file     Family History: The patient's family history includes Heart attack in her paternal grandfather; Heart attack (age of onset: 60) in her father; Heart disease in her mother and sister; Hyperlipidemia in her father and mother;  Hypertension in her father and mother; Obesity in her mother; Stroke in her mother; Sudden death in her father. There is no history of BRCA 1/2, Breast cancer, Esophageal cancer, Colon cancer, Pancreatic cancer, Liver disease, or Stomach cancer.  ROS:   Please see the history of present illness.    All other systems reviewed and are negative.  EKGs/Labs/Other Studies Reviewed:    The following studies were reviewed today:  Carotid ultrasound 2020 Summary:  Right Carotid: Velocities in the right ICA are consistent with a 1-39%  stenosis.   Left Carotid: Velocities in the left ICA are consistent with a 1-39%  stenosis.   Vertebrals:  Bilateral vertebral arteries demonstrate antegrade flow.  Subclavians: Normal flow hemodynamics were seen in bilateral subclavian arterie   Echo 10/03/20: IMPRESSIONS     1. Left ventricular ejection fraction, by estimation, is 65 to 70%. The  left ventricle has normal function. The left ventricle has no regional  wall motion abnormalities. Left ventricular diastolic parameters are  consistent with Grade I diastolic  dysfunction (impaired relaxation). The average left ventricular global  longitudinal strain is -22.0 %. The global longitudinal strain is normal.   2. Right ventricular systolic function is normal. The right ventricular  size is normal. There is normal pulmonary artery systolic pressure.   3. The mitral valve is normal in structure. Trivial mitral valve  regurgitation. No evidence of mitral stenosis.   4. The aortic valve is tricuspid. There is mild calcification of the  aortic valve. There is mild thickening of the aortic valve. Aortic valve  regurgitation is trivial. Mild aortic valve stenosis.   5. The inferior vena cava is normal in size with greater than 50%  respiratory variability, suggesting right atrial pressure of 3 mmHg.           EKG:  EKG is not ordered today.    Recent Labs: 03/20/2021: ALT 19; BUN 26; Creatinine, Ser  0.89; Potassium 4.9; Sodium 141  Recent Lipid Panel    Component Value Date/Time   CHOL 164 03/20/2021 1022   TRIG 71 03/20/2021 1022   HDL 68 03/20/2021 1022   CHOLHDL 2.3 10/25/2020 0829   CHOLHDL 3 11/30/2015 1028   VLDL 25.4 11/30/2015 1028   LDLCALC 82 03/20/2021 1022    Physical Exam:    VS:  BP 130/74    Pulse (!) 49    Ht 5' (1.524 m)    Wt 162 lb 3.2 oz (73.6 kg)    SpO2 100%    BMI 31.68 kg/m     Wt Readings from Last 3 Encounters:  03/26/21 162 lb 3.2 oz (73.6 kg)  03/13/21 157 lb (71.2 kg)  02/01/21 155 lb (70.3 kg)     GEN: Well nourished, well developed in no acute distress HEENT: Normal NECK: No JVD; No carotid bruits LYMPHATICS: No lymphadenopathy CARDIAC: regular rhythm, bradycardic rate, + systolic  murmur RESPIRATORY:  Clear to auscultation without rales, wheezing or rhonchi  ABDOMEN: Soft, non-tender, non-distended MUSCULOSKELETAL:  No edema; No deformity  SKIN: Warm and dry NEUROLOGIC:  Alert and oriented x 3 PSYCHIATRIC:  Normal affect   ASSESSMENT:    1. Coronary artery disease involving native coronary artery of native heart without angina pectoris   2. Essential hypertension   3. Hyperlipidemia LDL goal <70   4. Nonrheumatic aortic valve stenosis     PLAN:    In order of problems listed above:  CAD s/p BMS-RCA 2009 - continue ASA, crestor - nonischemic nuclear stress test 07/2016 - no angina   2. Hypertension - continue BB, ACEI - BP is well-controlled   3. Hyperlipidemia with LDL goal < 70 On Zetia and Crestor Still not at goal Will give Repatha another try   4. Mild aortic stenosis.  - asymptomatic   Follow up in one year.    Medication Adjustments/Labs  and Tests Ordered: Current medicines are reviewed at length with the patient today.  Concerns regarding medicines are outlined above.  No orders of the defined types were placed in this encounter.   No orders of the defined types were placed in this  encounter.    Signed, Emilina Smarr Martinique, MD  03/26/2021 8:32 AM    [facility]

## 2021-03-26 ENCOUNTER — Ambulatory Visit: Payer: Medicare HMO | Admitting: Cardiology

## 2021-03-26 ENCOUNTER — Encounter: Payer: Self-pay | Admitting: Cardiology

## 2021-03-26 ENCOUNTER — Other Ambulatory Visit: Payer: Self-pay

## 2021-03-26 VITALS — BP 130/74 | HR 49 | Ht 60.0 in | Wt 162.2 lb

## 2021-03-26 DIAGNOSIS — I10 Essential (primary) hypertension: Secondary | ICD-10-CM

## 2021-03-26 DIAGNOSIS — E785 Hyperlipidemia, unspecified: Secondary | ICD-10-CM

## 2021-03-26 DIAGNOSIS — I2510 Atherosclerotic heart disease of native coronary artery without angina pectoris: Secondary | ICD-10-CM | POA: Diagnosis not present

## 2021-03-26 DIAGNOSIS — I350 Nonrheumatic aortic (valve) stenosis: Secondary | ICD-10-CM

## 2021-03-26 MED ORDER — METOPROLOL TARTRATE 25 MG PO TABS: 12.5000 mg | ORAL_TABLET | Freq: Two times a day (BID) | ORAL | 3 refills | Status: DC

## 2021-03-26 MED ORDER — ROSUVASTATIN CALCIUM 40 MG PO TABS: 40.0000 mg | ORAL_TABLET | Freq: Every day | ORAL | 3 refills | Status: DC

## 2021-03-26 MED ORDER — EZETIMIBE 10 MG PO TABS: 10.0000 mg | ORAL_TABLET | Freq: Every day | ORAL | 3 refills | Status: DC

## 2021-03-28 ENCOUNTER — Ambulatory Visit
Admission: RE | Admit: 2021-03-28 | Discharge: 2021-03-28 | Disposition: A | Discharge status: Home / Self Care | Payer: Medicare HMO | Source: Ambulatory Visit | Attending: Family Medicine | Admitting: Family Medicine

## 2021-03-28 ENCOUNTER — Other Ambulatory Visit: Payer: Self-pay

## 2021-03-28 DIAGNOSIS — M5416 Radiculopathy, lumbar region: Secondary | ICD-10-CM

## 2021-03-28 DIAGNOSIS — M5116 Intervertebral disc disorders with radiculopathy, lumbar region: Secondary | ICD-10-CM | POA: Diagnosis not present

## 2021-03-28 MED ORDER — IOPAMIDOL (ISOVUE-M 200) INJECTION 41%
1.0000 mL | Freq: Once | INTRAMUSCULAR | Status: AC
  Administered 2021-03-28: 1 mL via EPIDURAL

## 2021-03-28 MED ORDER — METHYLPREDNISOLONE ACETATE 40 MG/ML INJ SUSP (RADIOLOG
80.0000 mg | Freq: Once | INTRAMUSCULAR | Status: AC
  Administered 2021-03-28: 80 mg via EPIDURAL

## 2021-03-28 NOTE — Discharge Instructions (Signed)

## 2021-03-29 ENCOUNTER — Telehealth: Payer: Self-pay

## 2021-03-29 MED ORDER — REPATHA SURECLICK 140 MG/ML ~~LOC~~ SOAJ: 140.0000 mg | SUBCUTANEOUS | 11 refills | Status: DC

## 2021-03-29 NOTE — Telephone Encounter (Signed)
Called and spoke w/pt and stated that they were approved to restart repatha since the pa is approved and dr. Martinique mentioned in her last visit to restart. Pt voiced understanding to complete fasting labs post 4th dose and to call if unaffordable.

## 2021-04-05 ENCOUNTER — Ambulatory Visit (INDEPENDENT_AMBULATORY_CARE_PROVIDER_SITE_OTHER): Payer: Medicare HMO | Admitting: Emergency Medicine

## 2021-04-05 ENCOUNTER — Encounter: Payer: Self-pay | Admitting: Emergency Medicine

## 2021-04-05 ENCOUNTER — Other Ambulatory Visit: Payer: Self-pay

## 2021-04-05 VITALS — BP 112/66 | HR 55 | Temp 98.1°F | Ht 60.0 in | Wt 157.8 lb

## 2021-04-05 DIAGNOSIS — M5136 Other intervertebral disc degeneration, lumbar region: Secondary | ICD-10-CM

## 2021-04-05 DIAGNOSIS — Z7689 Persons encountering health services in other specified circumstances: Secondary | ICD-10-CM | POA: Diagnosis not present

## 2021-04-05 DIAGNOSIS — I2510 Atherosclerotic heart disease of native coronary artery without angina pectoris: Secondary | ICD-10-CM | POA: Diagnosis not present

## 2021-04-05 DIAGNOSIS — E782 Mixed hyperlipidemia: Secondary | ICD-10-CM | POA: Diagnosis not present

## 2021-04-05 DIAGNOSIS — I10 Essential (primary) hypertension: Secondary | ICD-10-CM

## 2021-04-05 DIAGNOSIS — M2550 Pain in unspecified joint: Secondary | ICD-10-CM

## 2021-04-05 DIAGNOSIS — K2100 Gastro-esophageal reflux disease with esophagitis, without bleeding: Secondary | ICD-10-CM | POA: Diagnosis not present

## 2021-04-05 DIAGNOSIS — I358 Other nonrheumatic aortic valve disorders: Secondary | ICD-10-CM

## 2021-04-05 MED ORDER — GABAPENTIN 300 MG PO CAPS: 300.0000 mg | ORAL_CAPSULE | Freq: Every day | ORAL | 3 refills | Status: DC

## 2021-04-05 MED ORDER — PANTOPRAZOLE SODIUM 40 MG PO TBEC: 40.0000 mg | DELAYED_RELEASE_TABLET | Freq: Every day | ORAL | 3 refills | Status: DC

## 2021-04-05 NOTE — Patient Instructions (Signed)
Health Maintenance After Age 73 After age 73, you are at a higher risk for certain long-term diseases and infections as well as injuries from falls. Falls are a major cause of broken bones and head injuries in people who are older than age 73. Getting regular preventive care can help to keep you healthy and well. Preventive care includes getting regular testing and making lifestyle changes as recommended by your health care provider. Talk with your health care provider about: Which screenings and tests you should have. A screening is a test that checks for a disease when you have no symptoms. A diet and exercise plan that is right for you. What should I know about screenings and tests to prevent falls? Screening and testing are the best ways to find a health problem early. Early diagnosis and treatment give you the best chance of managing medical conditions that are common after age 73. Certain conditions and lifestyle choices may make you more likely to have a fall. Your health care provider may recommend: Regular vision checks. Poor vision and conditions such as cataracts can make you more likely to have a fall. If you wear glasses, make sure to get your prescription updated if your vision changes. Medicine review. Work with your health care provider to regularly review all of the medicines you are taking, including over-the-counter medicines. Ask your health care provider about any side effects that may make you more likely to have a fall. Tell your health care provider if any medicines that you take make you feel dizzy or sleepy. Strength and balance checks. Your health care provider may recommend certain tests to check your strength and balance while standing, walking, or changing positions. Foot health exam. Foot pain and numbness, as well as not wearing proper footwear, can make you more likely to have a fall. Screenings, including: Osteoporosis screening. Osteoporosis is a condition that causes  the bones to get weaker and break more easily. Blood pressure screening. Blood pressure changes and medicines to control blood pressure can make you feel dizzy. Depression screening. You may be more likely to have a fall if you have a fear of falling, feel depressed, or feel unable to do activities that you used to do. Alcohol use screening. Using too much alcohol can affect your balance and may make you more likely to have a fall. Follow these instructions at home: Lifestyle Do not drink alcohol if: Your health care provider tells you not to drink. If you drink alcohol: Limit how much you have to: 0-1 drink a day for women. 0-2 drinks a day for men. Know how much alcohol is in your drink. In the [place] one drink equals one 12 oz bottle of beer (355 mL), one 5 oz glass of wine (148 mL), or one 1 oz glass of hard liquor (44 mL). Do not use any products that contain nicotine or tobacco. These products include cigarettes, chewing tobacco, and vaping devices, such as e-cigarettes. If you need help quitting, ask your health care provider. Activity  Follow a regular exercise program to stay fit. This will help you maintain your balance. Ask your health care provider what types of exercise are appropriate for you. If you need a cane or walker, use it as recommended by your health care provider. Wear supportive shoes that have nonskid soles. Safety  Remove any tripping hazards, such as rugs, cords, and clutter. Install safety equipment such as grab bars in bathrooms and safety rails on stairs. Keep rooms and walkways   well-lit. General instructions Talk with your health care provider about your risks for falling. Tell your health care provider if: You fall. Be sure to tell your health care provider about all falls, even ones that seem minor. You feel dizzy, tiredness (fatigue), or off-balance. Take over-the-counter and prescription medicines only as told by your health care provider. These include  supplements. Eat a healthy diet and maintain a healthy weight. A healthy diet includes low-fat dairy products, low-fat (lean) meats, and fiber from whole grains, beans, and lots of fruits and vegetables. Stay current with your vaccines. Schedule regular health, dental, and eye exams. Summary Having a healthy lifestyle and getting preventive care can help to protect your health and wellness after age 73. Screening and testing are the best way to find a health problem early and help you avoid having a fall. Early diagnosis and treatment give you the best chance for managing medical conditions that are more common for people who are older than age 73. Falls are a major cause of broken bones and head injuries in people who are older than age 73. Take precautions to prevent a fall at home. Work with your health care provider to learn what changes you can make to improve your health and wellness and to prevent falls. This information is not intended to replace advice given to you by your health care provider. Make sure you discuss any questions you have with your health care provider. Document Revised: 06/26/2020 Document Reviewed: 06/26/2020 Elsevier Patient Education  2022 Elsevier Inc.  

## 2021-04-05 NOTE — Assessment & Plan Note (Addendum)
Courtney [name] has to take meloxicam.  Aware of NSAIDs possible side effects.  Prefers to use Tylenol.  Only uses meloxicam if severe pain.

## 2021-04-05 NOTE — Assessment & Plan Note (Signed)
Stable on daily Protonix 40 mg.

## 2021-04-05 NOTE — Assessment & Plan Note (Signed)
Intermittent recurrent pains.  Presently getting epidural injections with some success.

## 2021-04-05 NOTE — Assessment & Plan Note (Signed)
Well-controlled hypertension.  On Altace 10 mg daily and metoprolol tartrate 25 mg twice a day.

## 2021-04-05 NOTE — Assessment & Plan Note (Signed)
Stable.  On rosuvastatin, Zetia, and Repatha. The 10-year ASCVD risk score (Arnett DK, et al., 2019) is: 11.3%   Values used to calculate the score:     Age: 71 years     Sex: Female     Is Non-Hispanic African American: No     Diabetic: No     Tobacco smoker: No     Systolic Blood Pressure: 720 mmHg     Is BP treated: Yes     HDL Cholesterol: 68 mg/dL     Total Cholesterol: 164 mg/dL

## 2021-04-05 NOTE — Assessment & Plan Note (Signed)
Stable.  No need for nitroglycerin use.  Normal recent echocardiogram with good ejection fraction and no motion abnormalities. Continue daily baby aspirin and metoprolol tartrate 25 mg twice a day.

## 2021-04-05 NOTE — Progress Notes (Signed)
Courtney [name] 71 y.o.   Chief Complaint  Patient presents with   [place], FNP   Medication Refill    Gabapentin and protonix     HISTORY OF PRESENT ILLNESS: This is a 71 y.o. female former patient of Marvis Repress here to establish care with me. Has several chronic medical problems including the following: 1.  Coronary artery disease with history of ischemic heart disease status post MI 2009.  Presently on beta-blocker and daily baby aspirin.  No need for nitroglycerin since MI. Doing well.  Recent echocardiogram within normal limits except for aortic valve sclerosis.  Sees cardiologist on a regular basis. 2.  Hypertension presently on ramipril 10 mg daily. 3.  Dyslipidemia presently on Crestor, Zetia and Repatha injections 4.  Osteoarthritis with chronic low back and lower extremity pains 5.  Restless leg syndrome much better on gabapentin 6.  GERD on daily Protonix 40 mg with good results 7.  Insomnia.  Tylenol PM helps Doing well today.  Has no complaints or any other medical concerns.  Medication Refill Pertinent negatives include no abdominal pain, chest pain, chills, congestion, coughing, fever, headaches, nausea, rash, sore throat or vomiting.    Prior to Admission medications   Medication Sig Start Date End Date Taking? Authorizing Provider  acetaminophen (TYLENOL) 500 MG tablet Take 1,000 mg by mouth every 6 (six) hours as needed (Pain).   Yes [provider]  aspirin 81 MG tablet Take 81 mg by mouth daily.   Yes [provider]  dicyclomine (BENTYL) 20 MG tablet Take 1 tablet (20 mg total) by mouth every 6 (six) hours as needed for spasms. 03/27/20  Yes Marrian Salvage, FNP  Evolocumab (REPATHA SURECLICK) 762 MG/ML SOAJ Inject 140 mg into the skin every 14 (fourteen) days. 03/29/21  Yes [place], Peter M, MD  ezetimibe (ZETIA) 10 MG tablet Take 1 tablet (10 mg total) by mouth daily. 03/26/21 03/21/22 Yes [place],  Peter M, MD  gabapentin (NEURONTIN) 300 MG capsule Take 1 capsule (300 mg total) by mouth at bedtime. 05/15/20  Yes Sater, Nanine Means, MD  Melatonin 10 MG TABS Take by mouth.   Yes [provider]  meloxicam (MOBIC) 15 MG tablet Take 1 tablet (15 mg total) by mouth daily. 12/11/20  Yes Lyndal Pulley, DO  metoprolol tartrate (LOPRESSOR) 25 MG tablet Take 0.5 tablets (12.5 mg total) by mouth 2 (two) times daily. 03/26/21  Yes [place], Peter M, MD  nitroGLYCERIN (NITROSTAT) 0.4 MG SL tablet Place 1 tablet (0.4 mg total) under the tongue every 5 (five) minutes as needed for chest pain. 10/07/18  Yes [place], Peter M, MD  pantoprazole (PROTONIX) 40 MG tablet Take 1 tablet (40 mg total) by mouth daily. 04/14/20  Yes Marrian Salvage, FNP  ramipril (ALTACE) 10 MG capsule Take 1 capsule (10 mg total) by mouth daily. 09/21/20  Yes [place], Peter M, MD  rosuvastatin (CRESTOR) 40 MG tablet Take 1 tablet (40 mg total) by mouth daily. 03/26/21  Yes [place], Peter M, MD  Vitamin D, Ergocalciferol, (DRISDOL) 1.25 MG (50000 UNIT) CAPS capsule Take 1 capsule (50,000 Units total) by mouth every 7 (seven) days. 02/01/21  Yes Dennard Nip D, MD    Allergies  Allergen Reactions   Erythromycin Hives   Penicillins Hives    Has patient had a PCN reaction causing immediate rash, facial/tongue/throat swelling, SOB or lightheadedness with hypotension: No Has patient had a PCN reaction causing  severe rash involving mucus membranes or skin necrosis: No Has patient had a PCN reaction that required hospitalization: No Has patient had a PCN reaction occurring within the last 10 years: No If all of the above answers are "NO", then may proceed with Cephalosporin use.    Shellfish Allergy Hives    Patient Active Problem List   Diagnosis Date Noted   Degenerative disc disease, lumbar 05/19/2020   Restless leg syndrome 05/15/2020   Degenerative disc disease, cervical 02/02/2020   Polyarthralgia 01/03/2020    Patellofemoral arthritis of right knee 06/16/2019   Vitamin D deficiency, Vit D = 43.2 (01/25/19), Rx vit D 50K IU every 2 weeks 08/04/2018   Cyst of right breast 05/07/2018   Chronic headache disorder 02/08/2018   Insomnia 07/15/2017   Female stress incontinence 07/15/2017   Gastroesophageal reflux disease with esophagitis, Rx Protonix 02/25/2017   Osteopenia 06/27/2015   Osteoarthritis of right hip 07/08/2014   Carotid artery disease ([place])    Aortic valve sclerosis    Mixed hyperlipidemia, Rx Crestor and Zetia    Coronary artery disease     Past Medical History:  Diagnosis Date   Aortic valve sclerosis    echo 95/6387, soft systolic murmur   Arthritis    in right hip   Back pain    Basal cell carcinoma 05/31/2016   bcc left forehead  tx exc   Carotid artery disease ([facility])    40-59% bilateral, doppler December 2010   Cataract    Constipation    Coronary artery disease    a. BMS-mid RCA 11/2007 b. stress echo 02/2009 subtle inferior HK, lateral ST depressions with stress c. follow-up cath 02/2009: RCA stent patent, severe but stable stenosis of distal posterior lateral branch RCA. LV normal, med tx unless more sx c. ETT Myoview 08/27/12: negative for ischemia; EF 64%   Decreased hearing    Depression    Mild, August, 2012   Dyslipidemia    Easy bruising    Ejection fraction    EF normal, catheterization, 2011 /  EF 55%, echo, 2010   Fluid overload    Mild, stable since hospitalization in the past   GERD (gastroesophageal reflux disease)    occarionally, takes Pepcid over the counter   Heart murmur    has, occasional PVC's   History of right hip replacement    Hyperlipidemia    Hypertension    Joint pain    Myocardial infarction ([place])    2009   Palpitations    PVC (premature ventricular contraction)    per prior Holter monitor   Right hip pain 12/2009   Shellfish allergy    Stress fracture    Left foot   Trouble in sleeping    Vitamin B12 deficiency    Vitamin D  deficiency     Past Surgical History:  Procedure Laterality Date   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/01/2005   BREAST BIOPSY Left    carcinoid tumor removal     colon-   CARDIAC CATHETERIZATION  1/211   Patent RCA stent, stenotic PLB supplying small distribution; medically managed   CATARACT EXTRACTION, BILATERAL     CORONARY ANGIOPLASTY WITH STENT PLACEMENT  11/2007   BMS-mid RCA   DIAGNOSTIC LAPAROSCOPY     1982 for endometriosis   DILATION AND CURETTAGE OF UTERUS  2005   SKIN CANCER EXCISION     TOTAL HIP ARTHROPLASTY Right 07/08/2014   Procedure: RIGHT TOTAL HIP ARTHROPLASTY ANTERIOR APPROACH;  Surgeon: Mcarthur Rossetti,  MD;  Location: [facility];  Service: Orthopedics;  Laterality: Right;   WISDOM TOOTH EXTRACTION      Social History   Socioeconomic History   Marital status: Divorced    Spouse name: Not on file   Number of children: 1   Years of education: 57   Highest education level: Not on file  Occupational History   Occupation: Retired - Retail buyer  Tobacco Use   Smoking status: Never   Smokeless tobacco: Never  Vaping Use   Vaping Use: Never used  Substance and Sexual Activity   Alcohol use: Yes    Comment: 1 glass of wine 3x/week   Drug use: No   Sexual activity: Yes  Other Topics Concern   Not on file  Social History Narrative   Right handed   Soda- sometimes   Drinks herbal tea   Fun: Dance, garden,    Denies religious beliefs effecting health care.   Denies abuse and feels safe at home   Social Determinants of Health   Financial Resource Strain: Not on file  Food Insecurity: Not on file  Transportation Needs: Not on file  Physical Activity: Not on file  Stress: Not on file  Social Connections: Not on file  Intimate Partner Violence: Not on file    Family History  Problem Relation Age of Onset   Heart attack Father 65   Hyperlipidemia Father    Hypertension Father    Sudden death Father    Stroke Mother    Hyperlipidemia Mother     Hypertension Mother    Heart disease Mother    Obesity Mother    Heart attack Paternal Grandfather    Heart disease Sister    BRCA 1/2 Neg Hx    Breast cancer Neg Hx    Esophageal cancer Neg Hx    Colon cancer Neg Hx    Pancreatic cancer Neg Hx    Liver disease Neg Hx    Stomach cancer Neg Hx      Review of Systems  Constitutional: Negative.  Negative for chills and fever.  HENT: Negative.  Negative for congestion and sore throat.   Respiratory: Negative.  Negative for cough and shortness of breath.   Cardiovascular: Negative.  Negative for chest pain and palpitations.  Gastrointestinal: Negative.  Negative for abdominal pain, nausea and vomiting.  Genitourinary: Negative.   Skin: Negative.  Negative for rash.  Neurological: Negative.  Negative for dizziness and headaches.  All other systems reviewed and are negative.  Today's Vitals   04/05/21 0852  BP: 112/66  Pulse: (!) 55  Temp: 98.1 F (36.7 C)  TempSrc: Oral  SpO2: 98%  Weight: 157 lb 12.8 oz (71.6 kg)  Height: 5' (1.524 m)   Body mass index is 30.82 kg/m.  Physical Exam Vitals reviewed.  Constitutional:      Appearance: Normal appearance.  HENT:     Head: Normocephalic.     Mouth/Throat:     Mouth: Mucous membranes are moist.     Pharynx: Oropharynx is clear.  Eyes:     Extraocular Movements: Extraocular movements intact.     Conjunctiva/sclera: Conjunctivae normal.     Pupils: Pupils are equal, round, and reactive to light.  Cardiovascular:     Rate and Rhythm: Normal rate and regular rhythm.     Heart sounds: Murmur (Systolic grade 3/6 at aortic area) heard.  Pulmonary:     Effort: Pulmonary effort is normal.     Breath sounds: Normal breath  sounds.  Musculoskeletal:        General: Normal range of motion.     Cervical back: No tenderness.     Right lower leg: No edema.     Left lower leg: No edema.  Lymphadenopathy:     Cervical: No cervical adenopathy.  Skin:    General: Skin is warm and  dry.     Capillary Refill: Capillary refill takes less than 2 seconds.  Neurological:     General: No focal deficit present.     Mental Status: She is alert and oriented to person, place, and time.  Psychiatric:        Mood and Affect: Mood normal.        Behavior: Behavior normal.     ASSESSMENT & PLAN: A total of 50 minutes was spent with the patient and counseling/coordination of care regarding preparing for this visit, establishing care with me, review of most recent office visit notes, review of most recent cardiologist office visit note, review of most recent echocardiogram report, review of most recent blood work results, review of multiple chronic medical problems and their management, review of all medications, education on nutrition, health maintenance items, prognosis, documentation and need for follow-up.  Problem List Items Addressed This Visit       Cardiovascular and Mediastinum   Aortic valve sclerosis    Stable without hemodynamic compromise.      Coronary artery disease    Stable.  No need for nitroglycerin use.  Normal recent echocardiogram with good ejection fraction and no motion abnormalities. Continue daily baby aspirin and metoprolol tartrate 25 mg twice a day.      Essential hypertension - Primary    Well-controlled hypertension.  On Altace 10 mg daily and metoprolol tartrate 25 mg twice a day.        Digestive   Gastroesophageal reflux disease with esophagitis, Rx Protonix    Stable on daily Protonix 40 mg.        Musculoskeletal and Integument   Degenerative disc disease, lumbar    Intermittent recurrent pains.  Presently getting epidural injections with some success.        Other   Mixed hyperlipidemia, Rx Crestor and Zetia    Stable.  On rosuvastatin, Zetia, and Repatha. The 10-year ASCVD risk score (Arnett DK, et al., 2019) is: 11.3%   Values used to calculate the score:     Age: 20 years     Sex: Female     Is Non-Hispanic African  American: No     Diabetic: No     Tobacco smoker: No     Systolic Blood Pressure: 937 mmHg     Is BP treated: Yes     HDL Cholesterol: 68 mg/dL     Total Cholesterol: 164 mg/dL       Polyarthralgia    Ocassionally has to take meloxicam.  Aware of NSAIDs possible side effects.  Prefers to use Tylenol.  Only uses meloxicam if severe pain.      Other Visit Diagnoses     Encounter to establish care          Patient Instructions  Health Maintenance After Age 52 After age 61, you are at a higher risk for certain long-term diseases and infections as well as injuries from falls. Falls are a major cause of broken bones and head injuries in people who are older than age 11. Getting regular preventive care can help to keep you healthy and well. Preventive care  includes getting regular testing and making lifestyle changes as recommended by your health care provider. Talk with your health care provider about: Which screenings and tests you should have. A screening is a test that checks for a disease when you have no symptoms. A diet and exercise plan that is right for you. What should I know about screenings and tests to prevent falls? Screening and testing are the best ways to find a health problem early. Early diagnosis and treatment give you the best chance of managing medical conditions that are common after age 95. Certain conditions and lifestyle choices may make you more likely to have a fall. Your health care provider may recommend: Regular vision checks. Poor vision and conditions such as cataracts can make you more likely to have a fall. If you wear glasses, make sure to get your prescription updated if your vision changes. Medicine review. Work with your health care provider to regularly review all of the medicines you are taking, including over-the-counter medicines. Ask your health care provider about any side effects that may make you more likely to have a fall. Tell your health care  provider if any medicines that you take make you feel dizzy or sleepy. Strength and balance checks. Your health care provider may recommend certain tests to check your strength and balance while standing, walking, or changing positions. Foot health exam. Foot pain and numbness, as well as not wearing proper footwear, can make you more likely to have a fall. Screenings, including: Osteoporosis screening. Osteoporosis is a condition that causes the bones to get weaker and break more easily. Blood pressure screening. Blood pressure changes and medicines to control blood pressure can make you feel dizzy. Depression screening. You may be more likely to have a fall if you have a fear of falling, feel depressed, or feel unable to do activities that you used to do. Alcohol use screening. Using too much alcohol can affect your balance and may make you more likely to have a fall. Follow these instructions at home: Lifestyle Do not drink alcohol if: Your health care provider tells you not to drink. If you drink alcohol: Limit how much you have to: 0-1 drink a day for women. 0-2 drinks a day for men. Know how much alcohol is in your drink. In the [place] one drink equals one 12 oz bottle of beer (355 mL), one 5 oz glass of wine (148 mL), or one 1 oz glass of hard liquor (44 mL). Do not use any products that contain nicotine or tobacco. These products include cigarettes, chewing tobacco, and vaping devices, such as e-cigarettes. If you need help quitting, ask your health care provider. Activity  Follow a regular exercise program to stay fit. This will help you maintain your balance. Ask your health care provider what types of exercise are appropriate for you. If you need a cane or walker, use it as recommended by your health care provider. Wear supportive shoes that have nonskid soles. Safety  Remove any tripping hazards, such as rugs, cords, and clutter. Install safety equipment such as grab bars in  bathrooms and safety rails on stairs. Keep rooms and walkways well-lit. General instructions Talk with your health care provider about your risks for falling. Tell your health care provider if: You fall. Be sure to tell your health care provider about all falls, even ones that seem minor. You feel dizzy, tiredness (fatigue), or off-balance. Take over-the-counter and prescription medicines only as told by your health care provider.  These include supplements. Eat a healthy diet and maintain a healthy weight. A healthy diet includes low-fat dairy products, low-fat (lean) meats, and fiber from whole grains, beans, and lots of fruits and vegetables. Stay current with your vaccines. Schedule regular health, dental, and eye exams. Summary Having a healthy lifestyle and getting preventive care can help to protect your health and wellness after age 74. Screening and testing are the best way to find a health problem early and help you avoid having a fall. Early diagnosis and treatment give you the best chance for managing medical conditions that are more common for people who are older than age 66. Falls are a major cause of broken bones and head injuries in people who are older than age 101. Take precautions to prevent a fall at home. Work with your health care provider to learn what changes you can make to improve your health and wellness and to prevent falls. This information is not intended to replace advice given to you by your health care provider. Make sure you discuss any questions you have with your health care provider. Document Revised: 06/26/2020 Document Reviewed: 06/26/2020 Elsevier Patient Education  2022 [place], MD [facility]

## 2021-04-05 NOTE — Assessment & Plan Note (Signed)
Stable without hemodynamic compromise.

## 2021-04-18 ENCOUNTER — Ambulatory Visit: Payer: Medicare HMO

## 2021-04-19 ENCOUNTER — Encounter: Payer: Self-pay | Admitting: Emergency Medicine

## 2021-04-19 ENCOUNTER — Other Ambulatory Visit: Payer: Self-pay | Admitting: Emergency Medicine

## 2021-04-19 DIAGNOSIS — L509 Urticaria, unspecified: Secondary | ICD-10-CM

## 2021-04-19 MED ORDER — METHYLPREDNISOLONE 4 MG PO TBPK: ORAL_TABLET | ORAL | 1 refills | Status: DC

## 2021-04-23 ENCOUNTER — Other Ambulatory Visit: Payer: Self-pay

## 2021-04-23 ENCOUNTER — Encounter (INDEPENDENT_AMBULATORY_CARE_PROVIDER_SITE_OTHER): Payer: Self-pay | Admitting: Physician Assistant

## 2021-04-23 ENCOUNTER — Ambulatory Visit (INDEPENDENT_AMBULATORY_CARE_PROVIDER_SITE_OTHER): Payer: Medicare HMO | Admitting: Physician Assistant

## 2021-04-23 VITALS — BP 148/59 | HR 51 | Temp 97.5°F | Ht 60.0 in | Wt 156.0 lb

## 2021-04-23 DIAGNOSIS — Z6830 Body mass index (BMI) 30.0-30.9, adult: Secondary | ICD-10-CM | POA: Diagnosis not present

## 2021-04-23 DIAGNOSIS — R7303 Prediabetes: Secondary | ICD-10-CM

## 2021-04-23 DIAGNOSIS — E669 Obesity, unspecified: Secondary | ICD-10-CM

## 2021-04-23 DIAGNOSIS — E559 Vitamin D deficiency, unspecified: Secondary | ICD-10-CM

## 2021-04-25 NOTE — Progress Notes (Signed)
? ? ? ?Chief Complaint:  ? ?OBESITY ?Niveah is here to discuss her progress with her obesity treatment plan along with follow-up of her obesity related diagnoses. Rashema is on the Category 1 Plan and states she is following her eating plan approximately 80% of the time. Skylyn states she is going to the gym, swimming and walking for  60 minutes 3 times per week. ? ?Today's visit was #: 53 ?Starting weight: 157 lbs ?Starting date: 06/16/2017 ?Today's weight: 156 lbs ?Today's date: 04/23/2021 ?Total lbs lost to date: 1 lb ?Total lbs lost since last in-office visit: 1 lb ? ?Interim History: Jazzmin has been on prednisone for Poison Ivy for the last 4 days. She has been cooking more often at home. She eats more than a cup of vegetables most days, and sometimes more fruit.  ? ?Subjective:  ? ?1. Vitamin D deficiency ?Maymuna is on Vitamin D weekly. Her last Vitamin D level was 79.4. It was increased from 64.5. ? ?2. Pre-diabetes ?Teala's last A1C was 6.0, increased from 5.8. She is currently not on medications. She reports that for 2 months prior to labs their floor was being redone and she was off plan often.  ? ?Assessment/Plan:  ? ?1. Vitamin D deficiency ?Low Vitamin D level contributes to fatigue and are associated with obesity, breast, and colon cancer. Korrie agrees to change prescription Vitamin D 50,000 IU every 10 days and she will follow-up for routine testing of Vitamin D, at least 2-3 times per year to avoid over-replacement. We discussed labs today. ? ?2. Pre-diabetes ?Suanne will decrease simple carbohydrates. She will continue to work on exercise and the plan. Her pre-diabetes is worsening. We discussed labs today.  ? ?3. Obesity with current BMI of 30.47 ?Lashon is currently in the action stage of change. As such, her goal is to continue with weight loss efforts. She has agreed to the Category 1 Plan.  ? ?Exercise goals:  Amberli will add resistance training 2 days per week.  ? ?Behavioral modification strategies:  increasing lean protein intake and meal planning and cooking strategies. ? ?Ellerie has agreed to follow-up with our clinic in 4 weeks. She was informed of the importance of frequent follow-up visits to maximize her success with intensive lifestyle modifications for her multiple health conditions.  ? ?Objective:  ? ?Blood pressure (!) 148/59, pulse (!) 51, temperature (!) 97.5 ?F (36.4 ?C), height 5' (1.524 m), weight 156 lb (70.8 kg), SpO2 98 %. ?Body mass index is 30.47 kg/m?. ? ?General: Cooperative, alert, well developed, in no acute distress. ?HEENT: Conjunctivae and lids unremarkable. ?Cardiovascular: Regular rhythm.  ?Lungs: Normal work of breathing. ?Neurologic: No focal deficits.  ? ?Lab Results  ?Component Value Date  ? CREATININE 0.89 03/20/2021  ? BUN 26 03/20/2021  ? NA 141 03/20/2021  ? K 4.9 03/20/2021  ? CL 103 03/20/2021  ? CO2 22 03/20/2021  ? ?Lab Results  ?Component Value Date  ? ALT 19 03/20/2021  ? AST 22 03/20/2021  ? ALKPHOS 76 03/20/2021  ? BILITOT 0.2 03/20/2021  ? ?Lab Results  ?Component Value Date  ? HGBA1C 6.0 (H) 03/20/2021  ? HGBA1C 5.8 (H) 08/30/2020  ? HGBA1C 5.8 (H) 03/30/2020  ? HGBA1C 5.6 12/01/2019  ? HGBA1C 5.7 (H) 08/04/2019  ? ?Lab Results  ?Component Value Date  ? INSULIN 8.2 03/20/2021  ? INSULIN 6.8 08/30/2020  ? INSULIN 9.4 03/30/2020  ? INSULIN 7.2 12/01/2019  ? INSULIN 4.9 08/04/2019  ? ?Lab Results  ?Component Value  Date  ? TSH 1.75 01/03/2020  ? ?Lab Results  ?Component Value Date  ? CHOL 164 03/20/2021  ? HDL 68 03/20/2021  ? [place] 82 03/20/2021  ? TRIG 71 03/20/2021  ? CHOLHDL 2.3 10/25/2020  ? ?Lab Results  ?Component Value Date  ? VD25OH 79.4 03/20/2021  ? VD25OH 64.5 08/30/2020  ? VD25OH 62.7 03/30/2020  ? ?Lab Results  ?Component Value Date  ? WBC 7.3 01/03/2020  ? HGB 13.0 01/03/2020  ? HCT 37.7 01/03/2020  ? MCV 91.5 01/03/2020  ? PLT 196.0 01/03/2020  ? ?Lab Results  ?Component Value Date  ? IRON 117 10/17/2020  ? TIBC 431.2 10/17/2020  ? FERRITIN 133.2  10/17/2020  ? ? ?Obesity Behavioral Intervention:  ? ?Approximately 15 minutes were spent on the discussion below. ? ?ASK: ?We discussed the diagnosis of obesity with [name] today and Jamaira agreed to give [place] permission to discuss obesity behavioral modification therapy today. ? ?ASSESS: ?Hiawatha has the diagnosis of obesity and her BMI today is 30.5. Mykala is in the action stage of change.  ? ?ADVISE: ?Kynlee was educated on the multiple health risks of obesity as well as the benefit of weight loss to improve her health. She was advised of the need for long term treatment and the importance of lifestyle modifications to improve her current health and to decrease her risk of future health problems. ? ?AGREE: ?Multiple dietary modification options and treatment options were discussed and Sommer agreed to follow the recommendations documented in the above note. ? ?ARRANGE: ?Donnelle was educated on the importance of frequent visits to treat obesity as outlined per CMS and USPSTF guidelines and agreed to schedule her next follow up appointment today. ? ?Attestation Statements:  ? ?Reviewed by clinician on day of visit: allergies, medications, problem list, medical history, surgical history, family history, social history, and previous encounter notes. ? ?I, Tonye Pearson, am acting as Location manager for [employer], PA-C. ? ?I have reviewed the above documentation for accuracy and completeness, and I agree with the above. Abby Potash, PA-C ? ?

## 2021-04-27 ENCOUNTER — Other Ambulatory Visit: Payer: Self-pay | Admitting: Obstetrics and Gynecology

## 2021-04-27 DIAGNOSIS — Z1231 Encounter for screening mammogram for malignant neoplasm of breast: Secondary | ICD-10-CM

## 2021-04-27 NOTE — Progress Notes (Signed)
Courtney [name] [address] [place] Phone: [phone] Subjective:   Courtney [name], am serving as a scribe for Dr. Hulan Saas. This visit occurred during the SARS-CoV-2 public health emergency.  Safety protocols were in place, including screening questions prior to the visit, additional usage of staff PPE, and extensive cleaning of exam room while observing appropriate contact time as indicated for disinfecting solutions.   I'm seeing this patient by the request  of:  [name], Ines Bloomer, MD  CC: low back pain follow up   [id number]  Courtney [name] is a 71 y.o. female coming in with complaint of lumbar spine pain. Epidural on 03/28/2021. Last seen in October for L foot stress fx. Patient states doing well since epidural. No new complaints.       Past Medical History:  Diagnosis Date   Aortic valve sclerosis    echo 29/0211, soft systolic murmur   Arthritis    in right hip   Back pain    Basal cell carcinoma 05/31/2016   bcc left forehead  tx exc   Carotid artery disease (HCC)    40-59% bilateral, doppler December 2010   Cataract    Constipation    Coronary artery disease    a. BMS-mid RCA 11/2007 b. stress echo 02/2009 subtle inferior HK, lateral ST depressions with stress c. follow-up cath 02/2009: RCA stent patent, severe but stable stenosis of distal posterior lateral branch RCA. LV normal, med tx unless more sx c. ETT Myoview 08/27/12: negative for ischemia; EF 64%   Decreased hearing    Depression    Mild, August, 2012   Dyslipidemia    Easy bruising    Ejection fraction    EF normal, catheterization, 2011 /  EF 55%, echo, 2010   Fluid overload    Mild, stable since hospitalization in the past   GERD (gastroesophageal reflux disease)    occarionally, takes Pepcid over the counter   Heart murmur    has, occasional PVC's   History of right hip replacement    Hyperlipidemia    Hypertension    Joint pain     Myocardial infarction ([place])    2009   Palpitations    PVC (premature ventricular contraction)    per prior Holter monitor   Right hip pain 12/2009   Shellfish allergy    Stress fracture    Left foot   Trouble in sleeping    Vitamin B12 deficiency    Vitamin D deficiency    Past Surgical History:  Procedure Laterality Date   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/01/2005   BREAST BIOPSY Left    carcinoid tumor removal     colon-   CARDIAC CATHETERIZATION  1/211   Patent RCA stent, stenotic PLB supplying small distribution; medically managed   CATARACT EXTRACTION, BILATERAL     CORONARY ANGIOPLASTY WITH STENT PLACEMENT  11/2007   BMS-mid RCA   DIAGNOSTIC LAPAROSCOPY     1982 for endometriosis   DILATION AND CURETTAGE OF UTERUS  2005   SKIN CANCER EXCISION     TOTAL HIP ARTHROPLASTY Right 07/08/2014   Procedure: RIGHT TOTAL HIP ARTHROPLASTY ANTERIOR APPROACH;  Surgeon: Mcarthur Rossetti, MD;  Location: [facility];  Service: Orthopedics;  Laterality: Right;   WISDOM TOOTH EXTRACTION     Social History   Socioeconomic History   Marital status: Divorced    Spouse name: Not on file   Number of children: 1   Years of  education: 18   Highest education level: Not on file  Occupational History   Occupation: Retired Research scientist (life sciences)  Tobacco Use   Smoking status: Never   Smokeless tobacco: Never  Vaping Use   Vaping Use: Never used  Substance and Sexual Activity   Alcohol use: Yes    Comment: 1 glass of wine 3x/week   Drug use: No   Sexual activity: Yes  Other Topics Concern   Not on file  Social History Narrative   Right handed   Soda- sometimes   Drinks herbal tea   Fun: Dance, garden,    Denies religious beliefs effecting health care.   Denies abuse and feels safe at home   Social Determinants of Health   Financial Resource Strain: Not on file  Food Insecurity: Not on file  Transportation Needs: Not on file  Physical Activity: Not on file  Stress: Not on file  Social  Connections: Not on file   Allergies  Allergen Reactions   Erythromycin Hives   Penicillins Hives    Has patient had a PCN reaction causing immediate rash, facial/tongue/throat swelling, SOB or lightheadedness with hypotension: No Has patient had a PCN reaction causing severe rash involving mucus membranes or skin necrosis: No Has patient had a PCN reaction that required hospitalization: No Has patient had a PCN reaction occurring within the last 10 years: No If all of the above answers are "NO", then may proceed with Cephalosporin use.    Shellfish Allergy Hives   Family History  Problem Relation Age of Onset   Heart attack Father 22   Hyperlipidemia Father    Hypertension Father    Sudden death Father    Stroke Mother    Hyperlipidemia Mother    Hypertension Mother    Heart disease Mother    Obesity Mother    Heart attack Paternal Grandfather    Heart disease Sister    BRCA 1/2 Neg Hx    Breast cancer Neg Hx    Esophageal cancer Neg Hx    Colon cancer Neg Hx    Pancreatic cancer Neg Hx    Liver disease Neg Hx    Stomach cancer Neg Hx     Current Outpatient Medications (Endocrine & Metabolic):    methylPREDNISolone (MEDROL DOSEPAK) 4 MG TBPK tablet, Sig as indicated  Current Outpatient Medications (Cardiovascular):    Evolocumab (REPATHA SURECLICK) 696 MG/ML SOAJ, Inject 140 mg into the skin every 14 (fourteen) days.   ezetimibe (ZETIA) 10 MG tablet, Take 1 tablet (10 mg total) by mouth daily.   metoprolol tartrate (LOPRESSOR) 25 MG tablet, Take 0.5 tablets (12.5 mg total) by mouth 2 (two) times daily.   nitroGLYCERIN (NITROSTAT) 0.4 MG SL tablet, Place 1 tablet (0.4 mg total) under the tongue every 5 (five) minutes as needed for chest pain.   ramipril (ALTACE) 10 MG capsule, Take 1 capsule (10 mg total) by mouth daily.   rosuvastatin (CRESTOR) 40 MG tablet, Take 1 tablet (40 mg total) by mouth daily.   Current Outpatient Medications (Analgesics):    acetaminophen  (TYLENOL) 500 MG tablet, Take 1,000 mg by mouth every 6 (six) hours as needed (Pain).   aspirin 81 MG tablet, Take 81 mg by mouth daily.   meloxicam (MOBIC) 15 MG tablet, Take 1 tablet (15 mg total) by mouth daily.   Current Outpatient Medications (Other):    dicyclomine (BENTYL) 20 MG tablet, Take 1 tablet (20 mg total) by mouth every 6 (six) hours as needed  for spasms.   gabapentin (NEURONTIN) 300 MG capsule, Take 1 capsule (300 mg total) by mouth at bedtime.   Melatonin 10 MG TABS, Take by mouth.   pantoprazole (PROTONIX) 40 MG tablet, Take 1 tablet (40 mg total) by mouth daily.   Vitamin D, Ergocalciferol, (DRISDOL) 1.25 MG (50000 UNIT) CAPS capsule, Take 1 capsule (50,000 Units total) by mouth every 7 (seven) days.   Reviewed prior external information including notes and imaging from  primary care provider As well as notes that were available from care everywhere and other healthcare systems.  Past medical history, social, surgical and family history all reviewed in electronic medical record.  No pertanent information unless stated regarding to the chief complaint.   Review of Systems:  No headache, visual changes, nausea, vomiting, diarrhea, constipation, dizziness, abdominal pain, skin rash, fevers, chills, night sweats, weight loss, swollen lymph nodes, body aches, joint swelling, chest pain, shortness of breath, mood changes. POSITIVE muscle aches  Objective  Blood pressure 116/68, pulse 62, height 5' (1.524 m), weight 169 lb (76.7 kg), SpO2 96 %.   General: No apparent distress alert and oriented x3 mood and affect normal, dressed appropriately.  HEENT: Pupils equal, extraocular movements intact  Respiratory: Patient's speak in full sentences and does not appear short of breath  Cardiovascular: No lower extremity edema, non tender, no erythema  Gait normal with good balance and coordination.  MSK: Low back exam very mild loss lordosis.  Nontender on exam.  Still has some  limitation of the right knee though in certain range of motion.  Neurovascular intact distally.    Impression and Recommendations:     The above documentation has been reviewed and is accurate and complete Lyndal Pulley, DO

## 2021-04-30 ENCOUNTER — Ambulatory Visit: Payer: Medicare HMO | Admitting: Family Medicine

## 2021-04-30 ENCOUNTER — Other Ambulatory Visit: Payer: Self-pay

## 2021-04-30 DIAGNOSIS — M5136 Other intervertebral disc degeneration, lumbar region: Secondary | ICD-10-CM | POA: Diagnosis not present

## 2021-04-30 NOTE — Assessment & Plan Note (Signed)
Responding well to the epidural at this time.  Really having no significant discomfort at all.  Patient can increase activity as tolerated.  Discussed icing regimen and home exercises.  Follow-up again in 6 to 8 weeks if needed ?

## 2021-04-30 NOTE — Patient Instructions (Signed)
Good to see you! ?So glad you're doing well ?Ice after long day in yard ?Eat within 45 mins of working out ?See me when you need, or write [place] if you need another epidural ?

## 2021-05-15 DIAGNOSIS — E782 Mixed hyperlipidemia: Secondary | ICD-10-CM | POA: Diagnosis not present

## 2021-05-15 LAB — HEPATIC FUNCTION PANEL
ALT: 16 IU/L (ref 0–32)
AST: 22 IU/L (ref 0–40)
Albumin: 4.7 g/dL (ref 3.7–4.7)
Alkaline Phosphatase: 66 IU/L (ref 44–121)
Bilirubin Total: 0.3 mg/dL (ref 0.0–1.2)
Bilirubin, Direct: 0.13 mg/dL (ref 0.00–0.40)
Total Protein: 6.9 g/dL (ref 6.0–8.5)

## 2021-05-15 LAB — LIPID PANEL
Chol/HDL Ratio: 1.6 ratio (ref 0.0–4.4)
Cholesterol, Total: 131 mg/dL (ref 100–199)
HDL: 82 mg/dL (ref >39)
LDL Chol Calc (NIH): 34 mg/dL (ref 0–99)
Triglycerides: 77 mg/dL (ref 0–149)
VLDL Cholesterol Cal: 15 mg/dL (ref 5–40)

## 2021-05-22 ENCOUNTER — Ambulatory Visit (INDEPENDENT_AMBULATORY_CARE_PROVIDER_SITE_OTHER): Payer: Medicare HMO | Admitting: Family Medicine

## 2021-05-23 ENCOUNTER — Ambulatory Visit
Admission: RE | Admit: 2021-05-23 | Discharge: 2021-05-23 | Disposition: A | Discharge status: Home / Self Care | Payer: Medicare HMO | Source: Ambulatory Visit | Attending: Obstetrics and Gynecology | Admitting: Obstetrics and Gynecology

## 2021-05-23 DIAGNOSIS — Z1231 Encounter for screening mammogram for malignant neoplasm of breast: Secondary | ICD-10-CM | POA: Diagnosis not present

## 2021-06-11 ENCOUNTER — Other Ambulatory Visit: Payer: Self-pay | Admitting: Neurology

## 2021-06-18 ENCOUNTER — Ambulatory Visit (INDEPENDENT_AMBULATORY_CARE_PROVIDER_SITE_OTHER): Payer: Medicare HMO | Admitting: Family Medicine

## 2021-06-27 DIAGNOSIS — F4322 Adjustment disorder with anxiety: Secondary | ICD-10-CM | POA: Diagnosis not present

## 2021-06-27 DIAGNOSIS — Z79899 Other long term (current) drug therapy: Secondary | ICD-10-CM | POA: Diagnosis not present

## 2021-06-29 DIAGNOSIS — F4322 Adjustment disorder with anxiety: Secondary | ICD-10-CM | POA: Diagnosis not present

## 2021-07-02 ENCOUNTER — Ambulatory Visit: Payer: Medicare HMO | Admitting: Dermatology

## 2021-07-02 DIAGNOSIS — L738 Other specified follicular disorders: Secondary | ICD-10-CM | POA: Diagnosis not present

## 2021-07-02 DIAGNOSIS — Z1283 Encounter for screening for malignant neoplasm of skin: Secondary | ICD-10-CM | POA: Diagnosis not present

## 2021-07-02 DIAGNOSIS — Z85828 Personal history of other malignant neoplasm of skin: Secondary | ICD-10-CM

## 2021-07-02 DIAGNOSIS — L237 Allergic contact dermatitis due to plants, except food: Secondary | ICD-10-CM | POA: Diagnosis not present

## 2021-07-02 DIAGNOSIS — L720 Epidermal cyst: Secondary | ICD-10-CM

## 2021-07-02 MED ORDER — CLOBETASOL PROPIONATE 0.05 % EX CREA: TOPICAL_CREAM | CUTANEOUS | 1 refills | Status: DC

## 2021-07-11 ENCOUNTER — Ambulatory Visit (INDEPENDENT_AMBULATORY_CARE_PROVIDER_SITE_OTHER): Payer: Medicare HMO | Admitting: Family Medicine

## 2021-07-11 ENCOUNTER — Encounter (INDEPENDENT_AMBULATORY_CARE_PROVIDER_SITE_OTHER): Payer: Self-pay | Admitting: Family Medicine

## 2021-07-11 VITALS — BP 127/70 | HR 54 | Temp 97.3°F | Ht 60.0 in | Wt 156.0 lb

## 2021-07-11 DIAGNOSIS — Z6830 Body mass index (BMI) 30.0-30.9, adult: Secondary | ICD-10-CM | POA: Diagnosis not present

## 2021-07-11 DIAGNOSIS — R7303 Prediabetes: Secondary | ICD-10-CM

## 2021-07-11 DIAGNOSIS — E669 Obesity, unspecified: Secondary | ICD-10-CM

## 2021-07-11 DIAGNOSIS — E559 Vitamin D deficiency, unspecified: Secondary | ICD-10-CM

## 2021-07-11 DIAGNOSIS — E785 Hyperlipidemia, unspecified: Secondary | ICD-10-CM | POA: Diagnosis not present

## 2021-07-11 DIAGNOSIS — I10 Essential (primary) hypertension: Secondary | ICD-10-CM

## 2021-07-11 MED ORDER — VITAMIN D (ERGOCALCIFEROL) 1.25 MG (50000 UNIT) PO CAPS: 50000.0000 [IU] | ORAL_CAPSULE | ORAL | 0 refills | Status: DC

## 2021-07-12 LAB — CMP14+EGFR
ALT: 20 IU/L (ref 0–32)
AST: 23 IU/L (ref 0–40)
Albumin/Globulin Ratio: 2.2 (ref 1.2–2.2)
Albumin: 4.6 g/dL (ref 3.7–4.7)
Alkaline Phosphatase: 70 IU/L (ref 44–121)
BUN/Creatinine Ratio: 24 (ref 12–28)
BUN: 21 mg/dL (ref 8–27)
Bilirubin Total: 0.4 mg/dL (ref 0.0–1.2)
CO2: 25 mmol/L (ref 20–29)
Calcium: 9.6 mg/dL (ref 8.7–10.3)
Chloride: 103 mmol/L (ref 96–106)
Creatinine, Ser: 0.88 mg/dL (ref 0.57–1.00)
Globulin, Total: 2.1 g/dL (ref 1.5–4.5)
Glucose: 93 mg/dL (ref 70–99)
Potassium: 4.8 mmol/L (ref 3.5–5.2)
Sodium: 141 mmol/L (ref 134–144)
Total Protein: 6.7 g/dL (ref 6.0–8.5)
eGFR: 70 mL/min/{1.73_m2} (ref >59)

## 2021-07-12 LAB — LIPID PANEL WITH LDL/HDL RATIO
Cholesterol, Total: 125 mg/dL (ref 100–199)
HDL: 76 mg/dL (ref >39)
LDL Chol Calc (NIH): 32 mg/dL (ref 0–99)
LDL/HDL Ratio: 0.4 ratio (ref 0.0–3.2)
Triglycerides: 92 mg/dL (ref 0–149)
VLDL Cholesterol Cal: 17 mg/dL (ref 5–40)

## 2021-07-12 LAB — HEMOGLOBIN A1C
Est. average glucose Bld gHb Est-mCnc: 117 mg/dL
Hgb A1c MFr Bld: 5.7 % — ABNORMAL HIGH (ref 4.8–5.6)

## 2021-07-12 LAB — INSULIN, RANDOM: INSULIN: 9 u[IU]/mL (ref 2.6–24.9)

## 2021-07-12 LAB — VITAMIN D 25 HYDROXY (VIT D DEFICIENCY, FRACTURES): Vit D, 25-Hydroxy: 63.1 ng/mL (ref 30.0–100.0)

## 2021-07-12 LAB — TSH: TSH: 1.61 u[IU]/mL (ref 0.450–4.500)

## 2021-07-16 ENCOUNTER — Encounter: Payer: Self-pay | Admitting: Dermatology

## 2021-07-16 NOTE — Progress Notes (Signed)
   Follow-Up Visit   Subjective  Courtney [name] is a 71 y.o. female who presents for the following: Annual Exam (Here for annual skin exam. Concerns possible milia on face. History of non mole skin cancers. ).  General skin check, several issues to discuss Location:  Duration:  Quality:  Associated Signs/Symptoms: Modifying Factors:  Severity:  Timing: Context:   Objective  Well appearing patient in no apparent distress; mood and affect are within normal limits. No atypical nevi or signs of NMSC noted at the time of the visit. SGHP on the face,  Legs have Seb K all stable  Left Lower Leg - Anterior Patchy linear subacute dermatitis, present for days  Mid Forehead 2 mm flesh-colored papules with eccentric dell, compatible dermoscopy  Neck - Anterior Discussed the benign nature of milia that removal was her choice and might be less expensive at a medical spa    A full examination was performed including scalp, head, eyes, ears, nose, lips, neck, chest, axillae, abdomen, back, buttocks, bilateral upper extremities, bilateral lower extremities, hands, feet, fingers, toes, fingernails, and toenails. All findings within normal limits unless otherwise noted below.  Areas beneath undergarments not fully examined.   Assessment & Plan    Screening exam for skin cancer  Annual skin examination.  Encouraged to self examine twice annually.  Allergic dermatitis due to poison oak Left Lower Leg - Anterior  Topical clobetasol cream or the option of covering it with a moist wrap for 20 to 30 minutes once or twice daily.  Call next week if not clearing  clobetasol cream (TEMOVATE) 0.05 % - Left Lower Leg - Anterior Apply 1-2 times a day to poison oak for 2 weeks  Sebaceous hyperplasia Mid Forehead  Told of similar appearance of early BCC so if any lesion grows or bleeds return for biopsy  Milia Neck - Anterior  Patient will decide if she wants removal of the in our  office.      I, Lavonna Monarch, MD, have reviewed all documentation for this visit.  The documentation on 07/16/21 for the exam, diagnosis, procedures, and orders are all accurate and complete.

## 2021-07-19 ENCOUNTER — Other Ambulatory Visit: Payer: Self-pay | Admitting: Family Medicine

## 2021-07-19 ENCOUNTER — Encounter: Payer: Self-pay | Admitting: Family Medicine

## 2021-07-19 DIAGNOSIS — M5136 Other intervertebral disc degeneration, lumbar region: Secondary | ICD-10-CM

## 2021-07-20 DIAGNOSIS — F4322 Adjustment disorder with anxiety: Secondary | ICD-10-CM | POA: Diagnosis not present

## 2021-07-23 NOTE — Progress Notes (Unsigned)
Chief Complaint:   OBESITY Courtney [name] is here to discuss her progress with her obesity treatment plan along with follow-up of her obesity related diagnoses. Courtney [name] is on {MWMwtlossportion/plan2:23431} and states she is following her eating plan approximately ***% of the time. Courtney [name] states she is *** *** minutes *** times per week.  Today's visit was #: *** Starting weight: *** Starting date: *** Today's weight: *** Today's date: 07/11/2021 Total lbs lost to date: *** Total lbs lost since last in-office visit: ***  Interim History: ***  Subjective:   1. Pre-diabetes ***  2. Hyperlipidemia, unspecified hyperlipidemia type ***  3. Essential hypertension ***  4. Vitamin D deficiency ***  Assessment/Plan:   1. Pre-diabetes *** - Insulin, random - Hemoglobin A1c  2. Hyperlipidemia, unspecified hyperlipidemia type *** - Lipid Panel With LDL/HDL Ratio - TSH  3. Essential hypertension *** - CMP14+EGFR  4. Vitamin D deficiency *** - VITAMIN D 25 Hydroxy (Vit-D Deficiency, Fractures) - Vitamin D, Ergocalciferol, (DRISDOL) 1.25 MG (50000 UNIT) CAPS capsule; Take 1 capsule (50,000 Units total) by mouth every 7 (seven) days.  Dispense: 12 capsule; Refill: 0  5. Obesity, Current BMI 30.6 Courtney [name] is currently in the action stage of change. As such, her goal is to continue with weight loss efforts. She has agreed to the Category 1 Plan.   Breakfast "real food" smoothie recipes were discussed.   Behavioral modification strategies: increasing lean protein intake.  Courtney [name] has agreed to follow-up with our clinic in 3 to 4 weeks. She was informed of the importance of frequent follow-up visits to maximize her success with intensive lifestyle modifications for her multiple health conditions.   Courtney [name] was informed we would discuss her lab results at her next visit unless there is a critical issue that needs to be addressed sooner. Courtney [name] agreed to keep her next visit at the agreed upon  time to discuss these results.  Objective:   Blood pressure 127/70, pulse (!) 54, temperature (!) 97.3 F (36.3 C), height 5' (1.524 m), weight 156 lb (70.8 kg), SpO2 96 %. Body mass index is 30.47 kg/m.  General: Cooperative, alert, well developed, in no acute distress. HEENT: Conjunctivae and lids unremarkable. Cardiovascular: Regular rhythm.  Lungs: Normal work of breathing. Neurologic: No focal deficits.   Lab Results  Component Value Date   CREATININE 0.88 07/11/2021   BUN 21 07/11/2021   NA 141 07/11/2021   K 4.8 07/11/2021   CL 103 07/11/2021   CO2 25 07/11/2021   Lab Results  Component Value Date   ALT 20 07/11/2021   AST 23 07/11/2021   ALKPHOS 70 07/11/2021   BILITOT 0.4 07/11/2021   Lab Results  Component Value Date   HGBA1C 5.7 (H) 07/11/2021   HGBA1C 6.0 (H) 03/20/2021   HGBA1C 5.8 (H) 08/30/2020   HGBA1C 5.8 (H) 03/30/2020   HGBA1C 5.6 12/01/2019   Lab Results  Component Value Date   INSULIN 9.0 07/11/2021   INSULIN 8.2 03/20/2021   INSULIN 6.8 08/30/2020   INSULIN 9.4 03/30/2020   INSULIN 7.2 12/01/2019   Lab Results  Component Value Date   TSH 1.610 07/11/2021   Lab Results  Component Value Date   CHOL 125 07/11/2021   HDL 76 07/11/2021   LDLCALC 32 07/11/2021   TRIG 92 07/11/2021   CHOLHDL 1.6 05/15/2021   Lab Results  Component Value Date   VD25OH 63.1 07/11/2021   VD25OH 79.4 03/20/2021   VD25OH 64.5 08/30/2020   Lab Results  Component Value Date   WBC 7.3 01/03/2020   HGB 13.0 01/03/2020   HCT 37.7 01/03/2020   MCV 91.5 01/03/2020   PLT 196.0 01/03/2020   Lab Results  Component Value Date   IRON 117 10/17/2020   TIBC 431.2 10/17/2020   FERRITIN 133.2 10/17/2020    Obesity Behavioral Intervention:   Approximately 15 minutes were spent on the discussion below.  ASK: We discussed the diagnosis of obesity with Courtney [name] today and Courtney [name] agreed to give us permission to discuss obesity behavioral modification therapy  today.  ASSESS: Courtney [name] has the diagnosis of obesity and her BMI today is 30.6. Courtney [name] is in the action stage of change.   ADVISE: Courtney [name] was educated on the multiple health risks of obesity as well as the benefit of weight loss to improve her health. She was advised of the need for long term treatment and the importance of lifestyle modifications to improve her current health and to decrease her risk of future health problems.  AGREE: Multiple dietary modification options and treatment options were discussed and Courtney [name] agreed to follow the recommendations documented in the above note.  ARRANGE: Courtney [name] was educated on the importance of frequent visits to treat obesity as outlined per CMS and USPSTF guidelines and agreed to schedule her next follow up appointment today.  Attestation Statements:   Reviewed by clinician on day of visit: allergies, medications, problem list, medical history, surgical history, family history, social history, and previous encounter notes.   I, Courtney [name], am acting as transcriptionist for Caren Beasley, MD.  I have reviewed the above documentation for accuracy and completeness, and I agree with the above. -  ***  

## 2021-07-24 ENCOUNTER — Encounter: Payer: Self-pay | Admitting: Pharmacist Clinician (PhC)/ Clinical Pharmacy Specialist

## 2021-07-25 ENCOUNTER — Ambulatory Visit
Admission: RE | Admit: 2021-07-25 | Discharge: 2021-07-25 | Disposition: A | Discharge status: Home / Self Care | Payer: Medicare HMO | Source: Ambulatory Visit | Attending: Family Medicine | Admitting: Family Medicine

## 2021-07-25 DIAGNOSIS — M5136 Other intervertebral disc degeneration, lumbar region: Secondary | ICD-10-CM

## 2021-07-25 DIAGNOSIS — M5126 Other intervertebral disc displacement, lumbar region: Secondary | ICD-10-CM | POA: Diagnosis not present

## 2021-07-25 MED ORDER — METHYLPREDNISOLONE ACETATE 40 MG/ML INJ SUSP (RADIOLOG
80.0000 mg | Freq: Once | INTRAMUSCULAR | Status: AC
  Administered 2021-07-25: 80 mg via EPIDURAL

## 2021-07-25 MED ORDER — IOPAMIDOL (ISOVUE-M 200) INJECTION 41%
1.0000 mL | Freq: Once | INTRAMUSCULAR | Status: AC
  Administered 2021-07-25: 1 mL via EPIDURAL

## 2021-07-25 NOTE — Discharge Instructions (Signed)

## 2021-08-02 ENCOUNTER — Encounter (INDEPENDENT_AMBULATORY_CARE_PROVIDER_SITE_OTHER): Payer: Self-pay | Admitting: Family Medicine

## 2021-08-02 ENCOUNTER — Ambulatory Visit (INDEPENDENT_AMBULATORY_CARE_PROVIDER_SITE_OTHER): Payer: Medicare HMO | Admitting: Family Medicine

## 2021-08-02 VITALS — BP 129/61 | HR 48 | Temp 97.4°F | Ht 60.0 in | Wt 153.0 lb

## 2021-08-02 DIAGNOSIS — E559 Vitamin D deficiency, unspecified: Secondary | ICD-10-CM

## 2021-08-02 DIAGNOSIS — I10 Essential (primary) hypertension: Secondary | ICD-10-CM

## 2021-08-02 DIAGNOSIS — Z6829 Body mass index (BMI) 29.0-29.9, adult: Secondary | ICD-10-CM

## 2021-08-02 DIAGNOSIS — E669 Obesity, unspecified: Secondary | ICD-10-CM | POA: Diagnosis not present

## 2021-08-06 NOTE — Progress Notes (Signed)
Chief Complaint:   OBESITY Courtney [name] is here to discuss her progress with her obesity treatment plan along with follow-up of her obesity related diagnoses. Courtney [name] is on the Category 1 Plan and states she is following her eating plan approximately 85% of the time. Courtney [name] states she is walking for 40 minutes 3 times per week.  Today's visit was #: 43 Starting weight: 157 lbs Starting date: 06/16/2017 Today's weight: 153 lbs Today's date: 08/02/2021 Total lbs lost to date: 4 Total lbs lost since last in-office visit: 3  Interim History: Courtney [name] continues to do well with weight loss.  She is exercising more often and trying to eat more vegetables, and limit social eating.  She has some traveling coming up, which may be extra challenging.  Subjective:   1. Essential hypertension Courtney [name]'s blood pressure is stable on her medications, and she has no signs of hypotension.  She continues to work on her diet and weight loss.  2. Vitamin D deficiency Courtney [name]'s vitamin D level is at goal, and she has no signs of over replacement.  Assessment/Plan:   1. Essential hypertension Courtney [name] will continue with her diet and exercise, and will watch for signs of hypotension with continued weight loss.  2. Vitamin D deficiency Courtney [name] will continue vitamin D as it is, no refill needed.  We will continue to manage her medications.  3. Obesity, Current BMI 29.9 Courtney [name] is currently in the action stage of change. As such, her goal is to continue with weight loss efforts. She has agreed to the Category 1 Plan and keeping a food journal and adhering to recommended goals of 300-400 calories and 25+ grams of protein at lunch daily.   Exercise goals: As is.  Behavioral modification strategies: increasing lean protein intake, meal planning and cooking strategies, and travel eating strategies.  Courtney [name] has agreed to follow-up with our clinic in 3 to 4 weeks. She was informed of the importance of frequent follow-up visits to  maximize her success with intensive lifestyle modifications for her multiple health conditions.   Objective:   Blood pressure 129/61, pulse (!) 48, temperature (!) 97.4 F (36.3 C), height 5' (1.524 m), weight 153 lb (69.4 kg), SpO2 97 %. Body mass index is 29.88 kg/m.  General: Cooperative, alert, well developed, in no acute distress. HEENT: Conjunctivae and lids unremarkable. Cardiovascular: Regular rhythm.  Lungs: Normal work of breathing. Neurologic: No focal deficits.   Lab Results  Component Value Date   CREATININE 0.88 07/11/2021   BUN 21 07/11/2021   NA 141 07/11/2021   K 4.8 07/11/2021   CL 103 07/11/2021   CO2 25 07/11/2021   Lab Results  Component Value Date   ALT 20 07/11/2021   AST 23 07/11/2021   ALKPHOS 70 07/11/2021   BILITOT 0.4 07/11/2021   Lab Results  Component Value Date   HGBA1C 5.7 (H) 07/11/2021   HGBA1C 6.0 (H) 03/20/2021   HGBA1C 5.8 (H) 08/30/2020   HGBA1C 5.8 (H) 03/30/2020   HGBA1C 5.6 12/01/2019   Lab Results  Component Value Date   INSULIN 9.0 07/11/2021   INSULIN 8.2 03/20/2021   INSULIN 6.8 08/30/2020   INSULIN 9.4 03/30/2020   INSULIN 7.2 12/01/2019   Lab Results  Component Value Date   TSH 1.610 07/11/2021   Lab Results  Component Value Date   CHOL 125 07/11/2021   HDL 76 07/11/2021   LDLCALC 32 07/11/2021   TRIG 92 07/11/2021   CHOLHDL 1.6 05/15/2021   Lab  Results  Component Value Date   VD25OH 63.1 07/11/2021   VD25OH 79.4 03/20/2021   VD25OH 64.5 08/30/2020   Lab Results  Component Value Date   WBC 7.3 01/03/2020   HGB 13.0 01/03/2020   HCT 37.7 01/03/2020   MCV 91.5 01/03/2020   PLT 196.0 01/03/2020   Lab Results  Component Value Date   IRON 117 10/17/2020   TIBC 431.2 10/17/2020   FERRITIN 133.2 10/17/2020    Obesity Behavioral Intervention:   Approximately 15 minutes were spent on the discussion below.  ASK: We discussed the diagnosis of obesity with Courtney [name] today and Courtney [name] agreed to give [place]  permission to discuss obesity behavioral modification therapy today.  ASSESS: Courtney [name] has the diagnosis of obesity and her BMI today is 29.9. Courtney [name] is in the action stage of change.   ADVISE: Courtney [name] was educated on the multiple health risks of obesity as well as the benefit of weight loss to improve her health. She was advised of the need for long term treatment and the importance of lifestyle modifications to improve her current health and to decrease her risk of future health problems.  AGREE: Multiple dietary modification options and treatment options were discussed and Courtney [name] agreed to follow the recommendations documented in the above note.  ARRANGE: Courtney [name] was educated on the importance of frequent visits to treat obesity as outlined per CMS and USPSTF guidelines and agreed to schedule her next follow up appointment today.  Attestation Statements:   Reviewed by clinician on day of visit: allergies, medications, problem list, medical history, surgical history, family history, social history, and previous encounter notes.   I, Trixie Dredge, am acting as transcriptionist for Dennard Nip, MD.  I have reviewed the above documentation for accuracy and completeness, and I agree with the above. -  Dennard Nip, MD

## 2021-08-14 DIAGNOSIS — H903 Sensorineural hearing loss, bilateral: Secondary | ICD-10-CM | POA: Diagnosis not present

## 2021-08-22 ENCOUNTER — Ambulatory Visit: Payer: Medicare HMO | Admitting: Sports Medicine

## 2021-08-22 ENCOUNTER — Other Ambulatory Visit: Payer: Self-pay | Admitting: Otolaryngology

## 2021-08-22 VITALS — BP 116/64 | HR 54 | Ht 60.0 in | Wt 162.0 lb

## 2021-08-22 DIAGNOSIS — M5136 Other intervertebral disc degeneration, lumbar region: Secondary | ICD-10-CM | POA: Diagnosis not present

## 2021-08-22 DIAGNOSIS — M5416 Radiculopathy, lumbar region: Secondary | ICD-10-CM

## 2021-08-22 DIAGNOSIS — H918X9 Other specified hearing loss, unspecified ear: Secondary | ICD-10-CM

## 2021-08-22 DIAGNOSIS — M79604 Pain in right leg: Secondary | ICD-10-CM | POA: Diagnosis not present

## 2021-08-22 MED ORDER — MELOXICAM 15 MG PO TABS
15.0000 mg | ORAL_TABLET | Freq: Every day | ORAL | 0 refills | Status: DC
Start: 2021-08-22 — End: 2021-11-19

## 2021-08-22 NOTE — Patient Instructions (Addendum)
Good to see you  - Start meloxicam 15 mg daily x2 weeks.  If still having pain after 2 weeks, complete 3rd-week of meloxicam. May use remaining meloxicam as needed once daily for pain control.  Do not to use additional NSAIDs while taking meloxicam.  May use Tylenol [phone] mg 2 to 3 times a day for breakthrough pain. Referral to [facility] pt Lumbar epidural right l4-l5 3 week follow up

## 2021-08-22 NOTE — Progress Notes (Signed)
Courtney [name] Courtney [name] Phone: [phone]   Assessment and Plan:     1. Lumbar radiculopathy 2. Right leg pain 3. DDD (degenerative disc disease), lumbar -Chronic with exacerbation, subsequent sports medicine visit - Moderate worsening in right leg pain radiating from back to knee most consistent with patient's lumbar pathology based on physical exam, though patient does have a history of a right hip replacement -- Start meloxicam 15 mg daily x2 weeks.  If still having pain after 2 weeks, complete 3rd-week of meloxicam. May use remaining meloxicam as needed once daily for pain control.  Do not to use additional NSAIDs while taking meloxicam.  May use Tylenol [phone] mg 2 to 3 times a day for breakthrough pain. - Based on physical exam and patient's lumbar pathology seen on MRI in 05/2020, history of improvement with epidural injections, we will repeat lumbar epidural injection right-sided L4-5.  Last epidural injection was on 07/25/2021 - Start physical therapy for low back - Ambulatory referral to Physical Therapy    Pertinent previous records reviewed include lumbar MRI 05/2020   Follow Up: 3 weeks for reevaluation.  If no improvement or worsening of symptoms, could consider hip x-ray to ensure total hip replacement is unremarkable, and could repeat lumbar MRI as patient has had change in symptoms with >1-year since previous examination   Subjective:   I, Courtney [name], am serving as a Education administrator for Dr. Glennon Mac.  Chief Complaint: right leg pain  HPI:   08/22/21 Hap epi done on 07/25/2021. Radiating pain, numbness, and tingling down right leg since she has had epidural. Pain radiates from hip to just below the knee. The last week it has gotten progressively worse. Using tens unit to help with immediate relief, but doesn't last.  Relevant Historical Information: hip replacement on right side  Additional  pertinent review of systems negative.   Current Outpatient Medications:    acetaminophen (TYLENOL) 500 MG tablet, Take 1,000 mg by mouth every 6 (six) hours as needed (Pain)., Disp: , Rfl:    aspirin 81 MG tablet, Take 81 mg by mouth daily., Disp: , Rfl:    clobetasol cream (TEMOVATE) 0.05 %, Apply 1-2 times a day to poison oak for 2 weeks, Disp: 60 g, Rfl: 1   dicyclomine (BENTYL) 20 MG tablet, Take 1 tablet (20 mg total) by mouth every 6 (six) hours as needed for spasms., Disp: 180 tablet, Rfl: 0   Evolocumab (REPATHA SURECLICK) 858 MG/ML SOAJ, Inject 140 mg into the skin every 14 (fourteen) days., Disp: 2 mL, Rfl: 11   ezetimibe (ZETIA) 10 MG tablet, Take 1 tablet (10 mg total) by mouth daily., Disp: 90 tablet, Rfl: 3   gabapentin (NEURONTIN) 300 MG capsule, Take 1 capsule (300 mg total) by mouth at bedtime., Disp: 90 capsule, Rfl: 3   Melatonin 10 MG TABS, Take by mouth., Disp: , Rfl:    meloxicam (MOBIC) 15 MG tablet, Take 1 tablet (15 mg total) by mouth daily., Disp: 90 tablet, Rfl: 0   methylPREDNISolone (MEDROL DOSEPAK) 4 MG TBPK tablet, Sig as indicated, Disp: 21 tablet, Rfl: 1   metoprolol tartrate (LOPRESSOR) 25 MG tablet, Take 0.5 tablets (12.5 mg total) by mouth 2 (two) times daily., Disp: 90 tablet, Rfl: 3   nitroGLYCERIN (NITROSTAT) 0.4 MG SL tablet, Place 1 tablet (0.4 mg total) under the tongue every 5 (five) minutes as needed for chest pain., Disp: 25 tablet, Rfl: 11  pantoprazole (PROTONIX) 40 MG tablet, Take 1 tablet (40 mg total) by mouth daily., Disp: 90 tablet, Rfl: 3   ramipril (ALTACE) 10 MG capsule, Take 1 capsule (10 mg total) by mouth daily., Disp: 90 capsule, Rfl: 3   rosuvastatin (CRESTOR) 40 MG tablet, Take 1 tablet (40 mg total) by mouth daily., Disp: 90 tablet, Rfl: 3   Vitamin D, Ergocalciferol, (DRISDOL) 1.25 MG (50000 UNIT) CAPS capsule, Take 1 capsule (50,000 Units total) by mouth every 7 (seven) days., Disp: 12 capsule, Rfl: 0   Objective:     Vitals:    08/22/21 0840  BP: 116/64  Pulse: (!) 54  SpO2: 98%  Weight: 162 lb (73.5 kg)  Height: 5' (1.524 m)      Body mass index is 31.64 kg/m.    Physical Exam:    Gen: Appears well, nad, nontoxic and pleasant Psych: Alert and oriented, appropriate mood and affect Neuro: sensation intact, strength is 5/5 in upper and lower extremities, muscle tone wnl Skin: no susupicious lesions or rashes  Back - Normal skin, Spine with normal alignment and no deformity.   No tenderness to vertebral process palpation.   Paraspinous muscles are mildly tender, worse on right, and without spasm Straight leg raise positive Trendelenberg positive    Right hip: No deformity, swelling or wasting ROM Flexion 75, ext 15, IR 30, ER 40 TTP gluteal musculature NTTP over the hip flexors, greater troch,   Negative log roll with FROM Negative FABER Negative FADIR     Electronically signed by:  Courtney Mccreedy D.[facility] 9:15 AM 08/22/21

## 2021-08-29 ENCOUNTER — Ambulatory Visit
Admission: RE | Admit: 2021-08-29 | Discharge: 2021-08-29 | Disposition: A | Discharge status: Home / Self Care | Payer: Medicare HMO | Source: Ambulatory Visit | Attending: Sports Medicine | Admitting: Sports Medicine

## 2021-08-29 DIAGNOSIS — M5416 Radiculopathy, lumbar region: Secondary | ICD-10-CM | POA: Diagnosis not present

## 2021-08-29 DIAGNOSIS — M79604 Pain in right leg: Secondary | ICD-10-CM

## 2021-08-29 DIAGNOSIS — M5136 Other intervertebral disc degeneration, lumbar region: Secondary | ICD-10-CM

## 2021-08-29 MED ORDER — IOPAMIDOL (ISOVUE-M 200) INJECTION 41%
1.0000 mL | Freq: Once | INTRAMUSCULAR | Status: AC
  Administered 2021-08-29: 1 mL via EPIDURAL

## 2021-08-29 MED ORDER — METHYLPREDNISOLONE ACETATE 40 MG/ML INJ SUSP (RADIOLOG
80.0000 mg | Freq: Once | INTRAMUSCULAR | Status: AC
  Administered 2021-08-29: 80 mg via EPIDURAL

## 2021-08-29 NOTE — Discharge Instructions (Signed)

## 2021-08-30 ENCOUNTER — Ambulatory Visit (INDEPENDENT_AMBULATORY_CARE_PROVIDER_SITE_OTHER): Payer: Medicare HMO | Admitting: Family Medicine

## 2021-08-30 ENCOUNTER — Encounter (INDEPENDENT_AMBULATORY_CARE_PROVIDER_SITE_OTHER): Payer: Self-pay | Admitting: Family Medicine

## 2021-08-30 VITALS — BP 120/71 | HR 86 | Temp 97.6°F | Ht 60.0 in | Wt 157.0 lb

## 2021-08-30 DIAGNOSIS — E559 Vitamin D deficiency, unspecified: Secondary | ICD-10-CM | POA: Diagnosis not present

## 2021-08-30 DIAGNOSIS — E669 Obesity, unspecified: Secondary | ICD-10-CM | POA: Diagnosis not present

## 2021-08-30 DIAGNOSIS — Z6830 Body mass index (BMI) 30.0-30.9, adult: Secondary | ICD-10-CM

## 2021-08-30 DIAGNOSIS — F439 Reaction to severe stress, unspecified: Secondary | ICD-10-CM | POA: Insufficient documentation

## 2021-08-30 MED ORDER — VITAMIN D (ERGOCALCIFEROL) 1.25 MG (50000 UNIT) PO CAPS: 50000.0000 [IU] | ORAL_CAPSULE | ORAL | 0 refills | Status: DC

## 2021-09-03 NOTE — Progress Notes (Signed)
Chief Complaint:   OBESITY Courtney [name] is here to discuss her progress with her obesity treatment plan along with follow-up of her obesity related diagnoses. Courtney [name] is on the Category 1 Plan and keeping a food journal and adhering to recommended goals of 300-400 calories and 25+ grams of protein at lunch daily and states she is following her eating plan approximately 90% of the time. Courtney [name] states she is walking for 40 minutes 2-3 times per week.  Today's visit was #: 75 Starting weight: 157 lbs Starting date: 06/16/2017 Today's weight: 157 lbs Today's date: 08/30/2021 Total lbs lost to date: 0 Total lbs lost since last in-office visit: 0  Interim History: Courtney [name] is retaining some fluid today.  She has had 2 steroid epidurals and she has 1 more scheduled next month.  She is working on meeting her protein goals and decreasing sodium.  Subjective:   1. Vitamin D deficiency Courtney [name] is stable on vitamin D, and her last vitamin D level was at goal.  2. Stress Courtney [name] is seeing a counselor to help her deal with her family stressors.  She is working on setting boundaries with her daughter and recognizing that she is not responsible for her daughter's decisions.  Assessment/Plan:   1. Vitamin D deficiency We will refill prescription Vitamin D for 1 month. Rital will follow-up for routine testing of Vitamin D, at least 2-3 times per year to avoid over-replacement.  - Vitamin D, Ergocalciferol, (DRISDOL) 1.25 MG (50000 UNIT) CAPS capsule; Take 1 capsule (50,000 Units total) by mouth every 7 (seven) days.  Dispense: 4 capsule; Refill: 0  2. Stress Courtney [name] was offered support and encouragement, and we will continue to monitor.  3. Obesity, Current BMI 30.8 Courtney [name] is currently in the action stage of change. As such, her goal is to continue with weight loss efforts. She has agreed to the Category 1 Plan and keeping a food journal and adhering to recommended goals of 300-400 calories and 35+ grams of  protein at lunch daily.   Exercise goals: As is.   Behavioral modification strategies: increasing lean protein intake, increasing water intake, and meal planning and cooking strategies.  Courtney [name] has agreed to follow-up with our clinic in 4 weeks. She was informed of the importance of frequent follow-up visits to maximize her success with intensive lifestyle modifications for her multiple health conditions.   Objective:   Blood pressure 120/71, pulse 86, temperature 97.6 F (36.4 C), height 5' (1.524 m), weight 157 lb (71.2 kg). Body mass index is 30.66 kg/m.  General: Cooperative, alert, well developed, in no acute distress. HEENT: Conjunctivae and lids unremarkable. Cardiovascular: Regular rhythm.  Lungs: Normal work of breathing. Neurologic: No focal deficits.   Lab Results  Component Value Date   CREATININE 0.88 07/11/2021   BUN 21 07/11/2021   NA 141 07/11/2021   K 4.8 07/11/2021   CL 103 07/11/2021   CO2 25 07/11/2021   Lab Results  Component Value Date   ALT 20 07/11/2021   AST 23 07/11/2021   ALKPHOS 70 07/11/2021   BILITOT 0.4 07/11/2021   Lab Results  Component Value Date   HGBA1C 5.7 (H) 07/11/2021   HGBA1C 6.0 (H) 03/20/2021   HGBA1C 5.8 (H) 08/30/2020   HGBA1C 5.8 (H) 03/30/2020   HGBA1C 5.6 12/01/2019   Lab Results  Component Value Date   INSULIN 9.0 07/11/2021   INSULIN 8.2 03/20/2021   INSULIN 6.8 08/30/2020   INSULIN 9.4 03/30/2020   INSULIN 7.2 12/01/2019  Lab Results  Component Value Date   TSH 1.610 07/11/2021   Lab Results  Component Value Date   CHOL 125 07/11/2021   HDL 76 07/11/2021   LDLCALC 32 07/11/2021   TRIG 92 07/11/2021   CHOLHDL 1.6 05/15/2021   Lab Results  Component Value Date   VD25OH 63.1 07/11/2021   VD25OH 79.4 03/20/2021   VD25OH 64.5 08/30/2020   Lab Results  Component Value Date   WBC 7.3 01/03/2020   HGB 13.0 01/03/2020   HCT 37.7 01/03/2020   MCV 91.5 01/03/2020   PLT 196.0 01/03/2020   Lab Results   Component Value Date   IRON 117 10/17/2020   TIBC 431.2 10/17/2020   FERRITIN 133.2 10/17/2020   Attestation Statements:   Reviewed by clinician on day of visit: allergies, medications, problem list, medical history, surgical history, family history, social history, and previous encounter notes.   I, Trixie Dredge, am acting as transcriptionist for Dennard Nip, MD.  I have reviewed the above documentation for accuracy and completeness, and I agree with the above. -  Dennard Nip, MD

## 2021-09-05 ENCOUNTER — Ambulatory Visit
Admission: RE | Admit: 2021-09-05 | Discharge: 2021-09-05 | Disposition: A | Discharge status: Home / Self Care | Payer: Medicare HMO | Source: Ambulatory Visit | Attending: Otolaryngology | Admitting: Otolaryngology

## 2021-09-05 DIAGNOSIS — H918X9 Other specified hearing loss, unspecified ear: Secondary | ICD-10-CM

## 2021-09-05 MED ORDER — GADOBENATE DIMEGLUMINE 529 MG/ML IV SOLN
14.0000 mL | Freq: Once | INTRAVENOUS | Status: AC | PRN
Start: 2021-09-05 — End: 2021-09-05
  Administered 2021-09-05: 14 mL via INTRAVENOUS

## 2021-09-07 ENCOUNTER — Encounter: Payer: Self-pay | Admitting: Pharmacist Clinician (PhC)/ Clinical Pharmacy Specialist

## 2021-09-11 NOTE — Progress Notes (Unsigned)
    Courtney [name] D.Quesada Weld Phone: [phone]   Assessment and Plan:     There are no diagnoses linked to this encounter.  ***   Pertinent previous records reviewed include ***   Follow Up: ***     Subjective:    I, Courtney [name], am serving as a Education administrator for Doctor Glennon Mac  Chief Complaint: right leg pain   HPI:    08/22/21 Hap epi done on 07/25/2021. Radiating pain, numbness, and tingling down right leg since she has had epidural. Pain radiates from hip to just below the knee. The last week it has gotten progressively worse. Using tens unit to help with immediate relief, but doesn't last.  09/12/2021 Patient states    Relevant Historical Information: hip replacement on right side    Additional pertinent review of systems negative.   Current Outpatient Medications:    acetaminophen (TYLENOL) 500 MG tablet, Take 1,000 mg by mouth every 6 (six) hours as needed (Pain)., Disp: , Rfl:    aspirin 81 MG tablet, Take 81 mg by mouth daily., Disp: , Rfl:    clobetasol cream (TEMOVATE) 0.05 %, Apply 1-2 times a day to poison oak for 2 weeks, Disp: 60 g, Rfl: 1   dicyclomine (BENTYL) 20 MG tablet, Take 1 tablet (20 mg total) by mouth every 6 (six) hours as needed for spasms., Disp: 180 tablet, Rfl: 0   Evolocumab (REPATHA SURECLICK) 628 MG/ML SOAJ, Inject 140 mg into the skin every 14 (fourteen) days., Disp: 2 mL, Rfl: 11   ezetimibe (ZETIA) 10 MG tablet, Take 1 tablet (10 mg total) by mouth daily., Disp: 90 tablet, Rfl: 3   gabapentin (NEURONTIN) 300 MG capsule, Take 1 capsule (300 mg total) by mouth at bedtime., Disp: 90 capsule, Rfl: 3   meloxicam (MOBIC) 15 MG tablet, Take 1 tablet (15 mg total) by mouth daily., Disp: 90 tablet, Rfl: 0   metoprolol tartrate (LOPRESSOR) 25 MG tablet, Take 0.5 tablets (12.5 mg total) by mouth 2 (two) times daily., Disp: 90 tablet, Rfl: 3   nitroGLYCERIN (NITROSTAT)  0.4 MG SL tablet, Place 1 tablet (0.4 mg total) under the tongue every 5 (five) minutes as needed for chest pain., Disp: 25 tablet, Rfl: 11   pantoprazole (PROTONIX) 40 MG tablet, Take 1 tablet (40 mg total) by mouth daily., Disp: 90 tablet, Rfl: 3   ramipril (ALTACE) 10 MG capsule, Take 1 capsule (10 mg total) by mouth daily., Disp: 90 capsule, Rfl: 3   rosuvastatin (CRESTOR) 40 MG tablet, Take 1 tablet (40 mg total) by mouth daily., Disp: 90 tablet, Rfl: 3   Vitamin D, Ergocalciferol, (DRISDOL) 1.25 MG (50000 UNIT) CAPS capsule, Take 1 capsule (50,000 Units total) by mouth every 7 (seven) days., Disp: 4 capsule, Rfl: 0   Objective:     There were no vitals filed for this visit.    There is no height or weight on file to calculate BMI.    Physical Exam:    ***   Electronically signed by:  Courtney [name] D.[facility] 7:48 AM 09/11/21

## 2021-09-12 ENCOUNTER — Ambulatory Visit: Payer: Medicare HMO | Admitting: Sports Medicine

## 2021-09-12 VITALS — BP 124/90 | HR 45 | Ht 60.0 in | Wt 161.0 lb

## 2021-09-12 DIAGNOSIS — M79604 Pain in right leg: Secondary | ICD-10-CM | POA: Diagnosis not present

## 2021-09-12 DIAGNOSIS — M5136 Other intervertebral disc degeneration, lumbar region: Secondary | ICD-10-CM | POA: Diagnosis not present

## 2021-09-12 DIAGNOSIS — M5416 Radiculopathy, lumbar region: Secondary | ICD-10-CM

## 2021-09-12 NOTE — Patient Instructions (Addendum)
Good to see you  Use meloxicam as needed for breakthrough pain limit 1-2 times a week  Tylenol [phone] mg 2-3 times a day for pain relief  Neurosurgery referral  As needed follow up

## 2021-09-13 ENCOUNTER — Encounter: Payer: Self-pay | Admitting: Family Medicine

## 2021-09-13 ENCOUNTER — Other Ambulatory Visit: Payer: Self-pay

## 2021-09-13 DIAGNOSIS — M25561 Pain in right knee: Secondary | ICD-10-CM

## 2021-09-14 ENCOUNTER — Ambulatory Visit (INDEPENDENT_AMBULATORY_CARE_PROVIDER_SITE_OTHER): Payer: Medicare HMO

## 2021-09-14 ENCOUNTER — Ambulatory Visit: Payer: Medicare HMO

## 2021-09-14 ENCOUNTER — Ambulatory Visit (INDEPENDENT_AMBULATORY_CARE_PROVIDER_SITE_OTHER): Payer: Medicare HMO | Admitting: Family Medicine

## 2021-09-14 VITALS — BP 120/82 | HR 56 | Ht 60.0 in | Wt 161.0 lb

## 2021-09-14 DIAGNOSIS — M5136 Other intervertebral disc degeneration, lumbar region: Secondary | ICD-10-CM | POA: Diagnosis not present

## 2021-09-14 DIAGNOSIS — M25561 Pain in right knee: Secondary | ICD-10-CM

## 2021-09-14 DIAGNOSIS — M25551 Pain in right hip: Secondary | ICD-10-CM

## 2021-09-14 DIAGNOSIS — M1711 Unilateral primary osteoarthritis, right knee: Secondary | ICD-10-CM

## 2021-09-14 NOTE — Patient Instructions (Addendum)
Injected R knee today Xray for R hip on your way out Send message in 5-7 days If not better will consider MRI If worse over weekend please seek medication attention Keep neurosurgery appt See me in 4-6 weeks

## 2021-09-14 NOTE — Progress Notes (Signed)
[place] Phone: [phone] Subjective:   Fontaine No, am serving as a scribe for Dr. Hulan Saas.  I'm seeing this patient by the request  of:  [name], Ines Bloomer, MD  CC: Right knee pain  [id number]  LORILEE CAFARELLA is a 71 y.o. female coming in with complaint of R knee pain. Pain seemed to become more prominent after getting epidural. Antalgic gait. Patient is using meloxicam but this is not helping her knee.   Pain in lumbar spine has improved since epidural. Less pain in R quad. Dr. Glennon Mac recommended that she speak with neurosurgery but she wants to know if this is the next best course of action.   Patient also wants to know if her R hip is ok as she is having intermittent groin pain. History of THR.     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 BMS-mid RCA 11/2007 b. stress echo 02/2009 subtle inferior HK, lateral ST depressions with stress c. follow-up cath 02/2009: RCA stent patent, severe but stable stenosis of distal posterior lateral branch RCA. LV normal, med tx unless more sx c. ETT Myoview 08/27/12: negative for ischemia; EF 64%   Decreased hearing    Depression    Mild, August, 2012   Dyslipidemia    Easy bruising    Ejection fraction    EF normal, catheterization, 2011 /  EF 55%, echo, 2010   Fluid overload    Mild, stable since hospitalization in the past   GERD (gastroesophageal reflux disease)    occarionally, takes Pepcid over the counter   Heart murmur    has, occasional PVC's   History of right hip replacement    Hyperlipidemia    Hypertension    Joint pain    Myocardial  infarction ([place])    2009   Palpitations    PVC (premature ventricular contraction)    per prior Holter monitor   Right hip pain 12/2009   Shellfish allergy    Stress fracture    Left foot   Trouble in sleeping    Vitamin B12 deficiency    Vitamin D deficiency    Past Surgical History:  Procedure Laterality Date   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/01/2005   BREAST BIOPSY Left    carcinoid tumor removal     colon-   CARDIAC CATHETERIZATION  1/211   Patent RCA stent, stenotic PLB supplying small distribution; medically managed   CATARACT EXTRACTION, BILATERAL     CORONARY ANGIOPLASTY WITH STENT PLACEMENT  11/2007   BMS-mid RCA   DIAGNOSTIC LAPAROSCOPY     1982 for endometriosis   DILATION AND CURETTAGE OF UTERUS  2005   SKIN CANCER EXCISION     TOTAL HIP ARTHROPLASTY Right 07/08/2014   Procedure: RIGHT TOTAL HIP ARTHROPLASTY ANTERIOR APPROACH;  Surgeon: Mcarthur Rossetti, MD;  Location: [facility];  Service: Orthopedics;  Laterality: Right;   WISDOM TOOTH EXTRACTION     Social History   Socioeconomic History   Marital status: Divorced    Spouse name: Not  on file   Number of children: 1   Years of education: 28   Highest education level: Not on file  Occupational History   Occupation: Retired - Retail buyer  Tobacco Use   Smoking status: Never   Smokeless tobacco: Never  Vaping Use   Vaping Use: Never used  Substance and Sexual Activity   Alcohol use: Yes    Comment: 1 glass of wine 3x/week   Drug use: No   Sexual activity: Yes  Other Topics Concern   Not on file  Social History Narrative   Right handed   Soda- sometimes   Drinks herbal tea   Fun: Dance, garden,    Denies religious beliefs effecting health care.   Denies abuse and feels safe at home   Social Determinants of Health   Financial Resource Strain: Low Risk  (03/22/2020)   Overall Financial Resource Strain (CARDIA)    Difficulty of Paying Living Expenses: Not hard at all  Food Insecurity: No Food  Insecurity (03/22/2020)   Hunger Vital Sign    Worried About Running Out of Food in the Last Year: Never true    Ran Out of Food in the Last Year: Never true  Transportation Needs: No Transportation Needs (03/22/2020)   PRAPARE - Hydrologist (Medical): No    Lack of Transportation (Non-Medical): No  Physical Activity: Sufficiently Active (03/22/2020)   Exercise Vital Sign    Days of Exercise per Week: 5 days    Minutes of Exercise per Session: 30 min  Stress: No Stress Concern Present (03/22/2020)   [place]    Feeling of Stress : Not at all  Social Connections: Moderately Isolated (03/22/2020)   Social Connection and Isolation Panel [NHANES]    Frequency of Communication with Friends and Family: More than three times a week    Frequency of Social Gatherings with Friends and Family: More than three times a week    Attends Religious Services: Never    Marine scientist or Organizations: No    Attends Music therapist: Never    Marital Status: Living with partner   Allergies  Allergen Reactions   Erythromycin Hives   Penicillins Hives    Has patient had a PCN reaction causing immediate rash, facial/tongue/throat swelling, SOB or lightheadedness with hypotension: No Has patient had a PCN reaction causing severe rash involving mucus membranes or skin necrosis: No Has patient had a PCN reaction that required hospitalization: No Has patient had a PCN reaction occurring within the last 10 years: No If all of the above answers are "NO", then may proceed with Cephalosporin use.    Shellfish Allergy Hives   Family History  Problem Relation Age of Onset   Heart attack Father 18   Hyperlipidemia Father    Hypertension Father    Sudden death Father    Stroke Mother    Hyperlipidemia Mother    Hypertension Mother    Heart disease Mother    Obesity Mother    Heart attack Paternal  Grandfather    Heart disease Sister    BRCA 1/2 Neg Hx    Breast cancer Neg Hx    Esophageal cancer Neg Hx    Colon cancer Neg Hx    Pancreatic cancer Neg Hx    Liver disease Neg Hx    Stomach cancer Neg Hx      Current Outpatient Medications (Cardiovascular):  Evolocumab (REPATHA SURECLICK) 867 MG/ML SOAJ, Inject 140 mg into the skin every 14 (fourteen) days.   ezetimibe (ZETIA) 10 MG tablet, Take 1 tablet (10 mg total) by mouth daily.   metoprolol tartrate (LOPRESSOR) 25 MG tablet, Take 0.5 tablets (12.5 mg total) by mouth 2 (two) times daily.   nitroGLYCERIN (NITROSTAT) 0.4 MG SL tablet, Place 1 tablet (0.4 mg total) under the tongue every 5 (five) minutes as needed for chest pain.   ramipril (ALTACE) 10 MG capsule, Take 1 capsule (10 mg total) by mouth daily.   rosuvastatin (CRESTOR) 40 MG tablet, Take 1 tablet (40 mg total) by mouth daily.   Current Outpatient Medications (Analgesics):    acetaminophen (TYLENOL) 500 MG tablet, Take 1,000 mg by mouth every 6 (six) hours as needed (Pain).   aspirin 81 MG tablet, Take 81 mg by mouth daily.   meloxicam (MOBIC) 15 MG tablet, Take 1 tablet (15 mg total) by mouth daily.   Current Outpatient Medications (Other):    clobetasol cream (TEMOVATE) 0.05 %, Apply 1-2 times a day to poison oak for 2 weeks   dicyclomine (BENTYL) 20 MG tablet, Take 1 tablet (20 mg total) by mouth every 6 (six) hours as needed for spasms.   gabapentin (NEURONTIN) 300 MG capsule, Take 1 capsule (300 mg total) by mouth at bedtime.   pantoprazole (PROTONIX) 40 MG tablet, Take 1 tablet (40 mg total) by mouth daily.   Vitamin D, Ergocalciferol, (DRISDOL) 1.25 MG (50000 UNIT) CAPS capsule, Take 1 capsule (50,000 Units total) by mouth every 7 (seven) days.    Review of Systems:  No headache, visual changes, nausea, vomiting, diarrhea, constipation, dizziness, abdominal pain, skin rash, fevers, chills, night sweats, weight loss, swollen lymph nodes, body aches,  joint swelling, chest pain, shortness of breath, mood changes. POSITIVE muscle aches  Objective  Blood pressure 120/82, pulse (!) 56, height 5' (1.524 m), weight 161 lb (73 kg), SpO2 97 %.   General: No apparent distress alert and oriented x3 mood and affect normal, dressed appropriately.  HEENT: Pupils equal, extraocular movements intact  Respiratory: Patient's speak in full sentences and does not appear short of breath  Cardiovascular: No lower extremity edema, non tender, no erythema  Antalgic gait  Patient has significant antalgic gait, does have loss of lordosis of the lumbar spine. Right knee exam does have some crepitus noted.  Mild lateral tracking noted.  Patient does have some mild atrophy compared to the contralateral side.  Pain significantly out of proportion to the amount of palpation.  Limited muscular skeletal ultrasound was performed and interpreted by Hulan Saas, M  Limited ultrasound of patient's knee does not show any significant arthritic changes noted.  Patient has very small trace effusion noted of the patellofemoral joint. Impression: Mild arthritis  After informed written and verbal consent, patient was seated on exam table. Right knee was prepped with alcohol swab and utilizing anterolateral approach, patient's right knee space was injected with 4:1  marcaine 0.5%: Kenalog $RemoveBefor'40mg'[id number]$ /dL. Patient tolerated the procedure well without immediate complications.   Impression and Recommendations:     The above documentation has been reviewed and is accurate and complete Lyndal Pulley, DO

## 2021-09-14 NOTE — Assessment & Plan Note (Signed)
Exacerbation of chronic problem.  Patient did have this 2 years ago.  Attempted injection today.  We will try a brace.  Home exercises.  Pain does seem to be out of proportion to the amount of palpation.  Will get a right hip x-ray as well to further evaluate to make sure there is no loosening of the replacement.  Differential does include a lumbar radiculopathy but recently did have an epidural which did help a lot of the thigh pain she was having but did not help the knee pain.  Patient will call us again after the weekend to tell us how she is doing.  If having worsening pain or unable to ambulate I do think we need to consider the possibility of an MRI.  We will follow-up again in 4 to 6 weeks

## 2021-09-18 ENCOUNTER — Encounter: Payer: Self-pay | Admitting: Family Medicine

## 2021-09-24 ENCOUNTER — Ambulatory Visit (INDEPENDENT_AMBULATORY_CARE_PROVIDER_SITE_OTHER): Payer: Medicare HMO

## 2021-09-24 DIAGNOSIS — Z0000 Encounter for general adult medical examination without abnormal findings: Secondary | ICD-10-CM | POA: Diagnosis not present

## 2021-09-24 NOTE — Patient Instructions (Signed)
Courtney [name] , Thank you for taking time to come for your Medicare Wellness Visit. I appreciate your ongoing commitment to your health goals. Please review the following plan we discussed and let me know if I can assist you in the future.   Screening recommendations/referrals: Colonoscopy: 02/22/2013 Mammogram: 05/23/2021 Bone Density: 12/02/2017 Recommended yearly ophthalmology/optometry visit for glaucoma screening and checkup Recommended yearly dental visit for hygiene and checkup  Vaccinations: Influenza vaccine: completed  Pneumococcal vaccine: completed  Tdap vaccine: 11/29/2015 Shingles vaccine: completed     Advanced directives: yes   Conditions/risks identified: none   Next appointment: none    Preventive Care 73 Years and Older, Female Preventive care refers to lifestyle choices and visits with your health care provider that can promote health and wellness. What does preventive care include? A yearly physical exam. This is also called an annual well check. Dental exams once or twice a year. Routine eye exams. Ask your health care provider how often you should have your eyes checked. Personal lifestyle choices, including: Daily care of your teeth and gums. Regular physical activity. Eating a healthy diet. Avoiding tobacco and drug use. Limiting alcohol use. Practicing safe sex. Taking low-dose aspirin every day. Taking vitamin and mineral supplements as recommended by your health care provider. What happens during an annual well check? The services and screenings done by your health care provider during your annual well check will depend on your age, overall health, lifestyle risk factors, and family history of disease. Counseling  Your health care provider may ask you questions about your: Alcohol use. Tobacco use. Drug use. Emotional well-being. Home and relationship well-being. Sexual activity. Eating habits. History of falls. Memory and ability to  understand (cognition). Work and work Statistician. Reproductive health. Screening  You may have the following tests or measurements: Height, weight, and BMI. Blood pressure. Lipid and cholesterol levels. These may be checked every 5 years, or more frequently if you are over 16 years old. Skin check. Lung cancer screening. You may have this screening every year starting at age 73 if you have a 30-pack-year history of smoking and currently smoke or have quit within the past 15 years. Fecal occult blood test (FOBT) of the stool. You may have this test every year starting at age 73. Flexible sigmoidoscopy or colonoscopy. You may have a sigmoidoscopy every 5 years or a colonoscopy every 10 years starting at age 73. Hepatitis C blood test. Hepatitis B blood test. Sexually transmitted disease (STD) testing. Diabetes screening. This is done by checking your blood sugar (glucose) after you have not eaten for a while (fasting). You may have this done every 1-3 years. Bone density scan. This is done to screen for osteoporosis. You may have this done starting at age 73. Mammogram. This may be done every 1-2 years. Talk to your health care provider about how often you should have regular mammograms. Talk with your health care provider about your test results, treatment options, and if necessary, the need for more tests. Vaccines  Your health care provider may recommend certain vaccines, such as: Influenza vaccine. This is recommended every year. Tetanus, diphtheria, and acellular pertussis (Tdap, Td) vaccine. You may need a Td booster every 10 years. Zoster vaccine. You may need this after age 21. Pneumococcal 13-valent conjugate (PCV13) vaccine. One dose is recommended after age 65. Pneumococcal polysaccharide (PPSV23) vaccine. One dose is recommended after age 73. Talk to your health care provider about which screenings and vaccines you need and how often you  need them. This information is not  intended to replace advice given to you by your health care provider. Make sure you discuss any questions you have with your health care provider. Document Released: 03/03/2015 Document Revised: 10/25/2015 Document Reviewed: 12/06/2014 Elsevier Interactive Patient Education  2017 [place] Prevention in the Home Falls can cause injuries. They can happen to people of all ages. There are many things you can do to make your home safe and to help prevent falls. What can I do on the outside of my home? Regularly fix the edges of walkways and driveways and fix any cracks. Remove anything that might make you trip as you walk through a door, such as a raised step or threshold. Trim any bushes or trees on the path to your home. Use bright outdoor lighting. Clear any walking paths of anything that might make someone trip, such as rocks or tools. Regularly check to see if handrails are loose or broken. Make sure that both sides of any steps have handrails. Any raised decks and porches should have guardrails on the edges. Have any leaves, snow, or ice cleared regularly. Use sand or salt on walking paths during winter. Clean up any spills in your garage right away. This includes oil or grease spills. What can I do in the bathroom? Use night lights. Install grab bars by the toilet and in the tub and shower. Do not use towel bars as grab bars. Use non-skid mats or decals in the tub or shower. If you need to sit down in the shower, use a plastic, non-slip stool. Keep the floor dry. Clean up any water that spills on the floor as soon as it happens. Remove soap buildup in the tub or shower regularly. Attach bath mats securely with double-sided non-slip rug tape. Do not have throw rugs and other things on the floor that can make you trip. What can I do in the bedroom? Use night lights. Make sure that you have a light by your bed that is easy to reach. Do not use any sheets or blankets that are  too big for your bed. They should not hang down onto the floor. Have a firm chair that has side arms. You can use this for support while you get dressed. Do not have throw rugs and other things on the floor that can make you trip. What can I do in the kitchen? Clean up any spills right away. Avoid walking on wet floors. Keep items that you use a lot in easy-to-reach places. If you need to reach something above you, use a strong step stool that has a grab bar. Keep electrical cords out of the way. Do not use floor polish or wax that makes floors slippery. If you must use wax, use non-skid floor wax. Do not have throw rugs and other things on the floor that can make you trip. What can I do with my stairs? Do not leave any items on the stairs. Make sure that there are handrails on both sides of the stairs and use them. Fix handrails that are broken or loose. Make sure that handrails are as long as the stairways. Check any carpeting to make sure that it is firmly attached to the stairs. Fix any carpet that is loose or worn. Avoid having throw rugs at the top or bottom of the stairs. If you do have throw rugs, attach them to the floor with carpet tape. Make sure that you have a light switch  at the top of the stairs and the bottom of the stairs. If you do not have them, ask someone to add them for you. What else can I do to help prevent falls? Wear shoes that: Do not have high heels. Have rubber bottoms. Are comfortable and fit you well. Are closed at the toe. Do not wear sandals. If you use a stepladder: Make sure that it is fully opened. Do not climb a closed stepladder. Make sure that both sides of the stepladder are locked into place. Ask someone to hold it for you, if possible. Clearly mark and make sure that you can see: Any grab bars or handrails. First and last steps. Where the edge of each step is. Use tools that help you move around (mobility aids) if they are needed. These  include: Canes. Walkers. Scooters. Crutches. Turn on the lights when you go into a dark area. Replace any light bulbs as soon as they burn out. Set up your furniture so you have a clear path. Avoid moving your furniture around. If any of your floors are uneven, fix them. If there are any pets around you, be aware of where they are. Review your medicines with your doctor. Some medicines can make you feel dizzy. This can increase your chance of falling. Ask your doctor what other things that you can do to help prevent falls. This information is not intended to replace advice given to you by your health care provider. Make sure you discuss any questions you have with your health care provider. Document Released: 12/01/2008 Document Revised: 07/13/2015 Document Reviewed: 03/11/2014 Elsevier Interactive Patient Education  2017 [employer].

## 2021-09-24 NOTE — Progress Notes (Signed)
Subjective:   Courtney [name] is a 71 y.o. female who presents for Medicare Annual (Subsequent) preventive examination.   I connected with Courtney [name]  today by telephone and verified that I am speaking with the correct person using two identifiers. Location patient: home Location provider: work Persons participating in the virtual visit: patient, provider.   I discussed the limitations, risks, security and privacy concerns of performing an evaluation and management service by telephone and the availability of in person appointments. I also discussed with the patient that there may be a patient responsible charge related to this service. The patient expressed understanding and verbally consented to this telephonic visit.    Interactive audio and video telecommunications were attempted between this provider and patient, however failed, due to patient having technical difficulties OR patient did not have access to video capability.  We continued and completed visit with audio only.    Review of Systems         Objective:    Today's Vitals   There is no height or weight on file to calculate BMI.     09/24/2021    8:22 AM 09/18/2020   11:28 PM 03/22/2020   12:07 PM 09/29/2018   11:17 AM 04/29/2018    2:20 PM 10/14/2017    5:21 PM 07/08/2014   10:45 AM  Advanced Directives  Does Patient Have a Medical Advance Directive? Yes No Yes Yes Yes Yes Yes  Type of Paramedic of [place];Living will   [place];Living will [place];Living will [place];Living will Mental Health Advance Directive;Healthcare Power of Attorney  Does patient want to make changes to medical advance directive?   No - Patient declined No - Patient declined   No - Patient declined  Copy of [place] in Chart? No - copy requested    No - copy requested  Yes  Would patient like information on creating a medical advance  directive?  No - Patient declined         Current Medications (verified) Outpatient Encounter Medications as of 09/24/2021  Medication Sig   acetaminophen (TYLENOL) 500 MG tablet Take 1,000 mg by mouth every 6 (six) hours as needed (Pain).   aspirin 81 MG tablet Take 81 mg by mouth daily.   clobetasol cream (TEMOVATE) 0.05 % Apply 1-2 times a day to [place] for 2 weeks   dicyclomine (BENTYL) 20 MG tablet Take 1 tablet (20 mg total) by mouth every 6 (six) hours as needed for spasms.   Evolocumab (REPATHA SURECLICK) 195 MG/ML SOAJ Inject 140 mg into the skin every 14 (fourteen) days.   ezetimibe (ZETIA) 10 MG tablet Take 1 tablet (10 mg total) by mouth daily.   gabapentin (NEURONTIN) 300 MG capsule Take 1 capsule (300 mg total) by mouth at bedtime.   meloxicam (MOBIC) 15 MG tablet Take 1 tablet (15 mg total) by mouth daily.   metoprolol tartrate (LOPRESSOR) 25 MG tablet Take 0.5 tablets (12.5 mg total) by mouth 2 (two) times daily.   nitroGLYCERIN (NITROSTAT) 0.4 MG SL tablet Place 1 tablet (0.4 mg total) under the tongue every 5 (five) minutes as needed for chest pain.   pantoprazole (PROTONIX) 40 MG tablet Take 1 tablet (40 mg total) by mouth daily.   ramipril (ALTACE) 10 MG capsule Take 1 capsule (10 mg total) by mouth daily.   rosuvastatin (CRESTOR) 40 MG tablet Take 1 tablet (40 mg total) by mouth  daily.   Vitamin D, Ergocalciferol, (DRISDOL) 1.25 MG (50000 UNIT) CAPS capsule Take 1 capsule (50,000 Units total) by mouth every 7 (seven) days.   No facility-administered encounter medications on file as of 09/24/2021.    Allergies (verified) Erythromycin, Penicillins, and Shellfish allergy   History: Past Medical History:  Diagnosis Date   Aortic valve sclerosis    echo 96/2229, soft systolic murmur   Arthritis    in right hip   Back pain    Basal cell carcinoma 05/31/2016   bcc left forehead  tx exc   Carotid artery disease (HCC)    40-59% bilateral, doppler December 2010    Cataract    Constipation    Coronary artery disease    a. BMS-mid RCA 11/2007 b. stress echo 02/2009 subtle inferior HK, lateral ST depressions with stress c. follow-up cath 02/2009: RCA stent patent, severe but stable stenosis of distal posterior lateral branch RCA. LV normal, med tx unless more sx c. ETT Myoview 08/27/12: negative for ischemia; EF 64%   Decreased hearing    Depression    Mild, August, 2012   Dyslipidemia    Easy bruising    Ejection fraction    EF normal, catheterization, 2011 /  EF 55%, echo, 2010   Fluid overload    Mild, stable since hospitalization in the past   GERD (gastroesophageal reflux disease)    occarionally, takes Pepcid over the counter   Heart murmur    has, occasional PVC's   History of right hip replacement    Hyperlipidemia    Hypertension    Joint pain    Myocardial infarction ([place])    2009   Palpitations    PVC (premature ventricular contraction)    per prior Holter monitor   Right hip pain 12/2009   Shellfish allergy    Stress fracture    Left foot   Trouble in sleeping    Vitamin B12 deficiency    Vitamin D deficiency    Past Surgical History:  Procedure Laterality Date   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/01/2005   BREAST BIOPSY Left    carcinoid tumor removal     colon-   CARDIAC CATHETERIZATION  1/211   Patent RCA stent, stenotic PLB supplying small distribution; medically managed   CATARACT EXTRACTION, BILATERAL     CORONARY ANGIOPLASTY WITH STENT PLACEMENT  11/2007   BMS-mid RCA   DIAGNOSTIC LAPAROSCOPY     1982 for endometriosis   DILATION AND CURETTAGE OF UTERUS  2005   SKIN CANCER EXCISION     TOTAL HIP ARTHROPLASTY Right 07/08/2014   Procedure: RIGHT TOTAL HIP ARTHROPLASTY ANTERIOR APPROACH;  Surgeon: Mcarthur Rossetti, MD;  Location: [facility];  Service: Orthopedics;  Laterality: Right;   WISDOM TOOTH EXTRACTION     Family History  Problem Relation Age of Onset   Heart attack Father 9   Hyperlipidemia Father     Hypertension Father    Sudden death Father    Stroke Mother    Hyperlipidemia Mother    Hypertension Mother    Heart disease Mother    Obesity Mother    Heart attack Paternal Grandfather    Heart disease Sister    BRCA 1/2 Neg Hx    Breast cancer Neg Hx    Esophageal cancer Neg Hx    Colon cancer Neg Hx    Pancreatic cancer Neg Hx    Liver disease Neg Hx    Stomach cancer Neg Hx    Social History  Socioeconomic History   Marital status: Divorced    Spouse name: Not on file   Number of children: 1   Years of education: 86   Highest education level: Not on file  Occupational History   Occupation: Retired - Retail buyer  Tobacco Use   Smoking status: Never   Smokeless tobacco: Never  Vaping Use   Vaping Use: Never used  Substance and Sexual Activity   Alcohol use: Yes    Comment: 1 glass of wine 3x/week   Drug use: No   Sexual activity: Yes  Other Topics Concern   Not on file  Social History Narrative   Right handed   Soda- sometimes   Drinks herbal tea   Fun: Dance, garden,    Denies religious beliefs effecting health care.   Denies abuse and feels safe at home   Social Determinants of Health   Financial Resource Strain: Low Risk  (09/24/2021)   Overall Financial Resource Strain (CARDIA)    Difficulty of Paying Living Expenses: Not hard at all  Food Insecurity: No Food Insecurity (09/24/2021)   Hunger Vital Sign    Worried About Running Out of Food in the Last Year: Never true    Ran Out of Food in the Last Year: Never true  Transportation Needs: No Transportation Needs (09/24/2021)   PRAPARE - Hydrologist (Medical): No    Lack of Transportation (Non-Medical): No  Physical Activity: Inactive (09/24/2021)   Exercise Vital Sign    Days of Exercise per Week: 0 days    Minutes of Exercise per Session: 0 min  Stress: No Stress Concern Present (09/24/2021)   [place]     Feeling of Stress : Not at all  Social Connections: Moderately Isolated (09/24/2021)   Social Connection and Isolation Panel [NHANES]    Frequency of Communication with Friends and Family: Three times a week    Frequency of Social Gatherings with Friends and Family: Three times a week    Attends Religious Services: Never    Active Member of Clubs or Organizations: No    Attends Music therapist: Never    Marital Status: Living with partner    Tobacco Counseling Counseling given: Not Answered   Clinical Intake:  Pre-visit preparation completed: Yes  Pain : No/denies pain     Nutritional Risks: None Diabetes: No  How often do you need to have someone help you when you read instructions, pamphlets, or other written materials from your doctor or pharmacy?: 1 - Never What is the last grade level you completed in school?: masters  Diabetic?no   Interpreter Needed?: No  Information entered by :: L>Wilson,LPN   Activities of Daily Living    09/24/2021    8:25 AM  In your present state of health, do you have any difficulty performing the following activities:  Hearing? 0  Vision? 0  Difficulty concentrating or making decisions? 0  Walking or climbing stairs? 0  Dressing or bathing? 0  Doing errands, shopping? 0  Preparing Food and eating ? N  Using the Toilet? N  In the past six months, have you accidently leaked urine? N  Do you have problems with loss of bowel control? N  Managing your Medications? N  Managing your Finances? N    Patient Care Team: Horald Pollen, MD as PCP - General (Internal Medicine) [place], Peter M, MD as PCP - Cardiology (Cardiology) Dennard Nip D,  MD as Consulting Physician (Family Medicine) [place], Peter M, MD as Consulting Physician (Cardiology) Delsa Sale, OD as Consulting Physician (Optometry) Lavonna Monarch, MD as Consulting Physician (Dermatology)  Indicate any recent Medical Services you may have received  from other than [facility] providers in the past year (date may be approximate).     Assessment:   This is a routine wellness examination for [place].  Hearing/Vision screen Vision Screening - Comments:: Annual eye exams wear glasses   Dietary issues and exercise activities discussed: Current Exercise Habits: The patient does not participate in regular exercise at present, Exercise limited by: orthopedic condition(s)   Goals Addressed   None    Depression Screen    09/24/2021    8:23 AM 09/24/2021    8:21 AM 04/05/2021    8:58 AM 03/22/2020   12:04 PM 04/29/2018    2:20 PM 06/16/2017    8:35 AM 02/25/2017   10:01 AM  PHQ 2/9 Scores  PHQ - 2 Score 0 0 0 0 0 2 0  PHQ- 9 Score     3 5     Fall Risk    09/24/2021    8:23 AM 04/05/2021    8:58 AM 03/22/2020   12:07 PM 04/29/2018    2:20 PM 02/25/2017   10:01 AM  Fall Risk   Falls in the past year? 0 0 0 0 No  Number falls in past yr: 0 0 0 0   Injury with Fall? 0 0 0    Risk for fall due to :   No Fall Risks    Follow up Falls evaluation completed;Education provided  Falls evaluation completed      [place]:  Any stairs in or around the home? Yes  If so, are there any without handrails? No  Home free of loose throw rugs in walkways, pet beds, electrical cords, etc? Yes  Adequate lighting in your home to reduce risk of falls? Yes   ASSISTIVE DEVICES UTILIZED TO PREVENT FALLS:  Life alert? No  Use of a cane, walker or w/c? No  Grab bars in the bathroom? No  Shower chair or bench in shower? No  Elevated toilet seat or a handicapped toilet? No     Cognitive Function:    Normal cognitive status assessed by telephone conversation  by this Nurse Health Advisor. No abnormalities found.      09/24/2021    8:26 AM  6CIT Screen  What Year? 0 points  What month? 0 points  What time? 0 points  Count back from 20 0 points  Months in reverse 0 points  Repeat phrase 0 points  Total Score 0 points     Immunizations Immunization History  Administered Date(s) Administered   Fluad Quad(high Dose 65+) 10/20/2018, 12/09/2019   Influenza, High Dose Seasonal PF 12/03/2016, 11/17/2017   Influenza-Unspecified 10/20/2014, 11/30/2020   PFIZER(Purple Top)SARS-COV-2 Vaccination 02/26/2019, 03/17/2019, 12/09/2019   Pneumococcal Conjugate-13 02/18/2014, 01/06/2015   Pneumococcal Polysaccharide-23 11/29/2015   Pneumococcal-Unspecified 02/18/2014   Tdap 11/29/2015   Zoster Recombinat (Shingrix) 12/26/2016, 02/25/2017    TDAP status: Up to date  Flu Vaccine status: Up to date  Pneumococcal vaccine status: Up to date  Covid-19 vaccine status: Completed vaccines  Qualifies for Shingles Vaccine? Yes   Zostavax completed Yes   Shingrix Completed?: Yes  Screening Tests Health Maintenance  Topic Date Due   COVID-19 Vaccine (4 - Booster for Pfizer series) 02/03/2020   PAP SMEAR-Modifier  04/12/2021  INFLUENZA VACCINE  09/18/2021   MAMMOGRAM  05/24/2022   COLONOSCOPY (Pts 45-59yrs Insurance coverage will need to be confirmed)  02/23/2023   TETANUS/TDAP  11/28/2025   Pneumonia Vaccine 70+ Years old  Completed   DEXA SCAN  Completed   Hepatitis C Screening  Completed   Zoster Vaccines- Shingrix  Completed   HPV VACCINES  Aged Out    Health Maintenance  Health Maintenance Due  Topic Date Due   COVID-19 Vaccine (4 - Booster for Pfizer series) 02/03/2020   PAP SMEAR-Modifier  04/12/2021   INFLUENZA VACCINE  09/18/2021    Colorectal cancer screening: Type of screening: Colonoscopy. Completed 02/22/2013. Repeat every 10 years  Mammogram status: Completed 04/05/02023. Repeat every year  Bone Density status: Completed 12/02/2017. Results reflect: Bone density results: OSTEOPENIA. Repeat every 5 years.  Lung Cancer Screening: (Low Dose CT Chest recommended if Age 9-80 years, 30 pack-year currently smoking OR have quit w/in 15years.) does not qualify.   Lung Cancer Screening  Referral: n/a  Additional Screening:  Hepatitis C Screening: does not qualify; Completed 06/06/2015  Vision Screening: Recommended annual ophthalmology exams for early detection of glaucoma and other disorders of the eye. Is the patient up to date with their annual eye exam?  No  Who is the provider or what is the name of the office in which the patient attends annual eye exams? Dr.Miller  If pt is not established with a provider, would they like to be referred to a provider to establish care? No .   Dental Screening: Recommended annual dental exams for proper oral hygiene  Community Resource Referral / Chronic Care Management: CRR required this visit?  No   CCM required this visit?  No      Plan:     I have personally reviewed and noted the following in the patient's chart:   Medical and social history Use of alcohol, tobacco or illicit drugs  Current medications and supplements including opioid prescriptions.  Functional ability and status Nutritional status Physical activity Advanced directives List of other physicians Hospitalizations, surgeries, and ER visits in previous 12 months Vitals Screenings to include cognitive, depression, and falls Referrals and appointments  In addition, I have reviewed and discussed with patient certain preventive protocols, quality metrics, and best practice recommendations. A written personalized care plan for preventive services as well as general preventive health recommendations were provided to patient.     Daphane Shepherd, LPN   2/0/6015   Nurse Notes: none

## 2021-09-25 ENCOUNTER — Encounter (HOSPITAL_BASED_OUTPATIENT_CLINIC_OR_DEPARTMENT_OTHER): Payer: Self-pay | Admitting: Physical Therapy

## 2021-09-25 ENCOUNTER — Ambulatory Visit (HOSPITAL_BASED_OUTPATIENT_CLINIC_OR_DEPARTMENT_OTHER): Payer: Medicare HMO | Attending: Sports Medicine | Admitting: Physical Therapy

## 2021-09-25 ENCOUNTER — Other Ambulatory Visit: Payer: Self-pay

## 2021-09-25 DIAGNOSIS — M5416 Radiculopathy, lumbar region: Secondary | ICD-10-CM | POA: Diagnosis not present

## 2021-09-25 DIAGNOSIS — G8929 Other chronic pain: Secondary | ICD-10-CM | POA: Diagnosis not present

## 2021-09-25 DIAGNOSIS — M6281 Muscle weakness (generalized): Secondary | ICD-10-CM | POA: Diagnosis not present

## 2021-09-25 DIAGNOSIS — M25561 Pain in right knee: Secondary | ICD-10-CM | POA: Insufficient documentation

## 2021-09-25 DIAGNOSIS — M79604 Pain in right leg: Secondary | ICD-10-CM | POA: Insufficient documentation

## 2021-09-25 DIAGNOSIS — R262 Difficulty in walking, not elsewhere classified: Secondary | ICD-10-CM | POA: Diagnosis not present

## 2021-09-25 DIAGNOSIS — M5136 Other intervertebral disc degeneration, lumbar region: Secondary | ICD-10-CM | POA: Diagnosis not present

## 2021-09-25 NOTE — Therapy (Signed)
OUTPATIENT PHYSICAL THERAPY THORACOLUMBAR EVALUATION   Patient Name: Courtney [name] MRN: [id number] DOB:10-Apr-1950, 71 y.o., female Today's Date: 09/25/2021   PT End of Session - 09/25/21 2112     Visit Number 1    Number of Visits 9    Date for PT Re-Evaluation 11/20/21    Authorization Type [employer] MCR    Progress Note Due on Visit 10    PT Start Time 1147    PT Stop Time 1230    PT Time Calculation (min) 43 min    Activity Tolerance Patient tolerated treatment well    Behavior During Therapy [facility] for tasks assessed/performed             Past Medical History:  Diagnosis Date   Aortic valve sclerosis    echo 35/7017, soft systolic murmur   Arthritis    in right hip   Back pain    Basal cell carcinoma 05/31/2016   bcc left forehead  tx exc   Carotid artery disease (HCC)    40-59% bilateral, doppler December 2010   Cataract    Constipation    Coronary artery disease    a. BMS-mid RCA 11/2007 b. stress echo 02/2009 subtle inferior HK, lateral ST depressions with stress c. follow-up cath 02/2009: RCA stent patent, severe but stable stenosis of distal posterior lateral branch RCA. LV normal, med tx unless more sx c. ETT Myoview 08/27/12: negative for ischemia; EF 64%   Decreased hearing    Depression    Mild, August, 2012   Dyslipidemia    Easy bruising    Ejection fraction    EF normal, catheterization, 2011 /  EF 55%, echo, 2010   Fluid overload    Mild, stable since hospitalization in the past   GERD (gastroesophageal reflux disease)    occarionally, takes Pepcid over the counter   Heart murmur    has, occasional PVC's   History of right hip replacement    Hyperlipidemia    Hypertension    Joint pain    Myocardial infarction ([place])    2009   Palpitations    PVC (premature ventricular contraction)    per prior Holter monitor   Right hip pain 12/2009   Shellfish allergy    Stress fracture    Left foot   Trouble in sleeping    Vitamin B12 deficiency     Vitamin D deficiency    Past Surgical History:  Procedure Laterality Date   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/01/2005   BREAST BIOPSY Left    carcinoid tumor removal     colon-   CARDIAC CATHETERIZATION  1/211   Patent RCA stent, stenotic PLB supplying small distribution; medically managed   CATARACT EXTRACTION, BILATERAL     CORONARY ANGIOPLASTY WITH STENT PLACEMENT  11/2007   BMS-mid RCA   DIAGNOSTIC LAPAROSCOPY     1982 for endometriosis   DILATION AND CURETTAGE OF UTERUS  2005   SKIN CANCER EXCISION     TOTAL HIP ARTHROPLASTY Right 07/08/2014   Procedure: RIGHT TOTAL HIP ARTHROPLASTY ANTERIOR APPROACH;  Surgeon: Mcarthur Rossetti, MD;  Location: [facility];  Service: Orthopedics;  Laterality: Right;   WISDOM TOOTH EXTRACTION     Patient Active Problem List   Diagnosis Date Noted   Stress 08/30/2021   Prediabetes 07/11/2021   Degenerative disc disease, lumbar 05/19/2020   Restless leg syndrome 05/15/2020   Degenerative disc disease, cervical 02/02/2020   Polyarthralgia 01/03/2020   Patellofemoral arthritis of right knee 06/16/2019  Vitamin D deficiency, Vit D = 43.2 (01/25/19), Rx vit D 50K IU every 2 weeks 08/04/2018   Cyst of right breast 05/07/2018   Chronic headache disorder 02/08/2018   Essential hypertension 11/17/2017   Insomnia 07/15/2017   Female stress incontinence 07/15/2017   Gastroesophageal reflux disease with esophagitis, Rx Protonix 02/25/2017   Osteopenia 06/27/2015   Osteoarthritis of right hip 07/08/2014   Carotid artery disease ([place])    Aortic valve sclerosis    Hyperlipidemia    Coronary artery disease     PCP: Mitchel Honour, MD  REFERRING PROVIDER: Glennon Mac, DO  REFERRING DIAG:  M54.16 (ICD-10-CM) - Lumbar radiculopathy  M79.604 (ICD-10-CM) - Right leg pain  M51.36 (ICD-10-CM) - DDD (degenerative disc disease), lumbar    Rationale for Evaluation and Treatment Rehabilitation  THERAPY DIAG:  Chronic pain of right knee  Muscle weakness  (generalized)  Difficulty in walking, not elsewhere classified  ONSET DATE: 09/01/21  SUBJECTIVE:                                                                                                                                                                                           SUBJECTIVE STATEMENT: Epidural in Feb, June, July- back and thigh throbbing are better but about 3 days after the last one I could hardly walk on my knee. To go to grocery store I can tolerate a walk to the door and back out. Brace helps a little but not much. Never did gait full ROM of Rt hip. Hold rails in shower and on stairs due to feeling of giving out.   PERTINENT HISTORY:  Rt THA, H/o knee pain that got better, L4/5 disk  PAIN:  Are you having pain? Yes: NPRS scale: 5 sitting, 8 walking/10 Pain location: anterior aspect Pain description: stabbing, breathtaking Aggravating factors: weight bearing Relieving factors: sit   PRECAUTIONS: Fall  WEIGHT BEARING RESTRICTIONS No  FALLS:  Has patient fallen in last 6 months? 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  PLOF: Independent  PATIENT GOALS decrease pain, walk   OBJECTIVE:   DIAGNOSTIC FINDINGS:  Mild arthritis per xray  PATIENT SURVEYS:  FOTO 39   COGNITION:  Overall cognitive status: Within functional limits for tasks assessed     SENSATION: Tingling anterior thigh  PALPATION: End feels WFL without pain Lateral tracking of patella  LOWER EXTREMITY ROM:     Excellent passive ROM through hip and knee  LOWER EXTREMITY MMT:    Active SLR available to 4" off of table actively, passively to 90  MMT Right eval Left eval  Hip flexion 24.1 27.5  Hip extension    Hip abduction 17.2 14.3  Hip adduction    Hip internal rotation    Hip external rotation    Knee flexion    Knee extension 46.3 42.6  Ankle dorsiflexion 29.2 28.9  Ankle plantarflexion    Ankle  inversion    Ankle eversion     (Blank rows = not tested)   GAIT: Antalgic on Rt, notbale IR of Rt LE    TODAY'S TREATMENT  Rt SKTC Hooklying ab set, +marching Sidelyign Clams Sidelying hip circles   PATIENT EDUCATION:  Education details: Geophysicist/field seismologist of condition, POC, HEP, exercise form/rationale Person educated: Patient Education method: Explanation, Demonstration, Tactile cues, Verbal cues, and Handouts Education comprehension: verbalized understanding, returned demonstration, verbal cues required, tactile cues required, and needs further education   HOME EXERCISE PROGRAM: [id number]  ASSESSMENT:  CLINICAL IMPRESSION: Patient is a 71 y.o. F who was seen today for physical therapy evaluation and treatment for knee pain that has increased .    OBJECTIVE IMPAIRMENTS Abnormal gait, decreased activity tolerance, decreased balance, decreased endurance, decreased mobility, difficulty walking, decreased strength, increased muscle spasms, postural dysfunction, and pain.   ACTIVITY LIMITATIONS lifting, bending, standing, squatting, stairs, transfers, bathing, and locomotion level  PARTICIPATION LIMITATIONS: meal prep, cleaning, laundry, driving, shopping, and community activity  PERSONAL FACTORS 1-2 comorbidities: multiple treatments for back pain, h/o Rt THA  are also affecting patient's functional outcome.   REHAB POTENTIAL: Good  CLINICAL DECISION MAKING: Evolving/moderate complexity  EVALUATION COMPLEXITY: Moderate   GOALS: Goals reviewed with patient? Yes  SHORT TERM GOALS: Target date: 10/16/21  Able to demo core control with pelvic strengthening activities Baseline: Goal status: INITIAL   LONG TERM GOALS: Target date:11/20/21  Pt will meet FOTO goal Baseline:  Goal status: INITIAL  2.  Able to demonstrate hip abd strength via dynamometry to meet age-appropriate functional level Baseline:  Goal status: INITIAL  3.  Able to walk through grocery store before  feeling limited by knee pain Baseline:  Goal status: INITIAL  4.  Able to demo SLR to at least 75 deg Baseline:  Goal status: INITIAL   PLAN: PT FREQUENCY: 1x/week  PT DURATION: 8 weeks  PLANNED INTERVENTIONS: Therapeutic exercises, Therapeutic activity, Neuromuscular re-education, Balance training, Gait training, Patient/Family education, Self Care, Joint mobilization, Stair training, Aquatic Therapy, Dry Needling, Electrical stimulation, Spinal mobilization, Cryotherapy, Moist heat, Taping, and Manual therapy.  PLAN FOR NEXT SESSION: progress lumbopelvic strength as tolerated  Leotha Westermeyer C. Kennetta Pavlovic PT, DPT 09/25/21 9:30 PM  Referring diagnosis?  M54.16 (ICD-10-CM) - Lumbar radiculopathy  M79.604 (ICD-10-CM) - Right leg pain  M51.36 (ICD-10-CM) - DDD (degenerative disc disease), lumbar   Treatment diagnosis? (if different than referring diagnosis) m25.561  m62.81  r26.2 What was this (referring dx) caused by? '[]'$  Surgery '[]'$  Fall '[x]'$  Ongoing issue '[]'$  Arthritis '[]'$  Other: ____________  Laterality: '[x]'$  Rt '[]'$  Lt '[]'$  Both  Check all possible CPT codes:  *CHOOSE 10 OR LESS*    '[]'$  97110 (Therapeutic Exercise)  '[]'$  92507 (SLP Treatment)  '[]'$  97112 (Neuro Re-ed)   '[]'$  92526 (Swallowing Treatment)   '[]'$  97116 (Gait Training)   '[]'$  [id number] (Cognitive Training, 1st 15 minutes) '[]'$  97140 (Manual Therapy)   '[]'$  97130 (Cognitive Training, each add'l 15 minutes)  '[]'$  97164 (Re-evaluation)                              '[]'$  Other, List CPT  Code ____________  '[]'$  08676 (Therapeutic Activities)     '[]'$  97535 (Self Care)   '[x]'$  All codes above (97110 - 97535)  '[]'$  97012 (Mechanical Traction)  '[]'$  97014 (E-stim Unattended)  '[]'$  97032 (E-stim manual)  '[]'$  97033 (Ionto)  '[]'$  97035 (Ultrasound) '[x]'$  97750 (Physical Performance Training) '[x]'$  [id number] (Aquatic Therapy) '[]'$  97016 (Vasopneumatic Device) '[]'$  [id number] (Paraffin) '[]'$  97034 (Contrast Bath) '[]'$  97597 (Wound Care 1st 20 sq cm) '[]'$  97598 (Wound Care each add'l  20 sq cm) '[]'$  97760 (Orthotic Fabrication, Fitting, Training Initial) '[]'$  [id number] (Prosthetic Management and Training Initial) '[]'$  [id number] (Orthotic or Prosthetic Training/ Modification Subsequent)

## 2021-09-26 ENCOUNTER — Encounter (INDEPENDENT_AMBULATORY_CARE_PROVIDER_SITE_OTHER): Payer: Self-pay

## 2021-09-28 ENCOUNTER — Other Ambulatory Visit: Payer: Self-pay

## 2021-09-28 ENCOUNTER — Encounter: Payer: Self-pay | Admitting: Family Medicine

## 2021-09-28 DIAGNOSIS — M25561 Pain in right knee: Secondary | ICD-10-CM

## 2021-10-03 ENCOUNTER — Ambulatory Visit (HOSPITAL_BASED_OUTPATIENT_CLINIC_OR_DEPARTMENT_OTHER): Payer: Medicare HMO | Admitting: Physical Therapy

## 2021-10-03 ENCOUNTER — Encounter (HOSPITAL_BASED_OUTPATIENT_CLINIC_OR_DEPARTMENT_OTHER): Payer: Self-pay | Admitting: Physical Therapy

## 2021-10-03 DIAGNOSIS — G8929 Other chronic pain: Secondary | ICD-10-CM

## 2021-10-03 DIAGNOSIS — M6281 Muscle weakness (generalized): Secondary | ICD-10-CM

## 2021-10-03 DIAGNOSIS — M5416 Radiculopathy, lumbar region: Secondary | ICD-10-CM | POA: Diagnosis not present

## 2021-10-03 DIAGNOSIS — R262 Difficulty in walking, not elsewhere classified: Secondary | ICD-10-CM

## 2021-10-03 DIAGNOSIS — M5136 Other intervertebral disc degeneration, lumbar region: Secondary | ICD-10-CM | POA: Diagnosis not present

## 2021-10-03 DIAGNOSIS — M25561 Pain in right knee: Secondary | ICD-10-CM | POA: Diagnosis not present

## 2021-10-03 DIAGNOSIS — M79604 Pain in right leg: Secondary | ICD-10-CM | POA: Diagnosis not present

## 2021-10-03 NOTE — Therapy (Signed)
OUTPATIENT PHYSICAL THERAPY THORACOLUMBAR TREATMENT   Patient Name: Courtney [name] MRN: [id number] DOB:10-31-1950, 71 y.o., female Today's Date: 10/03/2021   PT End of Session - 10/03/21 0854     Visit Number 2    Number of Visits 9    Date for PT Re-Evaluation 11/20/21    Authorization Type [employer] MCR    Progress Note Due on Visit 10    PT Start Time 0846    PT Stop Time 0926    PT Time Calculation (min) 40 min    Activity Tolerance Patient tolerated treatment well    Behavior During Therapy [facility] for tasks assessed/performed              Past Medical History:  Diagnosis Date   Aortic valve sclerosis    echo 94/7654, soft systolic murmur   Arthritis    in right hip   Back pain    Basal cell carcinoma 05/31/2016   bcc left forehead  tx exc   Carotid artery disease (HCC)    40-59% bilateral, doppler December 2010   Cataract    Constipation    Coronary artery disease    a. BMS-mid RCA 11/2007 b. stress echo 02/2009 subtle inferior HK, lateral ST depressions with stress c. follow-up cath 02/2009: RCA stent patent, severe but stable stenosis of distal posterior lateral branch RCA. LV normal, med tx unless more sx c. ETT Myoview 08/27/12: negative for ischemia; EF 64%   Decreased hearing    Depression    Mild, August, 2012   Dyslipidemia    Easy bruising    Ejection fraction    EF normal, catheterization, 2011 /  EF 55%, echo, 2010   Fluid overload    Mild, stable since hospitalization in the past   GERD (gastroesophageal reflux disease)    occarionally, takes Pepcid over the counter   Heart murmur    has, occasional PVC's   History of right hip replacement    Hyperlipidemia    Hypertension    Joint pain    Myocardial infarction ([place])    2009   Palpitations    PVC (premature ventricular contraction)    per prior Holter monitor   Right hip pain 12/2009   Shellfish allergy    Stress fracture    Left foot   Trouble in sleeping    Vitamin B12 deficiency     Vitamin D deficiency    Past Surgical History:  Procedure Laterality Date   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/01/2005   BREAST BIOPSY Left    carcinoid tumor removal     colon-   CARDIAC CATHETERIZATION  1/211   Patent RCA stent, stenotic PLB supplying small distribution; medically managed   CATARACT EXTRACTION, BILATERAL     CORONARY ANGIOPLASTY WITH STENT PLACEMENT  11/2007   BMS-mid RCA   DIAGNOSTIC LAPAROSCOPY     1982 for endometriosis   DILATION AND CURETTAGE OF UTERUS  2005   SKIN CANCER EXCISION     TOTAL HIP ARTHROPLASTY Right 07/08/2014   Procedure: RIGHT TOTAL HIP ARTHROPLASTY ANTERIOR APPROACH;  Surgeon: Mcarthur Rossetti, MD;  Location: [facility];  Service: Orthopedics;  Laterality: Right;   WISDOM TOOTH EXTRACTION     Patient Active Problem List   Diagnosis Date Noted   Stress 08/30/2021   Prediabetes 07/11/2021   Degenerative disc disease, lumbar 05/19/2020   Restless leg syndrome 05/15/2020   Degenerative disc disease, cervical 02/02/2020   Polyarthralgia 01/03/2020   Patellofemoral arthritis of right knee 06/16/2019  Vitamin D deficiency, Vit D = 43.2 (01/25/19), Rx vit D 50K IU every 2 weeks 08/04/2018   Cyst of right breast 05/07/2018   Chronic headache disorder 02/08/2018   Essential hypertension 11/17/2017   Insomnia 07/15/2017   Female stress incontinence 07/15/2017   Gastroesophageal reflux disease with esophagitis, Rx Protonix 02/25/2017   Osteopenia 06/27/2015   Osteoarthritis of right hip 07/08/2014   Carotid artery disease ([place])    Aortic valve sclerosis    Hyperlipidemia    Coronary artery disease     PCP: Mitchel Honour, MD  REFERRING PROVIDER: Glennon Mac, DO  REFERRING DIAG:  M54.16 (ICD-10-CM) - Lumbar radiculopathy  M79.604 (ICD-10-CM) - Right leg pain  M51.36 (ICD-10-CM) - DDD (degenerative disc disease), lumbar    Rationale for Evaluation and Treatment Rehabilitation  THERAPY DIAG:  Chronic pain of right knee  Muscle weakness  (generalized)  Difficulty in walking, not elsewhere classified  ONSET DATE: 09/01/21  SUBJECTIVE:                                                                                                                                                                                           SUBJECTIVE STATEMENT:  Doing Ok, leg is still bothering me; having MRI for my knee tomorrow. Going to the beach this weekend. Sore from HEP.   PERTINENT HISTORY:  Rt THA, H/o knee pain that got better, L4/5 disk  PAIN:  Are you having pain? 8/10 in knee 2/10 in back; pain in anterior knee sharp (walking/weight bearing makes pain worse, voltaren and ice/heat make it better)   PRECAUTIONS: Fall  WEIGHT BEARING RESTRICTIONS No  FALLS:  Has patient fallen in last 6 months? 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  PLOF: Independent  PATIENT GOALS decrease pain, walk   OBJECTIVE:   DIAGNOSTIC FINDINGS:  Mild arthritis per xray  PATIENT SURVEYS:  FOTO 39   COGNITION:  Overall cognitive status: Within functional limits for tasks assessed     SENSATION: Tingling anterior thigh  PALPATION: End feels WFL without pain Lateral tracking of patella  LOWER EXTREMITY ROM:     Excellent passive ROM through hip and knee  LOWER EXTREMITY MMT:    Active SLR available to 4" off of table actively, passively to 90  MMT Right eval Left eval  Hip flexion 24.1 27.5  Hip extension    Hip abduction 17.2 14.3  Hip adduction    Hip internal rotation    Hip external rotation    Knee flexion    Knee extension 46.3 42.6  Ankle dorsiflexion 29.2 28.9  Ankle plantarflexion    Ankle inversion    Ankle eversion     (Blank rows = not tested)   GAIT: Antalgic on Rt, notbale IR of Rt LE    TODAY'S TREATMENT   8/16  Nustep L3 x6 minutes  Bridges x10 Supine clams with red TB 1x10  2 second holds Bridge + clam x10 red TB 2 second  holds Walking bridges 1x8  PPT 1x10 with 3 second holds  PPT 1x10 B with march PPT 1x10 B with low grade SLR  R piriformis stretch 2x30 seconds B L HS and piriformis stretches 2x30 seconds each  Sidelying clams red TB 1x10 B Prone hip extensions 1x10 B red TB             Hip hikes 1x10 B   Eval Rt SKTC Hooklying ab set, +marching Sidelyign Clams Sidelying hip circles   PATIENT EDUCATION:  Education details: Geophysicist/field seismologist of condition, POC, HEP, exercise form/rationale Person educated: Patient Education method: Consulting civil engineer, Demonstration, Tactile cues, Verbal cues, and Handouts Education comprehension: verbalized understanding, returned demonstration, verbal cues required, tactile cues required, and needs further education   HOME EXERCISE PROGRAM: [id number]  ASSESSMENT:  CLINICAL IMPRESSION:  Birdella arrives doing OK today, still having a lot of pain in her knee greater than her back. We warmed up on the Nustep then focused on core and hip strengthening primarily as pain allowed today. She is hopeful that the MRI will show significant findings in her knee. Will continue to progress as able and tolerated.   OBJECTIVE IMPAIRMENTS Abnormal gait, decreased activity tolerance, decreased balance, decreased endurance, decreased mobility, difficulty walking, decreased strength, increased muscle spasms, postural dysfunction, and pain.   ACTIVITY LIMITATIONS lifting, bending, standing, squatting, stairs, transfers, bathing, and locomotion level  PARTICIPATION LIMITATIONS: meal prep, cleaning, laundry, driving, shopping, and community activity  PERSONAL FACTORS 1-2 comorbidities: multiple treatments for back pain, h/o Rt THA  are also affecting patient's functional outcome.   REHAB POTENTIAL: Good  CLINICAL DECISION MAKING: Evolving/moderate complexity  EVALUATION COMPLEXITY: Moderate   GOALS: Goals reviewed with patient? Yes  SHORT TERM GOALS: Target date: 10/16/21  Able to demo  core control with pelvic strengthening activities Baseline: Goal status: INITIAL   LONG TERM GOALS: Target date:11/20/21  Pt will meet FOTO goal Baseline:  Goal status: INITIAL  2.  Able to demonstrate hip abd strength via dynamometry to meet age-appropriate functional level Baseline:  Goal status: INITIAL  3.  Able to walk through grocery store before feeling limited by knee pain Baseline:  Goal status: INITIAL  4.  Able to demo SLR to at least 75 deg Baseline:  Goal status: INITIAL   PLAN: PT FREQUENCY: 1x/week  PT DURATION: 8 weeks  PLANNED INTERVENTIONS: Therapeutic exercises, Therapeutic activity, Neuromuscular re-education, Balance training, Gait training, Patient/Family education, Self Care, Joint mobilization, Stair training, Aquatic Therapy, Dry Needling, Electrical stimulation, Spinal mobilization, Cryotherapy, Moist heat, Taping, and Manual therapy.  PLAN FOR NEXT SESSION: progress lumbopelvic strength as tolerated   Graves Nipp U PT DPT PN2  10/03/2021, 9:29 AM   Referring diagnosis?  M54.16 (ICD-10-CM) - Lumbar radiculopathy  M79.604 (ICD-10-CM) - Right leg pain  M51.36 (ICD-10-CM) - DDD (degenerative disc disease), lumbar   Treatment diagnosis? (if different than referring diagnosis) m25.561  m62.81  r26.2 What was this (referring dx) caused by? '[]'$  Surgery '[]'$  Fall '[x]'$  Ongoing issue '[]'$  Arthritis '[]'$  Other: ____________  Laterality: '[x]'$  Rt '[]'$  Lt '[]'$  Both  Check all possible CPT codes:  *CHOOSE 10 OR  LESS*    '[]'$  [id number] (Therapeutic Exercise)  '[]'$  92507 (SLP Treatment)  '[]'$  [id number] (Neuro Re-ed)   '[]'$  92526 (Swallowing Treatment)   '[]'$  97116 (Gait Training)   '[]'$  [id number] (Cognitive Training, 1st 15 minutes) '[]'$  97140 (Manual Therapy)   '[]'$  97130 (Cognitive Training, each add'l 15 minutes)  '[]'$  97164 (Re-evaluation)                              '[]'$  Other, List CPT Code ____________  '[]'$  97530 (Therapeutic Activities)     '[]'$  34356 (Self Care)   '[x]'$  All codes above  (97110 - 97535)  '[]'$  97012 (Mechanical Traction)  '[]'$  97014 (E-stim Unattended)  '[]'$  97032 (E-stim manual)  '[]'$  97033 (Ionto)  '[]'$  97035 (Ultrasound) '[x]'$  97750 (Physical Performance Training) '[x]'$  [id number] (Aquatic Therapy) '[]'$  97016 (Vasopneumatic Device) '[]'$  [id number] (Paraffin) '[]'$  97034 (Contrast Bath) '[]'$  97597 (Wound Care 1st 20 sq cm) '[]'$  97598 (Wound Care each add'l 20 sq cm) '[]'$  97760 (Orthotic Fabrication, Fitting, Training Initial) '[]'$  [id number] (Prosthetic Management and Training Initial) '[]'$  [id number] (Orthotic or Prosthetic Training/ Modification Subsequent)

## 2021-10-04 ENCOUNTER — Ambulatory Visit (INDEPENDENT_AMBULATORY_CARE_PROVIDER_SITE_OTHER): Payer: Medicare HMO | Admitting: Family Medicine

## 2021-10-04 ENCOUNTER — Ambulatory Visit
Admission: RE | Admit: 2021-10-04 | Discharge: 2021-10-04 | Disposition: A | Discharge status: Home / Self Care | Payer: Medicare HMO | Source: Ambulatory Visit | Attending: Family Medicine | Admitting: Family Medicine

## 2021-10-04 DIAGNOSIS — M7121 Synovial cyst of popliteal space [Baker], right knee: Secondary | ICD-10-CM | POA: Diagnosis not present

## 2021-10-04 DIAGNOSIS — M25561 Pain in right knee: Secondary | ICD-10-CM

## 2021-10-04 DIAGNOSIS — M25461 Effusion, right knee: Secondary | ICD-10-CM | POA: Diagnosis not present

## 2021-10-04 DIAGNOSIS — M1711 Unilateral primary osteoarthritis, right knee: Secondary | ICD-10-CM | POA: Diagnosis not present

## 2021-10-08 ENCOUNTER — Ambulatory Visit: Payer: Medicare HMO | Admitting: Emergency Medicine

## 2021-10-15 ENCOUNTER — Encounter: Payer: Self-pay | Admitting: Emergency Medicine

## 2021-10-15 ENCOUNTER — Ambulatory Visit (INDEPENDENT_AMBULATORY_CARE_PROVIDER_SITE_OTHER): Payer: Medicare HMO | Admitting: Emergency Medicine

## 2021-10-15 VITALS — BP 126/74 | HR 46 | Temp 98.0°F | Ht 60.0 in | Wt 163.5 lb

## 2021-10-15 DIAGNOSIS — G8929 Other chronic pain: Secondary | ICD-10-CM | POA: Insufficient documentation

## 2021-10-15 DIAGNOSIS — M5136 Other intervertebral disc degeneration, lumbar region: Secondary | ICD-10-CM | POA: Diagnosis not present

## 2021-10-15 DIAGNOSIS — M25561 Pain in right knee: Secondary | ICD-10-CM

## 2021-10-15 DIAGNOSIS — I2510 Atherosclerotic heart disease of native coronary artery without angina pectoris: Secondary | ICD-10-CM | POA: Diagnosis not present

## 2021-10-15 DIAGNOSIS — M1711 Unilateral primary osteoarthritis, right knee: Secondary | ICD-10-CM

## 2021-10-15 DIAGNOSIS — K2100 Gastro-esophageal reflux disease with esophagitis, without bleeding: Secondary | ICD-10-CM

## 2021-10-15 DIAGNOSIS — F4322 Adjustment disorder with anxiety: Secondary | ICD-10-CM | POA: Diagnosis not present

## 2021-10-15 DIAGNOSIS — E7800 Pure hypercholesterolemia, unspecified: Secondary | ICD-10-CM | POA: Diagnosis not present

## 2021-10-15 DIAGNOSIS — I10 Essential (primary) hypertension: Secondary | ICD-10-CM | POA: Diagnosis not present

## 2021-10-15 MED ORDER — ACETAMINOPHEN-CODEINE 300-30 MG PO TABS: 1.0000 | ORAL_TABLET | Freq: Four times a day (QID) | ORAL | 0 refills | Status: DC | PRN

## 2021-10-15 NOTE — Patient Instructions (Signed)
Health Maintenance After Age 73 After age 73, you are at a higher risk for certain long-term diseases and infections as well as injuries from falls. Falls are a major cause of broken bones and head injuries in people who are older than age 73. Getting regular preventive care can help to keep you healthy and well. Preventive care includes getting regular testing and making lifestyle changes as recommended by your health care provider. Talk with your health care provider about: Which screenings and tests you should have. A screening is a test that checks for a disease when you have no symptoms. A diet and exercise plan that is right for you. What should I know about screenings and tests to prevent falls? Screening and testing are the best ways to find a health problem early. Early diagnosis and treatment give you the best chance of managing medical conditions that are common after age 73. Certain conditions and lifestyle choices may make you more likely to have a fall. Your health care provider may recommend: Regular vision checks. Poor vision and conditions such as cataracts can make you more likely to have a fall. If you wear glasses, make sure to get your prescription updated if your vision changes. Medicine review. Work with your health care provider to regularly review all of the medicines you are taking, including over-the-counter medicines. Ask your health care provider about any side effects that may make you more likely to have a fall. Tell your health care provider if any medicines that you take make you feel dizzy or sleepy. Strength and balance checks. Your health care provider may recommend certain tests to check your strength and balance while standing, walking, or changing positions. Foot health exam. Foot pain and numbness, as well as not wearing proper footwear, can make you more likely to have a fall. Screenings, including: Osteoporosis screening. Osteoporosis is a condition that causes  the bones to get weaker and break more easily. Blood pressure screening. Blood pressure changes and medicines to control blood pressure can make you feel dizzy. Depression screening. You may be more likely to have a fall if you have a fear of falling, feel depressed, or feel unable to do activities that you used to do. Alcohol use screening. Using too much alcohol can affect your balance and may make you more likely to have a fall. Follow these instructions at home: Lifestyle Do not drink alcohol if: Your health care provider tells you not to drink. If you drink alcohol: Limit how much you have to: 0-1 drink a day for women. 0-2 drinks a day for men. Know how much alcohol is in your drink. In the [place] one drink equals one 12 oz bottle of beer (355 mL), one 5 oz glass of wine (148 mL), or one 1 oz glass of hard liquor (44 mL). Do not use any products that contain nicotine or tobacco. These products include cigarettes, chewing tobacco, and vaping devices, such as e-cigarettes. If you need help quitting, ask your health care provider. Activity  Follow a regular exercise program to stay fit. This will help you maintain your balance. Ask your health care provider what types of exercise are appropriate for you. If you need a cane or walker, use it as recommended by your health care provider. Wear supportive shoes that have nonskid soles. Safety  Remove any tripping hazards, such as rugs, cords, and clutter. Install safety equipment such as grab bars in bathrooms and safety rails on stairs. Keep rooms and walkways   well-lit. General instructions Talk with your health care provider about your risks for falling. Tell your health care provider if: You fall. Be sure to tell your health care provider about all falls, even ones that seem minor. You feel dizzy, tiredness (fatigue), or off-balance. Take over-the-counter and prescription medicines only as told by your health care provider. These include  supplements. Eat a healthy diet and maintain a healthy weight. A healthy diet includes low-fat dairy products, low-fat (lean) meats, and fiber from whole grains, beans, and lots of fruits and vegetables. Stay current with your vaccines. Schedule regular health, dental, and eye exams. Summary Having a healthy lifestyle and getting preventive care can help to protect your health and wellness after age 73. Screening and testing are the best way to find a health problem early and help you avoid having a fall. Early diagnosis and treatment give you the best chance for managing medical conditions that are more common for people who are older than age 73. Falls are a major cause of broken bones and head injuries in people who are older than age 73. Take precautions to prevent a fall at home. Work with your health care provider to learn what changes you can make to improve your health and wellness and to prevent falls. This information is not intended to replace advice given to you by your health care provider. Make sure you discuss any questions you have with your health care provider. Document Revised: 06/26/2020 Document Reviewed: 06/26/2020 Elsevier Patient Education  2023 Elsevier Inc.  

## 2021-10-15 NOTE — Assessment & Plan Note (Signed)
Creating chronic lumbar pain and affecting quality of life Has neurosurgical appointment next Wednesday

## 2021-10-15 NOTE — Assessment & Plan Note (Signed)
Active and affecting quality of life. May take Tylenol with codeine for moderate to severe pain as needed Has orthopedic evaluation scheduled for next Wednesday Will need MRI of right knee in the near future

## 2021-10-15 NOTE — Assessment & Plan Note (Signed)
Well-controlled hypertension. Continue ramipril 10 mg daily. BP Readings from Last 3 Encounters:  10/15/21 126/74  09/14/21 120/82  09/12/21 124/90

## 2021-10-15 NOTE — Assessment & Plan Note (Signed)
Stable.  Presently taking pantoprazole 40 mg daily.

## 2021-10-15 NOTE — Progress Notes (Signed)
Courtney [name] 71 y.o.   Chief Complaint  Patient presents with   Follow-up    6 mnth f/u appt , pt is having knee issues, patient sees Dr. Tamala Julian . Wants something for pain, tylenol not working. Patient cant take NSAIDS, Sees Dr. Tamala Julian next week     HISTORY OF PRESENT ILLNESS: This is a 71 y.o. female complaining of right knee pain. Tylenol not working very well.  Unable to take NSAIDs. Has several chronic medical problems including the following: 1.  Coronary artery disease with history of ischemic heart disease status post MI 2009.  Presently on beta-blocker and daily baby aspirin.  No need for nitroglycerin since MI. Doing well.  Recent echocardiogram within normal limits except for aortic valve sclerosis.  Sees cardiologist on a regular basis. 2.  Hypertension presently on ramipril 10 mg daily. 3.  Dyslipidemia presently on Crestor, Zetia and Repatha injections 4.  Osteoarthritis with chronic low back and lower extremity pains Scheduled to see both neurosurgeon and orthopedist in 2 days. 5.  Restless leg syndrome much better on gabapentin 6.  GERD on daily Protonix 40 mg with good results 7.  Insomnia.  Tylenol PM helps   HPI   Prior to Admission medications   Medication Sig Start Date End Date Taking? Authorizing Provider  acetaminophen (TYLENOL) 500 MG tablet Take 1,000 mg by mouth every 6 (six) hours as needed (Pain).   Yes [provider]  aspirin 81 MG tablet Take 81 mg by mouth daily.   Yes [provider]  clobetasol cream (TEMOVATE) 0.05 % Apply 1-2 times a day to [place] for 2 weeks 07/02/21  Yes Lavonna Monarch, MD  dicyclomine (BENTYL) 20 MG tablet Take 1 tablet (20 mg total) by mouth every 6 (six) hours as needed for spasms. 03/27/20  Yes Marrian Salvage, FNP  Evolocumab (REPATHA SURECLICK) 071 MG/ML SOAJ Inject 140 mg into the skin every 14 (fourteen) days. 03/29/21  Yes [place]ique, Peter M, MD  ezetimibe (ZETIA) 10 MG tablet Take 1 tablet (10 mg  total) by mouth daily. 03/26/21 03/21/22 Yes [place], Peter M, MD  gabapentin (NEURONTIN) 300 MG capsule Take 1 capsule (300 mg total) by mouth at bedtime. 04/05/21  Yes Larayah Clute, Ines Bloomer, MD  meloxicam (MOBIC) 15 MG tablet Take 1 tablet (15 mg total) by mouth daily. 08/22/21  Yes Glennon Mac, DO  metoprolol tartrate (LOPRESSOR) 25 MG tablet Take 0.5 tablets (12.5 mg total) by mouth 2 (two) times daily. 03/26/21  Yes [place], Peter M, MD  nitroGLYCERIN (NITROSTAT) 0.4 MG SL tablet Place 1 tablet (0.4 mg total) under the tongue every 5 (five) minutes as needed for chest pain. 10/07/18  Yes [place], Peter M, MD  pantoprazole (PROTONIX) 40 MG tablet Take 1 tablet (40 mg total) by mouth daily. 04/05/21  Yes Sheran Newstrom, Ines Bloomer, MD  ramipril (ALTACE) 10 MG capsule Take 1 capsule (10 mg total) by mouth daily. 09/21/20  Yes [place], Peter M, MD  rosuvastatin (CRESTOR) 40 MG tablet Take 1 tablet (40 mg total) by mouth daily. 03/26/21  Yes [place], Peter M, MD  Vitamin D, Ergocalciferol, (DRISDOL) 1.25 MG (50000 UNIT) CAPS capsule Take 1 capsule (50,000 Units total) by mouth every 7 (seven) days. 08/30/21  Yes Dennard Nip D, MD    Allergies  Allergen Reactions   Erythromycin Hives   Penicillins Hives    Has patient had a PCN reaction causing immediate rash, facial/tongue/throat swelling, SOB or lightheadedness with hypotension: No Has patient had  a PCN reaction causing severe rash involving mucus membranes or skin necrosis: No Has patient had a PCN reaction that required hospitalization: No Has patient had a PCN reaction occurring within the last 10 years: No If all of the above answers are "NO", then may proceed with Cephalosporin use.    Shellfish Allergy Hives    Patient Active Problem List   Diagnosis Date Noted   Stress 08/30/2021   Prediabetes 07/11/2021   Degenerative disc disease, lumbar 05/19/2020   Restless leg syndrome 05/15/2020   Degenerative disc disease, cervical 02/02/2020    Polyarthralgia 01/03/2020   Patellofemoral arthritis of right knee 06/16/2019   Vitamin D deficiency, Vit D = 43.2 (01/25/19), Rx vit D 50K IU every 2 weeks 08/04/2018   Cyst of right breast 05/07/2018   Chronic headache disorder 02/08/2018   Essential hypertension 11/17/2017   Insomnia 07/15/2017   Female stress incontinence 07/15/2017   Gastroesophageal reflux disease with esophagitis, Rx Protonix 02/25/2017   Osteopenia 06/27/2015   Osteoarthritis of right hip 07/08/2014   Carotid artery disease ([place])    Aortic valve sclerosis    Hyperlipidemia    Coronary artery disease     Past Medical History:  Diagnosis Date   Aortic valve sclerosis    echo 83/3383, soft systolic murmur   Arthritis    in right hip   Back pain    Basal cell carcinoma 05/31/2016   bcc left forehead  tx exc   Carotid artery disease ([facility])    40-59% bilateral, doppler December 2010   Cataract    Constipation    Coronary artery disease    a. BMS-mid RCA 11/2007 b. stress echo 02/2009 subtle inferior HK, lateral ST depressions with stress c. follow-up cath 02/2009: RCA stent patent, severe but stable stenosis of distal posterior lateral branch RCA. LV normal, med tx unless more sx c. ETT Myoview 08/27/12: negative for ischemia; EF 64%   Decreased hearing    Depression    Mild, August, 2012   Dyslipidemia    Easy bruising    Ejection fraction    EF normal, catheterization, 2011 /  EF 55%, echo, 2010   Fluid overload    Mild, stable since hospitalization in the past   GERD (gastroesophageal reflux disease)    occarionally, takes Pepcid over the counter   Heart murmur    has, occasional PVC's   History of right hip replacement    Hyperlipidemia    Hypertension    Joint pain    Myocardial infarction ([place])    2009   Palpitations    PVC (premature ventricular contraction)    per prior Holter monitor   Right hip pain 12/2009   Shellfish allergy    Stress fracture    Left foot   Trouble in sleeping     Vitamin B12 deficiency    Vitamin D deficiency     Past Surgical History:  Procedure Laterality Date   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/01/2005   BREAST BIOPSY Left    carcinoid tumor removal     colon-   CARDIAC CATHETERIZATION  1/211   Patent RCA stent, stenotic PLB supplying small distribution; medically managed   CATARACT EXTRACTION, BILATERAL     CORONARY ANGIOPLASTY WITH STENT PLACEMENT  11/2007   BMS-mid RCA   DIAGNOSTIC LAPAROSCOPY     1982 for endometriosis   DILATION AND CURETTAGE OF UTERUS  2005   SKIN CANCER EXCISION     TOTAL HIP ARTHROPLASTY Right 07/08/2014  Procedure: RIGHT TOTAL HIP ARTHROPLASTY ANTERIOR APPROACH;  Surgeon: Mcarthur Rossetti, MD;  Location: [facility];  Service: Orthopedics;  Laterality: Right;   WISDOM TOOTH EXTRACTION      Social History   Socioeconomic History   Marital status: Divorced    Spouse name: Not on file   Number of children: 1   Years of education: 69   Highest education level: Not on file  Occupational History   Occupation: Retired - Retail buyer  Tobacco Use   Smoking status: Never   Smokeless tobacco: Never  Vaping Use   Vaping Use: Never used  Substance and Sexual Activity   Alcohol use: Yes    Comment: 1 glass of wine 3x/week   Drug use: No   Sexual activity: Yes  Other Topics Concern   Not on file  Social History Narrative   Right handed   Soda- sometimes   Drinks herbal tea   Fun: Dance, garden,    Denies religious beliefs effecting health care.   Denies abuse and feels safe at home   Social Determinants of Health   Financial Resource Strain: Low Risk  (09/24/2021)   Overall Financial Resource Strain (CARDIA)    Difficulty of Paying Living Expenses: Not hard at all  Food Insecurity: No Food Insecurity (09/24/2021)   Hunger Vital Sign    Worried About Running Out of Food in the Last Year: Never true    Ran Out of Food in the Last Year: Never true  Transportation Needs: No Transportation Needs (09/24/2021)    PRAPARE - Hydrologist (Medical): No    Lack of Transportation (Non-Medical): No  Physical Activity: Inactive (09/24/2021)   Exercise Vital Sign    Days of Exercise per Week: 0 days    Minutes of Exercise per Session: 0 min  Stress: No Stress Concern Present (09/24/2021)   [place]    Feeling of Stress : Not at all  Social Connections: Moderately Isolated (09/24/2021)   Social Connection and Isolation Panel [NHANES]    Frequency of Communication with Friends and Family: Three times a week    Frequency of Social Gatherings with Friends and Family: Three times a week    Attends Religious Services: Never    Active Member of Clubs or Organizations: No    Attends Archivist Meetings: Never    Marital Status: Living with partner  Intimate Partner Violence: Not At Risk (09/24/2021)   Humiliation, Afraid, Rape, and Kick questionnaire    Fear of Current or Ex-Partner: No    Emotionally Abused: No    Physically Abused: No    Sexually Abused: No    Family History  Problem Relation Age of Onset   Heart attack Father 19   Hyperlipidemia Father    Hypertension Father    Sudden death Father    Stroke Mother    Hyperlipidemia Mother    Hypertension Mother    Heart disease Mother    Obesity Mother    Heart attack Paternal Grandfather    Heart disease Sister    BRCA 1/2 Neg Hx    Breast cancer Neg Hx    Esophageal cancer Neg Hx    Colon cancer Neg Hx    Pancreatic cancer Neg Hx    Liver disease Neg Hx    Stomach cancer Neg Hx      Review of Systems  Constitutional: Negative.  Negative for chills and fever.  HENT: Negative.  Negative for congestion and sore throat.   Respiratory: Negative.  Negative for cough and shortness of breath.   Cardiovascular: Negative.  Negative for chest pain and palpitations.  Gastrointestinal: Negative.  Negative for abdominal pain, diarrhea, nausea and  vomiting.  Genitourinary: Negative.  Negative for dysuria and hematuria.  Musculoskeletal:  Positive for back pain and joint pain (Right knee pain).  Skin: Negative.  Negative for rash.  Neurological:  Negative for dizziness and headaches.  All other systems reviewed and are negative.  Today's Vitals   10/15/21 1045  BP: 126/74  Pulse: (!) 46  Temp: 98 F (36.7 C)  TempSrc: Oral  SpO2: 94%  Weight: 163 lb 8 oz (74.2 kg)  Height: 5' (1.524 m)   Body mass index is 31.93 kg/m.   Physical Exam Vitals reviewed.  Constitutional:      Appearance: Normal appearance.  HENT:     Head: Normocephalic.  Eyes:     Extraocular Movements: Extraocular movements intact.     Pupils: Pupils are equal, round, and reactive to light.  Cardiovascular:     Rate and Rhythm: Normal rate and regular rhythm.     Pulses: Normal pulses.     Heart sounds: Murmur (Systolic 3/6 aortic area) heard.  Pulmonary:     Effort: Pulmonary effort is normal.     Breath sounds: Normal breath sounds.  Musculoskeletal:     Cervical back: No tenderness.  Lymphadenopathy:     Cervical: No cervical adenopathy.  Skin:    General: Skin is warm and dry.     Capillary Refill: Capillary refill takes less than 2 seconds.  Neurological:     General: No focal deficit present.     Mental Status: She is alert and oriented to person, place, and time.  Psychiatric:        Mood and Affect: Mood normal.        Behavior: Behavior normal.      ASSESSMENT & PLAN: A total of 46 minutes was spent with the patient and counseling/coordination of care regarding preparing for this visit, review of most recent office visit notes, review of multiple chronic medical problems under management, review of all medications, management of chronic right knee pain, education on nutrition, prognosis, documentation and need for follow-up.  Problem List Items Addressed This Visit       Cardiovascular and Mediastinum   Coronary artery  disease    Stable.  No need of nitroglycerin use. Continues metoprolol tartrate 25 mg twice a day and daily baby aspirin.      Essential hypertension    Well-controlled hypertension. Continue ramipril 10 mg daily. BP Readings from Last 3 Encounters:  10/15/21 126/74  09/14/21 120/82  09/12/21 124/90           Digestive   Gastroesophageal reflux disease with esophagitis, Rx Protonix    Stable.  Presently taking pantoprazole 40 mg daily.        Musculoskeletal and Integument   Patellofemoral arthritis of right knee    Causing chronic right knee pain and affecting quality of life May take Tylenol with codeine as needed for severe pain. Has follow-up appointment scheduled with Ortho.      Relevant Medications   acetaminophen-codeine (TYLENOL #3) 300-30 MG tablet   Degenerative disc disease, lumbar    Creating chronic lumbar pain and affecting quality of life Has neurosurgical appointment next Wednesday      Relevant Medications   acetaminophen-codeine (TYLENOL #3) 300-30 MG tablet  Other   Hyperlipidemia    Continue Zetia 10 mg daily and Repatha every 2 weeks Diet and nutrition discussed.      Chronic pain of right knee - Primary    Active and affecting quality of life. May take Tylenol with codeine for moderate to severe pain as needed Has orthopedic evaluation scheduled for next Wednesday Will need MRI of right knee in the near future      Relevant Medications   acetaminophen-codeine (TYLENOL #3) 300-30 MG tablet   Patient Instructions  Health Maintenance After Age 44 After age 67, you are at a higher risk for certain long-term diseases and infections as well as injuries from falls. Falls are a major cause of broken bones and head injuries in people who are older than age 42. Getting regular preventive care can help to keep you healthy and well. Preventive care includes getting regular testing and making lifestyle changes as recommended by your health care  provider. Talk with your health care provider about: Which screenings and tests you should have. A screening is a test that checks for a disease when you have no symptoms. A diet and exercise plan that is right for you. What should I know about screenings and tests to prevent falls? Screening and testing are the best ways to find a health problem early. Early diagnosis and treatment give you the best chance of managing medical conditions that are common after age 15. Certain conditions and lifestyle choices may make you more likely to have a fall. Your health care provider may recommend: Regular vision checks. Poor vision and conditions such as cataracts can make you more likely to have a fall. If you wear glasses, make sure to get your prescription updated if your vision changes. Medicine review. Work with your health care provider to regularly review all of the medicines you are taking, including over-the-counter medicines. Ask your health care provider about any side effects that may make you more likely to have a fall. Tell your health care provider if any medicines that you take make you feel dizzy or sleepy. Strength and balance checks. Your health care provider may recommend certain tests to check your strength and balance while standing, walking, or changing positions. Foot health exam. Foot pain and numbness, as well as not wearing proper footwear, can make you more likely to have a fall. Screenings, including: Osteoporosis screening. Osteoporosis is a condition that causes the bones to get weaker and break more easily. Blood pressure screening. Blood pressure changes and medicines to control blood pressure can make you feel dizzy. Depression screening. You may be more likely to have a fall if you have a fear of falling, feel depressed, or feel unable to do activities that you used to do. Alcohol use screening. Using too much alcohol can affect your balance and may make you more likely to have  a fall. Follow these instructions at home: Lifestyle Do not drink alcohol if: Your health care provider tells you not to drink. If you drink alcohol: Limit how much you have to: 0-1 drink a day for women. 0-2 drinks a day for men. Know how much alcohol is in your drink. In the [place] one drink equals one 12 oz bottle of beer (355 mL), one 5 oz glass of wine (148 mL), or one 1 oz glass of hard liquor (44 mL). Do not use any products that contain nicotine or tobacco. These products include cigarettes, chewing tobacco, and vaping devices, such as e-cigarettes. If  you need help quitting, ask your health care provider. Activity  Follow a regular exercise program to stay fit. This will help you maintain your balance. Ask your health care provider what types of exercise are appropriate for you. If you need a cane or walker, use it as recommended by your health care provider. Wear supportive shoes that have nonskid soles. Safety  Remove any tripping hazards, such as rugs, cords, and clutter. Install safety equipment such as grab bars in bathrooms and safety rails on stairs. Keep rooms and walkways well-lit. General instructions Talk with your health care provider about your risks for falling. Tell your health care provider if: You fall. Be sure to tell your health care provider about all falls, even ones that seem minor. You feel dizzy, tiredness (fatigue), or off-balance. Take over-the-counter and prescription medicines only as told by your health care provider. These include supplements. Eat a healthy diet and maintain a healthy weight. A healthy diet includes low-fat dairy products, low-fat (lean) meats, and fiber from whole grains, beans, and lots of fruits and vegetables. Stay current with your vaccines. Schedule regular health, dental, and eye exams. Summary Having a healthy lifestyle and getting preventive care can help to protect your health and wellness after age 22. Screening and  testing are the best way to find a health problem early and help you avoid having a fall. Early diagnosis and treatment give you the best chance for managing medical conditions that are more common for people who are older than age 52. Falls are a major cause of broken bones and head injuries in people who are older than age 58. Take precautions to prevent a fall at home. Work with your health care provider to learn what changes you can make to improve your health and wellness and to prevent falls. This information is not intended to replace advice given to you by your health care provider. Make sure you discuss any questions you have with your health care provider. Document Revised: 06/26/2020 Document Reviewed: 06/26/2020 Elsevier Patient Education  [place], MD [facility]

## 2021-10-15 NOTE — Assessment & Plan Note (Addendum)
Stable.  No need of nitroglycerin use. Continues metoprolol tartrate 25 mg twice a day and daily baby aspirin.

## 2021-10-15 NOTE — Assessment & Plan Note (Signed)
Continue Zetia 10 mg daily and Repatha every 2 weeks Diet and nutrition discussed.

## 2021-10-15 NOTE — Assessment & Plan Note (Signed)
Causing chronic right knee pain and affecting quality of life May take Tylenol with codeine as needed for severe pain. Has follow-up appointment scheduled with Ortho.

## 2021-10-16 NOTE — Progress Notes (Unsigned)
Courtney [name] [address] [place] Phone: [phone] Subjective:   Courtney [name], am serving as a scribe for Dr. Hulan Saas.  I'm seeing this patient by the request  of:  [name], [name], MD  CC: knee pain   QJJ:[id number]  09/14/2021 Exacerbation of chronic problem.  Patient did have this 2 years ago.  Attempted injection today.  We will try a brace.  Home exercises.  Pain does seem to be out of proportion to the amount of palpation.  Will get a right hip x-ray as well to further evaluate to make sure there is no loosening of the replacement.  Differential does include a lumbar radiculopathy but recently did have an epidural which did help a lot of the thigh pain she was having but did not help the knee pain.  Patient will call us again after the weekend to tell us how she is doing.  If having worsening pain or unable to ambulate I do think we need to consider the possibility of an MRI.  We will follow-up again in 4 to 6 weeks  Updated 10/17/2021 Courtney [name] is a 71 y.o. female coming in with complaint of hip and knee pain. Hip is doing well. Knee isn't doing great. Walking and standing produces pain. Anterior sharp pain. Injection didn't help. Icing in the morning helps. Tylenol arthritis no NSAIDS and heat.  MRI IMPRESSION: 1. 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   2.  Small suprapatellar joint effusion.   3.  Small Baker's cyst measuring approximately 2.1 x 1.0 x 3.0 cm.   4. Benign osseous lesion in the lateral tibial plateau most consistent with enchondroma or intraosseous cyst.       Past Medical History:  Diagnosis Date   Aortic valve sclerosis    echo 81/8563, soft systolic murmur   Arthritis    in right hip   Back pain    Basal cell carcinoma 05/31/2016   bcc left forehead  tx exc   Carotid artery disease (HCC)    40-59% bilateral, doppler December 2010   Cataract     Constipation    Coronary artery disease    a. BMS-mid RCA 11/2007 b. stress echo 02/2009 subtle inferior HK, lateral ST depressions with stress c. follow-up cath 02/2009: RCA stent patent, severe but stable stenosis of distal posterior lateral branch RCA. LV normal, med tx unless more sx c. ETT Myoview 08/27/12: negative for ischemia; EF 64%   Decreased hearing    Depression    Mild, August, 2012   Dyslipidemia    Easy bruising    Ejection fraction    EF normal, catheterization, 2011 /  EF 55%, echo, 2010   Fluid overload    Mild, stable since hospitalization in the past   GERD (gastroesophageal reflux disease)    occarionally, takes Pepcid over the counter   Heart murmur    has, occasional PVC's   History of right hip replacement    Hyperlipidemia    Hypertension    Joint pain    Myocardial infarction ([place])    2009   Palpitations    PVC (premature ventricular contraction)    per prior Holter monitor   Right hip pain 12/2009   Shellfish allergy    Stress fracture    Left foot   Trouble in sleeping    Vitamin B12 deficiency    Vitamin D deficiency    Past Surgical History:  Procedure  Laterality Date   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/01/2005   BREAST BIOPSY Left    carcinoid tumor removal     colon-   CARDIAC CATHETERIZATION  1/211   Patent RCA stent, stenotic PLB supplying small distribution; medically managed   CATARACT EXTRACTION, BILATERAL     CORONARY ANGIOPLASTY WITH STENT PLACEMENT  11/2007   BMS-mid RCA   DIAGNOSTIC LAPAROSCOPY     1982 for endometriosis   DILATION AND CURETTAGE OF UTERUS  2005   SKIN CANCER EXCISION     TOTAL HIP ARTHROPLASTY Right 07/08/2014   Procedure: RIGHT TOTAL HIP ARTHROPLASTY ANTERIOR APPROACH;  Surgeon: Mcarthur Rossetti, MD;  Location: [facility];  Service: Orthopedics;  Laterality: Right;   WISDOM TOOTH EXTRACTION     Social History   Socioeconomic History   Marital status: Divorced    Spouse name: Not on file   Number of children: 1    Years of education: 97   Highest education level: Not on file  Occupational History   Occupation: Retired - Retail buyer  Tobacco Use   Smoking status: Never   Smokeless tobacco: Never  Vaping Use   Vaping Use: Never used  Substance and Sexual Activity   Alcohol use: Yes    Comment: 1 glass of wine 3x/week   Drug use: No   Sexual activity: Yes  Other Topics Concern   Not on file  Social History Narrative   Right handed   Soda- sometimes   Drinks herbal tea   Fun: Dance, garden,    Denies religious beliefs effecting health care.   Denies abuse and feels safe at home   Social Determinants of Health   Financial Resource Strain: Low Risk  (09/24/2021)   Overall Financial Resource Strain (CARDIA)    Difficulty of Paying Living Expenses: Not hard at all  Food Insecurity: No Food Insecurity (09/24/2021)   Hunger Vital Sign    Worried About Running Out of Food in the Last Year: Never true    Ran Out of Food in the Last Year: Never true  Transportation Needs: No Transportation Needs (09/24/2021)   PRAPARE - Hydrologist (Medical): No    Lack of Transportation (Non-Medical): No  Physical Activity: Inactive (09/24/2021)   Exercise Vital Sign    Days of Exercise per Week: 0 days    Minutes of Exercise per Session: 0 min  Stress: No Stress Concern Present (09/24/2021)   [place]    Feeling of Stress : Not at all  Social Connections: Moderately Isolated (09/24/2021)   Social Connection and Isolation Panel [NHANES]    Frequency of Communication with Friends and Family: Three times a week    Frequency of Social Gatherings with Friends and Family: Three times a week    Attends Religious Services: Never    Active Member of Clubs or Organizations: No    Attends Archivist Meetings: Never    Marital Status: Living with partner   Allergies  Allergen Reactions   Erythromycin Hives    Penicillins Hives    Has patient had a PCN reaction causing immediate rash, facial/tongue/throat swelling, SOB or lightheadedness with hypotension: No Has patient had a PCN reaction causing severe rash involving mucus membranes or skin necrosis: No Has patient had a PCN reaction that required hospitalization: No Has patient had a PCN reaction occurring within the last 10 years: No If all of the above answers are "NO", then  may proceed with Cephalosporin use.    Shellfish Allergy Hives   Family History  Problem Relation Age of Onset   Heart attack Father 2   Hyperlipidemia Father    Hypertension Father    Sudden death Father    Stroke Mother    Hyperlipidemia Mother    Hypertension Mother    Heart disease Mother    Obesity Mother    Heart attack Paternal Grandfather    Heart disease Sister    BRCA 1/2 Neg Hx    Breast cancer Neg Hx    Esophageal cancer Neg Hx    Colon cancer Neg Hx    Pancreatic cancer Neg Hx    Liver disease Neg Hx    Stomach cancer Neg Hx      Current Outpatient Medications (Cardiovascular):    Evolocumab (REPATHA SURECLICK) 704 MG/ML SOAJ, Inject 140 mg into the skin every 14 (fourteen) days.   ezetimibe (ZETIA) 10 MG tablet, Take 1 tablet (10 mg total) by mouth daily.   metoprolol tartrate (LOPRESSOR) 25 MG tablet, Take 0.5 tablets (12.5 mg total) by mouth 2 (two) times daily.   nitroGLYCERIN (NITROSTAT) 0.4 MG SL tablet, Place 1 tablet (0.4 mg total) under the tongue every 5 (five) minutes as needed for chest pain.   ramipril (ALTACE) 10 MG capsule, Take 1 capsule (10 mg total) by mouth daily.   rosuvastatin (CRESTOR) 40 MG tablet, Take 1 tablet (40 mg total) by mouth daily.   Current Outpatient Medications (Analgesics):    acetaminophen-codeine (TYLENOL #3) 300-30 MG tablet, Take 1-2 tablets by mouth every 6 (six) hours as needed for moderate pain.   aspirin 81 MG tablet, Take 81 mg by mouth daily.   meloxicam (MOBIC) 15 MG tablet, Take 1 tablet (15  mg total) by mouth daily.   Current Outpatient Medications (Other):    clobetasol cream (TEMOVATE) 0.05 %, Apply 1-2 times a day to poison [place] for 2 weeks   dicyclomine (BENTYL) 20 MG tablet, Take 1 tablet (20 mg total) by mouth every 6 (six) hours as needed for spasms.   gabapentin (NEURONTIN) 300 MG capsule, Take 1 capsule (300 mg total) by mouth at bedtime.   pantoprazole (PROTONIX) 40 MG tablet, Take 1 tablet (40 mg total) by mouth daily.   Vitamin D, Ergocalciferol, (DRISDOL) 1.25 MG (50000 UNIT) CAPS capsule, Take 1 capsule (50,000 Units total) by mouth every 7 (seven) days.   Reviewed prior external information including notes and imaging from  primary care provider As well as notes that were available from care everywhere and other healthcare systems.  Past medical history, social, surgical and family history all reviewed in electronic medical record.  No pertanent information unless stated regarding to the chief complaint.   Review of Systems:  No headache, visual changes, nausea, vomiting, diarrhea, constipation, dizziness, abdominal pain, skin rash, fevers, chills, night sweats, weight loss, swollen lymph nodes, body aches, joint swelling, chest pain, shortness of breath, mood changes. POSITIVE muscle aches  Objective  Blood pressure 110/64, pulse (!) 59, height 5' (1.524 m), weight 165 lb (74.8 kg), SpO2 94 %.   General: No apparent distress alert and oriented x3 mood and affect normal, dressed appropriately.  HEENT: Pupils equal, extraocular movements intact  Respiratory: Patient's speak in full sentences and does not appear short of breath  Cardiovascular: No lower extremity edema, non tender, no erythema  Antalgic gait  Knee exam shows lateral tracking of the patella crepitus noted. Pain with grind test, still TTP  over medial aspect of knee.  Pain is out of proportion.   Limited muscular skeletal ultrasound was performed and interpreted by Hulan Saas, M  Limited  ultrasound shows the patient does have hypoechoic changes still noted over the patellofemoral joint.  In addition to this patient does have fairly significant narrowing of the patellofemoral joint.  Meniscus appeared to have some degenerative changes but nothing with any acute displacement noted.  No cortical irregularities noted of the medial tibial area. Impression: Patellofemoral arthritis with trace effusion    Impression and Recommendations:    The above documentation has been reviewed and is accurate and complete Lyndal Pulley, DO

## 2021-10-17 ENCOUNTER — Ambulatory Visit: Payer: Medicare HMO | Admitting: Family Medicine

## 2021-10-17 DIAGNOSIS — Z6831 Body mass index (BMI) 31.0-31.9, adult: Secondary | ICD-10-CM | POA: Diagnosis not present

## 2021-10-17 DIAGNOSIS — M47816 Spondylosis without myelopathy or radiculopathy, lumbar region: Secondary | ICD-10-CM | POA: Diagnosis not present

## 2021-10-17 DIAGNOSIS — M1711 Unilateral primary osteoarthritis, right knee: Secondary | ICD-10-CM | POA: Diagnosis not present

## 2021-10-17 NOTE — Patient Instructions (Signed)
Read about PRP, but I will touch base with Dr. Ninfa Linden We will contact you when we know more

## 2021-10-17 NOTE — Assessment & Plan Note (Signed)
Patient's pain is out of proportion.  Has failed home exercises, icing regimen, as well as steroid injection.  We did discuss different treatment options including the possibility of viscosupplementation as well as PRP.  Due to patient's quality of life and unable to ambulate more than 200 feet at a time with worsening pain I do feel that patient could be a potential candidate for arthroscopic procedure.  Seems to be only in the patellofemoral area where most of the difficulties are and will refer to Dr. Ninfa Linden for further evaluation and treatment.  We did discuss with patient if they decided not to do surgery we can talk about the possible injections but at the moment I think improvement would be extremely slow for this individual.  Total time reviewing patient's MRI and discussing with patient as well friend in the room 31 minutes

## 2021-10-18 ENCOUNTER — Ambulatory Visit (INDEPENDENT_AMBULATORY_CARE_PROVIDER_SITE_OTHER): Payer: Medicare HMO | Admitting: Family Medicine

## 2021-10-18 ENCOUNTER — Encounter (INDEPENDENT_AMBULATORY_CARE_PROVIDER_SITE_OTHER): Payer: Self-pay | Admitting: Family Medicine

## 2021-10-18 VITALS — BP 131/64 | HR 51 | Temp 97.5°F | Ht 60.0 in | Wt 157.0 lb

## 2021-10-18 DIAGNOSIS — E782 Mixed hyperlipidemia: Secondary | ICD-10-CM | POA: Diagnosis not present

## 2021-10-18 DIAGNOSIS — Z6830 Body mass index (BMI) 30.0-30.9, adult: Secondary | ICD-10-CM | POA: Diagnosis not present

## 2021-10-18 DIAGNOSIS — E559 Vitamin D deficiency, unspecified: Secondary | ICD-10-CM

## 2021-10-18 DIAGNOSIS — E669 Obesity, unspecified: Secondary | ICD-10-CM

## 2021-10-18 DIAGNOSIS — R7303 Prediabetes: Secondary | ICD-10-CM

## 2021-10-18 MED ORDER — VITAMIN D (ERGOCALCIFEROL) 1.25 MG (50000 UNIT) PO CAPS: 50000.0000 [IU] | ORAL_CAPSULE | ORAL | 0 refills | Status: DC

## 2021-10-19 LAB — CMP14+EGFR
ALT: 19 IU/L (ref 0–32)
AST: 20 IU/L (ref 0–40)
Albumin/Globulin Ratio: 2.1 (ref 1.2–2.2)
Albumin: 4.5 g/dL (ref 3.8–4.8)
Alkaline Phosphatase: 62 IU/L (ref 44–121)
BUN/Creatinine Ratio: 25 (ref 12–28)
BUN: 22 mg/dL (ref 8–27)
Bilirubin Total: 0.4 mg/dL (ref 0.0–1.2)
CO2: 23 mmol/L (ref 20–29)
Calcium: 9.7 mg/dL (ref 8.7–10.3)
Chloride: 104 mmol/L (ref 96–106)
Creatinine, Ser: 0.87 mg/dL (ref 0.57–1.00)
Globulin, Total: 2.1 g/dL (ref 1.5–4.5)
Glucose: 91 mg/dL (ref 70–99)
Potassium: 4.9 mmol/L (ref 3.5–5.2)
Sodium: 140 mmol/L (ref 134–144)
Total Protein: 6.6 g/dL (ref 6.0–8.5)
eGFR: 71 mL/min/{1.73_m2} (ref >59)

## 2021-10-19 LAB — LIPID PANEL WITH LDL/HDL RATIO
Cholesterol, Total: 146 mg/dL (ref 100–199)
HDL: 88 mg/dL (ref >39)
LDL Chol Calc (NIH): 41 mg/dL (ref 0–99)
LDL/HDL Ratio: 0.5 ratio (ref 0.0–3.2)
Triglycerides: 95 mg/dL (ref 0–149)
VLDL Cholesterol Cal: 17 mg/dL (ref 5–40)

## 2021-10-19 LAB — HEMOGLOBIN A1C
Est. average glucose Bld gHb Est-mCnc: 120 mg/dL
Hgb A1c MFr Bld: 5.8 % — ABNORMAL HIGH (ref 4.8–5.6)

## 2021-10-19 LAB — VITAMIN D 25 HYDROXY (VIT D DEFICIENCY, FRACTURES): Vit D, 25-Hydroxy: 63.8 ng/mL (ref 30.0–100.0)

## 2021-10-19 LAB — INSULIN, RANDOM: INSULIN: 6.7 u[IU]/mL (ref 2.6–24.9)

## 2021-10-19 LAB — TSH: TSH: 1.59 u[IU]/mL (ref 0.450–4.500)

## 2021-10-23 ENCOUNTER — Encounter: Payer: Self-pay | Admitting: Family Medicine

## 2021-10-23 ENCOUNTER — Encounter (HOSPITAL_BASED_OUTPATIENT_CLINIC_OR_DEPARTMENT_OTHER): Payer: Self-pay | Admitting: Physical Therapy

## 2021-10-23 ENCOUNTER — Ambulatory Visit (HOSPITAL_BASED_OUTPATIENT_CLINIC_OR_DEPARTMENT_OTHER): Payer: Medicare HMO | Attending: Sports Medicine | Admitting: Physical Therapy

## 2021-10-23 DIAGNOSIS — M6281 Muscle weakness (generalized): Secondary | ICD-10-CM | POA: Diagnosis not present

## 2021-10-23 DIAGNOSIS — M25561 Pain in right knee: Secondary | ICD-10-CM | POA: Diagnosis not present

## 2021-10-23 DIAGNOSIS — G8929 Other chronic pain: Secondary | ICD-10-CM | POA: Diagnosis not present

## 2021-10-23 DIAGNOSIS — R262 Difficulty in walking, not elsewhere classified: Secondary | ICD-10-CM | POA: Diagnosis not present

## 2021-10-23 NOTE — Therapy (Signed)
OUTPATIENT PHYSICAL THERAPY THORACOLUMBAR TREATMENT   Patient Name: Courtney [name] MRN: [id number] DOB:1950/09/08, 71 y.o., female Today's Date: 10/23/2021   PT End of Session - 10/23/21 0926     Visit Number 3    Number of Visits 9    Date for PT Re-Evaluation 11/20/21    Authorization Type [employer] MCR    Progress Note Due on Visit 10    PT Start Time 0930    PT Stop Time 1016    PT Time Calculation (min) 46 min    Activity Tolerance Patient tolerated treatment well    Behavior During Therapy [facility] for tasks assessed/performed              Past Medical History:  Diagnosis Date   Aortic valve sclerosis    echo 24/2683, soft systolic murmur   Arthritis    in right hip   Back pain    Basal cell carcinoma 05/31/2016   bcc left forehead  tx exc   Carotid artery disease (HCC)    40-59% bilateral, doppler December 2010   Cataract    Constipation    Coronary artery disease    a. BMS-mid RCA 11/2007 b. stress echo 02/2009 subtle inferior HK, lateral ST depressions with stress c. follow-up cath 02/2009: RCA stent patent, severe but stable stenosis of distal posterior lateral branch RCA. LV normal, med tx unless more sx c. ETT Myoview 08/27/12: negative for ischemia; EF 64%   Decreased hearing    Depression    Mild, August, 2012   Dyslipidemia    Easy bruising    Ejection fraction    EF normal, catheterization, 2011 /  EF 55%, echo, 2010   Fluid overload    Mild, stable since hospitalization in the past   GERD (gastroesophageal reflux disease)    occarionally, takes Pepcid over the counter   Heart murmur    has, occasional PVC's   History of right hip replacement    Hyperlipidemia    Hypertension    Joint pain    Myocardial infarction ([place])    2009   Palpitations    PVC (premature ventricular contraction)    per prior Holter monitor   Right hip pain 12/2009   Shellfish allergy    Stress fracture    Left foot   Trouble in sleeping    Vitamin B12 deficiency     Vitamin D deficiency    Past Surgical History:  Procedure Laterality Date   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/01/2005   BREAST BIOPSY Left    carcinoid tumor removal     colon-   CARDIAC CATHETERIZATION  1/211   Patent RCA stent, stenotic PLB supplying small distribution; medically managed   CATARACT EXTRACTION, BILATERAL     CORONARY ANGIOPLASTY WITH STENT PLACEMENT  11/2007   BMS-mid RCA   DIAGNOSTIC LAPAROSCOPY     1982 for endometriosis   DILATION AND CURETTAGE OF UTERUS  2005   SKIN CANCER EXCISION     TOTAL HIP ARTHROPLASTY Right 07/08/2014   Procedure: RIGHT TOTAL HIP ARTHROPLASTY ANTERIOR APPROACH;  Surgeon: Mcarthur Rossetti, MD;  Location: [facility];  Service: Orthopedics;  Laterality: Right;   WISDOM TOOTH EXTRACTION     Patient Active Problem List   Diagnosis Date Noted   Chronic pain of right knee 10/15/2021   Stress 08/30/2021   Prediabetes 07/11/2021   Degenerative disc disease, lumbar 05/19/2020   Restless leg syndrome 05/15/2020   Degenerative disc disease, cervical 02/02/2020   Polyarthralgia 01/03/2020  Patellofemoral arthritis of right knee 06/16/2019   Vitamin D deficiency, Vit D = 43.2 (01/25/19), Rx vit D 50K IU every 2 weeks 08/04/2018   Cyst of right breast 05/07/2018   Chronic headache disorder 02/08/2018   Essential hypertension 11/17/2017   Insomnia 07/15/2017   Female stress incontinence 07/15/2017   Gastroesophageal reflux disease with esophagitis, Rx Protonix 02/25/2017   Osteopenia 06/27/2015   Osteoarthritis of right hip 07/08/2014   Carotid artery disease ([place])    Aortic valve sclerosis    Hyperlipidemia    Coronary artery disease     PCP: Mitchel Honour, MD  REFERRING PROVIDER: Glennon Mac, DO  REFERRING DIAG:  M54.16 (ICD-10-CM) - Lumbar radiculopathy  M79.604 (ICD-10-CM) - Right leg pain  M51.36 (ICD-10-CM) - DDD (degenerative disc disease), lumbar    Rationale for Evaluation and Treatment Rehabilitation  THERAPY DIAG:  Chronic pain  of right knee  Muscle weakness (generalized)  Difficulty in walking, not elsewhere classified  ONSET DATE: 09/01/21  SUBJECTIVE:                                                                                                                                                                                           SUBJECTIVE STATEMENT:  Consult into Dr Ninfa Linden to look at hip. Most of the time my knee is still at about a 10.   PERTINENT HISTORY:  Rt THA, H/o knee pain that got better, L4/5 disk  PAIN:  Are you having pain? Yes: NPRS scale: 8/10 Pain location: Rt knee- surrounds patella Pain description: sharp when walking, thigh is achey Aggravating factors: weight bearing Relieving factors: it is just fine in sitting    PRECAUTIONS: Fall  WEIGHT BEARING RESTRICTIONS No  FALLS:  Has patient fallen in last 6 months? 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   2.  Small suprapatellar joint effusion.   3.  Small Baker's cyst measuring approximately 2.1 x 1.0 x 3.0 cm.   4. Benign osseous lesion in the lateral tibial plateau most consistent with enchondroma or intraosseous cyst.   PATIENT SURVEYS:  FOTO 39   COGNITION:  Overall cognitive status: Within functional limits for tasks assessed     SENSATION: Tingling anterior thigh  PALPATION: End feels WFL without pain Lateral tracking of patella  LOWER EXTREMITY ROM:     Excellent passive ROM through hip and knee  LOWER EXTREMITY  MMT:    Active SLR available to 4" off of table actively, passively to 90  MMT Right eval Left eval Rt/Lt  Hip flexion 24.1 27.5   Hip extension     Hip abduction 17.2 14.3   Hip adduction     Hip internal  rotation     Hip external rotation     Knee flexion     Knee extension 46.3 42.6   Ankle dorsiflexion 29.2 28.9   Ankle plantarflexion     Ankle inversion     Ankle eversion      (Blank rows = not tested)   GAIT: Antalgic on Rt, notbale IR of Rt LE    TODAY'S TREATMENT   9/5 Adapted for pool: hamstring stretch, hip flexion/ext, hip abd/add, fwd lunge step MANUAL: roller to Rt quads, 2-layer heel lift in Lt shoe; in prone: shifted into neutral alignment- Lt upper quadrant sacral PA hold, pelvis Rt to Lt shift Lumbar shift correction: Lt shoulder to wall, Lt hip to touch wall x10 with 1 breath hold  8/16  Nustep L3 x6 minutes  Bridges x10 Supine clams with red TB 1x10  2 second holds Bridge + clam x10 red TB 2 second holds Walking bridges 1x8  PPT 1x10 with 3 second holds  PPT 1x10 B with march PPT 1x10 B with low grade SLR  R piriformis stretch 2x30 seconds B L HS and piriformis stretches 2x30 seconds each  Sidelying clams red TB 1x10 B Prone hip extensions 1x10 B red TB             Hip hikes 1x10 B     PATIENT EDUCATION:  Education details: Geophysicist/field seismologist of condition, POC, HEP, exercise form/rationale Person educated: Patient Education method: Explanation, Demonstration, Tactile cues, Verbal cues, and Handouts Education comprehension: verbalized understanding, returned demonstration, verbal cues required, tactile cues required, and needs further education   HOME EXERCISE PROGRAM: [id number]  ASSESSMENT:  CLINICAL IMPRESSION:  Lumbar shift to the Left- concordant pain in knee created with correction of too much motion. Asked her to do this when her knee is hurting. Did end up removing heel lift to determine need after shift correction.    OBJECTIVE IMPAIRMENTS Abnormal gait, decreased activity tolerance, decreased balance, decreased endurance, decreased mobility, difficulty walking, decreased strength, increased muscle spasms, postural dysfunction, and pain.    ACTIVITY LIMITATIONS lifting, bending, standing, squatting, stairs, transfers, bathing, and locomotion level  PARTICIPATION LIMITATIONS: meal prep, cleaning, laundry, driving, shopping, and community activity  PERSONAL FACTORS 1-2 comorbidities: multiple treatments for back pain, h/o Rt THA  are also affecting patient's functional outcome.   REHAB POTENTIAL: Good  CLINICAL DECISION MAKING: Evolving/moderate complexity  EVALUATION COMPLEXITY: Moderate   GOALS: Goals reviewed with patient? Yes  SHORT TERM GOALS: Target date: 10/16/21  Able to demo core control with pelvic strengthening activities Baseline: Goal status: INITIAL   LONG TERM GOALS: Target date:11/20/21  Pt will meet FOTO goal Baseline:  Goal status: INITIAL  2.  Able to demonstrate hip abd strength via dynamometry to meet age-appropriate functional level Baseline:  Goal status: INITIAL  3.  Able to walk through grocery store before feeling limited by knee pain Baseline:  Goal status: INITIAL  4.  Able to demo SLR to at least 75 deg Baseline:  Goal status: INITIAL   PLAN: PT FREQUENCY: 1x/week  PT DURATION: 8 weeks  PLANNED INTERVENTIONS: Therapeutic exercises, Therapeutic activity, Neuromuscular re-education, Balance training, Gait training, Patient/Family education, Self Care, Joint mobilization, Stair training, Aquatic  Therapy, Dry Needling, Electrical stimulation, Spinal mobilization, Cryotherapy, Moist heat, Taping, and Manual therapy.  PLAN FOR NEXT SESSION: progress lumbopelvic strength as tolerated Brodi Kari C. Aundra Pung PT, DPT 10/23/21 12:08 PM   Referring diagnosis?  M54.16 (ICD-10-CM) - Lumbar radiculopathy  M79.604 (ICD-10-CM) - Right leg pain  M51.36 (ICD-10-CM) - DDD (degenerative disc disease), lumbar   Treatment diagnosis? (if different than referring diagnosis) m25.561  m62.81  r26.2 What was this (referring dx) caused by? '[]'$  Surgery '[]'$  Fall '[x]'$  Ongoing issue '[]'$   Arthritis '[]'$  Other: ____________  Laterality: '[x]'$  Rt '[]'$  Lt '[]'$  Both  Check all possible CPT codes:  *CHOOSE 10 OR LESS*    '[]'$  97110 (Therapeutic Exercise)  '[]'$  92507 (SLP Treatment)  '[]'$  97112 (Neuro Re-ed)   '[]'$  92526 (Swallowing Treatment)   '[]'$  97116 (Gait Training)   '[]'$  [id number] (Cognitive Training, 1st 15 minutes) '[]'$  97140 (Manual Therapy)   '[]'$  97130 (Cognitive Training, each add'l 15 minutes)  '[]'$  97164 (Re-evaluation)                              '[]'$  Other, List CPT Code ____________  '[]'$  97530 (Therapeutic Activities)     '[]'$  97535 (Self Care)   '[x]'$  All codes above (97110 - 97535)  '[]'$  97012 (Mechanical Traction)  '[]'$  97014 (E-stim Unattended)  '[]'$  97032 (E-stim manual)  '[]'$  97033 (Ionto)  '[]'$  25366 (Ultrasound) '[x]'$  97750 (Physical Performance Training) '[x]'$  [id number] (Aquatic Therapy) '[]'$  97016 (Vasopneumatic Device) '[]'$  [id number] (Paraffin) '[]'$  97034 (Contrast Bath) '[]'$  97597 (Wound Care 1st 20 sq cm) '[]'$  97598 (Wound Care each add'l 20 sq cm) '[]'$  97760 (Orthotic Fabrication, Fitting, Training Initial) '[]'$  [id number] (Prosthetic Management and Training Initial) '[]'$  [id number] (Orthotic or Prosthetic Training/ Modification Subsequent)

## 2021-10-24 NOTE — Progress Notes (Signed)
Chief Complaint:   OBESITY Courtney [name] is here to discuss her progress with her obesity treatment plan along with follow-up of her obesity related diagnoses. Shaguana is on the Category 1 Plan and keeping a food journal and adhering to recommended goals of 300-400 calories and 35+ grams of protein at lunch daily and states she is following her eating plan approximately 85% of the time. Freya states she is doing 0 minutes 0 times per week.  Today's visit was #: 57 Starting weight: 157 lbs Starting date: 06/16/2017 Today's weight: 157 lbs Today's date: 10/18/2021 Total lbs lost to date: 0 Total lbs lost since last in-office visit: 0  Interim History: Anahy has had a lot of medical issues in the last month. She has also been on vacation and she has tried to increase her water. She is eating carrots instead of chips. She is working on increasing her protein when possible.   Subjective:   1. Vitamin D deficiency Erendira's recent Vitamin D level is at goal. She denies nausea, vomiting, or muscle weakness.   2. Prediabetes Aaren's last A1c was 5.7. She is working on decreasing simple carbohydrates and increasing vegetables.   3. Mixed hyperlipidemia Spirit is on Repatha, Zetia, and Crestor, and she is working on her diet.  Assessment/Plan:   1. Vitamin D deficiency We will check labs today, and we will refill prescription Vitamin D for 90 days. Dawnya will follow-up for routine testing of Vitamin D, at least 2-3 times per year to avoid over-replacement.  - VITAMIN D 25 Hydroxy (Vit-D Deficiency, Fractures) - Vitamin D, Ergocalciferol, (DRISDOL) 1.25 MG (50000 UNIT) CAPS capsule; Take 1 capsule (50,000 Units total) by mouth every 7 (seven) days.  Dispense: 12 capsule; Refill: 0  2. Prediabetes We will check labs today. Kynzlie will continue to work on diet, exercise, and decreasing simple carbohydrates to help decrease the risk of diabetes.   - CMP14+EGFR - Insulin, random - Hemoglobin A1c -  TSH  3. Mixed hyperlipidemia We will check labs today. Efrat will continue to work on diet, exercise and weight loss efforts. Orders and follow up as documented in patient record.   - Lipid Panel With LDL/HDL Ratio  4. Obesity, Current BMI 30.7 Kataleyah is currently in the action stage of change. As such, her goal is to continue with weight loss efforts. She has agreed to the Category 1 Plan.   Behavioral modification strategies: increasing lean protein intake.  Ieshia has agreed to follow-up with our clinic in 4 weeks. She was informed of the importance of frequent follow-up visits to maximize her success with intensive lifestyle modifications for her multiple health conditions.   Objective:   Blood pressure 131/64, pulse (!) 51, temperature (!) 97.5 F (36.4 C), temperature source Oral, height 5' (1.524 m), weight 157 lb (71.2 kg), SpO2 98 %. Body mass index is 30.66 kg/m.  General: Cooperative, alert, well developed, in no acute distress. HEENT: Conjunctivae and lids unremarkable. Cardiovascular: Regular rhythm.  Lungs: Normal work of breathing. Neurologic: No focal deficits.   Lab Results  Component Value Date   CREATININE 0.87 10/18/2021   BUN 22 10/18/2021   NA 140 10/18/2021   K 4.9 10/18/2021   CL 104 10/18/2021   CO2 23 10/18/2021   Lab Results  Component Value Date   ALT 19 10/18/2021   AST 20 10/18/2021   ALKPHOS 62 10/18/2021   BILITOT 0.4 10/18/2021   Lab Results  Component Value Date   HGBA1C 5.8 (  H) 10/18/2021   HGBA1C 5.7 (H) 07/11/2021   HGBA1C 6.0 (H) 03/20/2021   HGBA1C 5.8 (H) 08/30/2020   HGBA1C 5.8 (H) 03/30/2020   Lab Results  Component Value Date   INSULIN 6.7 10/18/2021   INSULIN 9.0 07/11/2021   INSULIN 8.2 03/20/2021   INSULIN 6.8 08/30/2020   INSULIN 9.4 03/30/2020   Lab Results  Component Value Date   TSH 1.590 10/18/2021   Lab Results  Component Value Date   CHOL 146 10/18/2021   HDL 88 10/18/2021   LDLCALC 41 10/18/2021    TRIG 95 10/18/2021   CHOLHDL 1.6 05/15/2021   Lab Results  Component Value Date   VD25OH 63.8 10/18/2021   VD25OH 63.1 07/11/2021   VD25OH 79.4 03/20/2021   Lab Results  Component Value Date   WBC 7.3 01/03/2020   HGB 13.0 01/03/2020   HCT 37.7 01/03/2020   MCV 91.5 01/03/2020   PLT 196.0 01/03/2020   Lab Results  Component Value Date   IRON 117 10/17/2020   TIBC 431.2 10/17/2020   FERRITIN 133.2 10/17/2020    Obesity Behavioral Intervention:   Approximately 15 minutes were spent on the discussion below.  ASK: We discussed the diagnosis of obesity with [name] today and Makaylyn agreed to give [place] permission to discuss obesity behavioral modification therapy today.  ASSESS: Vonita has the diagnosis of obesity and her BMI today is 30.7. Caila is in the action stage of change.   ADVISE: Mazal was educated on the multiple health risks of obesity as well as the benefit of weight loss to improve her health. She was advised of the need for long term treatment and the importance of lifestyle modifications to improve her current health and to decrease her risk of future health problems.  AGREE: Multiple dietary modification options and treatment options were discussed and Keneisha agreed to follow the recommendations documented in the above note.  ARRANGE: Vasilia was educated on the importance of frequent visits to treat obesity as outlined per CMS and USPSTF guidelines and agreed to schedule her next follow up appointment today.  Attestation Statements:   Reviewed by clinician on day of visit: allergies, medications, problem list, medical history, surgical history, family history, social history, and previous encounter notes.   I, Trixie Dredge, am acting as transcriptionist for Dennard Nip, MD.  I have reviewed the above documentation for accuracy and completeness, and I agree with the above. -  Dennard Nip, MD

## 2021-10-26 ENCOUNTER — Telehealth: Payer: Self-pay | Admitting: Family Medicine

## 2021-10-26 NOTE — Telephone Encounter (Signed)
Patient called and said that she has not heard from Dr Rush Farmer and she can't wait to see him due to the pain. She is schedule for PRP on Monday. Just FYI.

## 2021-10-28 NOTE — Progress Notes (Unsigned)
[name] [address] [place] Phone: [phone] Subjective:   Courtney [name], am serving as a scribe for Dr. Hulan Saas.  I'm seeing this patient by the request  of:  [name], Ines Bloomer, MD  CC: knee pain follow up   DTO:[id number]  Courtney [name] is a 71 y.o. female coming in with complaint of knee pain. Here for PRP.      Past Medical History:  Diagnosis Date   Aortic valve sclerosis    echo 83/3825, soft systolic murmur   Arthritis    in right hip   Back pain    Basal cell carcinoma 05/31/2016   bcc left forehead  tx exc   Carotid artery disease (HCC)    40-59% bilateral, doppler December 2010   Cataract    Constipation    Coronary artery disease    a. BMS-mid RCA 11/2007 b. stress echo 02/2009 subtle inferior HK, lateral ST depressions with stress c. follow-up cath 02/2009: RCA stent patent, severe but stable stenosis of distal posterior lateral branch RCA. LV normal, med tx unless more sx c. ETT Myoview 08/27/12: negative for ischemia; EF 64%   Decreased hearing    Depression    Mild, August, 2012   Dyslipidemia    Easy bruising    Ejection fraction    EF normal, catheterization, 2011 /  EF 55%, echo, 2010   Fluid overload    Mild, stable since hospitalization in the past   GERD (gastroesophageal reflux disease)    occarionally, takes Pepcid over the counter   Heart murmur    has, occasional PVC's   History of right hip replacement    Hyperlipidemia    Hypertension    Joint pain    Myocardial infarction ([place])    2009   Palpitations    PVC (premature ventricular contraction)    per prior Holter monitor   Right hip pain 12/2009   Shellfish allergy    Stress fracture    Left foot   Trouble in sleeping    Vitamin B12 deficiency    Vitamin D deficiency    Past Surgical History:  Procedure Laterality Date   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/01/2005   BREAST BIOPSY Left    carcinoid tumor removal     colon-    CARDIAC CATHETERIZATION  1/211   Patent RCA stent, stenotic PLB supplying small distribution; medically managed   CATARACT EXTRACTION, BILATERAL     CORONARY ANGIOPLASTY WITH STENT PLACEMENT  11/2007   BMS-mid RCA   DIAGNOSTIC LAPAROSCOPY     1982 for endometriosis   DILATION AND CURETTAGE OF UTERUS  2005   SKIN CANCER EXCISION     TOTAL HIP ARTHROPLASTY Right 07/08/2014   Procedure: RIGHT TOTAL HIP ARTHROPLASTY ANTERIOR APPROACH;  Surgeon: Mcarthur Rossetti, MD;  Location: [facility];  Service: Orthopedics;  Laterality: Right;   WISDOM TOOTH EXTRACTION      Objective  Height 5' (1.524 m).   General: No apparent distress alert and oriented x3 mood and affect normal, dressed appropriately.  HEENT: Pupils equal, extraocular movements intact  Respiratory: Patient's speak in full sentences and does not appear short of breath  After informed written and verbal consent, patient was seated on exam table. Right knee was prepped with alcohol swab and utilizing anterolateral approach, patient's right knee space was injected with 2 cc of 0.5% Marcaine and then injected 6 cc of PRP. Patient tolerated the procedure well without immediate  complications.    Impression and Recommendations:     The above documentation has been reviewed and is accurate and complete Lyndal Pulley, DO

## 2021-10-29 ENCOUNTER — Ambulatory Visit (INDEPENDENT_AMBULATORY_CARE_PROVIDER_SITE_OTHER): Payer: Self-pay | Admitting: Family Medicine

## 2021-10-29 DIAGNOSIS — M1711 Unilateral primary osteoarthritis, right knee: Secondary | ICD-10-CM

## 2021-10-29 NOTE — Patient Instructions (Addendum)
No ice or ibuprofen for 3 days Use Heat instead We will send exercises tomorrow

## 2021-10-29 NOTE — Assessment & Plan Note (Signed)
Patient did have PRP today.  Hopefully will respond well to this.  Patient wants to avoid any surgical intervention at the moment.  If necessary will consider surgery and has seen Dr. Ninfa Linden previously.  Follow-up with me again 6 weeks otherwise.

## 2021-10-31 ENCOUNTER — Encounter (HOSPITAL_BASED_OUTPATIENT_CLINIC_OR_DEPARTMENT_OTHER): Payer: Medicare HMO | Admitting: Physical Therapy

## 2021-10-31 DIAGNOSIS — F4322 Adjustment disorder with anxiety: Secondary | ICD-10-CM | POA: Diagnosis not present

## 2021-11-06 ENCOUNTER — Ambulatory Visit (HOSPITAL_BASED_OUTPATIENT_CLINIC_OR_DEPARTMENT_OTHER): Payer: Medicare HMO | Admitting: Physical Therapy

## 2021-11-06 ENCOUNTER — Encounter (HOSPITAL_BASED_OUTPATIENT_CLINIC_OR_DEPARTMENT_OTHER): Payer: Self-pay | Admitting: Physical Therapy

## 2021-11-06 DIAGNOSIS — G8929 Other chronic pain: Secondary | ICD-10-CM | POA: Diagnosis not present

## 2021-11-06 DIAGNOSIS — M25561 Pain in right knee: Secondary | ICD-10-CM | POA: Diagnosis not present

## 2021-11-06 DIAGNOSIS — M6281 Muscle weakness (generalized): Secondary | ICD-10-CM

## 2021-11-06 DIAGNOSIS — R262 Difficulty in walking, not elsewhere classified: Secondary | ICD-10-CM | POA: Diagnosis not present

## 2021-11-06 NOTE — Therapy (Signed)
OUTPATIENT PHYSICAL THERAPY THORACOLUMBAR TREATMENT   Patient Name: Courtney [name] MRN: [id number] DOB:23-May-1950, 71 y.o., female Today's Date: 11/06/2021   PT End of Session - 11/06/21 0930     Visit Number 4    Number of Visits 9    Date for PT Re-Evaluation 11/20/21    Authorization Type [employer] MCR    Progress Note Due on Visit 10    PT Start Time 0930    PT Stop Time 1010    PT Time Calculation (min) 40 min    Activity Tolerance Patient tolerated treatment well    Behavior During Therapy [facility] for tasks assessed/performed              Past Medical History:  Diagnosis Date   Aortic valve sclerosis    echo 53/6644, soft systolic murmur   Arthritis    in right hip   Back pain    Basal cell carcinoma 05/31/2016   bcc left forehead  tx exc   Carotid artery disease (HCC)    40-59% bilateral, doppler December 2010   Cataract    Constipation    Coronary artery disease    a. BMS-mid RCA 11/2007 b. stress echo 02/2009 subtle inferior HK, lateral ST depressions with stress c. follow-up cath 02/2009: RCA stent patent, severe but stable stenosis of distal posterior lateral branch RCA. LV normal, med tx unless more sx c. ETT Myoview 08/27/12: negative for ischemia; EF 64%   Decreased hearing    Depression    Mild, August, 2012   Dyslipidemia    Easy bruising    Ejection fraction    EF normal, catheterization, 2011 /  EF 55%, echo, 2010   Fluid overload    Mild, stable since hospitalization in the past   GERD (gastroesophageal reflux disease)    occarionally, takes Pepcid over the counter   Heart murmur    has, occasional PVC's   History of right hip replacement    Hyperlipidemia    Hypertension    Joint pain    Myocardial infarction ([place])    2009   Palpitations    PVC (premature ventricular contraction)    per prior Holter monitor   Right hip pain 12/2009   Shellfish allergy    Stress fracture    Left foot   Trouble in sleeping    Vitamin B12 deficiency     Vitamin D deficiency    Past Surgical History:  Procedure Laterality Date   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/01/2005   BREAST BIOPSY Left    carcinoid tumor removal     colon-   CARDIAC CATHETERIZATION  1/211   Patent RCA stent, stenotic PLB supplying small distribution; medically managed   CATARACT EXTRACTION, BILATERAL     CORONARY ANGIOPLASTY WITH STENT PLACEMENT  11/2007   BMS-mid RCA   DIAGNOSTIC LAPAROSCOPY     1982 for endometriosis   DILATION AND CURETTAGE OF UTERUS  2005   SKIN CANCER EXCISION     TOTAL HIP ARTHROPLASTY Right 07/08/2014   Procedure: RIGHT TOTAL HIP ARTHROPLASTY ANTERIOR APPROACH;  Surgeon: Mcarthur Rossetti, MD;  Location: [facility];  Service: Orthopedics;  Laterality: Right;   WISDOM TOOTH EXTRACTION     Patient Active Problem List   Diagnosis Date Noted   Chronic pain of right knee 10/15/2021   Stress 08/30/2021   Prediabetes 07/11/2021   Degenerative disc disease, lumbar 05/19/2020   Restless leg syndrome 05/15/2020   Degenerative disc disease, cervical 02/02/2020   Polyarthralgia 01/03/2020  Patellofemoral arthritis of right knee 06/16/2019   Vitamin D deficiency, Vit D = 43.2 (01/25/19), Rx vit D 50K IU every 2 weeks 08/04/2018   Cyst of right breast 05/07/2018   Chronic headache disorder 02/08/2018   Essential hypertension 11/17/2017   Insomnia 07/15/2017   Female stress incontinence 07/15/2017   Gastroesophageal reflux disease with esophagitis, Rx Protonix 02/25/2017   Osteopenia 06/27/2015   Osteoarthritis of right hip 07/08/2014   Carotid artery disease ([place])    Aortic valve sclerosis    Hyperlipidemia    Coronary artery disease     PCP: Mitchel Honour, MD  REFERRING PROVIDER: Glennon Mac, DO  REFERRING DIAG:  M54.16 (ICD-10-CM) - Lumbar radiculopathy  M79.604 (ICD-10-CM) - Right leg pain  M51.36 (ICD-10-CM) - DDD (degenerative disc disease), lumbar    Rationale for Evaluation and Treatment Rehabilitation  THERAPY DIAG:  Chronic pain  of right knee  Muscle weakness (generalized)  Difficulty in walking, not elsewhere classified  ONSET DATE: 09/01/21  SUBJECTIVE:                                                                                                                                                                                           SUBJECTIVE STATEMENT:  Felt like the shift correction was helping knee but then I had the PRP injection and now I cannot tolerate it. Injection was last Monday (9/11).   PERTINENT HISTORY:  Rt THA, H/o knee pain that got better, L4/5 disk  PAIN:  Are you having pain? Yes: NPRS scale: 8/10 Pain location: Rt knee- surrounds patella Pain description: sharp when walking, thigh is achey Aggravating factors: weight bearing Relieving factors: it is just fine in sitting    PRECAUTIONS: Fall  WEIGHT BEARING RESTRICTIONS No  FALLS:  Has patient fallen in last 6 months? 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   2.  Small suprapatellar joint effusion.   3.  Small Baker's cyst measuring approximately 2.1 x 1.0 x 3.0 cm.   4. Benign osseous lesion in the lateral tibial plateau most consistent with enchondroma or intraosseous cyst.   PATIENT SURVEYS:  FOTO 39   COGNITION:  Overall cognitive status: Within functional limits for tasks assessed     SENSATION: Tingling anterior thigh  PALPATION: End feels WFL without pain Lateral tracking of patella  LOWER EXTREMITY ROM:     Excellent passive ROM through  hip and knee  LOWER EXTREMITY MMT:    Active SLR available to 4" off of table actively, passively to 90  MMT Right eval Left eval Rt/Lt  Hip flexion 24.1 27.5   Hip extension     Hip  abduction 17.2 14.3   Hip adduction     Hip internal rotation     Hip external rotation     Knee flexion     Knee extension 46.3 42.6   Ankle dorsiflexion 29.2 28.9   Ankle plantarflexion     Ankle inversion     Ankle eversion      (Blank rows = not tested)   GAIT: Antalgic on Rt, notbale IR of Rt LE    TODAY'S TREATMENT   9/18 Nustep 5 min L5 Prone quad stretch with strap Hooklying ab set + add with ball Supine quad set with towel roll- cues for ab set; added mini SLR Lumbar shift correction- Rt sidelying over 2 pillows Roller to Rt quads in mod thomas test position Hesch self correction for Lt post innom  9/5 Adapted for pool: hamstring stretch, hip flexion/ext, hip abd/add, fwd lunge step MANUAL: roller to Rt quads, 2-layer heel lift in Lt shoe; in prone: shifted into neutral alignment- Lt upper quadrant sacral PA hold, pelvis Rt to Lt shift Lumbar shift correction: Lt shoulder to wall, Lt hip to touch wall x10 with 1 breath hold  8/16  Nustep L3 x6 minutes  Bridges x10 Supine clams with red TB 1x10  2 second holds Bridge + clam x10 red TB 2 second holds Walking bridges 1x8  PPT 1x10 with 3 second holds  PPT 1x10 B with march PPT 1x10 B with low grade SLR  R piriformis stretch 2x30 seconds B L HS and piriformis stretches 2x30 seconds each  Sidelying clams red TB 1x10 B Prone hip extensions 1x10 B red TB             Hip hikes 1x10 B     PATIENT EDUCATION:  Education details: Geophysicist/field seismologist of condition, POC, HEP, exercise form/rationale Person educated: Patient Education method: Explanation, Demonstration, Tactile cues, Verbal cues, and Handouts Education comprehension: verbalized understanding, returned demonstration, verbal cues required, tactile cues required, and needs further education   HOME EXERCISE PROGRAM: [id number]  ASSESSMENT:  CLINICAL IMPRESSION:  Able to perform exercises today without increased pain- reviewed exercises provided to her by  MD. Modified shift to sidelying until she can tolerate standing again.    OBJECTIVE IMPAIRMENTS Abnormal gait, decreased activity tolerance, decreased balance, decreased endurance, decreased mobility, difficulty walking, decreased strength, increased muscle spasms, postural dysfunction, and pain.   ACTIVITY LIMITATIONS lifting, bending, standing, squatting, stairs, transfers, bathing, and locomotion level  PARTICIPATION LIMITATIONS: meal prep, cleaning, laundry, driving, shopping, and community activity  PERSONAL FACTORS 1-2 comorbidities: multiple treatments for back pain, h/o Rt THA  are also affecting patient's functional outcome.   REHAB POTENTIAL: Good  CLINICAL DECISION MAKING: Evolving/moderate complexity  EVALUATION COMPLEXITY: Moderate   GOALS: Goals reviewed with patient? Yes  SHORT TERM GOALS: Target date: 10/16/21  Able to demo core control with pelvic strengthening activities Baseline: Goal status: achieved   LONG TERM GOALS: Target date:11/20/21  Pt will meet FOTO goal Baseline:  Goal status: INITIAL  2.  Able to demonstrate hip abd strength via dynamometry to meet age-appropriate functional level Baseline:  Goal status: INITIAL  3.  Able to walk through grocery store before feeling limited by knee pain Baseline:  Goal status: INITIAL  4.  Able to demo SLR to at least 75 deg Baseline:  Goal status: INITIAL   PLAN: PT FREQUENCY: 1x/week  PT DURATION: 8 weeks  PLANNED INTERVENTIONS: Therapeutic exercises, Therapeutic activity, Neuromuscular re-education, Balance training, Gait training, Patient/Family education, Self Care, Joint mobilization, Stair training, Aquatic Therapy, Dry Needling, Electrical stimulation, Spinal mobilization, Cryotherapy, Moist heat, Taping, and Manual therapy.  PLAN FOR NEXT SESSION: progress lumbopelvic strength as tolerated Rashad Auld C. Kinsler Soeder PT, DPT 11/06/21 10:12 AM   Referring diagnosis?  M54.16 (ICD-10-CM) - Lumbar  radiculopathy  M79.604 (ICD-10-CM) - Right leg pain  M51.36 (ICD-10-CM) - DDD (degenerative disc disease), lumbar   Treatment diagnosis? (if different than referring diagnosis) m25.561  m62.81  r26.2 What was this (referring dx) caused by? '[]'$  Surgery '[]'$  Fall '[x]'$  Ongoing issue '[]'$  Arthritis '[]'$  Other: ____________  Laterality: '[x]'$  Rt '[]'$  Lt '[]'$  Both  Check all possible CPT codes:  *CHOOSE 10 OR LESS*    '[]'$  97110 (Therapeutic Exercise)  '[]'$  92507 (SLP Treatment)  '[]'$  97112 (Neuro Re-ed)   '[]'$  92526 (Swallowing Treatment)   '[]'$  97116 (Gait Training)   '[]'$  [id number] (Cognitive Training, 1st 15 minutes) '[]'$  97140 (Manual Therapy)   '[]'$  97130 (Cognitive Training, each add'l 15 minutes)  '[]'$  97164 (Re-evaluation)                              '[]'$  Other, List CPT Code ____________  '[]'$  53664 (Therapeutic Activities)     '[]'$  97535 (Self Care)   '[x]'$  All codes above (97110 - 97535)  '[]'$  97012 (Mechanical Traction)  '[]'$  97014 (E-stim Unattended)  '[]'$  97032 (E-stim manual)  '[]'$  97033 (Ionto)  '[]'$  97035 (Ultrasound) '[x]'$  97750 (Physical Performance Training) '[x]'$  [id number] (Aquatic Therapy) '[]'$  97016 (Vasopneumatic Device) '[]'$  [id number] (Paraffin) '[]'$  97034 (Contrast Bath) '[]'$  97597 (Wound Care 1st 20 sq cm) '[]'$  97598 (Wound Care each add'l 20 sq cm) '[]'$  97760 (Orthotic Fabrication, Fitting, Training Initial) '[]'$  [id number] (Prosthetic Management and Training Initial) '[]'$  [id number] (Orthotic or Prosthetic Training/ Modification Subsequent)

## 2021-11-08 ENCOUNTER — Encounter: Payer: Self-pay | Admitting: Emergency Medicine

## 2021-11-08 DIAGNOSIS — G8929 Other chronic pain: Secondary | ICD-10-CM

## 2021-11-08 NOTE — Telephone Encounter (Signed)
Okay to place referral as requested.  Thanks

## 2021-11-09 ENCOUNTER — Ambulatory Visit (INDEPENDENT_AMBULATORY_CARE_PROVIDER_SITE_OTHER): Payer: Medicare HMO | Admitting: *Deleted

## 2021-11-09 DIAGNOSIS — Z23 Encounter for immunization: Secondary | ICD-10-CM | POA: Diagnosis not present

## 2021-11-13 ENCOUNTER — Encounter (HOSPITAL_BASED_OUTPATIENT_CLINIC_OR_DEPARTMENT_OTHER): Payer: Self-pay | Admitting: Physical Therapy

## 2021-11-13 ENCOUNTER — Ambulatory Visit (HOSPITAL_BASED_OUTPATIENT_CLINIC_OR_DEPARTMENT_OTHER): Payer: Medicare HMO | Admitting: Physical Therapy

## 2021-11-13 DIAGNOSIS — G8929 Other chronic pain: Secondary | ICD-10-CM | POA: Diagnosis not present

## 2021-11-13 DIAGNOSIS — M6281 Muscle weakness (generalized): Secondary | ICD-10-CM | POA: Diagnosis not present

## 2021-11-13 DIAGNOSIS — M25561 Pain in right knee: Secondary | ICD-10-CM | POA: Diagnosis not present

## 2021-11-13 DIAGNOSIS — R262 Difficulty in walking, not elsewhere classified: Secondary | ICD-10-CM

## 2021-11-13 NOTE — Therapy (Signed)
OUTPATIENT PHYSICAL THERAPY THORACOLUMBAR TREATMENT   Patient Name: Courtney [name] MRN: [id number] DOB:08/21/1950, 71 y.o., female Today's Date: 11/13/2021   PT End of Session - 11/13/21 0936     Visit Number 5    Number of Visits 9    Date for PT Re-Evaluation 11/20/21    Authorization Type [employer] MCR    Progress Note Due on Visit 10    PT Start Time 0350    PT Stop Time 0927    PT Time Calculation (min) 40 min    Activity Tolerance Patient tolerated treatment well    Behavior During Therapy [facility] for tasks assessed/performed               Past Medical History:  Diagnosis Date   Aortic valve sclerosis    echo 10/3816, soft systolic murmur   Arthritis    in right hip   Back pain    Basal cell carcinoma 05/31/2016   bcc left forehead  tx exc   Carotid artery disease (HCC)    40-59% bilateral, doppler December 2010   Cataract    Constipation    Coronary artery disease    a. BMS-mid RCA 11/2007 b. stress echo 02/2009 subtle inferior HK, lateral ST depressions with stress c. follow-up cath 02/2009: RCA stent patent, severe but stable stenosis of distal posterior lateral branch RCA. LV normal, med tx unless more sx c. ETT Myoview 08/27/12: negative for ischemia; EF 64%   Decreased hearing    Depression    Mild, August, 2012   Dyslipidemia    Easy bruising    Ejection fraction    EF normal, catheterization, 2011 /  EF 55%, echo, 2010   Fluid overload    Mild, stable since hospitalization in the past   GERD (gastroesophageal reflux disease)    occarionally, takes Pepcid over the counter   Heart murmur    has, occasional PVC's   History of right hip replacement    Hyperlipidemia    Hypertension    Joint pain    Myocardial infarction ([place])    2009   Palpitations    PVC (premature ventricular contraction)    per prior Holter monitor   Right hip pain 12/2009   Shellfish allergy    Stress fracture    Left foot   Trouble in sleeping    Vitamin B12 deficiency     Vitamin D deficiency    Past Surgical History:  Procedure Laterality Date   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/01/2005   BREAST BIOPSY Left    carcinoid tumor removal     colon-   CARDIAC CATHETERIZATION  1/211   Patent RCA stent, stenotic PLB supplying small distribution; medically managed   CATARACT EXTRACTION, BILATERAL     CORONARY ANGIOPLASTY WITH STENT PLACEMENT  11/2007   BMS-mid RCA   DIAGNOSTIC LAPAROSCOPY     1982 for endometriosis   DILATION AND CURETTAGE OF UTERUS  2005   SKIN CANCER EXCISION     TOTAL HIP ARTHROPLASTY Right 07/08/2014   Procedure: RIGHT TOTAL HIP ARTHROPLASTY ANTERIOR APPROACH;  Surgeon: Mcarthur Rossetti, MD;  Location: [facility];  Service: Orthopedics;  Laterality: Right;   WISDOM TOOTH EXTRACTION     Patient Active Problem List   Diagnosis Date Noted   Chronic pain of right knee 10/15/2021   Stress 08/30/2021   Prediabetes 07/11/2021   Degenerative disc disease, lumbar 05/19/2020   Restless leg syndrome 05/15/2020   Degenerative disc disease, cervical 02/02/2020   Polyarthralgia  01/03/2020   Patellofemoral arthritis of right knee 06/16/2019   Vitamin D deficiency, Vit D = 43.2 (01/25/19), Rx vit D 50K IU every 2 weeks 08/04/2018   Cyst of right breast 05/07/2018   Chronic headache disorder 02/08/2018   Essential hypertension 11/17/2017   Insomnia 07/15/2017   Female stress incontinence 07/15/2017   Gastroesophageal reflux disease with esophagitis, Rx Protonix 02/25/2017   Osteopenia 06/27/2015   Osteoarthritis of right hip 07/08/2014   Carotid artery disease ([place])    Aortic valve sclerosis    Hyperlipidemia    Coronary artery disease     PCP: Mitchel Honour, MD  REFERRING PROVIDER: Glennon Mac, DO  REFERRING DIAG:  M54.16 (ICD-10-CM) - Lumbar radiculopathy  M79.604 (ICD-10-CM) - Right leg pain  M51.36 (ICD-10-CM) - DDD (degenerative disc disease), lumbar    Rationale for Evaluation and Treatment Rehabilitation  THERAPY DIAG:  Chronic pain  of right knee  Muscle weakness (generalized)  Difficulty in walking, not elsewhere classified  ONSET DATE: 09/01/21  SUBJECTIVE:                                                                                                                                                                                           SUBJECTIVE STATEMENT:  The knee is still bothering me, my back is back to its usual it was doing good for awhile I've had to use MH more. I have an appointment for my knee the PRP hasn't helped at all. The HEP is helping its hard to get up/off the floor to do them but I manage. Its very difficult to not do daily stuff the pain I can deal with but I can't even enjoy gardening, can't go look at flowers, can't stand in the store it feels like excruciating bone on bone pain. Feeling depressed because of all this and not being able to do much, have talked to PCP about this.    PERTINENT HISTORY:  Rt THA, H/o knee pain that got better, L4/5 disk  PAIN:  Are you having pain? Yes: NPRS scale: 8/10 Pain location: Rt knee- surrounds patella Pain description: sharp when walking, thigh is achey Aggravating factors: weight bearing Relieving factors: it is just fine in sitting    PRECAUTIONS: Fall  WEIGHT BEARING RESTRICTIONS No  FALLS:  Has patient fallen in last 6 months? 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  2.  Small suprapatellar joint effusion.   3.  Small Baker's cyst measuring approximately 2.1 x 1.0 x 3.0 cm.   4. Benign osseous lesion in the lateral tibial plateau most consistent with enchondroma or intraosseous cyst.   PATIENT SURVEYS:  FOTO 39   COGNITION:  Overall cognitive status:  Within functional limits for tasks assessed     SENSATION: Tingling anterior thigh  PALPATION: End feels WFL without pain Lateral tracking of patella  LOWER EXTREMITY ROM:     Excellent passive ROM through hip and knee  LOWER EXTREMITY MMT:    Active SLR available to 4" off of table actively, passively to 90  MMT Right eval Left eval Rt/Lt  Hip flexion 24.1 27.5   Hip extension     Hip abduction 17.2 14.3   Hip adduction     Hip internal rotation     Hip external rotation     Knee flexion     Knee extension 46.3 42.6   Ankle dorsiflexion 29.2 28.9   Ankle plantarflexion     Ankle inversion     Ankle eversion      (Blank rows = not tested)   GAIT: Antalgic on Rt, notbale IR of Rt LE    TODAY'S TREATMENT   11/13/21  TherEx:  PPT x10 with 3 second holds  PPT with march x10  PPT with opposite UE/LE flexion to 90 x10 each side (R knee bent for pain control) On swiss ball: TrA sets x10 with 3 second holds, BUEs overhead with LAQ x10 B (limited by R LE pain) Bridge x10  U supine clamshells red TB x10 B   Manual:  Proximal quad release with tennis ball   9/18 Nustep 5 min L5 Prone quad stretch with strap Hooklying ab set + add with ball Supine quad set with towel roll- cues for ab set; added mini SLR Lumbar shift correction- Rt sidelying over 2 pillows Roller to Rt quads in mod thomas test position Hesch self correction for Lt post innom  9/5 Adapted for pool: hamstring stretch, hip flexion/ext, hip abd/add, fwd lunge step MANUAL: roller to Rt quads, 2-layer heel lift in Lt shoe; in prone: shifted into neutral alignment- Lt upper quadrant sacral PA hold, pelvis Rt to Lt shift Lumbar shift correction: Lt shoulder to wall, Lt hip to touch wall x10 with 1 breath hold  8/16  Nustep L3 x6 minutes  Bridges x10 Supine clams with red TB 1x10  2 second holds Bridge + clam x10 red TB 2 second holds Walking bridges 1x8  PPT 1x10 with 3 second holds  PPT  1x10 B with march PPT 1x10 B with low grade SLR  R piriformis stretch 2x30 seconds B L HS and piriformis stretches 2x30 seconds each  Sidelying clams red TB 1x10 B Prone hip extensions 1x10 B red TB             Hip hikes 1x10 B     PATIENT EDUCATION:  Education details: Anatomy of condition, POC, HEP, exercise form/rationale Person educated: Patient Education method: Explanation, Demonstration, Tactile cues, Verbal cues, and Handouts Education comprehension: verbalized understanding, returned demonstration, verbal cues required, tactile cues required, and needs further education   HOME EXERCISE PROGRAM: [id number]  ASSESSMENT:  CLINICAL IMPRESSION:  Tawnya arrives doing OK, had a lot of frustrations about her knee pain today, PT provide empathetic listening and encouraged ongoing f/u with PCP PRN regarding feelings of depression. Otherwise focused on core strengthening today, did well but  core remains very weak in general, given hx of hip surgery, back pain, and now severe knee pain I can certainly understand her frustrations here. Will continue to progress as able and tolerated.   OBJECTIVE IMPAIRMENTS Abnormal gait, decreased activity tolerance, decreased balance, decreased endurance, decreased mobility, difficulty walking, decreased strength, increased muscle spasms, postural dysfunction, and pain.   ACTIVITY LIMITATIONS lifting, bending, standing, squatting, stairs, transfers, bathing, and locomotion level  PARTICIPATION LIMITATIONS: meal prep, cleaning, laundry, driving, shopping, and community activity  PERSONAL FACTORS 1-2 comorbidities: multiple treatments for back pain, h/o Rt THA  are also affecting patient's functional outcome.   REHAB POTENTIAL: Good  CLINICAL DECISION MAKING: Evolving/moderate complexity  EVALUATION COMPLEXITY: Moderate   GOALS: Goals reviewed with patient? Yes  SHORT TERM GOALS: Target date: 10/16/21  Able to demo core control with pelvic  strengthening activities Baseline: Goal status: achieved   LONG TERM GOALS: Target date:11/20/21  Pt will meet FOTO goal Baseline:  Goal status: INITIAL  2.  Able to demonstrate hip abd strength via dynamometry to meet age-appropriate functional level Baseline:  Goal status: INITIAL  3.  Able to walk through grocery store before feeling limited by knee pain Baseline:  Goal status: INITIAL  4.  Able to demo SLR to at least 75 deg Baseline:  Goal status: INITIAL   PLAN: PT FREQUENCY: 1x/week  PT DURATION: 8 weeks  PLANNED INTERVENTIONS: Therapeutic exercises, Therapeutic activity, Neuromuscular re-education, Balance training, Gait training, Patient/Family education, Self Care, Joint mobilization, Stair training, Aquatic Therapy, Dry Needling, Electrical stimulation, Spinal mobilization, Cryotherapy, Moist heat, Taping, and Manual therapy.  PLAN FOR NEXT SESSION: will need re-assess/recert/POC update next session    Nykeem Citro U PT DPT PN2  11/13/2021, 9:37 AM    Referring diagnosis?  M54.16 (ICD-10-CM) - Lumbar radiculopathy  M79.604 (ICD-10-CM) - Right leg pain  M51.36 (ICD-10-CM) - DDD (degenerative disc disease), lumbar   Treatment diagnosis? (if different than referring diagnosis) m25.561  m62.81  r26.2 What was this (referring dx) caused by? '[]'$  Surgery '[]'$  Fall '[x]'$  Ongoing issue '[]'$  Arthritis '[]'$  Other: ____________  Laterality: '[x]'$  Rt '[]'$  Lt '[]'$  Both  Check all possible CPT codes:  *CHOOSE 10 OR LESS*    '[]'$  97110 (Therapeutic Exercise)  '[]'$  92507 (SLP Treatment)  '[]'$  97112 (Neuro Re-ed)   '[]'$  92526 (Swallowing Treatment)   '[]'$  97116 (Gait Training)   '[]'$  [id number] (Cognitive Training, 1st 15 minutes) '[]'$  97140 (Manual Therapy)   '[]'$  97130 (Cognitive Training, each add'l 15 minutes)  '[]'$  97164 (Re-evaluation)                              '[]'$  Other, List CPT Code ____________  '[]'$  97530 (Therapeutic Activities)     '[]'$  97535 (Self Care)   '[x]'$  All codes above (97110 -  97535)  '[]'$  97012 (Mechanical Traction)  '[]'$  97014 (E-stim Unattended)  '[]'$  97032 (E-stim manual)  '[]'$  97033 (Ionto)  '[]'$  97035 (Ultrasound) '[x]'$  97750 (Physical Performance Training) '[x]'$  [id number] (Aquatic Therapy) '[]'$  97016 (Vasopneumatic Device) '[]'$  [id number] (Paraffin) '[]'$  97034 (Contrast Bath) '[]'$  97597 (Wound Care 1st 20 sq cm) '[]'$  97598 (Wound Care each add'l 20 sq cm) '[]'$  97760 (Orthotic Fabrication, Fitting, Training Initial) '[]'$  [id number] (Prosthetic Management and Training Initial) '[]'$  [id number] (Orthotic or Prosthetic Training/ Modification Subsequent)

## 2021-11-16 ENCOUNTER — Other Ambulatory Visit (HOSPITAL_COMMUNITY): Payer: Self-pay | Admitting: Orthopedic Surgery

## 2021-11-16 ENCOUNTER — Other Ambulatory Visit: Payer: Self-pay | Admitting: Orthopedic Surgery

## 2021-11-16 DIAGNOSIS — Z96641 Presence of right artificial hip joint: Secondary | ICD-10-CM

## 2021-11-16 DIAGNOSIS — Z96649 Presence of unspecified artificial hip joint: Secondary | ICD-10-CM

## 2021-11-16 DIAGNOSIS — M1711 Unilateral primary osteoarthritis, right knee: Secondary | ICD-10-CM | POA: Diagnosis not present

## 2021-11-19 ENCOUNTER — Encounter (HOSPITAL_BASED_OUTPATIENT_CLINIC_OR_DEPARTMENT_OTHER): Payer: Self-pay | Admitting: Physical Therapy

## 2021-11-19 ENCOUNTER — Ambulatory Visit (INDEPENDENT_AMBULATORY_CARE_PROVIDER_SITE_OTHER): Payer: Medicare HMO | Admitting: Family Medicine

## 2021-11-19 ENCOUNTER — Encounter (INDEPENDENT_AMBULATORY_CARE_PROVIDER_SITE_OTHER): Payer: Self-pay | Admitting: Family Medicine

## 2021-11-19 ENCOUNTER — Ambulatory Visit (HOSPITAL_BASED_OUTPATIENT_CLINIC_OR_DEPARTMENT_OTHER): Payer: Medicare HMO | Attending: Sports Medicine | Admitting: Physical Therapy

## 2021-11-19 VITALS — BP 133/62 | HR 50 | Temp 97.9°F | Ht 60.0 in | Wt 160.0 lb

## 2021-11-19 DIAGNOSIS — M25561 Pain in right knee: Secondary | ICD-10-CM | POA: Diagnosis not present

## 2021-11-19 DIAGNOSIS — G8929 Other chronic pain: Secondary | ICD-10-CM | POA: Diagnosis not present

## 2021-11-19 DIAGNOSIS — R262 Difficulty in walking, not elsewhere classified: Secondary | ICD-10-CM | POA: Diagnosis not present

## 2021-11-19 DIAGNOSIS — E559 Vitamin D deficiency, unspecified: Secondary | ICD-10-CM | POA: Diagnosis not present

## 2021-11-19 DIAGNOSIS — Z96641 Presence of right artificial hip joint: Secondary | ICD-10-CM | POA: Diagnosis not present

## 2021-11-19 DIAGNOSIS — E669 Obesity, unspecified: Secondary | ICD-10-CM

## 2021-11-19 DIAGNOSIS — R7303 Prediabetes: Secondary | ICD-10-CM | POA: Diagnosis not present

## 2021-11-19 DIAGNOSIS — Z6831 Body mass index (BMI) 31.0-31.9, adult: Secondary | ICD-10-CM | POA: Diagnosis not present

## 2021-11-19 DIAGNOSIS — M6281 Muscle weakness (generalized): Secondary | ICD-10-CM | POA: Diagnosis not present

## 2021-11-19 MED ORDER — METFORMIN HCL 500 MG PO TABS: 500.0000 mg | ORAL_TABLET | Freq: Every day | ORAL | 0 refills | Status: DC

## 2021-11-19 NOTE — Therapy (Signed)
OUTPATIENT PHYSICAL THERAPY THORACOLUMBAR TREATMENT   Patient Name: Courtney [name] MRN: [id number] DOB:Dec 17, 1950, 71 y.o., female Today's Date: 11/19/2021   PT End of Session - 11/19/21 1251     Visit Number 6    Number of Visits 9    Date for PT Re-Evaluation 11/20/21    Authorization Type [employer] MCR    Progress Note Due on Visit 10    PT Start Time 1300    PT Stop Time 1342    PT Time Calculation (min) 42 min    Activity Tolerance Patient tolerated treatment well    Behavior During Therapy [facility] for tasks assessed/performed                Past Medical History:  Diagnosis Date   Aortic valve sclerosis    echo 14/4818, soft systolic murmur   Arthritis    in right hip   Back pain    Basal cell carcinoma 05/31/2016   bcc left forehead  tx exc   Carotid artery disease (HCC)    40-59% bilateral, doppler December 2010   Cataract    Constipation    Coronary artery disease    a. BMS-mid RCA 11/2007 b. stress echo 02/2009 subtle inferior HK, lateral ST depressions with stress c. follow-up cath 02/2009: RCA stent patent, severe but stable stenosis of distal posterior lateral branch RCA. LV normal, med tx unless more sx c. ETT Myoview 08/27/12: negative for ischemia; EF 64%   Decreased hearing    Depression    Mild, August, 2012   Dyslipidemia    Easy bruising    Ejection fraction    EF normal, catheterization, 2011 /  EF 55%, echo, 2010   Fluid overload    Mild, stable since hospitalization in the past   GERD (gastroesophageal reflux disease)    occarionally, takes Pepcid over the counter   Heart murmur    has, occasional PVC's   History of right hip replacement    Hyperlipidemia    Hypertension    Joint pain    Myocardial infarction ([place])    2009   Palpitations    PVC (premature ventricular contraction)    per prior Holter monitor   Right hip pain 12/2009   Shellfish allergy    Stress fracture    Left foot   Trouble in sleeping    Vitamin B12 deficiency     Vitamin D deficiency    Past Surgical History:  Procedure Laterality Date   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/01/2005   BREAST BIOPSY Left    carcinoid tumor removal     colon-   CARDIAC CATHETERIZATION  1/211   Patent RCA stent, stenotic PLB supplying small distribution; medically managed   CATARACT EXTRACTION, BILATERAL     CORONARY ANGIOPLASTY WITH STENT PLACEMENT  11/2007   BMS-mid RCA   DIAGNOSTIC LAPAROSCOPY     1982 for endometriosis   DILATION AND CURETTAGE OF UTERUS  2005   SKIN CANCER EXCISION     TOTAL HIP ARTHROPLASTY Right 07/08/2014   Procedure: RIGHT TOTAL HIP ARTHROPLASTY ANTERIOR APPROACH;  Surgeon: Mcarthur Rossetti, MD;  Location: [facility];  Service: Orthopedics;  Laterality: Right;   WISDOM TOOTH EXTRACTION     Patient Active Problem List   Diagnosis Date Noted   Chronic pain of right knee 10/15/2021   Stress 08/30/2021   Prediabetes 07/11/2021   Degenerative disc disease, lumbar 05/19/2020   Restless leg syndrome 05/15/2020   Degenerative disc disease, cervical 02/02/2020  Polyarthralgia 01/03/2020   Patellofemoral arthritis of right knee 06/16/2019   Vitamin D deficiency, Vit D = 43.2 (01/25/19), Rx vit D 50K IU every 2 weeks 08/04/2018   Cyst of right breast 05/07/2018   Chronic headache disorder 02/08/2018   Essential hypertension 11/17/2017   Class 1 obesity with serious comorbidity and body mass index (BMI) of 30.0 to 30.9 in adult 07/15/2017   Insomnia 07/15/2017   Female stress incontinence 07/15/2017   Gastroesophageal reflux disease with esophagitis, Rx Protonix 02/25/2017   Osteopenia 06/27/2015   Osteoarthritis of right hip 07/08/2014   Carotid artery disease ([place])    Aortic valve sclerosis    Hyperlipidemia    Coronary artery disease     PCP: Mitchel Honour, MD  REFERRING PROVIDER: Glennon Mac, DO  REFERRING DIAG:  M54.16 (ICD-10-CM) - Lumbar radiculopathy  M79.604 (ICD-10-CM) - Right leg pain  M51.36 (ICD-10-CM) - DDD (degenerative disc  disease), lumbar    Rationale for Evaluation and Treatment Rehabilitation  THERAPY DIAG:  Chronic pain of right knee  Muscle weakness (generalized)  Difficulty in walking, not elsewhere classified  ONSET DATE: 09/01/21  SUBJECTIVE:                                                                                                                                                                                           SUBJECTIVE STATEMENT: Dr Lyla Glassing is saying that the knee pain is coming from my hip. Nuclear test on the 11th.    PERTINENT HISTORY:  Rt THA, H/o knee pain that got better, L4/5 disk  PAIN:  Are you having pain? Yes: NPRS scale: 8/10 Pain location: Rt knee- surrounds patella Pain description: sharp when walking, thigh is achey Aggravating factors: weight bearing Relieving factors: it is just fine in sitting    PRECAUTIONS: Fall  WEIGHT BEARING RESTRICTIONS No  FALLS:  Has patient fallen in last 6 months? 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   2.  Small suprapatellar joint effusion.   3.  Small Baker's cyst measuring approximately 2.1 x 1.0 x 3.0 cm.   4. Benign osseous lesion in the lateral tibial plateau most consistent with enchondroma or intraosseous cyst.   PATIENT SURVEYS:  FOTO 39   COGNITION:  Overall cognitive status: Within functional limits for tasks assessed     SENSATION: Tingling anterior thigh  PALPATION: End feels WFL without pain  Lateral tracking of patella  LOWER EXTREMITY ROM:     Excellent passive ROM through hip and knee  LOWER EXTREMITY MMT:    Active SLR available to 4" off of table actively, passively to 90  MMT Right eval Left eval  Rt/Lt 11/19/21  Hip flexion 24.1 27.5   Hip extension     Hip abduction 17.2 14.3   Hip adduction     Hip internal rotation     Hip external rotation     Knee flexion     Knee extension 46.3 42.6 40.9/40.6  Ankle dorsiflexion 29.2 28.9   Ankle plantarflexion     Ankle inversion     Ankle eversion      (Blank rows = not tested)   GAIT: Antalgic on Rt, notbale IR of Rt LE    TODAY'S TREATMENT   Treatment                            11/19/21:  Chair yoga adaptions to : seated hip flexion, hamstring stretch, lumbar traction, warrior 1 LAQ ball bw knees- no trunk support Bil LE LAQ holding ball bw heels Seated biceps curls to OH press, 5lb Bent over triceps kicks Shoulder series: small & large circles, horiz ab/add, ER/IR at 90  11/13/21  TherEx:  PPT x10 with 3 second holds  PPT with march x10  PPT with opposite UE/LE flexion to 90 x10 each side (R knee bent for pain control) On swiss ball: TrA sets x10 with 3 second holds, BUEs overhead with LAQ x10 B (limited by R LE pain) Bridge x10  U supine clamshells red TB x10 B   Manual:  Proximal quad release with tennis ball   9/18 Nustep 5 min L5 Prone quad stretch with strap Hooklying ab set + add with ball Supine quad set with towel roll- cues for ab set; added mini SLR Lumbar shift correction- Rt sidelying over 2 pillows Roller to Rt quads in [employer] test position Hesch self correction for Lt post innom    PATIENT EDUCATION:  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 Having further testing done on 11th. Chair yoga for lumbar stretching and core stability. UE challenges for muscle challenge.   OBJECTIVE IMPAIRMENTS Abnormal gait, decreased activity tolerance, decreased balance, decreased endurance, decreased mobility, difficulty walking, decreased strength, increased muscle spasms, postural dysfunction, and pain.   ACTIVITY LIMITATIONS lifting, bending, standing, squatting, stairs, transfers, bathing, and locomotion level  PARTICIPATION LIMITATIONS: meal prep, cleaning, laundry, driving, shopping, and community activity  PERSONAL FACTORS 1-2 comorbidities: multiple treatments for back pain, h/o Rt THA  are also affecting patient's functional outcome.   REHAB POTENTIAL: Good  CLINICAL DECISION MAKING: Evolving/moderate complexity  EVALUATION COMPLEXITY: Moderate   GOALS: Goals reviewed with patient? Yes  SHORT TERM GOALS: Target date: 10/16/21  Able to demo core control with pelvic strengthening activities Baseline: Goal status: achieved   LONG TERM GOALS: Target date:11/20/21  Pt will meet FOTO goal Baseline:  Goal status: INITIAL  2.  Able to demonstrate hip abd strength via dynamometry to meet age-appropriate  functional level Baseline:  Goal status: INITIAL  3.  Able to walk through grocery store before feeling limited by knee pain Baseline:  Goal status: INITIAL  4.  Able to demo SLR to at least 75 deg Baseline:  Goal status: INITIAL   PLAN: PT FREQUENCY: 1x/week  PT DURATION: 8 weeks  PLANNED INTERVENTIONS: Therapeutic exercises, Therapeutic activity, Neuromuscular re-education, Balance training, Gait training, Patient/Family education, Self Care, Joint mobilization, Stair training, Aquatic Therapy, Dry Needling, Electrical stimulation, Spinal mobilization, Cryotherapy, Moist heat, Taping, and Manual therapy.  PLAN FOR NEXT SESSION: will need re-assess/recert/POC update next  session    Kristen U PT DPT PN2  11/19/2021, 1:42 PM    Referring diagnosis?  M54.16 (ICD-10-CM) - Lumbar radiculopathy  M79.604 (ICD-10-CM) - Right leg pain  M51.36 (ICD-10-CM) - DDD (degenerative disc disease), lumbar   Treatment diagnosis? (if different than referring diagnosis) m25.561  m62.81  r26.2 What was this (referring dx) caused by? '[]'$  Surgery '[]'$  Fall '[x]'$  Ongoing issue '[]'$  Arthritis '[]'$  Other: ____________  Laterality: '[x]'$  Rt '[]'$  Lt '[]'$  Both  Check all possible CPT codes:  *CHOOSE 10 OR LESS*    '[]'$  97110 (Therapeutic Exercise)  '[]'$  92507 (SLP Treatment)  '[]'$  97112 (Neuro Re-ed)   '[]'$  92526 (Swallowing Treatment)   '[]'$  97116 (Gait Training)   '[]'$  [id number] (Cognitive Training, 1st 15 minutes) '[]'$  97140 (Manual Therapy)   '[]'$  97130 (Cognitive Training, each add'l 15 minutes)  '[]'$  97164 (Re-evaluation)                              '[]'$  Other, List CPT Code ____________  '[]'$  97530 (Therapeutic Activities)     '[]'$  97535 (Self Care)   '[x]'$  All codes above (97110 - 97535)  '[]'$  97012 (Mechanical Traction)  '[]'$  97014 (E-stim Unattended)  '[]'$  97032 (E-stim manual)  '[]'$  97033 (Ionto)  '[]'$  97035 (Ultrasound) '[x]'$  97750 (Physical Performance Training) '[x]'$  [id number] (Aquatic Therapy) '[]'$  97016 (Vasopneumatic Device) '[]'$  [id number] (Paraffin) '[]'$  97034 (Contrast Bath) '[]'$  97597 (Wound Care 1st 20 sq cm) '[]'$  97598 (Wound Care each add'l 20 sq cm) '[]'$  97760 (Orthotic Fabrication, Fitting, Training Initial) '[]'$  [id number] (Prosthetic Management and Training Initial) '[]'$  [id number] (Orthotic or Prosthetic Training/ Modification Subsequent)

## 2021-11-20 ENCOUNTER — Encounter (HOSPITAL_BASED_OUTPATIENT_CLINIC_OR_DEPARTMENT_OTHER): Payer: Medicare HMO | Admitting: Physical Therapy

## 2021-11-20 NOTE — Progress Notes (Signed)
Chief Complaint:   OBESITY Courtney [name] is here to discuss her progress with her obesity treatment plan along with follow-up of her obesity related diagnoses. Courtney [name] is on the Category 1 Plan and states she is following her eating plan approximately 85% of the time. Courtney [name] states she is doing physical therapy.   Today's visit was #: 101 Starting weight: 157 lbs Starting date: 06/16/2017 Today's weight: 160 lbs Today's date: 11/19/2021 Total lbs lost to date: 0 Total lbs lost since last in-office visit: 0  Interim History: Courtney [name] has had limited activity due to knee and hip pain.  She is doing physical therapy for her back and is likely going to need a hip replacement.  She is working on getting her protein, but she struggles especially in the p.m.  She continues to work on portion control and Psychologist, counselling.  Subjective:   1. Prediabetes Courtney [name]'s A1c is slightly elevated.  She continues to work on her diet.  2. Vitamin D deficiency Courtney [name]'s Vitamin D level is at goal.  She is having a bone scan soon to prepare for hip surgery.  Assessment/Plan:   1. Prediabetes Courtney [name] agreed to start metformin 500 mg q AM with breakfast, with a 90 day supply with no refills.   - metFORMIN (GLUCOPHAGE) 500 MG tablet; Take 1 tablet (500 mg total) by mouth daily with breakfast.  Dispense: 90 tablet; Refill: 0  2. Vitamin D deficiency Courtney [name] will continue prescription Vitamin D 50,000 IU every week and will follow-up for routine testing of Vitamin D, at least 2-3 times per year to avoid over-replacement.  3. Obesity, Current BMI 31.2 Courtney [name] is currently in the action stage of change. As such, her goal is to continue with weight loss efforts. She has agreed to the Category 1 Plan.   Exercise goals: Chair yoga exercises.  Behavioral modification strategies: increasing lean protein intake.  Courtney [name] has agreed to follow-up with our clinic in 6 weeks. She was informed of the importance of frequent  follow-up visits to maximize her success with intensive lifestyle modifications for her multiple health conditions.   Objective:   Blood pressure 133/62, pulse (!) 50, temperature 97.9 F (36.6 C), height 5' (1.524 m), weight 160 lb (72.6 kg), SpO2 98 %. Body mass index is 31.25 kg/m.  General: Cooperative, alert, well developed, in no acute distress. HEENT: Conjunctivae and lids unremarkable. Cardiovascular: Regular rhythm.  Lungs: Normal work of breathing. Neurologic: No focal deficits.   Lab Results  Component Value Date   CREATININE 0.87 10/18/2021   BUN 22 10/18/2021   NA 140 10/18/2021   K 4.9 10/18/2021   CL 104 10/18/2021   CO2 23 10/18/2021   Lab Results  Component Value Date   ALT 19 10/18/2021   AST 20 10/18/2021   ALKPHOS 62 10/18/2021   BILITOT 0.4 10/18/2021   Lab Results  Component Value Date   HGBA1C 5.8 (H) 10/18/2021   HGBA1C 5.7 (H) 07/11/2021   HGBA1C 6.0 (H) 03/20/2021   HGBA1C 5.8 (H) 08/30/2020   HGBA1C 5.8 (H) 03/30/2020   Lab Results  Component Value Date   INSULIN 6.7 10/18/2021   INSULIN 9.0 07/11/2021   INSULIN 8.2 03/20/2021   INSULIN 6.8 08/30/2020   INSULIN 9.4 03/30/2020   Lab Results  Component Value Date   TSH 1.590 10/18/2021   Lab Results  Component Value Date   CHOL 146 10/18/2021   HDL 88 10/18/2021   LDLCALC 41 10/18/2021   TRIG 95  10/18/2021   CHOLHDL 1.6 05/15/2021   Lab Results  Component Value Date   VD25OH 63.8 10/18/2021   VD25OH 63.1 07/11/2021   VD25OH 79.4 03/20/2021   Lab Results  Component Value Date   WBC 7.3 01/03/2020   HGB 13.0 01/03/2020   HCT 37.7 01/03/2020   MCV 91.5 01/03/2020   PLT 196.0 01/03/2020   Lab Results  Component Value Date   IRON 117 10/17/2020   TIBC 431.2 10/17/2020   FERRITIN 133.2 10/17/2020   Attestation Statements:   Reviewed by clinician on day of visit: allergies, medications, problem list, medical history, surgical history, family history, social history,  and previous encounter notes.   I, Trixie Dredge, am acting as Location manager for Dennard Nip, MD.  I have reviewed the above documentation for accuracy and completeness, and I agree with the above. -  Dennard Nip, MD

## 2021-11-27 ENCOUNTER — Ambulatory Visit (HOSPITAL_BASED_OUTPATIENT_CLINIC_OR_DEPARTMENT_OTHER): Payer: Medicare HMO | Admitting: Physical Therapy

## 2021-11-27 ENCOUNTER — Encounter (HOSPITAL_BASED_OUTPATIENT_CLINIC_OR_DEPARTMENT_OTHER): Payer: Self-pay | Admitting: Physical Therapy

## 2021-11-27 DIAGNOSIS — G8929 Other chronic pain: Secondary | ICD-10-CM

## 2021-11-27 DIAGNOSIS — R262 Difficulty in walking, not elsewhere classified: Secondary | ICD-10-CM | POA: Diagnosis not present

## 2021-11-27 DIAGNOSIS — M6281 Muscle weakness (generalized): Secondary | ICD-10-CM

## 2021-11-27 DIAGNOSIS — M25561 Pain in right knee: Secondary | ICD-10-CM | POA: Diagnosis not present

## 2021-11-27 NOTE — Therapy (Signed)
OUTPATIENT PHYSICAL THERAPY THORACOLUMBAR TREATMENT   Patient Name: Courtney [name] MRN: [id number] DOB:[birth year], 71 y.o., female Today's Date: 11/27/2021   PT End of Session - 11/27/21 1015     Visit Number 7    Number of Visits 19    Date for PT Re-Evaluation 01/11/22    Authorization Type [employer] MCR    Progress Note Due on Visit 76    PT Start Time 1014    PT Stop Time 1058    PT Time Calculation (min) 44 min    Activity Tolerance Patient tolerated treatment well    Behavior During Therapy [facility] for tasks assessed/performed                Past Medical History:  Diagnosis Date   Aortic valve sclerosis    echo 41/9622, soft systolic murmur   Arthritis    in right hip   Back pain    Basal cell carcinoma 05/31/2016   bcc left forehead  tx exc   Carotid artery disease (HCC)    40-59% bilateral, doppler December 2010   Cataract    Constipation    Coronary artery disease    a. BMS-mid RCA 11/2007 b. stress echo 02/2009 subtle inferior HK, lateral ST depressions with stress c. follow-up cath 02/2009: RCA stent patent, severe but stable stenosis of distal posterior lateral branch RCA. LV normal, med tx unless more sx c. ETT Myoview 08/27/12: negative for ischemia; EF 64%   Decreased hearing    Depression    Mild, August, 2012   Dyslipidemia    Easy bruising    Ejection fraction    EF normal, catheterization, 2011 /  EF 55%, echo, 2010   Fluid overload    Mild, stable since hospitalization in the past   GERD (gastroesophageal reflux disease)    occarionally, takes Pepcid over the counter   Heart murmur    has, occasional PVC's   History of right hip replacement    Hyperlipidemia    Hypertension    Joint pain    Myocardial infarction ([place])    2009   Palpitations    PVC (premature ventricular contraction)    per prior Holter monitor   Right hip pain 12/2009   Shellfish allergy    Stress fracture    Left foot   Trouble in sleeping    Vitamin B12 deficiency     Vitamin D deficiency    Past Surgical History:  Procedure Laterality Date   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/01/2005   BREAST BIOPSY Left    carcinoid tumor removal     colon-   CARDIAC CATHETERIZATION  1/211   Patent RCA stent, stenotic PLB supplying small distribution; medically managed   CATARACT EXTRACTION, BILATERAL     CORONARY ANGIOPLASTY WITH STENT PLACEMENT  11/2007   BMS-mid RCA   DIAGNOSTIC LAPAROSCOPY     1982 for endometriosis   DILATION AND CURETTAGE OF UTERUS  2005   SKIN CANCER EXCISION     TOTAL HIP ARTHROPLASTY Right 07/08/2014   Procedure: RIGHT TOTAL HIP ARTHROPLASTY ANTERIOR APPROACH;  Surgeon: Mcarthur Rossetti, MD;  Location: [facility];  Service: Orthopedics;  Laterality: Right;   WISDOM TOOTH EXTRACTION     Patient Active Problem List   Diagnosis Date Noted   Chronic pain of right knee 10/15/2021   Stress 08/30/2021   Prediabetes 07/11/2021   Degenerative disc disease, lumbar 05/19/2020   Restless leg syndrome 05/15/2020   Degenerative disc disease, cervical 02/02/2020  Polyarthralgia 01/03/2020   Patellofemoral arthritis of right knee 06/16/2019   Vitamin D deficiency, Vit D = 43.2 (01/25/19), Rx vit D 50K IU every 2 weeks 08/04/2018   Cyst of right breast 05/07/2018   Chronic headache disorder 02/08/2018   Essential hypertension 11/17/2017   Class 1 obesity with serious comorbidity and body mass index (BMI) of 30.0 to 30.9 in adult 07/15/2017   Insomnia 07/15/2017   Female stress incontinence 07/15/2017   Gastroesophageal reflux disease with esophagitis, Rx Protonix 02/25/2017   Osteopenia 06/27/2015   Osteoarthritis of right hip 07/08/2014   Carotid artery disease ([place])    Aortic valve sclerosis    Hyperlipidemia    Coronary artery disease     PCP: Mitchel Honour, MD  REFERRING PROVIDER: Glennon Mac, DO  REFERRING DIAG:  M54.16 (ICD-10-CM) - Lumbar radiculopathy  M79.604 (ICD-10-CM) - Right leg pain  M51.36 (ICD-10-CM) - DDD (degenerative disc  disease), lumbar    Rationale for Evaluation and Treatment Rehabilitation  THERAPY DIAG:  Chronic pain of right knee  Muscle weakness (generalized)  Difficulty in walking, not elsewhere classified  ONSET DATE: 09/01/21  SUBJECTIVE:                                                                                                                                                                                          Progress Note Reporting Period 09/25/21 to 11/27/21  See note below for Objective Data and Assessment of Progress/Goals.     SUBJECTIVE STATEMENT: Nuclear test tomorrow. Drove to [place] over the weekend so my back is all jacked up. I think the underlying cuase is still there but I found the exercises are giving some relief.   PERTINENT HISTORY:  Rt THA, H/o knee pain that got better, L4/5 disk  PAIN:  Are you having pain? Yes: NPRS scale: 7/10 Pain location: Rt knee- surrounds patella Pain description: sharp when walking, thigh is achey Aggravating factors: weight bearing Relieving factors: it is just fine in sitting    PRECAUTIONS: Fall  WEIGHT BEARING RESTRICTIONS No  FALLS:  Has patient fallen in last 6 months? 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   2.  Small suprapatellar joint effusion.   3.  Small Baker's cyst measuring approximately 2.1 x 1.0 x 3.0 cm.   4. Benign osseous lesion in the lateral tibial plateau most consistent with enchondroma or intraosseous cyst.  PATIENT SURVEYS:  FOTO 39 10/10: 36   SENSATION: Tingling anterior thigh still present  PALPATION: 10/10: Patella stays in groove until very end range of ext and then tracks laterally  LOWER EXTREMITY ROM:      10/10: Excellent passive ROM through hip and knee continues  LOWER EXTREMITY MMT:    EVAL: Active SLR available to 4" off of table actively, passively to 90 10/10: active SLR to 12" off table actively, passively to 90  MMT Right eval Left eval Rt/Lt 11/19/21  Hip flexion 24.1 27.5   Hip extension     Hip abduction 17.2 14.3   Hip adduction     Hip internal rotation     Hip external rotation     Knee flexion     Knee extension 46.3 42.6 40.9/40.6  Ankle dorsiflexion 29.2 28.9   Ankle plantarflexion     Ankle inversion     Ankle eversion      (Blank rows = not tested)   GAIT: 10/10: able to ambulate without antalgic pattern    TODAY'S TREATMENT   Treatment                            11/27/21:  Re-evaluation testing Seated round back progressed to russian twists- seated on table with heels on step; single arm diag reach 1lb Sidelying clams Sidelying hip circles- small and large  MANUAL: STM lumbar paraspinals  Treatment                            11/19/21:  Chair yoga adaptions to : seated hip flexion, hamstring stretch, lumbar traction, warrior 1 LAQ ball bw knees- no trunk support Bil LE LAQ holding ball bw heels Seated biceps curls to OH press, 5lb Bent over triceps kicks Shoulder series: small & large circles, horiz ab/add, ER/IR at 90  11/13/21  TherEx:  PPT x10 with 3 second holds  PPT with march x10  PPT with opposite UE/LE flexion to 90 x10 each side (R knee bent for pain control) On swiss ball: TrA sets x10 with 3 second holds, BUEs overhead with LAQ x10 B (limited by R LE pain) Bridge x10  U supine clamshells red TB x10 B   Manual:  Proximal quad release with tennis ball   9/18 Nustep 5 min L5 Prone quad stretch with strap Hooklying ab set + add with ball Supine quad set with towel roll- cues for ab set; added mini SLR Lumbar shift correction- Rt sidelying over 2 pillows Roller to Rt quads in [employer] test position Hesch self  correction for Lt post innom    PATIENT EDUCATION:  Education details: Geophysicist/field seismologist of condition, POC, HEP, exercise form/rationale Person educated: Patient Education method: Explanation, Demonstration, Tactile cues, Verbal cues, and Handouts Education comprehension: verbalized understanding, returned demonstration, verbal cues required, tactile cues required, and needs further education   HOME EXERCISE PROGRAM: [id number]  ASSESSMENT:  CLINICAL IMPRESSION:  Pt has demonstrated changes and improvements as well as changes in an unfavorable direction. POC extended and will be submitted to insurance for requested extension. Nuclear test will determine if further surgical intervention is required so either way she will benefit from management of biomechanical chain symptoms and strengthening.   OBJECTIVE IMPAIRMENTS Abnormal gait, decreased activity tolerance, decreased balance, decreased endurance, decreased mobility, difficulty walking, decreased strength, increased muscle spasms, postural dysfunction, and pain.   ACTIVITY LIMITATIONS lifting, bending,  standing, squatting, stairs, transfers, bathing, and locomotion level  PARTICIPATION LIMITATIONS: meal prep, cleaning, laundry, driving, shopping, and community activity  PERSONAL FACTORS 1-2 comorbidities: multiple treatments for back pain, h/o Rt THA  are also affecting patient's functional outcome.   REHAB POTENTIAL: Good  CLINICAL DECISION MAKING: Evolving/moderate complexity  EVALUATION COMPLEXITY: Moderate   GOALS: Goals reviewed with patient? Yes  SHORT TERM GOALS: Target date: 10/16/21  Able to demo core control with pelvic strengthening activities Baseline: Goal status: achieved   LONG TERM GOALS: Target date:POC date  Pt will meet FOTO goal Baseline:  Goal status: ongoing  2.  Able to demonstrate hip abd strength via dynamometry to meet age-appropriate functional level Baseline:  see chart Goal status: ongoing  3.   Able to walk through grocery store before feeling limited by knee pain Baseline: 10/10: I can now go down 1 aisle and then have to go, previously was just to door Goal status: ongoing  4.  Able to demo SLR to at least 75 deg Baseline: 10/10: 25 deg Goal status: ongoing   PLAN: PT FREQUENCY: 1x/week  PT DURATION: 8 weeks  PLANNED INTERVENTIONS: Therapeutic exercises, Therapeutic activity, Neuromuscular re-education, Balance training, Gait training, Patient/Family education, Self Care, Joint mobilization, Stair training, Aquatic Therapy, Dry Needling, Electrical stimulation, Spinal mobilization, Cryotherapy, Moist heat, Taping, and Manual therapy.  PLAN FOR NEXT SESSION: core stability   Rawn Quiroa C. Breelynn Bankert PT, DPT 11/27/21 8:20 PM     Referring diagnosis?  M54.16 (ICD-10-CM) - Lumbar radiculopathy  M79.604 (ICD-10-CM) - Right leg pain  M51.36 (ICD-10-CM) - DDD (degenerative disc disease), lumbar   Treatment diagnosis? (if different than referring diagnosis) m25.561  m62.81  r26.2 What was this (referring dx) caused by? '[]'$  Surgery '[]'$  Fall '[x]'$  Ongoing issue '[]'$  Arthritis '[]'$  Other: ____________  Laterality: '[x]'$  Rt '[]'$  Lt '[]'$  Both  Check all possible CPT codes:  *CHOOSE 10 OR LESS*    '[]'$  97110 (Therapeutic Exercise)  '[]'$  92507 (SLP Treatment)  '[]'$  97112 (Neuro Re-ed)   '[]'$  92526 (Swallowing Treatment)   '[]'$  97116 (Gait Training)   '[]'$  [id number] (Cognitive Training, 1st 15 minutes) '[]'$  97140 (Manual Therapy)   '[]'$  97130 (Cognitive Training, each add'l 15 minutes)  '[]'$  97164 (Re-evaluation)                              '[]'$  Other, List CPT Code ____________  '[]'$  97530 (Therapeutic Activities)     '[]'$  97535 (Self Care)   '[x]'$  All codes above (97110 - 97535)  '[]'$  97012 (Mechanical Traction)  '[]'$  97014 (E-stim Unattended)  '[]'$  97032 (E-stim manual)  '[]'$  97033 (Ionto)  '[]'$  97035 (Ultrasound) '[x]'$  97750 (Physical Performance Training) '[x]'$  [id number] (Aquatic Therapy) '[]'$  97016 (Vasopneumatic  Device) '[]'$  [id number] (Paraffin) '[]'$  97034 (Contrast Bath) '[]'$  97597 (Wound Care 1st 20 sq cm) '[]'$  97598 (Wound Care each add'l 20 sq cm) '[]'$  97760 (Orthotic Fabrication, Fitting, Training Initial) '[]'$  [id number] (Prosthetic Management and Training Initial) '[]'$  [id number] (Orthotic or Prosthetic Training/ Modification Subsequent)

## 2021-11-28 ENCOUNTER — Encounter (HOSPITAL_COMMUNITY)
Admission: RE | Admit: 2021-11-28 | Discharge: 2021-11-28 | Disposition: A | Discharge status: Home / Self Care | Payer: Medicare HMO | Source: Ambulatory Visit | Attending: Orthopedic Surgery | Admitting: Orthopedic Surgery

## 2021-11-28 DIAGNOSIS — M47816 Spondylosis without myelopathy or radiculopathy, lumbar region: Secondary | ICD-10-CM | POA: Diagnosis not present

## 2021-11-28 DIAGNOSIS — Z96641 Presence of right artificial hip joint: Secondary | ICD-10-CM | POA: Insufficient documentation

## 2021-11-28 DIAGNOSIS — Z96649 Presence of unspecified artificial hip joint: Secondary | ICD-10-CM | POA: Diagnosis not present

## 2021-11-28 DIAGNOSIS — M1611 Unilateral primary osteoarthritis, right hip: Secondary | ICD-10-CM | POA: Diagnosis not present

## 2021-11-28 DIAGNOSIS — Z471 Aftercare following joint replacement surgery: Secondary | ICD-10-CM | POA: Diagnosis not present

## 2021-11-28 MED ORDER — TECHNETIUM TC 99M MEDRONATE IV KIT
20.0000 | PACK | Freq: Once | INTRAVENOUS | Status: AC | PRN
Start: 2021-11-28 — End: 2021-11-28
  Administered 2021-11-28: 20.3 via INTRAVENOUS

## 2021-12-04 ENCOUNTER — Encounter (HOSPITAL_BASED_OUTPATIENT_CLINIC_OR_DEPARTMENT_OTHER): Payer: Medicare HMO | Admitting: Physical Therapy

## 2021-12-04 DIAGNOSIS — M1712 Unilateral primary osteoarthritis, left knee: Secondary | ICD-10-CM | POA: Diagnosis not present

## 2021-12-04 DIAGNOSIS — Z96641 Presence of right artificial hip joint: Secondary | ICD-10-CM | POA: Diagnosis not present

## 2021-12-04 DIAGNOSIS — M1711 Unilateral primary osteoarthritis, right knee: Secondary | ICD-10-CM | POA: Diagnosis not present

## 2021-12-05 ENCOUNTER — Encounter (HOSPITAL_BASED_OUTPATIENT_CLINIC_OR_DEPARTMENT_OTHER): Payer: Self-pay | Admitting: Physical Therapy

## 2021-12-05 ENCOUNTER — Ambulatory Visit (HOSPITAL_BASED_OUTPATIENT_CLINIC_OR_DEPARTMENT_OTHER): Payer: Medicare HMO | Admitting: Physical Therapy

## 2021-12-05 DIAGNOSIS — R262 Difficulty in walking, not elsewhere classified: Secondary | ICD-10-CM | POA: Diagnosis not present

## 2021-12-05 DIAGNOSIS — G8929 Other chronic pain: Secondary | ICD-10-CM | POA: Diagnosis not present

## 2021-12-05 DIAGNOSIS — M6281 Muscle weakness (generalized): Secondary | ICD-10-CM | POA: Diagnosis not present

## 2021-12-05 DIAGNOSIS — M25561 Pain in right knee: Secondary | ICD-10-CM | POA: Diagnosis not present

## 2021-12-05 NOTE — Therapy (Signed)
OUTPATIENT PHYSICAL THERAPY THORACOLUMBAR TREATMENT   Patient Name: Courtney [name] MRN: [id number] DOB:Mar 01, 1950, 71 y.o., female Today's Date: 12/05/2021   PT End of Session - 12/05/21 1018     Visit Number 8    Number of Visits 19    Date for PT Re-Evaluation 01/11/22    Authorization Type [employer] MCR    Progress Note Due on Visit 50    PT Start Time 1018    PT Stop Time 1100    PT Time Calculation (min) 42 min    Activity Tolerance Patient tolerated treatment well    Behavior During Therapy [facility] for tasks assessed/performed                 Past Medical History:  Diagnosis Date   Aortic valve sclerosis    echo 03/5850, soft systolic murmur   Arthritis    in right hip   Back pain    Basal cell carcinoma 05/31/2016   bcc left forehead  tx exc   Carotid artery disease (HCC)    40-59% bilateral, doppler December 2010   Cataract    Constipation    Coronary artery disease    a. BMS-mid RCA 11/2007 b. stress echo 02/2009 subtle inferior HK, lateral ST depressions with stress c. follow-up cath 02/2009: RCA stent patent, severe but stable stenosis of distal posterior lateral branch RCA. LV normal, med tx unless more sx c. ETT Myoview 08/27/12: negative for ischemia; EF 64%   Decreased hearing    Depression    Mild, August, 2012   Dyslipidemia    Easy bruising    Ejection fraction    EF normal, catheterization, 2011 /  EF 55%, echo, 2010   Fluid overload    Mild, stable since hospitalization in the past   GERD (gastroesophageal reflux disease)    occarionally, takes Pepcid over the counter   Heart murmur    has, occasional PVC's   History of right hip replacement    Hyperlipidemia    Hypertension    Joint pain    Myocardial infarction ([place])    2009   Palpitations    PVC (premature ventricular contraction)    per prior Holter monitor   Right hip pain 12/2009   Shellfish allergy    Stress fracture    Left foot   Trouble in sleeping    Vitamin B12 deficiency     Vitamin D deficiency    Past Surgical History:  Procedure Laterality Date   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/01/2005   BREAST BIOPSY Left    carcinoid tumor removal     colon-   CARDIAC CATHETERIZATION  1/211   Patent RCA stent, stenotic PLB supplying small distribution; medically managed   CATARACT EXTRACTION, BILATERAL     CORONARY ANGIOPLASTY WITH STENT PLACEMENT  11/2007   BMS-mid RCA   DIAGNOSTIC LAPAROSCOPY     1982 for endometriosis   DILATION AND CURETTAGE OF UTERUS  2005   SKIN CANCER EXCISION     TOTAL HIP ARTHROPLASTY Right 07/08/2014   Procedure: RIGHT TOTAL HIP ARTHROPLASTY ANTERIOR APPROACH;  Surgeon: Mcarthur Rossetti, MD;  Location: [facility];  Service: Orthopedics;  Laterality: Right;   WISDOM TOOTH EXTRACTION     Patient Active Problem List   Diagnosis Date Noted   Chronic pain of right knee 10/15/2021   Stress 08/30/2021   Prediabetes 07/11/2021   Degenerative disc disease, lumbar 05/19/2020   Restless leg syndrome 05/15/2020   Degenerative disc disease, cervical 02/02/2020  Polyarthralgia 01/03/2020   Patellofemoral arthritis of right knee 06/16/2019   Vitamin D deficiency, Vit D = 43.2 (01/25/19), Rx vit D 50K IU every 2 weeks 08/04/2018   Cyst of right breast 05/07/2018   Chronic headache disorder 02/08/2018   Essential hypertension 11/17/2017   Class 1 obesity with serious comorbidity and body mass index (BMI) of 30.0 to 30.9 in adult 07/15/2017   Insomnia 07/15/2017   Female stress incontinence 07/15/2017   Gastroesophageal reflux disease with esophagitis, Rx Protonix 02/25/2017   Osteopenia 06/27/2015   Osteoarthritis of right hip 07/08/2014   Carotid artery disease ([place])    Aortic valve sclerosis    Hyperlipidemia    Coronary artery disease     PCP: Mitchel Honour, MD  REFERRING PROVIDER: Glennon Mac, DO  REFERRING DIAG:  M54.16 (ICD-10-CM) - Lumbar radiculopathy  M79.604 (ICD-10-CM) - Right leg pain  M51.36 (ICD-10-CM) - DDD (degenerative disc  disease), lumbar    Rationale for Evaluation and Treatment Rehabilitation  THERAPY DIAG:  Chronic pain of right knee  Muscle weakness (generalized)  Difficulty in walking, not elsewhere classified  ONSET DATE: 09/01/21  SUBJECTIVE:                                                                                                                                                                                           SUBJECTIVE STATEMENT: Testing/imaging was negative. He poked and proded the knee and it didn't hurt until I left the building. Nov 3 MRI for hip. And Nov 10 with another MD.   PERTINENT HISTORY:  Rt THA, H/o knee pain that got better, L4/5 disk  PAIN:  Are you having pain? Yes: NPRS scale: 7/10 Pain location: Rt knee- surrounds patella Pain description: sharp when walking, thigh is achey Aggravating factors: weight bearing Relieving factors: it is just fine in sitting    PRECAUTIONS: Fall  WEIGHT BEARING RESTRICTIONS No  FALLS:  Has patient fallen in last 6 months? 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   2.  Small suprapatellar joint effusion.   3.  Small Baker's cyst measuring approximately 2.1 x 1.0 x 3.0 cm.   4. Benign osseous lesion in the lateral tibial plateau most consistent with enchondroma or intraosseous cyst.   PATIENT SURVEYS:  FOTO 39 10/10: 36   SENSATION: Tingling anterior thigh still present  PALPATION: 10/10: Patella stays in groove until  very end range of ext and then tracks laterally  LOWER EXTREMITY ROM:     10/10: Excellent passive ROM through hip and knee continues  LOWER EXTREMITY MMT:    EVAL: Active SLR available to 4" off of table actively,  passively to 90 10/10: active SLR to 12" off table actively, passively to 90  MMT Right eval Left eval Rt/Lt 11/19/21  Hip flexion 24.1 27.5   Hip extension     Hip abduction 17.2 14.3   Hip adduction     Hip internal rotation     Hip external rotation     Knee flexion     Knee extension 46.3 42.6 40.9/40.6  Ankle dorsiflexion 29.2 28.9   Ankle plantarflexion     Ankle inversion     Ankle eversion      (Blank rows = not tested)   GAIT: 10/10: able to ambulate without antalgic pattern    TODAY'S TREATMENT   Treatment                            12/05/21:  SL abd with knees flexed- lower foot before knee SL clams, also with ball bw ankles Figure 4 Bridges over red physioball Frog curl in feet on red physioball SLR from red physioball LAQ ball bw knees- feet hover floor Rt mod thomas stretch as PT rolls quads Rt knee to Rt shoulder 2 min to encourage post rotation of Rt innom.    Treatment                            11/27/21:  Re-evaluation testing Seated round back progressed to russian twists- seated on table with heels on step; single arm diag reach 1lb Sidelying clams Sidelying hip circles- small and large  MANUAL: STM lumbar paraspinals  Treatment                            11/19/21:  Chair yoga adaptions to : seated hip flexion, hamstring stretch, lumbar traction, warrior 1 LAQ ball bw knees- no trunk support Bil LE LAQ holding ball bw heels Seated biceps curls to OH press, 5lb Bent over triceps kicks Shoulder series: small & large circles, horiz ab/add, ER/IR at 90  11/13/21  TherEx:  PPT x10 with 3 second holds  PPT with march x10  PPT with opposite UE/LE flexion to 90 x10 each side (R knee bent for pain control) On swiss ball: TrA sets x10 with 3 second holds, BUEs overhead with LAQ x10 B (limited by R LE pain) Bridge x10  U supine clamshells red TB x10 B   Manual:  Proximal quad release with tennis ball   9/18 Nustep 5 min L5 Prone quad  stretch with strap Hooklying ab set + add with ball Supine quad set with towel roll- cues for ab set; added mini SLR Lumbar shift correction- Rt sidelying over 2 pillows Roller to Rt quads in [employer] test position Hesch self correction for Lt post innom    PATIENT EDUCATION:  Education details: Geophysicist/field seismologist of condition, POC, HEP, exercise form/rationale Person educated: Patient Education method: Explanation, Demonstration, Tactile cues, Verbal cues, and Handouts Education comprehension: verbalized understanding, returned demonstration, verbal cues required, tactile cues required, and needs further education   HOME EXERCISE PROGRAM: [id number]  ASSESSMENT:  CLINICAL IMPRESSION:  Posture noted- tends to stand with NBOS and Rt  innom anteriorly rotated creating full body rotation and increased pressure to lateral tibial plateau on left side- did notice dicomfort with corrected rotation but not bad.    OBJECTIVE IMPAIRMENTS Abnormal gait, decreased activity tolerance, decreased balance, decreased endurance, decreased mobility, difficulty walking, decreased strength, increased muscle spasms, postural dysfunction, and pain.   ACTIVITY LIMITATIONS lifting, bending, standing, squatting, stairs, transfers, bathing, and locomotion level  PARTICIPATION LIMITATIONS: meal prep, cleaning, laundry, driving, shopping, and community activity  PERSONAL FACTORS 1-2 comorbidities: multiple treatments for back pain, h/o Rt THA  are also affecting patient's functional outcome.     GOALS: Goals reviewed with patient? Yes  SHORT TERM GOALS: Target date: 10/16/21  Able to demo core control with pelvic strengthening activities Baseline: Goal status: achieved   LONG TERM GOALS: Target date:POC date  Pt will meet FOTO goal Baseline:  Goal status: ongoing  2.  Able to demonstrate hip abd strength via dynamometry to meet age-appropriate functional level Baseline:  see chart Goal status:  ongoing  3.  Able to walk through grocery store before feeling limited by knee pain Baseline: 10/10: I can now go down 1 aisle and then have to go, previously was just to door Goal status: ongoing  4.  Able to demo SLR to at least 75 deg Baseline: 10/10: 25 deg Goal status: ongoing   PLAN: PT FREQUENCY: 1x/week  PT DURATION: 8 weeks  PLANNED INTERVENTIONS: Therapeutic exercises, Therapeutic activity, Neuromuscular re-education, Balance training, Gait training, Patient/Family education, Self Care, Joint mobilization, Stair training, Aquatic Therapy, Dry Needling, Electrical stimulation, Spinal mobilization, Cryotherapy, Moist heat, Taping, and Manual therapy.  PLAN FOR NEXT SESSION: core stability   Markhi Kleckner C. Claritza July PT, DPT 12/05/21 12:10 PM     Referring diagnosis?  M54.16 (ICD-10-CM) - Lumbar radiculopathy  M79.604 (ICD-10-CM) - Right leg pain  M51.36 (ICD-10-CM) - DDD (degenerative disc disease), lumbar   Treatment diagnosis? (if different than referring diagnosis) m25.561  m62.81  r26.2 What was this (referring dx) caused by? '[]'$  Surgery '[]'$  Fall '[x]'$  Ongoing issue '[]'$  Arthritis '[]'$  Other: ____________  Laterality: '[x]'$  Rt '[]'$  Lt '[]'$  Both  Check all possible CPT codes:  *CHOOSE 10 OR LESS*    '[]'$  97110 (Therapeutic Exercise)  '[]'$  92507 (SLP Treatment)  '[]'$  97112 (Neuro Re-ed)   '[]'$  92526 (Swallowing Treatment)   '[]'$  35009 (Gait Training)   '[]'$  [phone] (Cognitive Training, 1st 15 minutes) '[]'$  97140 (Manual Therapy)   '[]'$  97130 (Cognitive Training, each add'l 15 minutes)  '[]'$  97164 (Re-evaluation)                              '[]'$  Other, List CPT Code ____________  '[]'$  97530 (Therapeutic Activities)     '[]'$  97535 (Self Care)   '[x]'$  All codes above (97110 - 97535)  '[]'$  97012 (Mechanical Traction)  '[]'$  97014 (E-stim Unattended)  '[]'$  97032 (E-stim manual)  '[]'$  97033 (Ionto)  '[]'$  97035 (Ultrasound) '[x]'$  97750 (Physical Performance Training) '[x]'$  [id number] (Aquatic Therapy) '[]'$  97016  (Vasopneumatic Device) '[]'$  [id number] (Paraffin) '[]'$  97034 (Contrast Bath) '[]'$  97597 (Wound Care 1st 20 sq cm) '[]'$  97598 (Wound Care each add'l 20 sq cm) '[]'$  97760 (Orthotic Fabrication, Fitting, Training Initial) '[]'$  [id number] (Prosthetic Management and Training Initial) '[]'$  [id number] (Orthotic or Prosthetic Training/ Modification Subsequent)

## 2021-12-07 NOTE — Progress Notes (Unsigned)
Courtney [name] [address] [place] Phone: [phone] Subjective:   IVilma [name], am serving as a scribe for Dr. Hulan Saas.  I'm seeing this patient by the request  of:  [name], [name], MD  CC: Hip pain, back pain knee pain  [id number]  10/29/2021 Patient did have PRP today.  Hopefully will respond well to this.  Patient wants to avoid any surgical intervention at the moment.  If necessary will consider surgery and has seen Dr. Ninfa Linden previously.  Follow-up with me again 6 weeks otherwise.  Update 12/10/2021 ANGELLA [name] is a 71 y.o. female coming in with complaint of R knee pain. Patient states here to update on ortho visit. No other issues.  States that unfortunately does not feel that the injection made a significant improvement at this time.  Meaning that the PRP.  Patient still having difficulty with her knee and hip.  Sounded a provider and is scheduled for an MRI of the hip.  Since we have seen her she did have a bone scan done that did not show any significant instability of the hip at the moment.  Patient continues to have difficulty overall.      Past Medical History:  Diagnosis Date   Aortic valve sclerosis    echo 65/6812, soft systolic murmur   Arthritis    in right hip   Back pain    Basal cell carcinoma 05/31/2016   bcc left forehead  tx exc   Carotid artery disease (HCC)    40-59% bilateral, doppler December 2010   Cataract    Constipation    Coronary artery disease    a. BMS-mid RCA 11/2007 b. stress echo 02/2009 subtle inferior HK, lateral ST depressions with stress c. follow-up cath 02/2009: RCA stent patent, severe but stable stenosis of distal posterior lateral branch RCA. LV normal, med tx unless more sx c. ETT Myoview 08/27/12: negative for ischemia; EF 64%   Decreased hearing    Depression    Mild, August, 2012   Dyslipidemia    Easy bruising    Ejection fraction    EF normal,  catheterization, 2011 /  EF 55%, echo, 2010   Fluid overload    Mild, stable since hospitalization in the past   GERD (gastroesophageal reflux disease)    occarionally, takes Pepcid over the counter   Heart murmur    has, occasional PVC's   History of right hip replacement    Hyperlipidemia    Hypertension    Joint pain    Myocardial infarction ([place])    2009   Palpitations    PVC (premature ventricular contraction)    per prior Holter monitor   Right hip pain 12/2009   Shellfish allergy    Stress fracture    Left foot   Trouble in sleeping    Vitamin B12 deficiency    Vitamin D deficiency    Past Surgical History:  Procedure Laterality Date   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/01/2005   BREAST BIOPSY Left    carcinoid tumor removal     colon-   CARDIAC CATHETERIZATION  1/211   Patent RCA stent, stenotic PLB supplying small distribution; medically managed   CATARACT EXTRACTION, BILATERAL     CORONARY ANGIOPLASTY WITH STENT PLACEMENT  11/2007   BMS-mid RCA   DIAGNOSTIC LAPAROSCOPY     1982 for endometriosis   DILATION AND CURETTAGE OF UTERUS  2005   SKIN CANCER EXCISION  TOTAL HIP ARTHROPLASTY Right 07/08/2014   Procedure: RIGHT TOTAL HIP ARTHROPLASTY ANTERIOR APPROACH;  Surgeon: Mcarthur Rossetti, MD;  Location: [facility];  Service: Orthopedics;  Laterality: Right;   WISDOM TOOTH EXTRACTION     Social History   Socioeconomic History   Marital status: Divorced    Spouse name: Not on file   Number of children: 1   Years of education: 30   Highest education level: Not on file  Occupational History   Occupation: Retired - Retail buyer  Tobacco Use   Smoking status: Never   Smokeless tobacco: Never  Vaping Use   Vaping Use: Never used  Substance and Sexual Activity   Alcohol use: Yes    Comment: 1 glass of wine 3x/week   Drug use: No   Sexual activity: Yes  Other Topics Concern   Not on file  Social History Narrative   Right handed   Soda- sometimes   Drinks  herbal tea   Fun: Dance, garden,    Denies religious beliefs effecting health care.   Denies abuse and feels safe at home   Social Determinants of Health   Financial Resource Strain: Low Risk  (09/24/2021)   Overall Financial Resource Strain (CARDIA)    Difficulty of Paying Living Expenses: Not hard at all  Food Insecurity: No Food Insecurity (09/24/2021)   Hunger Vital Sign    Worried About Running Out of Food in the Last Year: Never true    Ran Out of Food in the Last Year: Never true  Transportation Needs: No Transportation Needs (09/24/2021)   PRAPARE - Hydrologist (Medical): No    Lack of Transportation (Non-Medical): No  Physical Activity: Inactive (09/24/2021)   Exercise Vital Sign    Days of Exercise per Week: 0 days    Minutes of Exercise per Session: 0 min  Stress: No Stress Concern Present (09/24/2021)   [place]    Feeling of Stress : Not at all  Social Connections: Moderately Isolated (09/24/2021)   Social Connection and Isolation Panel [NHANES]    Frequency of Communication with Friends and Family: Three times a week    Frequency of Social Gatherings with Friends and Family: Three times a week    Attends Religious Services: Never    Active Member of Clubs or Organizations: No    Attends Archivist Meetings: Never    Marital Status: Living with partner   Allergies  Allergen Reactions   Erythromycin Hives   Penicillins Hives    Has patient had a PCN reaction causing immediate rash, facial/tongue/throat swelling, SOB or lightheadedness with hypotension: No Has patient had a PCN reaction causing severe rash involving mucus membranes or skin necrosis: No Has patient had a PCN reaction that required hospitalization: No Has patient had a PCN reaction occurring within the last 10 years: No If all of the above answers are "NO", then may proceed with Cephalosporin use.     Shellfish Allergy Hives   Family History  Problem Relation Age of Onset   Heart attack Father 26   Hyperlipidemia Father    Hypertension Father    Sudden death Father    Stroke Mother    Hyperlipidemia Mother    Hypertension Mother    Heart disease Mother    Obesity Mother    Heart attack Paternal Grandfather    Heart disease Sister    BRCA 1/2 Neg Hx  Breast cancer Neg Hx    Esophageal cancer Neg Hx    Colon cancer Neg Hx    Pancreatic cancer Neg Hx    Liver disease Neg Hx    Stomach cancer Neg Hx     Current Outpatient Medications (Endocrine & Metabolic):    metFORMIN (GLUCOPHAGE) 500 MG tablet, Take 1 tablet (500 mg total) by mouth daily with breakfast.  Current Outpatient Medications (Cardiovascular):    Evolocumab (REPATHA SURECLICK) 466 MG/ML SOAJ, Inject 140 mg into the skin every 14 (fourteen) days.   ezetimibe (ZETIA) 10 MG tablet, Take 1 tablet (10 mg total) by mouth daily.   metoprolol tartrate (LOPRESSOR) 25 MG tablet, Take 0.5 tablets (12.5 mg total) by mouth 2 (two) times daily.   nitroGLYCERIN (NITROSTAT) 0.4 MG SL tablet, Place 1 tablet (0.4 mg total) under the tongue every 5 (five) minutes as needed for chest pain.   ramipril (ALTACE) 10 MG capsule, Take 1 capsule (10 mg total) by mouth daily.   rosuvastatin (CRESTOR) 40 MG tablet, Take 1 tablet (40 mg total) by mouth daily.   Current Outpatient Medications (Analgesics):    aspirin 81 MG tablet, Take 81 mg by mouth daily.   Current Outpatient Medications (Other):    clobetasol cream (TEMOVATE) 0.05 %, Apply 1-2 times a day to [place] for 2 weeks   dicyclomine (BENTYL) 20 MG tablet, Take 1 tablet (20 mg total) by mouth every 6 (six) hours as needed for spasms.   gabapentin (NEURONTIN) 300 MG capsule, Take 1 capsule (300 mg total) by mouth at bedtime.   pantoprazole (PROTONIX) 40 MG tablet, Take 1 tablet (40 mg total) by mouth daily.   Vitamin D, Ergocalciferol, (DRISDOL) 1.25 MG (50000 UNIT) CAPS  capsule, Take 1 capsule (50,000 Units total) by mouth every 7 (seven) days.   Reviewed prior external information including notes and imaging from  primary care provider As well as notes that were available from care everywhere and other healthcare systems.  Past medical history, social, surgical and family history all reviewed in electronic medical record.  No pertanent information unless stated regarding to the chief complaint.   Review of Systems:  No headache, visual changes, nausea, vomiting, diarrhea, constipation, dizziness, abdominal pain, skin rash, fevers, chills, night sweats, weight loss, swollen lymph nodes, body aches, joint swelling, chest pain, shortness of breath, mood changes. POSITIVE muscle aches  Objective  Blood pressure (!) 112/58, pulse 67, height 5' (1.524 m), weight 164 lb (74.4 kg), SpO2 96 %.   General: No apparent distress alert and oriented x3 mood and affect normal, dressed appropriately.  HEENT: Pupils equal, extraocular movements intact  Respiratory: Patient's speak in full sentences and does not appear short of breath  Cardiovascular: No lower extremity edema, non tender, no erythema  Antalgic gait noted.  Does have some weakness of the right leg compared to the contralateral side.  Crepitus of the patellofemoral joint still noted.  Lateral tracking of the patella noted.  Lacks last 10 degrees of flexion.  Patient's right hip have minor tenderness over the greater trochanteric area but seems to moving internal and external range of motion relatively well.  Back exam patient does seem to be uncomfortable on exam today.    Impression and Recommendations:     The above documentation has been reviewed and is accurate and complete Lyndal Pulley, DO

## 2021-12-10 ENCOUNTER — Ambulatory Visit: Payer: Medicare HMO | Admitting: Family Medicine

## 2021-12-10 ENCOUNTER — Encounter: Payer: Self-pay | Admitting: Family Medicine

## 2021-12-10 VITALS — BP 112/58 | HR 67 | Ht 60.0 in | Wt 164.0 lb

## 2021-12-10 DIAGNOSIS — M1711 Unilateral primary osteoarthritis, right knee: Secondary | ICD-10-CM | POA: Diagnosis not present

## 2021-12-10 DIAGNOSIS — M5416 Radiculopathy, lumbar region: Secondary | ICD-10-CM

## 2021-12-10 DIAGNOSIS — M1611 Unilateral primary osteoarthritis, right hip: Secondary | ICD-10-CM | POA: Diagnosis not present

## 2021-12-10 DIAGNOSIS — M5136 Other intervertebral disc degeneration, lumbar region: Secondary | ICD-10-CM | POA: Diagnosis not present

## 2021-12-10 NOTE — Assessment & Plan Note (Signed)
This is been significantly challenging for patient. Continues to have difficulty.  Is taking gabapentin.  300 mg at night.  Could consider the potential increasing again.  Patient has seen other providers as well at this point.  Patient did respond well to epidurals but they do seem to be short-lived.  Still avoid any surgical intervention if possible.  We discussed an L4-L5 epidural secondary to this and may be a medial branch block with patient's symptoms of the knee and some weakness.  Depending on how patient responds to the medial branch block we could see if she is a candidate for radiofrequency ablation but do not know if this will make a significant difference or not.  Patient would like to try something though because her quality of life is  Deteriorating.  Total time with patient 31 minutes.

## 2021-12-10 NOTE — Patient Instructions (Addendum)
[place] [phone] Call Today  Keep me updated through [place]

## 2021-12-10 NOTE — Assessment & Plan Note (Signed)
Known patellofemoral arthritis.  Did not respond well to the PRP patient has seen other providers but feels strongly that she would respond to a knee replacement at this time.  Trying to get gel approval from another facility.  I do think this is a good idea and continue bracing.  Follow-up with me as needed for this chronic problem with worsening symptoms.

## 2021-12-10 NOTE — Assessment & Plan Note (Signed)
Worsening pain, has had 1 replacement previously.  Patient concern for some instability.  I think there is a good chance of more of a lumbar radiculopathy.  Depending on how patient responds to the medial branch block with we could see if that contributes.  Otherwise patient is already following up with an orthopedic surgeon and is scheduled for an MRI at low-dose to further evaluate.

## 2021-12-11 ENCOUNTER — Encounter (HOSPITAL_BASED_OUTPATIENT_CLINIC_OR_DEPARTMENT_OTHER): Payer: Self-pay | Admitting: Physical Therapy

## 2021-12-11 ENCOUNTER — Ambulatory Visit (HOSPITAL_BASED_OUTPATIENT_CLINIC_OR_DEPARTMENT_OTHER): Payer: Medicare HMO | Admitting: Physical Therapy

## 2021-12-11 DIAGNOSIS — G8929 Other chronic pain: Secondary | ICD-10-CM | POA: Diagnosis not present

## 2021-12-11 DIAGNOSIS — M25561 Pain in right knee: Secondary | ICD-10-CM | POA: Diagnosis not present

## 2021-12-11 DIAGNOSIS — M6281 Muscle weakness (generalized): Secondary | ICD-10-CM

## 2021-12-11 DIAGNOSIS — R262 Difficulty in walking, not elsewhere classified: Secondary | ICD-10-CM

## 2021-12-11 NOTE — Therapy (Signed)
OUTPATIENT PHYSICAL THERAPY THORACOLUMBAR TREATMENT   Patient Name: Courtney [name] MRN: [id number] DOB:September 28, 1950, 71 y.o., female Today's Date: 12/11/2021   PT End of Session - 12/11/21 1019     Visit Number 9    Number of Visits 19    Date for PT Re-Evaluation 01/11/22    Authorization Type [employer] MCR    Progress Note Due on Visit 72    PT Start Time 1019    PT Stop Time 1100    PT Time Calculation (min) 41 min    Activity Tolerance Patient tolerated treatment well    Behavior During Therapy [facility] for tasks assessed/performed                  Past Medical History:  Diagnosis Date   Aortic valve sclerosis    echo 51/7616, soft systolic murmur   Arthritis    in right hip   Back pain    Basal cell carcinoma 05/31/2016   bcc left forehead  tx exc   Carotid artery disease (HCC)    40-59% bilateral, doppler December 2010   Cataract    Constipation    Coronary artery disease    a. BMS-mid RCA 11/2007 b. stress echo 02/2009 subtle inferior HK, lateral ST depressions with stress c. follow-up cath 02/2009: RCA stent patent, severe but stable stenosis of distal posterior lateral branch RCA. LV normal, med tx unless more sx c. ETT Myoview 08/27/12: negative for ischemia; EF 64%   Decreased hearing    Depression    Mild, August, 2012   Dyslipidemia    Easy bruising    Ejection fraction    EF normal, catheterization, 2011 /  EF 55%, echo, 2010   Fluid overload    Mild, stable since hospitalization in the past   GERD (gastroesophageal reflux disease)    occarionally, takes Pepcid over the counter   Heart murmur    has, occasional PVC's   History of right hip replacement    Hyperlipidemia    Hypertension    Joint pain    Myocardial infarction ([place])    2009   Palpitations    PVC (premature ventricular contraction)    per prior Holter monitor   Right hip pain 12/2009   Shellfish allergy    Stress fracture    Left foot   Trouble in sleeping    Vitamin B12  deficiency    Vitamin D deficiency    Past Surgical History:  Procedure Laterality Date   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/01/2005   BREAST BIOPSY Left    carcinoid tumor removal     colon-   CARDIAC CATHETERIZATION  1/211   Patent RCA stent, stenotic PLB supplying small distribution; medically managed   CATARACT EXTRACTION, BILATERAL     CORONARY ANGIOPLASTY WITH STENT PLACEMENT  11/2007   BMS-mid RCA   DIAGNOSTIC LAPAROSCOPY     1982 for endometriosis   DILATION AND CURETTAGE OF UTERUS  2005   SKIN CANCER EXCISION     TOTAL HIP ARTHROPLASTY Right 07/08/2014   Procedure: RIGHT TOTAL HIP ARTHROPLASTY ANTERIOR APPROACH;  Surgeon: Mcarthur Rossetti, MD;  Location: [facility];  Service: Orthopedics;  Laterality: Right;   WISDOM TOOTH EXTRACTION     Patient Active Problem List   Diagnosis Date Noted   Chronic pain of right knee 10/15/2021   Stress 08/30/2021   Prediabetes 07/11/2021   Degenerative disc disease, lumbar 05/19/2020   Restless leg syndrome 05/15/2020   Degenerative disc disease, cervical 02/02/2020  Polyarthralgia 01/03/2020   Patellofemoral arthritis of right knee 06/16/2019   Vitamin D deficiency, Vit D = 43.2 (01/25/19), Rx vit D 50K IU every 2 weeks 08/04/2018   Cyst of right breast 05/07/2018   Chronic headache disorder 02/08/2018   Essential hypertension 11/17/2017   Class 1 obesity with serious comorbidity and body mass index (BMI) of 30.0 to 30.9 in adult 07/15/2017   Insomnia 07/15/2017   Female stress incontinence 07/15/2017   Gastroesophageal reflux disease with esophagitis, Rx Protonix 02/25/2017   Osteopenia 06/27/2015   Osteoarthritis of right hip 07/08/2014   Carotid artery disease ([place])    Aortic valve sclerosis    Hyperlipidemia    Coronary artery disease     PCP: Mitchel Honour, MD  REFERRING PROVIDER: Glennon Mac, DO  REFERRING DIAG:  M54.16 (ICD-10-CM) - Lumbar radiculopathy  M79.604 (ICD-10-CM) - Right leg pain  M51.36 (ICD-10-CM) - DDD  (degenerative disc disease), lumbar    Rationale for Evaluation and Treatment Rehabilitation  THERAPY DIAG:  Chronic pain of right knee  Muscle weakness (generalized)  Difficulty in walking, not elsewhere classified  ONSET DATE: 09/01/21  SUBJECTIVE:                                                                                                                                                                                           SUBJECTIVE STATEMENT: Preparing for long drive. Plans to have injection as precursor to ablation.   PERTINENT HISTORY:  Rt THA, H/o knee pain that got better, L4/5 disk  PAIN:  Are you having pain? Yes: NPRS scale: 7/10 Pain location: Rt knee- surrounds patella Pain description: sharp when walking, thigh is achey Aggravating factors: weight bearing Relieving factors: it is just fine in sitting    PRECAUTIONS: Fall  WEIGHT BEARING RESTRICTIONS No  FALLS:  Has patient fallen in last 6 months? 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   2.  Small suprapatellar joint effusion.   3.  Small Baker's cyst measuring approximately 2.1 x 1.0 x 3.0 cm.   4. Benign osseous lesion in the lateral tibial plateau most consistent with enchondroma or intraosseous cyst.  MRI from 2022- lumbar levoscoliosis beginning at L2   PATIENT SURVEYS:  FOTO 39 10/10: 36   SENSATION: Tingling anterior thigh still present  PALPATION: 10/10: Patella stays in groove until very end range of ext and then tracks  laterally  LOWER EXTREMITY ROM:     10/10: Excellent passive ROM through hip and knee continues  LOWER EXTREMITY MMT:    EVAL: Active SLR available to 4" off of table actively,  passively to 90 10/10: active SLR to 12" off table actively, passively to 90  MMT Right eval Left eval Rt/Lt 11/19/21  Hip flexion 24.1 27.5   Hip extension     Hip abduction 17.2 14.3   Hip adduction     Hip internal rotation     Hip external rotation     Knee flexion     Knee extension 46.3 42.6 40.9/40.6  Ankle dorsiflexion 29.2 28.9   Ankle plantarflexion     Ankle inversion     Ankle eversion      (Blank rows = not tested)   GAIT: 10/10: able to ambulate without antalgic pattern    TODAY'S TREATMENT   Treatment                            12/11/21:  Prone quad set + hip ext Rt sidelying over single pillow, STM to Lt lubar paraspinals, grade 2 Lt to Rt L2-4 mobs Prone lumbar traction off of end of table- multiple in session Gait with SPC in Rt hand to improve Lt glut med/min activation in CKC of gait   Treatment                            12/05/21:  SL abd with knees flexed- lower foot before knee SL clams, also with ball bw ankles Figure 4 Bridges over red physioball Frog curl in feet on red physioball SLR from red physioball LAQ ball bw knees- feet hover floor Rt mod thomas stretch as PT rolls quads Rt knee to Rt shoulder 2 min to encourage post rotation of Rt innom.    Treatment                            11/27/21:  Re-evaluation testing Seated round back progressed to russian twists- seated on table with heels on step; single arm diag reach 1lb Sidelying clams Sidelying hip circles- small and large  MANUAL: STM lumbar paraspinals  Treatment                            11/19/21:  Chair yoga adaptions to : seated hip flexion, hamstring stretch, lumbar traction, warrior 1 LAQ ball bw knees- no trunk support Bil LE LAQ holding ball bw heels Seated biceps curls to OH press, 5lb Bent over triceps kicks Shoulder series: small & large circles, horiz ab/add, ER/IR at 90  11/13/21  TherEx:  PPT x10 with 3 second holds  PPT with march x10  PPT with  opposite UE/LE flexion to 90 x10 each side (R knee bent for pain control) On swiss ball: TrA sets x10 with 3 second holds, BUEs overhead with LAQ x10 B (limited by R LE pain) Bridge x10  U supine clamshells red TB x10 B   Manual:  Proximal quad release with tennis ball     PATIENT EDUCATION:  Education details: Anatomy of condition, POC, HEP, exercise form/rationale Person educated: Patient Education method: Explanation, Demonstration, Tactile cues, Verbal cues, and Handouts Education comprehension: verbalized understanding, returned demonstration, verbal cues required, tactile cues required, and needs further education  HOME EXERCISE PROGRAM: [id number] Rt sidelying with pillow under right side and bw knees  ASSESSMENT:  CLINICAL IMPRESSION:  Severe levoscoliosis in lumbar spine as well as LE IR at heel strike and through stance phase. Trial of SPC to improve Lt hip abductor group activation but she still felt knee pain about 2/3 of the way around the downstairs loop. Has a long drive tomorrow and we will cont to address issues when she returns.   OBJECTIVE IMPAIRMENTS Abnormal gait, decreased activity tolerance, decreased balance, decreased endurance, decreased mobility, difficulty walking, decreased strength, increased muscle spasms, postural dysfunction, and pain.   ACTIVITY LIMITATIONS lifting, bending, standing, squatting, stairs, transfers, bathing, and locomotion level  PARTICIPATION LIMITATIONS: meal prep, cleaning, laundry, driving, shopping, and community activity  PERSONAL FACTORS 1-2 comorbidities: multiple treatments for back pain, h/o Rt THA  are also affecting patient's functional outcome.     GOALS: Goals reviewed with patient? Yes  SHORT TERM GOALS: Target date: 10/16/21  Able to demo core control with pelvic strengthening activities Baseline: Goal status: achieved   LONG TERM GOALS: Target date:POC date  Pt will meet FOTO goal Baseline:  Goal  status: ongoing  2.  Able to demonstrate hip abd strength via dynamometry to meet age-appropriate functional level Baseline:  see chart Goal status: ongoing  3.  Able to walk through grocery store before feeling limited by knee pain Baseline: 10/10: I can now go down 1 aisle and then have to go, previously was just to door Goal status: ongoing  4.  Able to demo SLR to at least 75 deg Baseline: 10/10: 25 deg Goal status: ongoing   PLAN: PT FREQUENCY: 1x/week  PT DURATION: 8 weeks  PLANNED INTERVENTIONS: Therapeutic exercises, Therapeutic activity, Neuromuscular re-education, Balance training, Gait training, Patient/Family education, Self Care, Joint mobilization, Stair training, Aquatic Therapy, Dry Needling, Electrical stimulation, Spinal mobilization, Cryotherapy, Moist heat, Taping, and Manual therapy.  PLAN FOR NEXT SESSION: core stability, cont joint mobs in Rt sidelying, correct Rt LE IR at heel strike  Guage Efferson C. Mayling Aber PT, DPT 12/11/21 11:08 AM     Referring diagnosis?  M54.16 (ICD-10-CM) - Lumbar radiculopathy  M79.604 (ICD-10-CM) - Right leg pain  M51.36 (ICD-10-CM) - DDD (degenerative disc disease), lumbar   Treatment diagnosis? (if different than referring diagnosis) m25.561  m62.81  r26.2 What was this (referring dx) caused by? '[]'$  Surgery '[]'$  Fall '[x]'$  Ongoing issue '[]'$  Arthritis '[]'$  Other: ____________  Laterality: '[x]'$  Rt '[]'$  Lt '[]'$  Both  Check all possible CPT codes:  *CHOOSE 10 OR LESS*    '[]'$  97110 (Therapeutic Exercise)  '[]'$  92507 (SLP Treatment)  '[]'$  97112 (Neuro Re-ed)   '[]'$  92526 (Swallowing Treatment)   '[]'$  97116 (Gait Training)   '[]'$  [id number] (Cognitive Training, 1st 15 minutes) '[]'$  97140 (Manual Therapy)   '[]'$  97130 (Cognitive Training, each add'l 15 minutes)  '[]'$  97164 (Re-evaluation)                              '[]'$  Other, List CPT Code ____________  '[]'$  97530 (Therapeutic Activities)     '[]'$  97535 (Self Care)   '[x]'$  All codes above (97110 - 97535)  '[]'$   97012 (Mechanical Traction)  '[]'$  97014 (E-stim Unattended)  '[]'$  97032 (E-stim manual)  '[]'$  97033 (Ionto)  '[]'$  97035 (Ultrasound) '[x]'$  97750 (Physical Performance Training) '[x]'$  [id number] (Aquatic Therapy) '[]'$  97016 (Vasopneumatic Device) '[]'$  [id number] (Paraffin) '[]'$  97034 (Contrast Bath) '[]'$  97597 (Wound Care 1st 20 sq cm) '[]'$   [id number] (Wound Care each add'l 20 sq cm) '[]'$  97760 (Orthotic Fabrication, Fitting, Training Initial) '[]'$  [id number] (Prosthetic Management and Training Initial) '[]'$  [id number] (Orthotic or Prosthetic Training/ Modification Subsequent)

## 2021-12-13 ENCOUNTER — Other Ambulatory Visit: Payer: Medicare HMO

## 2021-12-18 ENCOUNTER — Ambulatory Visit (HOSPITAL_BASED_OUTPATIENT_CLINIC_OR_DEPARTMENT_OTHER): Payer: Medicare HMO | Admitting: Physical Therapy

## 2021-12-18 ENCOUNTER — Encounter (HOSPITAL_BASED_OUTPATIENT_CLINIC_OR_DEPARTMENT_OTHER): Payer: Self-pay | Admitting: Physical Therapy

## 2021-12-18 DIAGNOSIS — G8929 Other chronic pain: Secondary | ICD-10-CM

## 2021-12-18 DIAGNOSIS — M1711 Unilateral primary osteoarthritis, right knee: Secondary | ICD-10-CM | POA: Diagnosis not present

## 2021-12-18 DIAGNOSIS — R262 Difficulty in walking, not elsewhere classified: Secondary | ICD-10-CM | POA: Diagnosis not present

## 2021-12-18 DIAGNOSIS — M6281 Muscle weakness (generalized): Secondary | ICD-10-CM | POA: Diagnosis not present

## 2021-12-18 DIAGNOSIS — M25561 Pain in right knee: Secondary | ICD-10-CM | POA: Diagnosis not present

## 2021-12-18 NOTE — Therapy (Signed)
OUTPATIENT PHYSICAL THERAPY THORACOLUMBAR TREATMENT   Patient Name: Courtney [name] MRN: [id number] DOB:Aug 24, 1950, 71 y.o., female Today's Date: 12/18/2021   PT End of Session - 12/18/21 1100     Visit Number 10    Number of Visits 19    Date for PT Re-Evaluation 01/11/22    Authorization Type [employer] MCR    Progress Note Due on Visit 18    PT Start Time 1100    PT Stop Time 1140    PT Time Calculation (min) 40 min    Activity Tolerance Patient tolerated treatment well    Behavior During Therapy [facility] for tasks assessed/performed                   Past Medical History:  Diagnosis Date   Aortic valve sclerosis    echo 97/6734, soft systolic murmur   Arthritis    in right hip   Back pain    Basal cell carcinoma 05/31/2016   bcc left forehead  tx exc   Carotid artery disease (HCC)    40-59% bilateral, doppler December 2010   Cataract    Constipation    Coronary artery disease    a. BMS-mid RCA 11/2007 b. stress echo 02/2009 subtle inferior HK, lateral ST depressions with stress c. follow-up cath 02/2009: RCA stent patent, severe but stable stenosis of distal posterior lateral branch RCA. LV normal, med tx unless more sx c. ETT Myoview 08/27/12: negative for ischemia; EF 64%   Decreased hearing    Depression    Mild, August, 2012   Dyslipidemia    Easy bruising    Ejection fraction    EF normal, catheterization, 2011 /  EF 55%, echo, 2010   Fluid overload    Mild, stable since hospitalization in the past   GERD (gastroesophageal reflux disease)    occarionally, takes Pepcid over the counter   Heart murmur    has, occasional PVC's   History of right hip replacement    Hyperlipidemia    Hypertension    Joint pain    Myocardial infarction ([place])    2009   Palpitations    PVC (premature ventricular contraction)    per prior Holter monitor   Right hip pain 12/2009   Shellfish allergy    Stress fracture    Left foot   Trouble in sleeping    Vitamin B12  deficiency    Vitamin D deficiency    Past Surgical History:  Procedure Laterality Date   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/01/2005   BREAST BIOPSY Left    carcinoid tumor removal     colon-   CARDIAC CATHETERIZATION  1/211   Patent RCA stent, stenotic PLB supplying small distribution; medically managed   CATARACT EXTRACTION, BILATERAL     CORONARY ANGIOPLASTY WITH STENT PLACEMENT  11/2007   BMS-mid RCA   DIAGNOSTIC LAPAROSCOPY     1982 for endometriosis   DILATION AND CURETTAGE OF UTERUS  2005   SKIN CANCER EXCISION     TOTAL HIP ARTHROPLASTY Right 07/08/2014   Procedure: RIGHT TOTAL HIP ARTHROPLASTY ANTERIOR APPROACH;  Surgeon: Mcarthur Rossetti, MD;  Location: [facility];  Service: Orthopedics;  Laterality: Right;   WISDOM TOOTH EXTRACTION     Patient Active Problem List   Diagnosis Date Noted   Chronic pain of right knee 10/15/2021   Stress 08/30/2021   Prediabetes 07/11/2021   Degenerative disc disease, lumbar 05/19/2020   Restless leg syndrome 05/15/2020   Degenerative disc disease, cervical  02/02/2020   Polyarthralgia 01/03/2020   Patellofemoral arthritis of right knee 06/16/2019   Vitamin D deficiency, Vit D = 43.2 (01/25/19), Rx vit D 50K IU every 2 weeks 08/04/2018   Cyst of right breast 05/07/2018   Chronic headache disorder 02/08/2018   Essential hypertension 11/17/2017   Class 1 obesity with serious comorbidity and body mass index (BMI) of 30.0 to 30.9 in adult 07/15/2017   Insomnia 07/15/2017   Female stress incontinence 07/15/2017   Gastroesophageal reflux disease with esophagitis, Rx Protonix 02/25/2017   Osteopenia 06/27/2015   Osteoarthritis of right hip 07/08/2014   Carotid artery disease ([place])    Aortic valve sclerosis    Hyperlipidemia    Coronary artery disease     PCP: Mitchel Honour, MD  REFERRING PROVIDER: Glennon Mac, DO  REFERRING DIAG:  M54.16 (ICD-10-CM) - Lumbar radiculopathy  M79.604 (ICD-10-CM) - Right leg pain  M51.36 (ICD-10-CM) - DDD  (degenerative disc disease), lumbar    Rationale for Evaluation and Treatment Rehabilitation  THERAPY DIAG:  Chronic pain of right knee  Muscle weakness (generalized)  Difficulty in walking, not elsewhere classified  ONSET DATE: 09/01/21  SUBJECTIVE:                                                                                                                                                                                           SUBJECTIVE STATEMENT: Trip went okay. First gel injection at 4 today. Put a pause on injection to back until hip MRI. Stil cannot make the loop around [employer].    PERTINENT HISTORY:  Rt THA, H/o knee pain that got better, L4/5 disk  PAIN:  Are you having pain? Yes: NPRS scale: 7/10 Pain location: Rt knee- surrounds patella Pain description: sharp when walking Aggravating factors: weight bearing Relieving factors: it is just fine in sitting    PRECAUTIONS: Fall  WEIGHT BEARING RESTRICTIONS No  FALLS:  Has patient fallen in last 6 months? 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   2.  Small suprapatellar joint effusion.   3.  Small Baker's cyst measuring approximately 2.1 x 1.0 x 3.0 cm.   4. Benign osseous lesion in the lateral tibial plateau most consistent with enchondroma or intraosseous cyst.  MRI from 2022- lumbar levoscoliosis beginning at L2   PATIENT SURVEYS:  FOTO 39 10/10: 36   SENSATION: Tingling anterior thigh still present  PALPATION: 10/10: Patella stays in groove until very end range of ext and then tracks laterally  LOWER EXTREMITY ROM:     10/10: Excellent passive ROM through hip and knee continues  LOWER EXTREMITY MMT:    EVAL: Active SLR  available to 4" off of table actively, passively to 90 10/10: active SLR to 12" off table actively, passively to 90  MMT Right eval Left eval Rt/Lt 11/19/21  Hip flexion 24.1 27.5   Hip extension     Hip abduction 17.2 14.3   Hip adduction     Hip internal rotation     Hip external rotation     Knee flexion     Knee extension 46.3 42.6 40.9/40.6  Ankle dorsiflexion 29.2 28.9   Ankle plantarflexion     Ankle inversion     Ankle eversion      (Blank rows = not tested)   GAIT: 10/10: able to ambulate without antalgic pattern    TODAY'S TREATMENT   Treatment                            12/17/21:  Hooklying ab set + leg ext with SLR Supine LE circles Left sidelying- clams, 90 hip abd Prone HS curls + glut set + hip ext Prone bil HS curl with ab set Seated hip hinge Heel raises 5 lifts to 5s hold x5 Gastroc stretch Standing hip ext at bar in plank Seated hamstring & adductor stretch   Treatment                            12/11/21:  Prone quad set + hip ext Rt sidelying over single pillow, STM to Lt lubar paraspinals, grade 2 Lt to Rt L2-4 mobs Prone lumbar traction off of end of table- multiple in session Gait with SPC in Rt hand to improve Lt glut med/min activation in CKC of gait   Treatment                            12/05/21:  SL abd with knees flexed- lower foot before knee SL clams, also with ball bw ankles Figure 4 Bridges over red physioball Frog curl in feet on red physioball SLR from red physioball LAQ ball bw knees- feet hover floor Rt mod thomas stretch as PT rolls quads Rt knee to Rt shoulder 2 min to encourage post rotation of Rt innom.      PATIENT EDUCATION:  Education details: Geophysicist/field seismologist of condition, POC, HEP, exercise form/rationale Person educated: Patient Education method: Explanation, Demonstration, Tactile cues, Verbal cues, and Handouts Education comprehension: verbalized understanding, returned demonstration, verbal cues required,  tactile cues required, and needs further education   HOME EXERCISE PROGRAM: [id number] Rt sidelying with pillow under right side and bw knees  ASSESSMENT:  CLINICAL IMPRESSION:  Excellent tolerance to [place] exercises today and able to reduce knots through quads with rolling. Will see how she does with gel injection in knee.   OBJECTIVE IMPAIRMENTS Abnormal gait, decreased activity tolerance, decreased balance, decreased endurance, decreased mobility, difficulty walking, decreased strength, increased muscle spasms, postural dysfunction, and pain.   ACTIVITY LIMITATIONS lifting, bending, standing, squatting, stairs, transfers, bathing, and locomotion level  PARTICIPATION LIMITATIONS: meal prep, cleaning, laundry, driving, shopping, and community activity  PERSONAL FACTORS 1-2 comorbidities: multiple treatments for back pain, h/o Rt THA  are also affecting patient's functional  outcome.     GOALS: Goals reviewed with patient? Yes  SHORT TERM GOALS: Target date: 10/16/21  Able to demo core control with pelvic strengthening activities Baseline: Goal status: achieved   LONG TERM GOALS: Target date:POC date  Pt will meet FOTO goal Baseline:  Goal status: ongoing  2.  Able to demonstrate hip abd strength via dynamometry to meet age-appropriate functional level Baseline:  see chart Goal status: ongoing  3.  Able to walk through grocery store before feeling limited by knee pain Baseline: 10/10: I can now go down 1 aisle and then have to go, previously was just to door Goal status: ongoing  4.  Able to demo SLR to at least 75 deg Baseline: 10/10: 25 deg Goal status: ongoing   PLAN: PT FREQUENCY: 1x/week  PT DURATION: 8 weeks  PLANNED INTERVENTIONS: Therapeutic exercises, Therapeutic activity, Neuromuscular re-education, Balance training, Gait training, Patient/Family education, Self Care, Joint mobilization, Stair training, Aquatic Therapy, Dry Needling, Electrical  stimulation, Spinal mobilization, Cryotherapy, Moist heat, Taping, and Manual therapy.  PLAN FOR NEXT SESSION: core stability, cont joint mobs in Rt sidelying, correct Rt LE IR at heel strike  Lakyia Behe C. Reynalda Canny PT, DPT 12/18/21 11:48 AM     Referring diagnosis?  M54.16 (ICD-10-CM) - Lumbar radiculopathy  M79.604 (ICD-10-CM) - Right leg pain  M51.36 (ICD-10-CM) - DDD (degenerative disc disease), lumbar   Treatment diagnosis? (if different than referring diagnosis) m25.561  m62.81  r26.2 What was this (referring dx) caused by? '[]'$  Surgery '[]'$  Fall '[x]'$  Ongoing issue '[]'$  Arthritis '[]'$  Other: ____________  Laterality: '[x]'$  Rt '[]'$  Lt '[]'$  Both  Check all possible CPT codes:  *CHOOSE 10 OR LESS*    '[]'$  97110 (Therapeutic Exercise)  '[]'$  92507 (SLP Treatment)  '[]'$  97112 (Neuro Re-ed)   '[]'$  92526 (Swallowing Treatment)   '[]'$  97116 (Gait Training)   '[]'$  [id number] (Cognitive Training, 1st 15 minutes) '[]'$  97140 (Manual Therapy)   '[]'$  97130 (Cognitive Training, each add'l 15 minutes)  '[]'$  97164 (Re-evaluation)                              '[]'$  Other, List CPT Code ____________  '[]'$  97530 (Therapeutic Activities)     '[]'$  97535 (Self Care)   '[x]'$  All codes above (97110 - 97535)  '[]'$  97012 (Mechanical Traction)  '[]'$  97014 (E-stim Unattended)  '[]'$  97032 (E-stim manual)  '[]'$  97033 (Ionto)  '[]'$  97035 (Ultrasound) '[x]'$  97750 (Physical Performance Training) '[x]'$  [id number] (Aquatic Therapy) '[]'$  97016 (Vasopneumatic Device) '[]'$  [id number] (Paraffin) '[]'$  97034 (Contrast Bath) '[]'$  97597 (Wound Care 1st 20 sq cm) '[]'$  97598 (Wound Care each add'l 20 sq cm) '[]'$  97760 (Orthotic Fabrication, Fitting, Training Initial) '[]'$  [id number] (Prosthetic Management and Training Initial) '[]'$  [id number] (Orthotic or Prosthetic Training/ Modification Subsequent)

## 2021-12-21 DIAGNOSIS — M79604 Pain in right leg: Secondary | ICD-10-CM | POA: Diagnosis not present

## 2021-12-22 ENCOUNTER — Other Ambulatory Visit: Payer: Self-pay | Admitting: Cardiology

## 2021-12-24 ENCOUNTER — Other Ambulatory Visit: Payer: Medicare HMO

## 2021-12-25 ENCOUNTER — Encounter (HOSPITAL_BASED_OUTPATIENT_CLINIC_OR_DEPARTMENT_OTHER): Payer: Self-pay | Admitting: Physical Therapy

## 2021-12-25 ENCOUNTER — Ambulatory Visit (HOSPITAL_BASED_OUTPATIENT_CLINIC_OR_DEPARTMENT_OTHER): Payer: Medicare HMO | Attending: Sports Medicine | Admitting: Physical Therapy

## 2021-12-25 DIAGNOSIS — R262 Difficulty in walking, not elsewhere classified: Secondary | ICD-10-CM | POA: Diagnosis not present

## 2021-12-25 DIAGNOSIS — M6281 Muscle weakness (generalized): Secondary | ICD-10-CM | POA: Diagnosis not present

## 2021-12-25 DIAGNOSIS — M1711 Unilateral primary osteoarthritis, right knee: Secondary | ICD-10-CM | POA: Diagnosis not present

## 2021-12-25 DIAGNOSIS — M25561 Pain in right knee: Secondary | ICD-10-CM | POA: Diagnosis not present

## 2021-12-25 DIAGNOSIS — G8929 Other chronic pain: Secondary | ICD-10-CM | POA: Insufficient documentation

## 2021-12-25 NOTE — Therapy (Signed)
OUTPATIENT PHYSICAL THERAPY THORACOLUMBAR TREATMENT   Patient Name: Courtney [name] MRN: [id number] DOB:[birth year], 71 y.o., female Today's Date: 12/25/2021   PT End of Session - 12/25/21 1104     Visit Number 11    Number of Visits 19    Date for PT Re-Evaluation 01/11/22    Authorization Type [employer] MCR    Progress Note Due on Visit 62    PT Start Time 1104    PT Stop Time 1140    PT Time Calculation (min) 36 min    Activity Tolerance Patient tolerated treatment well    Behavior During Therapy [facility] for tasks assessed/performed                    Past Medical History:  Diagnosis Date   Aortic valve sclerosis    echo 44/0102, soft systolic murmur   Arthritis    in right hip   Back pain    Basal cell carcinoma 05/31/2016   bcc left forehead  tx exc   Carotid artery disease (HCC)    40-59% bilateral, doppler December 2010   Cataract    Constipation    Coronary artery disease    a. BMS-mid RCA 11/2007 b. stress echo 02/2009 subtle inferior HK, lateral ST depressions with stress c. follow-up cath 02/2009: RCA stent patent, severe but stable stenosis of distal posterior lateral branch RCA. LV normal, med tx unless more sx c. ETT Myoview 08/27/12: negative for ischemia; EF 64%   Decreased hearing    Depression    Mild, August, 2012   Dyslipidemia    Easy bruising    Ejection fraction    EF normal, catheterization, 2011 /  EF 55%, echo, 2010   Fluid overload    Mild, stable since hospitalization in the past   GERD (gastroesophageal reflux disease)    occarionally, takes Pepcid over the counter   Heart murmur    has, occasional PVC's   History of right hip replacement    Hyperlipidemia    Hypertension    Joint pain    Myocardial infarction ([place])    2009   Palpitations    PVC (premature ventricular contraction)    per prior Holter monitor   Right hip pain 12/2009   Shellfish allergy    Stress fracture    Left foot   Trouble in sleeping    Vitamin B12  deficiency    Vitamin D deficiency    Past Surgical History:  Procedure Laterality Date   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/01/2005   BREAST BIOPSY Left    carcinoid tumor removal     colon-   CARDIAC CATHETERIZATION  1/211   Patent RCA stent, stenotic PLB supplying small distribution; medically managed   CATARACT EXTRACTION, BILATERAL     CORONARY ANGIOPLASTY WITH STENT PLACEMENT  11/2007   BMS-mid RCA   DIAGNOSTIC LAPAROSCOPY     1982 for endometriosis   DILATION AND CURETTAGE OF UTERUS  2005   SKIN CANCER EXCISION     TOTAL HIP ARTHROPLASTY Right 07/08/2014   Procedure: RIGHT TOTAL HIP ARTHROPLASTY ANTERIOR APPROACH;  Surgeon: Mcarthur Rossetti, MD;  Location: [facility];  Service: Orthopedics;  Laterality: Right;   WISDOM TOOTH EXTRACTION     Patient Active Problem List   Diagnosis Date Noted   Chronic pain of right knee 10/15/2021   Stress 08/30/2021   Prediabetes 07/11/2021   Degenerative disc disease, lumbar 05/19/2020   Restless leg syndrome 05/15/2020   Degenerative disc disease,  cervical 02/02/2020   Polyarthralgia 01/03/2020   Patellofemoral arthritis of right knee 06/16/2019   Vitamin D deficiency, Vit D = 43.2 (01/25/19), Rx vit D 50K IU every 2 weeks 08/04/2018   Cyst of right breast 05/07/2018   Chronic headache disorder 02/08/2018   Essential hypertension 11/17/2017   Class 1 obesity with serious comorbidity and body mass index (BMI) of 30.0 to 30.9 in adult 07/15/2017   Insomnia 07/15/2017   Female stress incontinence 07/15/2017   Gastroesophageal reflux disease with esophagitis, Rx Protonix 02/25/2017   Osteopenia 06/27/2015   Osteoarthritis of right hip 07/08/2014   Carotid artery disease ([place])    Aortic valve sclerosis    Hyperlipidemia    Coronary artery disease     PCP: Mitchel Honour, MD  REFERRING PROVIDER: Glennon Mac, DO  REFERRING DIAG:  M54.16 (ICD-10-CM) - Lumbar radiculopathy  M79.604 (ICD-10-CM) - Right leg pain  M51.36 (ICD-10-CM) - DDD  (degenerative disc disease), lumbar    Rationale for Evaluation and Treatment Rehabilitation  THERAPY DIAG:  Chronic pain of right knee  Muscle weakness (generalized)  Difficulty in walking, not elsewhere classified  ONSET DATE: 09/01/21  SUBJECTIVE:                                                                                                                                                                                           SUBJECTIVE STATEMENT: Second injection later today. Had knee MRI on Friday but no results. All the same pain returned, hopefully a cumulative effect. Walked around the yard picking up annuals which hurt but I kept going.   PERTINENT HISTORY:  Rt THA, H/o knee pain that got better, L4/5 disk  PAIN:  Are you having pain? Yes: NPRS scale: 7/10 Pain location: Rt knee- surrounds patella Pain description: sharp when walking Aggravating factors: weight bearing Relieving factors: it is just fine in sitting    PRECAUTIONS: Fall  WEIGHT BEARING RESTRICTIONS No  FALLS:  Has patient fallen in last 6 months? 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   2.  Small suprapatellar joint effusion.   3.  Small Baker's cyst measuring approximately 2.1 x 1.0 x 3.0 cm.   4. Benign osseous lesion in the lateral tibial plateau most consistent with enchondroma or intraosseous cyst.  MRI from 2022- lumbar levoscoliosis beginning at L2   PATIENT SURVEYS:  FOTO 39 10/10: 36  SENSATION: Tingling anterior thigh still present  PALPATION: 10/10: Patella stays in groove until very end range of ext and then tracks laterally  LOWER EXTREMITY ROM:     10/10: Excellent passive ROM through hip  and knee continues  LOWER EXTREMITY MMT:    EVAL: Active SLR available to 4" off of table actively, passively to 90 10/10: active SLR to 12" off table actively, passively to 90  MMT Right eval Left eval Rt/Lt 11/19/21  Hip flexion 24.1 27.5   Hip extension     Hip abduction 17.2 14.3   Hip adduction     Hip internal rotation     Hip external rotation     Knee flexion     Knee extension 46.3 42.6 40.9/40.6  Ankle dorsiflexion 29.2 28.9   Ankle plantarflexion     Ankle inversion     Ankle eversion      (Blank rows = not tested)   GAIT: 10/10: able to ambulate without antalgic pattern    TODAY'S TREATMENT   Treatment                            12/25/21:  Supine leg circles with opp LE foot flat IASTM Rt quads & ITB Glut progression- bent knee hip ext, extended knee hip ext, ext knee hip ext + hip abd Bridge with feet on wall Clam with feet on wall, ball bw ankles Figure 4 stretch from wall   Treatment                            12/17/21:  Hooklying ab set + leg ext with SLR Supine LE circles Left sidelying- clams, 90 hip abd Prone HS curls + glut set + hip ext Prone bil HS curl with ab set Seated hip hinge Heel raises 5 lifts to 5s hold x5 Gastroc stretch Standing hip ext at bar in plank Seated hamstring & adductor stretch   Treatment                            12/11/21:  Prone quad set + hip ext Rt sidelying over single pillow, STM to Lt lubar paraspinals, grade 2 Lt to Rt L2-4 mobs Prone lumbar traction off of end of table- multiple in session Gait with SPC in Rt hand to improve Lt glut med/min activation in CKC of gait      PATIENT EDUCATION:  Education details: Geophysicist/field seismologist of condition, POC, HEP, exercise form/rationale Person educated: Patient Education method: Consulting civil engineer, Demonstration, Tactile cues, Verbal cues, and Handouts Education comprehension: verbalized understanding, returned demonstration, verbal cues required, tactile cues required, and  needs further education   HOME EXERCISE PROGRAM: [id number] Rt sidelying with pillow under right side and bw knees  ASSESSMENT:  CLINICAL IMPRESSION:  Continued with exercises to focus on decreased pressure directly through knee. Did well with IASTM to quads and hip flexors. Will progress as appropriate after next injection.   OBJECTIVE IMPAIRMENTS Abnormal gait, decreased activity tolerance, decreased balance, decreased endurance, decreased mobility, difficulty walking, decreased strength, increased muscle spasms, postural dysfunction, and pain.   ACTIVITY LIMITATIONS lifting, bending, standing, squatting, stairs, transfers, bathing, and locomotion level  PARTICIPATION LIMITATIONS: meal prep, cleaning, laundry, driving, shopping, and community activity  PERSONAL FACTORS 1-2 comorbidities: multiple treatments for back pain, h/o Rt THA  are also affecting patient's functional outcome.  GOALS: Goals reviewed with patient? Yes  SHORT TERM GOALS: Target date: 10/16/21  Able to demo core control with pelvic strengthening activities Baseline: Goal status: achieved   LONG TERM GOALS: Target date:POC date  Pt will meet FOTO goal Baseline:  Goal status: ongoing  2.  Able to demonstrate hip abd strength via dynamometry to meet age-appropriate functional level Baseline:  see chart Goal status: ongoing  3.  Able to walk through grocery store before feeling limited by knee pain Baseline: 10/10: I can now go down 1 aisle and then have to go, previously was just to door Goal status: ongoing  4.  Able to demo SLR to at least 75 deg Baseline: 10/10: 25 deg Goal status: ongoing   PLAN: PT FREQUENCY: 1x/week  PT DURATION: 8 weeks  PLANNED INTERVENTIONS: Therapeutic exercises, Therapeutic activity, Neuromuscular re-education, Balance training, Gait training, Patient/Family education, Self Care, Joint mobilization, Stair training, Aquatic Therapy, Dry Needling, Electrical  stimulation, Spinal mobilization, Cryotherapy, Moist heat, Taping, and Manual therapy.  PLAN FOR NEXT SESSION: core stability, cont joint mobs in Rt sidelying, correct Rt LE IR at heel strike  Latysha Thackston C. Karrah Mangini PT, DPT 12/25/21 11:54 AM     Referring diagnosis?  M54.16 (ICD-10-CM) - Lumbar radiculopathy  M79.604 (ICD-10-CM) - Right leg pain  M51.36 (ICD-10-CM) - DDD (degenerative disc disease), lumbar   Treatment diagnosis? (if different than referring diagnosis) m25.561  m62.81  r26.2 What was this (referring dx) caused by? '[]'$  Surgery '[]'$  Fall '[x]'$  Ongoing issue '[]'$  Arthritis '[]'$  Other: ____________  Laterality: '[x]'$  Rt '[]'$  Lt '[]'$  Both  Check all possible CPT codes:  *CHOOSE 10 OR LESS*    '[]'$  97110 (Therapeutic Exercise)  '[]'$  92507 (SLP Treatment)  '[]'$  97112 (Neuro Re-ed)   '[]'$  92526 (Swallowing Treatment)   '[]'$  97116 (Gait Training)   '[]'$  [id number] (Cognitive Training, 1st 15 minutes) '[]'$  97140 (Manual Therapy)   '[]'$  97130 (Cognitive Training, each add'l 15 minutes)  '[]'$  97164 (Re-evaluation)                              '[]'$  Other, List CPT Code ____________  '[]'$  97530 (Therapeutic Activities)     '[]'$  97535 (Self Care)   '[x]'$  All codes above (97110 - 97535)  '[]'$  97012 (Mechanical Traction)  '[]'$  97014 (E-stim Unattended)  '[]'$  97032 (E-stim manual)  '[]'$  97033 (Ionto)  '[]'$  97035 (Ultrasound) '[x]'$  97750 (Physical Performance Training) '[x]'$  [id number] (Aquatic Therapy) '[]'$  97016 (Vasopneumatic Device) '[]'$  32122 (Paraffin) '[]'$  97034 (Contrast Bath) '[]'$  97597 (Wound Care 1st 20 sq cm) '[]'$  97598 (Wound Care each add'l 20 sq cm) '[]'$  97760 (Orthotic Fabrication, Fitting, Training Initial) '[]'$  [id number] (Prosthetic Management and Training Initial) '[]'$  [id number] (Orthotic or Prosthetic Training/ Modification Subsequent)

## 2021-12-31 ENCOUNTER — Ambulatory Visit (INDEPENDENT_AMBULATORY_CARE_PROVIDER_SITE_OTHER): Payer: Medicare HMO | Admitting: Family Medicine

## 2021-12-31 ENCOUNTER — Encounter (INDEPENDENT_AMBULATORY_CARE_PROVIDER_SITE_OTHER): Payer: Self-pay | Admitting: Family Medicine

## 2021-12-31 VITALS — BP 128/65 | HR 45 | Temp 97.6°F | Ht 60.0 in | Wt 159.0 lb

## 2021-12-31 DIAGNOSIS — M1712 Unilateral primary osteoarthritis, left knee: Secondary | ICD-10-CM | POA: Diagnosis not present

## 2021-12-31 DIAGNOSIS — Z6831 Body mass index (BMI) 31.0-31.9, adult: Secondary | ICD-10-CM

## 2021-12-31 DIAGNOSIS — Z96641 Presence of right artificial hip joint: Secondary | ICD-10-CM | POA: Diagnosis not present

## 2021-12-31 DIAGNOSIS — R7303 Prediabetes: Secondary | ICD-10-CM

## 2021-12-31 DIAGNOSIS — E559 Vitamin D deficiency, unspecified: Secondary | ICD-10-CM | POA: Diagnosis not present

## 2021-12-31 DIAGNOSIS — E669 Obesity, unspecified: Secondary | ICD-10-CM

## 2021-12-31 MED ORDER — METFORMIN HCL 500 MG PO TABS: 500.0000 mg | ORAL_TABLET | Freq: Every day | ORAL | 0 refills | Status: DC

## 2021-12-31 MED ORDER — VITAMIN D (ERGOCALCIFEROL) 1.25 MG (50000 UNIT) PO CAPS: 50000.0000 [IU] | ORAL_CAPSULE | ORAL | 0 refills | Status: DC

## 2022-01-01 ENCOUNTER — Ambulatory Visit (HOSPITAL_BASED_OUTPATIENT_CLINIC_OR_DEPARTMENT_OTHER): Payer: Medicare HMO | Admitting: Physical Therapy

## 2022-01-01 ENCOUNTER — Encounter (HOSPITAL_BASED_OUTPATIENT_CLINIC_OR_DEPARTMENT_OTHER): Payer: Self-pay | Admitting: Physical Therapy

## 2022-01-01 DIAGNOSIS — M1711 Unilateral primary osteoarthritis, right knee: Secondary | ICD-10-CM | POA: Diagnosis not present

## 2022-01-01 DIAGNOSIS — M25561 Pain in right knee: Secondary | ICD-10-CM | POA: Diagnosis not present

## 2022-01-01 DIAGNOSIS — M6281 Muscle weakness (generalized): Secondary | ICD-10-CM | POA: Diagnosis not present

## 2022-01-01 DIAGNOSIS — G8929 Other chronic pain: Secondary | ICD-10-CM

## 2022-01-01 DIAGNOSIS — R262 Difficulty in walking, not elsewhere classified: Secondary | ICD-10-CM

## 2022-01-01 NOTE — Therapy (Signed)
OUTPATIENT PHYSICAL THERAPY THORACOLUMBAR TREATMENT   Patient Name: Courtney [name] MRN: [id number] DOB:1950/12/26, 71 y.o., female Today's Date: 01/01/2022   PT End of Session - 01/01/22 1100     Visit Number 12    Number of Visits 19    Date for PT Re-Evaluation 01/11/22    Authorization Type [employer] MCR    Progress Note Due on Visit 75    PT Start Time 1100    PT Stop Time 1120    PT Time Calculation (min) 20 min    Activity Tolerance Patient tolerated treatment well;Patient limited by pain    Behavior During Therapy [facility] for tasks assessed/performed                     Past Medical History:  Diagnosis Date   Aortic valve sclerosis    echo 74/2595, soft systolic murmur   Arthritis    in right hip   Back pain    Basal cell carcinoma 05/31/2016   bcc left forehead  tx exc   Carotid artery disease (HCC)    40-59% bilateral, doppler December 2010   Cataract    Constipation    Coronary artery disease    a. BMS-mid RCA 11/2007 b. stress echo 02/2009 subtle inferior HK, lateral ST depressions with stress c. follow-up cath 02/2009: RCA stent patent, severe but stable stenosis of distal posterior lateral branch RCA. LV normal, med tx unless more sx c. ETT Myoview 08/27/12: negative for ischemia; EF 64%   Decreased hearing    Depression    Mild, August, 2012   Dyslipidemia    Easy bruising    Ejection fraction    EF normal, catheterization, 2011 /  EF 55%, echo, 2010   Fluid overload    Mild, stable since hospitalization in the past   GERD (gastroesophageal reflux disease)    occarionally, takes Pepcid over the counter   Heart murmur    has, occasional PVC's   History of right hip replacement    Hyperlipidemia    Hypertension    Joint pain    Myocardial infarction ([place])    2009   Palpitations    PVC (premature ventricular contraction)    per prior Holter monitor   Right hip pain 12/2009   Shellfish allergy    Stress fracture    Left foot   Trouble in  sleeping    Vitamin B12 deficiency    Vitamin D deficiency    Past Surgical History:  Procedure Laterality Date   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/01/2005   BREAST BIOPSY Left    carcinoid tumor removal     colon-   CARDIAC CATHETERIZATION  1/211   Patent RCA stent, stenotic PLB supplying small distribution; medically managed   CATARACT EXTRACTION, BILATERAL     CORONARY ANGIOPLASTY WITH STENT PLACEMENT  11/2007   BMS-mid RCA   DIAGNOSTIC LAPAROSCOPY     1982 for endometriosis   DILATION AND CURETTAGE OF UTERUS  2005   SKIN CANCER EXCISION     TOTAL HIP ARTHROPLASTY Right 07/08/2014   Procedure: RIGHT TOTAL HIP ARTHROPLASTY ANTERIOR APPROACH;  Surgeon: Mcarthur Rossetti, MD;  Location: [facility];  Service: Orthopedics;  Laterality: Right;   WISDOM TOOTH EXTRACTION     Patient Active Problem List   Diagnosis Date Noted   Chronic pain of right knee 10/15/2021   Stress 08/30/2021   Prediabetes 07/11/2021   Degenerative disc disease, lumbar 05/19/2020   Restless leg syndrome 05/15/2020  Degenerative disc disease, cervical 02/02/2020   Polyarthralgia 01/03/2020   Patellofemoral arthritis of right knee 06/16/2019   Vitamin D deficiency, Vit D = 43.2 (01/25/19), Rx vit D 50K IU every 2 weeks 08/04/2018   Cyst of right breast 05/07/2018   Chronic headache disorder 02/08/2018   Essential hypertension 11/17/2017   Class 1 obesity with serious comorbidity and body mass index (BMI) of 30.0 to 30.9 in adult 07/15/2017   Insomnia 07/15/2017   Female stress incontinence 07/15/2017   Gastroesophageal reflux disease with esophagitis, Rx Protonix 02/25/2017   Osteopenia 06/27/2015   Osteoarthritis of right hip 07/08/2014   Carotid artery disease ([place])    Aortic valve sclerosis    Hyperlipidemia    Coronary artery disease     PCP: Mitchel Honour, MD  REFERRING PROVIDER: Glennon Mac, DO  REFERRING DIAG:  M54.16 (ICD-10-CM) - Lumbar radiculopathy  M79.604 (ICD-10-CM) - Right leg pain  M51.36  (ICD-10-CM) - DDD (degenerative disc disease), lumbar    Rationale for Evaluation and Treatment Rehabilitation  THERAPY DIAG:  Chronic pain of right knee  Muscle weakness (generalized)  Difficulty in walking, not elsewhere classified  ONSET DATE: 09/01/21  SUBJECTIVE:                                                                                                                                                                                            SUBJECTIVE STATEMENT: Seeing Dr Orland Penman on 11/28. MRI on knee was fine. I have plenty of exercises. Third knee injection later today.                                     PERTINENT HISTORY:  Rt THA, H/o knee pain that got better, L4/5 disk  PAIN:  Are you having pain? Yes: NPRS scale: 7/10 Pain location: Rt knee- surrounds patella Pain description: sharp when walking Aggravating factors: weight bearing Relieving factors: it is just fine in sitting    PRECAUTIONS: Fall  WEIGHT BEARING RESTRICTIONS No  FALLS:  Has patient fallen in last 6 months? 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   2.  Small suprapatellar joint effusion.   3.  Small Baker's cyst measuring approximately 2.1 x 1.0 x 3.0 cm.   4. Benign osseous lesion in the lateral tibial plateau most consistent  with enchondroma or intraosseous cyst.  MRI from 2022- lumbar levoscoliosis beginning at L2   PATIENT SURVEYS:  FOTO 39 10/10: 36   SENSATION: Tingling anterior thigh still present  PALPATION: 10/10: Patella stays in groove until very end range of ext and then tracks laterally  LOWER EXTREMITY ROM:     10/10: Excellent passive ROM through hip and knee continues  LOWER EXTREMITY  MMT:    EVAL: Active SLR available to 4" off of table actively, passively to 90 10/10: active SLR to 12" off table actively, passively to 90  MMT Right eval Left eval Rt/Lt 11/19/21  Hip flexion 24.1 27.5   Hip extension     Hip abduction 17.2 14.3   Hip adduction     Hip internal rotation     Hip external rotation     Knee flexion     Knee extension 46.3 42.6 40.9/40.6  Ankle dorsiflexion 29.2 28.9   Ankle plantarflexion     Ankle inversion     Ankle eversion      (Blank rows = not tested)   GAIT: 10/10: able to ambulate without antalgic pattern    TODAY'S TREATMENT  Treatment                            01/01/22:  IASTM Rt quads in extended position in supine.    Treatment                            12/25/21:  Supine leg circles with opp LE foot flat IASTM Rt quads & ITB Glut progression- bent knee hip ext, extended knee hip ext, ext knee hip ext + hip abd Bridge with feet on wall Clam with feet on wall, ball bw ankles Figure 4 stretch from wall   Treatment                            12/17/21:  Hooklying ab set + leg ext with SLR Supine LE circles Left sidelying- clams, 90 hip abd Prone HS curls + glut set + hip ext Prone bil HS curl with ab set Seated hip hinge Heel raises 5 lifts to 5s hold x5 Gastroc stretch Standing hip ext at bar in plank Seated hamstring & adductor stretch    PATIENT EDUCATION:  Education details: Anatomy of condition, POC, HEP, exercise form/rationale Person educated: Patient Education method: Consulting civil engineer, Demonstration, Tactile cues, Verbal cues, and Handouts Education comprehension: verbalized understanding, returned demonstration, verbal cues required, tactile cues required, and needs further education   HOME EXERCISE PROGRAM: [id number] Rt sidelying with pillow under right side and bw knees  ASSESSMENT:  CLINICAL IMPRESSION:  Utilized IASTM again today in order to decrease tightness in right thigh. Pt is frustrated by  lack of progress which is very understandable. She is going to see a different MD in pain management to see what he has to say and was encouraged to contact me with any further needs.   OBJECTIVE IMPAIRMENTS Abnormal gait, decreased activity tolerance, decreased balance, decreased endurance, decreased mobility, difficulty walking, decreased strength, increased muscle spasms, postural dysfunction, and pain.   ACTIVITY LIMITATIONS lifting, bending, standing, squatting, stairs, transfers, bathing, and locomotion level  PARTICIPATION LIMITATIONS: meal prep, cleaning, laundry, driving, shopping, and community activity  PERSONAL FACTORS 1-2 comorbidities: multiple treatments for back pain, h/o Rt THA  are also affecting patient's functional  outcome.     GOALS: Goals reviewed with patient? Yes  SHORT TERM GOALS: Target date: 10/16/21  Able to demo core control with pelvic strengthening activities Baseline: Goal status: achieved   LONG TERM GOALS: Target date:POC date  Pt will meet FOTO goal Baseline:  Goal status: ongoing  2.  Able to demonstrate hip abd strength via dynamometry to meet age-appropriate functional level Baseline:  see chart Goal status: not met  3.  Able to walk through grocery store before feeling limited by knee pain Baseline: 10/10: I can now go down 1 aisle and then have to go, previously was just to door Goal status: not met  4.  Able to demo SLR to at least 75 deg Baseline: 10/10: 25 deg Goal status: achieved   PLAN: PT FREQUENCY: 1x/week  PT DURATION: 8 weeks  PLANNED INTERVENTIONS: Therapeutic exercises, Therapeutic activity, Neuromuscular re-education, Balance training, Gait training, Patient/Family education, Self Care, Joint mobilization, Stair training, Aquatic Therapy, Dry Needling, Electrical stimulation, Spinal mobilization, Cryotherapy, Moist heat, Taping, and Manual therapy.  PLAN FOR NEXT SESSION: core stability, cont joint mobs in Rt  sidelying, correct Rt LE IR at heel strike  PHYSICAL THERAPY DISCHARGE SUMMARY  Visits from Start of Care: 12   Current functional level related to goals / functional outcomes: See above   Remaining deficits: See above   Education / Equipment: Anatomy of condition, POC, HEP, exercise form/rationale   Patient agrees to discharge. Patient goals were partially met. Patient is being discharged due to lack of progress.   Rolondo Pierre C. Jadelin Eng PT, DPT 01/01/22 11:56 AM     Referring diagnosis?  M54.16 (ICD-10-CM) - Lumbar radiculopathy  M79.604 (ICD-10-CM) - Right leg pain  M51.36 (ICD-10-CM) - DDD (degenerative disc disease), lumbar   Treatment diagnosis? (if different than referring diagnosis) m25.561  m62.81  r26.2 What was this (referring dx) caused by? _0  Surgery _1  Fall _2  Ongoing issue _3  Arthritis _4  Other: ____________  Laterality: _5  Rt _6  Lt _7  Both  Check all possible CPT codes:  *CHOOSE 10 OR LESS*    _8  97110 (Therapeutic Exercise)  _9  92507 (SLP Treatment)  _10  38466 (Neuro Re-ed)   _11  92526 (Swallowing Treatment)   _12  59935 (Gait Training)   _13  70177 (Cognitive Training, 1st 15 minutes) _14  97140 (Manual Therapy)   _15  97130 (Cognitive Training, each add'l 15 minutes)  _16  97164 (Re-evaluation)                              _17  Other, List CPT Code ____________  _18  93903 (Therapeutic Activities)     _19  00923 (Self Care)   _20  All codes above (97110 - 97535)  _21  97012 (Mechanical Traction)  _22  97014 (E-stim Unattended)  _23  97032 (E-stim manual)  _24  97033 (Ionto)  _25  97035 (Ultrasound) _26  97750 (Physical Performance Training) _27  30076 (Aquatic Therapy) _28  97016 (Vasopneumatic Device) _29  22633 (Paraffin) _30  35456 (Contrast Bath) _31  97597 (Wound Care 1st 20 sq cm) _32  97598 (Wound Care each add'l 20 sq cm) _33  97760 (Orthotic Fabrication, Fitting, Training Initial) _34  [id number] (Prosthetic Management and Training Initial) _35  [id number] (Orthotic or Prosthetic  Training/ Modification Subsequent)

## 2022-01-04 ENCOUNTER — Encounter: Payer: Self-pay | Admitting: Pharmacist Clinician (PhC)/ Clinical Pharmacy Specialist

## 2022-01-06 MED ORDER — REPATHA SURECLICK 140 MG/ML ~~LOC~~ SOAJ: 140.0000 mg | SUBCUTANEOUS | 11 refills | Status: DC

## 2022-01-06 NOTE — Addendum Note (Signed)
Addended by: Rockne Menghini on: 01/06/2022 12:38 PM   Modules accepted: Orders

## 2022-01-08 ENCOUNTER — Encounter (HOSPITAL_BASED_OUTPATIENT_CLINIC_OR_DEPARTMENT_OTHER): Payer: Medicare HMO | Admitting: Physical Therapy

## 2022-01-14 ENCOUNTER — Other Ambulatory Visit: Payer: Self-pay | Admitting: Cardiology

## 2022-01-14 NOTE — Progress Notes (Signed)
Chief Complaint:   OBESITY Courtney [name] is here to discuss her progress with her obesity treatment plan along with follow-up of her obesity related diagnoses. Courtney [name] is on the Category 1 Plan and states she is following her eating plan approximately 90% of the time. Courtney [name] states she is doing 0 minutes 0 times per week.  Today's visit was #: 78 Starting weight: 157 lbs Starting date: 06/16/2017 Today's weight: 159 lbs Today's date: 12/31/2021 Total lbs lost to date: 0 Total lbs lost since last in-office visit: 1  Interim History: Courtney [name] continues to work on her weight loss, but she is struggling with meeting her protein goals, and exercise is limited.  She is finished with physical therapy as of today.  Subjective:   1. Vitamin D deficiency Courtney [name] is on vitamin D, and she denies nausea, vomiting, or muscle weakness.  2. Prediabetes Courtney [name] notes decreased polyphagia on metformin, and occasional GI issues.  Assessment/Plan:   1. Vitamin D deficiency Courtney [name] will continue prescription vitamin D 50,000 IU every week, and we will refill for 90 days.  - Vitamin D, Ergocalciferol, (DRISDOL) 1.25 MG (50000 UNIT) CAPS capsule; Take 1 capsule (50,000 Units total) by mouth every 7 (seven) days.  Dispense: 12 capsule; Refill: 0  2. Prediabetes Courtney [name] will continue metformin 500 mg once daily with breakfast, and we will refill for 90 days.  - metFORMIN (GLUCOPHAGE) 500 MG tablet; Take 1 tablet (500 mg total) by mouth daily with breakfast.  Dispense: 90 tablet; Refill: 0  3. Obesity, Current BMI 31.2 Courtney [name] is currently in the action stage of change. As such, her goal is to maintain weight for now over the holidays. She has agreed to the Category 1 Plan.   Behavioral modification strategies: increasing lean protein intake.  Courtney [name] has agreed to follow-up with our clinic in 8 weeks. She was informed of the importance of frequent follow-up visits to maximize her success with intensive lifestyle  modifications for her multiple health conditions.   Objective:   Blood pressure 128/65, pulse (!) 45, temperature 97.6 F (36.4 C), height 5' (1.524 m), weight 159 lb (72.1 kg), SpO2 98 %. Body mass index is 31.05 kg/m.  General: Cooperative, alert, well developed, in no acute distress. HEENT: Conjunctivae and lids unremarkable. Cardiovascular: Regular rhythm.  Lungs: Normal work of breathing. Neurologic: No focal deficits.   Lab Results  Component Value Date   CREATININE 0.87 10/18/2021   BUN 22 10/18/2021   NA 140 10/18/2021   K 4.9 10/18/2021   CL 104 10/18/2021   CO2 23 10/18/2021   Lab Results  Component Value Date   ALT 19 10/18/2021   AST 20 10/18/2021   ALKPHOS 62 10/18/2021   BILITOT 0.4 10/18/2021   Lab Results  Component Value Date   HGBA1C 5.8 (H) 10/18/2021   HGBA1C 5.7 (H) 07/11/2021   HGBA1C 6.0 (H) 03/20/2021   HGBA1C 5.8 (H) 08/30/2020   HGBA1C 5.8 (H) 03/30/2020   Lab Results  Component Value Date   INSULIN 6.7 10/18/2021   INSULIN 9.0 07/11/2021   INSULIN 8.2 03/20/2021   INSULIN 6.8 08/30/2020   INSULIN 9.4 03/30/2020   Lab Results  Component Value Date   TSH 1.590 10/18/2021   Lab Results  Component Value Date   CHOL 146 10/18/2021   HDL 88 10/18/2021   LDLCALC 41 10/18/2021   TRIG 95 10/18/2021   CHOLHDL 1.6 05/15/2021   Lab Results  Component Value Date   VD25OH 63.8 10/18/2021  VD25OH 63.1 07/11/2021   VD25OH 79.4 03/20/2021   Lab Results  Component Value Date   WBC 7.3 01/03/2020   HGB 13.0 01/03/2020   HCT 37.7 01/03/2020   MCV 91.5 01/03/2020   PLT 196.0 01/03/2020   Lab Results  Component Value Date   IRON 117 10/17/2020   TIBC 431.2 10/17/2020   FERRITIN 133.2 10/17/2020   Attestation Statements:   Reviewed by clinician on day of visit: allergies, medications, problem list, medical history, surgical history, family history, social history, and previous encounter notes.   I, Trixie Dredge, am acting as  transcriptionist for Dennard Nip, MD.  I have reviewed the above documentation for accuracy and completeness, and I agree with the above. -  Dennard Nip, MD

## 2022-01-15 DIAGNOSIS — M25561 Pain in right knee: Secondary | ICD-10-CM | POA: Diagnosis not present

## 2022-01-15 DIAGNOSIS — M47896 Other spondylosis, lumbar region: Secondary | ICD-10-CM | POA: Diagnosis not present

## 2022-01-15 DIAGNOSIS — M545 Low back pain, unspecified: Secondary | ICD-10-CM | POA: Diagnosis not present

## 2022-01-15 DIAGNOSIS — M5416 Radiculopathy, lumbar region: Secondary | ICD-10-CM | POA: Diagnosis not present

## 2022-01-18 ENCOUNTER — Encounter (HOSPITAL_BASED_OUTPATIENT_CLINIC_OR_DEPARTMENT_OTHER): Payer: Medicare HMO | Admitting: Physical Therapy

## 2022-01-30 DIAGNOSIS — M5416 Radiculopathy, lumbar region: Secondary | ICD-10-CM | POA: Diagnosis not present

## 2022-02-08 DIAGNOSIS — M47896 Other spondylosis, lumbar region: Secondary | ICD-10-CM | POA: Diagnosis not present

## 2022-02-08 DIAGNOSIS — M25561 Pain in right knee: Secondary | ICD-10-CM | POA: Diagnosis not present

## 2022-02-19 DIAGNOSIS — M1711 Unilateral primary osteoarthritis, right knee: Secondary | ICD-10-CM | POA: Diagnosis not present

## 2022-02-22 DIAGNOSIS — Z0100 Encounter for examination of eyes and vision without abnormal findings: Secondary | ICD-10-CM | POA: Diagnosis not present

## 2022-02-22 DIAGNOSIS — Z961 Presence of intraocular lens: Secondary | ICD-10-CM | POA: Diagnosis not present

## 2022-02-22 DIAGNOSIS — H52223 Regular astigmatism, bilateral: Secondary | ICD-10-CM | POA: Diagnosis not present

## 2022-02-22 DIAGNOSIS — H524 Presbyopia: Secondary | ICD-10-CM | POA: Diagnosis not present

## 2022-02-22 DIAGNOSIS — H35373 Puckering of macula, bilateral: Secondary | ICD-10-CM | POA: Diagnosis not present

## 2022-02-22 DIAGNOSIS — H5203 Hypermetropia, bilateral: Secondary | ICD-10-CM | POA: Diagnosis not present

## 2022-02-22 DIAGNOSIS — Z9849 Cataract extraction status, unspecified eye: Secondary | ICD-10-CM | POA: Diagnosis not present

## 2022-02-22 DIAGNOSIS — H43393 Other vitreous opacities, bilateral: Secondary | ICD-10-CM | POA: Diagnosis not present

## 2022-02-26 ENCOUNTER — Encounter (INDEPENDENT_AMBULATORY_CARE_PROVIDER_SITE_OTHER): Payer: Self-pay | Admitting: Family Medicine

## 2022-02-26 ENCOUNTER — Ambulatory Visit (INDEPENDENT_AMBULATORY_CARE_PROVIDER_SITE_OTHER): Payer: Medicare HMO | Admitting: Family Medicine

## 2022-02-26 VITALS — BP 149/75 | HR 60 | Temp 97.5°F | Ht 60.0 in | Wt 161.0 lb

## 2022-02-26 DIAGNOSIS — I10 Essential (primary) hypertension: Secondary | ICD-10-CM

## 2022-02-26 DIAGNOSIS — E782 Mixed hyperlipidemia: Secondary | ICD-10-CM

## 2022-02-26 DIAGNOSIS — Z6831 Body mass index (BMI) 31.0-31.9, adult: Secondary | ICD-10-CM

## 2022-02-26 DIAGNOSIS — E669 Obesity, unspecified: Secondary | ICD-10-CM | POA: Diagnosis not present

## 2022-02-26 DIAGNOSIS — E559 Vitamin D deficiency, unspecified: Secondary | ICD-10-CM

## 2022-02-26 DIAGNOSIS — R7303 Prediabetes: Secondary | ICD-10-CM | POA: Diagnosis not present

## 2022-02-26 DIAGNOSIS — E88819 Insulin resistance, unspecified: Secondary | ICD-10-CM | POA: Diagnosis not present

## 2022-02-26 MED ORDER — VITAMIN D (ERGOCALCIFEROL) 1.25 MG (50000 UNIT) PO CAPS: 50000.0000 [IU] | ORAL_CAPSULE | ORAL | 0 refills | Status: DC

## 2022-02-26 MED ORDER — METFORMIN HCL 500 MG PO TABS: 500.0000 mg | ORAL_TABLET | Freq: Every day | ORAL | 0 refills | Status: DC

## 2022-02-27 DIAGNOSIS — M25561 Pain in right knee: Secondary | ICD-10-CM | POA: Diagnosis not present

## 2022-02-27 LAB — CMP14+EGFR
ALT: 12 IU/L (ref 0–32)
AST: 18 IU/L (ref 0–40)
Albumin/Globulin Ratio: 2.1 (ref 1.2–2.2)
Albumin: 4.4 g/dL (ref 3.8–4.8)
Alkaline Phosphatase: 62 IU/L (ref 44–121)
BUN/Creatinine Ratio: 20 (ref 12–28)
BUN: 17 mg/dL (ref 8–27)
Bilirubin Total: 0.3 mg/dL (ref 0.0–1.2)
CO2: 23 mmol/L (ref 20–29)
Calcium: 9.6 mg/dL (ref 8.7–10.3)
Chloride: 105 mmol/L (ref 96–106)
Creatinine, Ser: 0.83 mg/dL (ref 0.57–1.00)
Globulin, Total: 2.1 g/dL (ref 1.5–4.5)
Glucose: 96 mg/dL (ref 70–99)
Potassium: 4.8 mmol/L (ref 3.5–5.2)
Sodium: 141 mmol/L (ref 134–144)
Total Protein: 6.5 g/dL (ref 6.0–8.5)
eGFR: 74 mL/min/{1.73_m2} (ref >59)

## 2022-02-27 LAB — HEMOGLOBIN A1C
Est. average glucose Bld gHb Est-mCnc: 120 mg/dL
Hgb A1c MFr Bld: 5.8 % — ABNORMAL HIGH (ref 4.8–5.6)

## 2022-02-27 LAB — INSULIN, RANDOM: INSULIN: 10.2 u[IU]/mL (ref 2.6–24.9)

## 2022-02-27 LAB — LIPID PANEL WITH LDL/HDL RATIO
Cholesterol, Total: 127 mg/dL (ref 100–199)
HDL: 73 mg/dL (ref >39)
LDL Chol Calc (NIH): 35 mg/dL (ref 0–99)
LDL/HDL Ratio: 0.5 ratio (ref 0.0–3.2)
Triglycerides: 103 mg/dL (ref 0–149)
VLDL Cholesterol Cal: 19 mg/dL (ref 5–40)

## 2022-02-27 LAB — VITAMIN B12: Vitamin B-12: 332 pg/mL (ref 232–1245)

## 2022-02-27 LAB — VITAMIN D 25 HYDROXY (VIT D DEFICIENCY, FRACTURES): Vit D, 25-Hydroxy: 83.1 ng/mL (ref 30.0–100.0)

## 2022-02-27 LAB — TSH: TSH: 1.02 u[IU]/mL (ref 0.450–4.500)

## 2022-03-11 ENCOUNTER — Other Ambulatory Visit (HOSPITAL_COMMUNITY): Payer: Self-pay

## 2022-03-11 NOTE — Progress Notes (Signed)
Chief Complaint:   OBESITY Courtney [name] is here to discuss her progress with her obesity treatment plan along with follow-up of her obesity related diagnoses. Courtney [name] is on the Category 1 Plan and states she is following her eating plan approximately 85% of the time. Courtney [name] states she is doing 0 minutes 0 times per week.  Today's visit was #: 57 Starting weight: 157 lbs Starting date: 06/16/2017 Today's weight: 161 lbs Today's date: 02/26/2022 Total lbs lost to date: 0 Total lbs lost since last in-office visit: 0  Interim History: Courtney [name] did well with minimizing holiday weight gain over Thanksgiving and Christmas. She is dealing with back and knee pain which is limiting her exercise, but this will be improving soon hopefully after her ablation procedure.   Subjective:   1. Insulin resistance Courtney [name] is working her diet and she feels the metformin is helping improve her polyphagia.   2. Essential hypertension Courtney [name]'s blood pressure is mildly elevated today. She is due for labs and she is working on her diet.   3. Vitamin D deficiency Courtney [name] is on Vitamin D with no side effects noted. She needs a refill and she is due to have labs rechecked.   4. Mixed hyperlipidemia Courtney [name] is working on her diet and exercise. She is on Crestor and Zetia. She is due for labs. No side effects were noted on her medications.   Assessment/Plan:   1. Insulin resistance We will check labs today, and we will refill metformin for 90 days. Courtney [name] will continue with her diet.   - metFORMIN (GLUCOPHAGE) 500 MG tablet; Take 1 tablet (500 mg total) by mouth daily with breakfast.  Dispense: 90 tablet; Refill: 0 - Vitamin B12 - Insulin, random - Hemoglobin A1c  2. Essential hypertension We will check labs today. Courtney [name] will continue with her diet, and we will recheck her blood pressure in 1 month.   - CMP14+EGFR  3. Vitamin D deficiency We will check labs today, and we will refill prescription Vitamin D for 90 days.    - Vitamin D, Ergocalciferol, (DRISDOL) 1.25 MG (50000 UNIT) CAPS capsule; Take 1 capsule (50,000 Units total) by mouth every 7 (seven) days.  Dispense: 12 capsule; Refill: 0 - VITAMIN D 25 Hydroxy (Vit-D Deficiency, Fractures)  4. Mixed hyperlipidemia We will check labs today, and we will follow-up at Courtney [name]'s next visit.   - Lipid Panel With LDL/HDL Ratio - TSH  5. Obesity, Current BMI 31.6 Courtney [name] is currently in the action stage of change. As such, her goal is to continue with weight loss efforts. She has agreed to the Category 1 Plan.   Behavioral modification strategies: increasing lean protein intake and no skipping meals.  Courtney [name] has agreed to follow-up with our clinic in 4 weeks. She was informed of the importance of frequent follow-up visits to maximize her success with intensive lifestyle modifications for her multiple health conditions.   Courtney [name] was informed we would discuss her lab results at her next visit unless there is a critical issue that needs to be addressed sooner. Courtney [name] agreed to keep her next visit at the agreed upon time to discuss these results.  Objective:   Blood pressure (!) 149/75, pulse 60, temperature (!) 97.5 F (36.4 C), height 5' (1.524 m), weight 161 lb (73 kg), SpO2 96 %. Body mass index is 31.44 kg/m.  General: Cooperative, alert, well developed, in no acute distress. HEENT: Conjunctivae and lids unremarkable. Cardiovascular: Regular rhythm.  Lungs: Normal work of breathing. Neurologic:  No focal deficits.   Lab Results  Component Value Date   CREATININE 0.83 02/26/2022   BUN 17 02/26/2022   NA 141 02/26/2022   K 4.8 02/26/2022   CL 105 02/26/2022   CO2 23 02/26/2022   Lab Results  Component Value Date   ALT 12 02/26/2022   AST 18 02/26/2022   ALKPHOS 62 02/26/2022   BILITOT 0.3 02/26/2022   Lab Results  Component Value Date   HGBA1C 5.8 (H) 02/26/2022   HGBA1C 5.8 (H) 10/18/2021   HGBA1C 5.7 (H) 07/11/2021   HGBA1C 6.0 (H)  03/20/2021   HGBA1C 5.8 (H) 08/30/2020   Lab Results  Component Value Date   INSULIN 10.2 02/26/2022   INSULIN 6.7 10/18/2021   INSULIN 9.0 07/11/2021   INSULIN 8.2 03/20/2021   INSULIN 6.8 08/30/2020   Lab Results  Component Value Date   TSH 1.020 02/26/2022   Lab Results  Component Value Date   CHOL 127 02/26/2022   HDL 73 02/26/2022   LDLCALC 35 02/26/2022   TRIG 103 02/26/2022   CHOLHDL 1.6 05/15/2021   Lab Results  Component Value Date   VD25OH 83.1 02/26/2022   VD25OH 63.8 10/18/2021   VD25OH 63.1 07/11/2021   Lab Results  Component Value Date   WBC 7.3 01/03/2020   HGB 13.0 01/03/2020   HCT 37.7 01/03/2020   MCV 91.5 01/03/2020   PLT 196.0 01/03/2020   Lab Results  Component Value Date   IRON 117 10/17/2020   TIBC 431.2 10/17/2020   FERRITIN 133.2 10/17/2020   Attestation Statements:   Reviewed by clinician on day of visit: allergies, medications, problem list, medical history, surgical history, family history, social history, and previous encounter notes.   I, Trixie Dredge, am acting as transcriptionist for Dennard Nip, MD.  I have reviewed the above documentation for accuracy and completeness, and I agree with the above. -  Dennard Nip, MD

## 2022-03-13 DIAGNOSIS — M25561 Pain in right knee: Secondary | ICD-10-CM | POA: Diagnosis not present

## 2022-03-20 NOTE — Progress Notes (Signed)
Cardiology Office Note:    Date:  03/29/2022   ID:  Courtney [name], DOB 1950/09/28, MRN [id number]  PCP:  Horald Pollen, MD  Cardiologist:  Gearl Kimbrough [place], MD   Referring MD: Horald Pollen, *   Chief Complaint  Patient presents with   Coronary Artery Disease    History of Present Illness:    Courtney [name] is a 72 y.o. female with a hx of CAD, carotid artery disease, HLD, HTN, and PVCs. She is s/p BMS to mid RCA in 2009. Abnormal nuclear stress test in 2011 led to a repeat cath that showed nonobstructive disease. Repeat myoview in Oct 2018 was normal.   On her visit in 2022  a murmur was noted. Echo was obtained showing mild Aortic stenosis  On follow up today she is doing well. Denies any chest pain.BP has been good. She has been on Repatha for a year now with excellent results. She hasn't been able to exercise much due to knee path and lumbar radiculopathy.   Past Medical History:  Diagnosis Date   Aortic valve sclerosis    echo [id number], soft systolic murmur   Arthritis    in right hip   Back pain    Basal cell carcinoma 05/31/2016   bcc left forehead  tx exc   Carotid artery disease (HCC)    40-59% bilateral, doppler December 2010   Cataract    Constipation    Coronary artery disease    a. BMS-mid RCA 11/2007 b. stress echo 02/2009 subtle inferior HK, lateral ST depressions with stress c. follow-up cath 02/2009: RCA stent patent, severe but stable stenosis of distal posterior lateral branch RCA. LV normal, med tx unless more sx c. ETT Myoview 08/27/12: negative for ischemia; EF 64%   Decreased hearing    Depression    Mild, August, 2012   Dyslipidemia    Easy bruising    Ejection fraction    EF normal, catheterization, 2011 /  EF 55%, echo, 2010   Fluid overload    Mild, stable since hospitalization in the past   GERD (gastroesophageal reflux disease)    occarionally, takes Pepcid over the counter   Heart murmur    has, occasional PVC's   History of  right hip replacement    Hyperlipidemia    Hypertension    Joint pain    Myocardial infarction ([place])    2009   Palpitations    PVC (premature ventricular contraction)    per prior Holter monitor   Right hip pain 12/2009   Shellfish allergy    Stress fracture    Left foot   Trouble in sleeping    Vitamin B12 deficiency    Vitamin D deficiency     Past Surgical History:  Procedure Laterality Date   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/01/2005   BREAST BIOPSY Left    carcinoid tumor removal     colon-   CARDIAC CATHETERIZATION  1/211   Patent RCA stent, stenotic PLB supplying small distribution; medically managed   CATARACT EXTRACTION, BILATERAL     CORONARY ANGIOPLASTY WITH STENT PLACEMENT  11/2007   BMS-mid RCA   DIAGNOSTIC LAPAROSCOPY     1982 for endometriosis   DILATION AND CURETTAGE OF UTERUS  2005   SKIN CANCER EXCISION     TOTAL HIP ARTHROPLASTY Right 07/08/2014   Procedure: RIGHT TOTAL HIP ARTHROPLASTY ANTERIOR APPROACH;  Surgeon: Mcarthur Rossetti, MD;  Location: [facility];  Service: Orthopedics;  Laterality: Right;  WISDOM TOOTH EXTRACTION      Current Medications: Current Meds  Medication Sig   aspirin 81 MG tablet Take 81 mg by mouth daily.   clobetasol cream (TEMOVATE) 0.05 % Apply 1-2 times a day to poison oak for 2 weeks   dicyclomine (BENTYL) 20 MG tablet Take 1 tablet (20 mg total) by mouth every 6 (six) hours as needed for spasms.   doxepin (SINEQUAN) 10 MG capsule Take 10 mg by mouth at bedtime.   gabapentin (NEURONTIN) 300 MG capsule Take 1 capsule (300 mg total) by mouth at bedtime.   metFORMIN (GLUCOPHAGE) 500 MG tablet Take 1 tablet (500 mg total) by mouth daily with breakfast.   nitroGLYCERIN (NITROSTAT) 0.4 MG SL tablet Place 1 tablet (0.4 mg total) under the tongue every 5 (five) minutes as needed for chest pain.   pantoprazole (PROTONIX) 40 MG tablet Take 1 tablet (40 mg total) by mouth daily.   Vitamin D, Ergocalciferol, (DRISDOL) 1.25 MG (50000 UNIT)  CAPS capsule Take 1 capsule (50,000 Units total) by mouth every 7 (seven) days.   [DISCONTINUED] Evolocumab (REPATHA SURECLICK) [id number] MG/ML SOAJ Inject 140 mg into the skin every 14 (fourteen) days.   [DISCONTINUED] ezetimibe (ZETIA) 10 MG tablet Take 1 tablet (10 mg total) by mouth daily.   [DISCONTINUED] metoprolol tartrate (LOPRESSOR) 25 MG tablet TAKE 1/2 TABLET TWICE DAILY   [DISCONTINUED] ramipril (ALTACE) 10 MG capsule TAKE 1 CAPSULE (10 MG TOTAL) BY MOUTH DAILY.   [DISCONTINUED] rosuvastatin (CRESTOR) 40 MG tablet Take 1 tablet (40 mg total) by mouth daily.     Allergies:   Erythromycin, Penicillins, and Shellfish allergy   Social History   Socioeconomic History   Marital status: Divorced    Spouse name: Not on file   Number of children: 1   Years of education: 18   Highest education level: Not on file  Occupational History   Occupation: Retired - Retail buyer  Tobacco Use   Smoking status: Never   Smokeless tobacco: Never  Vaping Use   Vaping Use: Never used  Substance and Sexual Activity   Alcohol use: Yes    Comment: 1 glass of wine 3x/week   Drug use: No   Sexual activity: Yes  Other Topics Concern   Not on file  Social History Narrative   Right handed   Soda- sometimes   Drinks herbal tea   Fun: Dance, garden,    Denies religious beliefs effecting health care.   Denies abuse and feels safe at home   Social Determinants of Health   Financial Resource Strain: Low Risk  (09/24/2021)   Overall Financial Resource Strain (CARDIA)    Difficulty of Paying Living Expenses: Not hard at all  Food Insecurity: No Food Insecurity (09/24/2021)   Hunger Vital Sign    Worried About Running Out of Food in the Last Year: Never true    Ran Out of Food in the Last Year: Never true  Transportation Needs: No Transportation Needs (09/24/2021)   PRAPARE - Hydrologist (Medical): No    Lack of Transportation (Non-Medical): No  Physical Activity:  Inactive (09/24/2021)   Exercise Vital Sign    Days of Exercise per Week: 0 days    Minutes of Exercise per Session: 0 min  Stress: No Stress Concern Present (09/24/2021)   [place]    Feeling of Stress : Not at all  Social Connections: Moderately Isolated (09/24/2021)  Social Connection and Isolation Panel [NHANES]    Frequency of Communication with Friends and Family: Three times a week    Frequency of Social Gatherings with Friends and Family: Three times a week    Attends Religious Services: Never    Active Member of Clubs or Organizations: No    Attends Archivist Meetings: Never    Marital Status: Living with partner     Family History: The patient's family history includes Heart attack in her paternal grandfather; Heart attack (age of onset: 24) in her father; Heart disease in her mother and sister; Hyperlipidemia in her father and mother; Hypertension in her father and mother; Obesity in her mother; Stroke in her mother; Sudden death in her father. There is no history of BRCA 1/2, Breast cancer, Esophageal cancer, Colon cancer, Pancreatic cancer, Liver disease, or Stomach cancer.  ROS:   Please see the history of present illness.    All other systems reviewed and are negative.  EKGs/Labs/Other Studies Reviewed:    The following studies were reviewed today:  Carotid ultrasound 2020 Summary:  Right Carotid: Velocities in the right ICA are consistent with a 1-39%  stenosis.   Left Carotid: Velocities in the left ICA are consistent with a 1-39%  stenosis.   Vertebrals:  Bilateral vertebral arteries demonstrate antegrade flow.  Subclavians: Normal flow hemodynamics were seen in bilateral subclavian arterie   Echo 10/03/20: IMPRESSIONS     1. Left ventricular ejection fraction, by estimation, is 65 to 70%. The  left ventricle has normal function. The left ventricle has no regional  wall motion  abnormalities. Left ventricular diastolic parameters are  consistent with Grade I diastolic  dysfunction (impaired relaxation). The average left ventricular global  longitudinal strain is -22.0 %. The global longitudinal strain is normal.   2. Right ventricular systolic function is normal. The right ventricular  size is normal. There is normal pulmonary artery systolic pressure.   3. The mitral valve is normal in structure. Trivial mitral valve  regurgitation. No evidence of mitral stenosis.   4. The aortic valve is tricuspid. There is mild calcification of the  aortic valve. There is mild thickening of the aortic valve. Aortic valve  regurgitation is trivial. Mild aortic valve stenosis.   5. The inferior vena cava is normal in size with greater than 50%  respiratory variability, suggesting right atrial pressure of 3 mmHg.           EKG:  EKG is ordered today.  NSR rate 58. Normal. I have personally reviewed and interpreted this study.   Recent Labs: 02/26/2022: ALT 12; BUN 17; Creatinine, Ser 0.83; Potassium 4.8; Sodium 141; TSH 1.020  Recent Lipid Panel    Component Value Date/Time   CHOL 127 02/26/2022 1046   TRIG 103 02/26/2022 1046   HDL 73 02/26/2022 1046   CHOLHDL 1.6 05/15/2021 1000   CHOLHDL 3 11/30/2015 1028   VLDL 25.4 11/30/2015 1028   LDLCALC 35 02/26/2022 1046    Physical Exam:    VS:  BP 130/68   Pulse (!) 58   Ht 5' (1.524 m)   Wt 164 lb (74.4 kg)   SpO2 99%   BMI 32.03 kg/m     Wt Readings from Last 3 Encounters:  03/29/22 164 lb (74.4 kg)  03/26/22 160 lb (72.6 kg)  02/26/22 161 lb (73 kg)     GEN: Well nourished, well developed in no acute distress HEENT: Normal NECK: No JVD; No carotid bruits LYMPHATICS: No  lymphadenopathy CARDIAC: regular rhythm, bradycardic rate, + systolic  murmur RESPIRATORY:  Clear to auscultation without rales, wheezing or rhonchi  ABDOMEN: Soft, non-tender, non-distended MUSCULOSKELETAL:  No edema; No deformity  SKIN:  Warm and dry NEUROLOGIC:  Alert and oriented x 3 PSYCHIATRIC:  Normal affect   ASSESSMENT:    1. Coronary artery disease involving native coronary artery of native heart without angina pectoris   2. Essential hypertension   3. Hyperlipidemia LDL goal <70   4. Nonrheumatic aortic valve stenosis      PLAN:    In order of problems listed above:  CAD s/p BMS-RCA 2009 - continue ASA, crestor - nonischemic nuclear stress test 07/2016 - no angina - normal Ecg   2. Hypertension - continue BB, ACEI - BP is well-controlled   3. Hyperlipidemia with LDL goal < 70 On Zetia, Crestor, Repatha - excellent results with LDL 35   4. Mild aortic stenosis.  - asymptomatic   Follow up in one year.    Medication Adjustments/Labs and Tests Ordered: Current medicines are reviewed at length with the patient today.  Concerns regarding medicines are outlined above.  Orders Placed This Encounter  Procedures   EKG 12-Lead    Meds ordered this encounter  Medications   ezetimibe (ZETIA) 10 MG tablet    Sig: Take 1 tablet (10 mg total) by mouth daily.    Dispense:  90 tablet    Refill:  3   metoprolol tartrate (LOPRESSOR) 25 MG tablet    Sig: Take 0.5 tablets (12.5 mg total) by mouth 2 (two) times daily.    Dispense:  180 tablet    Refill:  3   ramipril (ALTACE) 10 MG capsule    Sig: Take 1 capsule (10 mg total) by mouth daily.    Dispense:  90 capsule    Refill:  3   rosuvastatin (CRESTOR) 40 MG tablet    Sig: Take 1 tablet (40 mg total) by mouth daily.    Dispense:  90 tablet    Refill:  3   Evolocumab (REPATHA SURECLICK) [id number] MG/ML SOAJ    Sig: Inject 140 mg into the skin every 14 (fourteen) days.    Dispense:  2 mL    Refill:  11     Signed, Srihari Shellhammer [place], MD  03/29/2022 1:42 PM    [facility]

## 2022-03-25 ENCOUNTER — Ambulatory Visit: Payer: Medicare HMO | Admitting: Cardiology

## 2022-03-25 DIAGNOSIS — Z01419 Encounter for gynecological examination (general) (routine) without abnormal findings: Secondary | ICD-10-CM | POA: Diagnosis not present

## 2022-03-25 DIAGNOSIS — Z6830 Body mass index (BMI) 30.0-30.9, adult: Secondary | ICD-10-CM | POA: Diagnosis not present

## 2022-03-25 DIAGNOSIS — M8580 Other specified disorders of bone density and structure, unspecified site: Secondary | ICD-10-CM | POA: Diagnosis not present

## 2022-03-25 DIAGNOSIS — F4321 Adjustment disorder with depressed mood: Secondary | ICD-10-CM | POA: Diagnosis not present

## 2022-03-25 DIAGNOSIS — Z1211 Encounter for screening for malignant neoplasm of colon: Secondary | ICD-10-CM | POA: Diagnosis not present

## 2022-03-25 DIAGNOSIS — G4700 Insomnia, unspecified: Secondary | ICD-10-CM | POA: Diagnosis not present

## 2022-03-25 DIAGNOSIS — Z1239 Encounter for other screening for malignant neoplasm of breast: Secondary | ICD-10-CM | POA: Diagnosis not present

## 2022-03-26 ENCOUNTER — Encounter (INDEPENDENT_AMBULATORY_CARE_PROVIDER_SITE_OTHER): Payer: Self-pay | Admitting: Family Medicine

## 2022-03-26 ENCOUNTER — Ambulatory Visit (INDEPENDENT_AMBULATORY_CARE_PROVIDER_SITE_OTHER): Payer: Medicare HMO | Admitting: Family Medicine

## 2022-03-26 ENCOUNTER — Other Ambulatory Visit: Payer: Self-pay | Admitting: Obstetrics and Gynecology

## 2022-03-26 VITALS — BP 149/67 | HR 58 | Temp 97.5°F | Ht 60.0 in | Wt 160.0 lb

## 2022-03-26 DIAGNOSIS — E559 Vitamin D deficiency, unspecified: Secondary | ICD-10-CM | POA: Diagnosis not present

## 2022-03-26 DIAGNOSIS — Z6831 Body mass index (BMI) 31.0-31.9, adult: Secondary | ICD-10-CM | POA: Insufficient documentation

## 2022-03-26 DIAGNOSIS — M8580 Other specified disorders of bone density and structure, unspecified site: Secondary | ICD-10-CM

## 2022-03-26 DIAGNOSIS — I10 Essential (primary) hypertension: Secondary | ICD-10-CM | POA: Diagnosis not present

## 2022-03-26 DIAGNOSIS — Z6830 Body mass index (BMI) 30.0-30.9, adult: Secondary | ICD-10-CM | POA: Insufficient documentation

## 2022-03-26 DIAGNOSIS — E669 Obesity, unspecified: Secondary | ICD-10-CM | POA: Insufficient documentation

## 2022-03-29 ENCOUNTER — Encounter: Payer: Self-pay | Admitting: Cardiology

## 2022-03-29 ENCOUNTER — Ambulatory Visit: Payer: Medicare HMO | Attending: Cardiology | Admitting: Cardiology

## 2022-03-29 VITALS — BP 130/68 | HR 58 | Ht 60.0 in | Wt 164.0 lb

## 2022-03-29 DIAGNOSIS — I10 Essential (primary) hypertension: Secondary | ICD-10-CM

## 2022-03-29 DIAGNOSIS — E785 Hyperlipidemia, unspecified: Secondary | ICD-10-CM

## 2022-03-29 DIAGNOSIS — I2510 Atherosclerotic heart disease of native coronary artery without angina pectoris: Secondary | ICD-10-CM | POA: Diagnosis not present

## 2022-03-29 DIAGNOSIS — I350 Nonrheumatic aortic (valve) stenosis: Secondary | ICD-10-CM | POA: Diagnosis not present

## 2022-03-29 MED ORDER — RAMIPRIL 10 MG PO CAPS: 10.0000 mg | ORAL_CAPSULE | Freq: Every day | ORAL | 3 refills | Status: DC

## 2022-03-29 MED ORDER — METOPROLOL TARTRATE 25 MG PO TABS: 12.5000 mg | ORAL_TABLET | Freq: Two times a day (BID) | ORAL | 3 refills | Status: DC

## 2022-03-29 MED ORDER — REPATHA SURECLICK 140 MG/ML ~~LOC~~ SOAJ: 140.0000 mg | SUBCUTANEOUS | 11 refills | Status: DC

## 2022-03-29 MED ORDER — ROSUVASTATIN CALCIUM 40 MG PO TABS: 40.0000 mg | ORAL_TABLET | Freq: Every day | ORAL | 3 refills | Status: DC

## 2022-03-29 MED ORDER — EZETIMIBE 10 MG PO TABS
10.0000 mg | ORAL_TABLET | Freq: Every day | ORAL | 3 refills | Status: DC
Start: 2022-03-29 — End: 2023-05-05

## 2022-03-29 NOTE — Patient Instructions (Signed)
Medication Instructions:  Continue current medications  *If you need a refill on your cardiac medications before your next appointment, please call your pharmacy*   Lab Work: None Ordered   Testing/Procedures: None Ordered   Follow-Up: At [facility], you and your health needs are our priority.  As part of our continuing mission to provide you with exceptional heart care, we have created designated Provider Care Teams.  These Care Teams include your primary Cardiologist (physician) and Advanced Practice Providers (APPs -  Physician Assistants and Nurse Practitioners) who all work together to provide you with the care you need, when you need it.  We recommend signing up for the patient portal called "MyChart".  Sign up information is provided on this After Visit Summary.  MyChart is used to connect with patients for Virtual Visits (Telemedicine).  Patients are able to view lab/test results, encounter notes, upcoming appointments, etc.  Non-urgent messages can be sent to your provider as well.   To learn more about what you can do with MyChart, go to [email or web address].    Your next appointment:   1 year(s)  Provider:   Peter [place], MD     Other Instructions

## 2022-04-02 DIAGNOSIS — M1711 Unilateral primary osteoarthritis, right knee: Secondary | ICD-10-CM | POA: Diagnosis not present

## 2022-04-09 ENCOUNTER — Other Ambulatory Visit: Payer: Self-pay | Admitting: Obstetrics and Gynecology

## 2022-04-09 DIAGNOSIS — Z1231 Encounter for screening mammogram for malignant neoplasm of breast: Secondary | ICD-10-CM

## 2022-04-09 NOTE — Progress Notes (Unsigned)
Chief Complaint:   OBESITY Sander is here to discuss her progress with her obesity treatment plan along with follow-up of her obesity related diagnoses. Dorothye is on the Category 1 Plan and states she is following her eating plan approximately 80% of the time. Alizon states she is doing 0 minutes 0 times per week.  Today's visit was #: 3 Starting weight: 157 lbs Starting date: 06/16/2017 Today's weight: 160 lbs Today's date: 03/26/2022 Total lbs lost to date: 0 Total lbs lost since last in-office visit: 1  Interim History: Annalayah is working on increasing her protein. Her exercise is limited due to knee pain and back pain. 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   Subjective:   1. Essential hypertension Merlyn's blood pressure is above goal today. She is in more pain recently. She didn't take her medications yet today.   2. Vitamin D deficiency Icelynn's Vitamin D level was at goal. She is at risk of over-replacement eventually. I discussed labs with the patient today.   Assessment/Plan:   1. Essential hypertension Blaklee is to take her medications and continue to work on her diet, exercise, and weight loss.   2. Vitamin D deficiency Margart is to continue Vitamin D and we will recheck labs this Spring (2-3 months).   3. BMI 31.0-31.9,adult  4. Obesity, Beginning BMI 30.66 Meagon is currently in the action stage of change. As such, her goal is to continue with weight loss efforts. She has agreed to the Category 1 Plan.   Exercise goals: As tolerated when her pain improves.   Behavioral modification strategies: increasing lean protein intake.  Jadene has agreed to follow-up with our clinic in 4 weeks. She was informed of the importance of frequent follow-up visits to maximize her success with intensive lifestyle modifications for her multiple health conditions.   Objective:   Blood pressure (!) 149/67, pulse (!) 58, temperature (!) 97.5 F (36.4 C), height 5' (1.524 m),  weight 160 lb (72.6 kg), SpO2 96 %. Body mass index is 31.25 kg/m.  General: Cooperative, alert, well developed, in no acute distress. HEENT: Conjunctivae and lids unremarkable. Cardiovascular: Regular rhythm.  Lungs: Normal work of breathing. Neurologic: No focal deficits.   Lab Results  Component Value Date   CREATININE 0.83 02/26/2022   BUN 17 02/26/2022   NA 141 02/26/2022   K 4.8 02/26/2022   CL 105 02/26/2022   CO2 23 02/26/2022   Lab Results  Component Value Date   ALT 12 02/26/2022   AST 18 02/26/2022   ALKPHOS 62 02/26/2022   BILITOT 0.3 02/26/2022   Lab Results  Component Value Date   HGBA1C 5.8 (H) 02/26/2022   HGBA1C 5.8 (H) 10/18/2021   HGBA1C 5.7 (H) 07/11/2021   HGBA1C 6.0 (H) 03/20/2021   HGBA1C 5.8 (H) 08/30/2020   Lab Results  Component Value Date   INSULIN 10.2 02/26/2022   INSULIN 6.7 10/18/2021   INSULIN 9.0 07/11/2021   INSULIN 8.2 03/20/2021   INSULIN 6.8 08/30/2020   Lab Results  Component Value Date   TSH 1.020 02/26/2022   Lab Results  Component Value Date   CHOL 127 02/26/2022   HDL 73 02/26/2022   LDLCALC 35 02/26/2022   TRIG 103 02/26/2022   CHOLHDL 1.6 05/15/2021   Lab Results  Component Value Date   VD25OH 83.1 02/26/2022   VD25OH 63.8 10/18/2021   VD25OH 63.1 07/11/2021   Lab Results  Component Value Date   WBC 7.3 01/03/2020  HGB 13.0 01/03/2020   HCT 37.7 01/03/2020   MCV 91.5 01/03/2020   PLT 196.0 01/03/2020   Lab Results  Component Value Date   IRON 117 10/17/2020   TIBC 431.2 10/17/2020   FERRITIN 133.2 10/17/2020   Attestation Statements:   Reviewed by clinician on day of visit: allergies, medications, problem list, medical history, surgical history, family history, social history, and previous encounter notes.  Time spent on visit including pre-visit chart review and post-visit care and charting was 30 minutes.   I, Trixie Dredge, am acting as transcriptionist for Dennard Nip, MD.  I have  reviewed the above documentation for accuracy and completeness, and I agree with the above. -  Dennard Nip, MD

## 2022-04-17 ENCOUNTER — Encounter: Payer: Self-pay | Admitting: Emergency Medicine

## 2022-04-17 ENCOUNTER — Ambulatory Visit (INDEPENDENT_AMBULATORY_CARE_PROVIDER_SITE_OTHER): Payer: Medicare HMO | Admitting: Emergency Medicine

## 2022-04-17 VITALS — BP 126/68 | HR 47 | Temp 97.6°F | Ht 60.0 in | Wt 165.1 lb

## 2022-04-17 DIAGNOSIS — G4709 Other insomnia: Secondary | ICD-10-CM | POA: Diagnosis not present

## 2022-04-17 DIAGNOSIS — G8929 Other chronic pain: Secondary | ICD-10-CM

## 2022-04-17 DIAGNOSIS — R7303 Prediabetes: Secondary | ICD-10-CM

## 2022-04-17 DIAGNOSIS — E785 Hyperlipidemia, unspecified: Secondary | ICD-10-CM | POA: Diagnosis not present

## 2022-04-17 DIAGNOSIS — M25561 Pain in right knee: Secondary | ICD-10-CM

## 2022-04-17 DIAGNOSIS — F5104 Psychophysiologic insomnia: Secondary | ICD-10-CM | POA: Diagnosis not present

## 2022-04-17 DIAGNOSIS — I10 Essential (primary) hypertension: Secondary | ICD-10-CM | POA: Diagnosis not present

## 2022-04-17 DIAGNOSIS — I2510 Atherosclerotic heart disease of native coronary artery without angina pectoris: Secondary | ICD-10-CM

## 2022-04-17 DIAGNOSIS — K2100 Gastro-esophageal reflux disease with esophagitis, without bleeding: Secondary | ICD-10-CM

## 2022-04-17 MED ORDER — GABAPENTIN 300 MG PO CAPS: 300.0000 mg | ORAL_CAPSULE | Freq: Every day | ORAL | 3 refills | Status: DC

## 2022-04-17 MED ORDER — DICYCLOMINE HCL 20 MG PO TABS: 20.0000 mg | ORAL_TABLET | Freq: Four times a day (QID) | ORAL | 0 refills | Status: DC | PRN

## 2022-04-17 MED ORDER — ZOLPIDEM TARTRATE 5 MG PO TABS: 5.0000 mg | ORAL_TABLET | Freq: Every evening | ORAL | 1 refills | Status: DC | PRN

## 2022-04-17 NOTE — Assessment & Plan Note (Addendum)
Active and affecting quality of life. Over-the-counter sleep aids not working. Sleep hygiene measures discussed. Recommend Ambien 5 mg at bedtime.

## 2022-04-17 NOTE — Assessment & Plan Note (Signed)
Well-controlled hypertension. Continue ramipril 10 mg daily and metoprolol tartrate 12.5 mg twice a day Cardiovascular risks associated with hypertension discussed. Dietary approaches to stop hypertension discussed. BP Readings from Last 3 Encounters:  04/17/22 126/68  03/29/22 130/68  03/26/22 (!) 149/67

## 2022-04-17 NOTE — Assessment & Plan Note (Signed)
Stable.  Asymptomatic.  Continues pantoprazole 40 mg daily.

## 2022-04-17 NOTE — Assessment & Plan Note (Signed)
Stable.  No recent anginal episodes.  No need for nitroglycerin use. Continues daily baby aspirin and beta-blocker with metoprolol tartrate 12.5 mg twice a day

## 2022-04-17 NOTE — Patient Instructions (Signed)
Health Maintenance After Age 74 After age 74, you are at a higher risk for certain long-term diseases and infections as well as injuries from falls. Falls are a major cause of broken bones and head injuries in people who are older than age 74. Getting regular preventive care can help to keep you healthy and well. Preventive care includes getting regular testing and making lifestyle changes as recommended by your health care provider. Talk with your health care provider about: Which screenings and tests you should have. A screening is a test that checks for a disease when you have no symptoms. A diet and exercise plan that is right for you. What should I know about screenings and tests to prevent falls? Screening and testing are the best ways to find a health problem early. Early diagnosis and treatment give you the best chance of managing medical conditions that are common after age 74. Certain conditions and lifestyle choices may make you more likely to have a fall. Your health care provider may recommend: Regular vision checks. Poor vision and conditions such as cataracts can make you more likely to have a fall. If you wear glasses, make sure to get your prescription updated if your vision changes. Medicine review. Work with your health care provider to regularly review all of the medicines you are taking, including over-the-counter medicines. Ask your health care provider about any side effects that may make you more likely to have a fall. Tell your health care provider if any medicines that you take make you feel dizzy or sleepy. Strength and balance checks. Your health care provider may recommend certain tests to check your strength and balance while standing, walking, or changing positions. Foot health exam. Foot pain and numbness, as well as not wearing proper footwear, can make you more likely to have a fall. Screenings, including: Osteoporosis screening. Osteoporosis is a condition that causes  the bones to get weaker and break more easily. Blood pressure screening. Blood pressure changes and medicines to control blood pressure can make you feel dizzy. Depression screening. You may be more likely to have a fall if you have a fear of falling, feel depressed, or feel unable to do activities that you used to do. Alcohol use screening. Using too much alcohol can affect your balance and may make you more likely to have a fall. Follow these instructions at home: Lifestyle Do not drink alcohol if: Your health care provider tells you not to drink. If you drink alcohol: Limit how much you have to: 0-1 drink a day for women. 0-2 drinks a day for men. Know how much alcohol is in your drink. In the [place] one drink equals one 12 oz bottle of beer (355 mL), one 5 oz glass of wine (148 mL), or one 1 oz glass of hard liquor (44 mL). Do not use any products that contain nicotine or tobacco. These products include cigarettes, chewing tobacco, and vaping devices, such as e-cigarettes. If you need help quitting, ask your health care provider. Activity  Follow a regular exercise program to stay fit. This will help you maintain your balance. Ask your health care provider what types of exercise are appropriate for you. If you need a cane or walker, use it as recommended by your health care provider. Wear supportive shoes that have nonskid soles. Safety  Remove any tripping hazards, such as rugs, cords, and clutter. Install safety equipment such as grab bars in bathrooms and safety rails on stairs. Keep rooms and walkways   well-lit. General instructions Talk with your health care provider about your risks for falling. Tell your health care provider if: You fall. Be sure to tell your health care provider about all falls, even ones that seem minor. You feel dizzy, tiredness (fatigue), or off-balance. Take over-the-counter and prescription medicines only as told by your health care provider. These include  supplements. Eat a healthy diet and maintain a healthy weight. A healthy diet includes low-fat dairy products, low-fat (lean) meats, and fiber from whole grains, beans, and lots of fruits and vegetables. Stay current with your vaccines. Schedule regular health, dental, and eye exams. Summary Having a healthy lifestyle and getting preventive care can help to protect your health and wellness after age 74. Screening and testing are the best way to find a health problem early and help you avoid having a fall. Early diagnosis and treatment give you the best chance for managing medical conditions that are more common for people who are older than age 74. Falls are a major cause of broken bones and head injuries in people who are older than age 74. Take precautions to prevent a fall at home. Work with your health care provider to learn what changes you can make to improve your health and wellness and to prevent falls. This information is not intended to replace advice given to you by your health care provider. Make sure you discuss any questions you have with your health care provider. Document Revised: 06/26/2020 Document Reviewed: 06/26/2020 Elsevier Patient Education  2023 [employer]  

## 2022-04-17 NOTE — Assessment & Plan Note (Signed)
Active and affecting quality of life. Follows up with orthopedist on a regular basis

## 2022-04-17 NOTE — Assessment & Plan Note (Signed)
Well-controlled.  Last hemoglobin A1c of 5.8. Diet and nutrition discussed.

## 2022-04-17 NOTE — Assessment & Plan Note (Signed)
Able chronic condition.  Normal recently done lipid profile. Continue rosuvastatin 40 mg daily.

## 2022-04-17 NOTE — Progress Notes (Signed)
Courtney [name] 72 y.o.   Chief Complaint  Patient presents with   Follow-up    26mth f/u appt, patient states she is having some sleep issues     HISTORY OF PRESENT ILLNESS: This is a 72y.o. female here for 646-monthollow-up Still complaining of sleep issues.  Unable to sleep. Her GYN doctor recommended some sleeping medication, doxepin, but she does not want to take an antidepressant Has chronic pain to right knee.  Sees orthopedist on a regular basis.  Was recommended to take Cymbalta but does not want to take an antidepressant. [facility].  Had blood work last month which was all normal.  Results reviewed with patient. No other complaints or medical concerns today.  HPI   Prior to Admission medications   Medication Sig Start Date End Date Taking? Authorizing Provider  aspirin 81 MG tablet Take 81 mg by mouth daily.   Yes [provider]  clobetasol cream (TEMOVATE) 0.05 % Apply 1-2 times a day to poison [place] for 2 weeks 07/02/21  Yes TaLavonna MonarchMD  dicyclomine (BENTYL) 20 MG tablet Take 1 tablet (20 mg total) by mouth every 6 (six) hours as needed for spasms. 03/27/20  Yes MuMarrian SalvageFNP  Evolocumab (REPATHA SURECLICK) [id number]/ML SOAJ Inject 140 mg into the skin every 14 (fourteen) days. 03/29/22  Yes JoMartiniquePeter M, MD  ezetimibe (ZETIA) 10 MG tablet Take 1 tablet (10 mg total) by mouth daily. 03/29/22 03/24/23 Yes JoMartiniquePeter M, MD  gabapentin (NEURONTIN) 300 MG capsule Take 1 capsule (300 mg total) by mouth at bedtime. 04/05/21  Yes SaHorald PollenMD  metFORMIN (GLUCOPHAGE) 500 MG tablet Take 1 tablet (500 mg total) by mouth daily with breakfast. 02/26/22  Yes BeLeafy RoCaren D, MD  metoprolol tartrate (LOPRESSOR) 25 MG tablet Take 0.5 tablets (12.5 mg total) by mouth 2 (two) times daily. 03/29/22  Yes JoMartiniquePeter M, MD  nitroGLYCERIN (NITROSTAT) 0.4 MG SL tablet Place 1 tablet (0.4 mg total) under the tongue  every 5 (five) minutes as needed for chest pain. 10/07/18  Yes JoMartiniquePeter M, MD  pantoprazole (PROTONIX) 40 MG tablet Take 1 tablet (40 mg total) by mouth daily. 04/05/21  Yes Deitrick Ferreri, MiInes BloomerMD  ramipril (ALTACE) 10 MG capsule Take 1 capsule (10 mg total) by mouth daily. 03/29/22  Yes JoMartiniquePeter M, MD  rosuvastatin (CRESTOR) 40 MG tablet Take 1 tablet (40 mg total) by mouth daily. 03/29/22  Yes JoMartiniquePeter M, MD  Vitamin D, Ergocalciferol, (DRISDOL) 1.25 MG (50000 UNIT) CAPS capsule Take 1 capsule (50,000 Units total) by mouth every 7 (seven) days. 02/26/22  Yes BeDennard Nip, MD    Allergies  Allergen Reactions   Erythromycin Hives   Penicillins Hives    Has patient had a PCN reaction causing immediate rash, facial/tongue/throat swelling, SOB or lightheadedness with hypotension: No Has patient had a PCN reaction causing severe rash involving mucus membranes or skin necrosis: No Has patient had a PCN reaction that required hospitalization: No Has patient had a PCN reaction occurring within the last 10 years: No If all of the above answers are "NO", then may proceed with Cephalosporin use.    Shellfish Allergy Hives    Patient Active Problem List   Diagnosis Date Noted   BMI 31.0-31.9,adult 03/26/2022   Obesity, Beginning BMI 30.66 03/26/2022   Insulin resistance 02/26/2022   Chronic pain of right knee 10/15/2021  Stress 08/30/2021   Prediabetes 07/11/2021   Degenerative disc disease, lumbar 05/19/2020   Restless leg syndrome 05/15/2020   Degenerative disc disease, cervical 02/02/2020   Polyarthralgia 01/03/2020   Patellofemoral arthritis of right knee 06/16/2019   Vitamin D deficiency, Vit D = 43.2 (01/25/19), Rx vit D 50K IU every 2 weeks 08/04/2018   Cyst of right breast 05/07/2018   Chronic headache disorder 02/08/2018   Essential hypertension 11/17/2017   Class 1 obesity with serious comorbidity and body mass index (BMI) of 30.0 to 30.9 in adult 07/15/2017    Insomnia 07/15/2017   Female stress incontinence 07/15/2017   Gastroesophageal reflux disease with esophagitis, Rx Protonix 02/25/2017   Osteopenia 06/27/2015   Osteoarthritis of right hip 07/08/2014   Carotid artery disease ([place])    Aortic valve sclerosis    Hyperlipidemia    Coronary artery disease     Past Medical History:  Diagnosis Date   Aortic valve sclerosis    echo [id number], soft systolic murmur   Arthritis    in right hip   Back pain    Basal cell carcinoma 05/31/2016   bcc left forehead  tx exc   Carotid artery disease ([facility])    40-59% bilateral, doppler December 2010   Cataract    Constipation    Coronary artery disease    a. BMS-mid RCA 11/2007 b. stress echo 02/2009 subtle inferior HK, lateral ST depressions with stress c. follow-up cath 02/2009: RCA stent patent, severe but stable stenosis of distal posterior lateral branch RCA. LV normal, med tx unless more sx c. ETT Myoview 08/27/12: negative for ischemia; EF 64%   Decreased hearing    Depression    Mild, August, 2012   Dyslipidemia    Easy bruising    Ejection fraction    EF normal, catheterization, 2011 /  EF 55%, echo, 2010   Fluid overload    Mild, stable since hospitalization in the past   GERD (gastroesophageal reflux disease)    occarionally, takes Pepcid over the counter   Heart murmur    has, occasional PVC's   History of right hip replacement    Hyperlipidemia    Hypertension    Joint pain    Myocardial infarction ([place])    2009   Palpitations    PVC (premature ventricular contraction)    per prior Holter monitor   Right hip pain 12/2009   Shellfish allergy    Stress fracture    Left foot   Trouble in sleeping    Vitamin B12 deficiency    Vitamin D deficiency     Past Surgical History:  Procedure Laterality Date   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/01/2005   BREAST BIOPSY Left    carcinoid tumor removal     colon-   CARDIAC CATHETERIZATION  1/211   Patent RCA stent, stenotic PLB supplying small  distribution; medically managed   CATARACT EXTRACTION, BILATERAL     CORONARY ANGIOPLASTY WITH STENT PLACEMENT  11/2007   BMS-mid RCA   DIAGNOSTIC LAPAROSCOPY     1982 for endometriosis   DILATION AND CURETTAGE OF UTERUS  2005   SKIN CANCER EXCISION     TOTAL HIP ARTHROPLASTY Right 07/08/2014   Procedure: RIGHT TOTAL HIP ARTHROPLASTY ANTERIOR APPROACH;  Surgeon: Mcarthur Rossetti, MD;  Location: [facility];  Service: Orthopedics;  Laterality: Right;   WISDOM TOOTH EXTRACTION      Social History   Socioeconomic History   Marital status: Divorced    Spouse name: Not  on file   Number of children: 1   Years of education: 63   Highest education level: Not on file  Occupational History   Occupation: Retired - Retail buyer  Tobacco Use   Smoking status: Never   Smokeless tobacco: Never  Vaping Use   Vaping Use: Never used  Substance and Sexual Activity   Alcohol use: Yes    Comment: 1 glass of wine 3x/week   Drug use: No   Sexual activity: Yes  Other Topics Concern   Not on file  Social History Narrative   Right handed   Soda- sometimes   Drinks herbal tea   Fun: Dance, garden,    Denies religious beliefs effecting health care.   Denies abuse and feels safe at home   Social Determinants of Health   Financial Resource Strain: Low Risk  (09/24/2021)   Overall Financial Resource Strain (CARDIA)    Difficulty of Paying Living Expenses: Not hard at all  Food Insecurity: No Food Insecurity (09/24/2021)   Hunger Vital Sign    Worried About Running Out of Food in the Last Year: Never true    Ran Out of Food in the Last Year: Never true  Transportation Needs: No Transportation Needs (09/24/2021)   PRAPARE - Hydrologist (Medical): No    Lack of Transportation (Non-Medical): No  Physical Activity: Inactive (09/24/2021)   Exercise Vital Sign    Days of Exercise per Week: 0 days    Minutes of Exercise per Session: 0 min  Stress: No Stress Concern  Present (09/24/2021)   [place]    Feeling of Stress : Not at all  Social Connections: Moderately Isolated (09/24/2021)   Social Connection and Isolation Panel [NHANES]    Frequency of Communication with Friends and Family: Three times a week    Frequency of Social Gatherings with Friends and Family: Three times a week    Attends Religious Services: Never    Active Member of Clubs or Organizations: No    Attends Archivist Meetings: Never    Marital Status: Living with partner  Intimate Partner Violence: Not At Risk (09/24/2021)   Humiliation, Afraid, Rape, and Kick questionnaire    Fear of Current or Ex-Partner: No    Emotionally Abused: No    Physically Abused: No    Sexually Abused: No    Family History  Problem Relation Age of Onset   Heart attack Father 10   Hyperlipidemia Father    Hypertension Father    Sudden death Father    Stroke Mother    Hyperlipidemia Mother    Hypertension Mother    Heart disease Mother    Obesity Mother    Heart attack Paternal Grandfather    Heart disease Sister    BRCA 1/2 Neg Hx    Breast cancer Neg Hx    Esophageal cancer Neg Hx    Colon cancer Neg Hx    Pancreatic cancer Neg Hx    Liver disease Neg Hx    Stomach cancer Neg Hx      Review of Systems  Constitutional: Negative.  Negative for chills and fever.  HENT: Negative.  Negative for congestion and sore throat.   Respiratory: Negative.  Negative for cough and shortness of breath.   Cardiovascular: Negative.  Negative for chest pain and palpitations.  Gastrointestinal:  Negative for abdominal pain, nausea and vomiting.  Genitourinary: Negative.  Negative for dysuria  and hematuria.  Musculoskeletal: Negative.   Skin: Negative.  Negative for rash.  Psychiatric/Behavioral:  The patient has insomnia.   All other systems reviewed and are negative.   Today's Vitals   04/17/22 1042  BP: 126/68  Pulse: (!)  47  Temp: 97.6 F (36.4 C)  TempSrc: Oral  SpO2: 97%  Weight: 165 lb 2 oz (74.9 kg)  Height: 5' (1.524 m)   Body mass index is 32.25 kg/m. Wt Readings from Last 3 Encounters:  04/17/22 165 lb 2 oz (74.9 kg)  03/29/22 164 lb (74.4 kg)  03/26/22 160 lb (72.6 kg)    Physical Exam Vitals reviewed.  Constitutional:      Appearance: Normal appearance.  HENT:     Head: Normocephalic.  Eyes:     Extraocular Movements: Extraocular movements intact.     Pupils: Pupils are equal, round, and reactive to light.  Cardiovascular:     Rate and Rhythm: Normal rate and regular rhythm.     Pulses: Normal pulses.     Heart sounds: Murmur heard.  Pulmonary:     Effort: Pulmonary effort is normal.     Breath sounds: Normal breath sounds.  Musculoskeletal:     Cervical back: No tenderness.  Lymphadenopathy:     Cervical: No cervical adenopathy.  Skin:    General: Skin is warm and dry.  Neurological:     General: No focal deficit present.     Mental Status: She is alert and oriented to person, place, and time.  Psychiatric:        Mood and Affect: Mood normal.        Behavior: Behavior normal.      ASSESSMENT & PLAN: A total of 46 minutes was spent with the patient and counseling/coordination of care regarding preparing for this visit, review of most recent office visit notes, review of most recent specialists office visit notes, review of most recent blood work results, review of multiple chronic medical conditions and their management, review of all medications, cardiovascular risks associated with hypertension and dyslipidemia, education on nutrition, review of health maintenance items, prognosis, documentation and need for follow-up.  Problem List Items Addressed This Visit       Cardiovascular and Mediastinum   Coronary artery disease    Stable.  No recent anginal episodes.  No need for nitroglycerin use. Continues daily baby aspirin and beta-blocker with metoprolol tartrate  12.5 mg twice a day      Essential hypertension    Well-controlled hypertension. Continue ramipril 10 mg daily and metoprolol tartrate 12.5 mg twice a day Cardiovascular risks associated with hypertension discussed. Dietary approaches to stop hypertension discussed. BP Readings from Last 3 Encounters:  04/17/22 126/68  03/29/22 130/68  03/26/22 (!) 149/67          Digestive   Gastroesophageal reflux disease with esophagitis, Rx Protonix    Stable.  Asymptomatic.  Continues pantoprazole 40 mg daily.        Other   Dyslipidemia    Able chronic condition.  Normal recently done lipid profile. Continue rosuvastatin 40 mg daily.       Insomnia - Primary    Active and affecting quality of life. Over-the-counter sleep aids not working. Sleep hygiene measures discussed. Recommend Ambien 5 mg at bedtime.      Prediabetes    Well-controlled.  Last hemoglobin A1c of 5.8. Diet and nutrition discussed.      Chronic pain of right knee    Active and affecting quality  of life. Follows up with orthopedist on a regular basis      Relevant Medications   gabapentin (NEURONTIN) 300 MG capsule   Other Visit Diagnoses     Chronic insomnia       Relevant Medications   zolpidem (AMBIEN) 5 MG tablet      Patient Instructions  Health Maintenance After Age 53 After age 73, you are at a higher risk for certain long-term diseases and infections as well as injuries from falls. Falls are a major cause of broken bones and head injuries in people who are older than age 24. Getting regular preventive care can help to keep you healthy and well. Preventive care includes getting regular testing and making lifestyle changes as recommended by your health care provider. Talk with your health care provider about: Which screenings and tests you should have. A screening is a test that checks for a disease when you have no symptoms. A diet and exercise plan that is right for you. What should I know  about screenings and tests to prevent falls? Screening and testing are the best ways to find a health problem early. Early diagnosis and treatment give you the best chance of managing medical conditions that are common after age 35. Certain conditions and lifestyle choices may make you more likely to have a fall. Your health care provider may recommend: Regular vision checks. Poor vision and conditions such as cataracts can make you more likely to have a fall. If you wear glasses, make sure to get your prescription updated if your vision changes. Medicine review. Work with your health care provider to regularly review all of the medicines you are taking, including over-the-counter medicines. Ask your health care provider about any side effects that may make you more likely to have a fall. Tell your health care provider if any medicines that you take make you feel dizzy or sleepy. Strength and balance checks. Your health care provider may recommend certain tests to check your strength and balance while standing, walking, or changing positions. Foot health exam. Foot pain and numbness, as well as not wearing proper footwear, can make you more likely to have a fall. Screenings, including: Osteoporosis screening. Osteoporosis is a condition that causes the bones to get weaker and break more easily. Blood pressure screening. Blood pressure changes and medicines to control blood pressure can make you feel dizzy. Depression screening. You may be more likely to have a fall if you have a fear of falling, feel depressed, or feel unable to do activities that you used to do. Alcohol use screening. Using too much alcohol can affect your balance and may make you more likely to have a fall. Follow these instructions at home: Lifestyle Do not drink alcohol if: Your health care provider tells you not to drink. If you drink alcohol: Limit how much you have to: 0-1 drink a day for women. 0-2 drinks a day for  men. Know how much alcohol is in your drink. In the [place] one drink equals one 12 oz bottle of beer (355 mL), one 5 oz glass of wine (148 mL), or one 1 oz glass of hard liquor (44 mL). Do not use any products that contain nicotine or tobacco. These products include cigarettes, chewing tobacco, and vaping devices, such as e-cigarettes. If you need help quitting, ask your health care provider. Activity  Follow a regular exercise program to stay fit. This will help you maintain your balance. Ask your health care provider what types  of exercise are appropriate for you. If you need a cane or walker, use it as recommended by your health care provider. Wear supportive shoes that have nonskid soles. Safety  Remove any tripping hazards, such as rugs, cords, and clutter. Install safety equipment such as grab bars in bathrooms and safety rails on stairs. Keep rooms and walkways well-lit. General instructions Talk with your health care provider about your risks for falling. Tell your health care provider if: You fall. Be sure to tell your health care provider about all falls, even ones that seem minor. You feel dizzy, tiredness (fatigue), or off-balance. Take over-the-counter and prescription medicines only as told by your health care provider. These include supplements. Eat a healthy diet and maintain a healthy weight. A healthy diet includes low-fat dairy products, low-fat (lean) meats, and fiber from whole grains, beans, and lots of fruits and vegetables. Stay current with your vaccines. Schedule regular health, dental, and eye exams. Summary Having a healthy lifestyle and getting preventive care can help to protect your health and wellness after age 32. Screening and testing are the best way to find a health problem early and help you avoid having a fall. Early diagnosis and treatment give you the best chance for managing medical conditions that are more common for people who are older than age  70. Falls are a major cause of broken bones and head injuries in people who are older than age 50. Take precautions to prevent a fall at home. Work with your health care provider to learn what changes you can make to improve your health and wellness and to prevent falls. This information is not intended to replace advice given to you by your health care provider. Make sure you discuss any questions you have with your health care provider. Document Revised: 06/26/2020 Document Reviewed: 06/26/2020 Elsevier Patient Education  [place], MD [facility]

## 2022-04-17 NOTE — Assessment & Plan Note (Deleted)
Stable.  No recent anginal episodes.  No need for nitroglycerin use. Continues daily baby aspirin and beta-blocker with metoprolol tartrate 12.5 mg twice a day

## 2022-04-30 ENCOUNTER — Encounter (INDEPENDENT_AMBULATORY_CARE_PROVIDER_SITE_OTHER): Payer: Self-pay | Admitting: Family Medicine

## 2022-04-30 ENCOUNTER — Encounter: Payer: Self-pay | Admitting: Emergency Medicine

## 2022-04-30 ENCOUNTER — Ambulatory Visit (INDEPENDENT_AMBULATORY_CARE_PROVIDER_SITE_OTHER): Payer: Medicare HMO | Admitting: Family Medicine

## 2022-04-30 VITALS — BP 132/62 | HR 48 | Temp 97.6°F | Ht 60.0 in | Wt 159.0 lb

## 2022-04-30 DIAGNOSIS — M25561 Pain in right knee: Secondary | ICD-10-CM

## 2022-04-30 DIAGNOSIS — R7303 Prediabetes: Secondary | ICD-10-CM | POA: Diagnosis not present

## 2022-04-30 DIAGNOSIS — G8929 Other chronic pain: Secondary | ICD-10-CM | POA: Diagnosis not present

## 2022-04-30 DIAGNOSIS — E559 Vitamin D deficiency, unspecified: Secondary | ICD-10-CM | POA: Diagnosis not present

## 2022-04-30 DIAGNOSIS — E88819 Insulin resistance, unspecified: Secondary | ICD-10-CM

## 2022-04-30 DIAGNOSIS — E669 Obesity, unspecified: Secondary | ICD-10-CM | POA: Diagnosis not present

## 2022-04-30 DIAGNOSIS — Z6831 Body mass index (BMI) 31.0-31.9, adult: Secondary | ICD-10-CM | POA: Diagnosis not present

## 2022-04-30 MED ORDER — PANTOPRAZOLE SODIUM 40 MG PO TBEC: 40.0000 mg | DELAYED_RELEASE_TABLET | Freq: Every day | ORAL | 3 refills | Status: DC

## 2022-05-01 MED ORDER — METFORMIN HCL 500 MG PO TABS: 500.0000 mg | ORAL_TABLET | Freq: Two times a day (BID) | ORAL | 0 refills | Status: DC

## 2022-05-01 MED ORDER — VITAMIN D (ERGOCALCIFEROL) 1.25 MG (50000 UNIT) PO CAPS: 50000.0000 [IU] | ORAL_CAPSULE | ORAL | 0 refills | Status: DC

## 2022-05-06 DIAGNOSIS — M545 Low back pain, unspecified: Secondary | ICD-10-CM | POA: Diagnosis not present

## 2022-05-06 DIAGNOSIS — M1711 Unilateral primary osteoarthritis, right knee: Secondary | ICD-10-CM | POA: Diagnosis not present

## 2022-05-08 NOTE — Progress Notes (Unsigned)
Chief Complaint:   OBESITY Courtney [name] is here to discuss her progress with her obesity treatment plan along with follow-up of her obesity related diagnoses. Courtney [name] is on the Category 1 Plan and states she is following her eating plan approximately 90% of the time. Courtney [name] states she is walking, going to the gym, and doing water exercise for 40 minutes 6 times per week.  Today's visit was #: 97 Starting weight: 157 lbs Starting date: 06/16/2017 Today's weight: 159 lbs Today's date: 04/30/2022 Total lbs lost to date: 0 Total lbs lost since last in-office visit: 1  Interim History: Courtney [name] continues to work on her weight loss.  She is struggling with knee pain and she is trying to increase her exercise, but this is limited by her pain.  Subjective:   1. Chronic pain of right knee Courtney [name] is doing physical therapy and exercise daily.  She is unable to get the ablation due to lack of insurance coverage.  She is hoping to get into a knee pain study.  2. Vitamin D deficiency Courtney [name] is on vitamin D with no side effects noted.  She has no signs of over replacement.  3. Pre-diabetes Courtney [name] is struggling with polyphagia at times.  She is on metformin once daily with no nausea or vomiting.  Assessment/Plan:   1. Chronic pain of right knee Courtney [name] was encouraged to discuss further options with Courtney [name].  She is to continue walking but not pushing herself too hard.  2. Vitamin D deficiency Courtney [name] will continue prescription vitamin D, and we will refill for 1 month.  - Vitamin D, Ergocalciferol, (DRISDOL) 1.25 MG (50000 UNIT) CAPS capsule; Take 1 capsule (50,000 Units total) by mouth every 7 (seven) days.  Dispense: 4 capsule; Refill: 0  3. Pre-diabetes Courtney [name] agreed to increase metformin to 500 mg twice daily, and we will refill for 1 month.  She will continue to work on her diet and exercise.  - metFORMIN (GLUCOPHAGE) 500 MG tablet; Take 1 tablet (500 mg total) by mouth 2 (two) times daily with a  meal.  Dispense: 60 tablet; Refill: 0  4. BMI 31.0-31.9,adult  5. Obesity, Beginning BMI 30.66 Courtney [name] is currently in the action stage of change. As such, her goal is to continue with weight loss efforts. She has agreed to the Category 1 Plan.   Exercise goals: As is.   Behavioral modification strategies: increasing lean protein intake.  Courtney [name] has agreed to follow-up with our clinic in 4 weeks. She was informed of the importance of frequent follow-up visits to maximize her success with intensive lifestyle modifications for her multiple health conditions.   Objective:   Blood pressure 132/62, pulse (!) 48, temperature 97.6 F (36.4 C), height 5' (1.524 m), weight 159 lb (72.1 kg), SpO2 97 %. Body mass index is 31.05 kg/m.  Lab Results  Component Value Date   CREATININE 0.83 02/26/2022   BUN 17 02/26/2022   NA 141 02/26/2022   K 4.8 02/26/2022   CL 105 02/26/2022   CO2 23 02/26/2022   Lab Results  Component Value Date   ALT 12 02/26/2022   AST 18 02/26/2022   ALKPHOS 62 02/26/2022   BILITOT 0.3 02/26/2022   Lab Results  Component Value Date   HGBA1C 5.8 (H) 02/26/2022   HGBA1C 5.8 (H) 10/18/2021   HGBA1C 5.7 (H) 07/11/2021   HGBA1C 6.0 (H) 03/20/2021   HGBA1C 5.8 (H) 08/30/2020   Lab Results  Component Value Date   INSULIN  10.2 02/26/2022   INSULIN 6.7 10/18/2021   INSULIN 9.0 07/11/2021   INSULIN 8.2 03/20/2021   INSULIN 6.8 08/30/2020   Lab Results  Component Value Date   TSH 1.020 02/26/2022   Lab Results  Component Value Date   CHOL 127 02/26/2022   HDL 73 02/26/2022   LDLCALC 35 02/26/2022   TRIG 103 02/26/2022   CHOLHDL 1.6 05/15/2021   Lab Results  Component Value Date   VD25OH 83.1 02/26/2022   VD25OH 63.8 10/18/2021   VD25OH 63.1 07/11/2021   Lab Results  Component Value Date   WBC 7.3 01/03/2020   HGB 13.0 01/03/2020   HCT 37.7 01/03/2020   MCV 91.5 01/03/2020   PLT 196.0 01/03/2020   Lab Results  Component Value Date   IRON 117  10/17/2020   TIBC 431.2 10/17/2020   FERRITIN 133.2 10/17/2020   Attestation Statements:   Reviewed by clinician on day of visit: allergies, medications, problem list, medical history, surgical history, family history, social history, and previous encounter notes.   I, Trixie Dredge, am acting as transcriptionist for Dennard Nip, MD.  I have reviewed the above documentation for accuracy and completeness, and I agree with the above. -  Dennard Nip, MD

## 2022-05-10 ENCOUNTER — Other Ambulatory Visit: Payer: Self-pay | Admitting: Emergency Medicine

## 2022-05-16 DIAGNOSIS — M47816 Spondylosis without myelopathy or radiculopathy, lumbar region: Secondary | ICD-10-CM | POA: Diagnosis not present

## 2022-05-23 ENCOUNTER — Telehealth: Payer: Self-pay | Admitting: Urgent Care

## 2022-05-23 DIAGNOSIS — J00 Acute nasopharyngitis [common cold]: Secondary | ICD-10-CM

## 2022-05-23 MED ORDER — PREDNISONE 10 MG (21) PO TBPK: ORAL_TABLET | Freq: Every day | ORAL | 0 refills | Status: DC

## 2022-05-23 MED ORDER — IPRATROPIUM BROMIDE 0.03 % NA SOLN: 2.0000 | Freq: Two times a day (BID) | NASAL | 0 refills | Status: DC

## 2022-05-23 MED ORDER — BENZONATATE 100 MG PO CAPS: 100.0000 mg | ORAL_CAPSULE | Freq: Two times a day (BID) | ORAL | 0 refills | Status: DC | PRN

## 2022-05-23 NOTE — Progress Notes (Signed)
E-Visit for Upper Respiratory Infection   We are sorry you are not feeling well.  Here is how we plan to help!  Based on what you have shared with me, it looks like you may have a viral upper respiratory infection.  Upper respiratory infections are caused by a large number of viruses; however, rhinovirus is the most common cause.   Symptoms vary from person to person, with common symptoms including sore throat, cough, fatigue or lack of energy and feeling of general discomfort.  A low-grade fever of up to 100.4 may present, but is often uncommon.  Symptoms vary however, and are closely related to a person's age or underlying illnesses.  The most common symptoms associated with an upper respiratory infection are nasal discharge or congestion, cough, sneezing, headache and pressure in the ears and face.  These symptoms usually persist for about 3 to 10 days, but can last up to 2 weeks.  It is important to know that upper respiratory infections do not cause serious illness or complications in most cases.    Upper respiratory infections can be transmitted from person to person, with the most common method of transmission being a person's hands.  The virus is able to live on the skin and can infect other persons for up to 2 hours after direct contact.  Also, these can be transmitted when someone coughs or sneezes; thus, it is important to cover the mouth to reduce this risk.  To keep the spread of the illness at [place], good hand hygiene is very important.  This is an infection that is most likely caused by a virus. There are no specific treatments other than to help you with the symptoms until the infection runs its course.  We are sorry you are not feeling well.  Here is how we plan to help!   For nasal congestion, you may use an oral decongestants such as Mucinex D or if you have glaucoma or high blood pressure use plain Mucinex.  Saline nasal spray or nasal drops can help and can safely be used as often as  needed for congestion.  For your congestion, I have prescribed Ipratropium Bromide nasal spray 0.03% two sprays in each nostril 2-3 times a day  If you do not have a history of heart disease, hypertension, diabetes or thyroid disease, prostate/bladder issues or glaucoma, you may also use Sudafed to treat nasal congestion.  It is highly recommended that you consult with a pharmacist or your primary care physician to ensure this medication is safe for you to take.     If you have a cough, you may use cough suppressants such as Delsym and Robitussin.  If you have glaucoma or high blood pressure, you can also use Coricidin HBP.   For cough I have prescribed for you A prescription cough medication called Tessalon Perles 100 mg. You may take 1-2 capsules every 8 hours as needed for cough.  Additionally, I have sent in a prednisone dose pack to help with any lower respiratory symptoms. Please take this per package directions, each day taking one less pill than the day prior.   If you have a sore or scratchy throat, use a saltwater gargle-  to  teaspoon of salt dissolved in a 4-ounce to 8-ounce glass of warm water.  Gargle the solution for approximately 15-30 seconds and then spit.  It is important not to swallow the solution.  You can also use throat lozenges/cough drops and Chloraseptic spray to help with  throat pain or discomfort.  Warm or cold liquids can also be helpful in relieving throat pain.  For headache, pain or general discomfort, you can use Ibuprofen or Tylenol as directed.   Some authorities believe that zinc sprays or the use of Echinacea may shorten the course of your symptoms.   HOME CARE Only take medications as instructed by your medical team. Be sure to drink plenty of fluids. Water is fine as well as fruit juices, sodas and electrolyte beverages. You may want to stay away from caffeine or alcohol. If you are nauseated, try taking small sips of liquids. How do you know if you are  getting enough fluid? Your urine should be a pale yellow or almost colorless. Get rest. Taking a steamy shower or using a humidifier may help nasal congestion and ease sore throat pain. You can place a towel over your head and breathe in the steam from hot water coming from a faucet. Using a saline nasal spray works much the same way. Cough drops, hard candies and sore throat lozenges may ease your cough. Avoid close contacts especially the very young and the elderly Cover your mouth if you cough or sneeze Always remember to wash your hands.   GET HELP RIGHT AWAY IF: You develop worsening fever. If your symptoms do not improve within 10 days You develop yellow or green discharge from your nose over 3 days. You have coughing fits You develop a severe head ache or visual changes. You develop shortness of breath, difficulty breathing or start having chest pain Your symptoms persist after you have completed your treatment plan  MAKE SURE YOU  Understand these instructions. Will watch your condition. Will get help right away if you are not doing well or get worse.  Thank you for choosing an e-visit.  Your e-visit answers were reviewed by a board certified advanced clinical practitioner to complete your personal care plan. Depending upon the condition, your plan could have included both over the counter or prescription medications.  Please review your pharmacy choice. Make sure the pharmacy is open so you can pick up prescription now. If there is a problem, you may contact your provider through [employer] and have the prescription routed to another pharmacy.  Your safety is important to [place]. If you have drug allergies check your prescription carefully.   For the next 24 hours you can use MyChart to ask questions about today's visit, request a non-urgent call back, or ask for a work or school excuse. You will get an email in the next two days asking about your experience. I hope that  your e-visit has been valuable and will speed your recovery.    I have spent 5 minutes in review of e-visit questionnaire, review and updating patient chart, medical decision making and response to patient.   [place], PA

## 2022-05-27 ENCOUNTER — Ambulatory Visit
Admission: RE | Admit: 2022-05-27 | Discharge: 2022-05-27 | Disposition: A | Discharge status: Home / Self Care | Payer: Medicare HMO | Source: Ambulatory Visit | Attending: Obstetrics and Gynecology | Admitting: Obstetrics and Gynecology

## 2022-05-27 DIAGNOSIS — Z1231 Encounter for screening mammogram for malignant neoplasm of breast: Secondary | ICD-10-CM

## 2022-05-29 ENCOUNTER — Ambulatory Visit (INDEPENDENT_AMBULATORY_CARE_PROVIDER_SITE_OTHER): Payer: Medicare HMO | Admitting: Family Medicine

## 2022-05-30 ENCOUNTER — Telehealth: Payer: Medicare HMO | Admitting: Physician Assistant

## 2022-05-30 DIAGNOSIS — J208 Acute bronchitis due to other specified organisms: Secondary | ICD-10-CM | POA: Diagnosis not present

## 2022-05-30 DIAGNOSIS — B9689 Other specified bacterial agents as the cause of diseases classified elsewhere: Secondary | ICD-10-CM

## 2022-05-30 MED ORDER — DOXYCYCLINE HYCLATE 100 MG PO TABS: 100.0000 mg | ORAL_TABLET | Freq: Two times a day (BID) | ORAL | 0 refills | Status: DC

## 2022-05-30 NOTE — Progress Notes (Signed)

## 2022-06-03 ENCOUNTER — Encounter: Payer: Self-pay | Admitting: Emergency Medicine

## 2022-06-03 ENCOUNTER — Ambulatory Visit (INDEPENDENT_AMBULATORY_CARE_PROVIDER_SITE_OTHER): Payer: Medicare HMO

## 2022-06-03 ENCOUNTER — Ambulatory Visit (INDEPENDENT_AMBULATORY_CARE_PROVIDER_SITE_OTHER): Payer: Medicare HMO | Admitting: Emergency Medicine

## 2022-06-03 VITALS — BP 126/72 | HR 53 | Temp 98.0°F | Ht 60.0 in | Wt 164.4 lb

## 2022-06-03 DIAGNOSIS — J22 Unspecified acute lower respiratory infection: Secondary | ICD-10-CM | POA: Diagnosis not present

## 2022-06-03 DIAGNOSIS — R051 Acute cough: Secondary | ICD-10-CM

## 2022-06-03 DIAGNOSIS — R059 Cough, unspecified: Secondary | ICD-10-CM | POA: Diagnosis not present

## 2022-06-03 NOTE — Assessment & Plan Note (Signed)
Clinically stable and running its course No complications No pneumonia on chest x-ray Continue and finish doxycycline ED precautions given

## 2022-06-03 NOTE — Patient Instructions (Signed)
  Acute Bronchitis, Adult  Acute bronchitis is when air tubes in the lungs (bronchi) suddenly get swollen. The condition can make it hard for you to breathe. In adults, acute bronchitis usually goes away within 2 weeks. A cough caused by bronchitis may last up to 3 weeks. Smoking, allergies, and asthma can make the condition worse. What are the causes? Germs that cause cold and flu (viruses). The most common cause of this condition is the virus that causes the common cold. Bacteria. Substances that bother (irritate) the lungs, including: Smoke from cigarettes and other types of tobacco. Dust and pollen. Fumes from chemicals, gases, or burned fuel. Indoor or outdoor air pollution. What increases the risk? A weak body's defense system. This is also called the immune system. Any condition that affects your lungs and breathing, such as asthma. What are the signs or symptoms? A cough. Coughing up clear, yellow, or green mucus. Making high-pitched whistling sounds when you breathe, most often when you breathe out (wheezing). Runny or stuffy nose. Having too much mucus in your lungs (chest congestion). Shortness of breath. Body aches. A sore throat. How is this treated? Acute bronchitis may go away over time without treatment. Your doctor may tell you to: Drink more fluids. This will help thin your mucus so it is easier to cough up. Use a device that gets medicine into your lungs (inhaler). Use a vaporizer or a humidifier. These are machines that add water to the air. This helps with coughing and poor breathing. Take a medicine that thins mucus and helps clear it from your lungs. Take a medicine that prevents or stops coughing. It is not common to take an antibiotic medicine for this condition. Follow these instructions at home:  Take over-the-counter and prescription medicines only as told by your doctor. Use an inhaler, vaporizer, or humidifier as told by your doctor. Take two  teaspoons (10 mL) of honey at bedtime. This helps lessen your coughing at night. Drink enough fluid to keep your pee (urine) pale yellow. Do not smoke or use any products that contain nicotine or tobacco. If you need help quitting, ask your doctor. Get a lot of rest. Return to your normal activities when your doctor says that it is safe. Keep all follow-up visits. How is this prevented?  Wash your hands often with soap and water for at least 20 seconds. If you cannot use soap and water, use hand sanitizer. Avoid contact with people who have cold symptoms. Try not to touch your mouth, nose, or eyes with your hands. Avoid breathing in smoke or chemical fumes. Make sure to get the flu shot every year. Contact a doctor if: Your symptoms do not get better in 2 weeks. You have trouble coughing up the mucus. Your cough keeps you awake at night. You have a fever. Get help right away if: You cough up blood. You have chest pain. You have very bad shortness of breath. You faint or keep feeling like you are going to faint. You have a very bad headache. Your fever or chills get worse. These symptoms may be an emergency. Get help right away. Call your local emergency services (911 in the [place] Do not wait to see if the symptoms will go away. Do not drive yourself to the hospital. Summary Acute bronchitis is when air tubes in the lungs (bronchi) suddenly get swollen. In adults, acute bronchitis usually goes away within 2 weeks. Drink more fluids. This will help thin your mucus so it   is easier to cough up. Take over-the-counter and prescription medicines only as told by your doctor. Contact a doctor if your symptoms do not improve after 2 weeks of treatment. This information is not intended to replace advice given to you by your health care provider. Make sure you discuss any questions you have with your health care provider. Document Revised: 06/07/2020 Document Reviewed: 06/07/2020 [employer]  Patient Education  2023 [employer]  

## 2022-06-03 NOTE — Progress Notes (Signed)
Courtney [name] 72 y.o.   Chief Complaint  Patient presents with   Cough    Patient states she did an E visit was on prednisone, burning and chest pain, did a evist on Friday, patient is started on  ABX      HISTORY OF PRESENT ILLNESS: Acute problem visit today. This is a 72 y.o. female here for follow-up of virtual visit Flulike symptoms that started on 05/22/2022 with shortness of breath, chest tightness, wheezing, productive cough Was started on prednisone and doxycycline Feeling better, at least 50% Prednisone helped chronic right knee pain No other complaints or medical concerns today.  Cough Associated symptoms include wheezing. Pertinent negatives include no chest pain, chills, fever, headaches, hemoptysis, sore throat or shortness of breath.     Prior to Admission medications   Medication Sig Start Date End Date Taking? Authorizing Provider  aspirin 81 MG tablet Take 81 mg by mouth daily.   Yes [provider]  benzonatate (TESSALON) 100 MG capsule Take 1 capsule (100 mg total) by mouth 2 (two) times daily as needed for cough. 05/23/22  Yes Crain, Whitney L, PA  clobetasol cream (TEMOVATE) 0.05 % Apply 1-2 times a day to poison oak for 2 weeks 07/02/21  Yes Janalyn Harder, MD  dicyclomine (BENTYL) 20 MG tablet TAKE 1 TABLET BY MOUTH EVERY 6 HOURS AS NEEDED FOR SPASMS. 05/11/22  Yes Aneisa Karren, Eilleen Kempf, MD  doxycycline (VIBRA-TABS) 100 MG tablet Take 1 tablet (100 mg total) by mouth 2 (two) times daily. 05/30/22  Yes Burnette, Victorino Dike M, PA-C  Evolocumab (REPATHA SURECLICK) 140 MG/ML SOAJ Inject 140 mg into the skin every 14 (fourteen) days. 03/29/22  Yes [place], Peter M, MD  ezetimibe (ZETIA) 10 MG tablet Take 1 tablet (10 mg total) by mouth daily. 03/29/22 03/24/23 Yes [place], Peter M, MD  gabapentin (NEURONTIN) 300 MG capsule Take 1 capsule (300 mg total) by mouth at bedtime. 04/17/22  Yes Jahon Bart, Eilleen Kempf, MD  metFORMIN (GLUCOPHAGE) 500 MG tablet Take 1 tablet (500 mg  total) by mouth 2 (two) times daily with a meal. 05/01/22  Yes Dalbert Garnet, Caren D, MD  metoprolol tartrate (LOPRESSOR) 25 MG tablet Take 0.5 tablets (12.5 mg total) by mouth 2 (two) times daily. 03/29/22  Yes [place], Peter M, MD  nitroGLYCERIN (NITROSTAT) 0.4 MG SL tablet Place 1 tablet (0.4 mg total) under the tongue every 5 (five) minutes as needed for chest pain. 10/07/18  Yes [place], Peter M, MD  pantoprazole (PROTONIX) 40 MG tablet Take 1 tablet (40 mg total) by mouth daily. 04/30/22  Yes Elma Limas, Eilleen Kempf, MD  ramipril (ALTACE) 10 MG capsule Take 1 capsule (10 mg total) by mouth daily. 03/29/22  Yes [place], Peter M, MD  rosuvastatin (CRESTOR) 40 MG tablet Take 1 tablet (40 mg total) by mouth daily. 03/29/22  Yes [place], Peter M, MD  Vitamin D, Ergocalciferol, (DRISDOL) 1.25 MG (50000 UNIT) CAPS capsule Take 1 capsule (50,000 Units total) by mouth every 7 (seven) days. 05/01/22  Yes Beasley, Caren D, MD  zolpidem (AMBIEN) 5 MG tablet Take 1 tablet (5 mg total) by mouth at bedtime as needed for sleep. 04/17/22  Yes Zabella Wease, Eilleen Kempf, MD  ipratropium (ATROVENT) 0.03 % nasal spray Place 2 sprays into both nostrils every 12 (twelve) hours. Patient not taking: Reported on 06/03/2022 05/23/22   Guy Sandifer L, PA    Allergies  Allergen Reactions   Erythromycin Hives   Penicillins Hives    Has patient had a PCN  reaction causing immediate rash, facial/tongue/throat swelling, SOB or lightheadedness with hypotension: No Has patient had a PCN reaction causing severe rash involving mucus membranes or skin necrosis: No Has patient had a PCN reaction that required hospitalization: No Has patient had a PCN reaction occurring within the last 10 years: No If all of the above answers are "NO", then may proceed with Cephalosporin use.    Shellfish Allergy Hives    Patient Active Problem List   Diagnosis Date Noted   BMI 31.0-31.9,adult 03/26/2022   Obesity, Beginning BMI 30.66 03/26/2022   Insulin  resistance 02/26/2022   Chronic pain of right knee 10/15/2021   Stress 08/30/2021   Prediabetes 07/11/2021   Degenerative disc disease, lumbar 05/19/2020   Restless leg syndrome 05/15/2020   Degenerative disc disease, cervical 02/02/2020   Polyarthralgia 01/03/2020   Patellofemoral arthritis of right knee 06/16/2019   Vitamin D deficiency, Vit D = 43.2 (01/25/19), Rx vit D 50K IU every 2 weeks 08/04/2018   Cyst of right breast 05/07/2018   Chronic headache disorder 02/08/2018   Essential hypertension 11/17/2017   Class 1 obesity with serious comorbidity and body mass index (BMI) of 30.0 to 30.9 in adult 07/15/2017   Insomnia 07/15/2017   Female stress incontinence 07/15/2017   Gastroesophageal reflux disease with esophagitis, Rx Protonix 02/25/2017   Osteopenia 06/27/2015   Osteoarthritis of right hip 07/08/2014   Carotid artery disease    Aortic valve sclerosis    Dyslipidemia    Coronary artery disease     Past Medical History:  Diagnosis Date   Aortic valve sclerosis    echo 12/2008, soft systolic murmur   Arthritis    in right hip   Back pain    Basal cell carcinoma 05/31/2016   bcc left forehead  tx exc   Carotid artery disease    40-59% bilateral, doppler December 2010   Cataract    Constipation    Coronary artery disease    a. BMS-mid RCA 11/2007 b. stress echo 02/2009 subtle inferior HK, lateral ST depressions with stress c. follow-up cath 02/2009: RCA stent patent, severe but stable stenosis of distal posterior lateral branch RCA. LV normal, med tx unless more sx c. ETT Myoview 08/27/12: negative for ischemia; EF 64%   Decreased hearing    Depression    Mild, August, 2012   Dyslipidemia    Easy bruising    Ejection fraction    EF normal, catheterization, 2011 /  EF 55%, echo, 2010   Fluid overload    Mild, stable since hospitalization in the past   GERD (gastroesophageal reflux disease)    occarionally, takes Pepcid over the counter   Heart murmur    has,  occasional PVC's   History of right hip replacement    Hyperlipidemia    Hypertension    Joint pain    Myocardial infarction    2009   Palpitations    PVC (premature ventricular contraction)    per prior Holter monitor   Right hip pain 12/2009   Shellfish allergy    Stress fracture    Left foot   Trouble in sleeping    Vitamin B12 deficiency    Vitamin D deficiency     Past Surgical History:  Procedure Laterality Date   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/01/2005   BREAST BIOPSY Left    carcinoid tumor removal     colon-   CARDIAC CATHETERIZATION  1/211   Patent RCA stent, stenotic PLB supplying small distribution; medically  managed   CATARACT EXTRACTION, BILATERAL     CORONARY ANGIOPLASTY WITH STENT PLACEMENT  11/2007   BMS-mid RCA   DIAGNOSTIC LAPAROSCOPY     1982 for endometriosis   DILATION AND CURETTAGE OF UTERUS  2005   SKIN CANCER EXCISION     TOTAL HIP ARTHROPLASTY Right 07/08/2014   Procedure: RIGHT TOTAL HIP ARTHROPLASTY ANTERIOR APPROACH;  Surgeon: Kathryne Hitch, MD;  Location: [facility];  Service: Orthopedics;  Laterality: Right;   WISDOM TOOTH EXTRACTION      Social History   Socioeconomic History   Marital status: Divorced    Spouse name: Not on file   Number of children: 1   Years of education: 46   Highest education level: Not on file  Occupational History   Occupation: Retired - Publishing copy  Tobacco Use   Smoking status: Never   Smokeless tobacco: Never  Vaping Use   Vaping Use: Never used  Substance and Sexual Activity   Alcohol use: Yes    Comment: 1 glass of wine 3x/week   Drug use: No   Sexual activity: Yes  Other Topics Concern   Not on file  Social History Narrative   Right handed   Soda- sometimes   Drinks herbal tea   Fun: Dance, garden,    Denies religious beliefs effecting health care.   Denies abuse and feels safe at home   Social Determinants of Health   Financial Resource Strain: Low Risk  (09/24/2021)   Overall Financial  Resource Strain (CARDIA)    Difficulty of Paying Living Expenses: Not hard at all  Food Insecurity: No Food Insecurity (09/24/2021)   Hunger Vital Sign    Worried About Running Out of Food in the Last Year: Never true    Ran Out of Food in the Last Year: Never true  Transportation Needs: No Transportation Needs (09/24/2021)   PRAPARE - Administrator, Civil Service (Medical): No    Lack of Transportation (Non-Medical): No  Physical Activity: Inactive (09/24/2021)   Exercise Vital Sign    Days of Exercise per Week: 0 days    Minutes of Exercise per Session: 0 min  Stress: No Stress Concern Present (09/24/2021)   [employer] of Occupational Health - Occupational Stress Questionnaire    Feeling of Stress : Not at all  Social Connections: Moderately Isolated (09/24/2021)   Social Connection and Isolation Panel [NHANES]    Frequency of Communication with Friends and Family: Three times a week    Frequency of Social Gatherings with Friends and Family: Three times a week    Attends Religious Services: Never    Active Member of Clubs or Organizations: No    Attends Banker Meetings: Never    Marital Status: Living with partner  Intimate Partner Violence: Not At Risk (09/24/2021)   Humiliation, Afraid, Rape, and Kick questionnaire    Fear of Current or Ex-Partner: No    Emotionally Abused: No    Physically Abused: No    Sexually Abused: No    Family History  Problem Relation Age of Onset   Heart attack Father 52   Hyperlipidemia Father    Hypertension Father    Sudden death Father    Stroke Mother    Hyperlipidemia Mother    Hypertension Mother    Heart disease Mother    Obesity Mother    Heart attack Paternal Grandfather    Heart disease Sister    BRCA 1/2 Neg Hx  Breast cancer Neg Hx    Esophageal cancer Neg Hx    Colon cancer Neg Hx    Pancreatic cancer Neg Hx    Liver disease Neg Hx    Stomach cancer Neg Hx      Review of Systems   Constitutional: Negative.  Negative for chills and fever.  HENT:  Positive for congestion. Negative for sore throat.   Respiratory:  Positive for cough and wheezing. Negative for hemoptysis, sputum production and shortness of breath.   Cardiovascular: Negative.  Negative for chest pain and palpitations.  Gastrointestinal:  Negative for abdominal pain, diarrhea, nausea and vomiting.  Genitourinary: Negative.  Negative for dysuria and hematuria.  Skin: Negative.   Neurological: Negative.  Negative for dizziness and headaches.  All other systems reviewed and are negative.   Vitals:   06/03/22 1027  BP: 126/72  Pulse: (!) 53  Temp: 98 F (36.7 C)  SpO2: 94%    Physical Exam Vitals reviewed.  Constitutional:      Appearance: Normal appearance.  HENT:     Head: Normocephalic.     Mouth/Throat:     Mouth: Mucous membranes are moist.     Pharynx: Oropharynx is clear.  Eyes:     Extraocular Movements: Extraocular movements intact.     Conjunctiva/sclera: Conjunctivae normal.     Pupils: Pupils are equal, round, and reactive to light.  Cardiovascular:     Rate and Rhythm: Normal rate and regular rhythm.     Pulses: Normal pulses.     Heart sounds: Normal heart sounds.  Pulmonary:     Effort: Pulmonary effort is normal.     Breath sounds: Normal breath sounds.  Musculoskeletal:     Cervical back: No tenderness.  Lymphadenopathy:     Cervical: No cervical adenopathy.  Skin:    General: Skin is warm and dry.     Capillary Refill: Capillary refill takes less than 2 seconds.  Neurological:     General: No focal deficit present.     Mental Status: She is alert and oriented to person, place, and time.  Psychiatric:        Mood and Affect: Mood normal.        Behavior: Behavior normal.    DG Chest 2 View  Result Date: 06/03/2022 CLINICAL DATA:  Cough for 2 weeks. EXAM: CHEST - 2 VIEW COMPARISON:  08/26/2012. FINDINGS: Cardiac silhouette is normal in size. No mediastinal or  hilar masses. No evidence of adenopathy. Minor linear opacity in the left mid lung consistent with atelectasis/scarring. Lungs otherwise clear. No pleural effusion or pneumothorax. Skeletal structures are intact. IMPRESSION: No active cardiopulmonary disease. Electronically Signed   By: Amie Portland [name]   On: 06/03/2022 11:32     ASSESSMENT & PLAN: A total of 33 minutes was spent with the patient and counseling/coordination of care regarding preparing for this visit, review of most recent office visit notes, review of chronic medical conditions under management, review of all medications, review of chest x-ray report done today, diagnosis of lower respiratory infection and need to continue and finish antibiotics, cough management, prognosis, documentation, and need for follow-up.  Problem List Items Addressed This Visit       Respiratory   Lower respiratory infection - Primary    Clinically stable and running its course No complications No pneumonia on chest x-ray Continue and finish doxycycline ED precautions given      Relevant Orders   DG Chest 2 View (Completed)  Other   Acute cough    Cough management discussed. Continue over-the-counter Mucinex DM and cough drops Advised to rest and stay well-hydrated Continue cough syrup prescribed during virtual visit last weekend.      Patient Instructions  Acute Bronchitis, Adult  Acute bronchitis is when air tubes in the lungs (bronchi) suddenly get swollen. The condition can make it hard for you to breathe. In adults, acute bronchitis usually goes away within 2 weeks. A cough caused by bronchitis may last up to 3 weeks. Smoking, allergies, and asthma can make the condition worse. What are the causes? Germs that cause cold and flu (viruses). The most common cause of this condition is the virus that causes the common cold. Bacteria. Substances that bother (irritate) the lungs, including: Smoke from cigarettes and other types of  tobacco. Dust and pollen. Fumes from chemicals, gases, or burned fuel. Indoor or outdoor air pollution. What increases the risk? A weak body's defense system. This is also called the immune system. Any condition that affects your lungs and breathing, such as asthma. What are the signs or symptoms? A cough. Coughing up clear, yellow, or green mucus. Making high-pitched whistling sounds when you breathe, most often when you breathe out (wheezing). Runny or stuffy nose. Having too much mucus in your lungs (chest congestion). Shortness of breath. Body aches. A sore throat. How is this treated? Acute bronchitis may go away over time without treatment. Your doctor may tell you to: Drink more fluids. This will help thin your mucus so it is easier to cough up. Use a device that gets medicine into your lungs (inhaler). Use a vaporizer or a humidifier. These are machines that add water to the air. This helps with coughing and poor breathing. Take a medicine that thins mucus and helps clear it from your lungs. Take a medicine that prevents or stops coughing. It is not common to take an antibiotic medicine for this condition. Follow these instructions at home:  Take over-the-counter and prescription medicines only as told by your doctor. Use an inhaler, vaporizer, or humidifier as told by your doctor. Take two teaspoons (10 mL) of honey at bedtime. This helps lessen your coughing at night. Drink enough fluid to keep your pee (urine) pale yellow. Do not smoke or use any products that contain nicotine or tobacco. If you need help quitting, ask your doctor. Get a lot of rest. Return to your normal activities when your doctor says that it is safe. Keep all follow-up visits. How is this prevented?  Wash your hands often with soap and water for at least 20 seconds. If you cannot use soap and water, use hand sanitizer. Avoid contact with people who have cold symptoms. Try not to touch your mouth,  nose, or eyes with your hands. Avoid breathing in smoke or chemical fumes. Make sure to get the flu shot every year. Contact a doctor if: Your symptoms do not get better in 2 weeks. You have trouble coughing up the mucus. Your cough keeps you awake at night. You have a fever. Get help right away if: You cough up blood. You have chest pain. You have very bad shortness of breath. You faint or keep feeling like you are going to faint. You have a very bad headache. Your fever or chills get worse. These symptoms may be an emergency. Get help right away. Call your local emergency services (911 in the [place] Do not wait to see if the symptoms will go away. Do not  drive yourself to the hospital. Summary Acute bronchitis is when air tubes in the lungs (bronchi) suddenly get swollen. In adults, acute bronchitis usually goes away within 2 weeks. Drink more fluids. This will help thin your mucus so it is easier to cough up. Take over-the-counter and prescription medicines only as told by your doctor. Contact a doctor if your symptoms do not improve after 2 weeks of treatment. This information is not intended to replace advice given to you by your health care provider. Make sure you discuss any questions you have with your health care provider. Document Revised: 06/07/2020 Document Reviewed: 06/07/2020 Elsevier Patient Education  2023 Elsevier Inc.    Edwina Barth, MD [facility]

## 2022-06-03 NOTE — Assessment & Plan Note (Signed)
Cough management discussed. Continue over-the-counter Mucinex DM and cough drops Advised to rest and stay well-hydrated Continue cough syrup prescribed during virtual visit last weekend.

## 2022-06-06 ENCOUNTER — Encounter (INDEPENDENT_AMBULATORY_CARE_PROVIDER_SITE_OTHER): Payer: Self-pay | Admitting: Family Medicine

## 2022-06-06 ENCOUNTER — Ambulatory Visit (INDEPENDENT_AMBULATORY_CARE_PROVIDER_SITE_OTHER): Payer: Medicare HMO | Admitting: Family Medicine

## 2022-06-06 VITALS — BP 129/52 | HR 60 | Temp 97.5°F | Ht 60.0 in | Wt 159.0 lb

## 2022-06-06 DIAGNOSIS — Z6831 Body mass index (BMI) 31.0-31.9, adult: Secondary | ICD-10-CM | POA: Diagnosis not present

## 2022-06-06 DIAGNOSIS — R7303 Prediabetes: Secondary | ICD-10-CM

## 2022-06-06 DIAGNOSIS — E669 Obesity, unspecified: Secondary | ICD-10-CM

## 2022-06-06 DIAGNOSIS — E559 Vitamin D deficiency, unspecified: Secondary | ICD-10-CM

## 2022-06-06 MED ORDER — VITAMIN D (ERGOCALCIFEROL) 1.25 MG (50000 UNIT) PO CAPS: 50000.0000 [IU] | ORAL_CAPSULE | ORAL | 0 refills | Status: DC

## 2022-06-06 MED ORDER — METFORMIN HCL 500 MG PO TABS: 500.0000 mg | ORAL_TABLET | Freq: Two times a day (BID) | ORAL | 0 refills | Status: DC

## 2022-06-11 NOTE — Progress Notes (Signed)
Chief Complaint:   OBESITY Courtney [name] is here to discuss her progress with her obesity treatment plan along with follow-up of her obesity related diagnoses. Courtney [name] is on the Category 1 Plan and states she is following her eating plan approximately 80% of the time. Courtney [name] states she is doing 0 minutes 0 times per week.  Today's visit was #: 63 Starting weight: 157 lbs Starting date: 06/16/2017 Today's weight: 159 lbs Today's date: 06/06/2022 Total lbs lost to date: 0 Total lbs lost since last in-office visit: 0  Interim History: Courtney [name] has been struggling with following her plan especially when she has not been feeling well.  She is skipping breakfast and she is tired of her meal plan.  She is eating out more for lunch.  She has also been on prednisone since her last visit.  Subjective:   1. Pre-diabetes Courtney [name] is on metformin, but she has been on prednisone this last month which likely has affected her glucose levels.  2. Vitamin D deficiency Courtney [name] is on vitamin D with no side effects noted.  She requests a refill today.  Assessment/Plan:   1. Pre-diabetes We will refill metformin for 1 month, and Courtney [name] will get back to her diet.  - metFORMIN (GLUCOPHAGE) 500 MG tablet; Take 1 tablet (500 mg total) by mouth 2 (two) times daily with a meal.  Dispense: 60 tablet; Refill: 0  2. Vitamin D deficiency Courtney [name] will continue prescription vitamin D, and we will refill for 1 month.  - Vitamin D, Ergocalciferol, (DRISDOL) 1.25 MG (50000 UNIT) CAPS capsule; Take 1 capsule (50,000 Units total) by mouth every 7 (seven) days.  Dispense: 4 capsule; Refill: 0  3. BMI 31.0-31.9,adult  4. Obesity, Beginning BMI 30.66 Courtney [name] is currently in the action stage of change. As such, her goal is to continue with weight loss efforts. She has agreed to change to the Vegetarian Plan.   Exercise goals: Courtney [name] will get back to her exercise routine when she feels better.  Behavioral modification strategies:  increasing lean protein intake and decreasing simple carbohydrates.  Courtney [name] has agreed to follow-up with our clinic in 4 weeks. She was informed of the importance of frequent follow-up visits to maximize her success with intensive lifestyle modifications for her multiple health conditions.   Objective:   Blood pressure (!) 129/52, pulse 60, temperature (!) 97.5 F (36.4 C), height 5' (1.524 m), weight 159 lb (72.1 kg), SpO2 98 %. Body mass index is 31.05 kg/m.  Lab Results  Component Value Date   CREATININE 0.83 02/26/2022   BUN 17 02/26/2022   NA 141 02/26/2022   K 4.8 02/26/2022   CL 105 02/26/2022   CO2 23 02/26/2022   Lab Results  Component Value Date   ALT 12 02/26/2022   AST 18 02/26/2022   ALKPHOS 62 02/26/2022   BILITOT 0.3 02/26/2022   Lab Results  Component Value Date   HGBA1C 5.8 (H) 02/26/2022   HGBA1C 5.8 (H) 10/18/2021   HGBA1C 5.7 (H) 07/11/2021   HGBA1C 6.0 (H) 03/20/2021   HGBA1C 5.8 (H) 08/30/2020   Lab Results  Component Value Date   INSULIN 10.2 02/26/2022   INSULIN 6.7 10/18/2021   INSULIN 9.0 07/11/2021   INSULIN 8.2 03/20/2021   INSULIN 6.8 08/30/2020   Lab Results  Component Value Date   TSH 1.020 02/26/2022   Lab Results  Component Value Date   CHOL 127 02/26/2022   HDL 73 02/26/2022   LDLCALC 35 02/26/2022  TRIG 103 02/26/2022   CHOLHDL 1.6 05/15/2021   Lab Results  Component Value Date   VD25OH 83.1 02/26/2022   VD25OH 63.8 10/18/2021   VD25OH 63.1 07/11/2021   Lab Results  Component Value Date   WBC 7.3 01/03/2020   HGB 13.0 01/03/2020   HCT 37.7 01/03/2020   MCV 91.5 01/03/2020   PLT 196.0 01/03/2020   Lab Results  Component Value Date   IRON 117 10/17/2020   TIBC 431.2 10/17/2020   FERRITIN 133.2 10/17/2020   Attestation Statements:   Reviewed by clinician on day of visit: allergies, medications, problem list, medical history, surgical history, family history, social history, and previous encounter  notes.   I, Burt Knack, am acting as transcriptionist for Quillian Quince, MD.  I have reviewed the above documentation for accuracy and completeness, and I agree with the above. -  Quillian Quince, MD

## 2022-06-19 DIAGNOSIS — M47816 Spondylosis without myelopathy or radiculopathy, lumbar region: Secondary | ICD-10-CM | POA: Diagnosis not present

## 2022-07-02 DIAGNOSIS — M47817 Spondylosis without myelopathy or radiculopathy, lumbosacral region: Secondary | ICD-10-CM | POA: Diagnosis not present

## 2022-07-02 DIAGNOSIS — M47816 Spondylosis without myelopathy or radiculopathy, lumbar region: Secondary | ICD-10-CM | POA: Diagnosis not present

## 2022-07-03 ENCOUNTER — Ambulatory Visit: Payer: Medicare HMO | Admitting: Dermatology

## 2022-07-10 ENCOUNTER — Encounter (INDEPENDENT_AMBULATORY_CARE_PROVIDER_SITE_OTHER): Payer: Self-pay | Admitting: Family Medicine

## 2022-07-10 ENCOUNTER — Ambulatory Visit (INDEPENDENT_AMBULATORY_CARE_PROVIDER_SITE_OTHER): Payer: Medicare HMO | Admitting: Family Medicine

## 2022-07-10 VITALS — BP 111/57 | HR 50 | Temp 97.6°F | Ht 60.0 in | Wt 156.0 lb

## 2022-07-10 DIAGNOSIS — E669 Obesity, unspecified: Secondary | ICD-10-CM | POA: Diagnosis not present

## 2022-07-10 DIAGNOSIS — R7303 Prediabetes: Secondary | ICD-10-CM | POA: Diagnosis not present

## 2022-07-10 DIAGNOSIS — Z6830 Body mass index (BMI) 30.0-30.9, adult: Secondary | ICD-10-CM | POA: Diagnosis not present

## 2022-07-10 DIAGNOSIS — E559 Vitamin D deficiency, unspecified: Secondary | ICD-10-CM

## 2022-07-10 MED ORDER — VITAMIN D (ERGOCALCIFEROL) 1.25 MG (50000 UNIT) PO CAPS: 50000.0000 [IU] | ORAL_CAPSULE | ORAL | 0 refills | Status: DC

## 2022-07-16 NOTE — Progress Notes (Signed)
Chief Complaint:   OBESITY Courtney [name] is here to discuss her progress with her obesity treatment plan along with follow-up of her obesity related diagnoses. Courtney [name] is on the Vegetarian Plan and states she is following her eating plan approximately 85% of the time. Courtney [name] states she is working in the garden and at [employer]   Today's visit was #: 64 Starting weight: 157 lbs Starting date: 06/16/2017 Today's weight: 156 lbs Today's date: 07/10/2022 Total lbs lost to date: 1 Total lbs lost since last in-office visit: 3  Interim History: Patient has done well with her weight loss. She notes her appetite has decreased, and she struggles with eating all of the food on her plan.   Subjective:   1. Vitamin D deficiency Patient is on Vitamin D, and she is doing well with no side effects.   2. Pre-diabetes Patient's recent A1c was elevated at 5.8. She is on metformin and she was increased to BID at her last visit. She denies nausea or vomiting.   Assessment/Plan:   1. Vitamin D deficiency We will refill prescription Vitamin D, and we will recheck labs in 2 months.   - Vitamin D, Ergocalciferol, (DRISDOL) 1.25 MG (50000 UNIT) CAPS capsule; Take 1 capsule (50,000 Units total) by mouth every 7 (seven) days.  Dispense: 4 capsule; Refill: 0  2. Pre-diabetes Patient will continue metformin 500 mg BID as well as her diet and exercise, and we will recheck labs in 2 months.   3. BMI 30.0-30.9,adult  4. Obesity, Beginning BMI 30.66 Courtney [name] is currently in the action stage of change. As such, her goal is to continue with weight loss efforts. She has agreed to the Vegetarian Plan.   Patient is ok to use protein shakes as needed.   Exercise goals: As is.   Behavioral modification strategies: increasing lean protein intake.  Courtney [name] has agreed to follow-up with our clinic in 4 weeks. She was informed of the importance of frequent follow-up visits to maximize her success with intensive lifestyle  modifications for her multiple health conditions.   Objective:   Blood pressure (!) 111/57, pulse (!) 50, temperature 97.6 F (36.4 C), height 5' (1.524 m), weight 156 lb (70.8 kg), SpO2 95 %. Body mass index is 30.47 kg/m.  Lab Results  Component Value Date   CREATININE 0.83 02/26/2022   BUN 17 02/26/2022   NA 141 02/26/2022   K 4.8 02/26/2022   CL 105 02/26/2022   CO2 23 02/26/2022   Lab Results  Component Value Date   ALT 12 02/26/2022   AST 18 02/26/2022   ALKPHOS 62 02/26/2022   BILITOT 0.3 02/26/2022   Lab Results  Component Value Date   HGBA1C 5.8 (H) 02/26/2022   HGBA1C 5.8 (H) 10/18/2021   HGBA1C 5.7 (H) 07/11/2021   HGBA1C 6.0 (H) 03/20/2021   HGBA1C 5.8 (H) 08/30/2020   Lab Results  Component Value Date   INSULIN 10.2 02/26/2022   INSULIN 6.7 10/18/2021   INSULIN 9.0 07/11/2021   INSULIN 8.2 03/20/2021   INSULIN 6.8 08/30/2020   Lab Results  Component Value Date   TSH 1.020 02/26/2022   Lab Results  Component Value Date   CHOL 127 02/26/2022   HDL 73 02/26/2022   LDLCALC 35 02/26/2022   TRIG 103 02/26/2022   CHOLHDL 1.6 05/15/2021   Lab Results  Component Value Date   VD25OH 83.1 02/26/2022   VD25OH 63.8 10/18/2021   VD25OH 63.1 07/11/2021   Lab Results  Component Value Date   WBC 7.3 01/03/2020   HGB 13.0 01/03/2020   HCT 37.7 01/03/2020   MCV 91.5 01/03/2020   PLT 196.0 01/03/2020   Lab Results  Component Value Date   IRON 117 10/17/2020   TIBC 431.2 10/17/2020   FERRITIN 133.2 10/17/2020   Attestation Statements:   Reviewed by clinician on day of visit: allergies, medications, problem list, medical history, surgical history, family history, social history, and previous encounter notes.   I, Burt Knack, am acting as transcriptionist for Quillian Quince, MD.  I have reviewed the above documentation for accuracy and completeness, and I agree with the above. -  Quillian Quince, MD

## 2022-07-25 ENCOUNTER — Encounter: Payer: Self-pay | Admitting: Emergency Medicine

## 2022-07-25 ENCOUNTER — Ambulatory Visit (INDEPENDENT_AMBULATORY_CARE_PROVIDER_SITE_OTHER): Payer: Medicare HMO | Admitting: Emergency Medicine

## 2022-07-25 VITALS — BP 140/80 | HR 100 | Temp 98.3°F | Ht 60.0 in | Wt 158.0 lb

## 2022-07-25 DIAGNOSIS — I10 Essential (primary) hypertension: Secondary | ICD-10-CM | POA: Diagnosis not present

## 2022-07-25 DIAGNOSIS — R1030 Lower abdominal pain, unspecified: Secondary | ICD-10-CM | POA: Diagnosis not present

## 2022-07-25 LAB — URINALYSIS
Bilirubin Urine: NEGATIVE
Ketones, ur: NEGATIVE
Leukocytes,Ua: NEGATIVE
Nitrite: NEGATIVE
Specific Gravity, Urine: 1.01 (ref 1.000–1.030)
Total Protein, Urine: NEGATIVE
Urine Glucose: NEGATIVE
Urobilinogen, UA: 0.2 (ref 0.0–1.0)
pH: 5.5 (ref 5.0–8.0)

## 2022-07-25 LAB — COMPREHENSIVE METABOLIC PANEL
ALT: 12 U/L (ref 0–35)
AST: 18 U/L (ref 0–37)
Albumin: 4.4 g/dL (ref 3.5–5.2)
Alkaline Phosphatase: 53 U/L (ref 39–117)
BUN: 22 mg/dL (ref 6–23)
CO2: 26 mEq/L (ref 19–32)
Calcium: 9.3 mg/dL (ref 8.4–10.5)
Chloride: 104 mEq/L (ref 96–112)
Creatinine, Ser: 0.86 mg/dL (ref 0.40–1.20)
GFR: 66.98 mL/min (ref >60.00)
Glucose, Bld: 92 mg/dL (ref 70–99)
Potassium: 4.4 mEq/L (ref 3.5–5.1)
Sodium: 137 mEq/L (ref 135–145)
Total Bilirubin: 0.4 mg/dL (ref 0.2–1.2)
Total Protein: 7 g/dL (ref 6.0–8.3)

## 2022-07-25 LAB — CBC WITH DIFFERENTIAL/PLATELET
Basophils Absolute: 0 10*3/uL (ref 0.0–0.1)
Basophils Relative: 0.5 % (ref 0.0–3.0)
Eosinophils Absolute: 0.2 10*3/uL (ref 0.0–0.7)
Eosinophils Relative: 2.6 % (ref 0.0–5.0)
HCT: 37.1 % (ref 36.0–46.0)
Hemoglobin: 12.4 g/dL (ref 12.0–15.0)
Lymphocytes Relative: 40.8 % (ref 12.0–46.0)
Lymphs Abs: 3.3 10*3/uL (ref 0.7–4.0)
MCHC: 33.4 g/dL (ref 30.0–36.0)
MCV: 93.4 fl (ref 78.0–100.0)
Monocytes Absolute: 0.5 10*3/uL (ref 0.1–1.0)
Monocytes Relative: 6.8 % (ref 3.0–12.0)
Neutro Abs: 3.9 10*3/uL (ref 1.4–7.7)
Neutrophils Relative %: 49.3 % (ref 43.0–77.0)
Platelets: 229 10*3/uL (ref 150.0–400.0)
RBC: 3.97 Mil/uL (ref 3.87–5.11)
RDW: 13.3 % (ref 11.5–15.5)
WBC: 8 10*3/uL (ref 4.0–10.5)

## 2022-07-25 NOTE — Patient Instructions (Signed)
Abdominal Pain, Adult  Many things can cause belly (abdominal) pain. In most cases, belly pain is not a serious problem and can be watched and treated at home. But in some cases, it can be serious. Your doctor will try to find the cause of your belly pain. Follow these instructions at home: Medicines Take over-the-counter and prescription medicines only as told by your doctor. Do not take medicines that help you poop (laxatives) unless told by your doctor. General instructions Watch your belly pain for any changes. Tell your doctor if the pain gets worse. Drink enough fluid to keep your pee (urine) pale yellow. Contact a doctor if: Your belly pain changes or gets worse. You have very bad cramping or bloating in your belly. You vomit. Your pain gets worse with meals, after eating, or with certain foods. You have trouble pooping or have watery poop for more than 2-3 days. You are not hungry, or you lose weight without trying. You have signs of not getting enough fluid or water (dehydration). These may include: Dark pee, very little pee, or no pee. Cracked lips or dry mouth. Feeling sleepy or weak. You have pain when you pee or poop. Your belly pain wakes you up at night. You have blood in your pee. You have a fever. Get help right away if: You cannot stop vomiting. Your pain is only in one part of your belly, like on the right side. You have bloody or black poop, or poop that looks like tar. You have trouble breathing. You have chest pain. These symptoms may be an emergency. Get help right away. Call 911. Do not wait to see if the symptoms will go away. Do not drive yourself to the hospital. This information is not intended to replace advice given to you by your health care provider. Make sure you discuss any questions you have with your health care provider. Document Revised: 11/21/2021 Document Reviewed: 11/21/2021 Elsevier Patient Education  2024 Elsevier Inc.  

## 2022-07-25 NOTE — Assessment & Plan Note (Signed)
Clinically stable.  Benign abdominal examination Afebrile with normal vital signs.  Asymptomatic today Colonoscopy report from 2015 reviewed.  No diverticulosis then. Differential diagnosis discussed Blood work done today Recommend CT scan of abdomen and pelvis May need referral to GI and repeat colonoscopy

## 2022-07-25 NOTE — Assessment & Plan Note (Signed)
BP Readings from Last 3 Encounters:  07/25/22 (!) 140/80  07/10/22 (!) 111/57  06/06/22 (!) 129/52  Elevated blood pressure reading in the office today but normal at home Continue ramipril 10 mg daily and metoprolol tartrate 12.5 mg twice a day Advised to monitor blood pressure readings at home daily for the next several weeks and keep a log.  Advised to contact the office if numbers persistently abnormal.

## 2022-07-25 NOTE — Progress Notes (Signed)
Courtney [name] 72 y.o.   Chief Complaint  Patient presents with   Abdominal Pain    Pt states that after eating she has a gnawing feeling. She tries to stay away from food with acid.    HISTORY OF PRESENT ILLNESS: Acute problem visit today. This is a 72 y.o. female complaining of lower abdominal pain that started last weekend and lasted about 3 to 4 days.  Better today. No associated symptoms.  No urinary symptoms.  Able to move bowels.  No rectal bleeding.  No diarrhea. Denies fever or chills. Colonoscopy report from 2015 does not show diverticulosis. No other complaints or medical concerns today.  Abdominal Pain Pertinent negatives include no constipation, diarrhea, dysuria, fever, headaches, hematuria, melena, nausea or vomiting.     Prior to Admission medications   Medication Sig Start Date End Date Taking? Authorizing Provider  aspirin 81 MG tablet Take 81 mg by mouth daily.    [provider]  benzonatate (TESSALON) 100 MG capsule Take 1 capsule (100 mg total) by mouth 2 (two) times daily as needed for cough. 05/23/22   Crain, Whitney L, PA  clobetasol cream (TEMOVATE) 0.05 % Apply 1-2 times a day to poison oak for 2 weeks 07/02/21   Janalyn Harder, MD  doxycycline (VIBRA-TABS) 100 MG tablet Take 1 tablet (100 mg total) by mouth 2 (two) times daily. 05/30/22   Margaretann Loveless, PA-C  Evolocumab (REPATHA SURECLICK) 140 MG/ML SOAJ Inject 140 mg into the skin every 14 (fourteen) days. 03/29/22   [place], Peter M, MD  ezetimibe (ZETIA) 10 MG tablet Take 1 tablet (10 mg total) by mouth daily. 03/29/22 03/24/23  [place], Peter M, MD  gabapentin (NEURONTIN) 300 MG capsule Take 1 capsule (300 mg total) by mouth at bedtime. 04/17/22   Georgina Quint, MD  metFORMIN (GLUCOPHAGE) 500 MG tablet Take 1 tablet (500 mg total) by mouth 2 (two) times daily with a meal. 06/06/22   Quillian Quince D, MD  metoprolol tartrate (LOPRESSOR) 25 MG tablet Take 0.5 tablets (12.5 mg total) by mouth 2  (two) times daily. 03/29/22   [place], Peter M, MD  nitroGLYCERIN (NITROSTAT) 0.4 MG SL tablet Place 1 tablet (0.4 mg total) under the tongue every 5 (five) minutes as needed for chest pain. 10/07/18   [place], Peter M, MD  pantoprazole (PROTONIX) 40 MG tablet Take 1 tablet (40 mg total) by mouth daily. 04/30/22   Georgina Quint, MD  ramipril (ALTACE) 10 MG capsule Take 1 capsule (10 mg total) by mouth daily. 03/29/22   [place], Peter M, MD  rosuvastatin (CRESTOR) 40 MG tablet Take 1 tablet (40 mg total) by mouth daily. 03/29/22   [place], Peter M, MD  Vitamin D, Ergocalciferol, (DRISDOL) 1.25 MG (50000 UNIT) CAPS capsule Take 1 capsule (50,000 Units total) by mouth every 7 (seven) days. 07/10/22   Quillian Quince D, MD  zolpidem (AMBIEN) 5 MG tablet Take 1 tablet (5 mg total) by mouth at bedtime as needed for sleep. 04/17/22   Georgina Quint, MD    Allergies  Allergen Reactions   Erythromycin Hives   Penicillins Hives    Has patient had a PCN reaction causing immediate rash, facial/tongue/throat swelling, SOB or lightheadedness with hypotension: No Has patient had a PCN reaction causing severe rash involving mucus membranes or skin necrosis: No Has patient had a PCN reaction that required hospitalization: No Has patient had a PCN reaction occurring within the last 10 years: No If all of the  above answers are "NO", then may proceed with Cephalosporin use.    Shellfish Allergy Hives    Patient Active Problem List   Diagnosis Date Noted   BMI 31.0-31.9,adult 03/26/2022   Obesity, Beginning BMI 30.66 03/26/2022   Insulin resistance 02/26/2022   Chronic pain of right knee 10/15/2021   Stress 08/30/2021   Pre-diabetes 07/11/2021   Lower abdominal pain 12/12/2020   Degenerative disc disease, lumbar 05/19/2020   Restless leg syndrome 05/15/2020   Degenerative disc disease, cervical 02/02/2020   Polyarthralgia 01/03/2020   Patellofemoral arthritis of right knee 06/16/2019   Vitamin D  deficiency, Vit D = 43.2 (01/25/19), Rx vit D 50K IU every 2 weeks 08/04/2018   Cyst of right breast 05/07/2018   Chronic headache disorder 02/08/2018   Essential hypertension 11/17/2017   Class 1 obesity with serious comorbidity and body mass index (BMI) of 30.0 to 30.9 in adult 07/15/2017   Insomnia 07/15/2017   Female stress incontinence 07/15/2017   Gastroesophageal reflux disease with esophagitis, Rx Protonix 02/25/2017   Osteopenia 06/27/2015   Osteoarthritis of right hip 07/08/2014   Carotid artery disease (HCC)    Aortic valve sclerosis    Dyslipidemia    Coronary artery disease     Past Medical History:  Diagnosis Date   Aortic valve sclerosis    echo 12/2008, soft systolic murmur   Arthritis    in right hip   Back pain    Basal cell carcinoma 05/31/2016   bcc left forehead  tx exc   Carotid artery disease (HCC)    40-59% bilateral, doppler December 2010   Cataract    Constipation    Coronary artery disease    a. BMS-mid RCA 11/2007 b. stress echo 02/2009 subtle inferior HK, lateral ST depressions with stress c. follow-up cath 02/2009: RCA stent patent, severe but stable stenosis of distal posterior lateral branch RCA. LV normal, med tx unless more sx c. ETT Myoview 08/27/12: negative for ischemia; EF 64%   Decreased hearing    Depression    Mild, August, 2012   Dyslipidemia    Easy bruising    Ejection fraction    EF normal, catheterization, 2011 /  EF 55%, echo, 2010   Fluid overload    Mild, stable since hospitalization in the past   GERD (gastroesophageal reflux disease)    occarionally, takes Pepcid over the counter   Heart murmur    has, occasional PVC's   History of right hip replacement    Hyperlipidemia    Hypertension    Joint pain    Myocardial infarction (HCC)    2009   Palpitations    PVC (premature ventricular contraction)    per prior Holter monitor   Right hip pain 12/2009   Shellfish allergy    Stress fracture    Left foot   Trouble in  sleeping    Vitamin B12 deficiency    Vitamin D deficiency     Past Surgical History:  Procedure Laterality Date   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/01/2005   BREAST BIOPSY Left    carcinoid tumor removal     colon-   CARDIAC CATHETERIZATION  1/211   Patent RCA stent, stenotic PLB supplying small distribution; medically managed   CATARACT EXTRACTION, BILATERAL     CORONARY ANGIOPLASTY WITH STENT PLACEMENT  11/2007   BMS-mid RCA   DIAGNOSTIC LAPAROSCOPY     1982 for endometriosis   DILATION AND CURETTAGE OF UTERUS  2005   SKIN CANCER EXCISION  TOTAL HIP ARTHROPLASTY Right 07/08/2014   Procedure: RIGHT TOTAL HIP ARTHROPLASTY ANTERIOR APPROACH;  Surgeon: Kathryne Hitch, MD;  Location: [facility];  Service: Orthopedics;  Laterality: Right;   WISDOM TOOTH EXTRACTION      Social History   Socioeconomic History   Marital status: Divorced    Spouse name: Not on file   Number of children: 1   Years of education: 65   Highest education level: Master's degree (e.g., MA, MS, MEng, MEd, MSW, MBA)  Occupational History   Occupation: Retired - Publishing copy  Tobacco Use   Smoking status: Never   Smokeless tobacco: Never  Vaping Use   Vaping Use: Never used  Substance and Sexual Activity   Alcohol use: Yes    Comment: 1 glass of wine 3x/week   Drug use: No   Sexual activity: Yes  Other Topics Concern   Not on file  Social History Narrative   Right handed   Soda- sometimes   Drinks herbal tea   Fun: Dance, garden,    Denies religious beliefs effecting health care.   Denies abuse and feels safe at home   Social Determinants of Health   Financial Resource Strain: Low Risk  (09/24/2021)   Overall Financial Resource Strain (CARDIA)    Difficulty of Paying Living Expenses: Not hard at all  Food Insecurity: No Food Insecurity (07/23/2022)   Hunger Vital Sign    Worried About Running Out of Food in the Last Year: Never true    Ran Out of Food in the Last Year: Never true   Transportation Needs: No Transportation Needs (07/23/2022)   PRAPARE - Administrator, Civil Service (Medical): No    Lack of Transportation (Non-Medical): No  Physical Activity: Insufficiently Active (07/23/2022)   Exercise Vital Sign    Days of Exercise per Week: 2 days    Minutes of Exercise per Session: 30 min  Stress: No Stress Concern Present (07/23/2022)   [employer] of Occupational Health - Occupational Stress Questionnaire    Feeling of Stress : Only a little  Social Connections: Socially Isolated (07/23/2022)   Social Connection and Isolation Panel [NHANES]    Frequency of Communication with Friends and Family: More than three times a week    Frequency of Social Gatherings with Friends and Family: Three times a week    Attends Religious Services: Never    Active Member of Clubs or Organizations: No    Attends Banker Meetings: Never    Marital Status: Divorced  Catering manager Violence: Not At Risk (09/24/2021)   Humiliation, Afraid, Rape, and Kick questionnaire    Fear of Current or Ex-Partner: No    Emotionally Abused: No    Physically Abused: No    Sexually Abused: No    Family History  Problem Relation Age of Onset   Heart attack Father 56   Hyperlipidemia Father    Hypertension Father    Sudden death Father    Stroke Mother    Hyperlipidemia Mother    Hypertension Mother    Heart disease Mother    Obesity Mother    Heart attack Paternal Grandfather    Heart disease Sister    BRCA 1/2 Neg Hx    Breast cancer Neg Hx    Esophageal cancer Neg Hx    Colon cancer Neg Hx    Pancreatic cancer Neg Hx    Liver disease Neg Hx    Stomach cancer Neg Hx  Review of Systems  Constitutional: Negative.  Negative for chills and fever.  HENT: Negative.  Negative for congestion and sore throat.   Respiratory: Negative.  Negative for cough and shortness of breath.   Cardiovascular: Negative.  Negative for chest pain and palpitations.   Gastrointestinal:  Positive for abdominal pain. Negative for blood in stool, constipation, diarrhea, melena, nausea and vomiting.  Genitourinary: Negative.  Negative for dysuria and hematuria.  Skin: Negative.  Negative for rash.  Neurological: Negative.  Negative for dizziness and headaches.  All other systems reviewed and are negative.   Vitals:   07/25/22 1407 07/25/22 1410  BP: (!) 140/80 (!) 140/80  Pulse: 100   Temp: 98.3 F (36.8 C)   SpO2: 98%     Physical Exam Vitals reviewed.  Constitutional:      Appearance: She is well-developed.  HENT:     Head: Normocephalic.     Mouth/Throat:     Mouth: Mucous membranes are moist.     Pharynx: Oropharynx is clear.  Eyes:     Extraocular Movements: Extraocular movements intact.  Cardiovascular:     Rate and Rhythm: Normal rate.  Pulmonary:     Effort: Pulmonary effort is normal.  Abdominal:     General: There is no distension.     Palpations: Abdomen is soft.     Tenderness: There is no abdominal tenderness.  Skin:    General: Skin is warm and dry.     Capillary Refill: Capillary refill takes less than 2 seconds.  Neurological:     Mental Status: She is alert and oriented to person, place, and time.  Psychiatric:        Mood and Affect: Mood normal.        Behavior: Behavior normal.      ASSESSMENT & PLAN: A total of 40 minutes was spent with the patient and counseling/coordination of care regarding preparing for this visit, review of most recent office visit notes, review of multiple chronic medical conditions under management, review of all medications, differential diagnosis of lower abdominal pain and need for workup including CT scan of abdomen and pelvis, ED precautions, prognosis, education on nutrition, documentation and need for follow-up.  Problem List Items Addressed This Visit       Cardiovascular and Mediastinum   Essential hypertension    BP Readings from Last 3 Encounters:  07/25/22 (!) 140/80   07/10/22 (!) 111/57  06/06/22 (!) 129/52  Elevated blood pressure reading in the office today but normal at home Continue ramipril 10 mg daily and metoprolol tartrate 12.5 mg twice a day Advised to monitor blood pressure readings at home daily for the next several weeks and keep a log.  Advised to contact the office if numbers persistently abnormal.         Other   Lower abdominal pain - Primary    Clinically stable.  Benign abdominal examination Afebrile with normal vital signs.  Asymptomatic today Colonoscopy report from 2015 reviewed.  No diverticulosis then. Differential diagnosis discussed Blood work done today Recommend CT scan of abdomen and pelvis May need referral to GI and repeat colonoscopy      Relevant Orders   CBC with Differential/Platelet   Comprehensive metabolic panel   Urinalysis   CT Abdomen Pelvis W Contrast   Patient Instructions  Abdominal Pain, Adult  Many things can cause belly (abdominal) pain. In most cases, belly pain is not a serious problem and can be watched and treated at home. But in  some cases, it can be serious. Your doctor will try to find the cause of your belly pain. Follow these instructions at home: Medicines Take over-the-counter and prescription medicines only as told by your doctor. Do not take medicines that help you poop (laxatives) unless told by your doctor. General instructions Watch your belly pain for any changes. Tell your doctor if the pain gets worse. Drink enough fluid to keep your pee (urine) pale yellow. Contact a doctor if: Your belly pain changes or gets worse. You have very bad cramping or bloating in your belly. You vomit. Your pain gets worse with meals, after eating, or with certain foods. You have trouble pooping or have watery poop for more than 2-3 days. You are not hungry, or you lose weight without trying. You have signs of not getting enough fluid or water (dehydration). These may include: Dark pee,  very little pee, or no pee. Cracked lips or dry mouth. Feeling sleepy or weak. You have pain when you pee or poop. Your belly pain wakes you up at night. You have blood in your pee. You have a fever. Get help right away if: You cannot stop vomiting. Your pain is only in one part of your belly, like on the right side. You have bloody or black poop, or poop that looks like tar. You have trouble breathing. You have chest pain. These symptoms may be an emergency. Get help right away. Call 911. Do not wait to see if the symptoms will go away. Do not drive yourself to the hospital. This information is not intended to replace advice given to you by your health care provider. Make sure you discuss any questions you have with your health care provider. Document Revised: 11/21/2021 Document Reviewed: 11/21/2021 Elsevier Patient Education  2024 Elsevier Inc.      Edwina Barth, MD [facility]

## 2022-07-26 DIAGNOSIS — M25511 Pain in right shoulder: Secondary | ICD-10-CM | POA: Diagnosis not present

## 2022-07-26 DIAGNOSIS — M47816 Spondylosis without myelopathy or radiculopathy, lumbar region: Secondary | ICD-10-CM | POA: Diagnosis not present

## 2022-07-31 ENCOUNTER — Other Ambulatory Visit (INDEPENDENT_AMBULATORY_CARE_PROVIDER_SITE_OTHER): Payer: Self-pay | Admitting: Family Medicine

## 2022-07-31 DIAGNOSIS — E559 Vitamin D deficiency, unspecified: Secondary | ICD-10-CM

## 2022-07-31 DIAGNOSIS — H903 Sensorineural hearing loss, bilateral: Secondary | ICD-10-CM | POA: Diagnosis not present

## 2022-08-07 ENCOUNTER — Ambulatory Visit (INDEPENDENT_AMBULATORY_CARE_PROVIDER_SITE_OTHER): Payer: Medicare HMO | Admitting: Family Medicine

## 2022-08-07 ENCOUNTER — Encounter (INDEPENDENT_AMBULATORY_CARE_PROVIDER_SITE_OTHER): Payer: Self-pay | Admitting: Family Medicine

## 2022-08-07 VITALS — BP 139/74 | HR 57 | Temp 98.0°F | Ht 60.0 in | Wt 157.0 lb

## 2022-08-07 DIAGNOSIS — G4709 Other insomnia: Secondary | ICD-10-CM

## 2022-08-07 DIAGNOSIS — R7303 Prediabetes: Secondary | ICD-10-CM | POA: Diagnosis not present

## 2022-08-07 DIAGNOSIS — Z6830 Body mass index (BMI) 30.0-30.9, adult: Secondary | ICD-10-CM | POA: Diagnosis not present

## 2022-08-07 DIAGNOSIS — I10 Essential (primary) hypertension: Secondary | ICD-10-CM

## 2022-08-07 DIAGNOSIS — E669 Obesity, unspecified: Secondary | ICD-10-CM | POA: Diagnosis not present

## 2022-08-07 DIAGNOSIS — M25511 Pain in right shoulder: Secondary | ICD-10-CM | POA: Diagnosis not present

## 2022-08-08 ENCOUNTER — Encounter: Payer: Self-pay | Admitting: Family Medicine

## 2022-08-08 NOTE — Progress Notes (Signed)
.sm[facility]  Office: [phone]  /  Fax: [phone]  WEIGHT SUMMARY AND BIOMETRICS  Anthropometric Measurements Height: 5' (1.524 m) Weight: 157 lb (71.2 kg) BMI (Calculated): 30.66 Weight at Last Visit: 156 lb Weight Lost Since Last Visit: 0 Weight Gained Since Last Visit: 1 lb   Body Composition  Body Fat %: 43.9 % Fat Mass (lbs): 69 lbs Muscle Mass (lbs): 83.6 lbs Total Body Water (lbs): 58 lbs Visceral Fat Rating : 13   Other Clinical Data Fasting: No Labs: No Today's Visit #: 65    Chief Complaint: OBESITY   Discussed the use of AI scribe software for clinical note transcription with the patient, who gave verbal consent to proceed.  History of Present Illness   The patient is a 72 year old female who presents primarily for a discussion regarding her obesity. She reports adherence to a vegetarian or category one eating plan about 85% of the time, but admits to a lack of exercise. Over the past month, she has gained one pound.  She has been active in her yard, but reports persistent back pain for the past 4-5 weeks, which worsens with activity. She also fell backwards off a garden stool while deadheading peonies, landing on her shoulder. She sought medical attention for this and was diagnosed with bursitis. She was given a band and exercises to help manage this condition.  The patient also reports lower abdominal pain, which she initially thought was food-related. However, after eliminating metformin from her medication regimen, the pain subsided. She had been taking metformin for prediabetes, but it appears she may be sensitive to it.  She has been following a mixed diet, incorporating elements from both the category one and vegetarian plans. She reports good adherence to vegetable consumption, with a vegetable dish included in her dinner most nights. She has also reduced her Diet Coke consumption and increased her water intake, sometimes adding lemon or mint for  flavor.  The patient's blood pressure at this visit was 139/74, which she acknowledges is borderline. She occasionally checks her blood pressure at home, particularly if she experiences a headache. Her sleep has improved, and she has not needed to take her CBD gummies for about two weeks. She occasionally drinks sleepy time tea if she has trouble sleeping.  Overall, the patient is actively managing her health, but struggles with obesity, back pain, and potential sensitivity to metformin. She is proactive in her dietary choices and is making efforts to increase her physical activity.          PHYSICAL EXAM:  Blood pressure 139/74, pulse (!) 57, temperature 98 F (36.7 C), height 5' (1.524 m), weight 157 lb (71.2 kg), SpO2 98 %. Body mass index is 30.66 kg/m.  DIAGNOSTIC DATA REVIEWED:  BMET    Component Value Date/Time   NA 137 07/25/2022 1442   NA 141 02/26/2022 1046   K 4.4 07/25/2022 1442   CL 104 07/25/2022 1442   CO2 26 07/25/2022 1442   GLUCOSE 92 07/25/2022 1442   BUN 22 07/25/2022 1442   BUN 17 02/26/2022 1046   CREATININE 0.86 07/25/2022 1442   CALCIUM 9.3 07/25/2022 1442   GFRNONAA 55 (L) 03/30/2020 1030   GFRAA 63 03/30/2020 1030   Lab Results  Component Value Date   HGBA1C 5.8 (H) 02/26/2022   HGBA1C 5.6 06/16/2017   Lab Results  Component Value Date   INSULIN 10.2 02/26/2022   INSULIN 6.4 06/16/2017   Lab Results  Component Value Date   TSH 1.020  02/26/2022   CBC    Component Value Date/Time   WBC 8.0 07/25/2022 1442   RBC 3.97 07/25/2022 1442   HGB 12.4 07/25/2022 1442   HGB 13.3 08/04/2019 1057   HCT 37.1 07/25/2022 1442   HCT 39.7 08/04/2019 1057   PLT 229.0 07/25/2022 1442   PLT 223 08/04/2019 1057   MCV 93.4 07/25/2022 1442   MCV 95 08/04/2019 1057   MCH 31.7 08/04/2019 1057   MCH 31.8 10/14/2017 1923   MCHC 33.4 07/25/2022 1442   RDW 13.3 07/25/2022 1442   RDW 12.1 08/04/2019 1057   Iron Studies    Component Value Date/Time    IRON 117 10/17/2020 1013   IRON 100 01/25/2019 0853   TIBC 431.2 10/17/2020 1013   TIBC 328 01/25/2019 0853   FERRITIN 133.2 10/17/2020 1013   FERRITIN 54 01/25/2019 0853   IRONPCTSAT 27.1 10/17/2020 1013   IRONPCTSAT 30 01/25/2019 0853   Lipid Panel     Component Value Date/Time   CHOL 127 02/26/2022 1046   TRIG 103 02/26/2022 1046   HDL 73 02/26/2022 1046   CHOLHDL 1.6 05/15/2021 1000   CHOLHDL 3 11/30/2015 1028   VLDL 25.4 11/30/2015 1028   LDLCALC 35 02/26/2022 1046   Hepatic Function Panel     Component Value Date/Time   PROT 7.0 07/25/2022 1442   PROT 6.5 02/26/2022 1046   ALBUMIN 4.4 07/25/2022 1442   ALBUMIN 4.4 02/26/2022 1046   AST 18 07/25/2022 1442   ALT 12 07/25/2022 1442   ALKPHOS 53 07/25/2022 1442   BILITOT 0.4 07/25/2022 1442   BILITOT 0.3 02/26/2022 1046   BILIDIR 0.13 05/15/2021 1000      Component Value Date/Time   TSH 1.020 02/26/2022 1046   Nutritional Lab Results  Component Value Date   VD25OH 83.1 02/26/2022   VD25OH 63.8 10/18/2021   VD25OH 63.1 07/11/2021     Assessment and Plan    Obesity: Gained one pound in the last month. Following a vegetarian eating plan or a category one eating plan 85% of the time. No current exercise regimen. -Continue with meal planning and prepping. -Encourage to increase physical activity.  Prediabetes: Discontinued Metformin due to gastrointestinal side effects. -Hold off on Metformin. -Continue with dietary modifications and monitor blood glucose levels.  Hypertension: Blood pressure slightly elevated at 139/74. -Continue current antihypertensive regimen. -Encourage patient to monitor blood pressure at home and report any consistent elevations.  Follow-up in 6 weeks. If any issues arise in the meantime, patient to contact via MyChart.      She was informed of the importance of frequent follow up visits to maximize her success with intensive lifestyle modifications for her multiple health  conditions.    Quillian Quince, MD

## 2022-08-26 ENCOUNTER — Ambulatory Visit
Admission: RE | Admit: 2022-08-26 | Discharge: 2022-08-26 | Disposition: A | Discharge status: Home / Self Care | Payer: Medicare HMO | Source: Ambulatory Visit | Attending: Emergency Medicine | Admitting: Emergency Medicine

## 2022-08-26 DIAGNOSIS — R1030 Lower abdominal pain, unspecified: Secondary | ICD-10-CM

## 2022-08-26 DIAGNOSIS — I700 Atherosclerosis of aorta: Secondary | ICD-10-CM | POA: Diagnosis not present

## 2022-08-26 DIAGNOSIS — R1032 Left lower quadrant pain: Secondary | ICD-10-CM | POA: Diagnosis not present

## 2022-08-26 MED ORDER — IOPAMIDOL (ISOVUE-300) INJECTION 61%
100.0000 mL | Freq: Once | INTRAVENOUS | Status: AC | PRN
  Administered 2022-08-26: 100 mL via INTRAVENOUS

## 2022-08-28 ENCOUNTER — Ambulatory Visit
Admission: RE | Admit: 2022-08-28 | Discharge: 2022-08-28 | Disposition: A | Discharge status: Home / Self Care | Payer: Medicare HMO | Source: Ambulatory Visit | Attending: Obstetrics and Gynecology | Admitting: Obstetrics and Gynecology

## 2022-08-28 DIAGNOSIS — M8580 Other specified disorders of bone density and structure, unspecified site: Secondary | ICD-10-CM

## 2022-08-29 DIAGNOSIS — M25511 Pain in right shoulder: Secondary | ICD-10-CM | POA: Diagnosis not present

## 2022-09-09 ENCOUNTER — Ambulatory Visit
Admission: RE | Admit: 2022-09-09 | Discharge: 2022-09-09 | Disposition: A | Discharge status: Home / Self Care | Payer: Medicare HMO | Source: Ambulatory Visit | Attending: Obstetrics and Gynecology | Admitting: Obstetrics and Gynecology

## 2022-09-09 DIAGNOSIS — E349 Endocrine disorder, unspecified: Secondary | ICD-10-CM | POA: Diagnosis not present

## 2022-09-09 DIAGNOSIS — M8588 Other specified disorders of bone density and structure, other site: Secondary | ICD-10-CM | POA: Diagnosis not present

## 2022-09-09 DIAGNOSIS — N958 Other specified menopausal and perimenopausal disorders: Secondary | ICD-10-CM | POA: Diagnosis not present

## 2022-09-17 ENCOUNTER — Ambulatory Visit (INDEPENDENT_AMBULATORY_CARE_PROVIDER_SITE_OTHER): Payer: Medicare HMO | Admitting: Family Medicine

## 2022-09-17 ENCOUNTER — Encounter (INDEPENDENT_AMBULATORY_CARE_PROVIDER_SITE_OTHER): Payer: Self-pay | Admitting: Family Medicine

## 2022-09-17 VITALS — BP 113/67 | HR 44 | Temp 97.8°F | Ht 60.0 in | Wt 159.0 lb

## 2022-09-17 DIAGNOSIS — E782 Mixed hyperlipidemia: Secondary | ICD-10-CM

## 2022-09-17 DIAGNOSIS — Z6831 Body mass index (BMI) 31.0-31.9, adult: Secondary | ICD-10-CM

## 2022-09-17 DIAGNOSIS — R7303 Prediabetes: Secondary | ICD-10-CM

## 2022-09-17 DIAGNOSIS — M8580 Other specified disorders of bone density and structure, unspecified site: Secondary | ICD-10-CM | POA: Diagnosis not present

## 2022-09-17 DIAGNOSIS — E669 Obesity, unspecified: Secondary | ICD-10-CM | POA: Diagnosis not present

## 2022-09-17 NOTE — Progress Notes (Signed)
Chief Complaint:   OBESITY Courtney [name] is here to discuss her progress with her obesity treatment plan along with follow-up of her obesity related diagnoses. Courtney [name] is on the Vegetarian Plan and states she is following her eating plan approximately 85% of the time. Courtney [name] states she is active while gardening.    Today's visit was #: 65 Starting weight: 157 lbs Starting date: 06/16/2017 Today's weight: 159 lbs Today's date: 09/17/2022 Total lbs lost to date: 0 Total lbs lost since last in-office visit: 0  Interim History: Patient continues to be mindful of her eating.  She notes especially struggling with meal planning and eating out.  She is working on getting back on track.  Subjective:   1. Pre-diabetes Patient continues to work on decreasing simple carbohydrates.  She is doing better overall.  2. Mixed hyperlipidemia Patient is on Crestor, and she is working on her diet.  She denies chest pain.  3. Osteopenia, unspecified location Patient's recent bone density results showed osteopenia.  She has questions about how much calcium to take.  Assessment/Plan:   1. Pre-diabetes Patient will continue with her diet, and we will recheck labs in 2 to 3 months.  2. Mixed hyperlipidemia Patient will continue with her diet and weight loss, and we will recheck labs in 2 to 3 months.  3. Osteopenia, unspecified location Patient is already on a calcium rich diet.  She was encouraged to get 15 mg of calcium per day and to supplement what she still needs.  She will continue with her vitamin D and strengthening exercise.  4. BMI 31.0-31.9,adult  5. Obesity, Beginning BMI 30.66 Courtney [name] is currently in the action stage of change. As such, her goal is to continue with weight loss efforts. She has agreed to the Category 1 Plan or the Vegetarian Plan.   Behavioral modification strategies: increasing lean protein intake.  Courtney [name] has agreed to follow-up with our clinic in 4 weeks. She was informed of  the importance of frequent follow-up visits to maximize her success with intensive lifestyle modifications for her multiple health conditions.   Objective:   Blood pressure 113/67, pulse (!) 44, temperature 97.8 F (36.6 C), height 5' (1.524 m), weight 159 lb (72.1 kg), SpO2 96%. Body mass index is 31.05 kg/m.  Lab Results  Component Value Date   CREATININE 0.86 07/25/2022   BUN 22 07/25/2022   NA 137 07/25/2022   K 4.4 07/25/2022   CL 104 07/25/2022   CO2 26 07/25/2022   Lab Results  Component Value Date   ALT 12 07/25/2022   AST 18 07/25/2022   ALKPHOS 53 07/25/2022   BILITOT 0.4 07/25/2022   Lab Results  Component Value Date   HGBA1C 5.8 (H) 02/26/2022   HGBA1C 5.8 (H) 10/18/2021   HGBA1C 5.7 (H) 07/11/2021   HGBA1C 6.0 (H) 03/20/2021   HGBA1C 5.8 (H) 08/30/2020   Lab Results  Component Value Date   INSULIN 10.2 02/26/2022   INSULIN 6.7 10/18/2021   INSULIN 9.0 07/11/2021   INSULIN 8.2 03/20/2021   INSULIN 6.8 08/30/2020   Lab Results  Component Value Date   TSH 1.020 02/26/2022   Lab Results  Component Value Date   CHOL 127 02/26/2022   HDL 73 02/26/2022   LDLCALC 35 02/26/2022   TRIG 103 02/26/2022   CHOLHDL 1.6 05/15/2021   Lab Results  Component Value Date   VD25OH 83.1 02/26/2022   VD25OH 63.8 10/18/2021   VD25OH 63.1 07/11/2021   Lab Results  Component Value Date   WBC 8.0 07/25/2022   HGB 12.4 07/25/2022   HCT 37.1 07/25/2022   MCV 93.4 07/25/2022   PLT 229.0 07/25/2022   Lab Results  Component Value Date   IRON 117 10/17/2020   TIBC 431.2 10/17/2020   FERRITIN 133.2 10/17/2020   Attestation Statements:   Reviewed by clinician on day of visit: allergies, medications, problem list, medical history, surgical history, family history, social history, and previous encounter notes.   I, Burt Knack, am acting as transcriptionist for Quillian Quince, MD.  I have reviewed the above documentation for accuracy and completeness, and I  agree with the above. -  Quillian Quince, MD

## 2022-09-18 ENCOUNTER — Encounter (INDEPENDENT_AMBULATORY_CARE_PROVIDER_SITE_OTHER): Payer: Self-pay

## 2022-10-15 ENCOUNTER — Ambulatory Visit (INDEPENDENT_AMBULATORY_CARE_PROVIDER_SITE_OTHER): Payer: Medicare HMO | Admitting: Family Medicine

## 2022-10-15 ENCOUNTER — Encounter (INDEPENDENT_AMBULATORY_CARE_PROVIDER_SITE_OTHER): Payer: Self-pay | Admitting: Family Medicine

## 2022-10-15 VITALS — BP 126/63 | HR 55 | Temp 97.8°F | Ht 60.0 in | Wt 157.0 lb

## 2022-10-15 DIAGNOSIS — E559 Vitamin D deficiency, unspecified: Secondary | ICD-10-CM

## 2022-10-15 DIAGNOSIS — E669 Obesity, unspecified: Secondary | ICD-10-CM

## 2022-10-15 DIAGNOSIS — R1013 Epigastric pain: Secondary | ICD-10-CM | POA: Insufficient documentation

## 2022-10-15 DIAGNOSIS — Z6830 Body mass index (BMI) 30.0-30.9, adult: Secondary | ICD-10-CM

## 2022-10-16 ENCOUNTER — Encounter (INDEPENDENT_AMBULATORY_CARE_PROVIDER_SITE_OTHER): Payer: Self-pay | Admitting: Family Medicine

## 2022-10-16 ENCOUNTER — Ambulatory Visit (INDEPENDENT_AMBULATORY_CARE_PROVIDER_SITE_OTHER): Payer: Medicare HMO | Admitting: Emergency Medicine

## 2022-10-16 ENCOUNTER — Encounter: Payer: Self-pay | Admitting: Emergency Medicine

## 2022-10-16 VITALS — BP 124/66 | HR 50 | Temp 97.8°F | Ht 60.0 in | Wt 161.0 lb

## 2022-10-16 DIAGNOSIS — R7303 Prediabetes: Secondary | ICD-10-CM | POA: Diagnosis not present

## 2022-10-16 DIAGNOSIS — I10 Essential (primary) hypertension: Secondary | ICD-10-CM | POA: Diagnosis not present

## 2022-10-16 DIAGNOSIS — I2510 Atherosclerotic heart disease of native coronary artery without angina pectoris: Secondary | ICD-10-CM

## 2022-10-16 DIAGNOSIS — F4323 Adjustment disorder with mixed anxiety and depressed mood: Secondary | ICD-10-CM | POA: Diagnosis not present

## 2022-10-16 DIAGNOSIS — E785 Hyperlipidemia, unspecified: Secondary | ICD-10-CM

## 2022-10-16 LAB — VITAMIN D 25 HYDROXY (VIT D DEFICIENCY, FRACTURES): Vit D, 25-Hydroxy: 90.1 ng/mL (ref 30.0–100.0)

## 2022-10-16 MED ORDER — SERTRALINE HCL 25 MG PO TABS
25.0000 mg | ORAL_TABLET | Freq: Every day | ORAL | 3 refills | Status: DC
Start: 2022-10-16 — End: 2022-11-09

## 2022-10-16 NOTE — Assessment & Plan Note (Signed)
BP Readings from Last 3 Encounters:  10/16/22 124/66  10/15/22 126/63  09/17/22 113/67  Well-controlled hypertension Continue metoprolol titrate 12.5 mg twice a day, ramipril 10 mg daily Cardiovascular risks associated with hypertension discussed Dietary approaches to stop hypertension discussed

## 2022-10-16 NOTE — Assessment & Plan Note (Signed)
Chronic stable condition. Continue Zetia 10 mg daily and rosuvastatin 40 mg daily

## 2022-10-16 NOTE — Assessment & Plan Note (Signed)
Stable.  Diet and nutrition discussed. 

## 2022-10-16 NOTE — Progress Notes (Signed)
Chief Complaint:   OBESITY Courtney [name] is here to discuss her progress with her obesity treatment plan along with follow-up of her obesity related diagnoses. Courtney [name] is on the Category 1 Plan or the Vegetarian Plan and states she is following her eating plan approximately 85% of the time. Courtney [name] states she is active while gardening.    Today's visit was #: 66 Starting weight: 157 lbs Starting date: 06/16/2017 Today's weight: 157 lbs Today's date: 10/15/2022 Total lbs lost to date: 0 Total lbs lost since last in-office visit: 2  Interim History: Patient has done well with her weight loss.  She is working on increasing her fiber and water intake.  She is working on trying to increase walking, but she has been doing dry needling to help with her back pain.   Subjective:   1. Dyspepsia Patient has been trying to increase her fiber in her diet, and she takes probiotics.  2. Vitamin D deficiency Patient is on vitamin D 50,000 IU OTC, and she is due for labs.  Assessment/Plan:   1. Dyspepsia Benefits of probiotics and prebiotic's were discussed with the patient.  Patient is to slowly increase her fiber intake with a goal of 20 grams per day.  2. Vitamin D deficiency We will check labs today, and we will follow-up at patient's next visit.  - VITAMIN D 25 Hydroxy (Vit-D Deficiency, Fractures)  3. BMI 30.0-30.9,adult  4. Obesity, Beginning BMI 30.66 Courtney [name] is currently in the action stage of change. As such, her goal is to continue with weight loss efforts. She has agreed to the Category 1 Plan.   Exercise goals: All adults should avoid inactivity. Some physical activity is better than none, and adults who participate in any amount of physical activity gain some health benefits.  Behavioral modification strategies: increasing lean protein intake and increasing high fiber foods.  Courtney [name] has agreed to follow-up with our clinic in 4 weeks. She was informed of the importance of frequent  follow-up visits to maximize her success with intensive lifestyle modifications for her multiple health conditions.   Objective:   Blood pressure 126/63, pulse (!) 55, temperature 97.8 F (36.6 C), height 5' (1.524 m), weight 157 lb (71.2 kg), SpO2 96%. Body mass index is 30.66 kg/m.  Lab Results  Component Value Date   CREATININE 0.86 07/25/2022   BUN 22 07/25/2022   NA 137 07/25/2022   K 4.4 07/25/2022   CL 104 07/25/2022   CO2 26 07/25/2022   Lab Results  Component Value Date   ALT 12 07/25/2022   AST 18 07/25/2022   ALKPHOS 53 07/25/2022   BILITOT 0.4 07/25/2022   Lab Results  Component Value Date   HGBA1C 5.8 (H) 02/26/2022   HGBA1C 5.8 (H) 10/18/2021   HGBA1C 5.7 (H) 07/11/2021   HGBA1C 6.0 (H) 03/20/2021   HGBA1C 5.8 (H) 08/30/2020   Lab Results  Component Value Date   INSULIN 10.2 02/26/2022   INSULIN 6.7 10/18/2021   INSULIN 9.0 07/11/2021   INSULIN 8.2 03/20/2021   INSULIN 6.8 08/30/2020   Lab Results  Component Value Date   TSH 1.020 02/26/2022   Lab Results  Component Value Date   CHOL 127 02/26/2022   HDL 73 02/26/2022   LDLCALC 35 02/26/2022   TRIG 103 02/26/2022   CHOLHDL 1.6 05/15/2021   Lab Results  Component Value Date   VD25OH 90.1 10/15/2022   VD25OH 83.1 02/26/2022   VD25OH 63.8 10/18/2021   Lab Results  Component Value Date   WBC 8.0 07/25/2022   HGB 12.4 07/25/2022   HCT 37.1 07/25/2022   MCV 93.4 07/25/2022   PLT 229.0 07/25/2022   Lab Results  Component Value Date   IRON 117 10/17/2020   TIBC 431.2 10/17/2020   FERRITIN 133.2 10/17/2020   Attestation Statements:   Reviewed by clinician on day of visit: allergies, medications, problem list, medical history, surgical history, family history, social history, and previous encounter notes.   I, Burt Knack, am acting as transcriptionist for Quillian Quince, MD.  I have reviewed the above documentation for accuracy and completeness, and I agree with the above. -  Quillian Quince, MD

## 2022-10-16 NOTE — Assessment & Plan Note (Signed)
Stable.  No recent anginal episodes No recent use of nitroglycerin Continue metoprolol tartrate 12.5 mg twice daily along with a daily baby aspirin

## 2022-10-16 NOTE — Progress Notes (Signed)
Courtney [name] 72 y.o.   Chief Complaint  Patient presents with   Medical Management of Chronic Issues    f/u appt, patient states she is having some anxiety and depression , patient states the issue is ongoing.     HISTORY OF PRESENT ILLNESS: This is a 72 y.o. female here for 80-month follow-up of chronic medical problems Presently under a lot of stress.  Problems with her daughter who is struggling with anxiety and depression. Patient herself now feeling anxious and depressed.  Known trigger. Has appointment with therapist on September 5 next week. No other complaints or medical concerns today.  HPI   Prior to Admission medications   Medication Sig Start Date End Date Taking? Authorizing Provider  aspirin 81 MG tablet Take 81 mg by mouth daily.   Yes [provider]  clobetasol cream (TEMOVATE) 0.05 % Apply 1-2 times a day to poison oak for 2 weeks 07/02/21  Yes Janalyn Harder, MD  Evolocumab (REPATHA SURECLICK) 140 MG/ML SOAJ Inject 140 mg into the skin every 14 (fourteen) days. 03/29/22  Yes [place], Peter M, MD  ezetimibe (ZETIA) 10 MG tablet Take 1 tablet (10 mg total) by mouth daily. 03/29/22 03/24/23 Yes [place], Peter M, MD  gabapentin (NEURONTIN) 300 MG capsule Take 1 capsule (300 mg total) by mouth at bedtime. 04/17/22  Yes Darlyn Repsher, Eilleen Kempf, MD  metoprolol tartrate (LOPRESSOR) 25 MG tablet Take 0.5 tablets (12.5 mg total) by mouth 2 (two) times daily. 03/29/22  Yes [place], Peter M, MD  nitroGLYCERIN (NITROSTAT) 0.4 MG SL tablet Place 1 tablet (0.4 mg total) under the tongue every 5 (five) minutes as needed for chest pain. 10/07/18  Yes [place], Peter M, MD  pantoprazole (PROTONIX) 40 MG tablet Take 1 tablet (40 mg total) by mouth daily. 04/30/22  Yes Carvell Hoeffner, Eilleen Kempf, MD  ramipril (ALTACE) 10 MG capsule Take 1 capsule (10 mg total) by mouth daily. 03/29/22  Yes [place], Peter M, MD  rosuvastatin (CRESTOR) 40 MG tablet Take 1 tablet (40 mg total) by mouth daily. 03/29/22   Yes [place], Peter M, MD  sertraline (ZOLOFT) 25 MG tablet Take 1 tablet (25 mg total) by mouth daily. 10/16/22  Yes Vito Beg, Eilleen Kempf, MD  Vitamin D, Ergocalciferol, (DRISDOL) 1.25 MG (50000 UNIT) CAPS capsule Take 1 capsule (50,000 Units total) by mouth every 7 (seven) days. 07/10/22  Yes Beasley, Caren D, MD  zolpidem (AMBIEN) 5 MG tablet Take 1 tablet (5 mg total) by mouth at bedtime as needed for sleep. 04/17/22  Yes Charlye Spare, Eilleen Kempf, MD    Allergies  Allergen Reactions   Erythromycin Hives   Penicillins Hives    Has patient had a PCN reaction causing immediate rash, facial/tongue/throat swelling, SOB or lightheadedness with hypotension: No Has patient had a PCN reaction causing severe rash involving mucus membranes or skin necrosis: No Has patient had a PCN reaction that required hospitalization: No Has patient had a PCN reaction occurring within the last 10 years: No If all of the above answers are "NO", then may proceed with Cephalosporin use.    Shellfish Allergy Hives   Doxycycline Rash    Patient Active Problem List   Diagnosis Date Noted   Mixed hyperlipidemia 09/17/2022   BMI 30.0-30.9,adult 03/26/2022   Obesity, Beginning BMI 30.66 03/26/2022   Insulin resistance 02/26/2022   Chronic pain of right knee 10/15/2021   Stress 08/30/2021   Pre-diabetes 07/11/2021   Degenerative disc disease, lumbar 05/19/2020   Restless leg syndrome  05/15/2020   Degenerative disc disease, cervical 02/02/2020   Polyarthralgia 01/03/2020   Patellofemoral arthritis of right knee 06/16/2019   Vitamin D deficiency, Vit D = 43.2 (01/25/19), Rx vit D 50K IU every 2 weeks 08/04/2018   Cyst of right breast 05/07/2018   Chronic headache disorder 02/08/2018   Essential hypertension 11/17/2017   Class 1 obesity with serious comorbidity and body mass index (BMI) of 30.0 to 30.9 in adult 07/15/2017   Insomnia 07/15/2017   Female stress incontinence 07/15/2017   Gastroesophageal reflux disease  with esophagitis, Rx Protonix 02/25/2017   Osteopenia 06/27/2015   Osteoarthritis of right hip 07/08/2014   Carotid artery disease (HCC)    Aortic valve sclerosis    Dyslipidemia    Coronary artery disease     Past Medical History:  Diagnosis Date   Aortic valve sclerosis    echo 12/2008, soft systolic murmur   Arthritis    in right hip   Back pain    Basal cell carcinoma 05/31/2016   bcc left forehead  tx exc   Carotid artery disease (HCC)    40-59% bilateral, doppler December 2010   Cataract    Constipation    Coronary artery disease    a. BMS-mid RCA 11/2007 b. stress echo 02/2009 subtle inferior HK, lateral ST depressions with stress c. follow-up cath 02/2009: RCA stent patent, severe but stable stenosis of distal posterior lateral branch RCA. LV normal, med tx unless more sx c. ETT Myoview 08/27/12: negative for ischemia; EF 64%   Decreased hearing    Depression    Mild, August, 2012   Dyslipidemia    Easy bruising    Ejection fraction    EF normal, catheterization, 2011 /  EF 55%, echo, 2010   Fluid overload    Mild, stable since hospitalization in the past   GERD (gastroesophageal reflux disease)    occarionally, takes Pepcid over the counter   Heart murmur    has, occasional PVC's   History of right hip replacement    Hyperlipidemia    Hypertension    Joint pain    Myocardial infarction (HCC)    2009   Palpitations    PVC (premature ventricular contraction)    per prior Holter monitor   Right hip pain 12/2009   Shellfish allergy    Stress fracture    Left foot   Trouble in sleeping    Vitamin B12 deficiency    Vitamin D deficiency     Past Surgical History:  Procedure Laterality Date   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/01/2005   BREAST BIOPSY Left    carcinoid tumor removal     colon-   CARDIAC CATHETERIZATION  1/211   Patent RCA stent, stenotic PLB supplying small distribution; medically managed   CATARACT EXTRACTION, BILATERAL     CORONARY ANGIOPLASTY WITH  STENT PLACEMENT  11/2007   BMS-mid RCA   DIAGNOSTIC LAPAROSCOPY     1982 for endometriosis   DILATION AND CURETTAGE OF UTERUS  2005   SKIN CANCER EXCISION     TOTAL HIP ARTHROPLASTY Right 07/08/2014   Procedure: RIGHT TOTAL HIP ARTHROPLASTY ANTERIOR APPROACH;  Surgeon: Kathryne Hitch, MD;  Location: [facility];  Service: Orthopedics;  Laterality: Right;   WISDOM TOOTH EXTRACTION      Social History   Socioeconomic History   Marital status: Divorced    Spouse name: Not on file   Number of children: 1   Years of education: 18   Highest education  level: Master's degree (e.g., MA, MS, MEng, MEd, MSW, MBA)  Occupational History   Occupation: Retired - Publishing copy  Tobacco Use   Smoking status: Never   Smokeless tobacco: Never  Vaping Use   Vaping status: Never Used  Substance and Sexual Activity   Alcohol use: Yes    Comment: 1 glass of wine 3x/week   Drug use: No   Sexual activity: Yes  Other Topics Concern   Not on file  Social History Narrative   Right handed   Soda- sometimes   Drinks herbal tea   Fun: Dance, garden,    Denies religious beliefs effecting health care.   Denies abuse and feels safe at home   Social Determinants of Health   Financial Resource Strain: Low Risk  (09/24/2021)   Overall Financial Resource Strain (CARDIA)    Difficulty of Paying Living Expenses: Not hard at all  Food Insecurity: No Food Insecurity (07/23/2022)   Hunger Vital Sign    Worried About Running Out of Food in the Last Year: Never true    Ran Out of Food in the Last Year: Never true  Transportation Needs: No Transportation Needs (07/23/2022)   PRAPARE - Administrator, Civil Service (Medical): No    Lack of Transportation (Non-Medical): No  Physical Activity: Insufficiently Active (07/23/2022)   Exercise Vital Sign    Days of Exercise per Week: 2 days    Minutes of Exercise per Session: 30 min  Stress: No Stress Concern Present (07/23/2022)   [employer] of  Occupational Health - Occupational Stress Questionnaire    Feeling of Stress : Only a little  Social Connections: Socially Isolated (07/23/2022)   Social Connection and Isolation Panel [NHANES]    Frequency of Communication with Friends and Family: More than three times a week    Frequency of Social Gatherings with Friends and Family: Not on file    Attends Religious Services: Never    Database administrator or Organizations: No    Attends Engineer, structural: Not on file    Marital Status: Divorced  Intimate Partner Violence: Not At Risk (09/24/2021)   Humiliation, Afraid, Rape, and Kick questionnaire    Fear of Current or Ex-Partner: No    Emotionally Abused: No    Physically Abused: No    Sexually Abused: No    Family History  Problem Relation Age of Onset   Heart attack Father 53   Hyperlipidemia Father    Hypertension Father    Sudden death Father    Stroke Mother    Hyperlipidemia Mother    Hypertension Mother    Heart disease Mother    Obesity Mother    Heart attack Paternal Grandfather    Heart disease Sister    BRCA 1/2 Neg Hx    Breast cancer Neg Hx    Esophageal cancer Neg Hx    Colon cancer Neg Hx    Pancreatic cancer Neg Hx    Liver disease Neg Hx    Stomach cancer Neg Hx      Review of Systems  Constitutional: Negative.  Negative for chills and fever.  HENT: Negative.  Negative for congestion and sore throat.   Respiratory: Negative.  Negative for cough and shortness of breath.   Cardiovascular: Negative.  Negative for chest pain and palpitations.  Gastrointestinal:  Negative for abdominal pain, diarrhea, nausea and vomiting.  Genitourinary: Negative.  Negative for dysuria and hematuria.  Skin: Negative.  Negative for rash.  Neurological: Negative.  Negative for dizziness and headaches.  Psychiatric/Behavioral:  Positive for depression. The patient is nervous/anxious.   All other systems reviewed and are negative.   Vitals:   10/16/22 1042   BP: 124/66  Pulse: (!) 50  Temp: 97.8 F (36.6 C)  SpO2: 96%    Physical Exam Vitals reviewed.  Constitutional:      Appearance: Normal appearance.  Eyes:     Extraocular Movements: Extraocular movements intact.  Cardiovascular:     Rate and Rhythm: Normal rate and regular rhythm.     Pulses: Normal pulses.     Heart sounds: Normal heart sounds.  Pulmonary:     Effort: Pulmonary effort is normal.     Breath sounds: Normal breath sounds.  Skin:    General: Skin is warm and dry.  Neurological:     General: No focal deficit present.     Mental Status: She is alert and oriented to person, place, and time.  Psychiatric:        Mood and Affect: Mood normal.        Behavior: Behavior normal.      ASSESSMENT & PLAN: A total of 45 minutes was spent with the patient and counseling/coordination of care regarding preparing for this visit, review of most recent office visit notes, review of multiple chronic medical conditions under management, review of all medications, review of most recent blood work results, stress management, prognosis, documentation, need for follow-up.  Problem List Items Addressed This Visit       Cardiovascular and Mediastinum   Coronary artery disease    Stable.  No recent anginal episodes No recent use of nitroglycerin Continue metoprolol tartrate 12.5 mg twice a day on daily baby aspirin      Essential hypertension    BP Readings from Last 3 Encounters:  10/16/22 124/66  10/15/22 126/63  09/17/22 113/67  Well-controlled hypertension Continue metoprolol titrate 12.5 mg twice a day, ramipril 10 mg daily Cardiovascular risks associated with hypertension discussed Dietary approaches to stop hypertension discussed         Other   Dyslipidemia    Chronic stable condition Continue Zetia 10 mg daily and rosuvastatin 40 mg daily      Pre-diabetes    Stable.  Diet and nutrition discussed.      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 Known triggers.  Scheduled to see therapist next week Stress management discussed Recommend to start Zoloft 25 mg daily      Relevant Medications   sertraline (ZOLOFT) 25 MG tablet   Patient Instructions  Stress, Adult Stress is a normal reaction to life events. Stress is what you feel when life demands more than you are used to, or more than you think you can handle. Some stress can be useful, such as studying for a test or meeting a deadline at work. Stress that occurs too often or for too long can cause problems. Long-lasting stress is called chronic stress. Chronic stress can affect your emotional health and interfere with relationships and normal daily activities. Too much stress can weaken your body's defense system (immune system) and increase your risk for physical illness. If you already have a medical problem, stress can make it worse. What are the causes? All sorts of life events can cause stress. An event that causes stress for one person may not be stressful for someone else. Major life events, whether positive or negative,  commonly cause stress. Examples include: Losing a job or starting a new job. Losing a loved one. Moving to a new town or home. Getting married or divorced. Having a baby. Getting injured or sick. Less obvious life events can also cause stress, especially if they occur day after day or in combination with each other. Examples include: Working long hours. Driving in traffic. Caring for children. Being in debt. Being in a difficult relationship. What are the signs or symptoms? Stress can cause emotional and physical symptoms and can lead to unhealthy behaviors. These include the following: Emotional symptoms Anxiety. This is feeling worried, afraid, on edge, overwhelmed, or out of control. Anger, including irritation or impatience. Depression. This is feeling sad, down, helpless, or guilty. Trouble  focusing, remembering, or making decisions. Physical symptoms Aches and pains. These may affect your head, neck, back, stomach, or other areas of your body. Tight muscles or a clenched jaw. Low energy. Trouble sleeping. Unhealthy behaviors Eating to feel better (overeating) or skipping meals. Working too much or putting off tasks. Smoking, drinking alcohol, or using drugs to feel better. How is this diagnosed? A stress disorder is diagnosed through an assessment by your health care provider. A stress disorder may be diagnosed based on: Your symptoms and any stressful life events. Your medical history. Tests to rule out other causes of your symptoms. Depending on your condition, your health care provider may refer you to a specialist for further evaluation. How is this treated?  Stress management techniques are the recommended treatment for stress. Medicine is not typically recommended for treating stress. Techniques to reduce your reaction to stressful life events include: Identifying stress. Monitor yourself for symptoms of stress and notice what causes stress for you. These skills may help you to avoid or prepare for stressful events. Managing time. Set your priorities, keep a calendar of events, and learn to say no. These actions can help you avoid taking on too much. Techniques for dealing with stress include: Rethinking the problem. Try to think realistically about stressful events rather than ignoring them or overreacting. Try to find the positives in a stressful situation rather than focusing on the negatives. Exercise. Physical exercise can release both physical and emotional tension. The key is to find a form of exercise that you enjoy and do it regularly. Relaxation techniques. These relax the body and mind. Find one or more that you enjoy and use the techniques regularly. Examples include: Meditation, deep breathing, or progressive relaxation techniques. Yoga or tai  chi. Biofeedback, mindfulness techniques, or journaling. Listening to music, being in nature, or taking part in other hobbies. Practicing a healthy lifestyle. Eat a balanced diet, drink plenty of water, limit or avoid caffeine, and get plenty of sleep. Having a strong support network. Spend time with family, friends, or other people you enjoy being around. Express your feelings and talk things over with someone you trust. Counseling or talk therapy with a mental health provider may help if you are having trouble managing stress by yourself. Follow these instructions at home: Lifestyle  Avoid drugs. Do not use any products that contain nicotine or tobacco. These products include cigarettes, chewing tobacco, and vaping devices, such as e-cigarettes. If you need help quitting, ask your health care provider. If you drink alcohol: Limit how much you have to: 0-1 drink a day for women who are not pregnant. 0-2 drinks a day for men. Know how much alcohol is in a drink. In the [place] one drink equals one 12  oz bottle of beer (355 mL), one 5 oz glass of wine (148 mL), or one 1 oz glass of hard liquor (44 mL). Do not use alcohol or drugs to relax. Eat a balanced diet that includes fresh fruits and vegetables, whole grains, lean meats, fish, eggs, beans, and low-fat dairy. Avoid processed foods and foods high in added fat, sugar, and salt. Exercise at least 30 minutes on 5 or more days each week. Get 7-8 hours of sleep each night. General instructions  Practice stress management techniques as told by your health care provider. Drink enough fluid to keep your urine pale yellow. Take over-the-counter and prescription medicines only as told by your health care provider. Keep all follow-up visits. This is important. Contact a health care provider if: Your symptoms get worse. You have new symptoms. You feel overwhelmed by your problems and can no longer manage them by yourself. Get help right away  if: You have thoughts of hurting yourself or others. Get help right awayif you feel like you may hurt yourself or others, or have thoughts about taking your own life. Go to your nearest emergency room or: Call 911. Call the National Suicide Prevention Lifeline at 2055644422 or 988. This is open 24 hours a day. Text the Crisis Text Line at (929)077-7444. Summary Stress is a normal reaction to life events. It can cause problems if it happens too often or for too long. Practicing stress management techniques is the best way to treat stress. Counseling or talk therapy with a mental health provider may help if you are having trouble managing stress by yourself. This information is not intended to replace advice given to you by your health care provider. Make sure you discuss any questions you have with your health care provider. Document Revised: 09/14/2020 Document Reviewed: 09/14/2020 Elsevier Patient Education  2024 Elsevier Inc.      Edwina Barth, MD [facility]

## 2022-10-16 NOTE — Patient Instructions (Signed)

## 2022-10-16 NOTE — Assessment & Plan Note (Signed)
Stable.  No recent anginal episodes No recent use of nitroglycerin Continue metoprolol tartrate 12.5 mg twice a day on daily baby aspirin

## 2022-10-16 NOTE — Assessment & Plan Note (Signed)
Active and affecting quality of life. Known triggers.  Scheduled to see therapist next week Stress management discussed Recommend to start Zoloft 25 mg daily

## 2022-11-08 ENCOUNTER — Other Ambulatory Visit: Payer: Self-pay | Admitting: Emergency Medicine

## 2022-11-08 DIAGNOSIS — F4323 Adjustment disorder with mixed anxiety and depressed mood: Secondary | ICD-10-CM

## 2022-11-13 ENCOUNTER — Ambulatory Visit (INDEPENDENT_AMBULATORY_CARE_PROVIDER_SITE_OTHER): Payer: Medicare HMO | Admitting: Family Medicine

## 2022-11-13 ENCOUNTER — Encounter (INDEPENDENT_AMBULATORY_CARE_PROVIDER_SITE_OTHER): Payer: Self-pay | Admitting: Family Medicine

## 2022-11-13 VITALS — BP 110/70 | HR 52 | Temp 97.8°F | Ht 60.0 in | Wt 159.0 lb

## 2022-11-13 DIAGNOSIS — R1013 Epigastric pain: Secondary | ICD-10-CM | POA: Diagnosis not present

## 2022-11-13 DIAGNOSIS — E559 Vitamin D deficiency, unspecified: Secondary | ICD-10-CM | POA: Diagnosis not present

## 2022-11-13 DIAGNOSIS — Z6831 Body mass index (BMI) 31.0-31.9, adult: Secondary | ICD-10-CM | POA: Diagnosis not present

## 2022-11-13 DIAGNOSIS — E669 Obesity, unspecified: Secondary | ICD-10-CM

## 2022-11-13 NOTE — Progress Notes (Unsigned)
Chief Complaint:   OBESITY Courtney [name] is here to discuss her progress with her obesity treatment plan along with follow-up of her obesity related diagnoses. Courtney [name] is on the Category 1 Plan and states she is following her eating plan approximately 80% of the time. Courtney [name] states she is doing 0 minutes 0 times per week.  Today's visit was #: 67 Starting weight: 157 lbs Starting date: 06/16/2017 Today's weight: 159 lbs Today's date: 11/13/2022 Total lbs lost to date: 0 Total lbs lost since last in-office visit: 0  Interim History: Patient has struggled to follow her plan as closely.  She continues to have GI issues and finds starches tend to help settle her stomach.  Subjective:   1. Epigastric pain Patient notes her symptoms are intermittently, and improves with starches and with 1 dose of Mobic.  She has not seen her GI in years.  2. Vitamin D deficiency Patient's vitamin D level is close to over replaced.  She stopped her vitamin D as requested.  Assessment/Plan:   1. Epigastric pain Patient was encouraged to discuss this with her PCP.  I advised her to eat smaller meals with carbs but add lean protein as well.  2. Vitamin D deficiency Patient will continue to hold vitamin D, and we will recheck labs in 2 months.  3. BMI 31.0-31.9,adult  4. Obesity, Beginning BMI 30.66 Courtney [name] is currently in the action stage of change. As such, her goal is to continue with weight loss efforts. She has agreed to the Category 1 Plan.   Patient was encouraged to keep working on increasing her protein to help prevent her RMR from decreasing.  Exercise goals: All adults should avoid inactivity. Some physical activity is better than none, and adults who participate in any amount of physical activity gain some health benefits.  Behavioral modification strategies: increasing lean protein intake and meal planning and cooking strategies.  Courtney [name] has agreed to follow-up with our clinic in 4 weeks. She was  informed of the importance of frequent follow-up visits to maximize her success with intensive lifestyle modifications for her multiple health conditions.   Objective:   Blood pressure 110/70, pulse (!) 52, temperature 97.8 F (36.6 C), height 5' (1.524 m), weight 159 lb (72.1 kg), SpO2 98%. Body mass index is 31.05 kg/m.  Lab Results  Component Value Date   CREATININE 0.86 07/25/2022   BUN 22 07/25/2022   NA 137 07/25/2022   K 4.4 07/25/2022   CL 104 07/25/2022   CO2 26 07/25/2022   Lab Results  Component Value Date   ALT 12 07/25/2022   AST 18 07/25/2022   ALKPHOS 53 07/25/2022   BILITOT 0.4 07/25/2022   Lab Results  Component Value Date   HGBA1C 5.8 (H) 02/26/2022   HGBA1C 5.8 (H) 10/18/2021   HGBA1C 5.7 (H) 07/11/2021   HGBA1C 6.0 (H) 03/20/2021   HGBA1C 5.8 (H) 08/30/2020   Lab Results  Component Value Date   INSULIN 10.2 02/26/2022   INSULIN 6.7 10/18/2021   INSULIN 9.0 07/11/2021   INSULIN 8.2 03/20/2021   INSULIN 6.8 08/30/2020   Lab Results  Component Value Date   TSH 1.020 02/26/2022   Lab Results  Component Value Date   CHOL 127 02/26/2022   HDL 73 02/26/2022   LDLCALC 35 02/26/2022   TRIG 103 02/26/2022   CHOLHDL 1.6 05/15/2021   Lab Results  Component Value Date   VD25OH 90.1 10/15/2022   VD25OH 83.1 02/26/2022   VD25OH 63.8  10/18/2021   Lab Results  Component Value Date   WBC 8.0 07/25/2022   HGB 12.4 07/25/2022   HCT 37.1 07/25/2022   MCV 93.4 07/25/2022   PLT 229.0 07/25/2022   Lab Results  Component Value Date   IRON 117 10/17/2020   TIBC 431.2 10/17/2020   FERRITIN 133.2 10/17/2020   Attestation Statements:   Reviewed by clinician on day of visit: allergies, medications, problem list, medical history, surgical history, family history, social history, and previous encounter notes.   I, Burt Knack, am acting as transcriptionist for Quillian Quince, MD.  I have reviewed the above documentation for accuracy and  completeness, and I agree with the above. -  Quillian Quince, MD

## 2022-12-02 DIAGNOSIS — F4322 Adjustment disorder with anxiety: Secondary | ICD-10-CM | POA: Diagnosis not present

## 2022-12-04 ENCOUNTER — Telehealth: Payer: Self-pay | Admitting: Cardiology

## 2022-12-04 ENCOUNTER — Encounter: Payer: Self-pay | Admitting: Pharmacist

## 2022-12-04 ENCOUNTER — Other Ambulatory Visit (HOSPITAL_BASED_OUTPATIENT_CLINIC_OR_DEPARTMENT_OTHER): Payer: Self-pay

## 2022-12-04 MED ORDER — REPATHA SURECLICK 140 MG/ML ~~LOC~~ SOAJ: 140.0000 mg | SUBCUTANEOUS | 3 refills | Status: DC

## 2022-12-04 MED ORDER — INFLUENZA VAC A&B SURF ANT ADJ 0.5 ML IM SUSY
0.5000 mL | PREFILLED_SYRINGE | Freq: Once | INTRAMUSCULAR | 0 refills | Status: AC
  Filled 2022-12-04: qty 0.5, 1d supply, fill #0

## 2022-12-04 NOTE — Telephone Encounter (Signed)
Spoke with pt, advised her I have re-enrolled her in the [facility] grant for her Rep[employer]. Updated grant info sent to her via mychart. She was very appreciative for the assistance.

## 2022-12-04 NOTE — Telephone Encounter (Signed)
Pt c/o medication issue:  1. Name of Medication:   Evolocumab (REPATHA SURECLICK) 140 MG/ML SOAJ     2. How are you currently taking this medication (dosage and times per day)?   Inject 140 mg into the skin every 14 (fourteen) days.    3. Are you having a reaction (difficulty breathing--STAT)? No  4. What is your medication issue? Pt would like a callback regarding Kennedy Bucker for medication and it expiring on 10/17. Please advise

## 2022-12-10 DIAGNOSIS — F4322 Adjustment disorder with anxiety: Secondary | ICD-10-CM | POA: Diagnosis not present

## 2022-12-12 ENCOUNTER — Ambulatory Visit (INDEPENDENT_AMBULATORY_CARE_PROVIDER_SITE_OTHER): Payer: Medicare HMO | Admitting: Family Medicine

## 2022-12-17 ENCOUNTER — Encounter (INDEPENDENT_AMBULATORY_CARE_PROVIDER_SITE_OTHER): Payer: Self-pay | Admitting: Family Medicine

## 2022-12-17 ENCOUNTER — Ambulatory Visit (INDEPENDENT_AMBULATORY_CARE_PROVIDER_SITE_OTHER): Payer: Medicare HMO | Admitting: Family Medicine

## 2022-12-17 VITALS — BP 140/53 | HR 54 | Temp 97.8°F | Ht 60.0 in | Wt 160.0 lb

## 2022-12-17 DIAGNOSIS — F4322 Adjustment disorder with anxiety: Secondary | ICD-10-CM | POA: Diagnosis not present

## 2022-12-17 DIAGNOSIS — Z6831 Body mass index (BMI) 31.0-31.9, adult: Secondary | ICD-10-CM

## 2022-12-17 DIAGNOSIS — E669 Obesity, unspecified: Secondary | ICD-10-CM | POA: Diagnosis not present

## 2022-12-17 DIAGNOSIS — F439 Reaction to severe stress, unspecified: Secondary | ICD-10-CM | POA: Diagnosis not present

## 2022-12-17 DIAGNOSIS — I10 Essential (primary) hypertension: Secondary | ICD-10-CM | POA: Diagnosis not present

## 2022-12-17 NOTE — Progress Notes (Unsigned)
Chief Complaint:   OBESITY Courtney [name] is here to discuss her progress with her obesity treatment plan along with follow-up of her obesity related diagnoses. [name] is on the Category 1 Plan and states she is following her eating plan approximately 80% of the time. [name] states she is walking for 30 minutes 2-3 times per week.  Today's visit was #: 68 Starting weight: 157 lbs Starting date: 06/16/2016 Today's weight: 160 lbs Today's date: 12/17/2022 Total lbs lost to date: 0 Total lbs lost since last in-office visit: 0  Interim History: Patient is struggling with her weight loss.  She is mindful of her food choices and she is open to looking at other eating plan options.  Subjective:   1. Essential hypertension Patient's blood pressure is mildly elevated.  She is on ACE and beta-blocker.  She denies chest pain or headache, or worsening pain.  2. Stress Patient started talking with a counselor and she is hoping this will help her deal with her challenges.  Assessment/Plan:   1. Essential hypertension Patient will continue her medications, and will work on her weight loss.  We will recheck her blood pressure at next visit in 6 weeks.  2. Stress Emotional eating behavior strategies were discussed with the patient.  She will continue with her counselor and we will follow-up at her next visit in 6 weeks.  3. BMI 31.0-31.9,adult  4. Obesity, Beginning BMI 30.66 Courtney [name] is currently in the action stage of change. As such, her goal is to continue with weight loss efforts. She has agreed to the Category 1 Plan or the Pescatarian Plan.   Exercise goals: As is.   Behavioral modification strategies: increasing lean protein intake, meal planning and cooking strategies, and holiday eating strategies(Halloween).  Courtney [name] has agreed to follow-up with our clinic in 6 weeks. She was informed of the importance of frequent follow-up visits to maximize her success with intensive lifestyle  modifications for her multiple health conditions.   Objective:   Blood pressure (!) 140/53, pulse (!) 54, temperature 97.8 F (36.6 C), height 5' (1.524 m), weight 160 lb (72.6 kg), SpO2 98%. Body mass index is 31.25 kg/m.  Lab Results  Component Value Date   CREATININE 0.86 07/25/2022   BUN 22 07/25/2022   NA 137 07/25/2022   K 4.4 07/25/2022   CL 104 07/25/2022   CO2 26 07/25/2022   Lab Results  Component Value Date   ALT 12 07/25/2022   AST 18 07/25/2022   ALKPHOS 53 07/25/2022   BILITOT 0.4 07/25/2022   Lab Results  Component Value Date   HGBA1C 5.8 (H) 02/26/2022   HGBA1C 5.8 (H) 10/18/2021   HGBA1C 5.7 (H) 07/11/2021   HGBA1C 6.0 (H) 03/20/2021   HGBA1C 5.8 (H) 08/30/2020   Lab Results  Component Value Date   INSULIN 10.2 02/26/2022   INSULIN 6.7 10/18/2021   INSULIN 9.0 07/11/2021   INSULIN 8.2 03/20/2021   INSULIN 6.8 08/30/2020   Lab Results  Component Value Date   TSH 1.020 02/26/2022   Lab Results  Component Value Date   CHOL 127 02/26/2022   HDL 73 02/26/2022   LDLCALC 35 02/26/2022   TRIG 103 02/26/2022   CHOLHDL 1.6 05/15/2021   Lab Results  Component Value Date   VD25OH 90.1 10/15/2022   VD25OH 83.1 02/26/2022   VD25OH 63.8 10/18/2021   Lab Results  Component Value Date   WBC 8.0 07/25/2022   HGB 12.4 07/25/2022   HCT 37.1 07/25/2022  MCV 93.4 07/25/2022   PLT 229.0 07/25/2022   Lab Results  Component Value Date   IRON 117 10/17/2020   TIBC 431.2 10/17/2020   FERRITIN 133.2 10/17/2020   Attestation Statements:   Reviewed by clinician on day of visit: allergies, medications, problem list, medical history, surgical history, family history, social history, and previous encounter notes.   I, Burt Knack, am acting as transcriptionist for Quillian Quince, MD.  I have reviewed the above documentation for accuracy and completeness, and I agree with the above. -  Quillian Quince, MD

## 2022-12-30 DIAGNOSIS — F4322 Adjustment disorder with anxiety: Secondary | ICD-10-CM | POA: Diagnosis not present

## 2023-01-20 DIAGNOSIS — F4322 Adjustment disorder with anxiety: Secondary | ICD-10-CM | POA: Diagnosis not present

## 2023-01-27 ENCOUNTER — Ambulatory Visit (INDEPENDENT_AMBULATORY_CARE_PROVIDER_SITE_OTHER): Payer: Medicare HMO

## 2023-01-27 VITALS — BP 141/69 | HR 54 | Temp 97.8°F | Ht 60.0 in | Wt 160.0 lb

## 2023-01-27 DIAGNOSIS — Z0000 Encounter for general adult medical examination without abnormal findings: Secondary | ICD-10-CM | POA: Diagnosis not present

## 2023-01-27 NOTE — Patient Instructions (Signed)
It was great speaking with you today!  Please schedule your next Medicare Wellness Visit with your Nurse Health Advisor in 1 year by calling [phone]. 

## 2023-01-27 NOTE — Progress Notes (Signed)
Subjective:   Courtney [name] is a 72 y.o. female who presents for Medicare Annual (Subsequent) preventive examination.  Visit Complete: Virtual I connected with  Courtney [name] on 01/27/23 by a audio enabled telemedicine application and verified that I am speaking with the correct person using two identifiers.  Patient Location: Home  Provider Location: Office/Clinic  I discussed the limitations of evaluation and management by telemedicine. The patient expressed understanding and agreed to proceed.  Vital Signs: Because this visit was a virtual/telehealth visit, some criteria may be missing or patient reported. Any vitals not documented were not able to be obtained and vitals that have been documented are patient reported.  Patient Medicare AWV questionnaire was completed by the patient on 01/25/2023; I have confirmed that all information answered by patient is correct and no changes since this date.  Cardiac Risk Factors include: advanced age (>67men, >23 women);dyslipidemia;hypertension;obesity (BMI >30kg/m2)     Objective:    Today's Vitals   01/27/23 1000 01/27/23 1004 01/27/23 1005  BP: (!) 148/71 (!) 141/69   Pulse: 72 (!) 54   Temp: 97.8 F (36.6 C)    SpO2: 99%    Weight: 160 lb (72.6 kg)    Height: 5' (1.524 m)    PainSc:   5    Body mass index is 31.25 kg/m. Vitals reported by patient, she took BP and pulse again later in the morning after taking medication.       01/27/2023   10:09 AM 09/25/2021   11:58 AM 09/24/2021    8:22 AM 09/18/2020   11:28 PM 03/22/2020   12:07 PM 09/29/2018   11:17 AM 04/29/2018    2:20 PM  Advanced Directives  Does Patient Have a Medical Advance Directive? Yes Yes Yes No Yes Yes Yes  Type of Estate agent of [place];Living will Healthcare Power of [place];Living will Healthcare Power of [place];Living will   Healthcare Power of [place];Living will Healthcare Power of [place];Living will  Does patient want to make  changes to medical advance directive? No - Patient declined    No - Patient declined No - Patient declined   Copy of Healthcare Power of Attorney in Chart? No - copy requested No - copy requested No - copy requested    No - copy requested  Would patient like information on creating a medical advance directive?    No - Patient declined       Current Medications (verified) Outpatient Encounter Medications as of 01/27/2023  Medication Sig   aspirin 81 MG tablet Take 81 mg by mouth daily.   clobetasol cream (TEMOVATE) 0.05 % Apply 1-2 times a day to [place] for 2 weeks   Evolocumab (REPATHA SURECLICK) 140 MG/ML SOAJ Inject 140 mg into the skin every 14 (fourteen) days.   ezetimibe (ZETIA) 10 MG tablet Take 1 tablet (10 mg total) by mouth daily.   gabapentin (NEURONTIN) 300 MG capsule Take 1 capsule (300 mg total) by mouth at bedtime.   metoprolol tartrate (LOPRESSOR) 25 MG tablet Take 0.5 tablets (12.5 mg total) by mouth 2 (two) times daily.   nitroGLYCERIN (NITROSTAT) 0.4 MG SL tablet Place 1 tablet (0.4 mg total) under the tongue every 5 (five) minutes as needed for chest pain.   pantoprazole (PROTONIX) 40 MG tablet Take 1 tablet (40 mg total) by mouth daily.   ramipril (ALTACE) 10 MG capsule Take 1 capsule (10 mg total) by mouth daily.   rosuvastatin (CRESTOR) 40 MG tablet Take 1  tablet (40 mg total) by mouth daily.   Vitamin D, Ergocalciferol, (DRISDOL) 1.25 MG (50000 UNIT) CAPS capsule Take 1 capsule (50,000 Units total) by mouth every 7 (seven) days.   No facility-administered encounter medications on file as of 01/27/2023.    Allergies (verified) Erythromycin, Penicillins, Shellfish allergy, and Doxycycline   History: Past Medical History:  Diagnosis Date   Aortic valve sclerosis    echo 12/2008, soft systolic murmur   Arthritis    in right hip   Back pain    Basal cell carcinoma 05/31/2016   bcc left forehead  tx exc   Carotid artery disease (HCC)    40-59% bilateral,  doppler December 2010   Cataract    Constipation    Coronary artery disease    a. BMS-mid RCA 11/2007 b. stress echo 02/2009 subtle inferior HK, lateral ST depressions with stress c. follow-up cath 02/2009: RCA stent patent, severe but stable stenosis of distal posterior lateral branch RCA. LV normal, med tx unless more sx c. ETT Myoview 08/27/12: negative for ischemia; EF 64%   Decreased hearing    Depression    Mild, August, 2012   Dyslipidemia    Easy bruising    Ejection fraction    EF normal, catheterization, 2011 /  EF 55%, echo, 2010   Fluid overload    Mild, stable since hospitalization in the past   GERD (gastroesophageal reflux disease)    occarionally, takes Pepcid over the counter   Heart murmur    has, occasional PVC's   History of right hip replacement    Hyperlipidemia    Hypertension    Joint pain    Myocardial infarction (HCC)    2009   Palpitations    PVC (premature ventricular contraction)    per prior Holter monitor   Right hip pain 12/2009   Shellfish allergy    Stress fracture    Left foot   Trouble in sleeping    Vitamin B12 deficiency    Vitamin D deficiency    Past Surgical History:  Procedure Laterality Date   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/01/2005   BREAST BIOPSY Left    carcinoid tumor removal     colon-   CARDIAC CATHETERIZATION  1/211   Patent RCA stent, stenotic PLB supplying small distribution; medically managed   CATARACT EXTRACTION, BILATERAL     CORONARY ANGIOPLASTY WITH STENT PLACEMENT  11/2007   BMS-mid RCA   DIAGNOSTIC LAPAROSCOPY     1982 for endometriosis   DILATION AND CURETTAGE OF UTERUS  2005   SKIN CANCER EXCISION     TOTAL HIP ARTHROPLASTY Right 07/08/2014   Procedure: RIGHT TOTAL HIP ARTHROPLASTY ANTERIOR APPROACH;  Surgeon: Kathryne Hitch, MD;  Location: [facility];  Service: Orthopedics;  Laterality: Right;   WISDOM TOOTH EXTRACTION     Family History  Problem Relation Age of Onset   Heart attack Father 15    Hyperlipidemia Father    Hypertension Father    Sudden death Father    Stroke Mother    Hyperlipidemia Mother    Hypertension Mother    Heart disease Mother    Obesity Mother    Heart attack Paternal Grandfather    Heart disease Sister    BRCA 1/2 Neg Hx    Breast cancer Neg Hx    Esophageal cancer Neg Hx    Colon cancer Neg Hx    Pancreatic cancer Neg Hx    Liver disease Neg Hx    Stomach cancer  Neg Hx    Social History   Socioeconomic History   Marital status: Divorced    Spouse name: Not on file   Number of children: 1   Years of education: 54   Highest education level: Master's degree (e.g., MA, MS, MEng, MEd, MSW, MBA)  Occupational History   Occupation: Retired - Publishing copy  Tobacco Use   Smoking status: Never   Smokeless tobacco: Never  Vaping Use   Vaping status: Never Used  Substance and Sexual Activity   Alcohol use: Yes    Comment: 1 glass of wine 3x/week   Drug use: No   Sexual activity: Yes  Other Topics Concern   Not on file  Social History Narrative   Right handed   Soda- sometimes   Drinks herbal tea   Fun: Dance, garden,    Denies religious beliefs effecting health care.   Denies abuse and feels safe at home   Social Determinants of Health   Financial Resource Strain: Low Risk  (01/27/2023)   Overall Financial Resource Strain (CARDIA)    Difficulty of Paying Living Expenses: Not hard at all  Food Insecurity: No Food Insecurity (01/27/2023)   Hunger Vital Sign    Worried About Running Out of Food in the Last Year: Never true    Ran Out of Food in the Last Year: Never true  Transportation Needs: No Transportation Needs (01/27/2023)   PRAPARE - Administrator, Civil Service (Medical): No    Lack of Transportation (Non-Medical): No  Physical Activity: Insufficiently Active (01/27/2023)   Exercise Vital Sign    Days of Exercise per Week: 2 days    Minutes of Exercise per Session: 30 min  Stress: Stress Concern Present  (01/27/2023)   [employer] of Occupational Health - Occupational Stress Questionnaire    Feeling of Stress : To some extent  Social Connections: Moderately Isolated (01/27/2023)   Social Connection and Isolation Panel [NHANES]    Frequency of Communication with Friends and Family: More than three times a week    Frequency of Social Gatherings with Friends and Family: Twice a week    Attends Religious Services: Never    Database administrator or Organizations: Yes    Attends Banker Meetings: Never    Marital Status: Divorced    Tobacco Counseling Counseling given: Not Answered   Clinical Intake:  Pre-visit preparation completed: Yes  Pain : 0-10 Pain Score: 5  Pain Type: Chronic pain Pain Location: Back Pain Orientation: Lower Pain Descriptors / Indicators: Aching Pain Onset: More than a month ago Pain Frequency: Intermittent     BMI - recorded: 31.25 Nutritional Status: BMI > 30  Obese Nutritional Risks: Other (Comment) Diabetes: No  How often do you need to have someone help you when you read instructions, pamphlets, or other written materials from your doctor or pharmacy?: 1 - Never What is the last grade level you completed in school?: Masters Degree  Interpreter Needed?: No  Information entered by :: Elyse Jarvis, CMA   Activities of Daily Living    01/27/2023   10:10 AM 01/25/2023   12:07 PM  In your present state of health, do you have any difficulty performing the following activities:  Hearing? 0 0  Vision? 0 0  Difficulty concentrating or making decisions? 0 0  Walking or climbing stairs? 0 0  Dressing or bathing? 0 0  Doing errands, shopping? 0 0  Preparing Food and eating ? N N  Using the Toilet? N N  In the past six months, have you accidently leaked urine? Y Y  Comment waiting to long to go to the bathroom   Do you have problems with loss of bowel control? N N  Managing your Medications? N N  Managing your Finances? N N   Housekeeping or managing your Housekeeping? N N    Patient Care Team: Georgina Quint, MD as PCP - General (Internal Medicine) [place], Peter M, MD as PCP - Cardiology (Cardiology) Wilder Glade, MD as Consulting Physician (Family Medicine) [place], Peter M, MD as Consulting Physician (Cardiology) Carman Ching, OD as Consulting Physician (Optometry) Janalyn Harder, MD (Inactive) as Consulting Physician (Dermatology)  Indicate any recent Medical Services you may have received from other than [facility] providers in the past year (date may be approximate).     Assessment:   This is a routine wellness examination for Symonds.  Hearing/Vision screen Patient has some hearing difficulty but does not wear hearing aids. Patient wears glasses.    Goals Addressed             This Visit's Progress    Patient Stated       I would like to continue to work on losing weight and exercise more.       Depression Screen    01/27/2023   10:09 AM 10/16/2022   10:43 AM 07/25/2022    2:07 PM 06/03/2022   10:29 AM 04/17/2022   10:43 AM 10/15/2021   10:48 AM 09/24/2021    8:23 AM  PHQ 2/9 Scores  PHQ - 2 Score 0 3 0 0 2 0 0  PHQ- 9 Score  10   5      Fall Risk    01/27/2023   10:10 AM 01/25/2023   12:07 PM 10/16/2022   10:42 AM 07/25/2022    2:07 PM 06/03/2022   10:29 AM  Fall Risk   Falls in the past year? 1 1 1 1  0  Number falls in past yr: 1 1 0 0 0  Injury with Fall? 0 1 1 0 0  Risk for fall due to : No Fall Risks  History of fall(s)  No Fall Risks  Follow up Falls evaluation completed  Falls evaluation completed Falls evaluation completed Falls evaluation completed    MEDICARE RISK AT HOME: Medicare Risk at Home Any stairs in or around the home?: Yes If so, are there any without handrails?: No Home free of loose throw rugs in walkways, pet beds, electrical cords, etc?: Yes Adequate lighting in your home to reduce risk of falls?: Yes Life alert?: No Use of a cane, walker or  w/c?: No Grab bars in the bathroom?: Yes Shower chair or bench in shower?: No Elevated toilet seat or a handicapped toilet?: No  TIMED UP AND GO:  Was the test performed?  No    Cognitive Function:  Patient is cogitatively intact.      01/27/2023   10:11 AM 09/24/2021    8:26 AM  6CIT Screen  What Year? 0 points 0 points  What month? 0 points 0 points  What time? 0 points 0 points  Count back from 20 0 points 0 points  Months in reverse 0 points 0 points  Repeat phrase 0 points 0 points  Total Score 0 points 0 points    Immunizations Immunization History  Administered Date(s) Administered   Fluad Quad(high Dose 65+) 10/20/2018, 12/09/2019, 11/09/2021   Fluad Trivalent(High Dose 65+)  12/04/2022   Influenza, High Dose Seasonal PF 12/03/2016, 11/17/2017   Influenza-Unspecified 10/20/2014, 11/30/2020   PFIZER(Purple Top)SARS-COV-2 Vaccination 02/26/2019, 03/17/2019, 12/09/2019   Pneumococcal Conjugate-13 02/18/2014, 01/06/2015   Pneumococcal Polysaccharide-23 11/29/2015   Pneumococcal-Unspecified 02/18/2014   Tdap 11/29/2015   Zoster Recombinant(Shingrix) 12/26/2016, 02/25/2017    TDAP status: Up to date  Flu Vaccine status: Up to date  Pneumococcal vaccine status: Up to date  Covid-19 vaccine status: Completed vaccines  Qualifies for Shingles Vaccine? Yes   Zostavax completed No   Shingrix Completed?: Yes  Screening Tests Health Maintenance  Topic Date Due   Cervical Cancer Screening (Pap smear)  11/23/2019   Medicare Annual Wellness (AWV)  09/25/2022   COVID-19 Vaccine (4 - 2023-24 season) 10/20/2022   Colonoscopy  02/23/2023   MAMMOGRAM  05/27/2023   DTaP/Tdap/Td (2 - Td or Tdap) 11/28/2025   Pneumonia Vaccine 3+ Years old  Completed   INFLUENZA VACCINE  Completed   DEXA SCAN  Completed   Hepatitis C Screening  Completed   Zoster Vaccines- Shingrix  Completed   HPV VACCINES  Aged Out    Health Maintenance  Health Maintenance Due  Topic Date Due    Cervical Cancer Screening (Pap smear)  11/23/2019   Medicare Annual Wellness (AWV)  09/25/2022   COVID-19 Vaccine (4 - 2023-24 season) 10/20/2022   Colonoscopy  02/23/2023    Colorectal cancer screening: Type of screening: Colonoscopy. Completed 02/22/2013. Repeat every 10 years  Mammogram status: Completed 05/27/2022. Repeat every year  Bone Density status: Completed 09/09/2022. Results reflect: Bone density results: OSTEOPENIA. Repeat every N/A years.  Lung Cancer Screening: (Low Dose CT Chest recommended if Age 3-80 years, 20 pack-year currently smoking OR have quit w/in 15years.) does not qualify.   Lung Cancer Screening Referral: N/A  Additional Screening:  Hepatitis C Screening: does qualify; Completed 06/06/2015  Vision Screening: Recommended annual ophthalmology exams for early detection of glaucoma and other disorders of the eye. Is the patient up to date with their annual eye exam?  Yes  Who is the provider or what is the name of the office in which the patient attends annual eye exams? [facility] If pt is not established with a provider, would they like to be referred to a provider to establish care? No .   Dental Screening: Recommended annual dental exams for proper oral hygiene   Community Resource Referral / Chronic Care Management: CRR required this visit?  No   CCM required this visit?  No     Plan:     I have personally reviewed and noted the following in the patient's chart:   Medical and social history Use of alcohol, tobacco or illicit drugs  Current medications and supplements including opioid prescriptions. Patient is not currently taking opioid prescriptions. Functional ability and status Nutritional status Physical activity Advanced directives List of other physicians Hospitalizations, surgeries, and ER visits in previous 12 months Vitals Screenings to include cognitive, depression, and falls Referrals and appointments  In addition, I have  reviewed and discussed with patient certain preventive protocols, quality metrics, and best practice recommendations. A written personalized care plan for preventive services as well as general preventive health recommendations were provided to patient.     Marinus Maw, CMA   01/27/2023   After Visit Summary: (MyChart) Due to this being a telephonic visit, the after visit summary with patients personalized plan was offered to patient via MyChart   Nurse Notes: Patient is requesting Dr. Ivory Broad look at her recent  BP readings and if there needs to be any adjustments made to her medication?

## 2023-01-28 ENCOUNTER — Ambulatory Visit (INDEPENDENT_AMBULATORY_CARE_PROVIDER_SITE_OTHER): Payer: Medicare HMO | Admitting: Family Medicine

## 2023-01-28 ENCOUNTER — Encounter (INDEPENDENT_AMBULATORY_CARE_PROVIDER_SITE_OTHER): Payer: Self-pay | Admitting: Family Medicine

## 2023-01-28 VITALS — BP 108/63 | HR 55 | Temp 98.3°F | Ht 60.0 in | Wt 160.0 lb

## 2023-01-28 DIAGNOSIS — I10 Essential (primary) hypertension: Secondary | ICD-10-CM

## 2023-01-28 DIAGNOSIS — E559 Vitamin D deficiency, unspecified: Secondary | ICD-10-CM | POA: Diagnosis not present

## 2023-01-28 DIAGNOSIS — F439 Reaction to severe stress, unspecified: Secondary | ICD-10-CM | POA: Diagnosis not present

## 2023-01-28 DIAGNOSIS — Z6831 Body mass index (BMI) 31.0-31.9, adult: Secondary | ICD-10-CM

## 2023-01-28 DIAGNOSIS — Z6830 Body mass index (BMI) 30.0-30.9, adult: Secondary | ICD-10-CM

## 2023-01-28 DIAGNOSIS — E669 Obesity, unspecified: Secondary | ICD-10-CM

## 2023-01-28 NOTE — Progress Notes (Signed)
.smr  Office: [phone]  /  Fax: [phone]  WEIGHT SUMMARY AND BIOMETRICS  Anthropometric Measurements Height: 5' (1.524 m) Weight: 160 lb (72.6 kg) BMI (Calculated): 31.25 Weight at Last Visit: 160 lb Weight Lost Since Last Visit: 0 Weight Gained Since Last Visit: 0 Starting Weight: 157 lb Total Weight Loss (lbs): 0 lb (0 kg) Peak Weight: 167 lb   Body Composition  Body Fat %: 43.5 % Fat Mass (lbs): 69.6 lbs Muscle Mass (lbs): 85.8 lbs Total Body Water (lbs): 59.2 lbs Visceral Fat Rating : 13   Other Clinical Data Fasting: no Labs: no Today's Visit #: 70 Starting Date: 07/10/22    Chief Complaint: OBESITY   History of Present Illness   The patient, with a history of hypertension, vitamin D deficiency, and obesity, presents for a follow-up visit. Her blood pressure is well controlled on ramipril and metoprolol. She is also on prescription vitamin D 50,000 international units weekly. The patient has been adhering to a pescetarian diet plan 85% of the time and has maintained her weight over the last six weeks. She reports walking for exercise for 30 minutes twice a week.  The patient has been managing stress through virtual visits with a mental health organization, [employer], every two weeks. She reports improved communication with her adult child and better response to stress. She also mentions occasional stress eating, but can often talk herself out of it.  The patient reports waking up at night and eating a piece of bread with peanut butter and drinking sleepy time tea to help her go back to sleep. She also mentions enjoying tuna pouches as a quick protein source.  The patient has stopped taking her vitamin D supplement due to a slightly elevated level in her last lab results. She is due for a follow-up lab check. She also mentions a recent change in her health insurance to [employer].          PHYSICAL EXAM:  Blood pressure 108/63, pulse (!) 55,  temperature 98.3 F (36.8 C), height 5' (1.524 m), weight 160 lb (72.6 kg), SpO2 97%. Body mass index is 31.25 kg/m.  DIAGNOSTIC DATA REVIEWED:  BMET    Component Value Date/Time   NA 137 07/25/2022 1442   NA 141 02/26/2022 1046   K 4.4 07/25/2022 1442   CL 104 07/25/2022 1442   CO2 26 07/25/2022 1442   GLUCOSE 92 07/25/2022 1442   BUN 22 07/25/2022 1442   BUN 17 02/26/2022 1046   CREATININE 0.86 07/25/2022 1442   CALCIUM 9.3 07/25/2022 1442   GFRNONAA 55 (L) 03/30/2020 1030   GFRAA 63 03/30/2020 1030   Lab Results  Component Value Date   HGBA1C 5.8 (H) 02/26/2022   HGBA1C 5.6 06/16/2017   Lab Results  Component Value Date   INSULIN 10.2 02/26/2022   INSULIN 6.4 06/16/2017   Lab Results  Component Value Date   TSH 1.020 02/26/2022   CBC    Component Value Date/Time   WBC 8.0 07/25/2022 1442   RBC 3.97 07/25/2022 1442   HGB 12.4 07/25/2022 1442   HGB 13.3 08/04/2019 1057   HCT 37.1 07/25/2022 1442   HCT 39.7 08/04/2019 1057   PLT 229.0 07/25/2022 1442   PLT 223 08/04/2019 1057   MCV 93.4 07/25/2022 1442   MCV 95 08/04/2019 1057   MCH 31.7 08/04/2019 1057   MCH 31.8 10/14/2017 1923   MCHC 33.4 07/25/2022 1442   RDW 13.3 07/25/2022 1442   RDW 12.1  08/04/2019 1057   Iron Studies    Component Value Date/Time   IRON 117 10/17/2020 1013   IRON 100 01/25/2019 0853   TIBC 431.2 10/17/2020 1013   TIBC 328 01/25/2019 0853   FERRITIN 133.2 10/17/2020 1013   FERRITIN 54 01/25/2019 0853   IRONPCTSAT 27.1 10/17/2020 1013   IRONPCTSAT 30 01/25/2019 0853   Lipid Panel     Component Value Date/Time   CHOL 127 02/26/2022 1046   TRIG 103 02/26/2022 1046   HDL 73 02/26/2022 1046   CHOLHDL 1.6 05/15/2021 1000   CHOLHDL 3 11/30/2015 1028   VLDL 25.4 11/30/2015 1028   LDLCALC 35 02/26/2022 1046   Hepatic Function Panel     Component Value Date/Time   PROT 7.0 07/25/2022 1442   PROT 6.5 02/26/2022 1046   ALBUMIN 4.4 07/25/2022 1442   ALBUMIN 4.4 02/26/2022  1046   AST 18 07/25/2022 1442   ALT 12 07/25/2022 1442   ALKPHOS 53 07/25/2022 1442   BILITOT 0.4 07/25/2022 1442   BILITOT 0.3 02/26/2022 1046   BILIDIR 0.13 05/15/2021 1000      Component Value Date/Time   TSH 1.020 02/26/2022 1046   Nutritional Lab Results  Component Value Date   VD25OH 90.1 10/15/2022   VD25OH 83.1 02/26/2022   VD25OH 63.8 10/18/2021     Assessment and Plan    Hypertension Hypertension well-controlled with current medications (ramipril and metoprolol). Blood pressure today is 163/63, with home readings of 150-160/70s. Possible dehydration noted. Discussed importance of hydration and regular monitoring. - Continue ramipril and metoprolol - Maintain hydration - Keep a blood pressure diary and email results if necessary  Vitamin D Deficiency Managed with prescription vitamin D 50,000 IU weekly. Patient discontinued supplementation. Last vitamin D level was 90 in August, slightly high. Discussed importance of adequate vitamin D levels and risks of deficiency. Patient prefers to recheck levels before resuming supplementation. - Check vitamin D levels at next visit - Schedule fasting labs for next visit  Obesity and Stress Managed with dietary modifications and exercise. Patient follows a pescetarian diet 85% of the time and walks 30 minutes twice a week. Reports stress eating and difficulty consuming recommended protein. Discussed benefits of increasing exercise frequency and addressing stress eating. - Continue current diet and exercise regimen - Consider increasing exercise frequency - Monitor and address stress eating behaviors  General Health Maintenance Due for routine fasting labs. Discussed importance of regular health maintenance and routine screenings. - Schedule next visit in approximately 4 weeks - Ensure fasting labs are done at next visit.       She was informed of the importance of frequent follow up visits to maximize her success with  intensive lifestyle modifications for her multiple health conditions.    Quillian Quince, MD

## 2023-02-03 DIAGNOSIS — F4322 Adjustment disorder with anxiety: Secondary | ICD-10-CM | POA: Diagnosis not present

## 2023-02-14 ENCOUNTER — Ambulatory Visit: Payer: Self-pay | Admitting: Emergency Medicine

## 2023-02-14 NOTE — Telephone Encounter (Signed)
Patient has appt 12/30 w dr. Jonny Ruiz

## 2023-02-14 NOTE — Telephone Encounter (Addendum)
Copied from CRM [id number]. Topic: Clinical - Red Word Triage >> Feb 14, 2023 11:03 AM Almira Coaster wrote: Red Word that prompted transfer to Nurse Triage:Patient called with complaints of high BP 182/81, She has taken medication as advised but it's not helping. Patient is dealing with a stressful situation with her family.   Chief Complaint: Elevated blood pressure Symptoms: Elevated blood pressure Frequency: Past 5 days Pertinent Negatives: Patient denies dizziness or headaches Disposition: [] ED /[] Urgent Care (no appt availability in office) / [x] Appointment(In office/virtual)/ []  [facility]/ [] Home Care/ [] Refused Recommended Disposition /[] [facility]/ []  Follow-up with PCP  Additional Notes: Patient's daughter has mental health issues and was in a recent car accident. Patient stated she has been arguing a lot with her daughter lately and it is a rough situation. Patient believes that it is causing her stress which is elevating her blood pressure. Patient's most recent blood pressure was taken at 10:15 am. It was 178/73. She is taking Metoprolol. She reported that she should be taking 12 mg twice a day, but she increased it to 25 mg twice a day due to elevations in her blood pressure. She has been taking increased doses for the past 5 days. Advised patient to take her medication as it was prescribed and always notify the office of elevated blood pressures so the provider can determine if med adjustments are needed. Patient is currently asymptomatic. Appointment has been scheduled for soonest available day on 12/30. Please inform patient if any medication adjustment is recommended in the meantime. Patient was instructed to call the office back if blood pressure becomes higher or if she becomes symptomatic.   Reason for Disposition  Systolic BP  >= 160 OR Diastolic >= 100  Answer Assessment - Initial Assessment Questions 1. BLOOD PRESSURE: "What is the blood pressure?" "Did you  take at least two measurements 5 minutes apart?"     8:40 am: 182/101 (before breakfast and medication), 8:45 am: 160/80,  10:15 am: 178/73. Metoprolol was taken at was taken at 9:15 am  2. HOW: "How did you take your blood pressure?" (e.g., automatic home BP monitor, visiting nurse)     Home BP monitor  3. MEDICINES: "Are you taking any medicines for blood pressure?" "Have you missed any doses recently?"     No missed doses. Patient takes Metoprolol and should be taking 12.5 mg twice daily. Patient stated she has decided to increase the dose herself due to recent elevated readings. For the past 5 days, she has been take 25 mg twice daily.   4. OTHER SYMPTOMS: "Do you have any symptoms?" (e.g., blurred vision, chest pain, difficulty breathing, headache, weakness)     Patient denies any symptoms at this time  Protocols used: Blood Pressure - High-A-AH

## 2023-02-17 ENCOUNTER — Encounter: Payer: Self-pay | Admitting: Internal Medicine

## 2023-02-17 ENCOUNTER — Ambulatory Visit (INDEPENDENT_AMBULATORY_CARE_PROVIDER_SITE_OTHER): Payer: Medicare HMO | Admitting: Internal Medicine

## 2023-02-17 VITALS — BP 132/82 | HR 50 | Temp 98.3°F | Ht 60.0 in | Wt 163.0 lb

## 2023-02-17 DIAGNOSIS — I10 Essential (primary) hypertension: Secondary | ICD-10-CM | POA: Diagnosis not present

## 2023-02-17 DIAGNOSIS — R7303 Prediabetes: Secondary | ICD-10-CM

## 2023-02-17 DIAGNOSIS — F4323 Adjustment disorder with mixed anxiety and depressed mood: Secondary | ICD-10-CM

## 2023-02-17 MED ORDER — AMLODIPINE BESYLATE 5 MG PO TABS: 5.0000 mg | ORAL_TABLET | Freq: Every day | ORAL | 3 refills | Status: DC

## 2023-02-17 MED ORDER — METOPROLOL TARTRATE 25 MG PO TABS: 25.0000 mg | ORAL_TABLET | Freq: Two times a day (BID) | ORAL | 3 refills | Status: DC

## 2023-02-17 MED ORDER — ALPRAZOLAM 0.25 MG PO TABS: 0.2500 mg | ORAL_TABLET | Freq: Two times a day (BID) | ORAL | 0 refills | Status: DC | PRN

## 2023-02-17 NOTE — Assessment & Plan Note (Signed)
Lab Results  Component Value Date   HGBA1C 5.8 (H) 02/26/2022   Stable, pt to continue current medical treatment  - diet, wt control

## 2023-02-17 NOTE — Progress Notes (Signed)
Patient ID: Courtney [name], female   DOB: [birth year], 72 y.o.   MRN: [id number]        Chief Complaint: follow up htn, stress       HPI:  Courtney [name] is a 73 y.o. female here with above, having to take care of difficult daughter with emotional untreated issues with recent DWI and broken ankle;  Bps have been increased mildly elevated recently in the 160's.  Wt also in 160's.  Good compliance with meds, in fact on here own has increased the BB to 25 mg bid but not helped, HR in low 50's.  Pt denies chest pain, increased sob or doe, wheezing, orthopnea, PND, increased LE swelling, palpitations, dizziness or syncope.   Pt denies polydipsia, polyuria, or new focal neuro s/s.  Has declines to take SSRI per PCP.  Last taken xanax about 10 yrs ago, willing to take again now with the elevated BP/          Wt Readings from Last 3 Encounters:  02/17/23 163 lb (73.9 kg)  01/28/23 160 lb (72.6 kg)  01/27/23 160 lb (72.6 kg)   BP Readings from Last 3 Encounters:  02/17/23 132/82  01/28/23 108/63  01/27/23 (!) 141/69         Past Medical History:  Diagnosis Date   Aortic valve sclerosis    echo 12/2008, soft systolic murmur   Arthritis    in right hip   Back pain    Basal cell carcinoma 05/31/2016   bcc left forehead  tx exc   Carotid artery disease (HCC)    40-59% bilateral, doppler December 2010   Cataract    Constipation    Coronary artery disease    a. BMS-mid RCA 11/2007 b. stress echo 02/2009 subtle inferior HK, lateral ST depressions with stress c. follow-up cath 02/2009: RCA stent patent, severe but stable stenosis of distal posterior lateral branch RCA. LV normal, med tx unless more sx c. ETT Myoview 08/27/12: negative for ischemia; EF 64%   Decreased hearing    Depression    Mild, August, 2012   Dyslipidemia    Easy bruising    Ejection fraction    EF normal, catheterization, 2011 /  EF 55%, echo, 2010   Fluid overload    Mild, stable since hospitalization in the past   GERD  (gastroesophageal reflux disease)    occarionally, takes Pepcid over the counter   Heart murmur    has, occasional PVC's   History of right hip replacement    Hyperlipidemia    Hypertension    Joint pain    Myocardial infarction (HCC)    2009   Palpitations    PVC (premature ventricular contraction)    per prior Holter monitor   Right hip pain 12/2009   Shellfish allergy    Stress fracture    Left foot   Trouble in sleeping    Vitamin B12 deficiency    Vitamin D deficiency    Past Surgical History:  Procedure Laterality Date   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/01/2005   BREAST BIOPSY Left    carcinoid tumor removal     colon-   CARDIAC CATHETERIZATION  1/211   Patent RCA stent, stenotic PLB supplying small distribution; medically managed   CATARACT EXTRACTION, BILATERAL     CORONARY ANGIOPLASTY WITH STENT PLACEMENT  11/2007   BMS-mid RCA   DIAGNOSTIC LAPAROSCOPY     1982 for endometriosis   DILATION AND CURETTAGE OF UTERUS  2005  SKIN CANCER EXCISION     TOTAL HIP ARTHROPLASTY Right 07/08/2014   Procedure: RIGHT TOTAL HIP ARTHROPLASTY ANTERIOR APPROACH;  Surgeon: Kathryne Hitch, MD;  Location: [facility];  Service: Orthopedics;  Laterality: Right;   WISDOM TOOTH EXTRACTION      reports that she has never smoked. She has never used smokeless tobacco. She reports current alcohol use. She reports that she does not use drugs. family history includes Heart attack in her paternal grandfather; Heart attack (age of onset: 30) in her father; Heart disease in her mother and sister; Hyperlipidemia in her father and mother; Hypertension in her father and mother; Obesity in her mother; Stroke in her mother; Sudden death in her father. Allergies  Allergen Reactions   Erythromycin Hives   Penicillins Hives    Has patient had a PCN reaction causing immediate rash, facial/tongue/throat swelling, SOB or lightheadedness with hypotension: No Has patient had a PCN reaction causing severe rash  involving mucus membranes or skin necrosis: No Has patient had a PCN reaction that required hospitalization: No Has patient had a PCN reaction occurring within the last 10 years: No If all of the above answers are "NO", then may proceed with Cephalosporin use.    Shellfish Allergy Hives   Doxycycline Rash   Current Outpatient Medications on File Prior to Visit  Medication Sig Dispense Refill   aspirin 81 MG tablet Take 81 mg by mouth daily.     clobetasol cream (TEMOVATE) 0.05 % Apply 1-2 times a day to poison oak for 2 weeks 60 g 1   Evolocumab (REPATHA SURECLICK) 140 MG/ML SOAJ Inject 140 mg into the skin every 14 (fourteen) days. 6 mL 3   ezetimibe (ZETIA) 10 MG tablet Take 1 tablet (10 mg total) by mouth daily. 90 tablet 3   gabapentin (NEURONTIN) 300 MG capsule Take 1 capsule (300 mg total) by mouth at bedtime. 90 capsule 3   nitroGLYCERIN (NITROSTAT) 0.4 MG SL tablet Place 1 tablet (0.4 mg total) under the tongue every 5 (five) minutes as needed for chest pain. 25 tablet 11   pantoprazole (PROTONIX) 40 MG tablet Take 1 tablet (40 mg total) by mouth daily. 90 tablet 3   ramipril (ALTACE) 10 MG capsule Take 1 capsule (10 mg total) by mouth daily. 90 capsule 3   rosuvastatin (CRESTOR) 40 MG tablet Take 1 tablet (40 mg total) by mouth daily. 90 tablet 3   Vitamin D, Ergocalciferol, (DRISDOL) 1.25 MG (50000 UNIT) CAPS capsule Take 1 capsule (50,000 Units total) by mouth every 7 (seven) days. 4 capsule 0   No current facility-administered medications on file prior to visit.        ROS:  All others reviewed and negative.  Objective        PE:  BP 132/82 (BP Location: Right Arm, Patient Position: Sitting, Cuff Size: Normal)   Pulse (!) 50   Temp 98.3 F (36.8 C) (Oral)   Ht 5' (1.524 m)   Wt 163 lb (73.9 kg)   SpO2 97%   BMI 31.83 kg/m                 Constitutional: Pt appears in NAD               HENT: Head: NCAT.                Right Ear: External ear normal.                  Left  Ear: External ear normal.                Eyes: . Pupils are equal, round, and reactive to light. Conjunctivae and EOM are normal               Nose: without d/c or deformity               Neck: Neck supple. Gross normal ROM               Cardiovascular: Normal rate and regular rhythm.                 Pulmonary/Chest: Effort normal and breath sounds without rales or wheezing.                               Neurological: Pt is alert. At baseline orientation, motor grossly intact               Skin: Skin is warm. No rashes, no other new lesions, LE edema - onne               Psychiatric: Pt behavior is normal without agitation , mild nervous  Micro: none  Cardiac tracings I have personally interpreted today:  none  Pertinent Radiological findings (summarize): none   Lab Results  Component Value Date   WBC 8.0 07/25/2022   HGB 12.4 07/25/2022   HCT 37.1 07/25/2022   PLT 229.0 07/25/2022   GLUCOSE 92 07/25/2022   CHOL 127 02/26/2022   TRIG 103 02/26/2022   HDL 73 02/26/2022   LDLCALC 35 02/26/2022   ALT 12 07/25/2022   AST 18 07/25/2022   NA 137 07/25/2022   K 4.4 07/25/2022   CL 104 07/25/2022   CREATININE 0.86 07/25/2022   BUN 22 07/25/2022   CO2 26 07/25/2022   TSH 1.020 02/26/2022   INR 0.97 07/01/2014   HGBA1C 5.8 (H) 02/26/2022   Assessment/Plan:  Courtney [name] is a 72 y.o. White or Caucasian [1] female with  has a past medical history of Aortic valve sclerosis, Arthritis, Back pain, Basal cell carcinoma (05/31/2016), Carotid artery disease (HCC), Cataract, Constipation, Coronary artery disease, Decreased hearing, Depression, Dyslipidemia, Easy bruising, Ejection fraction, Fluid overload, GERD (gastroesophageal reflux disease), Heart murmur, History of right hip replacement, Hyperlipidemia, Hypertension, Joint pain, Myocardial infarction (HCC), Palpitations, PVC (premature ventricular contraction), Right hip pain (12/2009), Shellfish allergy, Stress fracture, Trouble in  sleeping, Vitamin B12 deficiency, and Vitamin D deficiency.  Essential hypertension BP Readings from Last 3 Encounters:  02/17/23 132/82  01/28/23 108/63  01/27/23 (!) 141/69   Uncontrolled, goal < 130/80, pt to continue medical treatment metoprolol 25 bid (increased herself), altace 10 every day, and add amllodipine 5 mg per day   Pre-diabetes Lab Results  Component Value Date   HGBA1C 5.8 (H) 02/26/2022   Stable, pt to continue current medical treatment  - diet, wt control   Adjustment reaction with anxiety and depression Much difficulty with daughter who has herself new problems with DUI and broken ankle living by herself and not working - for xanax 0.25 bid prn  Followup: Return in about 8 days (around 02/25/2023).  Oliver Barre, MD 02/17/2023 9:58 AM [facility]

## 2023-02-17 NOTE — Assessment & Plan Note (Signed)
Much difficulty with daughter who has herself new problems with DUI and broken ankle living by herself and not working - for xanax 0.25 bid prn

## 2023-02-17 NOTE — Assessment & Plan Note (Signed)
BP Readings from Last 3 Encounters:  02/17/23 132/82  01/28/23 108/63  01/27/23 (!) 141/69   Uncontrolled, goal < 130/80, pt to continue medical treatment metoprolol 25 bid (increased herself), altace 10 every day, and add amllodipine 5 mg per day

## 2023-02-17 NOTE — Patient Instructions (Addendum)
Please take all new medication as prescribed - the xanax as needed, and amlodipine 5 mg per day  Ok to continue the metoprolol at 25 mg twice per day  Please continue all other medications as before,  Please have the pharmacy call with any other refills you may need.  Please continue your efforts at being more active, low cholesterol diet, and weight control.  Please keep your appointments with your specialists as you may have planned - Dr Alvy Bimler on Jan 7

## 2023-02-20 ENCOUNTER — Encounter: Payer: Self-pay | Admitting: Cardiology

## 2023-02-21 ENCOUNTER — Telehealth: Payer: Self-pay | Admitting: Pharmacy Technician

## 2023-02-21 ENCOUNTER — Other Ambulatory Visit (HOSPITAL_COMMUNITY): Payer: Self-pay

## 2023-02-21 NOTE — Telephone Encounter (Signed)
 Pharmacy Patient Advocate Encounter   Received notification from Patient Advice Request messages that prior authorization for repatha  is required/requested.   Insurance verification completed.   The patient is insured through [facility] [employer] .   Per test claim: PA required; PA submitted to above mentioned insurance via CoverMyMeds Key/confirmation #/EOC [id number] Status is pending

## 2023-02-21 NOTE — Telephone Encounter (Signed)
 Pharmacy Patient Advocate Encounter  Received notification from [employer] that Prior Authorization for repatha  has been APPROVED from 02/21/23 to 08/21/23. Ran test claim, Copay is $117.50 3 months. This test claim was processed through [facility]- copay amounts may vary at other pharmacies due to pharmacy/plan contracts, or as the patient moves through the different stages of their insurance plan.   PA #/Case ID/Reference #: [id number]

## 2023-02-25 ENCOUNTER — Ambulatory Visit (INDEPENDENT_AMBULATORY_CARE_PROVIDER_SITE_OTHER): Payer: PPO | Admitting: Emergency Medicine

## 2023-02-25 ENCOUNTER — Encounter: Payer: Self-pay | Admitting: Emergency Medicine

## 2023-02-25 VITALS — BP 132/64 | HR 59 | Temp 97.9°F | Ht 60.0 in | Wt 165.0 lb

## 2023-02-25 DIAGNOSIS — F4323 Adjustment disorder with mixed anxiety and depressed mood: Secondary | ICD-10-CM

## 2023-02-25 DIAGNOSIS — I10 Essential (primary) hypertension: Secondary | ICD-10-CM | POA: Diagnosis not present

## 2023-02-25 DIAGNOSIS — E785 Hyperlipidemia, unspecified: Secondary | ICD-10-CM | POA: Diagnosis not present

## 2023-02-25 NOTE — Assessment & Plan Note (Signed)
 Well-controlled hypertension with normal blood pressure readings at home Recommend to continue amlodipine 5 mg daily, metoprolol tartrate 25 mg twice a day, and Altace 10 mg daily Cardiovascular risks associated with hypertension discussed Diet and nutrition discussed Situational anxiety contributing to hypertension episodes Stress management discussed

## 2023-02-25 NOTE — Progress Notes (Signed)
 Courtney [name] 73 y.o.   Chief Complaint  Patient presents with   Hypertension    Patient here for b/p f/u     HISTORY OF PRESENT ILLNESS: This is a 72 y.o. female here for follow-up of hypertension Also history of situational anxiety regarding daughter's overall situation BP Readings from Last 3 Encounters:  02/25/23 132/64  02/17/23 132/82  01/28/23 108/63     HPI   Prior to Admission medications   Medication Sig Start Date End Date Taking? Authorizing Provider  amLODipine  (NORVASC ) 5 MG tablet Take 1 tablet (5 mg total) by mouth daily. 02/17/23 02/17/24 Yes Norleen Lynwood ORN, MD  aspirin  81 MG tablet Take 81 mg by mouth daily.   Yes [provider]  clobetasol  cream (TEMOVATE ) 0.05 % Apply 1-2 times a day to poison [place] for 2 weeks 07/02/21  Yes Livingston Rigg, MD  Evolocumab  (REPATHA  SURECLICK) 140 MG/ML SOAJ Inject 140 mg into the skin every 14 (fourteen) days. 12/04/22  Yes Jordan, Peter M, MD  ezetimibe  (ZETIA ) 10 MG tablet Take 1 tablet (10 mg total) by mouth daily. 03/29/22 03/24/23 Yes Jordan, Peter M, MD  gabapentin  (NEURONTIN ) 300 MG capsule Take 1 capsule (300 mg total) by mouth at bedtime. 04/17/22  Yes Claudeen Leason, Emil Schanz, MD  metoprolol  tartrate (LOPRESSOR ) 25 MG tablet Take 1 tablet (25 mg total) by mouth 2 (two) times daily. 02/17/23  Yes Norleen Lynwood ORN, MD  nitroGLYCERIN  (NITROSTAT ) 0.4 MG SL tablet Place 1 tablet (0.4 mg total) under the tongue every 5 (five) minutes as needed for chest pain. 10/07/18  Yes Jordan, Peter M, MD  pantoprazole  (PROTONIX ) 40 MG tablet Take 1 tablet (40 mg total) by mouth daily. 04/30/22  Yes Javonda Suh, Emil Schanz, MD  ramipril  (ALTACE ) 10 MG capsule Take 1 capsule (10 mg total) by mouth daily. 03/29/22  Yes Jordan, Peter M, MD  rosuvastatin  (CRESTOR ) 40 MG tablet Take 1 tablet (40 mg total) by mouth daily. 03/29/22  Yes Jordan, Peter M, MD  ALPRAZolam  (XANAX ) 0.25 MG tablet Take 1 tablet (0.25 mg total) by mouth 2 (two) times daily as  needed for anxiety. Patient not taking: Reported on 02/25/2023 02/17/23   Norleen Lynwood ORN, MD  Vitamin D , Ergocalciferol , (DRISDOL ) 1.25 MG (50000 UNIT) CAPS capsule Take 1 capsule (50,000 Units total) by mouth every 7 (seven) days. 07/10/22   Verdon Parry D, MD    Allergies  Allergen Reactions   Erythromycin Hives   Penicillins Hives    Has patient had a PCN reaction causing immediate rash, facial/tongue/throat swelling, SOB or lightheadedness with hypotension: No Has patient had a PCN reaction causing severe rash involving mucus membranes or skin necrosis: No Has patient had a PCN reaction that required hospitalization: No Has patient had a PCN reaction occurring within the last 10 years: No If all of the above answers are NO, then may proceed with Cephalosporin use.    Shellfish Allergy Hives   Doxycycline  Rash    Patient Active Problem List   Diagnosis Date Noted   Adjustment reaction with anxiety and depression 10/16/2022   Epigastric pain 10/15/2022   Mixed hyperlipidemia 09/17/2022   BMI 31.0-31.9,adult 03/26/2022   Obesity, Beginning BMI 30.66 03/26/2022   Insulin  resistance 02/26/2022   Chronic pain of right knee 10/15/2021   Stress 08/30/2021   Pre-diabetes 07/11/2021   Degenerative disc disease, lumbar 05/19/2020   Restless leg syndrome 05/15/2020   Degenerative disc disease, cervical 02/02/2020   Polyarthralgia 01/03/2020   Patellofemoral arthritis  of right knee 06/16/2019   Vitamin D  deficiency, Vit D = 43.2 (01/25/19), Rx vit D 50K IU every 2 weeks 08/04/2018   Cyst of right breast 05/07/2018   Chronic headache disorder 02/08/2018   Essential hypertension 11/17/2017   Class 1 obesity with serious comorbidity and body mass index (BMI) of 30.0 to 30.9 in adult 07/15/2017   Insomnia 07/15/2017   Female stress incontinence 07/15/2017   Gastroesophageal reflux disease with esophagitis, Rx Protonix  02/25/2017   Osteopenia 06/27/2015   Osteoarthritis of right hip  07/08/2014   Carotid artery disease (HCC)    Aortic valve sclerosis    Dyslipidemia    Coronary artery disease     Past Medical History:  Diagnosis Date   Aortic valve sclerosis    echo 12/2008, soft systolic murmur   Arthritis    in right hip   Back pain    Basal cell carcinoma 05/31/2016   bcc left forehead  tx exc   Carotid artery disease (HCC)    40-59% bilateral, doppler December 2010   Cataract    Constipation    Coronary artery disease    a. BMS-mid RCA 11/2007 b. stress echo 02/2009 subtle inferior HK, lateral ST depressions with stress c. follow-up cath 02/2009: RCA stent patent, severe but stable stenosis of distal posterior lateral branch RCA. LV normal, med tx unless more sx c. ETT Myoview  08/27/12: negative for ischemia; EF 64%   Decreased hearing    Depression    Mild, August, 2012   Dyslipidemia    Easy bruising    Ejection fraction    EF normal, catheterization, 2011 /  EF 55%, echo, 2010   Fluid overload    Mild, stable since hospitalization in the past   GERD (gastroesophageal reflux disease)    occarionally, takes Pepcid  over the counter   Heart murmur    has, occasional PVC's   History of right hip replacement    Hyperlipidemia    Hypertension    Joint pain    Myocardial infarction (HCC)    2009   Palpitations    PVC (premature ventricular contraction)    per prior Holter monitor   Right hip pain 12/2009   Shellfish allergy    Stress fracture    Left foot   Trouble in sleeping    Vitamin B12 deficiency    Vitamin D  deficiency     Past Surgical History:  Procedure Laterality Date   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/01/2005   BREAST BIOPSY Left    carcinoid tumor removal     colon-   CARDIAC CATHETERIZATION  1/211   Patent RCA stent, stenotic PLB supplying small distribution; medically managed   CATARACT EXTRACTION, BILATERAL     CORONARY ANGIOPLASTY WITH STENT PLACEMENT  11/2007   BMS-mid RCA   DIAGNOSTIC LAPAROSCOPY     1982 for endometriosis    DILATION AND CURETTAGE OF UTERUS  2005   SKIN CANCER EXCISION     TOTAL HIP ARTHROPLASTY Right 07/08/2014   Procedure: RIGHT TOTAL HIP ARTHROPLASTY ANTERIOR APPROACH;  Surgeon: Lonni CINDERELLA Poli, MD;  Location: [facility];  Service: Orthopedics;  Laterality: Right;   WISDOM TOOTH EXTRACTION      Social History   Socioeconomic History   Marital status: Divorced    Spouse name: Not on file   Number of children: 1   Years of education: 18   Highest education level: Master's degree (e.g., MA, MS, MEng, MEd, MSW, MBA)  Occupational History   Occupation:  Retired - Publishing Copy  Tobacco Use   Smoking status: Never   Smokeless tobacco: Never  Vaping Use   Vaping status: Never Used  Substance and Sexual Activity   Alcohol use: Yes    Comment: 1 glass of wine 3x/week   Drug use: No   Sexual activity: Yes  Other Topics Concern   Not on file  Social History Narrative   Right handed   Soda- sometimes   Drinks herbal tea   Fun: Dance, garden,    Denies religious beliefs effecting health care.   Denies abuse and feels safe at home   Social Drivers of Health   Financial Resource Strain: Low Risk  (02/24/2023)   Overall Financial Resource Strain (CARDIA)    Difficulty of Paying Living Expenses: Not hard at all  Food Insecurity: No Food Insecurity (02/24/2023)   Hunger Vital Sign    Worried About Running Out of Food in the Last Year: Never true    Ran Out of Food in the Last Year: Never true  Transportation Needs: No Transportation Needs (02/24/2023)   PRAPARE - Administrator, Civil Service (Medical): No    Lack of Transportation (Non-Medical): No  Physical Activity: Insufficiently Active (02/24/2023)   Exercise Vital Sign    Days of Exercise per Week: 2 days    Minutes of Exercise per Session: 20 min  Stress: Stress Concern Present (02/24/2023)   [employer] of Occupational Health - Occupational Stress Questionnaire    Feeling of Stress : Rather much  Social  Connections: Moderately Integrated (02/24/2023)   Social Connection and Isolation Panel [NHANES]    Frequency of Communication with Friends and Family: Three times a week    Frequency of Social Gatherings with Friends and Family: Once a week    Attends Religious Services: 1 to 4 times per year    Active Member of [employer] or Organizations: No    Attends Banker Meetings: Never    Marital Status: Living with partner  Recent Concern: Social Connections - Moderately Isolated (01/27/2023)   Social Connection and Isolation Panel [NHANES]    Frequency of Communication with Friends and Family: More than three times a week    Frequency of Social Gatherings with Friends and Family: Twice a week    Attends Religious Services: Never    Database Administrator or Organizations: Yes    Attends Banker Meetings: Never    Marital Status: Divorced  Catering Manager Violence: Not At Risk (01/27/2023)   Humiliation, Afraid, Rape, and Kick questionnaire    Fear of Current or Ex-Partner: No    Emotionally Abused: No    Physically Abused: No    Sexually Abused: No    Family History  Problem Relation Age of Onset   Heart attack Father 65   Hyperlipidemia Father    Hypertension Father    Sudden death Father    Stroke Mother    Hyperlipidemia Mother    Hypertension Mother    Heart disease Mother    Obesity Mother    Heart attack Paternal Grandfather    Heart disease Sister    BRCA 1/2 Neg Hx    Breast cancer Neg Hx    Esophageal cancer Neg Hx    Colon cancer Neg Hx    Pancreatic cancer Neg Hx    Liver disease Neg Hx    Stomach cancer Neg Hx      Review of Systems  Constitutional: Negative.  Negative  for chills and fever.  HENT: Negative.  Negative for congestion and sore throat.   Respiratory: Negative.  Negative for cough and shortness of breath.   Cardiovascular: Negative.  Negative for chest pain and palpitations.  Gastrointestinal:  Negative for abdominal pain,  diarrhea, nausea and vomiting.  Genitourinary:  Negative for dysuria.  Skin: Negative.   Neurological: Negative.  Negative for dizziness and headaches.  Psychiatric/Behavioral:  The patient is nervous/anxious.   All other systems reviewed and are negative.   Vitals:   02/25/23 1505  BP: 132/64  Pulse: (!) 59  Temp: 97.9 F (36.6 C)  SpO2: 96%    Physical Exam Vitals reviewed.  Constitutional:      Appearance: Normal appearance.  HENT:     Head: Normocephalic.     Mouth/Throat:     Mouth: Mucous membranes are moist.     Pharynx: Oropharynx is clear.  Eyes:     Extraocular Movements: Extraocular movements intact.     Pupils: Pupils are equal, round, and reactive to light.  Cardiovascular:     Rate and Rhythm: Normal rate and regular rhythm.     Pulses: Normal pulses.     Heart sounds: Normal heart sounds.  Pulmonary:     Effort: Pulmonary effort is normal.     Breath sounds: Normal breath sounds.  Musculoskeletal:     Cervical back: No tenderness.  Lymphadenopathy:     Cervical: No cervical adenopathy.  Skin:    General: Skin is warm and dry.     Capillary Refill: Capillary refill takes less than 2 seconds.  Neurological:     Mental Status: She is alert and oriented to person, place, and time.  Psychiatric:        Mood and Affect: Mood normal.        Behavior: Behavior normal.      ASSESSMENT & PLAN: A total of 42 minutes was spent with the patient and counseling/coordination of care regarding preparing for this visit, review of most recent office visit notes, review of multiple chronic medical conditions and their management, cardiovascular risks associated with hypertension, review of all medications, review of most recent bloodwork results, review of health maintenance items, education on nutrition, prognosis, documentation, and need for follow up.   Problem List Items Addressed This Visit       Cardiovascular and Mediastinum   Essential hypertension -  Primary   Well-controlled hypertension with normal blood pressure readings at home Recommend to continue amlodipine  5 mg daily, metoprolol  tartrate 25 mg twice a day, and Altace  10 mg daily Cardiovascular risks associated with hypertension discussed Diet and nutrition discussed Situational anxiety contributing to hypertension episodes Stress management discussed        Other   Dyslipidemia   Chronic stable condition Continue Zetia  10 mg daily and rosuvastatin  40 mg daily      Adjustment reaction with anxiety and depression   Much difficulty with daughter who has herself new problems with DUI and broken ankle living by herself and not working  Taking Xanax  as needed but making her sleepy      Patient Instructions  Hypertension, Adult High blood pressure (hypertension) is when the force of blood pumping through the arteries is too strong. The arteries are the blood vessels that carry blood from the heart throughout the body. Hypertension forces the heart to work harder to pump blood and may cause arteries to become narrow or stiff. Untreated or uncontrolled hypertension can lead to a heart attack, heart  failure, a stroke, kidney disease, and other problems. A blood pressure reading consists of a higher number over a lower number. Ideally, your blood pressure should be below 120/80. The first (top) number is called the systolic pressure. It is a measure of the pressure in your arteries as your heart beats. The second (bottom) number is called the diastolic pressure. It is a measure of the pressure in your arteries as the heart relaxes. What are the causes? The exact cause of this condition is not known. There are some conditions that result in high blood pressure. What increases the risk? Certain factors may make you more likely to develop high blood pressure. Some of these risk factors are under your control, including: Smoking. Not getting enough exercise or physical  activity. Being overweight. Having too much fat, sugar, calories, or salt (sodium) in your diet. Drinking too much alcohol. Other risk factors include: Having a personal history of heart disease, diabetes, high cholesterol, or kidney disease. Stress. Having a family history of high blood pressure and high cholesterol. Having obstructive sleep apnea. Age. The risk increases with age. What are the signs or symptoms? High blood pressure may not cause symptoms. Very high blood pressure (hypertensive crisis) may cause: Headache. Fast or irregular heartbeats (palpitations). Shortness of breath. Nosebleed. Nausea and vomiting. Vision changes. Severe chest pain, dizziness, and seizures. How is this diagnosed? This condition is diagnosed by measuring your blood pressure while you are seated, with your arm resting on a flat surface, your legs uncrossed, and your feet flat on the floor. The cuff of the blood pressure monitor will be placed directly against the skin of your upper arm at the level of your heart. Blood pressure should be measured at least twice using the same arm. Certain conditions can cause a difference in blood pressure between your right and left arms. If you have a high blood pressure reading during one visit or you have normal blood pressure with other risk factors, you may be asked to: Return on a different day to have your blood pressure checked again. Monitor your blood pressure at home for 1 week or longer. If you are diagnosed with hypertension, you may have other blood or imaging tests to help your health care provider understand your overall risk for other conditions. How is this treated? This condition is treated by making healthy lifestyle changes, such as eating healthy foods, exercising more, and reducing your alcohol intake. You may be referred for counseling on a healthy diet and physical activity. Your health care provider may prescribe medicine if lifestyle changes  are not enough to get your blood pressure under control and if: Your systolic blood pressure is above 130. Your diastolic blood pressure is above 80. Your personal target blood pressure may vary depending on your medical conditions, your age, and other factors. Follow these instructions at home: Eating and drinking  Eat a diet that is high in fiber and potassium, and low in sodium, added sugar, and fat. An example of this eating plan is called the DASH diet. DASH stands for Dietary Approaches to Stop Hypertension. To eat this way: Eat plenty of fresh fruits and vegetables. Try to fill one half of your plate at each meal with fruits and vegetables. Eat whole grains, such as whole-wheat pasta, brown rice, or whole-grain bread. Fill about one fourth of your plate with whole grains. Eat or drink low-fat dairy products, such as skim milk or low-fat yogurt. Avoid fatty cuts of meat, processed or  cured meats, and poultry with skin. Fill about one fourth of your plate with lean proteins, such as fish, chicken without skin, beans, eggs, or tofu. Avoid pre-made and processed foods. These tend to be higher in sodium, added sugar, and fat. Reduce your daily sodium intake. Many people with hypertension should eat less than 1,500 mg of sodium a day. Do not drink alcohol if: Your health care provider tells you not to drink. You are pregnant, may be pregnant, or are planning to become pregnant. If you drink alcohol: Limit how much you have to: 0-1 drink a day for women. 0-2 drinks a day for men. Know how much alcohol is in your drink. In the [place] one drink equals one 12 oz bottle of beer (355 mL), one 5 oz glass of wine (148 mL), or one 1 oz glass of hard liquor (44 mL). Lifestyle  Work with your health care provider to maintain a healthy body weight or to lose weight. Ask what an ideal weight is for you. Get at least 30 minutes of exercise that causes your heart to beat faster (aerobic exercise) most  days of the week. Activities may include walking, swimming, or biking. Include exercise to strengthen your muscles (resistance exercise), such as Pilates or lifting weights, as part of your weekly exercise routine. Try to do these types of exercises for 30 minutes at least 3 days a week. Do not use any products that contain nicotine or tobacco. These products include cigarettes, chewing tobacco, and vaping devices, such as e-cigarettes. If you need help quitting, ask your health care provider. Monitor your blood pressure at home as told by your health care provider. Keep all follow-up visits. This is important. Medicines Take over-the-counter and prescription medicines only as told by your health care provider. Follow directions carefully. Blood pressure medicines must be taken as prescribed. Do not skip doses of blood pressure medicine. Doing this puts you at risk for problems and can make the medicine less effective. Ask your health care provider about side effects or reactions to medicines that you should watch for. Contact a health care provider if you: Think you are having a reaction to a medicine you are taking. Have headaches that keep coming back (recurring). Feel dizzy. Have swelling in your ankles. Have trouble with your vision. Get help right away if you: Develop a severe headache or confusion. Have unusual weakness or numbness. Feel faint. Have severe pain in your chest or abdomen. Vomit repeatedly. Have trouble breathing. These symptoms may be an emergency. Get help right away. Call 911. Do not wait to see if the symptoms will go away. Do not drive yourself to the hospital. Summary Hypertension is when the force of blood pumping through your arteries is too strong. If this condition is not controlled, it may put you at risk for serious complications. Your personal target blood pressure may vary depending on your medical conditions, your age, and other factors. For most people,  a normal blood pressure is less than 120/80. Hypertension is treated with lifestyle changes, medicines, or a combination of both. Lifestyle changes include losing weight, eating a healthy, low-sodium diet, exercising more, and limiting alcohol. This information is not intended to replace advice given to you by your health care provider. Make sure you discuss any questions you have with your health care provider. Document Revised: 12/12/2020 Document Reviewed: 12/12/2020 Elsevier Patient Education  2024 Elsevier Inc.    Emil Schaumann, MD [facility]

## 2023-02-25 NOTE — Assessment & Plan Note (Signed)
 Much difficulty with daughter who has herself new problems with DUI and broken ankle living by herself and not working  Taking Xanax as needed but making her sleepy

## 2023-02-25 NOTE — Patient Instructions (Signed)
 Hypertension, Adult High blood pressure (hypertension) is when the force of blood pumping through the arteries is too strong. The arteries are the blood vessels that carry blood from the heart throughout the body. Hypertension forces the heart to work harder to pump blood and may cause arteries to become narrow or stiff. Untreated or uncontrolled hypertension can lead to a heart attack, heart failure, a stroke, kidney disease, and other problems. A blood pressure reading consists of a higher number over a lower number. Ideally, your blood pressure should be below 120/80. The first ("top") number is called the systolic pressure. It is a measure of the pressure in your arteries as your heart beats. The second ("bottom") number is called the diastolic pressure. It is a measure of the pressure in your arteries as the heart relaxes. What are the causes? The exact cause of this condition is not known. There are some conditions that result in high blood pressure. What increases the risk? Certain factors may make you more likely to develop high blood pressure. Some of these risk factors are under your control, including: Smoking. Not getting enough exercise or physical activity. Being overweight. Having too much fat, sugar, calories, or salt (sodium) in your diet. Drinking too much alcohol. Other risk factors include: Having a personal history of heart disease, diabetes, high cholesterol, or kidney disease. Stress. Having a family history of high blood pressure and high cholesterol. Having obstructive sleep apnea. Age. The risk increases with age. What are the signs or symptoms? High blood pressure may not cause symptoms. Very high blood pressure (hypertensive crisis) may cause: Headache. Fast or irregular heartbeats (palpitations). Shortness of breath. Nosebleed. Nausea and vomiting. Vision changes. Severe chest pain, dizziness, and seizures. How is this diagnosed? This condition is diagnosed by  measuring your blood pressure while you are seated, with your arm resting on a flat surface, your legs uncrossed, and your feet flat on the floor. The cuff of the blood pressure monitor will be placed directly against the skin of your upper arm at the level of your heart. Blood pressure should be measured at least twice using the same arm. Certain conditions can cause a difference in blood pressure between your right and left arms. If you have a high blood pressure reading during one visit or you have normal blood pressure with other risk factors, you may be asked to: Return on a different day to have your blood pressure checked again. Monitor your blood pressure at home for 1 week or longer. If you are diagnosed with hypertension, you may have other blood or imaging tests to help your health care provider understand your overall risk for other conditions. How is this treated? This condition is treated by making healthy lifestyle changes, such as eating healthy foods, exercising more, and reducing your alcohol intake. You may be referred for counseling on a healthy diet and physical activity. Your health care provider may prescribe medicine if lifestyle changes are not enough to get your blood pressure under control and if: Your systolic blood pressure is above 130. Your diastolic blood pressure is above 80. Your personal target blood pressure may vary depending on your medical conditions, your age, and other factors. Follow these instructions at home: Eating and drinking  Eat a diet that is high in fiber and potassium, and low in sodium, added sugar, and fat. An example of this eating plan is called the DASH diet. DASH stands for Dietary Approaches to Stop Hypertension. To eat this way: Eat  plenty of fresh fruits and vegetables. Try to fill one half of your plate at each meal with fruits and vegetables. Eat whole grains, such as whole-wheat pasta, brown rice, or whole-grain bread. Fill about one  fourth of your plate with whole grains. Eat or drink low-fat dairy products, such as skim milk or low-fat yogurt. Avoid fatty cuts of meat, processed or cured meats, and poultry with skin. Fill about one fourth of your plate with lean proteins, such as fish, chicken without skin, beans, eggs, or tofu. Avoid pre-made and processed foods. These tend to be higher in sodium, added sugar, and fat. Reduce your daily sodium intake. Many people with hypertension should eat less than 1,500 mg of sodium a day. Do not drink alcohol if: Your health care provider tells you not to drink. You are pregnant, may be pregnant, or are planning to become pregnant. If you drink alcohol: Limit how much you have to: 0-1 drink a day for women. 0-2 drinks a day for men. Know how much alcohol is in your drink. In the [place] one drink equals one 12 oz bottle of beer (355 mL), one 5 oz glass of wine (148 mL), or one 1 oz glass of hard liquor (44 mL). Lifestyle  Work with your health care provider to maintain a healthy body weight or to lose weight. Ask what an ideal weight is for you. Get at least 30 minutes of exercise that causes your heart to beat faster (aerobic exercise) most days of the week. Activities may include walking, swimming, or biking. Include exercise to strengthen your muscles (resistance exercise), such as Pilates or lifting weights, as part of your weekly exercise routine. Try to do these types of exercises for 30 minutes at least 3 days a week. Do not use any products that contain nicotine or tobacco. These products include cigarettes, chewing tobacco, and vaping devices, such as e-cigarettes. If you need help quitting, ask your health care provider. Monitor your blood pressure at home as told by your health care provider. Keep all follow-up visits. This is important. Medicines Take over-the-counter and prescription medicines only as told by your health care provider. Follow directions carefully. Blood  pressure medicines must be taken as prescribed. Do not skip doses of blood pressure medicine. Doing this puts you at risk for problems and can make the medicine less effective. Ask your health care provider about side effects or reactions to medicines that you should watch for. Contact a health care provider if you: Think you are having a reaction to a medicine you are taking. Have headaches that keep coming back (recurring). Feel dizzy. Have swelling in your ankles. Have trouble with your vision. Get help right away if you: Develop a severe headache or confusion. Have unusual weakness or numbness. Feel faint. Have severe pain in your chest or abdomen. Vomit repeatedly. Have trouble breathing. These symptoms may be an emergency. Get help right away. Call 911. Do not wait to see if the symptoms will go away. Do not drive yourself to the hospital. Summary Hypertension is when the force of blood pumping through your arteries is too strong. If this condition is not controlled, it may put you at risk for serious complications. Your personal target blood pressure may vary depending on your medical conditions, your age, and other factors. For most people, a normal blood pressure is less than 120/80. Hypertension is treated with lifestyle changes, medicines, or a combination of both. Lifestyle changes include losing weight, eating a healthy,  low-sodium diet, exercising more, and limiting alcohol. This information is not intended to replace advice given to you by your health care provider. Make sure you discuss any questions you have with your health care provider. Document Revised: 12/12/2020 Document Reviewed: 12/12/2020 Elsevier Patient Education  2024 [employer].

## 2023-02-25 NOTE — Assessment & Plan Note (Signed)
Chronic stable condition. Continue Zetia 10 mg daily and rosuvastatin 40 mg daily

## 2023-02-26 ENCOUNTER — Ambulatory Visit: Payer: Medicare HMO | Admitting: Emergency Medicine

## 2023-02-28 ENCOUNTER — Other Ambulatory Visit: Payer: Medicare HMO

## 2023-03-06 ENCOUNTER — Other Ambulatory Visit (HOSPITAL_BASED_OUTPATIENT_CLINIC_OR_DEPARTMENT_OTHER): Payer: Self-pay

## 2023-03-10 ENCOUNTER — Encounter: Payer: Self-pay | Admitting: Internal Medicine

## 2023-03-10 ENCOUNTER — Other Ambulatory Visit: Payer: Self-pay | Admitting: Cardiology

## 2023-03-12 ENCOUNTER — Ambulatory Visit (INDEPENDENT_AMBULATORY_CARE_PROVIDER_SITE_OTHER): Payer: Self-pay | Admitting: Family Medicine

## 2023-03-12 ENCOUNTER — Ambulatory Visit (INDEPENDENT_AMBULATORY_CARE_PROVIDER_SITE_OTHER): Payer: Medicare HMO | Admitting: Family Medicine

## 2023-03-17 ENCOUNTER — Encounter: Payer: Self-pay | Admitting: Internal Medicine
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
## 2023-03-24 ENCOUNTER — Telehealth: Payer: Self-pay | Admitting: Internal Medicine

## 2023-03-24 NOTE — Telephone Encounter (Signed)
Patient called and stated and she would like to know weather her insurance would cover her RECALL colonoscopy. Patient is requesting a call back. Please advise.

## 2023-03-25 ENCOUNTER — Encounter: Payer: Self-pay | Admitting: Cardiology

## 2023-03-25 ENCOUNTER — Ambulatory Visit: Payer: PPO | Attending: Cardiology | Admitting: Cardiology

## 2023-03-25 VITALS — BP 120/58 | HR 49 | Ht 60.0 in | Wt 165.2 lb

## 2023-03-25 DIAGNOSIS — I10 Essential (primary) hypertension: Secondary | ICD-10-CM

## 2023-03-25 DIAGNOSIS — I25110 Atherosclerotic heart disease of native coronary artery with unstable angina pectoris: Secondary | ICD-10-CM

## 2023-03-25 DIAGNOSIS — I2510 Atherosclerotic heart disease of native coronary artery without angina pectoris: Secondary | ICD-10-CM

## 2023-03-25 LAB — CBC WITH DIFFERENTIAL/PLATELET

## 2023-03-25 MED ORDER — NITROGLYCERIN 0.4 MG SL SUBL
0.4000 mg | SUBLINGUAL_TABLET | SUBLINGUAL | 11 refills | Status: AC | PRN
Start: 2023-03-25 — End: ?

## 2023-03-25 NOTE — Patient Instructions (Addendum)
 Medication Instructions:  Continue same medications   Lab Work: Cbc,cmet,lipid panel today   Testing/Procedures: Cardiac Pet Scan will be scheduled at [facility] after approved by insurance   Follow directions below   Follow-Up: At [facility], you and your health needs are our priority.  As part of our continuing mission to provide you with exceptional heart care, we have created designated Provider Care Teams.  These Care Teams include your primary Cardiologist (physician) and Advanced Practice Providers (APPs -  Physician Assistants and Nurse Practitioners) who all work together to provide you with the care you need, when you need it.  We recommend signing up for the patient portal called MyChart.  Sign up information is provided on this After Visit Summary.  MyChart is used to connect with patients for Virtual Visits (Telemedicine).  Patients are able to view lab/test results, encounter notes, upcoming appointments, etc.  Non-urgent messages can be sent to your provider as well.   To learn more about what you can do with MyChart, go to [email or web address].    Your next appointment:  To Be Determined     Provider:  Dr.Jordan           Please report to Radiology at the [facility] Main Entrance 30 minutes early for your test.  [address] [place], [place] [address]                         OR   Please report to Radiology at [facility] Main Entrance, medical mall, 30 mins prior to your test.  [address]  [place], [place]  How to Prepare for Your Cardiac PET/CT Stress Test:  Nothing to eat or drink, except water, 3 hours prior to arrival time.  NO caffeine /decaffeinated products, or chocolate 12 hours prior to arrival. (Please note decaffeinated beverages (teas/coffees) still contain caffeine ).  If you have caffeine  within 12 hours prior, the test will need to be rescheduled.  Medication instructions: Do  not take nitrates (isosorbide mononitrate, Ranexa) the day before or day of test Do not take tamsulosin the day before or morning of test Hold theophylline containing medications for 12 hours. Hold Dipyridamole 48 hours prior to the test.   You may take your remaining medications with water.  NO perfume, cologne or lotion on chest or abdomen area. FEMALES - Please avoid wearing dresses to this appointment.  Total time is 1 to 2 hours; you may want to bring reading material for the waiting time.  IF YOU THINK YOU MAY BE PREGNANT, OR ARE NURSING PLEASE INFORM THE TECHNOLOGIST.  In preparation for your appointment, medication and supplies will be purchased.  Appointment availability is limited, so if you need to cancel or reschedule, please call the Radiology Department Scheduler at [phone] 24 hours in advance to avoid a cancellation fee of $100.00  What to Expect When you Arrive:  Once you arrive and check in for your appointment, you will be taken to a preparation room within the Radiology Department.  A technologist or Nurse will obtain your medical history, verify that you are correctly prepped for the exam, and explain the procedure.  Afterwards, an IV will be started in your arm and electrodes will be placed on your skin for EKG monitoring during the stress portion of the exam. Then you will be escorted to the PET/CT scanner.  There, staff will get you positioned on the scanner and obtain  a blood pressure and EKG.  During the exam, you will continue to be connected to the EKG and blood pressure machines.  A small, safe amount of a radioactive tracer will be injected in your IV to obtain a series of pictures of your heart along with an injection of a stress agent.    After your Exam:  It is recommended that you eat a meal and drink a caffeinated beverage to counter act any effects of the stress agent.  Drink plenty of fluids for the remainder of the day and urinate frequently for the  first couple of hours after the exam.  Your doctor will inform you of your test results within 7-10 business days.  For more information and frequently asked questions, please visit our website: [email or web address]  For questions about your test or how to prepare for your test, please call: Cardiac Imaging Nurse Navigators Office: [phone]

## 2023-03-26 ENCOUNTER — Encounter (INDEPENDENT_AMBULATORY_CARE_PROVIDER_SITE_OTHER): Payer: Self-pay | Admitting: Family Medicine

## 2023-03-26 ENCOUNTER — Ambulatory Visit (INDEPENDENT_AMBULATORY_CARE_PROVIDER_SITE_OTHER): Payer: PPO | Admitting: Family Medicine

## 2023-03-26 VITALS — BP 132/64 | HR 55 | Temp 97.4°F | Ht 60.0 in | Wt 161.0 lb

## 2023-03-26 DIAGNOSIS — F419 Anxiety disorder, unspecified: Secondary | ICD-10-CM | POA: Diagnosis not present

## 2023-03-26 DIAGNOSIS — E669 Obesity, unspecified: Secondary | ICD-10-CM | POA: Diagnosis not present

## 2023-03-26 DIAGNOSIS — Z6831 Body mass index (BMI) 31.0-31.9, adult: Secondary | ICD-10-CM | POA: Diagnosis not present

## 2023-03-26 DIAGNOSIS — Z639 Problem related to primary support group, unspecified: Secondary | ICD-10-CM

## 2023-03-26 DIAGNOSIS — I10 Essential (primary) hypertension: Secondary | ICD-10-CM | POA: Diagnosis not present

## 2023-03-26 DIAGNOSIS — R7303 Prediabetes: Secondary | ICD-10-CM | POA: Diagnosis not present

## 2023-03-26 LAB — COMPREHENSIVE METABOLIC PANEL
ALT: 14 [IU]/L (ref 0–32)
AST: 19 [IU]/L (ref 0–40)
Albumin: 4.2 g/dL (ref 3.8–4.8)
Alkaline Phosphatase: 73 [IU]/L (ref 44–121)
BUN/Creatinine Ratio: 27 (ref 12–28)
BUN: 24 mg/dL (ref 8–27)
Bilirubin Total: 0.3 mg/dL (ref 0.0–1.2)
CO2: 22 mmol/L (ref 20–29)
Calcium: 9.4 mg/dL (ref 8.7–10.3)
Chloride: 103 mmol/L (ref 96–106)
Creatinine, Ser: 0.88 mg/dL (ref 0.57–1.00)
Globulin, Total: 2.1 g/dL (ref 1.5–4.5)
Glucose: 93 mg/dL (ref 70–99)
Potassium: 5 mmol/L (ref 3.5–5.2)
Sodium: 139 mmol/L (ref 134–144)
Total Protein: 6.3 g/dL (ref 6.0–8.5)
eGFR: 69 mL/min/{1.73_m2} (ref >59)

## 2023-03-26 LAB — CBC WITH DIFFERENTIAL/PLATELET
Basos: 1 %
EOS (ABSOLUTE): 0.1 10*3/uL (ref 0.0–0.2)
Eos: 3 %
Hematocrit: 36.6 % (ref 34.0–46.6)
Hemoglobin: 12.4 g/dL (ref 11.1–15.9)
Immature Granulocytes: 0 %
Immature Granulocytes: 0 10*3/uL (ref 0.0–0.1)
Lymphs: 34 %
MCH: 31.6 pg (ref 26.6–33.0)
MCHC: 33.9 g/dL (ref 31.5–35.7)
MCV: 93 fL (ref 79–97)
Monocytes Absolute: 0.2 10*3/uL (ref 0.0–0.4)
Monocytes Absolute: 0.6 10*3/uL (ref 0.1–0.9)
Monocytes: 8 %
Neutrophils Absolute: 2.4 10*3/uL (ref 0.7–3.1)
Neutrophils Absolute: 3.9 10*3/uL (ref 1.4–7.0)
Neutrophils: 54 %
Platelets: 227 10*3/uL (ref 150–450)
RBC: 3.93 x10E6/uL (ref 3.77–5.28)
RDW: 12.4 % (ref 11.7–15.4)
WBC: 7.3 10*3/uL (ref 3.4–10.8)

## 2023-03-26 LAB — LIPID PANEL
Chol/HDL Ratio: 1.7 {ratio} (ref 0.0–4.4)
Cholesterol, Total: 133 mg/dL (ref 100–199)
HDL: 77 mg/dL (ref >39)
LDL Chol Calc (NIH): 41 mg/dL (ref 0–99)
Triglycerides: 77 mg/dL (ref 0–149)
VLDL Cholesterol Cal: 15 mg/dL (ref 5–40)

## 2023-03-26 NOTE — Progress Notes (Signed)
 .smr  Office: [phone]  /  Fax: [phone]  WEIGHT SUMMARY AND BIOMETRICS  Anthropometric Measurements Height: 5' (1.524 m) Weight: 161 lb (73 kg) BMI (Calculated): 31.44 Weight at Last Visit: 160 lb Weight Lost Since Last Visit: 0 Weight Gained Since Last Visit: 1 lb Starting Weight: 157 lb Total Weight Loss (lbs): 0 lb (0 kg) Peak Weight: 167 lb   Body Composition  Body Fat %: 43.5 % Fat Mass (lbs): 70.2 lbs Muscle Mass (lbs): 86.6 lbs Total Body Water (lbs): 61.4 lbs Visceral Fat Rating : 13   Other Clinical Data Fasting: No Labs: Yes Today's Visit #: 30 Starting Date: 07/10/22    Chief Complaint: OBESITY    History of Present Illness   Courtney [name] is a 73 year old female who presents to discuss her weight management.  Over the last two months, she has gained one pound and adheres to her category one plan about fifty percent of the time. She has not been exercising and reports increased stress due to family issues, impacting her ability to prepare meals and leading to more frequent takeout consumption. She uses protein shakes as a dietary supplement.  She experiences elevated blood pressure readings, initially at 147/63 mmHg, improving to 132/64 mmHg upon repeat measurement. She describes 'horrible pressure' in her chest, which she alleviates with chamomile tea and relaxation techniques. Her current medications for blood pressure management include metoprolol  and Norvasc , with the latter causing tightness in her ankles. She occasionally takes Xanax  for anxiety, which results in significant sedation.  She is under significant stress related to her daughter's legal and personal issues, including a DWI and an ankle injury, leading to financial and emotional strain. Her daughter is unable to work and requires full financial support, contributing to her stress and affecting her lifestyle, including her diet and exercise routine.  She has a history of prediabetes  and is considering restarting metformin . Her LDL cholesterol has increased slightly despite being on Repatha , but it remains at 41 mg/dL. She regularly monitors her blood pressure but has stopped weighing herself due to stress.          PHYSICAL EXAM:  Blood pressure 132/64, pulse (!) 55, temperature (!) 97.4 F (36.3 C), height 5' (1.524 m), weight 161 lb (73 kg). Body mass index is 31.44 kg/m.  DIAGNOSTIC DATA REVIEWED:  BMET    Component Value Date/Time   NA 139 03/25/2023 1100   K 5.0 03/25/2023 1100   CL 103 03/25/2023 1100   CO2 22 03/25/2023 1100   GLUCOSE 93 03/25/2023 1100   GLUCOSE 92 07/25/2022 1442   BUN 24 03/25/2023 1100   CREATININE 0.88 03/25/2023 1100   CALCIUM  9.4 03/25/2023 1100   GFRNONAA 55 (L) 03/30/2020 1030   GFRAA 63 03/30/2020 1030   Lab Results  Component Value Date   HGBA1C 5.8 (H) 02/26/2022   HGBA1C 5.6 06/16/2017   Lab Results  Component Value Date   INSULIN  10.2 02/26/2022   INSULIN  6.4 06/16/2017   Lab Results  Component Value Date   TSH 1.020 02/26/2022   CBC    Component Value Date/Time   WBC 7.3 03/25/2023 1100   WBC 8.0 07/25/2022 1442   RBC 3.93 03/25/2023 1100   RBC 3.97 07/25/2022 1442   HGB 12.4 03/25/2023 1100   HCT 36.6 03/25/2023 1100   PLT 227 03/25/2023 1100   MCV 93 03/25/2023 1100   MCH 31.6 03/25/2023 1100   MCH 31.8 10/14/2017 1923   MCHC 33.9  03/25/2023 1100   MCHC 33.4 07/25/2022 1442   RDW 12.4 03/25/2023 1100   Iron Studies    Component Value Date/Time   IRON 117 10/17/2020 1013   IRON 100 01/25/2019 0853   TIBC 431.2 10/17/2020 1013   TIBC 328 01/25/2019 0853   FERRITIN 133.2 10/17/2020 1013   FERRITIN 54 01/25/2019 0853   IRONPCTSAT 27.1 10/17/2020 1013   IRONPCTSAT 30 01/25/2019 0853   Lipid Panel     Component Value Date/Time   CHOL 133 03/25/2023 1100   TRIG 77 03/25/2023 1100   HDL 77 03/25/2023 1100   CHOLHDL 1.7 03/25/2023 1100   CHOLHDL 3 11/30/2015 1028   VLDL 25.4  11/30/2015 1028   LDLCALC 41 03/25/2023 1100   Hepatic Function Panel     Component Value Date/Time   PROT 6.3 03/25/2023 1100   ALBUMIN  4.2 03/25/2023 1100   AST 19 03/25/2023 1100   ALT 14 03/25/2023 1100   ALKPHOS 73 03/25/2023 1100   BILITOT 0.3 03/25/2023 1100   BILIDIR 0.13 05/15/2021 1000      Component Value Date/Time   TSH 1.020 02/26/2022 1046   Nutritional Lab Results  Component Value Date   VD25OH 90.1 10/15/2022   VD25OH 83.1 02/26/2022   VD25OH 63.8 10/18/2021     Assessment and Plan    Obesity Gained one pound in the last two months. Following category one plan about 50% of the time and not exercising. Significant stress due to daughter's situation likely contributing to weight gain. Discussed importance of strict adherence to category one plan and considering grab-and-go options to manage diet. Restarting metformin  to help manage insulin  levels and weight. - Encourage strict adherence to the category one plan - Consider grab-and-go meal options - Restart metformin  500 mg once daily with food  Prediabetes Last hemoglobin A1c was 5.8%. Explained that stress can increase insulin  levels, making it harder to manage weight and blood sugar levels. Restarting metformin  expected to help manage insulin  levels. - Check hemoglobin A1c today - Restart metformin  500 mg once daily with food  Hypertension Blood pressure elevated at 147/63, improved to 132/64 on repeat measurement. Currently on metoprolol  and Norvasc , which causes ankle tightness. Emphasized importance of managing blood pressure to prevent cardiovascular events. Discussed that tight ankles are preferable to a heart attack. - Continue metoprolol  and Norvasc  - Monitor blood pressure regularly  Anxiety Significant stress and anxiety related to daughter's situation. Experiences chest tightness, alleviated with chamomile tea and relaxation techniques. Finds Xanax  too sedating. Discussed alternative therapies  for anxiety management. - Encourage relaxation techniques such as chamomile tea and meditation - Consider alternative therapies for anxiety management  General Health Maintenance Advised to focus on positive aspects of life to improve mental health. Recommended avoiding stress-inducing news and focusing on activities that bring joy. - Encourage focusing on positive aspects of life - Avoid stressful news and media  Follow-up - Follow up in four weeks.       She was informed of the importance of frequent follow up visits to maximize her success with intensive lifestyle modifications for her multiple health conditions.    Louann Penton, MD

## 2023-03-27 ENCOUNTER — Encounter (INDEPENDENT_AMBULATORY_CARE_PROVIDER_SITE_OTHER): Payer: Self-pay | Admitting: Family Medicine

## 2023-03-27 DIAGNOSIS — E66811 Obesity, class 1: Secondary | ICD-10-CM

## 2023-03-27 LAB — HEMOGLOBIN A1C
Est. average glucose Bld gHb Est-mCnc: 120 mg/dL
Hgb A1c MFr Bld: 5.8 % — ABNORMAL HIGH (ref 4.8–5.6)

## 2023-03-31 MED ORDER — METFORMIN HCL 500 MG PO TABS
500.0000 mg | ORAL_TABLET | Freq: Every day | ORAL | 0 refills | Status: DC
Start: 2023-03-31 — End: 2023-05-27

## 2023-03-31 NOTE — Telephone Encounter (Signed)
 Ok, please send in Rx x 1 month

## 2023-04-03 ENCOUNTER — Other Ambulatory Visit: Payer: Self-pay | Admitting: Cardiology

## 2023-04-15 ENCOUNTER — Other Ambulatory Visit: Payer: Self-pay | Admitting: Emergency Medicine

## 2023-04-21 ENCOUNTER — Other Ambulatory Visit: Payer: Self-pay | Admitting: Obstetrics and Gynecology

## 2023-04-21 DIAGNOSIS — Z1231 Encounter for screening mammogram for malignant neoplasm of breast: Secondary | ICD-10-CM

## 2023-04-22 ENCOUNTER — Ambulatory Visit (INDEPENDENT_AMBULATORY_CARE_PROVIDER_SITE_OTHER): Payer: Medicare HMO | Admitting: Emergency Medicine

## 2023-04-22 ENCOUNTER — Encounter: Payer: Self-pay | Admitting: Emergency Medicine

## 2023-04-22 VITALS — BP 124/64 | HR 51 | Temp 98.2°F | Ht 60.0 in | Wt 165.0 lb

## 2023-04-22 DIAGNOSIS — I2510 Atherosclerotic heart disease of native coronary artery without angina pectoris: Secondary | ICD-10-CM | POA: Diagnosis not present

## 2023-04-22 DIAGNOSIS — F4323 Adjustment disorder with mixed anxiety and depressed mood: Secondary | ICD-10-CM

## 2023-04-22 DIAGNOSIS — I10 Essential (primary) hypertension: Secondary | ICD-10-CM | POA: Diagnosis not present

## 2023-04-22 DIAGNOSIS — E785 Hyperlipidemia, unspecified: Secondary | ICD-10-CM

## 2023-04-22 DIAGNOSIS — I358 Other nonrheumatic aortic valve disorders: Secondary | ICD-10-CM

## 2023-04-22 DIAGNOSIS — K2100 Gastro-esophageal reflux disease with esophagitis, without bleeding: Secondary | ICD-10-CM

## 2023-04-22 NOTE — Progress Notes (Signed)
 Courtney [name] 73 y.o.   Chief Complaint  Patient presents with   Follow-up    6 month f/u. No other concerns. She mentioned her health and wellness dr told her to start metformin and she did for a week and gave her an upset stomach, abdominal pain and diarrhea so she stop     HISTORY OF PRESENT ILLNESS: This is a 73 y.o. female here for 58-month follow-up of chronic medical conditions. Was recently started on metformin but did not tolerate GI side effects No other complaints or medical concerns today. Wt Readings from Last 3 Encounters:  04/22/23 165 lb (74.8 kg)  03/26/23 161 lb (73 kg)  03/25/23 165 lb 3.2 oz (74.9 kg)  Recent cardiologist office visit on 03/25/2023.  Assessment and plan as follows: ASSESSMENT:     1. Coronary artery disease involving native coronary artery of native heart with unstable angina pectoris (HCC)   2. Essential hypertension   3. Coronary artery disease involving native coronary artery of native heart without angina pectoris           PLAN:     In order of problems listed above:   CAD s/p BMS-RCA 2009 - now with symptom of chest pain with stress concerning for angina - on optimal medical therapy with metoprolol and amlodipine - on ASA  - will arrange for PET CT stress. If abnormal will need to consider cardiac cath - Ntg renewed     2. Hypertension - well controlled now on Altace, metoprolol, amlodipine.      3. Hyperlipidemia with LDL goal < 70 On Zetia, Crestor, Repatha - will update labs today     4. Mild aortic stenosis.  - asymptomatic     Follow up in one year.     HPI   Prior to Admission medications   Medication Sig Start Date End Date Taking? Authorizing Provider  ALPRAZolam (XANAX) 0.25 MG tablet Take 1 tablet (0.25 mg total) by mouth 2 (two) times daily as needed for anxiety. 02/17/23  Yes Corwin Levins, MD  amLODipine (NORVASC) 5 MG tablet Take 1 tablet (5 mg total) by mouth daily. 02/17/23 02/17/24 Yes Corwin Levins, MD  aspirin 81 MG tablet Take 81 mg by mouth daily.   Yes [provider]  clobetasol cream (TEMOVATE) 0.05 % Apply 1-2 times a day to poison oak for 2 weeks 07/02/21  Yes Janalyn Harder, MD  Evolocumab (REPATHA SURECLICK) 140 MG/ML SOAJ Inject 140 mg into the skin every 14 (fourteen) days. 12/04/22  Yes [place], Peter M, MD  gabapentin (NEURONTIN) 300 MG capsule Take 1 capsule (300 mg total) by mouth at bedtime. 04/17/22  Yes Saje Gallop, Eilleen Kempf, MD  metoprolol tartrate (LOPRESSOR) 25 MG tablet Take 1 tablet (25 mg total) by mouth 2 (two) times daily. 02/17/23  Yes Corwin Levins, MD  nitroGLYCERIN (NITROSTAT) 0.4 MG SL tablet Place 1 tablet (0.4 mg total) under the tongue every 5 (five) minutes as needed for chest pain. 03/25/23  Yes [place], Peter M, MD  pantoprazole (PROTONIX) 40 MG tablet TAKE 1 TABLET BY MOUTH EVERY DAY 04/15/23  Yes Georgina Quint, MD  ramipril (ALTACE) 10 MG capsule TAKE 1 CAPSULE BY MOUTH EVERY DAY 04/03/23  Yes [place], Peter M, MD  rosuvastatin (CRESTOR) 40 MG tablet Take 1 tablet (40 mg total) by mouth daily. 03/29/22  Yes [place], Peter M, MD  ezetimibe (ZETIA) 10 MG tablet Take 1 tablet (10 mg total) by mouth daily. 03/29/22  03/24/23  [place], Peter M, MD  metFORMIN (GLUCOPHAGE) 500 MG tablet Take 1 tablet (500 mg total) by mouth daily with breakfast. Patient not taking: Reported on 04/22/2023 03/31/23 04/30/23  Wilder Glade, MD    Allergies  Allergen Reactions   Erythromycin Hives   Penicillins Hives    Has patient had a PCN reaction causing immediate rash, facial/tongue/throat swelling, SOB or lightheadedness with hypotension: No Has patient had a PCN reaction causing severe rash involving mucus membranes or skin necrosis: No Has patient had a PCN reaction that required hospitalization: No Has patient had a PCN reaction occurring within the last 10 years: No If all of the above answers are "NO", then may proceed with Cephalosporin use.    Shellfish Allergy  Hives   Doxycycline Rash    Patient Active Problem List   Diagnosis Date Noted   Adjustment reaction with anxiety and depression 10/16/2022   Mixed hyperlipidemia 09/17/2022   BMI 31.0-31.9,adult 03/26/2022   Obesity, Beginning BMI 30.66 03/26/2022   Insulin resistance 02/26/2022   Chronic pain of right knee 10/15/2021   Stress 08/30/2021   Pre-diabetes 07/11/2021   Degenerative disc disease, lumbar 05/19/2020   Restless leg syndrome 05/15/2020   Degenerative disc disease, cervical 02/02/2020   Polyarthralgia 01/03/2020   Patellofemoral arthritis of right knee 06/16/2019   Vitamin D deficiency, Vit D = 43.2 (01/25/19), Rx vit D 50K IU every 2 weeks 08/04/2018   Cyst of right breast 05/07/2018   Chronic headache disorder 02/08/2018   Essential hypertension 11/17/2017   Class 1 obesity with serious comorbidity and body mass index (BMI) of 30.0 to 30.9 in adult 07/15/2017   Insomnia 07/15/2017   Female stress incontinence 07/15/2017   Anxiety 07/15/2017   Gastroesophageal reflux disease with esophagitis, Rx Protonix 02/25/2017   Osteopenia 06/27/2015   Osteoarthritis of right hip 07/08/2014   Carotid artery disease (HCC)    Aortic valve sclerosis    Dyslipidemia    Coronary artery disease     Past Medical History:  Diagnosis Date   Aortic valve sclerosis    echo 12/2008, soft systolic murmur   Arthritis    in right hip   Back pain    Basal cell carcinoma 05/31/2016   bcc left forehead  tx exc   Carotid artery disease (HCC)    40-59% bilateral, doppler December 2010   Cataract    Constipation    Coronary artery disease    a. BMS-mid RCA 11/2007 b. stress echo 02/2009 subtle inferior HK, lateral ST depressions with stress c. follow-up cath 02/2009: RCA stent patent, severe but stable stenosis of distal posterior lateral branch RCA. LV normal, med tx unless more sx c. ETT Myoview 08/27/12: negative for ischemia; EF 64%   Decreased hearing    Depression    Mild, August,  2012   Dyslipidemia    Easy bruising    Ejection fraction    EF normal, catheterization, 2011 /  EF 55%, echo, 2010   Fluid overload    Mild, stable since hospitalization in the past   GERD (gastroesophageal reflux disease)    occarionally, takes Pepcid over the counter   Heart murmur    has, occasional PVC's   History of right hip replacement    Hyperlipidemia    Hypertension    Joint pain    Myocardial infarction (HCC)    2009   Palpitations    PVC (premature ventricular contraction)    per prior Holter monitor   Right  hip pain 12/2009   Shellfish allergy    Stress fracture    Left foot   Trouble in sleeping    Vitamin B12 deficiency    Vitamin D deficiency     Past Surgical History:  Procedure Laterality Date   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/01/2005   BREAST BIOPSY Left    carcinoid tumor removal     colon-   CARDIAC CATHETERIZATION  1/211   Patent RCA stent, stenotic PLB supplying small distribution; medically managed   CATARACT EXTRACTION, BILATERAL     CORONARY ANGIOPLASTY WITH STENT PLACEMENT  11/2007   BMS-mid RCA   DIAGNOSTIC LAPAROSCOPY     1982 for endometriosis   DILATION AND CURETTAGE OF UTERUS  2005   SKIN CANCER EXCISION     TOTAL HIP ARTHROPLASTY Right 07/08/2014   Procedure: RIGHT TOTAL HIP ARTHROPLASTY ANTERIOR APPROACH;  Surgeon: Kathryne Hitch, MD;  Location: [facility];  Service: Orthopedics;  Laterality: Right;   WISDOM TOOTH EXTRACTION      Social History   Socioeconomic History   Marital status: Divorced    Spouse name: Not on file   Number of children: 1   Years of education: 55   Highest education level: Master's degree (e.g., MA, MS, MEng, MEd, MSW, MBA)  Occupational History   Occupation: Retired - Publishing copy  Tobacco Use   Smoking status: Never   Smokeless tobacco: Never  Vaping Use   Vaping status: Never Used  Substance and Sexual Activity   Alcohol use: Yes    Comment: 1 glass of wine 3x/week   Drug use: No   Sexual  activity: Not Currently    Partners: Male    Comment: divorced  Other Topics Concern   Not on file  Social History Narrative   Right handed   Soda- sometimes   Drinks herbal tea   Fun: Dance, garden,    Denies religious beliefs effecting health care.   Denies abuse and feels safe at home   Social Drivers of Health   Financial Resource Strain: Low Risk  (02/24/2023)   Overall Financial Resource Strain (CARDIA)    Difficulty of Paying Living Expenses: Not hard at all  Food Insecurity: No Food Insecurity (02/24/2023)   Hunger Vital Sign    Worried About Running Out of Food in the Last Year: Never true    Ran Out of Food in the Last Year: Never true  Transportation Needs: No Transportation Needs (02/24/2023)   PRAPARE - Administrator, Civil Service (Medical): No    Lack of Transportation (Non-Medical): No  Physical Activity: Insufficiently Active (02/24/2023)   Exercise Vital Sign    Days of Exercise per Week: 2 days    Minutes of Exercise per Session: 20 min  Stress: Stress Concern Present (02/24/2023)   [employer] of Occupational Health - Occupational Stress Questionnaire    Feeling of Stress : Rather much  Social Connections: Moderately Integrated (02/24/2023)   Social Connection and Isolation Panel [NHANES]    Frequency of Communication with Friends and Family: Three times a week    Frequency of Social Gatherings with Friends and Family: Once a week    Attends Religious Services: 1 to 4 times per year    Active Member of [employer] or Organizations: No    Attends Banker Meetings: Never    Marital Status: Living with partner  Recent Concern: Social Connections - Moderately Isolated (01/27/2023)   Social Connection and Isolation Panel [NHANES]  Frequency of Communication with Friends and Family: More than three times a week    Frequency of Social Gatherings with Friends and Family: Twice a week    Attends Religious Services: Never    Database administrator  or Organizations: Yes    Attends Banker Meetings: Never    Marital Status: Divorced  Catering manager Violence: Not At Risk (01/27/2023)   Humiliation, Afraid, Rape, and Kick questionnaire    Fear of Current or Ex-Partner: No    Emotionally Abused: No    Physically Abused: No    Sexually Abused: No    Family History  Problem Relation Age of Onset   Heart attack Father 63   Hyperlipidemia Father    Hypertension Father    Sudden death Father    Stroke Mother    Hyperlipidemia Mother    Hypertension Mother    Heart disease Mother    Obesity Mother    Heart attack Paternal Grandfather    Heart disease Sister    BRCA 1/2 Neg Hx    Breast cancer Neg Hx    Esophageal cancer Neg Hx    Colon cancer Neg Hx    Pancreatic cancer Neg Hx    Liver disease Neg Hx    Stomach cancer Neg Hx      Review of Systems  Constitutional: Negative.  Negative for chills and fever.  HENT: Negative.  Negative for congestion and sore throat.   Respiratory: Negative.  Negative for cough and shortness of breath.   Cardiovascular:  Positive for chest pain. Negative for palpitations.  Gastrointestinal:  Positive for heartburn. Negative for abdominal pain, diarrhea, nausea and vomiting.  Genitourinary: Negative.  Negative for dysuria and hematuria.  Skin: Negative.  Negative for rash.  Neurological: Negative.  Negative for dizziness and headaches.  All other systems reviewed and are negative.   Vitals:   04/22/23 1001  BP: 124/64  Pulse: (!) 51  Temp: 98.2 F (36.8 C)  SpO2: 97%    Physical Exam Vitals reviewed.  Constitutional:      Appearance: Normal appearance.  HENT:     Head: Normocephalic.     Mouth/Throat:     Mouth: Mucous membranes are moist.     Pharynx: Oropharynx is clear.  Eyes:     Extraocular Movements: Extraocular movements intact.     Conjunctiva/sclera: Conjunctivae normal.     Pupils: Pupils are equal, round, and reactive to light.  Cardiovascular:      Rate and Rhythm: Normal rate and regular rhythm.     Heart sounds: Murmur heard.  Pulmonary:     Effort: Pulmonary effort is normal.     Breath sounds: Normal breath sounds.  Musculoskeletal:     Cervical back: No tenderness.  Lymphadenopathy:     Cervical: No cervical adenopathy.  Skin:    General: Skin is warm and dry.     Capillary Refill: Capillary refill takes less than 2 seconds.  Neurological:     General: No focal deficit present.     Mental Status: She is alert and oriented to person, place, and time.  Psychiatric:        Mood and Affect: Mood normal.        Behavior: Behavior normal.      ASSESSMENT & PLAN: A total of 44 minutes was spent with the patient and counseling/coordination of care regarding preparing for this visit, review of most recent office visit notes, review of most recent cardiologist office visit  notes, review of multiple chronic medical conditions and their management, review of all medications, review of most recent bloodwork results, review of health maintenance items, education on nutrition, prognosis, documentation, and need for follow up.   Problem List Items Addressed This Visit       Cardiovascular and Mediastinum   Aortic valve sclerosis   Stable without hemodynamic compromise. Asymptomatic      Coronary artery disease    now with symptom of chest pain with stress concerning for angina - on optimal medical therapy with metoprolol and amlodipine - on ASA  Scheduled for PET CT stress. If abnormal will need to consider cardiac cath       Essential hypertension - Primary   Well-controlled hypertension with normal blood pressure readings at home Recommend to continue amlodipine 5 mg daily, metoprolol tartrate 25 mg twice a day, and Altace 10 mg daily Cardiovascular risks associated with hypertension discussed Diet and nutrition discussed Situational anxiety contributing to hypertension episodes Stress management discussed         Digestive   Gastroesophageal reflux disease with esophagitis, Rx Protonix   Stable.  Asymptomatic.  Continues pantoprazole 40 mg daily. Also taking Pepcid at bedtime        Other   Dyslipidemia   Chronic stable condition Continue Zetia 10 mg daily and rosuvastatin 40 mg daily Also on Repatha      Adjustment reaction with anxiety and depression   Much difficulty with daughter who has herself new problems with DUI and broken ankle living by herself and not working  Taking Xanax as needed but making her sleepy Stable and handling situation better.      Patient Instructions  Health Maintenance After Age 30 After age 43, you are at a higher risk for certain long-term diseases and infections as well as injuries from falls. Falls are a major cause of broken bones and head injuries in people who are older than age 69. Getting regular preventive care can help to keep you healthy and well. Preventive care includes getting regular testing and making lifestyle changes as recommended by your health care provider. Talk with your health care provider about: Which screenings and tests you should have. A screening is a test that checks for a disease when you have no symptoms. A diet and exercise plan that is right for you. What should I know about screenings and tests to prevent falls? Screening and testing are the best ways to find a health problem early. Early diagnosis and treatment give you the best chance of managing medical conditions that are common after age 35. Certain conditions and lifestyle choices may make you more likely to have a fall. Your health care provider may recommend: Regular vision checks. Poor vision and conditions such as cataracts can make you more likely to have a fall. If you wear glasses, make sure to get your prescription updated if your vision changes. Medicine review. Work with your health care provider to regularly review all of the medicines you are taking, including  over-the-counter medicines. Ask your health care provider about any side effects that may make you more likely to have a fall. Tell your health care provider if any medicines that you take make you feel dizzy or sleepy. Strength and balance checks. Your health care provider may recommend certain tests to check your strength and balance while standing, walking, or changing positions. Foot health exam. Foot pain and numbness, as well as not wearing proper footwear, can make you more likely  to have a fall. Screenings, including: Osteoporosis screening. Osteoporosis is a condition that causes the bones to get weaker and break more easily. Blood pressure screening. Blood pressure changes and medicines to control blood pressure can make you feel dizzy. Depression screening. You may be more likely to have a fall if you have a fear of falling, feel depressed, or feel unable to do activities that you used to do. Alcohol use screening. Using too much alcohol can affect your balance and may make you more likely to have a fall. Follow these instructions at home: Lifestyle Do not drink alcohol if: Your health care provider tells you not to drink. If you drink alcohol: Limit how much you have to: 0-1 drink a day for women. 0-2 drinks a day for men. Know how much alcohol is in your drink. In the [place] one drink equals one 12 oz bottle of beer (355 mL), one 5 oz glass of wine (148 mL), or one 1 oz glass of hard liquor (44 mL). Do not use any products that contain nicotine or tobacco. These products include cigarettes, chewing tobacco, and vaping devices, such as e-cigarettes. If you need help quitting, ask your health care provider. Activity  Follow a regular exercise program to stay fit. This will help you maintain your balance. Ask your health care provider what types of exercise are appropriate for you. If you need a cane or walker, use it as recommended by your health care provider. Wear supportive shoes  that have nonskid soles. Safety  Remove any tripping hazards, such as rugs, cords, and clutter. Install safety equipment such as grab bars in bathrooms and safety rails on stairs. Keep rooms and walkways well-lit. General instructions Talk with your health care provider about your risks for falling. Tell your health care provider if: You fall. Be sure to tell your health care provider about all falls, even ones that seem minor. You feel dizzy, tiredness (fatigue), or off-balance. Take over-the-counter and prescription medicines only as told by your health care provider. These include supplements. Eat a healthy diet and maintain a healthy weight. A healthy diet includes low-fat dairy products, low-fat (lean) meats, and fiber from whole grains, beans, and lots of fruits and vegetables. Stay current with your vaccines. Schedule regular health, dental, and eye exams. Summary Having a healthy lifestyle and getting preventive care can help to protect your health and wellness after age 74. Screening and testing are the best way to find a health problem early and help you avoid having a fall. Early diagnosis and treatment give you the best chance for managing medical conditions that are more common for people who are older than age 45. Falls are a major cause of broken bones and head injuries in people who are older than age 90. Take precautions to prevent a fall at home. Work with your health care provider to learn what changes you can make to improve your health and wellness and to prevent falls. This information is not intended to replace advice given to you by your health care provider. Make sure you discuss any questions you have with your health care provider. Document Revised: 06/26/2020 Document Reviewed: 06/26/2020 Elsevier Patient Education  2024 Elsevier Inc.      Edwina Barth, MD [facility]

## 2023-04-22 NOTE — Assessment & Plan Note (Signed)
 Stable without hemodynamic compromise. Asymptomatic

## 2023-04-22 NOTE — Assessment & Plan Note (Signed)
 Stable.  Asymptomatic.  Continues pantoprazole 40 mg daily. Also taking Pepcid at bedtime

## 2023-04-22 NOTE — Assessment & Plan Note (Signed)
 now with symptom of chest pain with stress concerning for angina - on optimal medical therapy with metoprolol and amlodipine - on ASA  Scheduled for PET CT stress. If abnormal will need to consider cardiac cath

## 2023-04-22 NOTE — Assessment & Plan Note (Signed)
 Chronic stable condition Continue Zetia 10 mg daily and rosuvastatin 40 mg daily Also on Repatha

## 2023-04-22 NOTE — Assessment & Plan Note (Signed)
 Well-controlled hypertension with normal blood pressure readings at home Recommend to continue amlodipine 5 mg daily, metoprolol tartrate 25 mg twice a day, and Altace 10 mg daily Cardiovascular risks associated with hypertension discussed Diet and nutrition discussed Situational anxiety contributing to hypertension episodes Stress management discussed

## 2023-04-22 NOTE — Assessment & Plan Note (Signed)
 Much difficulty with daughter who has herself new problems with DUI and broken ankle living by herself and not working  Taking Xanax as needed but making her sleepy Stable and handling situation better.

## 2023-04-22 NOTE — Assessment & Plan Note (Addendum)
 [name]

## 2023-04-22 NOTE — Patient Instructions (Signed)
 Health Maintenance After Age 75 After age 4, you are at a higher risk for certain long-term diseases and infections as well as injuries from falls. Falls are a major cause of broken bones and head injuries in people who are older than age 47. Getting regular preventive care can help to keep you healthy and well. Preventive care includes getting regular testing and making lifestyle changes as recommended by your health care provider. Talk with your health care provider about: Which screenings and tests you should have. A screening is a test that checks for a disease when you have no symptoms. A diet and exercise plan that is right for you. What should I know about screenings and tests to prevent falls? Screening and testing are the best ways to find a health problem early. Early diagnosis and treatment give you the best chance of managing medical conditions that are common after age 37. Certain conditions and lifestyle choices may make you more likely to have a fall. Your health care provider may recommend: Regular vision checks. Poor vision and conditions such as cataracts can make you more likely to have a fall. If you wear glasses, make sure to get your prescription updated if your vision changes. Medicine review. Work with your health care provider to regularly review all of the medicines you are taking, including over-the-counter medicines. Ask your health care provider about any side effects that may make you more likely to have a fall. Tell your health care provider if any medicines that you take make you feel dizzy or sleepy. Strength and balance checks. Your health care provider may recommend certain tests to check your strength and balance while standing, walking, or changing positions. Foot health exam. Foot pain and numbness, as well as not wearing proper footwear, can make you more likely to have a fall. Screenings, including: Osteoporosis screening. Osteoporosis is a condition that causes  the bones to get weaker and break more easily. Blood pressure screening. Blood pressure changes and medicines to control blood pressure can make you feel dizzy. Depression screening. You may be more likely to have a fall if you have a fear of falling, feel depressed, or feel unable to do activities that you used to do. Alcohol use screening. Using too much alcohol can affect your balance and may make you more likely to have a fall. Follow these instructions at home: Lifestyle Do not drink alcohol if: Your health care provider tells you not to drink. If you drink alcohol: Limit how much you have to: 0-1 drink a day for women. 0-2 drinks a day for men. Know how much alcohol is in your drink. In the [place] one drink equals one 12 oz bottle of beer (355 mL), one 5 oz glass of wine (148 mL), or one 1 oz glass of hard liquor (44 mL). Do not use any products that contain nicotine or tobacco. These products include cigarettes, chewing tobacco, and vaping devices, such as e-cigarettes. If you need help quitting, ask your health care provider. Activity  Follow a regular exercise program to stay fit. This will help you maintain your balance. Ask your health care provider what types of exercise are appropriate for you. If you need a cane or walker, use it as recommended by your health care provider. Wear supportive shoes that have nonskid soles. Safety  Remove any tripping hazards, such as rugs, cords, and clutter. Install safety equipment such as grab bars in bathrooms and safety rails on stairs. Keep rooms and walkways  well-lit. General instructions Talk with your health care provider about your risks for falling. Tell your health care provider if: You fall. Be sure to tell your health care provider about all falls, even ones that seem minor. You feel dizzy, tiredness (fatigue), or off-balance. Take over-the-counter and prescription medicines only as told by your health care provider. These include  supplements. Eat a healthy diet and maintain a healthy weight. A healthy diet includes low-fat dairy products, low-fat (lean) meats, and fiber from whole grains, beans, and lots of fruits and vegetables. Stay current with your vaccines. Schedule regular health, dental, and eye exams. Summary Having a healthy lifestyle and getting preventive care can help to protect your health and wellness after age 11. Screening and testing are the best way to find a health problem early and help you avoid having a fall. Early diagnosis and treatment give you the best chance for managing medical conditions that are more common for people who are older than age 28. Falls are a major cause of broken bones and head injuries in people who are older than age 48. Take precautions to prevent a fall at home. Work with your health care provider to learn what changes you can make to improve your health and wellness and to prevent falls. This information is not intended to replace advice given to you by your health care provider. Make sure you discuss any questions you have with your health care provider. Document Revised: 06/26/2020 Document Reviewed: 06/26/2020 Elsevier Patient Education  2024 [employer].

## 2023-04-26 ENCOUNTER — Other Ambulatory Visit (INDEPENDENT_AMBULATORY_CARE_PROVIDER_SITE_OTHER): Payer: Self-pay | Admitting: Family Medicine

## 2023-04-26 DIAGNOSIS — Z6830 Body mass index (BMI) 30.0-30.9, adult: Secondary | ICD-10-CM

## 2023-04-29 ENCOUNTER — Ambulatory Visit (INDEPENDENT_AMBULATORY_CARE_PROVIDER_SITE_OTHER): Payer: PPO | Admitting: Family Medicine

## 2023-05-03 ENCOUNTER — Telehealth: Admitting: Nurse Practitioner

## 2023-05-03 ENCOUNTER — Other Ambulatory Visit: Payer: Self-pay | Admitting: Cardiology

## 2023-05-03 DIAGNOSIS — U071 COVID-19: Secondary | ICD-10-CM

## 2023-05-03 MED ORDER — PREDNISONE 20 MG PO TABS: 20.0000 mg | ORAL_TABLET | Freq: Every day | ORAL | 0 refills | Status: AC

## 2023-05-03 MED ORDER — PREDNISONE 10 MG PO TABS
ORAL_TABLET | ORAL | 0 refills | Status: DC
Start: 2023-05-03 — End: 2023-05-03

## 2023-05-03 MED ORDER — NIRMATRELVIR/RITONAVIR (PAXLOVID)TABLET
3.0000 | ORAL_TABLET | Freq: Two times a day (BID) | ORAL | 0 refills | Status: AC
Start: 2023-05-03 — End: 2023-05-08

## 2023-05-03 MED ORDER — PROMETHAZINE-DM 6.25-15 MG/5ML PO SYRP
5.0000 mL | ORAL_SOLUTION | Freq: Four times a day (QID) | ORAL | 0 refills | Status: DC | PRN
Start: 2023-05-03 — End: 2023-05-27

## 2023-05-03 NOTE — Patient Instructions (Signed)
 Courtney [name], thank you for joining Claiborne Rigg, NP for today's virtual visit.  While this provider is not your primary care provider (PCP), if your PCP is located in our provider database this encounter information will be shared with them immediately following your visit.   A [facility] MyChart account gives you access to today's visit and all your visits, tests, and labs performed at [facility] " click here if you don't have a [facility] MyChart account or go to [email or web address]  Consent: (Patient) Courtney [name] provided verbal consent for this virtual visit at the beginning of the encounter.  Current Medications:  Current Outpatient Medications:    nirmatrelvir/ritonavir (PAXLOVID) 20 x 150 MG & 10 x 100MG  TABS, Take 3 tablets by mouth 2 (two) times daily for 5 days. (Take nirmatrelvir 150 mg two tablets twice daily for 5 days and ritonavir 100 mg one tablet twice daily for 5 days) Patient GFR is 69, Disp: 30 tablet, Rfl: 0   predniSONE (DELTASONE) 20 MG tablet, Take 1 tablet (20 mg total) by mouth daily with breakfast for 5 days., Disp: 5 tablet, Rfl: 0   promethazine-dextromethorphan (PROMETHAZINE-DM) 6.25-15 MG/5ML syrup, Take 5 mLs by mouth 4 (four) times daily as needed for cough., Disp: 240 mL, Rfl: 0   ALPRAZolam (XANAX) 0.25 MG tablet, Take 1 tablet (0.25 mg total) by mouth 2 (two) times daily as needed for anxiety., Disp: 60 tablet, Rfl: 0   amLODipine (NORVASC) 5 MG tablet, Take 1 tablet (5 mg total) by mouth daily., Disp: 90 tablet, Rfl: 3   aspirin 81 MG tablet, Take 81 mg by mouth daily., Disp: , Rfl:    clobetasol cream (TEMOVATE) 0.05 %, Apply 1-2 times a day to poison oak for 2 weeks, Disp: 60 g, Rfl: 1   Evolocumab (REPATHA SURECLICK) 140 MG/ML SOAJ, Inject 140 mg into the skin every 14 (fourteen) days., Disp: 6 mL, Rfl: 3   ezetimibe (ZETIA) 10 MG tablet, Take 1 tablet (10 mg total) by mouth daily., Disp: 90 tablet, Rfl: 3   gabapentin  (NEURONTIN) 300 MG capsule, Take 1 capsule (300 mg total) by mouth at bedtime., Disp: 90 capsule, Rfl: 3   metFORMIN (GLUCOPHAGE) 500 MG tablet, Take 1 tablet (500 mg total) by mouth daily with breakfast. (Patient not taking: Reported on 04/22/2023), Disp: 30 tablet, Rfl: 0   metoprolol tartrate (LOPRESSOR) 25 MG tablet, Take 1 tablet (25 mg total) by mouth 2 (two) times daily., Disp: 180 tablet, Rfl: 3   nitroGLYCERIN (NITROSTAT) 0.4 MG SL tablet, Place 1 tablet (0.4 mg total) under the tongue every 5 (five) minutes as needed for chest pain., Disp: 25 tablet, Rfl: 11   pantoprazole (PROTONIX) 40 MG tablet, TAKE 1 TABLET BY MOUTH EVERY DAY, Disp: 90 tablet, Rfl: 3   ramipril (ALTACE) 10 MG capsule, TAKE 1 CAPSULE BY MOUTH EVERY DAY, Disp: 90 capsule, Rfl: 3   rosuvastatin (CRESTOR) 40 MG tablet, Take 1 tablet (40 mg total) by mouth daily., Disp: 90 tablet, Rfl: 3   Medications ordered in this encounter:  Meds ordered this encounter  Medications   nirmatrelvir/ritonavir (PAXLOVID) 20 x 150 MG & 10 x 100MG  TABS    Sig: Take 3 tablets by mouth 2 (two) times daily for 5 days. (Take nirmatrelvir 150 mg two tablets twice daily for 5 days and ritonavir 100 mg one tablet twice daily for 5 days) Patient GFR is 69    Dispense:  30 tablet    Refill:  0    Supervising Provider:   Merrilee Jansky [[id number]]   promethazine-dextromethorphan (PROMETHAZINE-DM) 6.25-15 MG/5ML syrup    Sig: Take 5 mLs by mouth 4 (four) times daily as needed for cough.    Dispense:  240 mL    Refill:  0    Supervising Provider:   Merrilee Jansky [[id number]]   predniSONE (DELTASONE) 20 MG tablet    Sig: Take 1 tablet (20 mg total) by mouth daily with breakfast for 5 days.    Dispense:  5 tablet    Refill:  0    Supervising Provider:   Merrilee Jansky [[id number]]     *If you need refills on other medications prior to your next appointment, please contact your pharmacy*  Follow-Up: Call back or seek an in-person evaluation if  the symptoms worsen or if the condition fails to improve as anticipated.  [facility] [phone]  Other Instructions  Please keep well-hydrated and get plenty of rest. Start a saline nasal rinse to flush out your nasal passages. You can use plain Mucinex to help thin congestion. If you have a humidifier, you can use this daily as needed.    You are to wear a mask for 5 days from onset of your symptoms.  After day 5, if you have had no fever and you are feeling better with NO symptoms, you can end masking. Keep in mind you can be contagious 10 days from the onset of symptoms  After day 5 if you have a fever or are having significant symptoms, please wear your mask for full 10 days.   If you note any worsening of symptoms, any significant shortness of breath or any chest pain, please seek ER evaluation ASAP.  Please do not delay care!    If you note any worsening of symptoms, any significant shortness of breath or any chest pain, please seek ER evaluation ASAP.  Please do not delay care!    If you have been instructed to have an in-person evaluation today at a local Urgent Care facility, please use the link below. It will take you to a list of all of our available [facility], including address, phone number and hours of operation. Please do not delay care.  [facility]  If you or a family member do not have a primary care provider, use the link below to schedule a visit and establish care. When you choose a [facility] primary care physician or advanced practice provider, you gain a long-term partner in health. Find a Primary Care Provider  Learn more about [facility] in-office and virtual care options: [facility] - Get Care Now

## 2023-05-03 NOTE — Progress Notes (Signed)
 E-Visit  for Positive Covid Test Result   If you would like an antiviral for your symptoms you will need to schedule a video visit. We do not prescribe codeine cough syrups. If you would like to proceed with just prednisone for your evisit and do not wish to take an antiviral please respond back to mychart message stating you are only requesting prednisone/steroids at this time. Thank you.      We are sorry you are not feeling well. We are here to help!   You have tested positive for COVID-19, meaning that you were infected with the novel coronavirus and could give the virus to others.  Most people with COVID-19 have mild illness and can recover at home without medical care. Do not leave your home, except to get medical care. Do not visit public areas and do not go to places where you are unable to wear a mask. It is important that you stay home  to take care for yourself and to help protect other people in your home and community.       Isolation Instructions:    You are to isolate at home until you have been fever free for at least 24 hours without a fever-reducing medication, and symptoms have been steadily improving for 24 hours. At that time,  you can end isolation but need to mask for an additional 5 days.   If you must be around other household members who do not have symptoms, you need to make sure that both you and the family members are masking consistently with a high-quality mask.   If you note any worsening of symptoms despite treatment, please seek an in-person evaluation ASAP. If you note any significant shortness of breath or any chest pain, please seek ER evaluation. Please do not delay care!     Go to the nearest hospital ED for assessment if fever/cough/breathlessness are severe or illness seems like a threat to life.     The following symptoms may appear 2-14 days after exposure: Fever Cough Shortness of breath or difficulty breathing Chills Repeated shaking with  chills Muscle pain Headache Sore throat New loss of taste or smell Fatigue Congestion or runny nose Nausea or vomiting Diarrhea     You may also take acetaminophen (Tylenol) as needed for fever.   HOME CARE: Only take medications as instructed by your medical team. Drink plenty of fluids and get plenty of rest. A steam or ultrasonic humidifier can help if you have congestion.    GET HELP RIGHT AWAY IF YOU HAVE EMERGENCY WARNING SIGNS.  Call 911 or proceed to your closest emergency facility if: You develop worsening high fever. Trouble breathing Bluish lips or face Persistent pain or pressure in the chest New confusion Inability to wake or stay awake You cough up blood. Your symptoms become more severe Inability to hold down food or fluids   This list is not all possible symptoms. Contact your medical provider for any symptoms that are severe or concerning to you.     Your e-visit answers were reviewed by a board certified advanced clinical practitioner to complete your personal care plan.  Depending on the condition, your plan could have included both over the counter or prescription medications.  If there is a problem please reply once you have received a response from your provider.   Your safety is important to [place].  If you have drug allergies check your prescription carefully.     You can use MyChart  to ask questions about today's visit, request a non-urgent call back, or ask for a work or school excuse for 24 hours related to this e-Visit. If it has been greater than 24 hours you will need to follow up with your provider, or enter a new e-Visit to address those concerns. You will get an e-mail in the next two days asking about your experience.  I hope that your e-visit has been valuable and will speed your recovery. Thank you for using e-visits.

## 2023-05-03 NOTE — Progress Notes (Signed)
 I have spent 5 minutes in review of e-visit questionnaire, review and updating patient chart, medical decision making and response to patient.   Claiborne Rigg, NP

## 2023-05-03 NOTE — Progress Notes (Signed)
 Virtual Visit Consent   Courtney [name], you are scheduled for a virtual visit with a [facility] provider today. Just as with appointments in the office, your consent must be obtained to participate. Your consent will be active for this visit and any virtual visit you may have with one of our providers in the next 365 days. If you have a MyChart account, a copy of this consent can be sent to you electronically.  As this is a virtual visit, video technology does not allow for your provider to perform a traditional examination. This may limit your provider's ability to fully assess your condition. If your provider identifies any concerns that need to be evaluated in person or the need to arrange testing (such as labs, EKG, etc.), we will make arrangements to do so. Although advances in technology are sophisticated, we cannot ensure that it will always work on either your end or our end. If the connection with a video visit is poor, the visit may have to be switched to a telephone visit. With either a video or telephone visit, we are not always able to ensure that we have a secure connection.  By engaging in this virtual visit, you consent to the provision of healthcare and authorize for your insurance to be billed (if applicable) for the services provided during this visit. Depending on your insurance coverage, you may receive a charge related to this service.  I need to obtain your verbal consent now. Are you willing to proceed with your visit today? Courtney [name] has provided verbal consent on 05/03/2023 for a virtual visit (video or telephone). Claiborne Rigg, NP  Date: 05/03/2023 3:10 PM   Virtual Visit via Video Note   I, Claiborne Rigg, connected with  Courtney [name]  ([id number], [birth year]) on 05/03/23 at  3:30 PM EDT by a video-enabled telemedicine application and verified that I am speaking with the correct person using two identifiers.  Location: Patient: Virtual Visit Location Patient:  Home Provider: Virtual Visit Location Provider: Home Office   I discussed the limitations of evaluation and management by telemedicine and the availability of in person appointments. The patient expressed understanding and agreed to proceed.    History of Present Illness: Courtney [name] is a 73 y.o. who identifies as a female who was assigned female at birth, and is being seen today for COVID positive.  Ms. Markert was treated with steroids earlier this morning through an evisit for COVID positive home antiviral treatment with prednisone. She is now here on video visit requesting paxlovid which I had initially offered to her via evisit and she declined stating she would follow back up later if needed. She is also requesting promethazine cough syrup which I have instructed her it not recommended for her age. She states she has taken this before and would like to have it refilled.     Problems:  Patient Active Problem List   Diagnosis Date Noted   Adjustment reaction with anxiety and depression 10/16/2022   Mixed hyperlipidemia 09/17/2022   BMI 31.0-31.9,adult 03/26/2022   Obesity, Beginning BMI 30.66 03/26/2022   Insulin resistance 02/26/2022   Chronic pain of right knee 10/15/2021   Stress 08/30/2021   Pre-diabetes 07/11/2021   Degenerative disc disease, lumbar 05/19/2020   Restless leg syndrome 05/15/2020   Degenerative disc disease, cervical 02/02/2020   Polyarthralgia 01/03/2020   Patellofemoral arthritis of right knee 06/16/2019   Vitamin D deficiency, Vit D = 43.2 (01/25/19), Rx vit  D 50K IU every 2 weeks 08/04/2018   Cyst of right breast 05/07/2018   Chronic headache disorder 02/08/2018   Essential hypertension 11/17/2017   Class 1 obesity with serious comorbidity and body mass index (BMI) of 30.0 to 30.9 in adult 07/15/2017   Insomnia 07/15/2017   Female stress incontinence 07/15/2017   Anxiety 07/15/2017   Gastroesophageal reflux disease with esophagitis, Rx Protonix  02/25/2017   Osteopenia 06/27/2015   Osteoarthritis of right hip 07/08/2014   Carotid artery disease (HCC)    Aortic valve sclerosis    Dyslipidemia    Coronary artery disease     Allergies:  Allergies  Allergen Reactions   Erythromycin Hives   Penicillins Hives    Has patient had a PCN reaction causing immediate rash, facial/tongue/throat swelling, SOB or lightheadedness with hypotension: No Has patient had a PCN reaction causing severe rash involving mucus membranes or skin necrosis: No Has patient had a PCN reaction that required hospitalization: No Has patient had a PCN reaction occurring within the last 10 years: No If all of the above answers are "NO", then may proceed with Cephalosporin use.    Shellfish Allergy Hives   Doxycycline Rash   Medications:  Current Outpatient Medications:    nirmatrelvir/ritonavir (PAXLOVID) 20 x 150 MG & 10 x 100MG  TABS, Take 3 tablets by mouth 2 (two) times daily for 5 days. (Take nirmatrelvir 150 mg two tablets twice daily for 5 days and ritonavir 100 mg one tablet twice daily for 5 days) Patient GFR is 69, Disp: 30 tablet, Rfl: 0   predniSONE (DELTASONE) 20 MG tablet, Take 1 tablet (20 mg total) by mouth daily with breakfast for 5 days., Disp: 5 tablet, Rfl: 0   promethazine-dextromethorphan (PROMETHAZINE-DM) 6.25-15 MG/5ML syrup, Take 5 mLs by mouth 4 (four) times daily as needed for cough., Disp: 240 mL, Rfl: 0   ALPRAZolam (XANAX) 0.25 MG tablet, Take 1 tablet (0.25 mg total) by mouth 2 (two) times daily as needed for anxiety., Disp: 60 tablet, Rfl: 0   amLODipine (NORVASC) 5 MG tablet, Take 1 tablet (5 mg total) by mouth daily., Disp: 90 tablet, Rfl: 3   aspirin 81 MG tablet, Take 81 mg by mouth daily., Disp: , Rfl:    clobetasol cream (TEMOVATE) 0.05 %, Apply 1-2 times a day to poison oak for 2 weeks, Disp: 60 g, Rfl: 1   Evolocumab (REPATHA SURECLICK) 140 MG/ML SOAJ, Inject 140 mg into the skin every 14 (fourteen) days., Disp: 6 mL, Rfl:  3   ezetimibe (ZETIA) 10 MG tablet, Take 1 tablet (10 mg total) by mouth daily., Disp: 90 tablet, Rfl: 3   gabapentin (NEURONTIN) 300 MG capsule, Take 1 capsule (300 mg total) by mouth at bedtime., Disp: 90 capsule, Rfl: 3   metFORMIN (GLUCOPHAGE) 500 MG tablet, Take 1 tablet (500 mg total) by mouth daily with breakfast. (Patient not taking: Reported on 04/22/2023), Disp: 30 tablet, Rfl: 0   metoprolol tartrate (LOPRESSOR) 25 MG tablet, Take 1 tablet (25 mg total) by mouth 2 (two) times daily., Disp: 180 tablet, Rfl: 3   nitroGLYCERIN (NITROSTAT) 0.4 MG SL tablet, Place 1 tablet (0.4 mg total) under the tongue every 5 (five) minutes as needed for chest pain., Disp: 25 tablet, Rfl: 11   pantoprazole (PROTONIX) 40 MG tablet, TAKE 1 TABLET BY MOUTH EVERY DAY, Disp: 90 tablet, Rfl: 3   ramipril (ALTACE) 10 MG capsule, TAKE 1 CAPSULE BY MOUTH EVERY DAY, Disp: 90 capsule, Rfl: 3   rosuvastatin (  CRESTOR) 40 MG tablet, Take 1 tablet (40 mg total) by mouth daily., Disp: 90 tablet, Rfl: 3  Observations/Objective: Patient is well-developed, well-nourished in no acute distress.  Resting comfortably at home.  Head is normocephalic, atraumatic.  No labored breathing.  Speech is clear and coherent with logical content.  Patient is alert and oriented at baseline.    Assessment and Plan: 1. Positive self-administered antigen test for COVID-19 (Primary) - nirmatrelvir/ritonavir (PAXLOVID) 20 x 150 MG & 10 x 100MG  TABS; Take 3 tablets by mouth 2 (two) times daily for 5 days. (Take nirmatrelvir 150 mg two tablets twice daily for 5 days and ritonavir 100 mg one tablet twice daily for 5 days) Patient GFR is 69  Dispense: 30 tablet; Refill: 0 - promethazine-dextromethorphan (PROMETHAZINE-DM) 6.25-15 MG/5ML syrup; Take 5 mLs by mouth 4 (four) times daily as needed for cough.  Dispense: 240 mL; Refill: 0 - predniSONE (DELTASONE) 20 MG tablet; Take 1 tablet (20 mg total) by mouth daily with breakfast for 5 days.   Dispense: 5 tablet; Refill: 0    Follow Up Instructions: I discussed the assessment and treatment plan with the patient. The patient was provided an opportunity to ask questions and all were answered. The patient agreed with the plan and demonstrated an understanding of the instructions.  A copy of instructions were sent to the patient via MyChart unless otherwise noted below.    The patient was advised to call back or seek an in-person evaluation if the symptoms worsen or if the condition fails to improve as anticipated.    Claiborne Rigg, NP

## 2023-05-10 ENCOUNTER — Telehealth: Admitting: Nurse Practitioner

## 2023-05-10 DIAGNOSIS — J40 Bronchitis, not specified as acute or chronic: Secondary | ICD-10-CM | POA: Diagnosis not present

## 2023-05-10 MED ORDER — AZITHROMYCIN 250 MG PO TABS
ORAL_TABLET | ORAL | 0 refills | Status: AC
Start: 2023-05-10 — End: 2023-05-15

## 2023-05-10 MED ORDER — BENZONATATE 100 MG PO CAPS
100.0000 mg | ORAL_CAPSULE | Freq: Two times a day (BID) | ORAL | 0 refills | Status: DC | PRN
Start: 2023-05-10 — End: 2023-11-04

## 2023-05-10 NOTE — Progress Notes (Signed)
 Virtual Visit Consent   Courtney [name], you are scheduled for a virtual visit with a [facility] provider today. Just as with appointments in the office, your consent must be obtained to participate. Your consent will be active for this visit and any virtual visit you may have with one of our providers in the next 365 days. If you have a MyChart account, a copy of this consent can be sent to you electronically.  As this is a virtual visit, video technology does not allow for your provider to perform a traditional examination. This may limit your provider's ability to fully assess your condition. If your provider identifies any concerns that need to be evaluated in person or the need to arrange testing (such as labs, EKG, etc.), we will make arrangements to do so. Although advances in technology are sophisticated, we cannot ensure that it will always work on either your end or our end. If the connection with a video visit is poor, the visit may have to be switched to a telephone visit. With either a video or telephone visit, we are not always able to ensure that we have a secure connection.  By engaging in this virtual visit, you consent to the provision of healthcare and authorize for your insurance to be billed (if applicable) for the services provided during this visit. Depending on your insurance coverage, you may receive a charge related to this service.  I need to obtain your verbal consent now. Are you willing to proceed with your visit today? ANALIS DISTLER has provided verbal consent on 05/10/2023 for a virtual visit (video or telephone). Claiborne Rigg, NP  Date: 05/10/2023 9:35 AM   Virtual Visit via Video Note   I, Claiborne Rigg, connected with  Courtney [name]  ([id number], March 11, 1950) on 05/10/23 at  9:30 AM EDT by a video-enabled telemedicine application and verified that I am speaking with the correct person using two identifiers.  Location: Patient: Virtual Visit Location Patient:  Home Provider: Virtual Visit Location Provider: Home Office   I discussed the limitations of evaluation and management by telemedicine and the availability of in person appointments. The patient expressed understanding and agreed to proceed.    History of Present Illness: Courtney [name] is a 73 y.o. who identifies as a female who was assigned female at birth, and is being seen today for cough.  Ms. Meinhart was treated for COVID on 05-03-2023 with Paxlovid and prednisone. Today she states despite completing treatment she has been experiencing cough, chest tightness and fatigue. Per her report, she normally gets bronchitis around the same time every year and current symptoms are similar. She is requesting zpak today.   Problems:  Patient Active Problem List   Diagnosis Date Noted   Adjustment reaction with anxiety and depression 10/16/2022   Mixed hyperlipidemia 09/17/2022   BMI 31.0-31.9,adult 03/26/2022   Obesity, Beginning BMI 30.66 03/26/2022   Insulin resistance 02/26/2022   Chronic pain of right knee 10/15/2021   Stress 08/30/2021   Pre-diabetes 07/11/2021   Degenerative disc disease, lumbar 05/19/2020   Restless leg syndrome 05/15/2020   Degenerative disc disease, cervical 02/02/2020   Polyarthralgia 01/03/2020   Patellofemoral arthritis of right knee 06/16/2019   Vitamin D deficiency, Vit D = 43.2 (01/25/19), Rx vit D 50K IU every 2 weeks 08/04/2018   Cyst of right breast 05/07/2018   Chronic headache disorder 02/08/2018   Essential hypertension 11/17/2017   Class 1 obesity with serious comorbidity and body  mass index (BMI) of 30.0 to 30.9 in adult 07/15/2017   Insomnia 07/15/2017   Female stress incontinence 07/15/2017   Anxiety 07/15/2017   Gastroesophageal reflux disease with esophagitis, Rx Protonix 02/25/2017   Osteopenia 06/27/2015   Osteoarthritis of right hip 07/08/2014   Carotid artery disease (HCC)    Aortic valve sclerosis    Dyslipidemia    Coronary artery  disease     Allergies:  Allergies  Allergen Reactions   Erythromycin Hives   Penicillins Hives    Has patient had a PCN reaction causing immediate rash, facial/tongue/throat swelling, SOB or lightheadedness with hypotension: No Has patient had a PCN reaction causing severe rash involving mucus membranes or skin necrosis: No Has patient had a PCN reaction that required hospitalization: No Has patient had a PCN reaction occurring within the last 10 years: No If all of the above answers are "NO", then may proceed with Cephalosporin use.    Shellfish Allergy Hives   Doxycycline Rash   Medications:  Current Outpatient Medications:    azithromycin (ZITHROMAX) 250 MG tablet, Take 2 tablets on day 1, then 1 tablet daily on days 2 through 5, Disp: 6 tablet, Rfl: 0   benzonatate (TESSALON) 100 MG capsule, Take 1 capsule (100 mg total) by mouth 2 (two) times daily as needed for cough., Disp: 20 capsule, Rfl: 0   ALPRAZolam (XANAX) 0.25 MG tablet, Take 1 tablet (0.25 mg total) by mouth 2 (two) times daily as needed for anxiety., Disp: 60 tablet, Rfl: 0   amLODipine (NORVASC) 5 MG tablet, Take 1 tablet (5 mg total) by mouth daily., Disp: 90 tablet, Rfl: 3   aspirin 81 MG tablet, Take 81 mg by mouth daily., Disp: , Rfl:    clobetasol cream (TEMOVATE) 0.05 %, Apply 1-2 times a day to poison oak for 2 weeks, Disp: 60 g, Rfl: 1   Evolocumab (REPATHA SURECLICK) 140 MG/ML SOAJ, Inject 140 mg into the skin every 14 (fourteen) days., Disp: 6 mL, Rfl: 3   ezetimibe (ZETIA) 10 MG tablet, TAKE 1 TABLET BY MOUTH EVERY DAY, Disp: 90 tablet, Rfl: 3   gabapentin (NEURONTIN) 300 MG capsule, Take 1 capsule (300 mg total) by mouth at bedtime., Disp: 90 capsule, Rfl: 3   metFORMIN (GLUCOPHAGE) 500 MG tablet, Take 1 tablet (500 mg total) by mouth daily with breakfast. (Patient not taking: Reported on 04/22/2023), Disp: 30 tablet, Rfl: 0   metoprolol tartrate (LOPRESSOR) 25 MG tablet, Take 1 tablet (25 mg total) by mouth 2  (two) times daily., Disp: 180 tablet, Rfl: 3   nitroGLYCERIN (NITROSTAT) 0.4 MG SL tablet, Place 1 tablet (0.4 mg total) under the tongue every 5 (five) minutes as needed for chest pain., Disp: 25 tablet, Rfl: 11   pantoprazole (PROTONIX) 40 MG tablet, TAKE 1 TABLET BY MOUTH EVERY DAY, Disp: 90 tablet, Rfl: 3   promethazine-dextromethorphan (PROMETHAZINE-DM) 6.25-15 MG/5ML syrup, Take 5 mLs by mouth 4 (four) times daily as needed for cough., Disp: 240 mL, Rfl: 0   ramipril (ALTACE) 10 MG capsule, TAKE 1 CAPSULE BY MOUTH EVERY DAY, Disp: 90 capsule, Rfl: 3   rosuvastatin (CRESTOR) 40 MG tablet, TAKE 1 TABLET BY MOUTH EVERY DAY, Disp: 90 tablet, Rfl: 3  Observations/Objective: Patient is well-developed, well-nourished in no acute distress.  Resting comfortably at home.  Head is normocephalic, atraumatic.  No labored breathing.  Speech is clear and coherent with logical content.  Patient is alert and oriented at baseline.   Assessment and Plan: 1. Bronchitis (  Primary) - azithromycin (ZITHROMAX) 250 MG tablet; Take 2 tablets on day 1, then 1 tablet daily on days 2 through 5  Dispense: 6 tablet; Refill: 0 - benzonatate (TESSALON) 100 MG capsule; Take 1 capsule (100 mg total) by mouth 2 (two) times daily as needed for cough.  Dispense: 20 capsule; Refill: 0  INSTRUCTIONS: use a humidifier for nasal congestion Drink plenty of fluids, rest and wash hands frequently to avoid the spread of infection Alternate tylenol and Motrin for relief of fever    Follow Up Instructions: I discussed the assessment and treatment plan with the patient. The patient was provided an opportunity to ask questions and all were answered. The patient agreed with the plan and demonstrated an understanding of the instructions.  A copy of instructions were sent to the patient via MyChart unless otherwise noted below.    The patient was advised to call back or seek an in-person evaluation if the symptoms worsen or if the  condition fails to improve as anticipated.    Claiborne Rigg, NP

## 2023-05-10 NOTE — Patient Instructions (Signed)
 Courtney [name], thank you for joining Claiborne Rigg, NP for today's virtual visit.  While this provider is not your primary care provider (PCP), if your PCP is located in our provider database this encounter information will be shared with them immediately following your visit.   A [facility] MyChart account gives you access to today's visit and all your visits, tests, and labs performed at [facility] " click here if you don't have a  MyChart account or go to [email or web address]  Consent: (Patient) Courtney [name] provided verbal consent for this virtual visit at the beginning of the encounter.  Current Medications:  Current Outpatient Medications:    azithromycin (ZITHROMAX) 250 MG tablet, Take 2 tablets on day 1, then 1 tablet daily on days 2 through 5, Disp: 6 tablet, Rfl: 0   benzonatate (TESSALON) 100 MG capsule, Take 1 capsule (100 mg total) by mouth 2 (two) times daily as needed for cough., Disp: 20 capsule, Rfl: 0   ALPRAZolam (XANAX) 0.25 MG tablet, Take 1 tablet (0.25 mg total) by mouth 2 (two) times daily as needed for anxiety., Disp: 60 tablet, Rfl: 0   amLODipine (NORVASC) 5 MG tablet, Take 1 tablet (5 mg total) by mouth daily., Disp: 90 tablet, Rfl: 3   aspirin 81 MG tablet, Take 81 mg by mouth daily., Disp: , Rfl:    clobetasol cream (TEMOVATE) 0.05 %, Apply 1-2 times a day to poison oak for 2 weeks, Disp: 60 g, Rfl: 1   Evolocumab (REPATHA SURECLICK) 140 MG/ML SOAJ, Inject 140 mg into the skin every 14 (fourteen) days., Disp: 6 mL, Rfl: 3   ezetimibe (ZETIA) 10 MG tablet, TAKE 1 TABLET BY MOUTH EVERY DAY, Disp: 90 tablet, Rfl: 3   gabapentin (NEURONTIN) 300 MG capsule, Take 1 capsule (300 mg total) by mouth at bedtime., Disp: 90 capsule, Rfl: 3   metFORMIN (GLUCOPHAGE) 500 MG tablet, Take 1 tablet (500 mg total) by mouth daily with breakfast. (Patient not taking: Reported on 04/22/2023), Disp: 30 tablet, Rfl: 0   metoprolol tartrate (LOPRESSOR) 25 MG  tablet, Take 1 tablet (25 mg total) by mouth 2 (two) times daily., Disp: 180 tablet, Rfl: 3   nitroGLYCERIN (NITROSTAT) 0.4 MG SL tablet, Place 1 tablet (0.4 mg total) under the tongue every 5 (five) minutes as needed for chest pain., Disp: 25 tablet, Rfl: 11   pantoprazole (PROTONIX) 40 MG tablet, TAKE 1 TABLET BY MOUTH EVERY DAY, Disp: 90 tablet, Rfl: 3   promethazine-dextromethorphan (PROMETHAZINE-DM) 6.25-15 MG/5ML syrup, Take 5 mLs by mouth 4 (four) times daily as needed for cough., Disp: 240 mL, Rfl: 0   ramipril (ALTACE) 10 MG capsule, TAKE 1 CAPSULE BY MOUTH EVERY DAY, Disp: 90 capsule, Rfl: 3   rosuvastatin (CRESTOR) 40 MG tablet, TAKE 1 TABLET BY MOUTH EVERY DAY, Disp: 90 tablet, Rfl: 3   Medications ordered in this encounter:  Meds ordered this encounter  Medications   azithromycin (ZITHROMAX) 250 MG tablet    Sig: Take 2 tablets on day 1, then 1 tablet daily on days 2 through 5    Dispense:  6 tablet    Refill:  0    Supervising Provider:   Merrilee Jansky [[id number]]   benzonatate (TESSALON) 100 MG capsule    Sig: Take 1 capsule (100 mg total) by mouth 2 (two) times daily as needed for cough.    Dispense:  20 capsule    Refill:  0    Supervising Provider:  Merrilee Jansky [[id number]]     *If you need refills on other medications prior to your next appointment, please contact your pharmacy*  Follow-Up: Call back or seek an in-person evaluation if the symptoms worsen or if the condition fails to improve as anticipated.  [facility] [phone]  Other Instructions INSTRUCTIONS: use a humidifier for nasal congestion Drink plenty of fluids, rest and wash hands frequently to avoid the spread of infection Alternate tylenol and Motrin for relief of fever    If you have been instructed to have an in-person evaluation today at a local Urgent Care facility, please use the link below. It will take you to a list of all of our available [facility],  including address, phone number and hours of operation. Please do not delay care.  [facility]  If you or a family member do not have a primary care provider, use the link below to schedule a visit and establish care. When you choose a [facility] primary care physician or advanced practice provider, you gain a long-term partner in health. Find a Primary Care Provider  Learn more about [facility]'s in-office and virtual care options: [facility] - Get Care Now

## 2023-05-12 ENCOUNTER — Ambulatory Visit (AMBULATORY_SURGERY_CENTER): Payer: PPO

## 2023-05-12 VITALS — Ht 60.0 in | Wt 160.0 lb

## 2023-05-12 DIAGNOSIS — Z1211 Encounter for screening for malignant neoplasm of colon: Secondary | ICD-10-CM

## 2023-05-12 NOTE — Progress Notes (Signed)
 No egg or soy allergy known to patient  No issues known to pt with past sedation with any surgeries or procedures Patient denies ever being told they had issues or difficulty with intubation  No FH of Malignant Hyperthermia Pt is not on diet pills Pt is not on  home 02  Pt is not on blood thinners  Pt denies issues with constipation  No A fib or A flutter Have any cardiac testing pending--05/20/23 Pt can ambulate independently Pt denies use of chewing tobacco Discussed diabetic I weight loss medication holds Discussed NSAID holds Checked BMI Pt instructed to use Single[employer]com or GoodRx for a price reduction on prep  Patient's chart reviewed by Cathlyn Parsons CNRA prior to previsit and patient appropriate for the [facility].  Pre visit completed and red dot placed by patient's name on their procedure day (on provider's schedule).

## 2023-05-16 ENCOUNTER — Encounter (HOSPITAL_COMMUNITY): Payer: Self-pay

## 2023-05-19 ENCOUNTER — Telehealth (HOSPITAL_COMMUNITY): Payer: Self-pay | Admitting: *Deleted

## 2023-05-19 NOTE — Telephone Encounter (Signed)
 Attempted to call patient regarding upcoming cardiac PET appointment. Left message on voicemail with name and callback number Johney Frame RN Navigator Cardiac Imaging [facility] [phone] Office  Advised to avoid caffeine for 12 hours prior to test.

## 2023-05-20 ENCOUNTER — Encounter (HOSPITAL_COMMUNITY)
Admission: RE | Admit: 2023-05-20 | Discharge: 2023-05-20 | Disposition: A | Discharge status: Home / Self Care | Payer: PPO | Source: Ambulatory Visit | Attending: Cardiology | Admitting: Cardiology

## 2023-05-20 DIAGNOSIS — I2510 Atherosclerotic heart disease of native coronary artery without angina pectoris: Secondary | ICD-10-CM | POA: Diagnosis not present

## 2023-05-20 DIAGNOSIS — I10 Essential (primary) hypertension: Secondary | ICD-10-CM | POA: Diagnosis not present

## 2023-05-20 DIAGNOSIS — I25110 Atherosclerotic heart disease of native coronary artery with unstable angina pectoris: Secondary | ICD-10-CM | POA: Insufficient documentation

## 2023-05-20 LAB — NM PET CT CARDIAC PERFUSION MULTI W/ABSOLUTE BLOODFLOW
MBFR: 1.95
Nuc Rest EF: 61 %
Nuc Stress EF: 64 %
Peak HR: 68 {beats}/min
Rest HR: 46 {beats}/min
Rest MBF: 0.63 ml/g/min
Rest Nuclear Isotope Dose: 18.9 mCi
ST Depression (mm): 0 mm
Stress MBF: 1.23 ml/g/min
Stress Nuclear Isotope Dose: 19 mCi

## 2023-05-20 MED ORDER — RUBIDIUM RB82 GENERATOR (RUBYFILL)
18.9900 | PACK | Freq: Once | INTRAVENOUS | Status: AC
  Administered 2023-05-20: 18.99 via INTRAVENOUS

## 2023-05-20 MED ORDER — REGADENOSON 0.4 MG/5ML IV SOLN
INTRAVENOUS | Status: AC
  Filled 2023-05-20: qty 5

## 2023-05-20 MED ORDER — DEXTROSE 5 % IV SOLN
INTRAVENOUS | Status: AC
  Filled 2023-05-20: qty 50

## 2023-05-20 MED ORDER — CAFFEINE CITRATE BASE COMPONENT 10 MG/ML IV SOLN
INTRAVENOUS | Status: AC
  Filled 2023-05-20: qty 3

## 2023-05-20 MED ORDER — REGADENOSON 0.4 MG/5ML IV SOLN
0.4000 mg | Freq: Once | INTRAVENOUS | Status: AC
  Administered 2023-05-20: 0.4 mg via INTRAVENOUS

## 2023-05-20 MED ORDER — RUBIDIUM RB82 GENERATOR (RUBYFILL)
18.8600 | PACK | Freq: Once | INTRAVENOUS | Status: AC
  Administered 2023-05-20: 18.86 via INTRAVENOUS

## 2023-05-21 ENCOUNTER — Other Ambulatory Visit: Payer: Self-pay | Admitting: Emergency Medicine

## 2023-05-21 ENCOUNTER — Encounter: Payer: Self-pay | Admitting: Emergency Medicine

## 2023-05-21 MED ORDER — MUPIROCIN 2 % EX OINT: 1.0000 | TOPICAL_OINTMENT | Freq: Two times a day (BID) | CUTANEOUS | 1 refills | Status: DC

## 2023-05-21 NOTE — Telephone Encounter (Signed)
 Recommend to use Bactroban ointment to pustules in the face.  New prescription sent to pharmacy of record today.

## 2023-05-22 ENCOUNTER — Encounter: Payer: Self-pay | Admitting: Internal Medicine

## 2023-05-23 ENCOUNTER — Telehealth: Payer: Self-pay | Admitting: Pediatrics

## 2023-05-23 ENCOUNTER — Telehealth: Payer: Self-pay | Admitting: Internal Medicine

## 2023-05-23 NOTE — Telephone Encounter (Signed)
 Contacted on-call via answering service regarding Courtney [name] calling in about pending procedure on 05/26/2023.  She was reported to have a very deep cough and is requesting guidance as to proceeding with the procedure on Monday.  Chart review indicates that the issue was discussed with the anesthesia team just before 5 PM today who advised letting Courtney [name] know that if she did not have a fever and felt okay to proceed with with the cough she may present for her procedure on Monday.  I attempted to contact Courtney [name] back at the number provided by the answering service [phone] but it went to voicemail identified as Courtney [name].  I left a voice message outlining the information provided by the anesthesia staff and advised her to call the answering service back if she had any additional questions.[name]

## 2023-05-23 NOTE — Telephone Encounter (Signed)
 PT is scheduled for a colonoscopy on 4/7 and wanted to make [place] aware that she has a deep  persistant cough. She had covid 3/13 and it has just lingered since then. She wants to know if she will still be able to have procedure. Please advise.

## 2023-05-23 NOTE — Telephone Encounter (Signed)
 Left VM to have pt call [facility] back.   Spoke with Tabatha B, CRNA and was advised to let pt know that if she did not have a fever, and felt okay to proceed with the cough then it was okay to proceed with procedure.

## 2023-05-23 NOTE — Telephone Encounter (Signed)
 Received another contact for Courtney [name] to the answering service.  This time she answered the phone.  We discussed that she has had a chronic cough since she was diagnosed with COVID last month.  No fevers.  Notes that she is slightly winded.  Feels that her cough somewhat worsened today.  She would like to go to urgent care tomorrow for further evaluation.  I reiterated the information outlined by our anesthesia team.  Also suggested that if new information was gleaned during urgent care visit tomorrow that would contraindicate undergoing anesthesia or having the procedure she is welcome to contact our on-call service back for an update.  All questions answered.

## 2023-05-24 ENCOUNTER — Telehealth: Payer: Self-pay | Admitting: Nurse Practitioner

## 2023-05-24 DIAGNOSIS — R062 Wheezing: Secondary | ICD-10-CM | POA: Diagnosis not present

## 2023-05-24 DIAGNOSIS — R053 Chronic cough: Secondary | ICD-10-CM | POA: Diagnosis not present

## 2023-05-24 DIAGNOSIS — R0602 Shortness of breath: Secondary | ICD-10-CM | POA: Diagnosis not present

## 2023-05-24 DIAGNOSIS — J189 Pneumonia, unspecified organism: Secondary | ICD-10-CM | POA: Diagnosis not present

## 2023-05-24 NOTE — Telephone Encounter (Signed)
 Patient called answering service today. Diagnosed with pneumonia and needs to cancel colonoscopy on Monday with Dr. Leone Payor. I advised her to call office to reschedule when feeling better.

## 2023-05-25 ENCOUNTER — Emergency Department (HOSPITAL_BASED_OUTPATIENT_CLINIC_OR_DEPARTMENT_OTHER)

## 2023-05-25 ENCOUNTER — Emergency Department (HOSPITAL_BASED_OUTPATIENT_CLINIC_OR_DEPARTMENT_OTHER): Admitting: Radiology

## 2023-05-25 ENCOUNTER — Other Ambulatory Visit: Payer: Self-pay

## 2023-05-25 ENCOUNTER — Emergency Department (HOSPITAL_BASED_OUTPATIENT_CLINIC_OR_DEPARTMENT_OTHER)
Admission: EM | Admit: 2023-05-25 | Discharge: 2023-05-25 | Disposition: A | Discharge status: Home / Self Care | Attending: Emergency Medicine | Admitting: Emergency Medicine

## 2023-05-25 DIAGNOSIS — I2699 Other pulmonary embolism without acute cor pulmonale: Secondary | ICD-10-CM | POA: Insufficient documentation

## 2023-05-25 DIAGNOSIS — Z7982 Long term (current) use of aspirin: Secondary | ICD-10-CM | POA: Insufficient documentation

## 2023-05-25 DIAGNOSIS — J210 Acute bronchiolitis due to respiratory syncytial virus: Secondary | ICD-10-CM | POA: Diagnosis not present

## 2023-05-25 DIAGNOSIS — I2510 Atherosclerotic heart disease of native coronary artery without angina pectoris: Secondary | ICD-10-CM | POA: Diagnosis not present

## 2023-05-25 DIAGNOSIS — U071 COVID-19: Secondary | ICD-10-CM | POA: Diagnosis not present

## 2023-05-25 DIAGNOSIS — Z7901 Long term (current) use of anticoagulants: Secondary | ICD-10-CM | POA: Insufficient documentation

## 2023-05-25 DIAGNOSIS — R0602 Shortness of breath: Secondary | ICD-10-CM | POA: Diagnosis not present

## 2023-05-25 DIAGNOSIS — J449 Chronic obstructive pulmonary disease, unspecified: Secondary | ICD-10-CM | POA: Insufficient documentation

## 2023-05-25 DIAGNOSIS — Z85828 Personal history of other malignant neoplasm of skin: Secondary | ICD-10-CM | POA: Diagnosis not present

## 2023-05-25 DIAGNOSIS — I700 Atherosclerosis of aorta: Secondary | ICD-10-CM | POA: Diagnosis not present

## 2023-05-25 DIAGNOSIS — J9811 Atelectasis: Secondary | ICD-10-CM | POA: Diagnosis not present

## 2023-05-25 LAB — COMPREHENSIVE METABOLIC PANEL WITH GFR
ALT: 16 U/L (ref 0–44)
AST: 22 U/L (ref 15–41)
Albumin: 4.1 g/dL (ref 3.5–5.0)
Alkaline Phosphatase: 57 U/L (ref 38–126)
Anion gap: 9 (ref 5–15)
BUN: 13 mg/dL (ref 8–23)
CO2: 23 mmol/L (ref 22–32)
Calcium: 9.2 mg/dL (ref 8.9–10.3)
Chloride: 106 mmol/L (ref 98–111)
Creatinine, Ser: 0.83 mg/dL (ref 0.44–1.00)
GFR, Estimated: >60 mL/min (ref >60)
Glucose, Bld: 115 mg/dL — ABNORMAL HIGH (ref 70–99)
Potassium: 3.6 mmol/L (ref 3.5–5.1)
Sodium: 138 mmol/L (ref 135–145)
Total Bilirubin: 0.3 mg/dL (ref 0.0–1.2)
Total Protein: 6.7 g/dL (ref 6.5–8.1)

## 2023-05-25 LAB — RESP PANEL BY RT-PCR (RSV, FLU A&B, COVID)  RVPGX2
Influenza A by PCR: NEGATIVE
Influenza B by PCR: NEGATIVE
Resp Syncytial Virus by PCR: POSITIVE — AB
SARS Coronavirus 2 by RT PCR: POSITIVE — AB

## 2023-05-25 LAB — CBC
HCT: 35.5 % — ABNORMAL LOW (ref 36.0–46.0)
Hemoglobin: 12.2 g/dL (ref 12.0–15.0)
MCH: 31.9 pg (ref 26.0–34.0)
MCHC: 34.4 g/dL (ref 30.0–36.0)
MCV: 92.7 fL (ref 80.0–100.0)
Platelets: 154 K/uL (ref 150–400)
RBC: 3.83 MIL/uL — ABNORMAL LOW (ref 3.87–5.11)
RDW: 12.4 % (ref 11.5–15.5)
WBC: 6.4 K/uL (ref 4.0–10.5)
nRBC: 0 % (ref 0.0–0.2)

## 2023-05-25 MED ORDER — APIXABAN 2.5 MG PO TABS
10.0000 mg | ORAL_TABLET | Freq: Once | ORAL | Status: AC
  Administered 2023-05-25: 10 mg via ORAL
  Filled 2023-05-25: qty 4

## 2023-05-25 MED ORDER — METHYLPREDNISOLONE SODIUM SUCC 125 MG IJ SOLR
125.0000 mg | Freq: Once | INTRAMUSCULAR | Status: AC
Start: 2023-05-25 — End: 2023-05-25
  Administered 2023-05-25: 125 mg via INTRAVENOUS
  Filled 2023-05-25: qty 2

## 2023-05-25 MED ORDER — APIXABAN 5 MG PO TABS: ORAL_TABLET | ORAL | 0 refills | Status: DC

## 2023-05-25 MED ORDER — AEROCHAMBER PLUS FLO-VU MEDIUM MISC
1.0000 | Freq: Once | Status: AC
  Administered 2023-05-25: 1
  Filled 2023-05-25: qty 1

## 2023-05-25 MED ORDER — IPRATROPIUM-ALBUTEROL 0.5-2.5 (3) MG/3ML IN SOLN
RESPIRATORY_TRACT | Status: AC
  Administered 2023-05-25: 3 mL
  Filled 2023-05-25: qty 3

## 2023-05-25 MED ORDER — ACETAMINOPHEN 325 MG PO TABS
650.0000 mg | ORAL_TABLET | Freq: Once | ORAL | Status: AC
  Administered 2023-05-25: 650 mg via ORAL
  Filled 2023-05-25: qty 2

## 2023-05-25 MED ORDER — APIXABAN 5 MG PO TABS
ORAL_TABLET | ORAL | 0 refills | Status: DC
  Filled 2023-05-25: qty 60, 23d supply, fill #0

## 2023-05-25 MED ORDER — APIXABAN (ELIQUIS) EDUCATION KIT FOR DVT/PE PATIENTS: PACK | Freq: Once | Status: AC

## 2023-05-25 MED ORDER — IOHEXOL 350 MG/ML SOLN
75.0000 mL | Freq: Once | INTRAVENOUS | Status: AC | PRN
  Administered 2023-05-25: 75 mL via INTRAVENOUS

## 2023-05-25 MED ORDER — PREDNISONE 50 MG PO TABS: 50.0000 mg | ORAL_TABLET | Freq: Every day | ORAL | 0 refills | Status: DC

## 2023-05-25 MED ORDER — ALBUTEROL SULFATE (2.5 MG/3ML) 0.083% IN NEBU
INHALATION_SOLUTION | RESPIRATORY_TRACT | Status: AC
  Administered 2023-05-25: 2.5 mg
  Filled 2023-05-25: qty 3

## 2023-05-25 MED ORDER — PREDNISONE 50 MG PO TABS
50.0000 mg | ORAL_TABLET | Freq: Every day | ORAL | 0 refills | Status: DC
  Filled 2023-05-25 – 2023-05-26 (×2): qty 5, 5d supply, fill #0

## 2023-05-25 NOTE — ED Notes (Signed)
 Patient transported to CT

## 2023-05-25 NOTE — ED Triage Notes (Signed)
 Pt Covid+ on March 14, rx'd zpac on March 22nd with initial improvement, but has had worsening sx in past couple weeks. Pt dx'd with pneumonia and started abx yesterday, but states this morning she feels worse still.  Pt states DOE and while at rest and persistent dry cough. Pt denies CP.

## 2023-05-25 NOTE — Discharge Instructions (Addendum)
Stop Cipro.

## 2023-05-25 NOTE — ED Provider Notes (Signed)
 [place] EMERGENCY DEPARTMENT AT [facility], [place] Provider Note   CSN: [id number] Arrival date & time: 05/25/23  0946     History  Chief Complaint  Patient presents with   Shortness of Breath    Courtney [name] is a 73 y.o. female.  Pt is a 73 yo female with pmhx significant for carotid artery disease, aortic valve stenosis, CAD, GERD, palpitations, skin cancer, copd and hld.  Pt was diagnosed with Covid on 3/14 and was rx'd paxlovid, but she did not take it.  She was given a z-pack on 3/22 and initially felt better, but started feeling bad again.  She was re-started on abx (cipro) yesterday by [facility], but she is more sob this am.  She has some wheezing and continued dry cough.       Home Medications Prior to Admission medications   Medication Sig Start Date End Date Taking? Authorizing Provider  apixaban (ELIQUIS) 5 MG TABS tablet Take 2 tablets (10mg ) twice daily for 7 days, then 1 tablet (5mg ) twice daily 05/25/23  Yes Jacalyn Lefevre, MD  predniSONE (DELTASONE) 50 MG tablet Take 1 tablet (50 mg total) by mouth daily with breakfast. 05/25/23  Yes Jacalyn Lefevre, MD  ALPRAZolam Prudy Feeler) 0.25 MG tablet Take 1 tablet (0.25 mg total) by mouth 2 (two) times daily as needed for anxiety. 02/17/23   Corwin Levins, MD  amLODipine (NORVASC) 5 MG tablet Take 1 tablet (5 mg total) by mouth daily. 02/17/23 02/17/24  Corwin Levins, MD  aspirin 81 MG tablet Take 81 mg by mouth daily.    [provider]  benzonatate (TESSALON) 100 MG capsule Take 1 capsule (100 mg total) by mouth 2 (two) times daily as needed for cough. 05/10/23   Claiborne Rigg, NP  clobetasol cream (TEMOVATE) 0.05 % Apply 1-2 times a day to poison oak for 2 weeks Patient not taking: Reported on 05/12/2023 07/02/21   Janalyn Harder, MD  Evolocumab (REPATHA SURECLICK) 140 MG/ML SOAJ Inject 140 mg into the skin every 14 (fourteen) days. 12/04/22   [place], Peter M, MD  ezetimibe (ZETIA) 10 MG tablet TAKE 1 TABLET BY  MOUTH EVERY DAY 05/05/23   [place], Peter M, MD  gabapentin (NEURONTIN) 300 MG capsule Take 1 capsule (300 mg total) by mouth at bedtime. 04/17/22   Georgina Quint, MD  metFORMIN (GLUCOPHAGE) 500 MG tablet Take 1 tablet (500 mg total) by mouth daily with breakfast. Patient not taking: Reported on 04/22/2023 03/31/23 04/30/23  Quillian Quince D, MD  metoprolol tartrate (LOPRESSOR) 25 MG tablet Take 1 tablet (25 mg total) by mouth 2 (two) times daily. 02/17/23   Corwin Levins, MD  mupirocin ointment (BACTROBAN) 2 % Apply 1 Application topically 2 (two) times daily. 05/21/23   Georgina Quint, MD  nitroGLYCERIN (NITROSTAT) 0.4 MG SL tablet Place 1 tablet (0.4 mg total) under the tongue every 5 (five) minutes as needed for chest pain. 03/25/23   [place], Peter M, MD  pantoprazole (PROTONIX) 40 MG tablet TAKE 1 TABLET BY MOUTH EVERY DAY 04/15/23   Georgina Quint, MD  promethazine-dextromethorphan (PROMETHAZINE-DM) 6.25-15 MG/5ML syrup Take 5 mLs by mouth 4 (four) times daily as needed for cough. 05/03/23   Claiborne Rigg, NP  ramipril (ALTACE) 10 MG capsule TAKE 1 CAPSULE BY MOUTH EVERY DAY 04/03/23   [place], Peter M, MD  rosuvastatin (CRESTOR) 40 MG tablet TAKE 1 TABLET BY MOUTH EVERY DAY 05/05/23   [place], Peter M, MD  Allergies    Erythromycin, Penicillins, Shellfish allergy, and Doxycycline    Review of Systems   Review of Systems  Respiratory:  Positive for cough and shortness of breath.   All other systems reviewed and are negative.   Physical Exam Updated Vital Signs BP (!) 127/55   Pulse 89   Temp 97.8 F (36.6 C) (Oral)   Resp 18   SpO2 98%  Physical Exam Vitals and nursing note reviewed.  Constitutional:      Appearance: She is well-developed.  HENT:     Head: Normocephalic and atraumatic.     Mouth/Throat:     Mouth: Mucous membranes are moist.     Pharynx: Oropharynx is clear.  Eyes:     Extraocular Movements: Extraocular movements intact.     Pupils: Pupils  are equal, round, and reactive to light.  Cardiovascular:     Rate and Rhythm: Normal rate and regular rhythm.  Pulmonary:     Effort: Pulmonary effort is normal.     Breath sounds: Wheezing present.  Abdominal:     General: Bowel sounds are normal.     Palpations: Abdomen is soft.  Musculoskeletal:        General: Normal range of motion.     Cervical back: Normal range of motion and neck supple.  Skin:    General: Skin is warm.     Capillary Refill: Capillary refill takes less than 2 seconds.  Neurological:     General: No focal deficit present.     Mental Status: She is alert and oriented to person, place, and time.  Psychiatric:        Mood and Affect: Mood normal.        Behavior: Behavior normal.     ED Results / Procedures / Treatments   Labs (all labs ordered are listed, but only abnormal results are displayed) Labs Reviewed  RESP PANEL BY RT-PCR (RSV, FLU A&B, COVID)  [id number] - Abnormal; Notable for the following components:      Result Value   SARS Coronavirus 2 by RT PCR POSITIVE (*)    Resp Syncytial Virus by PCR POSITIVE (*)    All other components within normal limits  CBC - Abnormal; Notable for the following components:   RBC 3.83 (*)    HCT 35.5 (*)    All other components within normal limits  COMPREHENSIVE METABOLIC PANEL WITH GFR - Abnormal; Notable for the following components:   Glucose, Bld 115 (*)    All other components within normal limits    EKG None  Radiology CT Angio Chest PE W and/or Wo Contrast Addendum Date: 05/25/2023 ADDENDUM REPORT: 05/25/2023 13:00 ADDENDUM: Critical Value/emergent results were called by telephone at the time of interpretation on 05/25/2023 at 1:00 pm to provider Barney Gertsch , who verbally acknowledged these results. Electronically Signed   By: Thornell Sartorius [name]   On: 05/25/2023 13:00   Result Date: 05/25/2023 CLINICAL DATA:  Pulmonary embolism suspected, high probability. EXAM: CT ANGIOGRAPHY CHEST WITH CONTRAST  TECHNIQUE: Multidetector CT imaging of the chest was performed using the standard protocol during bolus administration of intravenous contrast. Multiplanar CT image reconstructions and MIPs were obtained to evaluate the vascular anatomy. RADIATION DOSE REDUCTION: This exam was performed according to the departmental dose-optimization program which includes automated exposure control, adjustment of the mA and/or kV according to patient size and/or use of iterative reconstruction technique. CONTRAST:  75mL OMNIPAQUE IOHEXOL 350 MG/ML SOLN COMPARISON:  05/20/2023. FINDINGS: Cardiovascular: The heart  is enlarged and there is a trace pericardial effusion. Multi-vessel coronary artery calcifications are noted. There is atherosclerotic calcification of the aorta without evidence of aneurysm. The pulmonary trunk is normal in caliber. Segmental and subsegmental pulmonary emboli are identified in the left upper and lower lobes. There is a subsegmental filling defect in the right upper lobe. There is no evidence of right heart strain. Mediastinum/Nodes: No mediastinal, hilar, or axillary lymphadenopathy by size criteria. The thyroid gland, trachea, and esophagus are within normal limits. Lungs/Pleura: Scattered atelectasis is present bilaterally. No effusion or pneumothorax is seen. No pulmonary nodule or mass. Upper Abdomen: No acute abnormality. Musculoskeletal: No acute osseous abnormality is seen. Review of the MIP images confirms the above findings. IMPRESSION: 1. Segmental and subsegmental pulmonary emboli bilaterally. No evidence of right heart strain. 2. Scattered atelectasis in the lungs bilaterally. 3. Multi-vessel coronary artery calcifications. 4. Aortic atherosclerosis. Electronically Signed: By: Thornell Sartorius [name] On: 05/25/2023 12:50   DG Chest Portable 1 View Result Date: 05/25/2023 CLINICAL DATA:  Shortness of breath. EXAM: PORTABLE CHEST 1 VIEW COMPARISON:  Chest radiograph dated 05/24/2023. FINDINGS:  Stable cardiomediastinal silhouette. Aortic atherosclerosis. No overt edema, focal consolidation, sizeable pleural effusion, or pneumothorax. No acute osseous abnormality. IMPRESSION: No acute cardiopulmonary findings. Electronically Signed   By: Hart Robinsons [name]   On: 05/25/2023 10:36    Procedures Procedures    Medications Ordered in ED Medications  apixaban [facility]) Education Kit for DVT/PE patients (has no administration in time range)  apixaban (ELIQUIS) tablet 10 mg (has no administration in time range)  AeroChamber Plus Flo-Vu Medium MISC 1 each (has no administration in time range)  ipratropium-albuterol (DUONEB) 0.5-2.5 (3) MG/3ML nebulizer solution (3 mLs  Given 05/25/23 1012)  albuterol (PROVENTIL) (2.5 MG/3ML) 0.083% nebulizer solution (2.5 mg  Given 05/25/23 1012)  methylPREDNISolone sodium succinate (SOLU-MEDROL) 125 mg/2 mL injection 125 mg (125 mg Intravenous Given 05/25/23 1039)  acetaminophen (TYLENOL) tablet 650 mg (650 mg Oral Given 05/25/23 1107)  iohexol (OMNIPAQUE) 350 MG/ML injection 75 mL (75 mLs Intravenous Contrast Given 05/25/23 1227)    ED Course/ Medical Decision Making/ A&P                                 Medical Decision Making Amount and/or Complexity of Data Reviewed Labs: ordered. Radiology: ordered.  Risk OTC drugs. Prescription drug management.   This patient presents to the ED for concern of sob, this involves an extensive number of treatment options, and is a complaint that carries with it a high risk of complications and morbidity.  The differential diagnosis includes covid/flu/rsv, pna, PE, electrolyte abn   Co morbidities that complicate the patient evaluation  carotid artery disease, aortic valve stenosis, CAD, GERD, palpitations, skin cancer, copd and hld   Additional history obtained:  Additional history obtained from epic chart review External records from outside source obtained and reviewed including family   Lab Tests:  I  Ordered, and personally interpreted labs.  The pertinent results include:  cbc nl, covid +, RSV +, flu neg; cmp nl   Imaging Studies ordered:  I ordered imaging studies including cxr and ct chest  I independently visualized and interpreted imaging which showed  CXR: No acute cardiopulmonary findings.  CT chest: Segmental and subsegmental pulmonary emboli bilaterally. No  evidence of right heart strain.  2. Scattered atelectasis in the lungs bilaterally.  3. Multi-vessel coronary artery calcifications.  4. Aortic atherosclerosis.  I agree with the radiologist interpretation   Cardiac Monitoring:  The patient was maintained on a cardiac monitor.  I personally viewed and interpreted the cardiac monitored which showed an underlying rhythm of: nsr   Medicines ordered and prescription drug management:  I ordered medication including solumedrol/nebs  for sx  Reevaluation of the patient after these medicines showed that the patient improved I have reviewed the patients home medicines and have made adjustments as needed   Test Considered:  ct   Critical Interventions:  nebs   Problem List / ED Course:  RSV with RAD: wheezing gone with steroids and nebs.  Pt was given an inhaler yest, but no spacer, so she's given one here.  She is d/c with prednisone. Bilateral PE:  no evidence of heart strain.  Pt is able to ambulate without sob and with good oxygen saturations.  Pt started on eliquis.  PE likely from Covid + inactivity.  She is stable for d/c.  Return if worse.  F/u with pcp.   Reevaluation:  After the interventions noted above, I reevaluated the patient and found that they have :improved   Social Determinants of Health:  Lives at home   Dispostion:  After consideration of the diagnostic results and the patients response to treatment, I feel that the patent would benefit from discharge with outpatient f/u.          Final Clinical Impression(s) / ED  Diagnoses Final diagnoses:  RSV (acute bronchiolitis due to respiratory syncytial virus)  Multiple pulmonary emboli (HCC)    Rx / DC Orders ED Discharge Orders          Ordered    apixaban (ELIQUIS) 5 MG TABS tablet        05/25/23 1323    predniSONE (DELTASONE) 50 MG tablet  Daily with breakfast        05/25/23 1323              Jacalyn Lefevre, MD 05/25/23 1327

## 2023-05-25 NOTE — ED Notes (Signed)
 Spoke with MD about Peak flow order. Patient was already given neb, and MD stated PEFR was not needed at this time.

## 2023-05-25 NOTE — ED Notes (Signed)
 SPO2 while ambulating is 96-100% with average heart rate of 92. She tolerated this quite well.

## 2023-05-26 ENCOUNTER — Telehealth: Payer: Self-pay | Admitting: Internal Medicine

## 2023-05-26 ENCOUNTER — Other Ambulatory Visit (HOSPITAL_BASED_OUTPATIENT_CLINIC_OR_DEPARTMENT_OTHER): Payer: Self-pay

## 2023-05-26 ENCOUNTER — Encounter: Payer: PPO | Admitting: Internal Medicine

## 2023-05-26 ENCOUNTER — Other Ambulatory Visit: Payer: Self-pay

## 2023-05-26 ENCOUNTER — Encounter: Payer: Self-pay | Admitting: Cardiology

## 2023-05-26 DIAGNOSIS — I2699 Other pulmonary embolism without acute cor pulmonale: Secondary | ICD-10-CM

## 2023-05-26 NOTE — Telephone Encounter (Signed)
 Good Morning Dr. Leone Payor,  I called this patient at 7:35am to see if she was coming for her procedure she stated she called Friday to let us know.  She has RSV  She will call to reschedule when she is feeling better.

## 2023-05-27 ENCOUNTER — Ambulatory Visit (INDEPENDENT_AMBULATORY_CARE_PROVIDER_SITE_OTHER): Admitting: Emergency Medicine

## 2023-05-27 ENCOUNTER — Other Ambulatory Visit: Payer: Self-pay

## 2023-05-27 ENCOUNTER — Encounter: Payer: Self-pay | Admitting: Emergency Medicine

## 2023-05-27 ENCOUNTER — Emergency Department (HOSPITAL_BASED_OUTPATIENT_CLINIC_OR_DEPARTMENT_OTHER): Admitting: Radiology

## 2023-05-27 ENCOUNTER — Emergency Department (HOSPITAL_BASED_OUTPATIENT_CLINIC_OR_DEPARTMENT_OTHER)
Admission: EM | Admit: 2023-05-27 | Discharge: 2023-05-28 | Disposition: A | Discharge status: Home / Self Care | Attending: Emergency Medicine | Admitting: Emergency Medicine

## 2023-05-27 VITALS — BP 110/58 | HR 53 | Temp 98.0°F | Ht 60.0 in | Wt 164.0 lb

## 2023-05-27 DIAGNOSIS — I2699 Other pulmonary embolism without acute cor pulmonale: Secondary | ICD-10-CM | POA: Insufficient documentation

## 2023-05-27 DIAGNOSIS — R051 Acute cough: Secondary | ICD-10-CM

## 2023-05-27 DIAGNOSIS — I10 Essential (primary) hypertension: Secondary | ICD-10-CM | POA: Insufficient documentation

## 2023-05-27 DIAGNOSIS — Z955 Presence of coronary angioplasty implant and graft: Secondary | ICD-10-CM | POA: Diagnosis not present

## 2023-05-27 DIAGNOSIS — Z96641 Presence of right artificial hip joint: Secondary | ICD-10-CM | POA: Insufficient documentation

## 2023-05-27 DIAGNOSIS — J208 Acute bronchitis due to other specified organisms: Secondary | ICD-10-CM | POA: Insufficient documentation

## 2023-05-27 DIAGNOSIS — K5730 Diverticulosis of large intestine without perforation or abscess without bleeding: Secondary | ICD-10-CM | POA: Diagnosis not present

## 2023-05-27 DIAGNOSIS — J9801 Acute bronchospasm: Secondary | ICD-10-CM | POA: Insufficient documentation

## 2023-05-27 DIAGNOSIS — J205 Acute bronchitis due to respiratory syncytial virus: Secondary | ICD-10-CM | POA: Diagnosis not present

## 2023-05-27 DIAGNOSIS — U071 COVID-19: Secondary | ICD-10-CM | POA: Diagnosis not present

## 2023-05-27 DIAGNOSIS — I2510 Atherosclerotic heart disease of native coronary artery without angina pectoris: Secondary | ICD-10-CM | POA: Diagnosis not present

## 2023-05-27 DIAGNOSIS — R059 Cough, unspecified: Secondary | ICD-10-CM | POA: Diagnosis present

## 2023-05-27 DIAGNOSIS — R0602 Shortness of breath: Secondary | ICD-10-CM | POA: Diagnosis not present

## 2023-05-27 LAB — BASIC METABOLIC PANEL WITH GFR
Anion gap: 10 (ref 5–15)
BUN: 23 mg/dL (ref 8–23)
CO2: 21 mmol/L — ABNORMAL LOW (ref 22–32)
Calcium: 8.6 mg/dL — ABNORMAL LOW (ref 8.9–10.3)
Chloride: 105 mmol/L (ref 98–111)
Creatinine, Ser: 0.93 mg/dL (ref 0.44–1.00)
GFR, Estimated: >60 mL/min (ref >60)
Glucose, Bld: 244 mg/dL — ABNORMAL HIGH (ref 70–99)
Potassium: 3.6 mmol/L (ref 3.5–5.1)
Sodium: 136 mmol/L (ref 135–145)

## 2023-05-27 LAB — TROPONIN I (HIGH SENSITIVITY): Troponin I (High Sensitivity): 6 ng/L (ref <18)

## 2023-05-27 MED ORDER — PROMETHAZINE-DM 6.25-15 MG/5ML PO SYRP
5.0000 mL | ORAL_SOLUTION | Freq: Four times a day (QID) | ORAL | 0 refills | Status: DC | PRN
Start: 2023-05-27 — End: 2023-07-02

## 2023-05-27 MED ORDER — MORPHINE SULFATE (PF) 4 MG/ML IV SOLN
4.0000 mg | Freq: Once | INTRAVENOUS | Status: AC
Start: 2023-05-27 — End: 2023-05-28
  Administered 2023-05-28: 4 mg via INTRAVENOUS
  Filled 2023-05-27: qty 1

## 2023-05-27 MED ORDER — IPRATROPIUM-ALBUTEROL 0.5-2.5 (3) MG/3ML IN SOLN: 3.0000 mL | Freq: Four times a day (QID) | RESPIRATORY_TRACT | 3 refills | Status: DC | PRN

## 2023-05-27 NOTE — Patient Instructions (Signed)
 Blood Clot in the Lung (Pulmonary Embolism): What to Know  A pulmonary embolism (PE) is when you get a sudden blockage or less blood flow in one or both of your lungs. This typically happens when a blood clot moves into the arteries of your lung. A blood clot is blood that has turned into a gel or solid. Most blockages come from deep vein thrombosis (DVT). This is when a blood clot forms in the vein of a leg or arm and moves to your lungs. PE is dangerous. It must be treated right away. What are the causes? In most cases, PE is caused by a blood clot that forms in a vein and moves to your lungs.  In rare cases, it may be caused by: Air. Fat. Part of a tumor. A tumor is a growth of cells or tissue that isn't normal. Other tissue that moves through your veins and into your lungs. What increases the risk? You may be more likely to get PE if: You get really hurt, such as from: Breaking a hip or leg. You're also more at risk if your leg is in a cast or splint. An injury to your spinal cord. You have a big surgery, such as: A hip or knee replacement. Surgery on parts of your nervous system. Surgery on your belly. You have certain conditions, such as: A stroke. A problem with how your blood clots. A long-term lung or heart disease. You have a soft tube called a central venous catheter. You're being treated for cancer. You take medicines that have estrogen in them. You smoke. You spend a lot of time sitting, such as if you travel over 6 hours. You may also be more likely to get PE if you're: Pregnant or just had a baby. Older than 70 years of age. Overweight. Not very active. Not able to move at all. What are the signs or symptoms? Symptoms may start all of a sudden. They may include: Feeling short of breath while moving or resting. Coughing, coughing up blood, or coughing up bloody mucus. Pain that gets worse with deep breaths in your: Chest. Back. Shoulder blade. A heartbeat  that's fast or not normal. Feeling light-headed or dizzy. Fainting. Feeling worried or nervous. Pain and swelling in a leg. This is a symptom of DVT. DVT can lead to PE. How is this diagnosed? PE may be diagnosed based on your medical history, an exam, and tests. Tests may include: Blood tests. An electrocardiogram (ECG). This checks how well your heart is working. A CT pulmonary angiogram. This checks blood flow in and around your lungs. A ventilation-perfusion scan. This measures air flow and blood flow to your lungs. An ultrasound to check for a DVT. How is this treated? Treatment may depend on what caused the PE, how likely you are to have bleeding, and other conditions you have. Treatment may be done to: Stop blood clots from forming. Stop the clot from getting bigger. Break apart the clot. Remove the clot. Treatment may include: Medicines to: Stop clots from forming and growing. You may be given blood thinners. Break apart clots. Procedures, such as: An embolectomy. This uses a tube to remove a clot. Catheter-directed thrombolysis. This uses medicine to get rid of a clot. Surgical embolectomy. This is a surgery to get rid of the clot. It's used in rare cases. You may need more than one treatment. Work with your provider to choose the best treatment program for you. Follow these instructions at home: Medicines  Take your medicines only as told. If you're taking blood thinners: Take your medicines as told. Take them at the same time each day. Talk with your provider before taking any products you can buy at the store. Do not do things that could hurt or bruise you. Be careful to avoid falls. Wear an alert bracelet or carry a card that says you take blood thinners. Know what foods and other medicines you can have with your medicines. General instructions Stay at a healthy weight. Ask your provider what weight is healthy for you. Do not smoke, vape, or use nicotine or  tobacco. Do not sit or lie down for a long time without moving. Tell your provider if you plan to travel. Make sure you can still take your medicine while you travel. Ask what things are safe for you to do at home. Ask when you can go back to work or school. Keep all follow-up visits. Your provider will closely watch how you're doing. Where to find more information To learn more, go to these websites: Centers for Disease Control and Prevention at [employer]. Then: Click Health Topics A-Z. Type "blood clot" into the search box. American Lung Association (ALA): lung.org Contact a health care provider if: You miss a dose of your blood thinner. You have a fever. Get help right away if: You have: New or more pain, swelling, warmth, or redness in an arm or leg. Worse chest pain. A heartbeat that's fast or not normal. A very bad headache. Changes to your eyesight. A very bad fall or accident, or you hit your head. Blood in your pee or poop. A cut that won't stop bleeding. You feel short of breath, and it gets worse when you're moving or resting. You throw up or cough up blood. You feel light-headed or dizzy, and the feeling doesn't go away. You can't move your arms or legs. You're confused or have memory loss. These symptoms may be an emergency. Call 911 right away. Do not wait to see if the symptoms will go away. Do not drive yourself to the hospital. This information is not intended to replace advice given to you by your health care provider. Make sure you discuss any questions you have with your health care provider. Document Revised: 08/27/2022 Document Reviewed: 08/27/2022 Elsevier Patient Education  2024 [employer].

## 2023-05-27 NOTE — Progress Notes (Signed)
 Courtney [name] 73 y.o.   Chief Complaint  Patient presents with   Hospitalization Follow-up    Patient here for ED f/u for COVID / RSV . She is still having coughing and wheezing. She was put on eliquis for 3 months. She will need a rx for the last month. She wants to know who does she see about her continuing the eliquis after the 3 months. She is also wanting a nebulizer for home     HISTORY OF PRESENT ILLNESS: This is a 73 y.o. female A1A here for follow-up of emergency department visit when she presented 2 days ago complaining of cough and wheezing Workup was positive for COVID and RSV infections.  Chest CT scan showed bilateral pulmonary emboli.  Was started on Eliquis. Was given DuoNeb nebulizers and prednisone.  Needs prescription for nebulizer for home use.  HPI   Prior to Admission medications   Medication Sig Start Date End Date Taking? Authorizing Provider  ALPRAZolam (XANAX) 0.25 MG tablet Take 1 tablet (0.25 mg total) by mouth 2 (two) times daily as needed for anxiety. 02/17/23  Yes Corwin Levins, MD  amLODipine (NORVASC) 5 MG tablet Take 1 tablet (5 mg total) by mouth daily. 02/17/23 02/17/24 Yes Corwin Levins, MD  apixaban (ELIQUIS) 5 MG TABS tablet Take 2 tablets (10mg ) twice daily for 7 days, then 1 tablet (5mg ) twice daily 05/25/23  Yes Jacalyn Lefevre, MD  aspirin 81 MG tablet Take 81 mg by mouth daily.   Yes [provider]  benzonatate (TESSALON) 100 MG capsule Take 1 capsule (100 mg total) by mouth 2 (two) times daily as needed for cough. 05/10/23  Yes Claiborne Rigg, NP  clobetasol cream (TEMOVATE) 0.05 % Apply 1-2 times a day to poison oak for 2 weeks 07/02/21  Yes Janalyn Harder, MD  Evolocumab (REPATHA SURECLICK) 140 MG/ML SOAJ Inject 140 mg into the skin every 14 (fourteen) days. 12/04/22  Yes [place], Peter M, MD  ezetimibe (ZETIA) 10 MG tablet TAKE 1 TABLET BY MOUTH EVERY DAY 05/05/23  Yes [place], Peter M, MD  gabapentin (NEURONTIN) 300 MG capsule Take 1  capsule (300 mg total) by mouth at bedtime. 04/17/22  Yes Melesio Madara, Eilleen Kempf, MD  metoprolol tartrate (LOPRESSOR) 25 MG tablet Take 1 tablet (25 mg total) by mouth 2 (two) times daily. 02/17/23  Yes Corwin Levins, MD  mupirocin ointment (BACTROBAN) 2 % Apply 1 Application topically 2 (two) times daily. 05/21/23  Yes Brevon Dewald, Eilleen Kempf, MD  nitroGLYCERIN (NITROSTAT) 0.4 MG SL tablet Place 1 tablet (0.4 mg total) under the tongue every 5 (five) minutes as needed for chest pain. 03/25/23  Yes [place], Peter M, MD  pantoprazole (PROTONIX) 40 MG tablet TAKE 1 TABLET BY MOUTH EVERY DAY 04/15/23  Yes Graelyn Bihl, Eilleen Kempf, MD  predniSONE (DELTASONE) 50 MG tablet Take 1 tablet (50 mg total) by mouth daily with breakfast. 05/25/23  Yes Jacalyn Lefevre, MD  promethazine-dextromethorphan (PROMETHAZINE-DM) 6.25-15 MG/5ML syrup Take 5 mLs by mouth 4 (four) times daily as needed for cough. 05/03/23  Yes Claiborne Rigg, NP  ramipril (ALTACE) 10 MG capsule TAKE 1 CAPSULE BY MOUTH EVERY DAY 04/03/23  Yes [place], Peter M, MD  rosuvastatin (CRESTOR) 40 MG tablet TAKE 1 TABLET BY MOUTH EVERY DAY 05/05/23  Yes [place], Peter M, MD  metFORMIN (GLUCOPHAGE) 500 MG tablet Take 1 tablet (500 mg total) by mouth daily with breakfast. Patient not taking: Reported on 04/22/2023 03/31/23 04/30/23  Wilder Glade, MD  Allergies  Allergen Reactions   Erythromycin Hives   Penicillins Hives    Has patient had a PCN reaction causing immediate rash, facial/tongue/throat swelling, SOB or lightheadedness with hypotension: No Has patient had a PCN reaction causing severe rash involving mucus membranes or skin necrosis: No Has patient had a PCN reaction that required hospitalization: No Has patient had a PCN reaction occurring within the last 10 years: No If all of the above answers are "NO", then may proceed with Cephalosporin use.    Shellfish Allergy Hives   Doxycycline Rash    Patient Active Problem List   Diagnosis Date Noted    Adjustment reaction with anxiety and depression 10/16/2022   Mixed hyperlipidemia 09/17/2022   BMI 31.0-31.9,adult 03/26/2022   Obesity, Beginning BMI 30.66 03/26/2022   Insulin resistance 02/26/2022   Chronic pain of right knee 10/15/2021   Pre-diabetes 07/11/2021   Degenerative disc disease, lumbar 05/19/2020   Restless leg syndrome 05/15/2020   Degenerative disc disease, cervical 02/02/2020   Polyarthralgia 01/03/2020   Patellofemoral arthritis of right knee 06/16/2019   Vitamin D deficiency, Vit D = 43.2 (01/25/19), Rx vit D 50K IU every 2 weeks 08/04/2018   Cyst of right breast 05/07/2018   Chronic headache disorder 02/08/2018   Essential hypertension 11/17/2017   Class 1 obesity with serious comorbidity and body mass index (BMI) of 30.0 to 30.9 in adult 07/15/2017   Insomnia 07/15/2017   Female stress incontinence 07/15/2017   Anxiety 07/15/2017   Gastroesophageal reflux disease with esophagitis, Rx Protonix 02/25/2017   Osteopenia 06/27/2015   Osteoarthritis of right hip 07/08/2014   Carotid artery disease (HCC)    Aortic valve sclerosis    Dyslipidemia    Coronary artery disease     Past Medical History:  Diagnosis Date   Aortic valve sclerosis    echo 12/2008, soft systolic murmur   Arthritis    in right hip   Back pain    Basal cell carcinoma 05/31/2016   bcc left forehead  tx exc   Carotid artery disease (HCC)    40-59% bilateral, doppler December 2010   Cataract    Constipation    Coronary artery disease    a. BMS-mid RCA 11/2007 b. stress echo 02/2009 subtle inferior HK, lateral ST depressions with stress c. follow-up cath 02/2009: RCA stent patent, severe but stable stenosis of distal posterior lateral branch RCA. LV normal, med tx unless more sx c. ETT Myoview 08/27/12: negative for ischemia; EF 64%   Decreased hearing    Depression    Mild, August, 2012   Dyslipidemia    Easy bruising    Ejection fraction    EF normal, catheterization, 2011 /  EF 55%,  echo, 2010   Emphysema of lung (HCC)    Fluid overload    Mild, stable since hospitalization in the past   GERD (gastroesophageal reflux disease)    occarionally, takes Pepcid over the counter   Heart murmur    has, occasional PVC's   History of right hip replacement    Hyperlipidemia    Hypertension    Joint pain    Myocardial infarction (HCC)    2009   Palpitations    PVC (premature ventricular contraction)    per prior Holter monitor   Right hip pain 12/2009   Shellfish allergy    Stress fracture    Left foot   Trouble in sleeping    Vitamin B12 deficiency    Vitamin D deficiency  Past Surgical History:  Procedure Laterality Date   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/01/2005   BREAST BIOPSY Left    carcinoid tumor removal     colon-   CARDIAC CATHETERIZATION  1/211   Patent RCA stent, stenotic PLB supplying small distribution; medically managed   CATARACT EXTRACTION, BILATERAL     CORONARY ANGIOPLASTY WITH STENT PLACEMENT  11/2007   BMS-mid RCA   DIAGNOSTIC LAPAROSCOPY     1982 for endometriosis   DILATION AND CURETTAGE OF UTERUS  2005   SKIN CANCER EXCISION     TOTAL HIP ARTHROPLASTY Right 07/08/2014   Procedure: RIGHT TOTAL HIP ARTHROPLASTY ANTERIOR APPROACH;  Surgeon: Kathryne Hitch, MD;  Location: [facility];  Service: Orthopedics;  Laterality: Right;   WISDOM TOOTH EXTRACTION      Social History   Socioeconomic History   Marital status: Divorced    Spouse name: Not on file   Number of children: 1   Years of education: 38   Highest education level: Master's degree (e.g., MA, MS, MEng, MEd, MSW, MBA)  Occupational History   Occupation: Retired - Publishing copy  Tobacco Use   Smoking status: Never   Smokeless tobacco: Never  Vaping Use   Vaping status: Never Used  Substance and Sexual Activity   Alcohol use: Yes    Comment: 1 glass of wine 3x/week   Drug use: No   Sexual activity: Not Currently    Partners: Male    Comment: divorced  Other Topics  Concern   Not on file  Social History Narrative   Right handed   Soda- sometimes   Drinks herbal tea   Fun: Dance, garden,    Denies religious beliefs effecting health care.   Denies abuse and feels safe at home   Social Drivers of Health   Financial Resource Strain: Low Risk  (02/24/2023)   Overall Financial Resource Strain (CARDIA)    Difficulty of Paying Living Expenses: Not hard at all  Food Insecurity: No Food Insecurity (02/24/2023)   Hunger Vital Sign    Worried About Running Out of Food in the Last Year: Never true    Ran Out of Food in the Last Year: Never true  Transportation Needs: No Transportation Needs (02/24/2023)   PRAPARE - Administrator, Civil Service (Medical): No    Lack of Transportation (Non-Medical): No  Physical Activity: Insufficiently Active (02/24/2023)   Exercise Vital Sign    Days of Exercise per Week: 2 days    Minutes of Exercise per Session: 20 min  Stress: Stress Concern Present (02/24/2023)   [employer] of Occupational Health - Occupational Stress Questionnaire    Feeling of Stress : Rather much  Social Connections: Moderately Integrated (02/24/2023)   Social Connection and Isolation Panel [NHANES]    Frequency of Communication with Friends and Family: Three times a week    Frequency of Social Gatherings with Friends and Family: Once a week    Attends Religious Services: 1 to 4 times per year    Active Member of [employer] or Organizations: No    Attends Banker Meetings: Never    Marital Status: Living with partner  Recent Concern: Social Connections - Moderately Isolated (01/27/2023)   Social Connection and Isolation Panel [NHANES]    Frequency of Communication with Friends and Family: More than three times a week    Frequency of Social Gatherings with Friends and Family: Twice a week    Attends Religious Services: Never  Active Member of Clubs or Organizations: Yes    Attends Banker Meetings: Never     Marital Status: Divorced  Catering manager Violence: Not At Risk (01/27/2023)   Humiliation, Afraid, Rape, and Kick questionnaire    Fear of Current or Ex-Partner: No    Emotionally Abused: No    Physically Abused: No    Sexually Abused: No    Family History  Problem Relation Age of Onset   Stroke Mother    Hyperlipidemia Mother    Hypertension Mother    Heart disease Mother    Obesity Mother    Heart attack Father 59   Hyperlipidemia Father    Hypertension Father    Sudden death Father    Heart disease Sister    Heart attack Paternal Grandfather    BRCA 1/2 Neg Hx    Breast cancer Neg Hx    Esophageal cancer Neg Hx    Colon cancer Neg Hx    Pancreatic cancer Neg Hx    Liver disease Neg Hx    Stomach cancer Neg Hx    Colon polyps Neg Hx    Rectal cancer Neg Hx      Review of Systems  Constitutional: Negative.  Negative for chills and fever.  HENT:  Positive for congestion. Negative for sore throat.   Respiratory:  Positive for cough, shortness of breath and wheezing. Negative for hemoptysis and sputum production.   Cardiovascular: Negative.  Negative for chest pain and palpitations.  Gastrointestinal:  Negative for abdominal pain, diarrhea, nausea and vomiting.  Genitourinary: Negative.  Negative for dysuria and hematuria.  Skin: Negative.  Negative for rash.  Neurological: Negative.  Negative for dizziness and headaches.  All other systems reviewed and are negative.   Vitals:   05/27/23 1034  BP: (!) 110/58  Pulse: (!) 53  Temp: 98 F (36.7 C)  SpO2: 95%    Physical Exam Vitals reviewed.  Constitutional:      Appearance: Normal appearance.  HENT:     Head: Normocephalic.     Mouth/Throat:     Mouth: Mucous membranes are moist.     Pharynx: Oropharynx is clear.  Eyes:     Extraocular Movements: Extraocular movements intact.     Conjunctiva/sclera: Conjunctivae normal.     Pupils: Pupils are equal, round, and reactive to light.  Cardiovascular:      Rate and Rhythm: Normal rate and regular rhythm.     Pulses: Normal pulses.     Heart sounds: Normal heart sounds.  Pulmonary:     Effort: Pulmonary effort is normal.     Breath sounds: Wheezing and rhonchi present.  Musculoskeletal:     Cervical back: No tenderness.  Lymphadenopathy:     Cervical: No cervical adenopathy.  Skin:    General: Skin is warm and dry.     Capillary Refill: Capillary refill takes less than 2 seconds.  Neurological:     General: No focal deficit present.     Mental Status: She is alert and oriented to person, place, and time.  Psychiatric:        Mood and Affect: Mood normal.        Behavior: Behavior normal.      ASSESSMENT & PLAN: A total of 47 minutes was spent with the patient and counseling/coordination of care regarding preparing for this visit, review of most recent office visit notes, review of most recent emergency department visit notes, review of most recent CT chest and chest x-ray  imaging reports, review of most recent blood work results, diagnosis of COVID and RSV infection, symptom management, diagnosis of bilateral pulmonary embolism and need anticoagulant medication, need for follow-up with pulmonary, prognosis, ED precautions, documentation and need for follow-up.  Problem List Items Addressed This Visit       Cardiovascular and Mediastinum   Essential hypertension   BP Readings from Last 3 Encounters:  05/27/23 (!) 110/58  05/25/23 (!) 141/75  05/20/23 (!) 103/45  Well-controlled hypertension with normal blood pressure readings at home Recommend to continue amlodipine 5 mg daily, metoprolol tartrate 25 mg twice a day, and Altace 10 mg daily Cardiovascular risks associated with hypertension discussed Diet and nutrition discussed Situational anxiety contributing to hypertension episodes Stress management discussed       Pulmonary embolus (HCC) - Primary   Segmental and subsegmental pulmonary emboli bilaterally. No evidence of  right heart strain Clinically stable ED precautions given Started on Eliquis bid Recommend pulmonary evaluation Referral placed today. Chest x-ray and CT chest report reviewed with patient Emergency department visit notes reviewed with patient      Relevant Orders   For home use only DME Nebulizer machine     Respiratory   Acute bronchitis due to COVID-19 virus   Stable Symptom management discussed.      Relevant Medications   promethazine-dextromethorphan (PROMETHAZINE-DM) 6.25-15 MG/5ML syrup   ipratropium-albuterol (DUONEB) 0.5-2.5 (3) MG/3ML SOLN   Other Relevant Orders   For home use only DME Nebulizer machine   Ambulatory referral to Pulmonology   RSV bronchitis   Stable.  Symptom management discussed. Bronchospasm on physical examination Continue prednisone for 5 days Continue DuoNeb nebulizers at home      Relevant Medications   promethazine-dextromethorphan (PROMETHAZINE-DM) 6.25-15 MG/5ML syrup   ipratropium-albuterol (DUONEB) 0.5-2.5 (3) MG/3ML SOLN   Other Relevant Orders   For home use only DME Nebulizer machine   Ambulatory referral to Pulmonology     Other   Bronchospasm   Symptom management discussed Advised to rest and stay well-hydrated Continue oral daily corticosteroid Continue DuoNeb nebulizers every 6 hours as needed      Relevant Medications   ipratropium-albuterol (DUONEB) 0.5-2.5 (3) MG/3ML SOLN   Other Relevant Orders   For home use only DME Nebulizer machine   Ambulatory referral to Pulmonology   Patient Instructions  Blood Clot in the Lung (Pulmonary Embolism): What to Know  A pulmonary embolism (PE) is when you get a sudden blockage or less blood flow in one or both of your lungs. This typically happens when a blood clot moves into the arteries of your lung. A blood clot is blood that has turned into a gel or solid. Most blockages come from deep vein thrombosis (DVT). This is when a blood clot forms in the vein of a leg or arm and  moves to your lungs. PE is dangerous. It must be treated right away. What are the causes? In most cases, PE is caused by a blood clot that forms in a vein and moves to your lungs.  In rare cases, it may be caused by: Air. Fat. Part of a tumor. A tumor is a growth of cells or tissue that isn't normal. Other tissue that moves through your veins and into your lungs. What increases the risk? You may be more likely to get PE if: You get really hurt, such as from: Breaking a hip or leg. You're also more at risk if your leg is in a cast or splint. An injury  to your spinal cord. You have a big surgery, such as: A hip or knee replacement. Surgery on parts of your nervous system. Surgery on your belly. You have certain conditions, such as: A stroke. A problem with how your blood clots. A long-term lung or heart disease. You have a soft tube called a central venous catheter. You're being treated for cancer. You take medicines that have estrogen in them. You smoke. You spend a lot of time sitting, such as if you travel over 6 hours. You may also be more likely to get PE if you're: Pregnant or just had a baby. Older than 69 years of age. Overweight. Not very active. Not able to move at all. What are the signs or symptoms? Symptoms may start all of a sudden. They may include: Feeling short of breath while moving or resting. Coughing, coughing up blood, or coughing up bloody mucus. Pain that gets worse with deep breaths in your: Chest. Back. Shoulder blade. A heartbeat that's fast or not normal. Feeling light-headed or dizzy. Fainting. Feeling worried or nervous. Pain and swelling in a leg. This is a symptom of DVT. DVT can lead to PE. How is this diagnosed? PE may be diagnosed based on your medical history, an exam, and tests. Tests may include: Blood tests. An electrocardiogram (ECG). This checks how well your heart is working. A CT pulmonary angiogram. This checks blood flow in  and around your lungs. A ventilation-perfusion scan. This measures air flow and blood flow to your lungs. An ultrasound to check for a DVT. How is this treated? Treatment may depend on what caused the PE, how likely you are to have bleeding, and other conditions you have. Treatment may be done to: Stop blood clots from forming. Stop the clot from getting bigger. Break apart the clot. Remove the clot. Treatment may include: Medicines to: Stop clots from forming and growing. You may be given blood thinners. Break apart clots. Procedures, such as: An embolectomy. This uses a tube to remove a clot. Catheter-directed thrombolysis. This uses medicine to get rid of a clot. Surgical embolectomy. This is a surgery to get rid of the clot. It's used in rare cases. You may need more than one treatment. Work with your provider to choose the best treatment program for you. Follow these instructions at home: Medicines Take your medicines only as told. If you're taking blood thinners: Take your medicines as told. Take them at the same time each day. Talk with your provider before taking any products you can buy at the store. Do not do things that could hurt or bruise you. Be careful to avoid falls. Wear an alert bracelet or carry a card that says you take blood thinners. Know what foods and other medicines you can have with your medicines. General instructions Stay at a healthy weight. Ask your provider what weight is healthy for you. Do not smoke, vape, or use nicotine or tobacco. Do not sit or lie down for a long time without moving. Tell your provider if you plan to travel. Make sure you can still take your medicine while you travel. Ask what things are safe for you to do at home. Ask when you can go back to work or school. Keep all follow-up visits. Your provider will closely watch how you're doing. Where to find more information To learn more, go to these websites: Centers for Disease  Control and Prevention at [employer].no. Then: Click Health Topics A-Z. Type "blood clot" into the search  box. American Lung Association (ALA): lung.org Contact a health care provider if: You miss a dose of your blood thinner. You have a fever. Get help right away if: You have: New or more pain, swelling, warmth, or redness in an arm or leg. Worse chest pain. A heartbeat that's fast or not normal. A very bad headache. Changes to your eyesight. A very bad fall or accident, or you hit your head. Blood in your pee or poop. A cut that won't stop bleeding. You feel short of breath, and it gets worse when you're moving or resting. You throw up or cough up blood. You feel light-headed or dizzy, and the feeling doesn't go away. You can't move your arms or legs. You're confused or have memory loss. These symptoms may be an emergency. Call 911 right away. Do not wait to see if the symptoms will go away. Do not drive yourself to the hospital. This information is not intended to replace advice given to you by your health care provider. Make sure you discuss any questions you have with your health care provider. Document Revised: 08/27/2022 Document Reviewed: 08/27/2022 Elsevier Patient Education  2024 Elsevier Inc.      Edwina Barth, MD [facility]

## 2023-05-27 NOTE — Assessment & Plan Note (Signed)
 Symptom management discussed Advised to rest and stay well-hydrated Continue oral daily corticosteroid Continue DuoNeb nebulizers every 6 hours as needed

## 2023-05-27 NOTE — ED Triage Notes (Addendum)
 Sunday PE-eliquis and RSV+COVID. Pulmonary consult today at PCP- sent home with albuterol and neb machine. Coughing all day. Tightness in chest and SOB today. Currently taking several cough suppressants as well.

## 2023-05-27 NOTE — Assessment & Plan Note (Signed)
 Stable Symptom management discussed.

## 2023-05-27 NOTE — Assessment & Plan Note (Signed)
 BP Readings from Last 3 Encounters:  05/27/23 (!) 110/58  05/25/23 (!) 141/75  05/20/23 (!) 103/45  Well-controlled hypertension with normal blood pressure readings at home Recommend to continue amlodipine 5 mg daily, metoprolol tartrate 25 mg twice a day, and Altace 10 mg daily Cardiovascular risks associated with hypertension discussed Diet and nutrition discussed Situational anxiety contributing to hypertension episodes Stress management discussed

## 2023-05-27 NOTE — Assessment & Plan Note (Signed)
 Stable.  Symptom management discussed. Bronchospasm on physical examination Continue prednisone for 5 days Continue DuoNeb nebulizers at home

## 2023-05-27 NOTE — Assessment & Plan Note (Addendum)
 Segmental and subsegmental pulmonary emboli bilaterally. No evidence of right heart strain Clinically stable ED precautions given Started on Eliquis bid Recommend pulmonary evaluation Referral placed today. Chest x-ray and CT chest report reviewed with patient Emergency department visit notes reviewed with patient

## 2023-05-28 ENCOUNTER — Encounter (HOSPITAL_BASED_OUTPATIENT_CLINIC_OR_DEPARTMENT_OTHER): Payer: Self-pay

## 2023-05-28 ENCOUNTER — Ambulatory Visit

## 2023-05-28 ENCOUNTER — Emergency Department (HOSPITAL_BASED_OUTPATIENT_CLINIC_OR_DEPARTMENT_OTHER): Admitting: Radiology

## 2023-05-28 ENCOUNTER — Ambulatory Visit (INDEPENDENT_AMBULATORY_CARE_PROVIDER_SITE_OTHER): Admitting: Family Medicine

## 2023-05-28 ENCOUNTER — Emergency Department (HOSPITAL_BASED_OUTPATIENT_CLINIC_OR_DEPARTMENT_OTHER)

## 2023-05-28 ENCOUNTER — Other Ambulatory Visit (HOSPITAL_BASED_OUTPATIENT_CLINIC_OR_DEPARTMENT_OTHER)

## 2023-05-28 DIAGNOSIS — K5730 Diverticulosis of large intestine without perforation or abscess without bleeding: Secondary | ICD-10-CM | POA: Diagnosis not present

## 2023-05-28 DIAGNOSIS — I2510 Atherosclerotic heart disease of native coronary artery without angina pectoris: Secondary | ICD-10-CM | POA: Diagnosis not present

## 2023-05-28 DIAGNOSIS — R0600 Dyspnea, unspecified: Secondary | ICD-10-CM | POA: Diagnosis not present

## 2023-05-28 DIAGNOSIS — R918 Other nonspecific abnormal finding of lung field: Secondary | ICD-10-CM | POA: Diagnosis not present

## 2023-05-28 LAB — CBC
HCT: 37 % (ref 36.0–46.0)
Hemoglobin: 12.4 g/dL (ref 12.0–15.0)
MCH: 31.6 pg (ref 26.0–34.0)
MCHC: 33.5 g/dL (ref 30.0–36.0)
MCV: 94.1 fL (ref 80.0–100.0)
Platelets: 182 10*3/uL (ref 150–400)
RBC: 3.93 MIL/uL (ref 3.87–5.11)
RDW: 12.4 % (ref 11.5–15.5)
WBC: 8.4 10*3/uL (ref 4.0–10.5)
nRBC: 0 % (ref 0.0–0.2)

## 2023-05-28 LAB — TROPONIN I (HIGH SENSITIVITY): Troponin I (High Sensitivity): 13 ng/L (ref <18)

## 2023-05-28 MED ORDER — METHOCARBAMOL 500 MG PO TABS: 500.0000 mg | ORAL_TABLET | Freq: Three times a day (TID) | ORAL | 0 refills | Status: DC | PRN

## 2023-05-28 MED ORDER — HYDROMORPHONE HCL 1 MG/ML IJ SOLN
1.0000 mg | Freq: Once | INTRAMUSCULAR | Status: AC
  Administered 2023-05-28: 1 mg via INTRAVENOUS
  Filled 2023-05-28: qty 1

## 2023-05-28 MED ORDER — OXYCODONE HCL 5 MG PO TABS: 5.0000 mg | ORAL_TABLET | ORAL | 0 refills | Status: DC | PRN

## 2023-05-28 MED ORDER — IOHEXOL 350 MG/ML SOLN
100.0000 mL | Freq: Once | INTRAVENOUS | Status: AC | PRN
  Administered 2023-05-28: 100 mL via INTRAVENOUS

## 2023-05-28 NOTE — ED Provider Notes (Signed)
 [facility] Emergency Department Provider Note MRN:  [id number]  Arrival date & time: 05/28/23     Chief Complaint   Flank pain History of Present Illness   Courtney [name] is a 73 y.o. year-old female with a history of CAD presenting to the ED with chief complaint of flank pain.  Patient was recently diagnosed with pulmonary emboli and started on anticoagulation.  She has been having persistent cough for a few weeks, developed some pain and shortness of breath and so the blood clots were diagnosed in the emergency department.  She was also diagnosed with RSV infection.  She was overall doing well until today when she was drinking tea and suddenly developed a coughing spell.  Could not really breathe during this coughing spell and this concerned her.  During the coughing spell she developed severe left-sided flank pain that has not gone away, much worse with coughing.  Review of Systems  A thorough review of systems was obtained and all systems are negative except as noted in the HPI and PMH.   Patient's Health History    Past Medical History:  Diagnosis Date   Aortic valve sclerosis    echo 12/2008, soft systolic murmur   Arthritis    in right hip   Back pain    Basal cell carcinoma 05/31/2016   bcc left forehead  tx exc   Carotid artery disease (HCC)    40-59% bilateral, doppler December 2010   Cataract    Constipation    Coronary artery disease    a. BMS-mid RCA 11/2007 b. stress echo 02/2009 subtle inferior HK, lateral ST depressions with stress c. follow-up cath 02/2009: RCA stent patent, severe but stable stenosis of distal posterior lateral branch RCA. LV normal, med tx unless more sx c. ETT Myoview 08/27/12: negative for ischemia; EF 64%   Decreased hearing    Depression    Mild, August, 2012   Dyslipidemia    Easy bruising    Ejection fraction    EF normal, catheterization, 2011 /  EF 55%, echo, 2010   Emphysema of lung (HCC)    Fluid overload     Mild, stable since hospitalization in the past   GERD (gastroesophageal reflux disease)    occarionally, takes Pepcid over the counter   Heart murmur    has, occasional PVC's   History of right hip replacement    Hyperlipidemia    Hypertension    Joint pain    Myocardial infarction (HCC)    2009   Palpitations    PVC (premature ventricular contraction)    per prior Holter monitor   Right hip pain 12/2009   Shellfish allergy    Stress fracture    Left foot   Trouble in sleeping    Vitamin B12 deficiency    Vitamin D deficiency     Past Surgical History:  Procedure Laterality Date   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/01/2005   BREAST BIOPSY Left    carcinoid tumor removal     colon-   CARDIAC CATHETERIZATION  1/211   Patent RCA stent, stenotic PLB supplying small distribution; medically managed   CATARACT EXTRACTION, BILATERAL     CORONARY ANGIOPLASTY WITH STENT PLACEMENT  11/2007   BMS-mid RCA   DIAGNOSTIC LAPAROSCOPY     1982 for endometriosis   DILATION AND CURETTAGE OF UTERUS  2005   SKIN CANCER EXCISION     TOTAL HIP ARTHROPLASTY Right 07/08/2014   Procedure: RIGHT TOTAL HIP ARTHROPLASTY  ANTERIOR APPROACH;  Surgeon: Kathryne Hitch, MD;  Location: [facility];  Service: Orthopedics;  Laterality: Right;   WISDOM TOOTH EXTRACTION      Family History  Problem Relation Age of Onset   Stroke Mother    Hyperlipidemia Mother    Hypertension Mother    Heart disease Mother    Obesity Mother    Heart attack Father 41   Hyperlipidemia Father    Hypertension Father    Sudden death Father    Heart disease Sister    Heart attack Paternal Grandfather    BRCA 1/2 Neg Hx    Breast cancer Neg Hx    Esophageal cancer Neg Hx    Colon cancer Neg Hx    Pancreatic cancer Neg Hx    Liver disease Neg Hx    Stomach cancer Neg Hx    Colon polyps Neg Hx    Rectal cancer Neg Hx     Social History   Socioeconomic History   Marital status: Divorced    Spouse name: Not on file   Number  of children: 1   Years of education: 18   Highest education level: Master's degree (e.g., MA, MS, MEng, MEd, MSW, MBA)  Occupational History   Occupation: Retired - Publishing copy  Tobacco Use   Smoking status: Never   Smokeless tobacco: Never  Vaping Use   Vaping status: Never Used  Substance and Sexual Activity   Alcohol use: Yes    Comment: 1 glass of wine 3x/week   Drug use: No   Sexual activity: Not Currently    Partners: Male    Comment: divorced  Other Topics Concern   Not on file  Social History Narrative   Right handed   Soda- sometimes   Drinks herbal tea   Fun: Dance, garden,    Denies religious beliefs effecting health care.   Denies abuse and feels safe at home   Social Drivers of Health   Financial Resource Strain: Low Risk  (02/24/2023)   Overall Financial Resource Strain (CARDIA)    Difficulty of Paying Living Expenses: Not hard at all  Food Insecurity: No Food Insecurity (02/24/2023)   Hunger Vital Sign    Worried About Running Out of Food in the Last Year: Never true    Ran Out of Food in the Last Year: Never true  Transportation Needs: No Transportation Needs (02/24/2023)   PRAPARE - Administrator, Civil Service (Medical): No    Lack of Transportation (Non-Medical): No  Physical Activity: Insufficiently Active (02/24/2023)   Exercise Vital Sign    Days of Exercise per Week: 2 days    Minutes of Exercise per Session: 20 min  Stress: Stress Concern Present (02/24/2023)   [employer] of Occupational Health - Occupational Stress Questionnaire    Feeling of Stress : Rather much  Social Connections: Moderately Integrated (02/24/2023)   Social Connection and Isolation Panel [NHANES]    Frequency of Communication with Friends and Family: Three times a week    Frequency of Social Gatherings with Friends and Family: Once a week    Attends Religious Services: 1 to 4 times per year    Active Member of [employer] or Organizations: No    Attends Tax inspector Meetings: Never    Marital Status: Living with partner  Recent Concern: Social Connections - Moderately Isolated (01/27/2023)   Social Connection and Isolation Panel [NHANES]    Frequency of Communication with Friends and Family: More than  three times a week    Frequency of Social Gatherings with Friends and Family: Twice a week    Attends Religious Services: Never    Database administrator or Organizations: Yes    Attends Banker Meetings: Never    Marital Status: Divorced  Catering manager Violence: Not At Risk (01/27/2023)   Humiliation, Afraid, Rape, and Kick questionnaire    Fear of Current or Ex-Partner: No    Emotionally Abused: No    Physically Abused: No    Sexually Abused: No     Physical Exam   Vitals:   05/28/23 0415 05/28/23 0430  BP: (!) 142/66   Pulse: 62   Resp: 17 20  Temp: 98.2 F (36.8 C)   SpO2: 96%     CONSTITUTIONAL: Well-appearing, NAD NEURO/PSYCH:  Alert and oriented x 3, no focal deficits EYES:  eyes equal and reactive ENT/NECK:  no LAD, no JVD CARDIO: Regular rate, well-perfused, normal S1 and S2 PULM:  CTAB no wheezing or rhonchi GI/GU:  non-distended, non-tender MSK/SPINE:  No gross deformities, no edema SKIN:  no rash, atraumatic   *Additional and/or pertinent findings included in MDM below  Diagnostic and Interventional Summary    EKG Interpretation Date/Time:  Tuesday May 27 2023 23:09:57 EDT Ventricular Rate:  69 PR Interval:  154 QRS Duration:  88 QT Interval:  394 QTC Calculation: 422 R Axis:   -5  Text Interpretation: Normal sinus rhythm Moderate voltage criteria for LVH, may be normal variant ( R in aVL , Cornell product ) Nonspecific ST abnormality Abnormal ECG When compared with ECG of 25-May-2023 10:03, PREVIOUS ECG IS PRESENT Confirmed by Kennis Carina [phone]) on 05/28/2023 4:36:27 AM       Labs Reviewed  BASIC METABOLIC PANEL WITH GFR - Abnormal; Notable for the following components:       Result Value   CO2 21 (*)    Glucose, Bld 244 (*)    Calcium 8.6 (*)    All other components within normal limits  CBC  TROPONIN I (HIGH SENSITIVITY)  TROPONIN I (HIGH SENSITIVITY)    DG Chest 2 View  Final Result    CT Angio Chest Pulmonary Embolism (PE) W or WO Contrast  Final Result    CT ABDOMEN PELVIS W CONTRAST  Final Result      Medications  morphine (PF) 4 MG/ML injection 4 mg (4 mg Intravenous Given 05/28/23 0023)  iohexol (OMNIPAQUE) 350 MG/ML injection 100 mL (100 mLs Intravenous Contrast Given 05/28/23 0006)  HYDROmorphone (DILAUDID) injection 1 mg (1 mg Intravenous Given 05/28/23 0217)     Procedures  /  Critical Care Procedures  ED Course and Medical Decision Making  Initial Impression and Ddx Differential diagnosis includes worsening or increased clot burden related to recent PE, possibly the clot burden is the same but patient is developing worsening symptomatology that would warrant admission.  Reportedly her oxygen saturation was down to the upper 80s at home.  Another concern would be a retroperitoneal hematoma or hemorrhage in the setting of no anticoagulation given the flank pain.  Will need repeat CT imaging, symptomatic control.  Past medical/surgical history that increases complexity of ED encounter: Recent PE  Interpretation of Diagnostics I personally reviewed the EKG and my interpretation is as follows: Sinus rhythm, nonspecific findings  No significant blood count or electrolyte disturbance.  Troponin negative x 2  Patient Reassessment and Ultimate Disposition/Management     CTA and CT abdominal study are unremarkable, no significant pathology.  Patient's symptoms are better controlled, we discussed the pros and cons of admission versus home with improved pain regimen.  Admission offered, patient feels comfortable going home.  Patient management required discussion with the following services or consulting groups:  None  Complexity of Problems  Addressed Acute illness or injury that poses threat of life of bodily function  Additional Data Reviewed and Analyzed Further history obtained from: Further history from spouse/family member  Additional Factors Impacting ED Encounter Risk Prescriptions and Consideration of hospitalization  Elmer Sow. Pilar Plate, MD [facility] Emergency Medicine [facility] [email or web address]  Final Clinical Impressions(s) / ED Diagnoses     ICD-10-CM   1. Acute cough  R05.1     2. Other acute pulmonary embolism without acute cor pulmonale (HCC)  I26.99       ED Discharge Orders          Ordered    oxyCODONE (ROXICODONE) 5 MG immediate release tablet  Every 4 hours PRN        05/28/23 0434    methocarbamol (ROBAXIN) 500 MG tablet  Every 8 hours PRN        05/28/23 0434             Discharge Instructions Discussed with and Provided to Patient:     Discharge Instructions      You were evaluated in the Emergency Department and after careful evaluation, we did not find any emergent condition requiring admission or further testing in the hospital.  Your exam/testing today is overall reassuring.  Symptoms likely related to the recent blood clot.  CTs did not show any significant changes.  Plan is for improved pain control at home.  Continue Tylenol 1000 mg every 4-6 hours for pain.  Continue numbing patches, Tessalon, inhaler as needed.  Can use the oxycodone for more significant pain.  Can also try the muscle relaxer to see if this helps.  Important that you do not mix the oxycodone and the Robaxin as we discussed.  Very important that you avoid falls, especially while on the blood thinners. These sedating medications can increase your risk of falls and so please be careful.  Please return to the Emergency Department if you experience any worsening of your condition.   Thank you for allowing [place] to be a part of your care.       Sabas Sous, MD 05/28/23 [phone]

## 2023-05-28 NOTE — Discharge Instructions (Signed)
 You were evaluated in the Emergency Department and after careful evaluation, we did not find any emergent condition requiring admission or further testing in the hospital.  Your exam/testing today is overall reassuring.  Symptoms likely related to the recent blood clot.  CTs did not show any significant changes.  Plan is for improved pain control at home.  Continue Tylenol 1000 mg every 4-6 hours for pain.  Continue numbing patches, Tessalon, inhaler as needed.  Can use the oxycodone for more significant pain.  Can also try the muscle relaxer to see if this helps.  Important that you do not mix the oxycodone and the Robaxin as we discussed.  Very important that you avoid falls, especially while on the blood thinners. These sedating medications can increase your risk of falls and so please be careful.  Please return to the Emergency Department if you experience any worsening of your condition.   Thank you for allowing [place] to be a part of your care.

## 2023-05-29 ENCOUNTER — Telehealth: Payer: Self-pay | Admitting: Cardiology

## 2023-05-29 ENCOUNTER — Encounter: Payer: Self-pay | Admitting: Cardiology

## 2023-05-29 NOTE — Telephone Encounter (Signed)
 Pt requesting cb to discuss results rec'vd thru mychart-EKG done

## 2023-05-29 NOTE — Telephone Encounter (Signed)
Returned call to patient left message on personal voice mail to call back. 

## 2023-05-29 NOTE — Telephone Encounter (Signed)
 Spoke to patient she stated she has been to ED twice.Stated she is concerned EKG done on 4/6 looks different than EKG done on 4/8.Dr.Jordan out of office this afternoon.Dr.Tobb reviewed both EKG's she advised both are ok.  She wanted Dr.Jordan to review ED visits.She is scheduled to have echo on 4/12.I will send message to Dr.Jordan.

## 2023-06-02 NOTE — Telephone Encounter (Signed)
 Spoke to patient Dr.Jordan's advice given.

## 2023-06-20 ENCOUNTER — Other Ambulatory Visit: Payer: Self-pay | Admitting: Emergency Medicine

## 2023-06-20 ENCOUNTER — Encounter: Payer: Self-pay | Admitting: Emergency Medicine

## 2023-06-20 DIAGNOSIS — G8929 Other chronic pain: Secondary | ICD-10-CM

## 2023-06-20 MED ORDER — GABAPENTIN 300 MG PO CAPS
300.0000 mg | ORAL_CAPSULE | Freq: Every day | ORAL | 3 refills | Status: AC
Start: 2023-06-20 — End: ?

## 2023-06-25 ENCOUNTER — Telehealth: Payer: Self-pay

## 2023-06-25 NOTE — Telephone Encounter (Signed)
 This patient is appearing on a report for being at risk of failing the adherence measure for diabetes medications this calendar year.   Medication: metformin  500 mg daily Last fill date: 03/31/23 for 30 day supply  Medication was removed from medication list by PCP at last visit in April. Last A1c in February was at goal (5.8%). No further action needed at this time.    Abelina Abide, PharmD PGY1 Pharmacy Resident 06/25/2023 11:28 AM

## 2023-06-30 ENCOUNTER — Ambulatory Visit (HOSPITAL_COMMUNITY): Attending: Cardiology

## 2023-06-30 DIAGNOSIS — I2781 Cor pulmonale (chronic): Secondary | ICD-10-CM | POA: Insufficient documentation

## 2023-06-30 DIAGNOSIS — I2510 Atherosclerotic heart disease of native coronary artery without angina pectoris: Secondary | ICD-10-CM | POA: Insufficient documentation

## 2023-06-30 DIAGNOSIS — E669 Obesity, unspecified: Secondary | ICD-10-CM | POA: Insufficient documentation

## 2023-06-30 DIAGNOSIS — R7303 Prediabetes: Secondary | ICD-10-CM | POA: Insufficient documentation

## 2023-06-30 DIAGNOSIS — I2699 Other pulmonary embolism without acute cor pulmonale: Secondary | ICD-10-CM | POA: Insufficient documentation

## 2023-06-30 DIAGNOSIS — I358 Other nonrheumatic aortic valve disorders: Secondary | ICD-10-CM | POA: Insufficient documentation

## 2023-06-30 DIAGNOSIS — E785 Hyperlipidemia, unspecified: Secondary | ICD-10-CM | POA: Diagnosis not present

## 2023-06-30 LAB — ECHOCARDIOGRAM COMPLETE
AR max vel: 1.39 cm2
AV Area VTI: 1.54 cm2
AV Area mean vel: 1.41 cm2
AV Mean grad: 12 mmHg
AV Peak grad: 23.2 mmHg
Ao pk vel: 2.41 m/s
Area-P 1/2: 5.66 cm2
S' Lateral: 2.87 cm

## 2023-07-02 ENCOUNTER — Ambulatory Visit: Payer: Self-pay

## 2023-07-02 ENCOUNTER — Encounter: Payer: Self-pay | Admitting: Pulmonary Disease

## 2023-07-02 ENCOUNTER — Ambulatory Visit: Admitting: Pulmonary Disease

## 2023-07-02 VITALS — BP 122/72 | HR 47 | Ht 60.0 in | Wt 165.4 lb

## 2023-07-02 DIAGNOSIS — Z7901 Long term (current) use of anticoagulants: Secondary | ICD-10-CM | POA: Diagnosis not present

## 2023-07-02 DIAGNOSIS — I2694 Multiple subsegmental pulmonary emboli without acute cor pulmonale: Secondary | ICD-10-CM

## 2023-07-02 MED ORDER — APIXABAN 5 MG PO TABS: ORAL_TABLET | ORAL | 2 refills | Status: DC

## 2023-07-02 NOTE — Progress Notes (Signed)
 Patient ID: Courtney [name], female    DOB: May 18, 1950, 73 y.o.   MRN: [id number]  Chief Complaint  Patient presents with   Consult    History of Covid and Rsv, pt states they found blood clot in her lungs (May 25 2022).    Referring provider: Elvira Hammersmith, *  HPI:   73 y.o. woman whom we are seeing for evaluation of pulmonary embolism, provoked in the setting of COVID-19 infection, low risk based on CT images and biomarkers.  ED note x 2 reviewed.  PCP note reviewed.  Patient contracted COVID 04/2023.  Was treated with usual therapies.  Had ongoing cough, with sound like prolonged bronchitis symptoms.  Then developed acutely worsening shortness of breath.  This presentation to the ED.  CTA PE protocol on my review interpretation shows small, tiny filling defect in the branch of the left lower lobe, report indicates left upper lobe filling defect but I cannot see this with my eyes, the right looks clear to me.  She was diagnosed with RSV, new viral infection.  She was placed on Eliquis .  She returned 3 days later with worsening cough.  Repeat CTA PE protocol reports no sign of PE.  It appears to my the left lower lobe filling defect does appear improved.  Her dyspnea is improving.  She bruises easily.  No issues with bleeding, otherwise.  Questionaires / Pulmonary Flowsheets:   ACT:      No data to display          MMRC:     No data to display          Epworth:      No data to display          Tests:   FENO:  No results found for: "NITRICOXIDE"  PFT:     No data to display          WALK:      No data to display          Imaging: Personally reviewed as per EMR and discussion in this note ECHOCARDIOGRAM COMPLETE Result Date: 06/30/2023    ECHOCARDIOGRAM REPORT   Patient Name:   SHALAE LAMKE Date of Exam: 06/30/2023 Medical Rec #:  [id number]     Height:       60.0 in Accession #:    [id number]    Weight:       164.0 lb Date of Birth:   08-02-1950     BSA:          1.716 m Patient Age:    73 years      BP:           110/58 mmHg Patient Gender: F             HR:           49 bpm. Exam Location:  [address] Procedure: 2D Echo, 3D Echo and Strain Analysis (Both Spectral and Color Flow            Doppler were utilized during procedure). Indications:     Enlarged right ventricle  History:         Patient has prior history of Echocardiogram examinations, most                  recent 10/03/2020. CAD; Risk Factors:Hypertension and                  Dyslipidemia. Pulmonary embolism. Cor pulmonale. Carotid  artery                  disease. Aortic valve sclerosis. Prediabetes. Obesity.  Sonographer:     Mylinda Asa RCS Referring Phys:  [phone] Hubert Madden [place] Diagnosing Phys: Carson Clara MD IMPRESSIONS  1. Left ventricular ejection fraction, by estimation, is 65 to 70%. Left ventricular ejection fraction by 3D volume is 69 %. The left ventricle has normal function. The left ventricle has no regional wall motion abnormalities. Left ventricular diastolic  parameters were normal. The average left ventricular global longitudinal strain is -19.3 %. The global longitudinal strain is normal.  2. Right ventricular systolic function is normal. The right ventricular size is normal. There is normal pulmonary artery systolic pressure. The estimated right ventricular systolic pressure is 30.0 mmHg.  3. The mitral valve is normal in structure. Trivial mitral valve regurgitation.  4. The aortic valve is tricuspid. Aortic valve regurgitation is trivial. Mild to moderate aortic valve stenosis. Mild AS by gradients (Vmax 2.4 m/s, MG ), moderate by AVA (1.5cm^2) and DI (0.48)  5. The inferior vena cava is normal in size with greater than 50% respiratory variability, suggesting right atrial pressure of 3 mmHg. FINDINGS  Left Ventricle: Left ventricular ejection fraction, by estimation, is 65 to 70%. Left ventricular ejection fraction by 3D volume is 69 %. The left  ventricle has normal function. The left ventricle has no regional wall motion abnormalities. The average left ventricular global longitudinal strain is -19.3 %. Strain was performed and the global longitudinal strain is normal. The left ventricular internal cavity size was normal in size. There is no left ventricular hypertrophy. Left ventricular diastolic parameters were normal. Right Ventricle: The right ventricular size is normal. No increase in right ventricular wall thickness. Right ventricular systolic function is normal. There is normal pulmonary artery systolic pressure. The tricuspid regurgitant velocity is 2.60 m/s, and  with an assumed right atrial pressure of 3 mmHg, the estimated right ventricular systolic pressure is 30.0 mmHg. Left Atrium: Left atrial size was normal in size. Right Atrium: Right atrial size was normal in size. Pericardium: There is no evidence of pericardial effusion. Mitral Valve: The mitral valve is normal in structure. Trivial mitral valve regurgitation. Tricuspid Valve: The tricuspid valve is normal in structure. Tricuspid valve regurgitation is trivial. Aortic Valve: The aortic valve is tricuspid. Aortic valve regurgitation is trivial. Mild to moderate aortic stenosis is present. Aortic valve mean gradient measures 12.0 mmHg. Aortic valve peak gradient measures 23.2 mmHg. Aortic valve area, by VTI measures 1.54 cm. Pulmonic Valve: The pulmonic valve was not well visualized. Pulmonic valve regurgitation is trivial. Aorta: The aortic root and ascending aorta are structurally normal, with no evidence of dilitation. Venous: The inferior vena cava is normal in size with greater than 50% respiratory variability, suggesting right atrial pressure of 3 mmHg. IAS/Shunts: The interatrial septum was not well visualized. Additional Comments: 3D was performed not requiring image post processing on an independent workstation and was normal.  LEFT VENTRICLE PLAX 2D LVIDd:         4.58 cm          Diastology LVIDs:         2.87 cm         LV e' medial:    13.40 cm/s LV PW:         1.09 cm         LV E/e' medial:  6.6 LV IVS:  0.95 cm         LV e' lateral:   16.10 cm/s LVOT diam:     2.00 cm         LV E/e' lateral: 5.5 LV SV:         98 LV SV Index:   57              2D Longitudinal LVOT Area:     3.14 cm        Strain                                2D Strain GLS   -20.5 %                                (A4C):                                2D Strain GLS   -18.3 %                                (A3C):                                2D Strain GLS   -19.1 %                                (A2C):                                2D Strain GLS   -19.3 %                                Avg:                                 3D Volume EF                                LV 3D EF:    Left                                             ventricul                                             ar                                             ejection  fraction                                             by 3D                                             volume is                                             69 %.                                 3D Volume EF:                                3D EF:        69 %                                LV EDV:       140 ml                                LV ESV:       44 ml                                LV SV:        96 ml RIGHT VENTRICLE RV Basal diam:  2.65 cm RV S prime:     10.30 cm/s TAPSE (M-mode): 2.1 cm LEFT ATRIUM             Index        RIGHT ATRIUM           Index LA diam:        3.60 cm 2.10 cm/m   RA Area:     13.10 cm LA Vol (A2C):   56.8 ml 33.11 ml/m  RA Volume:   27.10 ml  15.80 ml/m LA Vol (A4C):   59.3 ml 34.56 ml/m LA Biplane Vol: 58.7 ml 34.21 ml/m  AORTIC VALVE AV Area (Vmax):    1.39 cm AV Area (Vmean):   1.41 cm AV Area (VTI):     1.54 cm AV Vmax:           241.00 cm/s AV Vmean:          162.000 cm/s AV VTI:             0.635 m AV Peak Grad:      23.2 mmHg AV Mean Grad:      12.0 mmHg LVOT Vmax:         107.00 cm/s LVOT Vmean:        72.800 cm/s LVOT VTI:          0.312 m LVOT/AV VTI ratio: 0.49  AORTA Ao Root diam: 2.50 cm Ao Asc diam:  3.20 cm MITRAL VALVE  TRICUSPID VALVE MV Area (PHT): 5.66 cm    TR Peak grad:   27.0 mmHg MV Decel Time: 134 msec    TR Vmax:        260.00 cm/s MV E velocity: 88.60 cm/s MV A velocity: 77.60 cm/s  SHUNTS MV E/A ratio:  1.14        Systemic VTI:  0.31 m                            Systemic Diam: 2.00 cm Carson Clara MD Electronically signed by Carson Clara MD Signature Date/Time: 06/30/2023/2:00:29 PM    Final (Updated)     Lab Results: Personally reviewed CBC    Component Value Date/Time   WBC 8.4 05/27/2023 2310   RBC 3.93 05/27/2023 2310   HGB 12.4 05/27/2023 2310   HGB 12.4 03/25/2023 1100   HCT 37.0 05/27/2023 2310   HCT 36.6 03/25/2023 1100   PLT 182 05/27/2023 2310   PLT 227 03/25/2023 1100   MCV 94.1 05/27/2023 2310   MCV 93 03/25/2023 1100   MCH 31.6 05/27/2023 2310   MCHC 33.5 05/27/2023 2310   RDW 12.4 05/27/2023 2310   RDW 12.4 03/25/2023 1100   LYMPHSABS 2.4 03/25/2023 1100   MONOABS 0.5 07/25/2022 1442   EOSABS 0.2 03/25/2023 1100   BASOSABS 0.1 03/25/2023 1100    BMET    Component Value Date/Time   NA 136 05/27/2023 2310   NA 139 03/25/2023 1100   K 3.6 05/27/2023 2310   CL 105 05/27/2023 2310   CO2 21 (L) 05/27/2023 2310   GLUCOSE 244 (H) 05/27/2023 2310   BUN 23 05/27/2023 2310   BUN 24 03/25/2023 1100   CREATININE 0.93 05/27/2023 2310   CALCIUM  8.6 (L) 05/27/2023 2310   GFRNONAA >60 05/27/2023 2310   GFRAA 63 03/30/2020 1030    BNP No results found for: "BNP"  ProBNP No results found for: "PROBNP"  Specialty Problems       Pulmonary Problems   Acute bronchitis due to COVID-19 virus   Bronchospasm   RSV bronchitis    Allergies  Allergen Reactions   Erythromycin Hives   Penicillins Hives     Has patient had a PCN reaction causing immediate rash, facial/tongue/throat swelling, SOB or lightheadedness with hypotension: No Has patient had a PCN reaction causing severe rash involving mucus membranes or skin necrosis: No Has patient had a PCN reaction that required hospitalization: No Has patient had a PCN reaction occurring within the last 10 years: No If all of the above answers are "NO", then may proceed with Cephalosporin use.    Shellfish Allergy Hives   Doxycycline  Rash    Immunization History  Administered Date(s) Administered   Fluad Quad(high Dose 65+) 10/20/2018, 12/09/2019, 11/09/2021   Fluad Trivalent(High Dose 65+) 12/04/2022   Influenza, High Dose Seasonal PF 12/03/2016, 11/17/2017   Influenza-Unspecified 10/20/2014, 11/30/2020   PFIZER(Purple Top)SARS-COV-2 Vaccination 02/26/2019, 03/17/2019, 12/09/2019   Pneumococcal Conjugate-13 02/18/2014, 01/06/2015   Pneumococcal Polysaccharide-23 11/29/2015   Pneumococcal-Unspecified 02/18/2014   Tdap 11/29/2015   Zoster Recombinant(Shingrix) 12/26/2016, 02/25/2017    Past Medical History:  Diagnosis Date   Aortic valve sclerosis    echo 12/2008, soft systolic murmur   Arthritis    in right hip   Back pain    Basal cell carcinoma 05/31/2016   bcc left forehead  tx exc   Carotid artery disease (HCC)    40-59% bilateral, doppler December 2010  Cataract    Constipation    Coronary artery disease    a. BMS-mid RCA 11/2007 b. stress echo 02/2009 subtle inferior HK, lateral ST depressions with stress c. follow-up cath 02/2009: RCA stent patent, severe but stable stenosis of distal posterior lateral branch RCA. LV normal, med tx unless more sx c. ETT Myoview  08/27/12: negative for ischemia; EF 64%   Decreased hearing    Depression    Mild, August, 2012   Dyslipidemia    Easy bruising    Ejection fraction    EF normal, catheterization, 2011 /  EF 55%, echo, 2010   Emphysema of lung (HCC)    Fluid overload    Mild,  stable since hospitalization in the past   GERD (gastroesophageal reflux disease)    occarionally, takes Pepcid  over the counter   Heart murmur    has, occasional PVC's   History of right hip replacement    Hyperlipidemia    Hypertension    Joint pain    Myocardial infarction (HCC)    2009   Palpitations    PVC (premature ventricular contraction)    per prior Holter monitor   Right hip pain 12/2009   Shellfish allergy    Stress fracture    Left foot   Trouble in sleeping    Vitamin B12 deficiency    Vitamin D  deficiency     Tobacco History: Social History   Tobacco Use  Smoking Status Never  Smokeless Tobacco Never   Counseling given: Not Answered   Continue to not smoke  Outpatient Encounter Medications as of 07/02/2023  Medication Sig   ALPRAZolam  (XANAX ) 0.25 MG tablet Take 1 tablet (0.25 mg total) by mouth 2 (two) times daily as needed for anxiety.   amLODipine  (NORVASC ) 5 MG tablet Take 1 tablet (5 mg total) by mouth daily.   aspirin  81 MG tablet Take 81 mg by mouth daily.   benzonatate  (TESSALON ) 100 MG capsule Take 1 capsule (100 mg total) by mouth 2 (two) times daily as needed for cough.   clobetasol  cream (TEMOVATE ) 0.05 % Apply 1-2 times a day to poison [place] for 2 weeks   Evolocumab  (REPATHA  SURECLICK) 140 MG/ML SOAJ Inject 140 mg into the skin every 14 (fourteen) days.   ezetimibe  (ZETIA ) 10 MG tablet TAKE 1 TABLET BY MOUTH EVERY DAY   gabapentin  (NEURONTIN ) 300 MG capsule Take 1 capsule (300 mg total) by mouth at bedtime.   ipratropium-albuterol  (DUONEB) 0.5-2.5 (3) MG/3ML SOLN Take 3 mLs by nebulization every 6 (six) hours as needed.   metoprolol  tartrate (LOPRESSOR ) 25 MG tablet Take 1 tablet (25 mg total) by mouth 2 (two) times daily.   mupirocin  ointment (BACTROBAN ) 2 % Apply 1 Application topically 2 (two) times daily.   nitroGLYCERIN  (NITROSTAT ) 0.4 MG SL tablet Place 1 tablet (0.4 mg total) under the tongue every 5 (five) minutes as needed for chest  pain.   pantoprazole  (PROTONIX ) 40 MG tablet TAKE 1 TABLET BY MOUTH EVERY DAY   ramipril  (ALTACE ) 10 MG capsule TAKE 1 CAPSULE BY MOUTH EVERY DAY   rosuvastatin  (CRESTOR ) 40 MG tablet TAKE 1 TABLET BY MOUTH EVERY DAY   [DISCONTINUED] apixaban  (ELIQUIS ) 5 MG TABS tablet Take 2 tablets (10mg ) twice daily for 7 days, then 1 tablet (5mg ) twice daily   apixaban  (ELIQUIS ) 5 MG TABS tablet Take 2 tablets (10mg ) twice daily for 7 days, then 1 tablet (5mg ) twice daily   [DISCONTINUED] methocarbamol  (ROBAXIN ) 500 MG tablet Take 1 tablet (500 mg  total) by mouth every 8 (eight) hours as needed for muscle spasms.   [DISCONTINUED] oxyCODONE  (ROXICODONE ) 5 MG immediate release tablet Take 1 tablet (5 mg total) by mouth every 4 (four) hours as needed for severe pain (pain score 7-10).   [DISCONTINUED] predniSONE  (DELTASONE ) 50 MG tablet Take 1 tablet (50 mg total) by mouth daily with breakfast.   [DISCONTINUED] promethazine -dextromethorphan (PROMETHAZINE -DM) 6.25-15 MG/5ML syrup Take 5 mLs by mouth 4 (four) times daily as needed for cough.   No facility-administered encounter medications on file as of 07/02/2023.     Review of Systems  Review of Systems  No chest pain with exertion.  No orthopnea or PND.  Comprehensive review of systems otherwise negative Physical Exam  BP 122/72 (BP Location: Left Arm, Patient Position: Sitting, Cuff Size: Normal)   Pulse (!) 47   Ht 5' (1.524 m)   Wt 165 lb 6.4 oz (75 kg)   SpO2 99%   BMI 32.30 kg/m   Wt Readings from Last 5 Encounters:  07/02/23 165 lb 6.4 oz (75 kg)  05/27/23 164 lb (74.4 kg)  05/12/23 160 lb (72.6 kg)  04/22/23 165 lb (74.8 kg)  03/26/23 161 lb (73 kg)    BMI Readings from Last 5 Encounters:  07/02/23 32.30 kg/m  05/27/23 32.03 kg/m  05/12/23 31.25 kg/m  04/22/23 32.22 kg/m  03/26/23 31.44 kg/m     Physical Exam General: Sitting in chair, no acute distress Eyes: EOMI, no icterus Neck: Supple, no JVP Abdomen:  Nondistended Pulmonary: Clear, no work of breathing Cardiovascular: Regular rate rhythm, 2/6 systolic murmur heard best left upper sternal border MSK: No synovitis, no joint effusion Neuro: Normal gait, no weakness Psych: Normal mood, full affect  Assessment & Plan:   Low risk pulmonary embolism: No enlargement of RV on CT scan, initial biomarkers not checked but troponin negative on the repeat labs 2 days later after diagnosis.  Provoked in the setting of preceding COVID infection.  Emboli burden is very small on my personal review of scan.  Emboli are not seen per radiology report 3 days later, 05/28/2023.  After shared decision making, we agreed with 3 months of anticoagulation.  Eliquis  refilled today.  Encouraging signs that the blood clot has been adequately treated including improvement in symptoms, lack of submassive signs on imaging and lab work, and recent echocardiogram that shows no concerning findings for stress on the heart or pulmonary hypertension.  Dyspnea on exertion: With viral symptoms and signs of bronchitis.  Cough etc. getting better.  Dyspnea is slowly improving.  All encouraging signs.  Likely initially triggered by viral symptoms COVID and RSV but acutely worsened with development of PE prompting ED presentation.  Encouraged to slowly increase exercise, suspect this will help with dyspnea.   Return if symptoms worsen or fail to improve.   Guerry Leek, MD 07/02/2023

## 2023-07-02 NOTE — Patient Instructions (Signed)
 Will plan 3 months of Eliquis  to treat blood clot, you can stop sometime, it is easier to stop July 31 that is fine.  I refilled the Eliquis  today, there should be plenty to get as to that date.  Please contact me if your breathing worsens or is not improving, happy to help  Return to clinic as needed

## 2023-07-08 ENCOUNTER — Ambulatory Visit
Admission: RE | Admit: 2023-07-08 | Discharge: 2023-07-08 | Disposition: A | Discharge status: Home / Self Care | Source: Ambulatory Visit | Attending: Obstetrics and Gynecology | Admitting: Obstetrics and Gynecology

## 2023-07-08 DIAGNOSIS — Z1231 Encounter for screening mammogram for malignant neoplasm of breast: Secondary | ICD-10-CM | POA: Diagnosis not present

## 2023-07-17 DIAGNOSIS — Z1239 Encounter for other screening for malignant neoplasm of breast: Secondary | ICD-10-CM | POA: Diagnosis not present

## 2023-07-17 DIAGNOSIS — Z6830 Body mass index (BMI) 30.0-30.9, adult: Secondary | ICD-10-CM | POA: Diagnosis not present

## 2023-07-17 DIAGNOSIS — Z1211 Encounter for screening for malignant neoplasm of colon: Secondary | ICD-10-CM | POA: Diagnosis not present

## 2023-07-17 DIAGNOSIS — Z01419 Encounter for gynecological examination (general) (routine) without abnormal findings: Secondary | ICD-10-CM | POA: Diagnosis not present

## 2023-07-17 DIAGNOSIS — M8580 Other specified disorders of bone density and structure, unspecified site: Secondary | ICD-10-CM | POA: Diagnosis not present

## 2023-07-30 ENCOUNTER — Ambulatory Visit (INDEPENDENT_AMBULATORY_CARE_PROVIDER_SITE_OTHER): Payer: Medicare HMO | Admitting: Otolaryngology

## 2023-07-30 ENCOUNTER — Encounter (INDEPENDENT_AMBULATORY_CARE_PROVIDER_SITE_OTHER): Payer: Self-pay | Admitting: Otolaryngology

## 2023-07-30 VITALS — BP 122/71 | HR 52 | Ht 60.0 in | Wt 163.0 lb

## 2023-07-30 DIAGNOSIS — H903 Sensorineural hearing loss, bilateral: Secondary | ICD-10-CM | POA: Diagnosis not present

## 2023-08-01 DIAGNOSIS — H903 Sensorineural hearing loss, bilateral: Secondary | ICD-10-CM | POA: Insufficient documentation

## 2023-08-01 NOTE — Progress Notes (Signed)
 Patient ID: Courtney [name], female   DOB: 1950-11-18, 73 y.o.   MRN: [id number]  Follow-up: Hearing loss  HPI: The patient is a 73 year old female who returns today for her follow-up evaluation.  She was previously seen for bilateral progressive hearing loss, worse on the right side.  Her MRI scan was negative for retrocochlear lesion.  The patient returns today complaining of hearing difficulty in noisy environments.  She denies any recent change in her hearing.  She has never worn hearing aids.  She denies any otalgia or otorrhea.  Exam: General: Communicates without difficulty, well nourished, no acute distress. Head: Normocephalic, no evidence injury, no tenderness, facial buttresses intact without stepoff. Face/sinus: No tenderness to palpation and percussion. Facial movement is normal and symmetric. Eyes: PERRL, EOMI. No scleral icterus, conjunctivae clear. Neuro: CN II exam reveals vision grossly intact.  No nystagmus at any point of gaze. Ears: Auricles well formed without lesions.  Ear canals are intact without mass or lesion.  No erythema or edema is appreciated.  The TMs are intact without fluid. Nose: External evaluation reveals normal support and skin without lesions.  Dorsum is intact.  Anterior rhinoscopy reveals pink mucosa over anterior aspect of inferior turbinates and intact septum.  No purulence noted. Oral:  Oral cavity and oropharynx are intact, symmetric, without erythema or edema.  Mucosa is moist without lesions. Neck: Full range of motion without pain.  There is no significant lymphadenopathy.  No masses palpable.  Thyroid  bed within normal limits to palpation.  Parotid glands and submandibular glands equal bilaterally without mass.  Trachea is midline. Neuro:  CN 2-12 grossly intact. Gait normal.   Assessment: 1.  Subjectively stable bilateral high-frequency sensorineural hearing loss, slightly worse on the right side. 2.  Her MRI scan was negative for retrocochlear lesion. 3.   Her ear canals, tympanic membranes, and middle ear spaces are normal.  No middle ear effusion or infection is noted.  Plan: 1.  The physical exam findings are reviewed with the patient.  2.  The patient is a candidate for hearing amplification.  She would like to obtain her hearing aids from [facility]. 3.  The patient will return for reevaluation in 1 year.

## 2023-08-13 ENCOUNTER — Ambulatory Visit: Admitting: Dermatology

## 2023-08-13 ENCOUNTER — Encounter: Payer: Self-pay | Admitting: Dermatology

## 2023-08-13 VITALS — BP 118/62

## 2023-08-13 DIAGNOSIS — D1801 Hemangioma of skin and subcutaneous tissue: Secondary | ICD-10-CM | POA: Diagnosis not present

## 2023-08-13 DIAGNOSIS — Z1283 Encounter for screening for malignant neoplasm of skin: Secondary | ICD-10-CM | POA: Diagnosis not present

## 2023-08-13 DIAGNOSIS — D492 Neoplasm of unspecified behavior of bone, soft tissue, and skin: Secondary | ICD-10-CM | POA: Diagnosis not present

## 2023-08-13 DIAGNOSIS — L918 Other hypertrophic disorders of the skin: Secondary | ICD-10-CM

## 2023-08-13 DIAGNOSIS — L821 Other seborrheic keratosis: Secondary | ICD-10-CM

## 2023-08-13 DIAGNOSIS — L814 Other melanin hyperpigmentation: Secondary | ICD-10-CM | POA: Diagnosis not present

## 2023-08-13 DIAGNOSIS — W908XXA Exposure to other nonionizing radiation, initial encounter: Secondary | ICD-10-CM | POA: Diagnosis not present

## 2023-08-13 DIAGNOSIS — D485 Neoplasm of uncertain behavior of skin: Secondary | ICD-10-CM

## 2023-08-13 DIAGNOSIS — D229 Melanocytic nevi, unspecified: Secondary | ICD-10-CM

## 2023-08-13 DIAGNOSIS — L578 Other skin changes due to chronic exposure to nonionizing radiation: Secondary | ICD-10-CM | POA: Diagnosis not present

## 2023-08-13 DIAGNOSIS — C44519 Basal cell carcinoma of skin of other part of trunk: Secondary | ICD-10-CM | POA: Diagnosis not present

## 2023-08-13 NOTE — Patient Instructions (Addendum)

## 2023-08-13 NOTE — Progress Notes (Signed)
   New Patient Visit   Subjective  Courtney [name] is a 73 y.o. female who presents for the following: Total Body Skin Exam (TBSE)  Patient present today for new patient visit for TBSE.The patient reports she has spots, moles and lesions to be evaluated, some may be new or changing and the patient may have concern these could be cancer. Patient has previously been treated by a dermatologist. Reports hx of bx (benign). Patient denies family history of skin cancers. Patient reports throughout her lifetime has had moderate sun exposure. Currently, patient reports if she has excessive sun exposure, she does apply sunscreen and/or wears protective coverings.  The following portions of the chart were reviewed this encounter and updated as appropriate: medications, allergies, medical history  Review of Systems:  No other skin or systemic complaints except as noted in HPI or Assessment and Plan.  Objective  Well appearing patient in no apparent distress; mood and affect are within normal limits.  A full examination was performed including scalp, head, eyes, ears, nose, lips, neck, chest, axillae, abdomen, back, buttocks, bilateral upper extremities, bilateral lower extremities, hands, feet, fingers, toes, fingernails, and toenails. All findings within normal limits unless otherwise noted below.     Relevant exam findings are noted in the Assessment and Plan.        Assessment & Plan   LENTIGINES, SEBORRHEIC KERATOSES, HEMANGIOMAS - Benign normal skin lesions - Benign-appearing - Call for any changes  BENIGN MELANOCYTIC NEVI - Tan-brown and/or pink-flesh-colored symmetric macules and papules - Benign appearing on exam today - Observation - Call clinic for new or changing moles - Recommend daily use of broad spectrum spf 30+ sunscreen to sun-exposed areas.   MILD ACTINIC DAMAGE - Chronic condition, secondary to cumulative UV/sun exposure - diffuse scaly erythematous macules with underlying  dyspigmentation - Recommend daily broad spectrum sunscreen SPF 30+ to sun-exposed areas, reapply every 2 hours as needed.  - Staying in the shade or wearing long sleeves, sun glasses (UVA+UVB protection) and wide brim hats (4-inch brim around the entire circumference of the hat) are also recommended for sun protection.  - Call for new or changing lesions.   SKIN CANCER SCREENING PERFORMED TODAY   No follow-ups on file.   Documentation: I have reviewed the above documentation for accuracy and completeness, and I agree with the above.  I, Shirron Maranda, CMA, am acting as scribe for [employer], DO.   Delon Lenis, DO

## 2023-08-14 LAB — SURGICAL PATHOLOGY

## 2023-08-15 ENCOUNTER — Ambulatory Visit: Payer: Self-pay | Admitting: Dermatology

## 2023-08-15 DIAGNOSIS — C4491 Basal cell carcinoma of skin, unspecified: Secondary | ICD-10-CM

## 2023-08-15 NOTE — Progress Notes (Signed)
 HI Shirron,  Please call pt and notify their bx results were positive for a skin CA that will be excised by Dr. Corey  Diagnosis Skin , chest - medial (center) BASAL CELL CARCINOMA, SUPERFICIAL AND NODULAR PATTERNS, PERIPHERAL MARGIN INVOLVED

## 2023-08-18 ENCOUNTER — Encounter: Payer: Self-pay | Admitting: Dermatology

## 2023-08-18 DIAGNOSIS — C4491 Basal cell carcinoma of skin, unspecified: Secondary | ICD-10-CM | POA: Insufficient documentation

## 2023-08-19 ENCOUNTER — Ambulatory Visit: Payer: Medicare HMO | Admitting: Dermatology

## 2023-08-26 ENCOUNTER — Encounter (INDEPENDENT_AMBULATORY_CARE_PROVIDER_SITE_OTHER): Payer: Self-pay | Admitting: Family Medicine

## 2023-08-26 ENCOUNTER — Ambulatory Visit (INDEPENDENT_AMBULATORY_CARE_PROVIDER_SITE_OTHER): Admitting: Family Medicine

## 2023-08-26 VITALS — BP 99/61 | HR 46 | Temp 97.6°F | Ht 60.0 in | Wt 162.0 lb

## 2023-08-26 DIAGNOSIS — E559 Vitamin D deficiency, unspecified: Secondary | ICD-10-CM | POA: Diagnosis not present

## 2023-08-26 DIAGNOSIS — E669 Obesity, unspecified: Secondary | ICD-10-CM

## 2023-08-26 DIAGNOSIS — R7303 Prediabetes: Secondary | ICD-10-CM

## 2023-08-26 DIAGNOSIS — I10 Essential (primary) hypertension: Secondary | ICD-10-CM

## 2023-08-26 DIAGNOSIS — I2694 Multiple subsegmental pulmonary emboli without acute cor pulmonale: Secondary | ICD-10-CM

## 2023-08-26 DIAGNOSIS — Z6831 Body mass index (BMI) 31.0-31.9, adult: Secondary | ICD-10-CM

## 2023-08-26 DIAGNOSIS — E7849 Other hyperlipidemia: Secondary | ICD-10-CM

## 2023-08-26 DIAGNOSIS — I2699 Other pulmonary embolism without acute cor pulmonale: Secondary | ICD-10-CM

## 2023-08-26 NOTE — Progress Notes (Signed)
 Office: [phone]  /  Fax: [phone]  WEIGHT SUMMARY AND BIOMETRICS  Anthropometric Measurements Height: 5' (1.524 m) Weight: 162 lb (73.5 kg) BMI (Calculated): 31.64 Weight at Last Visit: 161 lb Weight Lost Since Last Visit: 0 Weight Gained Since Last Visit: 1 lb Starting Weight: 157 lb Total Weight Loss (lbs): 0 lb (0 kg) Peak Weight: 162 lb   Body Composition  Body Fat %: 43.8 % Fat Mass (lbs): 71.2 lbs Muscle Mass (lbs): 86.6 lbs Total Body Water (lbs): 61.8 lbs Visceral Fat Rating : 13   Other Clinical Data Fasting: no Labs: no Today's Visit #: 72 Starting Date: 06/16/17    Chief Complaint: OBESITY   History of Present Illness Courtney [name] is a 73 year old female who presents for a follow-up on her obesity treatment plan and progress assessment.  She has been adhering to the prescribed category one eating plan about fifty percent of the time and is not currently engaging in exercise. Over the past five months, she has gained one pound. Her diet during her illness primarily consisted of mashed potatoes, and she is now exploring healthier frozen meal options that align with her nutritional goals. She is mindful of her fiber intake and portion sizes but has not been hydrating adequately, although she ensures water intake when outdoors. Due to the heat, she is not participating in much outdoor activity but has a pending paint project.  She experienced a challenging winter and spring, beginning with a cold or upper respiratory infection and a cough in March. She tested positive for COVID-19, which progressed to pneumonia, resulting in significant shortness of breath. She was diagnosed with pneumonia at urgent care and later with COVID-19, RSV, and two pulmonary emboli at the emergency room in early April. She was started on Eliquis  and given an inhaler. In April, she experienced severe pain and difficulty breathing, leading to another emergency room visit where she  was checked for cracked ribs and underwent a chest x-ray and CT scan, which showed no changes. Pain management was initiated with Dilaudid , which allowed her to breathe more comfortably. She was also given hydrocodone  and found relief with a muscle relaxant and Phenergan  cough medicine. She had a pulmonary consult in March, where she recalls being told that her MRI showed very small clots. She has been regaining her strength since then.  She canceled her colonoscopy scheduled for April due to her illness and is considering alternative screening options. She has had three normal colonoscopies since a finding in 2002 and is contemplating the Cologuard test.  She had a dermatology appointment where basal cell carcinoma was found and removed.  No lightheadedness or dizziness despite low blood pressure readings.      PHYSICAL EXAM:  Blood pressure 99/61, pulse (!) 46, temperature 97.6 F (36.4 C), height 5' (1.524 m), weight 162 lb (73.5 kg), SpO2 96%. Body mass index is 31.64 kg/m.  DIAGNOSTIC DATA REVIEWED:  BMET    Component Value Date/Time   NA 136 05/27/2023 2310   NA 139 03/25/2023 1100   K 3.6 05/27/2023 2310   CL 105 05/27/2023 2310   CO2 21 (L) 05/27/2023 2310   GLUCOSE 244 (H) 05/27/2023 2310   BUN 23 05/27/2023 2310   BUN 24 03/25/2023 1100   CREATININE 0.93 05/27/2023 2310   CALCIUM  8.6 (L) 05/27/2023 2310   GFRNONAA >60 05/27/2023 2310   GFRAA 63 03/30/2020 1030   Lab Results  Component Value Date   HGBA1C 5.8 (H)  03/26/2023   HGBA1C 5.6 06/16/2017   Lab Results  Component Value Date   INSULIN  10.2 02/26/2022   INSULIN  6.4 06/16/2017   Lab Results  Component Value Date   TSH 1.020 02/26/2022   CBC    Component Value Date/Time   WBC 8.4 05/27/2023 2310   RBC 3.93 05/27/2023 2310   HGB 12.4 05/27/2023 2310   HGB 12.4 03/25/2023 1100   HCT 37.0 05/27/2023 2310   HCT 36.6 03/25/2023 1100   PLT 182 05/27/2023 2310   PLT 227 03/25/2023 1100   MCV 94.1  05/27/2023 2310   MCV 93 03/25/2023 1100   MCH 31.6 05/27/2023 2310   MCHC 33.5 05/27/2023 2310   RDW 12.4 05/27/2023 2310   RDW 12.4 03/25/2023 1100   Iron Studies    Component Value Date/Time   IRON 117 10/17/2020 1013   IRON 100 01/25/2019 0853   TIBC 431.2 10/17/2020 1013   TIBC 328 01/25/2019 0853   FERRITIN 133.2 10/17/2020 1013   FERRITIN 54 01/25/2019 0853   IRONPCTSAT 27.1 10/17/2020 1013   IRONPCTSAT 30 01/25/2019 0853   Lipid Panel     Component Value Date/Time   CHOL 133 03/25/2023 1100   TRIG 77 03/25/2023 1100   HDL 77 03/25/2023 1100   CHOLHDL 1.7 03/25/2023 1100   CHOLHDL 3 11/30/2015 1028   VLDL 25.4 11/30/2015 1028   LDLCALC 41 03/25/2023 1100   Hepatic Function Panel     Component Value Date/Time   PROT 6.7 05/25/2023 1130   PROT 6.3 03/25/2023 1100   ALBUMIN  4.1 05/25/2023 1130   ALBUMIN  4.2 03/25/2023 1100   AST 22 05/25/2023 1130   ALT 16 05/25/2023 1130   ALKPHOS 57 05/25/2023 1130   BILITOT 0.3 05/25/2023 1130   BILITOT 0.3 03/25/2023 1100   BILIDIR 0.13 05/15/2021 1000      Component Value Date/Time   TSH 1.020 02/26/2022 1046   Nutritional Lab Results  Component Value Date   VD25OH 90.1 10/15/2022   VD25OH 83.1 02/26/2022   VD25OH 63.8 10/18/2021     Assessment and Plan Assessment & Plan Pulmonary Embolism (PE) Diagnosed with PE in April post-COVID-19 infection. Initiated on Eliquis  in the ER; advised continuation for three months. Pulmonary consult indicated emboli were minimal and barely visible on MRI. Discussed typical PE presentation post-COVID and importance of anticoagulation therapy. - Continue Eliquis  for three months - Will monitor for signs of recurrence and continue to work on weight loss and exercise to help reduce risk of recurrence.   Obesity Adheres to category one eating plan 50% of the time; not exercising. Gained one pound in five months. Explored healthier meal options, including low-sodium frozen meals  and protein supplements. Discussed modified wheat starch as a lower-calorie alternative for baking, highlighting its prebiotic and fiber benefits. - Continue category one eating plan - Consider low-sodium frozen meals and protein supplements - Explore modified wheat starch for baking  Hypertension Blood pressure 99/61 mmHg. Regular medication use. Low blood pressure possibly due to low volume, especially in heat. Advised hydration. - Encourage adequate hydration, especially in heat  Vitamin D  Deficiency Vitamin D  deficiency noted. Plans to check vitamin D  levels in upcoming lab work. - Order vitamin D  level  General Health Maintenance Considering Cologuard test for colorectal cancer screening as an alternative to colonoscopy. Explained differences, including Cologuard's cancer DNA detection and recommended use every three years for low-risk individuals. Advised discussion with primary care physician. - Discuss Cologuard test with primary care physician  Follow-up Scheduled return in four weeks for follow-up. Lab work to be completed prior to next visit. - Follow up in four weeks - Order lab work including kidney, liver, blood sugar, cholesterol, and TSH - Complete lab work without appointment    She was informed of the importance of frequent follow up visits to maximize her success with intensive lifestyle modifications for her multiple health conditions.    Louann Penton, MD

## 2023-09-08 ENCOUNTER — Encounter (INDEPENDENT_AMBULATORY_CARE_PROVIDER_SITE_OTHER): Payer: Self-pay | Admitting: Family Medicine

## 2023-09-09 ENCOUNTER — Other Ambulatory Visit (INDEPENDENT_AMBULATORY_CARE_PROVIDER_SITE_OTHER): Payer: Self-pay | Admitting: Family Medicine

## 2023-09-09 DIAGNOSIS — R5383 Other fatigue: Secondary | ICD-10-CM

## 2023-09-11 DIAGNOSIS — R5383 Other fatigue: Secondary | ICD-10-CM | POA: Diagnosis not present

## 2023-09-11 DIAGNOSIS — H903 Sensorineural hearing loss, bilateral: Secondary | ICD-10-CM | POA: Diagnosis not present

## 2023-09-12 LAB — LIPID PANEL WITH LDL/HDL RATIO
Cholesterol, Total: 128 mg/dL (ref 100–199)
HDL: 70 mg/dL (ref >39)
LDL Chol Calc (NIH): 43 mg/dL (ref 0–99)
LDL/HDL Ratio: 0.6 ratio (ref 0.0–3.2)
Triglycerides: 79 mg/dL (ref 0–149)
VLDL Cholesterol Cal: 15 mg/dL (ref 5–40)

## 2023-09-12 LAB — CMP14+EGFR
ALT: 14 IU/L (ref 0–32)
AST: 18 IU/L (ref 0–40)
Albumin: 4.2 g/dL (ref 3.8–4.8)
Alkaline Phosphatase: 78 IU/L (ref 44–121)
BUN/Creatinine Ratio: 26 (ref 12–28)
BUN: 22 mg/dL (ref 8–27)
Bilirubin Total: 0.2 mg/dL (ref 0.0–1.2)
CO2: 19 mmol/L — ABNORMAL LOW (ref 20–29)
Calcium: 9.7 mg/dL (ref 8.7–10.3)
Chloride: 106 mmol/L (ref 96–106)
Creatinine, Ser: 0.86 mg/dL (ref 0.57–1.00)
Globulin, Total: 2.2 g/dL (ref 1.5–4.5)
Glucose: 101 mg/dL — ABNORMAL HIGH (ref 70–99)
Potassium: 4.8 mmol/L (ref 3.5–5.2)
Sodium: 141 mmol/L (ref 134–144)
Total Protein: 6.4 g/dL (ref 6.0–8.5)
eGFR: 71 mL/min/1.73 (ref >59)

## 2023-09-12 LAB — TSH: TSH: 1.03 u[IU]/mL (ref 0.450–4.500)

## 2023-09-12 LAB — HEMOGLOBIN A1C
Est. average glucose Bld gHb Est-mCnc: 123 mg/dL
Hgb A1c MFr Bld: 5.9 % — ABNORMAL HIGH (ref 4.8–5.6)

## 2023-09-12 LAB — VITAMIN D 25 HYDROXY (VIT D DEFICIENCY, FRACTURES): Vit D, 25-Hydroxy: 42.4 ng/mL (ref 30.0–100.0)

## 2023-09-12 LAB — CBC WITH DIFFERENTIAL/PLATELET
Basophils Absolute: 0.1 x10E3/uL (ref 0.0–0.2)
Basos: 1 %
EOS (ABSOLUTE): 0.3 x10E3/uL (ref 0.0–0.4)
Eos: 4 %
Hematocrit: 37.7 % (ref 34.0–46.6)
Hemoglobin: 12.4 g/dL (ref 11.1–15.9)
Immature Grans (Abs): 0 x10E3/uL (ref 0.0–0.1)
Immature Granulocytes: 0 %
Lymphocytes Absolute: 3.2 x10E3/uL — ABNORMAL HIGH (ref 0.7–3.1)
Lymphs: 48 %
MCH: 31.2 pg (ref 26.6–33.0)
MCHC: 32.9 g/dL (ref 31.5–35.7)
MCV: 95 fL (ref 79–97)
Monocytes Absolute: 0.4 x10E3/uL (ref 0.1–0.9)
Monocytes: 6 %
Neutrophils Absolute: 2.8 x10E3/uL (ref 1.4–7.0)
Neutrophils: 41 %
Platelets: 233 x10E3/uL (ref 150–450)
RBC: 3.97 x10E6/uL (ref 3.77–5.28)
RDW: 12 % (ref 11.7–15.4)
WBC: 6.7 x10E3/uL (ref 3.4–10.8)

## 2023-09-12 LAB — INSULIN, RANDOM: INSULIN: 10.5 u[IU]/mL (ref 2.6–24.9)

## 2023-09-12 LAB — VITAMIN B12: Vitamin B-12: 295 pg/mL (ref 232–1245)

## 2023-09-19 ENCOUNTER — Encounter: Payer: Self-pay | Admitting: Emergency Medicine

## 2023-09-19 NOTE — Telephone Encounter (Signed)
 Recommend topical antibiotic at this point.

## 2023-09-24 ENCOUNTER — Encounter (INDEPENDENT_AMBULATORY_CARE_PROVIDER_SITE_OTHER): Payer: Self-pay | Admitting: Family Medicine

## 2023-09-24 ENCOUNTER — Ambulatory Visit (INDEPENDENT_AMBULATORY_CARE_PROVIDER_SITE_OTHER): Admitting: Family Medicine

## 2023-09-24 VITALS — BP 124/78 | HR 55 | Temp 97.9°F | Ht 60.0 in | Wt 163.0 lb

## 2023-09-24 DIAGNOSIS — R7303 Prediabetes: Secondary | ICD-10-CM

## 2023-09-24 DIAGNOSIS — E669 Obesity, unspecified: Secondary | ICD-10-CM

## 2023-09-24 DIAGNOSIS — Z6831 Body mass index (BMI) 31.0-31.9, adult: Secondary | ICD-10-CM | POA: Diagnosis not present

## 2023-09-24 DIAGNOSIS — E538 Deficiency of other specified B group vitamins: Secondary | ICD-10-CM

## 2023-09-24 DIAGNOSIS — E559 Vitamin D deficiency, unspecified: Secondary | ICD-10-CM

## 2023-09-24 MED ORDER — VITAMIN B-12 1000 MCG PO TABS: 1000.0000 ug | ORAL_TABLET | Freq: Every day | ORAL | Status: AC

## 2023-09-24 MED ORDER — METFORMIN HCL ER 500 MG PO TB24: 500.0000 mg | ORAL_TABLET | Freq: Every day | ORAL | 0 refills | Status: DC

## 2023-09-24 MED ORDER — VITAMIN D3 25 MCG (1000 UT) PO CAPS: 1000.0000 [IU] | ORAL_CAPSULE | Freq: Every day | ORAL | Status: AC

## 2023-09-24 NOTE — Progress Notes (Signed)
 Office: [phone]  /  Fax: [phone]  WEIGHT SUMMARY AND BIOMETRICS  Anthropometric Measurements Height: 5' (1.524 m) Weight: 163 lb (73.9 kg) BMI (Calculated): 31.83 Weight at Last Visit: 162lb Weight Lost Since Last Visit: 0lb Weight Gained Since Last Visit: 1lb Starting Weight: 157lb Total Weight Loss (lbs): 0 lb (0 kg) Peak Weight: 162lb   Body Composition  Body Fat %: 44.7 % Fat Mass (lbs): 73.2 lbs Muscle Mass (lbs): 86 lbs Total Body Water (lbs): 62.4 lbs Visceral Fat Rating : 13   Other Clinical Data Fasting: No Labs: no Today's Visit #: 63 Starting Date: 06/16/17    Chief Complaint: OBESITY    History of Present Illness Courtney [name] is a 73 year old female who presents for a follow-up on her obesity treatment plan.  She is adhering to the category one eating plan approximately 75% of the time and is attempting to incorporate more whole foods, such as fruits and vegetables, into her diet. However, she is not meeting her protein goals and has not been exercising recently. She has gained one pound in the last month since her last visit.  She is not achieving the recommended seven to nine hours of sleep per night and is struggling with adequate hydration. She feels lethargic after consuming sweets, particularly after an ice cream event, which she associates with her sleep issues.  She has completed her course of Eliquis  and has a rescheduled colonoscopy due to her medical history. She experiences occasional wheezing but does not find it significant enough to use her inhaler.  Her recent lab results show a glucose level of 101. Her kidney and liver function tests were reported to her as not concerning. Her LDL cholesterol was 43. She is not currently taking vitamin D  supplements, and her vitamin D  levels are slightly below the target. Her B12 levels are low, and she acknowledges not consuming enough protein.  She has a history of prediabetes with an A1c  of 5.9, which has been stable over time. She has not been on metformin  recently. She has not been on steroids since April.  She is a member of a fitness center but has not been attending due to difficulty motivating herself. She enjoys swimming but finds the pool schedule limiting due to increased class times. She has a recumbent bike at home and acknowledges that walking improves her condition, although she has not been consistent with it.      PHYSICAL EXAM:  Blood pressure 124/78, pulse (!) 55, temperature 97.9 F (36.6 C), height 5' (1.524 m), weight 163 lb (73.9 kg), SpO2 99%. Body mass index is 31.83 kg/m.  DIAGNOSTIC DATA REVIEWED:  BMET    Component Value Date/Time   NA 141 09/11/2023 0934   K 4.8 09/11/2023 0934   CL 106 09/11/2023 0934   CO2 19 (L) 09/11/2023 0934   GLUCOSE 101 (H) 09/11/2023 0934   GLUCOSE 244 (H) 05/27/2023 2310   BUN 22 09/11/2023 0934   CREATININE 0.86 09/11/2023 0934   CALCIUM  9.7 09/11/2023 0934   GFRNONAA >60 05/27/2023 2310   GFRAA 63 03/30/2020 1030   Lab Results  Component Value Date   HGBA1C 5.9 (H) 09/11/2023   HGBA1C 5.6 06/16/2017   Lab Results  Component Value Date   INSULIN  10.5 09/11/2023   INSULIN  6.4 06/16/2017   Lab Results  Component Value Date   TSH 1.030 09/11/2023   CBC    Component Value Date/Time   WBC 6.7 09/11/2023 0934  WBC 8.4 05/27/2023 2310   RBC 3.97 09/11/2023 0934   RBC 3.93 05/27/2023 2310   HGB 12.4 09/11/2023 0934   HCT 37.7 09/11/2023 0934   PLT 233 09/11/2023 0934   MCV 95 09/11/2023 0934   MCH 31.2 09/11/2023 0934   MCH 31.6 05/27/2023 2310   MCHC 32.9 09/11/2023 0934   MCHC 33.5 05/27/2023 2310   RDW 12.0 09/11/2023 0934   Iron Studies    Component Value Date/Time   IRON 117 10/17/2020 1013   IRON 100 01/25/2019 0853   TIBC 431.2 10/17/2020 1013   TIBC 328 01/25/2019 0853   FERRITIN 133.2 10/17/2020 1013   FERRITIN 54 01/25/2019 0853   IRONPCTSAT 27.1 10/17/2020 1013    IRONPCTSAT 30 01/25/2019 0853   Lipid Panel     Component Value Date/Time   CHOL 128 09/11/2023 0934   TRIG 79 09/11/2023 0934   HDL 70 09/11/2023 0934   CHOLHDL 1.7 03/25/2023 1100   CHOLHDL 3 11/30/2015 1028   VLDL 25.4 11/30/2015 1028   LDLCALC 43 09/11/2023 0934   Hepatic Function Panel     Component Value Date/Time   PROT 6.4 09/11/2023 0934   ALBUMIN  4.2 09/11/2023 0934   AST 18 09/11/2023 0934   ALT 14 09/11/2023 0934   ALKPHOS 78 09/11/2023 0934   BILITOT 0.2 09/11/2023 0934   BILIDIR 0.13 05/15/2021 1000      Component Value Date/Time   TSH 1.030 09/11/2023 0934   Nutritional Lab Results  Component Value Date   VD25OH 42.4 09/11/2023   VD25OH 90.1 10/15/2022   VD25OH 83.1 02/26/2022     Assessment and Plan Assessment & Plan Obesity Obesity management is ongoing. She follows the category one eating plan 75% of the time, is working on increasing whole foods intake, but is not meeting protein goals. She has not been exercising recently, gained one pound in the last month, and is not getting adequate sleep or hydration. - Encourage adherence to the category one eating plan. - Recommend increasing protein intake. - Advise on the importance of regular exercise, suggesting starting with one session per week, particularly utilizing the pool, which she enjoys. - Discuss the benefits of adequate sleep and hydration.  Prediabetes Prediabetes is being monitored. Glucose level is 101, and A1c is 5.9. She experiences stomach discomfort with metformin , so an extended-release formulation is recommended to improve tolerance. - Prescribe metformin  XR 500 mg at night to improve tolerance and manage prediabetes.  Vitamin D  deficiency Vitamin D  levels are below the goal of 50. She is not currently on vitamin D  supplementation. - Recommend over-the-counter vitamin D  supplementation at 1,000 to 2,000 IU per day.  Vitamin B12 deficiency Vitamin B12 levels are low, with a  target of around 500. Inadequate protein intake may contribute to the deficiency. - Recommend over-the-counter vitamin B12 supplementation at 1,000 mcg per day.    She was informed of the importance of frequent follow up visits to maximize her success with intensive lifestyle modifications for her multiple health conditions.    Louann Penton, MD

## 2023-10-01 ENCOUNTER — Encounter: Payer: Self-pay | Admitting: Dermatology

## 2023-10-02 ENCOUNTER — Ambulatory Visit (INDEPENDENT_AMBULATORY_CARE_PROVIDER_SITE_OTHER): Admitting: Dermatology

## 2023-10-02 ENCOUNTER — Encounter: Payer: Self-pay | Admitting: Dermatology

## 2023-10-02 VITALS — BP 124/60 | HR 61

## 2023-10-02 DIAGNOSIS — C44519 Basal cell carcinoma of skin of other part of trunk: Secondary | ICD-10-CM | POA: Diagnosis not present

## 2023-10-02 DIAGNOSIS — C4491 Basal cell carcinoma of skin, unspecified: Secondary | ICD-10-CM

## 2023-10-02 NOTE — Patient Instructions (Signed)

## 2023-10-02 NOTE — Progress Notes (Signed)
 Follow-Up Visit   Subjective  Courtney [name] is a 73 y.o. female who presents for the following: Excision of Superficial and Nodular Basal Cell Carcinoma on the chest-medial, referred Dr. Alm.  The following portions of the chart were reviewed this encounter and updated as appropriate: medications, allergies, medical history  Review of Systems:  No other skin or systemic complaints except as noted in HPI or Assessment and Plan.  Objective  Well appearing patient in no apparent distress; mood and affect are within normal limits.  A focused examination was performed of the following areas: Chest-medial Relevant physical exam findings are noted in the Assessment and Plan.   Chest - Medial [facility]) Healing biopsy site   Assessment & Plan   BASAL CELL CARCINOMA (BCC), UNSPECIFIED SITE Chest - Medial (Center) Skin excision  Excision method:  elliptical Lesion length (cm):  0.6 Lesion width (cm):  0.8 Margin per side (cm):  0.5 Total excision diameter (cm):  1.8 Informed consent: discussed and consent obtained   Timeout: patient name, date of birth, surgical site, and procedure verified   Procedure prep:  Patient was prepped and draped in usual sterile fashion Prep type:  Chlorhexidine Anesthesia: the lesion was anesthetized in a standard fashion   Anesthetic:  1% lidocaine w/ epinephrine 1-100,000 buffered w/ 8.4% NaHCO3 Instrument used: #15 blade   Hemostasis achieved with: suture, pressure and electrodesiccation   Hemostasis achieved with comment:  4.0 monocryl with dermabond and steri strips Outcome: patient tolerated procedure well with no complications   Post-procedure details: sterile dressing applied and wound care instructions given   Dressing type: bandage and petrolatum gauze   Additional details:  Final length 3.9  Skin repair Complexity:  Complex Final length (cm):  3.9 Informed consent: discussed and consent obtained   Timeout: patient name, date of birth,  surgical site, and procedure verified   Procedure prep:  Patient was prepped and draped in usual sterile fashion Prep type:  Chlorhexidine Anesthesia: the lesion was anesthetized in a standard fashion   Anesthetic:  1% lidocaine w/ epinephrine 1-100,000 buffered w/ 8.4% NaHCO3 Reason for type of repair: reduce tension to allow closure, preserve normal anatomy, preserve normal anatomical and functional relationships, avoid adjacent structures and allow side-to-side closure without requiring a flap or graft   Undermining: area extensively undermined   Subcutaneous layers (deep stitches):  Suture size:  4-0 Suture type: Monocryl (poliglecaprone 25)   Stitches:  Buried vertical mattress Fine/surface layer approximation (top stitches):  Suture type: cyanoacrylate tissue glue   Hemostasis achieved with: suture, pressure and electrodesiccation Outcome: patient tolerated procedure well with no complications   Post-procedure details: sterile dressing applied and wound care instructions given   Dressing type: bandage and pressure dressing    Specimen 1 - Surgical pathology Differential Diagnosis: BCC [id number] Check Margins: No  The surgical wound was then cleaned, prepped, and re-anesthetized as above. Wound edges were undermined extensively along at least one entire edge and at a distance equal to or greater than the width of the defect (see wound defect size above) in order to achieve closure and decrease wound tension and anatomic distortion. Redundant tissue repair including standing cone removal was performed. Hemostasis was achieved with electrocautery. Subcutaneous and epidermal tissues were approximated with the above sutures. The surgical site was then lightly scrubbed with sterile, saline-soaked gauze. Steri-strips were applied, and the area was then bandaged using Vaseline ointment, non-adherent gauze, gauze pads, and tape to provide an adequate pressure dressing. The patient tolerated  the procedure well, was given detailed written and verbal wound care instructions, and was discharged in good condition.   The patient will follow-up: 4 weeks.  Return in about 4 weeks (around 10/30/2023) for Mohs follow up .   Documentation: I have reviewed the above documentation for accuracy and completeness, and I agree with the above.  RUFUS CHRISTELLA HOLY, MD

## 2023-10-06 ENCOUNTER — Ambulatory Visit: Payer: Self-pay | Admitting: Dermatology

## 2023-10-06 LAB — SURGICAL PATHOLOGY

## 2023-10-16 ENCOUNTER — Other Ambulatory Visit (INDEPENDENT_AMBULATORY_CARE_PROVIDER_SITE_OTHER): Payer: Self-pay | Admitting: Family Medicine

## 2023-10-16 DIAGNOSIS — R7303 Prediabetes: Secondary | ICD-10-CM

## 2023-10-23 ENCOUNTER — Telehealth (INDEPENDENT_AMBULATORY_CARE_PROVIDER_SITE_OTHER): Payer: Self-pay | Admitting: Family Medicine

## 2023-10-23 ENCOUNTER — Encounter (INDEPENDENT_AMBULATORY_CARE_PROVIDER_SITE_OTHER): Payer: Self-pay | Admitting: Family Medicine

## 2023-10-23 ENCOUNTER — Ambulatory Visit (INDEPENDENT_AMBULATORY_CARE_PROVIDER_SITE_OTHER): Admitting: Family Medicine

## 2023-10-23 VITALS — BP 105/65 | HR 51 | Temp 97.7°F | Ht 60.0 in | Wt 161.0 lb

## 2023-10-23 DIAGNOSIS — E66811 Obesity, class 1: Secondary | ICD-10-CM | POA: Diagnosis not present

## 2023-10-23 DIAGNOSIS — R7303 Prediabetes: Secondary | ICD-10-CM

## 2023-10-23 DIAGNOSIS — E161 Other hypoglycemia: Secondary | ICD-10-CM

## 2023-10-23 DIAGNOSIS — Z6831 Body mass index (BMI) 31.0-31.9, adult: Secondary | ICD-10-CM

## 2023-10-23 DIAGNOSIS — Z6830 Body mass index (BMI) 30.0-30.9, adult: Secondary | ICD-10-CM

## 2023-10-23 DIAGNOSIS — I10 Essential (primary) hypertension: Secondary | ICD-10-CM

## 2023-10-23 DIAGNOSIS — R5383 Other fatigue: Secondary | ICD-10-CM

## 2023-10-23 DIAGNOSIS — E538 Deficiency of other specified B group vitamins: Secondary | ICD-10-CM | POA: Diagnosis not present

## 2023-10-23 MED ORDER — BLOOD GLUCOSE MONITORING SUPPL KIT: PACK | 0 refills | Status: DC

## 2023-10-23 NOTE — Addendum Note (Signed)
 Addended by: LAFE BAKER CROME on: 10/23/2023 03:21 PM   Modules accepted: Orders

## 2023-10-23 NOTE — Progress Notes (Signed)
 Office: [phone]  /  Fax: [phone]  WEIGHT SUMMARY AND BIOMETRICS  Anthropometric Measurements Height: 5' (1.524 m) Weight: 161 lb (73 kg) BMI (Calculated): 31.44 Weight at Last Visit: 163 lb Weight Lost Since Last Visit: 2 lb Weight Gained Since Last Visit: 0 Starting Weight: 157 lb Total Weight Loss (lbs): 0 lb (0 kg) Peak Weight: 162 lb   Body Composition  Body Fat %: 42.8 % Fat Mass (lbs): 69 lbs Muscle Mass (lbs): 87.6 lbs Total Body Water (lbs): 60.6 lbs Visceral Fat Rating : 13   Other Clinical Data Fasting: no Labs: no Today's Visit #: 64 Starting Date: 06/16/17    Chief Complaint: OBESITY  History of Present Illness Courtney [name] is a 73 year old female who presents for obesity treatment and progress assessment.  She adheres to the category one eating plan about 80% of the time, focusing on increased intake of fruits, vegetables, and protein while avoiding skipping meals. However, she reports inadequate hydration and insufficient sleep, averaging six to seven hours per night. Her physical activity includes walking for 30 minutes twice a week, resulting in a two-pound weight loss over the past month.  She takes ramipril  10 mg and amlodipine  5 mg.  She discontinued metformin  due to gastrointestinal side effects. Her most recent A1c is 5.9, a slight increase from 5.8, with a history of reaching 6.0 in 2023.  She experiences significant fatigue since her last visit, characterized by a need to nap after meals and excessive tiredness during simple activities. Her sleep is fragmented, with difficulty maintaining sleep throughout the night.  She has been taking a daily vitamin B12 supplement of 1000 mcg since her last visit. She has not been on any other B12 supplementation prior to this.      PHYSICAL EXAM:  Blood pressure 105/65, pulse (!) 51, temperature 97.7 F (36.5 C), height 5' (1.524 m), weight 161 lb (73 kg), SpO2 96%. Body mass index is  31.44 kg/m.  DIAGNOSTIC DATA REVIEWED:  BMET    Component Value Date/Time   NA 141 09/11/2023 0934   K 4.8 09/11/2023 0934   CL 106 09/11/2023 0934   CO2 19 (L) 09/11/2023 0934   GLUCOSE 101 (H) 09/11/2023 0934   GLUCOSE 244 (H) 05/27/2023 2310   BUN 22 09/11/2023 0934   CREATININE 0.86 09/11/2023 0934   CALCIUM  9.7 09/11/2023 0934   GFRNONAA >60 05/27/2023 2310   GFRAA 63 03/30/2020 1030   Lab Results  Component Value Date   HGBA1C 5.9 (H) 09/11/2023   HGBA1C 5.6 06/16/2017   Lab Results  Component Value Date   INSULIN  10.5 09/11/2023   INSULIN  6.4 06/16/2017   Lab Results  Component Value Date   TSH 1.030 09/11/2023   CBC    Component Value Date/Time   WBC 6.7 09/11/2023 0934   WBC 8.4 05/27/2023 2310   RBC 3.97 09/11/2023 0934   RBC 3.93 05/27/2023 2310   HGB 12.4 09/11/2023 0934   HCT 37.7 09/11/2023 0934   PLT 233 09/11/2023 0934   MCV 95 09/11/2023 0934   MCH 31.2 09/11/2023 0934   MCH 31.6 05/27/2023 2310   MCHC 32.9 09/11/2023 0934   MCHC 33.5 05/27/2023 2310   RDW 12.0 09/11/2023 0934   Iron Studies    Component Value Date/Time   IRON 117 10/17/2020 1013   IRON 100 01/25/2019 0853   TIBC 431.2 10/17/2020 1013   TIBC 328 01/25/2019 0853   FERRITIN 133.2 10/17/2020 1013   FERRITIN  54 01/25/2019 0853   IRONPCTSAT 27.1 10/17/2020 1013   IRONPCTSAT 30 01/25/2019 0853   Lipid Panel     Component Value Date/Time   CHOL 128 09/11/2023 0934   TRIG 79 09/11/2023 0934   HDL 70 09/11/2023 0934   CHOLHDL 1.7 03/25/2023 1100   CHOLHDL 3 11/30/2015 1028   VLDL 25.4 11/30/2015 1028   LDLCALC 43 09/11/2023 0934   Hepatic Function Panel     Component Value Date/Time   PROT 6.4 09/11/2023 0934   ALBUMIN  4.2 09/11/2023 0934   AST 18 09/11/2023 0934   ALT 14 09/11/2023 0934   ALKPHOS 78 09/11/2023 0934   BILITOT 0.2 09/11/2023 0934   BILIDIR 0.13 05/15/2021 1000      Component Value Date/Time   TSH 1.030 09/11/2023 0934   Nutritional Lab  Results  Component Value Date   VD25OH 42.4 09/11/2023   VD25OH 90.1 10/15/2022   VD25OH 83.1 02/26/2022     Assessment and Plan Assessment & Plan Obesity, class 1 Obesity class 1 is being managed with a category one eating plan, which she follows about 80% of the time. She is working on increasing her intake of fruits, vegetables, and protein, and avoiding skipping meals. She exercises by walking 30 minutes twice a week. She has lost 2 pounds in the last month.  Essential hypertension Hypertension is primarily managed with diet, exercise, and weight loss. She is on ramipril  10 mg and amlodipine  5 mg. Blood pressure is well controlled at 105/65 mmHg. - Continue diet, exercise and weight loss as discussed today as an important part of the treatment plan  Prediabetes Prediabetes is managed with diet and exercise. She previously used metformin  but discontinued due to gastrointestinal side effects. Hemoglobin A1c is 5.9%, slightly elevated from previous values. Symptoms of fatigue and postprandial tiredness may be related to rapid changes in blood glucose levels. - Write prescription for glucometer and test strips. - Instruct to check blood glucose 1, 2, and 3 hours postprandially for one meal per day. - Advise to try a low-carb diet for a few days after a week of regular diet to assess impact on symptoms. - Continue diet, exercise and weight loss as discussed today as an important part of the treatment plan   Suspected postprandial hypoglycemia Suspected postprandial hypoglycemia due to symptoms of fatigue and need for naps after meals. Symptoms may be related to rapid changes in blood glucose levels postprandially.  Check glucose 1, 2 and 3 hours after starting lunch and bring in log to next visit  Other fatigue Fatigue has been present since the last visit. Sleep is fragmented, with difficulty staying asleep and feeling tired during the day. She averages about 6-7 hours of sleep per  night, which is not always adequate. Bentyl  is occasionally used at night to aid sleep. - Encourage improving sleep hygiene to achieve 7-9 hours of sleep per night, will follow  Other specified vitamin B12 deficiency Vitamin B12 deficiency is suspected due to low levels. She is currently taking 1000 mcg of B12 daily since the last visit. B12 levels will be rechecked at the end of October. - Continue 1000 mcg of B12 daily. - Plan to recheck B12 levels at the end of October.     I personally spent a total of 40 minutes in the care of the patient today including preparing to see the patient, reviewing separately obtained history, performing a medically appropriate evaluation of current problems, placing orders in the EMR, documenting clinical information  in the EMR, and customized nutritional counseling for their specific health and social needs.    Fedora was informed of the importance of frequent follow up visits to maximize her success with intensive lifestyle modifications for her obesity and obesity related health conditions as recommended by USPSTF and CMS guidelines   Louann Penton, MD

## 2023-10-24 ENCOUNTER — Encounter (INDEPENDENT_AMBULATORY_CARE_PROVIDER_SITE_OTHER): Payer: Self-pay | Admitting: Family Medicine

## 2023-10-24 DIAGNOSIS — E161 Other hypoglycemia: Secondary | ICD-10-CM

## 2023-10-27 MED ORDER — GLUCOSE BLOOD VI STRP: ORAL_STRIP | 12 refills | Status: DC

## 2023-10-27 MED ORDER — LANCETS MISC: 0 refills | Status: DC

## 2023-10-28 ENCOUNTER — Encounter: Payer: Self-pay | Admitting: Emergency Medicine

## 2023-11-03 NOTE — Progress Notes (Unsigned)
 Courtney [name] [id number] [birth year]   Chief Complaint:  Referring Provider: Purcell Emil Schanz, * Primary GI MD: Dr. Avram  HPI: Courtney [name] is a 73 y.o. female with past medical history of CAD s/p MI 2009 and stent placement, mild/moderate aortic valve stenosis (on Echo 06/2023 Vmax 2.4 m/s, MG , AVA 1. 5cm^ 2, skin cancer, depression, HTN, HLD, GERD, vitamin B12 deficiency, vitamin D  deficiency who presents today to discuss colonoscopy.    Last seen in office by Harlene Mail, PA-C 11/2020 for complaint of constipation and lower abdominal pain.  Episodes of lower abdominal pain seemed to be associated with constipation as she felt fine in between these episodes.  No rectal bleeding or weight loss.  Was treated with MiraLAX 1 capful daily and if symptoms persisted could consider CT versus repeat colonoscopy.  Her last colonoscopy was in January 2015 by Dr. Dyane at [facility] at which time she had one 6 mm polyp that was removed and internal hemorrhoids.  The polyp ended up being benign polypoid mucosa on pathology.   Had been scheduled for colonoscopy with Dr. Avram earlier this year but had to cancel due to diagnosis of pneumonia.   Blood thinner?   Previous GI Procedures/Imaging      Past Medical History:  Diagnosis Date   Allergy    Aortic valve sclerosis    echo 12/2008, soft systolic murmur   Arthritis    in right hip   Back pain    Basal cell carcinoma 05/31/2016   bcc left forehead  tx exc   BCC (basal cell carcinoma of skin) 08/18/2023   chest - medial (center) - Ex needed    Blood transfusion without reported diagnosis    Carotid artery disease (HCC)    40-59% bilateral, doppler December 2010   Cataract    Constipation    Coronary artery disease    a. BMS-mid RCA 11/2007 b. stress echo 02/2009 subtle inferior HK, lateral ST depressions with stress c. follow-up cath 02/2009: RCA stent patent, severe but stable stenosis of distal posterior lateral  branch RCA. LV normal, med tx unless more sx c. ETT Myoview  08/27/12: negative for ischemia; EF 64%   Decreased hearing    Depression    Mild, August, 2012   Dyslipidemia    Easy bruising    Ejection fraction    EF normal, catheterization, 2011 /  EF 55%, echo, 2010   Emphysema of lung (HCC)    Fluid overload    Mild, stable since hospitalization in the past   GERD (gastroesophageal reflux disease)    occarionally, takes Pepcid  over the counter   Heart murmur    has, occasional PVC's   History of right hip replacement    Hyperlipidemia    Hypertension    Joint pain    Myocardial infarction (HCC)    2009   Palpitations    PVC (premature ventricular contraction)    per prior Holter monitor   Right hip pain 12/2009   Shellfish allergy    Stress fracture    Left foot   Trouble in sleeping    Vitamin B12 deficiency    Vitamin D  deficiency     Past Surgical History:  Procedure Laterality Date   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/01/2005   BREAST BIOPSY Left    carcinoid tumor removal     colon-   CARDIAC CATHETERIZATION  1/211   Patent RCA stent, stenotic PLB supplying small distribution; medically managed  CATARACT EXTRACTION, BILATERAL     CORONARY ANGIOPLASTY WITH STENT PLACEMENT  11/2007   BMS-mid RCA   DIAGNOSTIC LAPAROSCOPY     1982 for endometriosis   DILATION AND CURETTAGE OF UTERUS  2005   JOINT REPLACEMENT  2016   SKIN CANCER EXCISION     TOTAL HIP ARTHROPLASTY Right 07/08/2014   Procedure: RIGHT TOTAL HIP ARTHROPLASTY ANTERIOR APPROACH;  Surgeon: Lonni CINDERELLA Poli, MD;  Location: [facility];  Service: Orthopedics;  Laterality: Right;   WISDOM TOOTH EXTRACTION      Current Outpatient Medications  Medication Sig Dispense Refill   amLODipine  (NORVASC ) 5 MG tablet Take 1 tablet (5 mg total) by mouth daily. 90 tablet 3   aspirin  81 MG tablet Take 81 mg by mouth daily.     benzonatate  (TESSALON ) 100 MG capsule Take 1 capsule (100 mg total) by mouth 2 (two) times daily as  needed for cough. 20 capsule 0   Blood Glucose Monitoring Suppl KIT Check BS 1-2 hours after starting lunch 1 kit 0   Cholecalciferol (VITAMIN D3) 25 MCG (1000 UT) CAPS Take 1 capsule (1,000 Units total) by mouth daily.     cyanocobalamin  (VITAMIN B12) 1000 MCG tablet Take 1 tablet (1,000 mcg total) by mouth daily.     Evolocumab  (REPATHA  SURECLICK) 140 MG/ML SOAJ Inject 140 mg into the skin every 14 (fourteen) days. 6 mL 3   ezetimibe  (ZETIA ) 10 MG tablet TAKE 1 TABLET BY MOUTH EVERY DAY 90 tablet 3   gabapentin  (NEURONTIN ) 300 MG capsule Take 1 capsule (300 mg total) by mouth at bedtime. 90 capsule 3   glucose blood test strip Use as instructed 100 each 12   ipratropium-albuterol  (DUONEB) 0.5-2.5 (3) MG/3ML SOLN Take 3 mLs by nebulization every 6 (six) hours as needed. 360 mL 3   Lancets MISC Used as directed 100 each 0   metoprolol  tartrate (LOPRESSOR ) 25 MG tablet Take 1 tablet (25 mg total) by mouth 2 (two) times daily. 180 tablet 3   nitroGLYCERIN  (NITROSTAT ) 0.4 MG SL tablet Place 1 tablet (0.4 mg total) under the tongue every 5 (five) minutes as needed for chest pain. 25 tablet 11   pantoprazole  (PROTONIX ) 40 MG tablet TAKE 1 TABLET BY MOUTH EVERY DAY 90 tablet 3   ramipril  (ALTACE ) 10 MG capsule TAKE 1 CAPSULE BY MOUTH EVERY DAY 90 capsule 3   rosuvastatin  (CRESTOR ) 40 MG tablet TAKE 1 TABLET BY MOUTH EVERY DAY 90 tablet 3   No current facility-administered medications for this visit.    Allergies as of 11/04/2023 - Review Complete 10/23/2023  Allergen Reaction Noted   Erythromycin Hives    Penicillins Hives    Shellfish allergy Hives 11/10/2014   Doxycycline  Rash 10/16/2022    Family History  Problem Relation Age of Onset   Stroke Mother    Hyperlipidemia Mother    Hypertension Mother    Heart disease Mother    Obesity Mother    Arthritis Mother    COPD Mother    Varicose Veins Mother    Heart attack Father 29   Hyperlipidemia Father    Hypertension Father    Sudden  death Father    Heart disease Father    Heart disease Sister    COPD Sister    Hyperlipidemia Sister    Heart attack Paternal Grandfather    Vision loss Maternal Aunt    BRCA 1/2 Neg Hx    Breast cancer Neg Hx    Esophageal cancer Neg Hx  Colon cancer Neg Hx    Pancreatic cancer Neg Hx    Liver disease Neg Hx    Stomach cancer Neg Hx    Colon polyps Neg Hx    Rectal cancer Neg Hx     Social History   Tobacco Use   Smoking status: Never   Smokeless tobacco: Never  Vaping Use   Vaping status: Never Used  Substance Use Topics   Alcohol use: Yes    Comment: 1 glass of wine 3x/week   Drug use: No     Review of Systems:    Constitutional: No weight loss, fever, chills, weakness or fatigue Eyes: No change in vision Ears, Nose, Throat:  No change in hearing or congestion Skin: No rash or itching Cardiovascular: No chest pain, chest pressure or palpitations   Respiratory: No SOB or cough Gastrointestinal: See HPI and otherwise negative Genitourinary: No dysuria or change in urinary frequency Neurological: No headache, dizziness or syncope Musculoskeletal: No new muscle or joint pain Hematologic: No bleeding or bruising    Physical Exam:  Vital signs: There were no vitals taken for this visit.  Constitutional: NAD, Well developed, Well nourished, alert and cooperative Head:  Normocephalic and atraumatic.  Eyes: No scleral icterus. Conjunctiva pink. Mouth: No oral lesions. Respiratory: Respirations even and unlabored. Lungs clear to auscultation bilaterally.  No wheezes, crackles, or rhonchi.  Cardiovascular:  Regular rate and rhythm. No murmurs. No peripheral edema. Gastrointestinal:  Soft, nondistended, nontender. No rebound or guarding. Normal bowel sounds. No appreciable masses or hepatomegaly. Rectal:  Not performed.  Neurologic:  Alert and oriented x4;  grossly normal neurologically.  Skin:   Dry and intact without significant lesions or rashes. Psychiatric:  Oriented to person, place and time. Demonstrates good judgement and reason without abnormal affect or behaviors.   RELEVANT LABS AND IMAGING: CBC    Component Value Date/Time   WBC 6.7 09/11/2023 0934   WBC 8.4 05/27/2023 2310   RBC 3.97 09/11/2023 0934   RBC 3.93 05/27/2023 2310   HGB 12.4 09/11/2023 0934   HCT 37.7 09/11/2023 0934   PLT 233 09/11/2023 0934   MCV 95 09/11/2023 0934   MCH 31.2 09/11/2023 0934   MCH 31.6 05/27/2023 2310   MCHC 32.9 09/11/2023 0934   MCHC 33.5 05/27/2023 2310   RDW 12.0 09/11/2023 0934   LYMPHSABS 3.2 (H) 09/11/2023 0934   MONOABS 0.5 07/25/2022 1442   EOSABS 0.3 09/11/2023 0934   BASOSABS 0.1 09/11/2023 0934    CMP     Component Value Date/Time   NA 141 09/11/2023 0934   K 4.8 09/11/2023 0934   CL 106 09/11/2023 0934   CO2 19 (L) 09/11/2023 0934   GLUCOSE 101 (H) 09/11/2023 0934   GLUCOSE 244 (H) 05/27/2023 2310   BUN 22 09/11/2023 0934   CREATININE 0.86 09/11/2023 0934   CALCIUM  9.7 09/11/2023 0934   PROT 6.4 09/11/2023 0934   ALBUMIN  4.2 09/11/2023 0934   AST 18 09/11/2023 0934   ALT 14 09/11/2023 0934   ALKPHOS 78 09/11/2023 0934   BILITOT 0.2 09/11/2023 0934   GFRNONAA >60 05/27/2023 2310   GFRAA 63 03/30/2020 1030   Echocardiogram 06/30/2023 1. Left ventricular ejection fraction, by estimation, is 65 to 70% . Left ventricular ejection fraction by 3D volume is 69 % . The left ventricle has normal function. The left ventricle has no regional wall motion abnormalities. Left ventricular diastolic parameters were normal. The average left ventricular global longitudinal strain is - 19. 3 % .  The global longitudinal strain is normal. 2. Right ventricular systolic function is normal. The right ventricular size is normal. There is normal pulmonary artery systolic pressure. The estimated right ventricular systolic pressure is 30. 0 mmHg. 3. The mitral valve is normal in structure. Trivial mitral valve regurgitation. 4. The aortic valve is  tricuspid. Aortic valve regurgitation is trivial. Mild to moderate aortic valve stenosis. Mild AS by gradients ( Vmax 2. 4 m/ s, MG ) , moderate by AVA ( 1. 5cm^ 2) and DI ( 0. 48) 5. The inferior vena cava is normal in size with greater than 50% respiratory variability, suggesting right atrial pressure of 3 mmHg.  Assessment/Plan:       Courtney Furbish, PA-C [facility] 11/03/2023, 5:02 PM  Patient Care Team: Purcell Emil Schanz, MD as PCP - General (Internal Medicine) [place], Peter M, MD as PCP - Cardiology (Cardiology) Verdon Louann BIRCH, MD as Consulting Physician (Family Medicine) [place], Peter M, MD as Consulting Physician (Cardiology) Cleotilde Elspeth CROME, OD as Consulting Physician (Optometry) Livingston Rigg, MD as Consulting Physician (Dermatology)

## 2023-11-04 ENCOUNTER — Telehealth: Payer: Self-pay | Admitting: Cardiology

## 2023-11-04 ENCOUNTER — Other Ambulatory Visit (HOSPITAL_COMMUNITY): Payer: Self-pay

## 2023-11-04 ENCOUNTER — Telehealth: Payer: Self-pay

## 2023-11-04 ENCOUNTER — Encounter: Payer: Self-pay | Admitting: Gastroenterology

## 2023-11-04 ENCOUNTER — Telehealth: Payer: Self-pay | Admitting: Pharmacy Technician

## 2023-11-04 ENCOUNTER — Ambulatory Visit

## 2023-11-04 ENCOUNTER — Ambulatory Visit (INDEPENDENT_AMBULATORY_CARE_PROVIDER_SITE_OTHER): Admitting: Gastroenterology

## 2023-11-04 ENCOUNTER — Encounter: Payer: Self-pay | Admitting: Cardiology

## 2023-11-04 ENCOUNTER — Ambulatory Visit (INDEPENDENT_AMBULATORY_CARE_PROVIDER_SITE_OTHER)

## 2023-11-04 VITALS — BP 120/60 | HR 60 | Ht 60.0 in | Wt 166.0 lb

## 2023-11-04 DIAGNOSIS — Z86711 Personal history of pulmonary embolism: Secondary | ICD-10-CM

## 2023-11-04 DIAGNOSIS — E785 Hyperlipidemia, unspecified: Secondary | ICD-10-CM

## 2023-11-04 DIAGNOSIS — I2510 Atherosclerotic heart disease of native coronary artery without angina pectoris: Secondary | ICD-10-CM

## 2023-11-04 DIAGNOSIS — K219 Gastro-esophageal reflux disease without esophagitis: Secondary | ICD-10-CM | POA: Diagnosis not present

## 2023-11-04 DIAGNOSIS — Z23 Encounter for immunization: Secondary | ICD-10-CM

## 2023-11-04 DIAGNOSIS — I25110 Atherosclerotic heart disease of native coronary artery with unstable angina pectoris: Secondary | ICD-10-CM

## 2023-11-04 DIAGNOSIS — I358 Other nonrheumatic aortic valve disorders: Secondary | ICD-10-CM

## 2023-11-04 DIAGNOSIS — Z1211 Encounter for screening for malignant neoplasm of colon: Secondary | ICD-10-CM

## 2023-11-04 MED ORDER — NA SULFATE-K SULFATE-MG SULF 17.5-3.13-1.6 GM/177ML PO SOLN: 1.0000 | Freq: Once | ORAL | 0 refills | Status: AC

## 2023-11-04 MED ORDER — REPATHA SURECLICK 140 MG/ML ~~LOC~~ SOAJ: 140.0000 mg | SUBCUTANEOUS | 3 refills | Status: AC

## 2023-11-04 NOTE — Patient Instructions (Signed)
 You have been scheduled for a colonoscopy. Please follow written instructions given to you at your visit today.   If you use inhalers (even only as needed), please bring them with you on the day of your procedure.  DO NOT TAKE 7 DAYS PRIOR TO TEST- Trulicity (dulaglutide) Ozempic, Wegovy (semaglutide) Mounjaro (tirzepatide) Bydureon Bcise (exanatide extended release)  DO NOT TAKE 1 DAY PRIOR TO YOUR TEST Rybelsus (semaglutide) Adlyxin (lixisenatide) Victoza (liraglutide) Byetta (exanatide) ___________________________________________________________________________  _______________________________________________________  If your blood pressure at your visit was 140/90 or greater, please contact your primary care physician to follow up on this.  _______________________________________________________  If you are age 75 or older, your body mass index should be between 23-30. Your Body mass index is 32.42 kg/m. If this is out of the aforementioned range listed, please consider follow up with your Primary Care Provider.  If you are age 72 or younger, your body mass index should be between 19-25. Your Body mass index is 32.42 kg/m. If this is out of the aformentioned range listed, please consider follow up with your Primary Care Provider.   ________________________________________________________  The [facility] providers would like to encourage you to use MYCHART to communicate with providers for non-urgent requests or questions.  Due to long hold times on the telephone, sending your provider a message by [facility] may be a faster and more efficient way to get a response.  Please allow 48 business hours for a response.  Please remember that this is for non-urgent requests.  _______________________________________________________  [facility] is using a team-based approach to care.  Your team is made up of your doctor and two to three APPS. Our APPS (Nurse Practitioners and  Physician Assistants) work with your physician to ensure care continuity for you. They are fully qualified to address your health concerns and develop a treatment plan. They communicate directly with your gastroenterologist to care for you. Seeing the Advanced Practice Practitioners on your physician's team can help you by facilitating care more promptly, often allowing for earlier appointments, access to diagnostic testing, procedures, and other specialty referrals.

## 2023-11-04 NOTE — Telephone Encounter (Signed)
 Patient returned Pre-op call.

## 2023-11-04 NOTE — Telephone Encounter (Signed)
 1st attempt to reach pt regarding surgical clearance and the need for an TELE appointment.  Left pt a detailed message to call back and get that scheduled.

## 2023-11-04 NOTE — Progress Notes (Signed)
 Camie,  I reviewed your note, and our anesthesia provider has also done so and replied with clearance for the [facility].  However, this patient of Dr. Darilyn is on my schedule for the colonoscopy and the note was routed to me.  Is that correct?  I have copied Dr. Avram on this as well.  - H. Danis

## 2023-11-04 NOTE — Telephone Encounter (Signed)
 Pt c/o medication issue:  1. Name of Medication:   Evolocumab  (REPATHA  SURECLICK) 140 MG/ML SOAJ    2. How are you currently taking this medication (dosage and times per day)?  Inject 140 mg into the skin every 14 (fourteen) days.      3. Are you having a reaction (difficulty breathing--STAT)? No  4. What is your medication issue? Pt is requesting a callback from nurse regarding grant for this medication and refill. Please advise

## 2023-11-04 NOTE — Telephone Encounter (Signed)
 Spoke with pt who states that she gets a grant for her Repatha . Pt reports that is time to renew the grant and that pharm D normally helps with this. She is asking that they give her a call. Please advise.

## 2023-11-04 NOTE — Telephone Encounter (Signed)
   Name: Courtney [name]  DOB: 07/13/1950  MRN: [id number]  Primary Cardiologist: Peter [place], MD   Preoperative team, please contact this patient and set up a phone call appointment for further preoperative risk assessment. Please obtain consent and complete medication review. Thank you for your help.  I confirm that guidance regarding antiplatelet and oral anticoagulation therapy has been completed and, if necessary, noted below (none requested).   I also confirmed the patient resides in the state of [place] . As per [facility] [employer] telemedicine laws, the patient must reside in the state in which the provider is licensed.   Damien JAYSON Braver, NP 11/04/2023, 2:10 PM [facility]

## 2023-11-04 NOTE — Telephone Encounter (Signed)
 Patient Advocate Encounter   The patient was approved for a [employer] grant that will help cover the cost of Repatha  Total amount awarded, 2500.00.  Effective: 12/06/23 - 12/04/24   APW:[id number] ERW:[id number] Hmnle:[id number] PI:[id number]  [employer] ID: [id number]   Pharmacy provided with approval and processing information. Patient informed via mychart

## 2023-11-04 NOTE — Telephone Encounter (Signed)
 [facility] Pre-operative Risk Assessment     Request for surgical clearance:     Endoscopy Procedure  What type of surgery is being performed?     colon  When is this surgery scheduled?     12/26/23  What type of clearance is required ?  cardiac  Are there any medications that need to be held prior to surgery and how long? No medication. Cardiac clearance d/t history of PE in April 2025.   Practice name and name of physician performing surgery?      [facility]  What is your office phone and fax number?      Phone- [phone]  Fax- [phone]  Anesthesia type (None, local, MAC, general) ?       MAC   Please route your response to Corean [place], [facility]

## 2023-11-04 NOTE — Progress Notes (Signed)
 Patient visits today to receive her FLU HIGH DOSE injection/vaccine. Patient was informed and tolerated well. Patient was notified to reach out to us  if needed.

## 2023-11-05 ENCOUNTER — Telehealth: Payer: Self-pay | Admitting: Gastroenterology

## 2023-11-05 ENCOUNTER — Telehealth: Payer: Self-pay

## 2023-11-05 NOTE — Telephone Encounter (Signed)
 Courtney [name],  This patient we scheduled yesterday for procedure with Dr. Legrand is actually established with Dr. Avram. We will need to cancel colonoscopy with Dr. Legrand and reschedule with Dr. Avram. She has been cleared for procedure in the [facility], though still would like cardiac clearance which I know you are working on.  Thank you!

## 2023-11-05 NOTE — Telephone Encounter (Signed)
 Pharmacy Patient Advocate Encounter  Received notification from [employer] that Prior Authorization for Repatha  has been APPROVED from 11/04/23 to 11/02/24   PA #/Case ID/Reference #: [id number]

## 2023-11-05 NOTE — Telephone Encounter (Signed)
 Appointment scheduled for 12/22/2023 @ 10am. Med req and consent are complete.

## 2023-11-05 NOTE — Telephone Encounter (Signed)
  Patient Consent for Virtual Visit         Courtney [name] has provided verbal consent on 11/05/2023 for a virtual visit (video or telephone).  Appointment scheduled for 12/22/2023 @ 10am. Med req and consent are complete.   CONSENT FOR VIRTUAL VISIT FOR:  Courtney [name]  By participating in this virtual visit I agree to the following:  I hereby voluntarily request, consent and authorize [facility] and its employed or contracted physicians, physician assistants, nurse practitioners or other licensed health care professionals (the Practitioner), to provide me with telemedicine health care services (the "Services) as deemed necessary by the treating Practitioner. I acknowledge and consent to receive the Services by the Practitioner via telemedicine. I understand that the telemedicine visit will involve communicating with the Practitioner through live audiovisual communication technology and the disclosure of certain medical information by electronic transmission. I acknowledge that I have been given the opportunity to request an in-person assessment or other available alternative prior to the telemedicine visit and am voluntarily participating in the telemedicine visit.  I understand that I have the right to withhold or withdraw my consent to the use of telemedicine in the course of my care at any time, without affecting my right to future care or treatment, and that the Practitioner or I may terminate the telemedicine visit at any time. I understand that I have the right to inspect all information obtained and/or recorded in the course of the telemedicine visit and may receive copies of available information for a reasonable fee.  I understand that some of the potential risks of receiving the Services via telemedicine include:  Delay or interruption in medical evaluation due to technological equipment failure or disruption; Information transmitted may not be sufficient (e.g. poor resolution of  images) to allow for appropriate medical decision making by the Practitioner; and/or  In rare instances, security protocols could fail, causing a breach of personal health information.  Furthermore, I acknowledge that it is my responsibility to provide information about my medical history, conditions and care that is complete and accurate to the best of my ability. I acknowledge that Practitioner's advice, recommendations, and/or decision may be based on factors not within their control, such as incomplete or inaccurate data provided by me or distortions of diagnostic images or specimens that may result from electronic transmissions. I understand that the practice of medicine is not an exact science and that Practitioner makes no warranties or guarantees regarding treatment outcomes. I acknowledge that a copy of this consent can be made available to me via my patient portal [facility] MyChart), or I can request a printed copy by calling the office of [facility].    I understand that my insurance will be billed for this visit.   I have read or had this consent read to me. I understand the contents of this consent, which adequately explains the benefits and risks of the Services being provided via telemedicine.  I have been provided ample opportunity to ask questions regarding this consent and the Services and have had my questions answered to my satisfaction. I give my informed consent for the services to be provided through the use of telemedicine in my medical care

## 2023-11-07 NOTE — Telephone Encounter (Signed)
 Spoke with pt and rescheduled colon for 01/01/24 @ 10 am with Dr Avram. New instructions sent.

## 2023-11-13 DIAGNOSIS — H903 Sensorineural hearing loss, bilateral: Secondary | ICD-10-CM | POA: Diagnosis not present

## 2023-11-17 ENCOUNTER — Encounter: Payer: Self-pay | Admitting: Emergency Medicine

## 2023-11-17 ENCOUNTER — Ambulatory Visit: Admitting: Emergency Medicine

## 2023-11-17 VITALS — BP 130/80 | HR 50 | Temp 97.6°F | Ht 60.0 in | Wt 166.0 lb

## 2023-11-17 DIAGNOSIS — I2510 Atherosclerotic heart disease of native coronary artery without angina pectoris: Secondary | ICD-10-CM | POA: Diagnosis not present

## 2023-11-17 DIAGNOSIS — I358 Other nonrheumatic aortic valve disorders: Secondary | ICD-10-CM

## 2023-11-17 DIAGNOSIS — R7303 Prediabetes: Secondary | ICD-10-CM

## 2023-11-17 DIAGNOSIS — I10 Essential (primary) hypertension: Secondary | ICD-10-CM | POA: Diagnosis not present

## 2023-11-17 DIAGNOSIS — E785 Hyperlipidemia, unspecified: Secondary | ICD-10-CM | POA: Diagnosis not present

## 2023-11-17 DIAGNOSIS — I495 Sick sinus syndrome: Secondary | ICD-10-CM | POA: Insufficient documentation

## 2023-11-17 NOTE — Assessment & Plan Note (Signed)
 Chronic stable condition Continue Zetia  10 mg daily and rosuvastatin  40 mg daily Also on Repatha  Diet and nutrition discussed

## 2023-11-17 NOTE — Assessment & Plan Note (Signed)
 Since her last visit she was able to follow-up with pulmonary Was advised to continue and finish Eliquis .  No longer taking it.

## 2023-11-17 NOTE — Progress Notes (Signed)
 Courtney [name] 73 y.o.   Chief Complaint  Patient presents with   Medical Management of Chronic Issues    6 month f/u    HISTORY OF PRESENT ILLNESS: This is a 73 y.o. female here for 36-month follow-up of chronic medical conditions No longer on Eliquis  Overall doing well.  Has no complaints or medical concerns today. Scheduled for colonoscopy next November. BP Readings from Last 3 Encounters:  11/17/23 130/80  11/04/23 120/60  10/23/23 105/65   Wt Readings from Last 3 Encounters:  11/17/23 166 lb (75.3 kg)  11/04/23 166 lb (75.3 kg)  10/23/23 161 lb (73 kg)     HPI   Prior to Admission medications   Medication Sig Start Date End Date Taking? Authorizing Provider  amLODipine  (NORVASC ) 5 MG tablet Take 1 tablet (5 mg total) by mouth daily. 02/17/23 02/17/24 Yes Norleen Lynwood ORN, MD  aspirin  81 MG tablet Take 81 mg by mouth daily.   Yes [provider]  Blood Glucose Monitoring Suppl KIT Check BS 1-2 hours after starting lunch 10/23/23  Yes Verdon, Caren D, MD  Cholecalciferol (VITAMIN D3) 25 MCG (1000 UT) CAPS Take 1 capsule (1,000 Units total) by mouth daily. 09/24/23  Yes Beasley, Caren D, MD  cyanocobalamin  (VITAMIN B12) 1000 MCG tablet Take 1 tablet (1,000 mcg total) by mouth daily. 09/24/23  Yes Beasley, Caren D, MD  Evolocumab  (REPATHA  SURECLICK) 140 MG/ML SOAJ Inject 140 mg into the skin every 14 (fourteen) days. 11/04/23  Yes [place], Peter M, MD  ezetimibe  (ZETIA ) 10 MG tablet TAKE 1 TABLET BY MOUTH EVERY DAY 05/05/23  Yes [place], Peter M, MD  gabapentin  (NEURONTIN ) 300 MG capsule Take 1 capsule (300 mg total) by mouth at bedtime. 06/20/23  Yes Purcell Emil Schanz, MD  glucose blood test strip Use as instructed 10/27/23  Yes Verdon Parry D, MD  Lancets MISC Used as directed 10/27/23  Yes Verdon Parry D, MD  metoprolol  tartrate (LOPRESSOR ) 25 MG tablet Take 1 tablet (25 mg total) by mouth 2 (two) times daily. 02/17/23  Yes Norleen Lynwood ORN, MD  nitroGLYCERIN  (NITROSTAT ) 0.4  MG SL tablet Place 1 tablet (0.4 mg total) under the tongue every 5 (five) minutes as needed for chest pain. 03/25/23  Yes [place], Peter M, MD  pantoprazole  (PROTONIX ) 40 MG tablet TAKE 1 TABLET BY MOUTH EVERY DAY 04/15/23  Yes Connelly [place] Jose, MD  ramipril  (ALTACE ) 10 MG capsule TAKE 1 CAPSULE BY MOUTH EVERY DAY 04/03/23  Yes [place], Peter M, MD  rosuvastatin  (CRESTOR ) 40 MG tablet TAKE 1 TABLET BY MOUTH EVERY DAY 05/05/23  Yes [place], Peter M, MD    Allergies  Allergen Reactions   Erythromycin Hives   Penicillins Hives    Has patient had a PCN reaction causing immediate rash, facial/tongue/throat swelling, SOB or lightheadedness with hypotension: No Has patient had a PCN reaction causing severe rash involving mucus membranes or skin necrosis: No Has patient had a PCN reaction that required hospitalization: No Has patient had a PCN reaction occurring within the last 10 years: No If all of the above answers are NO, then may proceed with Cephalosporin use.    Shellfish Allergy Hives   Doxycycline  Rash    Patient Active Problem List   Diagnosis Date Noted   BCC (basal cell carcinoma of skin) 08/18/2023   Sensorineural hearing loss, bilateral 08/01/2023   Pulmonary embolus (HCC) 05/27/2023   Adjustment reaction with anxiety and depression 10/16/2022   Mixed hyperlipidemia 09/17/2022   BMI  31.0-31.9,adult 03/26/2022   Obesity, Beginning BMI 30.66 03/26/2022   Insulin  resistance 02/26/2022   Chronic pain of right knee 10/15/2021   Pre-diabetes 07/11/2021   Degenerative disc disease, lumbar 05/19/2020   Restless leg syndrome 05/15/2020   Degenerative disc disease, cervical 02/02/2020   Polyarthralgia 01/03/2020   Patellofemoral arthritis of right knee 06/16/2019   Vitamin D  deficiency, Vit D = 43.2 (01/25/19), Rx vit D 50K IU every 2 weeks 08/04/2018   Cyst of right breast 05/07/2018   Chronic headache disorder 02/08/2018   Essential hypertension 11/17/2017   Class 1 obesity with  serious comorbidity and body mass index (BMI) of 30.0 to 30.9 in adult 07/15/2017   Insomnia 07/15/2017   Female stress incontinence 07/15/2017   Anxiety 07/15/2017   Gastroesophageal reflux disease with esophagitis, Rx Protonix  02/25/2017   Osteopenia 06/27/2015   Osteoarthritis of right hip 07/08/2014   Carotid artery disease    Aortic valve sclerosis    Dyslipidemia    Coronary artery disease     Past Medical History:  Diagnosis Date   Allergy    Aortic valve sclerosis    echo 12/2008, soft systolic murmur   Arthritis    in right hip   Back pain    Basal cell carcinoma 05/31/2016   bcc left forehead  tx exc   BCC (basal cell carcinoma of skin) 08/18/2023   chest - medial (center) - Ex needed    Blood transfusion without reported diagnosis    Carotid artery disease    40-59% bilateral, doppler December 2010   Cataract    Constipation    Coronary artery disease    a. BMS-mid RCA 11/2007 b. stress echo 02/2009 subtle inferior HK, lateral ST depressions with stress c. follow-up cath 02/2009: RCA stent patent, severe but stable stenosis of distal posterior lateral branch RCA. LV normal, med tx unless more sx c. ETT Myoview  08/27/12: negative for ischemia; EF 64%   Decreased hearing    Depression    Mild, August, 2012   Dyslipidemia    Easy bruising    Ejection fraction    EF normal, catheterization, 2011 /  EF 55%, echo, 2010   Emphysema of lung (HCC)    Fluid overload    Mild, stable since hospitalization in the past   GERD (gastroesophageal reflux disease)    occarionally, takes Pepcid  over the counter   Heart murmur    has, occasional PVC's   History of right hip replacement    Hyperlipidemia    Hypertension    Joint pain    Myocardial infarction (HCC)    2009   Palpitations    PVC (premature ventricular contraction)    per prior Holter monitor   Right hip pain 12/2009   Shellfish allergy    Stress fracture    Left foot   Trouble in sleeping    Vitamin B12  deficiency    Vitamin D  deficiency     Past Surgical History:  Procedure Laterality Date   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/01/2005   BREAST BIOPSY Left    carcinoid tumor removal     colon-   CARDIAC CATHETERIZATION  1/211   Patent RCA stent, stenotic PLB supplying small distribution; medically managed   CATARACT EXTRACTION, BILATERAL     CORONARY ANGIOPLASTY WITH STENT PLACEMENT  11/2007   BMS-mid RCA   DIAGNOSTIC LAPAROSCOPY     1982 for endometriosis   DILATION AND CURETTAGE OF UTERUS  2005   JOINT REPLACEMENT  2016  SKIN CANCER EXCISION     TOTAL HIP ARTHROPLASTY Right 07/08/2014   Procedure: RIGHT TOTAL HIP ARTHROPLASTY ANTERIOR APPROACH;  Surgeon: Lonni CINDERELLA Poli, MD;  Location: [facility];  Service: Orthopedics;  Laterality: Right;   WISDOM TOOTH EXTRACTION      Social History   Socioeconomic History   Marital status: Divorced    Spouse name: Not on file   Number of children: 1   Years of education: 66   Highest education level: Master's degree (e.g., MA, MS, MEng, MEd, MSW, MBA)  Occupational History   Occupation: Retired - Publishing copy  Tobacco Use   Smoking status: Never   Smokeless tobacco: Never  Vaping Use   Vaping status: Never Used  Substance and Sexual Activity   Alcohol use: Yes    Comment: 1 glass of wine 3x/week   Drug use: No   Sexual activity: Not Currently    Partners: Male    Comment: divorced  Other Topics Concern   Not on file  Social History Narrative   Right handed   Soda- sometimes   Drinks herbal tea   Fun: Dance, garden,    Denies religious beliefs effecting health care.   Denies abuse and feels safe at home   Social Drivers of Health   Financial Resource Strain: Low Risk  (11/16/2023)   Overall Financial Resource Strain (CARDIA)    Difficulty of Paying Living Expenses: Not hard at all  Food Insecurity: No Food Insecurity (11/16/2023)   Hunger Vital Sign    Worried About Running Out of Food in the Last Year: Never true    Ran  Out of Food in the Last Year: Never true  Transportation Needs: No Transportation Needs (11/16/2023)   PRAPARE - Administrator, Civil Service (Medical): No    Lack of Transportation (Non-Medical): No  Physical Activity: Insufficiently Active (11/16/2023)   Exercise Vital Sign    Days of Exercise per Week: 1 day    Minutes of Exercise per Session: 30 min  Stress: Stress Concern Present (11/16/2023)   [employer] of Occupational Health - Occupational Stress Questionnaire    Feeling of Stress: To some extent  Social Connections: Unknown (11/16/2023)   Social Connection and Isolation Panel    Frequency of Communication with Friends and Family: More than three times a week    Frequency of Social Gatherings with Friends and Family: Once a week    Attends Religious Services: More than 4 times per year    Active Member of [employer] or Organizations: Not on file    Attends Banker Meetings: Not on file    Marital Status: Divorced  Intimate Partner Violence: Not At Risk (01/27/2023)   Humiliation, Afraid, Rape, and Kick questionnaire    Fear of Current or Ex-Partner: No    Emotionally Abused: No    Physically Abused: No    Sexually Abused: No    Family History  Problem Relation Age of Onset   Stroke Mother    Hyperlipidemia Mother    Hypertension Mother    Heart disease Mother    Obesity Mother    Arthritis Mother    COPD Mother    Varicose Veins Mother    Heart attack Father 23   Hyperlipidemia Father    Hypertension Father    Sudden death Father    Heart disease Father    Heart disease Sister    COPD Sister    Hyperlipidemia Sister    Heart attack  Paternal Grandfather    Vision loss Maternal Aunt    BRCA 1/2 Neg Hx    Breast cancer Neg Hx    Esophageal cancer Neg Hx    Colon cancer Neg Hx    Pancreatic cancer Neg Hx    Liver disease Neg Hx    Stomach cancer Neg Hx    Colon polyps Neg Hx    Rectal cancer Neg Hx      Review of Systems   Constitutional: Negative.  Negative for chills and fever.  HENT: Negative.  Negative for congestion and sore throat.   Respiratory: Negative.  Negative for cough and shortness of breath.   Cardiovascular: Negative.  Negative for chest pain and palpitations.  Gastrointestinal:  Negative for abdominal pain, diarrhea, nausea and vomiting.  Genitourinary: Negative.  Negative for dysuria and hematuria.  Skin: Negative.  Negative for rash.  Neurological: Negative.  Negative for dizziness and headaches.  All other systems reviewed and are negative.   Vitals:   11/17/23 0914  BP: 130/80  Pulse: (!) 50  Temp: 97.6 F (36.4 C)  SpO2: 100%    Physical Exam Vitals reviewed.  Constitutional:      Appearance: Normal appearance.  HENT:     Head: Normocephalic.     Mouth/Throat:     Mouth: Mucous membranes are moist.     Pharynx: Oropharynx is clear.  Eyes:     Extraocular Movements: Extraocular movements intact.     Conjunctiva/sclera: Conjunctivae normal.  Cardiovascular:     Rate and Rhythm: Normal rate and regular rhythm.     Pulses: Normal pulses.     Heart sounds: Murmur heard.  Pulmonary:     Effort: Pulmonary effort is normal.     Breath sounds: Normal breath sounds.  Musculoskeletal:     Cervical back: No tenderness.  Lymphadenopathy:     Cervical: No cervical adenopathy.  Skin:    General: Skin is warm and dry.     Capillary Refill: Capillary refill takes less than 2 seconds.  Neurological:     Mental Status: She is alert and oriented to person, place, and time.  Psychiatric:        Mood and Affect: Mood normal.        Behavior: Behavior normal.      ASSESSMENT & PLAN: A total of 43 minutes was spent with the patient and counseling/coordination of care regarding preparing for this visit, review of most recent office visit notes, review of most recent cardiologist office visit notes, review of most recent echocardiogram and cardiac PET scan reports, review of  multiple chronic medical conditions and their management, review of all medications, review of most recent bloodwork results, review of health maintenance items, education on nutrition, prognosis, documentation, and need for follow up.  Problem List Items Addressed This Visit       Cardiovascular and Mediastinum   Aortic valve sclerosis   Stable without hemodynamic compromise. Asymptomatic      Coronary artery disease   Since her last visit patient was able to follow-up with cardiologist and had echocardiogram and nuclear medicine PET cardiac scan. As per cardiologist's report: This study demonstrates:  Echo shows normal LV function. Mild to moderate aortic stenosis. No significant change from 2022.  Medication changes / Follow up studies / Other recommendations:   None  This study demonstrates:  cardiac PET CT is normal. Normal perfusion and EF  Medication changes / Follow up studies / Other recommendations:   This is very  reassuring and suggests that she does not have significant blockage. Would continue to focus on risk factor modification. Let us  know if symptoms worsen.      Essential hypertension - Primary   BP Readings from Last 3 Encounters:  11/17/23 130/80  11/04/23 120/60  10/23/23 105/65  Well-controlled hypertension Continues amlodipine  5 mg daily, ramipril  10 mg daily and metoprolol  tartrate 25 mg twice a day         Other   Dyslipidemia   Chronic stable condition Continue Zetia  10 mg daily and rosuvastatin  40 mg daily Also on Repatha  Diet and nutrition discussed      Pre-diabetes   Lab Results  Component Value Date   HGBA1C 5.9 (H) 09/11/2023  Cardiovascular risks associated with diabetes discussed Diet and nutrition discussed.       Patient Instructions  Health Maintenance After Age 75 After age 71, you are at a higher risk for certain long-term diseases and infections as well as injuries from falls. Falls are a major cause of broken bones and  head injuries in people who are older than age 83. Getting regular preventive care can help to keep you healthy and well. Preventive care includes getting regular testing and making lifestyle changes as recommended by your health care provider. Talk with your health care provider about: Which screenings and tests you should have. A screening is a test that checks for a disease when you have no symptoms. A diet and exercise plan that is right for you. What should I know about screenings and tests to prevent falls? Screening and testing are the best ways to find a health problem early. Early diagnosis and treatment give you the best chance of managing medical conditions that are common after age 35. Certain conditions and lifestyle choices may make you more likely to have a fall. Your health care provider may recommend: Regular vision checks. Poor vision and conditions such as cataracts can make you more likely to have a fall. If you wear glasses, make sure to get your prescription updated if your vision changes. Medicine review. Work with your health care provider to regularly review all of the medicines you are taking, including over-the-counter medicines. Ask your health care provider about any side effects that may make you more likely to have a fall. Tell your health care provider if any medicines that you take make you feel dizzy or sleepy. Strength and balance checks. Your health care provider may recommend certain tests to check your strength and balance while standing, walking, or changing positions. Foot health exam. Foot pain and numbness, as well as not wearing proper footwear, can make you more likely to have a fall. Screenings, including: Osteoporosis screening. Osteoporosis is a condition that causes the bones to get weaker and break more easily. Blood pressure screening. Blood pressure changes and medicines to control blood pressure can make you feel dizzy. Depression screening. You may be  more likely to have a fall if you have a fear of falling, feel depressed, or feel unable to do activities that you used to do. Alcohol use screening. Using too much alcohol can affect your balance and may make you more likely to have a fall. Follow these instructions at home: Lifestyle Do not drink alcohol if: Your health care provider tells you not to drink. If you drink alcohol: Limit how much you have to: 0-1 drink a day for women. 0-2 drinks a day for men. Know how much alcohol is in your drink. In the  [place] one drink equals one 12 oz bottle of beer (355 mL), one 5 oz glass of wine (148 mL), or one 1 oz glass of hard liquor (44 mL). Do not use any products that contain nicotine or tobacco. These products include cigarettes, chewing tobacco, and vaping devices, such as e-cigarettes. If you need help quitting, ask your health care provider. Activity  Follow a regular exercise program to stay fit. This will help you maintain your balance. Ask your health care provider what types of exercise are appropriate for you. If you need a cane or walker, use it as recommended by your health care provider. Wear supportive shoes that have nonskid soles. Safety  Remove any tripping hazards, such as rugs, cords, and clutter. Install safety equipment such as grab bars in bathrooms and safety rails on stairs. Keep rooms and walkways well-lit. General instructions Talk with your health care provider about your risks for falling. Tell your health care provider if: You fall. Be sure to tell your health care provider about all falls, even ones that seem minor. You feel dizzy, tiredness (fatigue), or off-balance. Take over-the-counter and prescription medicines only as told by your health care provider. These include supplements. Eat a healthy diet and maintain a healthy weight. A healthy diet includes low-fat dairy products, low-fat (lean) meats, and fiber from whole grains, beans, and lots of fruits and  vegetables. Stay current with your vaccines. Schedule regular health, dental, and eye exams. Summary Having a healthy lifestyle and getting preventive care can help to protect your health and wellness after age 46. Screening and testing are the best way to find a health problem early and help you avoid having a fall. Early diagnosis and treatment give you the best chance for managing medical conditions that are more common for people who are older than age 32. Falls are a major cause of broken bones and head injuries in people who are older than age 61. Take precautions to prevent a fall at home. Work with your health care provider to learn what changes you can make to improve your health and wellness and to prevent falls. This information is not intended to replace advice given to you by your health care provider. Make sure you discuss any questions you have with your health care provider. Document Revised: 06/26/2020 Document Reviewed: 06/26/2020 Elsevier Patient Education  2024 Elsevier Inc.       Emil Schaumann, MD [facility]

## 2023-11-17 NOTE — Assessment & Plan Note (Signed)
 BP Readings from Last 3 Encounters:  11/17/23 130/80  11/04/23 120/60  10/23/23 105/65  Well-controlled hypertension Continues amlodipine  5 mg daily, ramipril  10 mg daily and metoprolol  tartrate 25 mg twice a day

## 2023-11-17 NOTE — Assessment & Plan Note (Signed)
 Stable without hemodynamic compromise. Asymptomatic

## 2023-11-17 NOTE — Assessment & Plan Note (Signed)
 Lab Results  Component Value Date   HGBA1C 5.9 (H) 09/11/2023  Cardiovascular risks associated with diabetes discussed Diet and nutrition discussed.

## 2023-11-17 NOTE — Patient Instructions (Signed)
 Health Maintenance After Age 75 After age 27, you are at a higher risk for certain long-term diseases and infections as well as injuries from falls. Falls are a major cause of broken bones and head injuries in people who are older than age 73. Getting regular preventive care can help to keep you healthy and well. Preventive care includes getting regular testing and making lifestyle changes as recommended by your health care provider. Talk with your health care provider about: Which screenings and tests you should have. A screening is a test that checks for a disease when you have no symptoms. A diet and exercise plan that is right for you. What should I know about screenings and tests to prevent falls? Screening and testing are the best ways to find a health problem early. Early diagnosis and treatment give you the best chance of managing medical conditions that are common after age 90. Certain conditions and lifestyle choices may make you more likely to have a fall. Your health care provider may recommend: Regular vision checks. Poor vision and conditions such as cataracts can make you more likely to have a fall. If you wear glasses, make sure to get your prescription updated if your vision changes. Medicine review. Work with your health care provider to regularly review all of the medicines you are taking, including over-the-counter medicines. Ask your health care provider about any side effects that may make you more likely to have a fall. Tell your health care provider if any medicines that you take make you feel dizzy or sleepy. Strength and balance checks. Your health care provider may recommend certain tests to check your strength and balance while standing, walking, or changing positions. Foot health exam. Foot pain and numbness, as well as not wearing proper footwear, can make you more likely to have a fall. Screenings, including: Osteoporosis screening. Osteoporosis is a condition that causes  the bones to get weaker and break more easily. Blood pressure screening. Blood pressure changes and medicines to control blood pressure can make you feel dizzy. Depression screening. You may be more likely to have a fall if you have a fear of falling, feel depressed, or feel unable to do activities that you used to do. Alcohol  use screening. Using too much alcohol  can affect your balance and may make you more likely to have a fall. Follow these instructions at home: Lifestyle Do not drink alcohol  if: Your health care provider tells you not to drink. If you drink alcohol : Limit how much you have to: 0-1 drink a day for women. 0-2 drinks a day for men. Know how much alcohol  is in your drink. In the [place] one drink equals one 12 oz bottle of beer (355 mL), one 5 oz glass of wine (148 mL), or one 1 oz glass of hard liquor (44 mL). Do not use any products that contain nicotine or tobacco. These products include cigarettes, chewing tobacco, and vaping devices, such as e-cigarettes. If you need help quitting, ask your health care provider. Activity  Follow a regular exercise program to stay fit. This will help you maintain your balance. Ask your health care provider what types of exercise are appropriate for you. If you need a cane or walker, use it as recommended by your health care provider. Wear supportive shoes that have nonskid soles. Safety  Remove any tripping hazards, such as rugs, cords, and clutter. Install safety equipment such as grab bars in bathrooms and safety rails on stairs. Keep rooms and walkways  well-lit. General instructions Talk with your health care provider about your risks for falling. Tell your health care provider if: You fall. Be sure to tell your health care provider about all falls, even ones that seem minor. You feel dizzy, tiredness (fatigue), or off-balance. Take over-the-counter and prescription medicines only as told by your health care provider. These include  supplements. Eat a healthy diet and maintain a healthy weight. A healthy diet includes low-fat dairy products, low-fat (lean) meats, and fiber from whole grains, beans, and lots of fruits and vegetables. Stay current with your vaccines. Schedule regular health, dental, and eye exams. Summary Having a healthy lifestyle and getting preventive care can help to protect your health and wellness after age 15. Screening and testing are the best way to find a health problem early and help you avoid having a fall. Early diagnosis and treatment give you the best chance for managing medical conditions that are more common for people who are older than age 42. Falls are a major cause of broken bones and head injuries in people who are older than age 64. Take precautions to prevent a fall at home. Work with your health care provider to learn what changes you can make to improve your health and wellness and to prevent falls. This information is not intended to replace advice given to you by your health care provider. Make sure you discuss any questions you have with your health care provider. Document Revised: 06/26/2020 Document Reviewed: 06/26/2020 Elsevier Patient Education  2024 [employer].

## 2023-11-17 NOTE — Assessment & Plan Note (Signed)
 Since her last visit patient was able to follow-up with cardiologist and had echocardiogram and nuclear medicine PET cardiac scan. As per cardiologist's report: This study demonstrates:  Echo shows normal LV function. Mild to moderate aortic stenosis. No significant change from 2022.  Medication changes / Follow up studies / Other recommendations:   None  This study demonstrates:  cardiac PET CT is normal. Normal perfusion and EF  Medication changes / Follow up studies / Other recommendations:   This is very reassuring and suggests that she does not have significant blockage. Would continue to focus on risk factor modification. Let us  know if symptoms worsen.

## 2023-11-20 ENCOUNTER — Ambulatory Visit: Admitting: Emergency Medicine

## 2023-11-25 ENCOUNTER — Other Ambulatory Visit (INDEPENDENT_AMBULATORY_CARE_PROVIDER_SITE_OTHER): Payer: Self-pay | Admitting: Family Medicine

## 2023-11-25 ENCOUNTER — Encounter: Payer: Self-pay | Admitting: Emergency Medicine

## 2023-11-25 DIAGNOSIS — R7303 Prediabetes: Secondary | ICD-10-CM

## 2023-11-25 NOTE — Telephone Encounter (Signed)
 Okay to refill.  Thanks.

## 2023-11-26 ENCOUNTER — Other Ambulatory Visit: Payer: Self-pay | Admitting: Radiology

## 2023-11-26 DIAGNOSIS — I10 Essential (primary) hypertension: Secondary | ICD-10-CM

## 2023-11-26 MED ORDER — AMLODIPINE BESYLATE 5 MG PO TABS: 5.0000 mg | ORAL_TABLET | Freq: Every day | ORAL | 3 refills | Status: AC

## 2023-11-27 ENCOUNTER — Ambulatory Visit (INDEPENDENT_AMBULATORY_CARE_PROVIDER_SITE_OTHER): Admitting: Family Medicine

## 2023-12-17 ENCOUNTER — Ambulatory Visit: Admitting: Dermatology

## 2023-12-17 ENCOUNTER — Encounter: Payer: Self-pay | Admitting: Dermatology

## 2023-12-17 VITALS — BP 122/63 | HR 58 | Temp 97.2°F

## 2023-12-17 DIAGNOSIS — Z85828 Personal history of other malignant neoplasm of skin: Secondary | ICD-10-CM

## 2023-12-17 DIAGNOSIS — L910 Hypertrophic scar: Secondary | ICD-10-CM | POA: Diagnosis not present

## 2023-12-17 DIAGNOSIS — C4491 Basal cell carcinoma of skin, unspecified: Secondary | ICD-10-CM

## 2023-12-17 MED ORDER — TRIAMCINOLONE ACETONIDE 10 MG/ML IJ SUSP
5.0000 mg | Freq: Once | INTRAMUSCULAR | Status: AC
  Administered 2023-12-17: 5 mg

## 2023-12-17 NOTE — Progress Notes (Signed)
   Follow Up Visit   Subjective  Courtney [name] is a 73 y.o. female who presents for the following: follow up from excision surgery   The patient presents for follow up from excision  surgery for a BCC on the chest- medial (center), treated on 10/02/2023, repaired with a complex linear closure. The patient has been bandaging the wound as directed. The endorse the following concerns:  Patient states over all she is happy but she would like the scar to be flatter.  The following portions of the chart were reviewed this encounter and updated as appropriate: medications, allergies, medical history  Review of Systems:  No other skin or systemic complaints except as noted in HPI or Assessment and Plan.  Objective  Well appearing patient in no apparent distress; mood and affect are within normal limits.  A focal examination was performed including the chest. All findings within normal limits unless otherwise noted below.  Healing wound with mild erythema  Relevant physical exam findings are noted in the Assessment and Plan.     Assessment & Plan  Kenalog 10 injection diluted with saline .15 Lot [id number] Exp 02/26 Ndc [id number]  Hypertrophic Scar s/p Mohs for a BCC on the chest- medial [facility]), treated on 10/02/2023, repaired with a complex linear closure - Reassured that wound is healing well - No evidence of infection - No swelling, induration, purulence, dehiscence, or tenderness out of proportion to the clinical exam, see photo above - Discussed that scars take up to 12 months to mature from the date of surgery - Recommend SPF 30+ to scar daily to prevent purple color from UV exposure during scar maturation process - Discussed that erythema and raised appearance of scar will fade over the next 4-6 months - OK to start scar massage at 4-6 weeks post-op - Can consider silicone based products for scar healing starting at 6 weeks post-op - ILK 5 injected today  HISTORY OF BASAL CELL  CARCINOMA OF THE SKIN - No evidence of recurrence today - Recommend regular full body skin exams - Recommend daily broad spectrum sunscreen SPF 30+ to sun-exposed areas, reapply every 2 hours as needed.  - Call if any new or changing lesions are noted between office visits  Return in 1 month (on 01/17/2024), or if symptoms worsen or fail to improve, for Follow Up.  I, Rollene Gobble, RN, am acting as scribe for RUFUS CHRISTELLA HOLY, MD .   Documentation: I have reviewed the above documentation for accuracy and completeness, and I agree with the above.  RUFUS CHRISTELLA HOLY, MD

## 2023-12-17 NOTE — Patient Instructions (Signed)

## 2023-12-22 ENCOUNTER — Ambulatory Visit: Attending: Cardiovascular Disease | Admitting: Cardiology

## 2023-12-22 DIAGNOSIS — Z01810 Encounter for preprocedural cardiovascular examination: Secondary | ICD-10-CM

## 2023-12-22 DIAGNOSIS — Z01818 Encounter for other preprocedural examination: Secondary | ICD-10-CM

## 2023-12-22 NOTE — Telephone Encounter (Signed)
 Cardiac clearance received. Patient is cleared for 01/01/24 procedure per Delon Hoover, NP, 12/22/23 note.

## 2023-12-22 NOTE — Progress Notes (Signed)
 Virtual Visit via Telephone Note   Because of Courtney [name] co-morbid illnesses, she is at least at moderate risk for complications without adequate follow up.  This format is felt to be most appropriate for this patient at this time.  Due to technical limitations with video connection (technology), today's appointment will be conducted as an audio only telehealth visit, and [name] verbally agreed to proceed in this manner.   All issues noted in this document were discussed and addressed.  No physical exam could be performed with this format.  Evaluation Performed:  Preoperative cardiovascular risk assessment _____________   Date:  12/22/2023   Patient ID:  Courtney [name], DOB Sep 13, 1950, MRN [id number] Patient Location:  Home Provider location:   Office  Primary Care Provider:  Purcell Emil Schanz, MD Primary Cardiologist:  Peter Jordan, MD  Chief Complaint / Patient Profile   73 y.o. y/o female with a h/o history of CAD s/p BMS to RCA in 2009 >> 06/08/2023 cardiac PET negative for ischemia, carotid artery disease, dyslipidemia, hypertension, PVCs, PE in April 2025, who is pending endoscopy/colonoscopy and presents today for telephonic preoperative cardiovascular risk assessment.  History of Present Illness    Courtney [name] is a 73 y.o. female who presents via audio/video conferencing for a telehealth visit today.  Pt was last seen in cardiology clinic on 03/25/2023 by Dr. Peter Jordan, dealing with a high level of personal stress and BP was elevated with some symptoms concerning for angina >> cardiac PET arranged revealing no ischemia. The patient is now pending procedure as outlined above. Since her last visit, she has been doing well, continues to garden and has been renovating her home. Blood pressure has been well controlled, She denies chest pain, palpitations, dyspnea, pnd, orthopnea, n, v, dizziness, syncope, edema, weight gain, or early satiety.   Past Medical History     Past Medical History:  Diagnosis Date   Allergy    Aortic valve sclerosis    echo 12/2008, soft systolic murmur   Arthritis    in right hip   Back pain    Basal cell carcinoma 05/31/2016   bcc left forehead  tx exc   BCC (basal cell carcinoma of skin) 08/18/2023   chest - medial (center) - Ex needed    Blood transfusion without reported diagnosis    Carotid artery disease    40-59% bilateral, doppler December 2010   Cataract    Constipation    Coronary artery disease    a. BMS-mid RCA 11/2007 b. stress echo 02/2009 subtle inferior HK, lateral ST depressions with stress c. follow-up cath 02/2009: RCA stent patent, severe but stable stenosis of distal posterior lateral branch RCA. LV normal, med tx unless more sx c. ETT Myoview  08/27/12: negative for ischemia; EF 64%   Decreased hearing    Depression    Mild, August, 2012   Dyslipidemia    Easy bruising    Ejection fraction    EF normal, catheterization, 2011 /  EF 55%, echo, 2010   Emphysema of lung (HCC)    Fluid overload    Mild, stable since hospitalization in the past   GERD (gastroesophageal reflux disease)    occarionally, takes Pepcid  over the counter   Heart murmur    has, occasional PVC's   History of right hip replacement    Hyperlipidemia    Hypertension    Joint pain    Myocardial infarction [facility])    2009   Palpitations  PVC (premature ventricular contraction)    per prior Holter monitor   Right hip pain 12/2009   Shellfish allergy    Stress fracture    Left foot   Trouble in sleeping    Vitamin B12 deficiency    Vitamin D  deficiency    Past Surgical History:  Procedure Laterality Date   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/01/2005   BREAST BIOPSY Left    carcinoid tumor removal     colon-   CARDIAC CATHETERIZATION  1/211   Patent RCA stent, stenotic PLB supplying small distribution; medically managed   CATARACT EXTRACTION, BILATERAL     CORONARY ANGIOPLASTY WITH STENT PLACEMENT  11/2007   BMS-mid RCA    DIAGNOSTIC LAPAROSCOPY     1982 for endometriosis   DILATION AND CURETTAGE OF UTERUS  2005   JOINT REPLACEMENT  2016   SKIN CANCER EXCISION     TOTAL HIP ARTHROPLASTY Right 07/08/2014   Procedure: RIGHT TOTAL HIP ARTHROPLASTY ANTERIOR APPROACH;  Surgeon: Lonni CINDERELLA Poli, MD;  Location: [facility];  Service: Orthopedics;  Laterality: Right;   WISDOM TOOTH EXTRACTION      Allergies  Allergies  Allergen Reactions   Erythromycin Hives   Penicillins Hives    Has patient had a PCN reaction causing immediate rash, facial/tongue/throat swelling, SOB or lightheadedness with hypotension: No Has patient had a PCN reaction causing severe rash involving mucus membranes or skin necrosis: No Has patient had a PCN reaction that required hospitalization: No Has patient had a PCN reaction occurring within the last 10 years: No If all of the above answers are NO, then may proceed with Cephalosporin use.    Shellfish Allergy Hives   Doxycycline  Rash    Home Medications    Prior to Admission medications   Medication Sig Start Date End Date Taking? Authorizing Provider  amLODipine  (NORVASC ) 5 MG tablet Take 1 tablet (5 mg total) by mouth daily. 11/26/23 11/25/24  Purcell Emil Schanz, MD  aspirin  81 MG tablet Take 81 mg by mouth daily.    [provider]  Blood Glucose Monitoring Suppl KIT Check BS 1-2 hours after starting lunch 10/23/23   Verdon Parry D, MD  Cholecalciferol (VITAMIN D3) 25 MCG (1000 UT) CAPS Take 1 capsule (1,000 Units total) by mouth daily. 09/24/23   Verdon Parry D, MD  cyanocobalamin  (VITAMIN B12) 1000 MCG tablet Take 1 tablet (1,000 mcg total) by mouth daily. 09/24/23   Verdon Parry D, MD  Evolocumab  (REPATHA  SURECLICK) 140 MG/ML SOAJ Inject 140 mg into the skin every 14 (fourteen) days. 11/04/23   Jordan, Peter M, MD  ezetimibe  (ZETIA ) 10 MG tablet TAKE 1 TABLET BY MOUTH EVERY DAY 05/05/23   Jordan, Peter M, MD  gabapentin  (NEURONTIN ) 300 MG capsule Take 1 capsule  (300 mg total) by mouth at bedtime. 06/20/23   Purcell Emil Schanz, MD  glucose blood test strip Use as instructed 10/27/23   Verdon Parry D, MD  Lancets MISC Used as directed 10/27/23   Verdon Parry D, MD  metoprolol  tartrate (LOPRESSOR ) 25 MG tablet Take 1 tablet (25 mg total) by mouth 2 (two) times daily. 02/17/23   Norleen Lynwood ORN, MD  nitroGLYCERIN  (NITROSTAT ) 0.4 MG SL tablet Place 1 tablet (0.4 mg total) under the tongue every 5 (five) minutes as needed for chest pain. 03/25/23   Jordan, Peter M, MD  pantoprazole  (PROTONIX ) 40 MG tablet TAKE 1 TABLET BY MOUTH EVERY DAY 04/15/23   Purcell Emil Schanz, MD  ramipril  (ALTACE ) 10 MG  capsule TAKE 1 CAPSULE BY MOUTH EVERY DAY 04/03/23   Jordan, Peter M, MD  rosuvastatin  (CRESTOR ) 40 MG tablet TAKE 1 TABLET BY MOUTH EVERY DAY 05/05/23   Jordan, Peter M, MD    Physical Exam    Vital Signs:  SARAI JANUARY does not have vital signs available for review today.  Given telephonic nature of communication, physical exam is limited. AAOx3. NAD. Normal affect.  Speech and respirations are unlabored.  Accessory Clinical Findings    None  Assessment & Plan    1.  Preoperative Cardiovascular Risk Assessment: According to the Revised Cardiac Risk Index (RCRI), her Perioperative Risk of Major Cardiac Event is (%): 0.4 Her Functional Capacity in METs is: 5.07 according to the [place] Activity Status Index (DASI). Therefore, based on ACC/AHA guidelines, patient would be at acceptable risk for the planned procedure without further cardiovascular testing. I will route this recommendation to the requesting party via Epic fax function.   The patient was advised that if she develops new symptoms prior to surgery to contact our office to arrange for a follow-up visit, and she verbalized understanding.   A copy of this note will be routed to requesting surgeon.  Time:   Today, I have spent 10 minutes with the patient with telehealth technology discussing medical  history, symptoms, and management plan.     Delon JAYSON Hoover, NP  12/22/2023, 9:48 AM

## 2023-12-25 ENCOUNTER — Encounter (INDEPENDENT_AMBULATORY_CARE_PROVIDER_SITE_OTHER): Payer: Self-pay | Admitting: Family Medicine

## 2023-12-25 ENCOUNTER — Ambulatory Visit (INDEPENDENT_AMBULATORY_CARE_PROVIDER_SITE_OTHER): Payer: Self-pay | Admitting: Family Medicine

## 2023-12-25 ENCOUNTER — Encounter: Payer: Self-pay | Admitting: Internal Medicine

## 2023-12-25 VITALS — BP 134/56 | HR 51 | Temp 97.8°F | Ht 60.0 in | Wt 160.0 lb

## 2023-12-25 DIAGNOSIS — E669 Obesity, unspecified: Secondary | ICD-10-CM | POA: Diagnosis not present

## 2023-12-25 DIAGNOSIS — Z6831 Body mass index (BMI) 31.0-31.9, adult: Secondary | ICD-10-CM | POA: Diagnosis not present

## 2023-12-25 DIAGNOSIS — I10 Essential (primary) hypertension: Secondary | ICD-10-CM | POA: Diagnosis not present

## 2023-12-25 DIAGNOSIS — Z638 Other specified problems related to primary support group: Secondary | ICD-10-CM

## 2023-12-25 NOTE — Progress Notes (Signed)
 Office: [phone]  /  Fax: [phone]  WEIGHT SUMMARY AND BIOMETRICS  Anthropometric Measurements Height: 5' (1.524 m) Weight: 160 lb (72.6 kg) BMI (Calculated): 31.25 Weight at Last Visit: 161 lb Weight Lost Since Last Visit: 1 lb Weight Gained Since Last Visit: 0 Starting Weight: 157 lb Total Weight Loss (lbs): 0 lb (0 kg) Peak Weight: 162 lb   Body Composition  Body Fat %: 42.9 % Fat Mass (lbs): 68.6 lbs Muscle Mass (lbs): 86.8 lbs Total Body Water (lbs): 59.4 lbs Visceral Fat Rating : 13   Other Clinical Data Fasting: yes Labs: no Today's Visit #: 75 Starting Date: 06/16/17    Chief Complaint: OBESITY   History of Present Illness Courtney [name] is a 73 year old female with obesity and hypertension who presents for obesity treatment and progress assessment.  She adheres to the category one eating plan approximately 75% of the time but struggles with protein intake. She is working on increasing her consumption of fruits and vegetables and drinking more water. She does not skip meals but finds it challenging to achieve seven to nine hours of sleep per night and is not currently engaging in exercise. Over the past two months, she has lost one pound since her last visit.  She manages hypertension with amlodipine , ramipril , and metoprolol . Her blood pressure was initially elevated at 150/70 mmHg during today's visit, but a recheck showed improvement to 134/56 mmHg.  Her sleep has improved slightly as she is 'letting some things go' and listens to podcasts to help her fall asleep. She does not feel particularly hungry and sometimes skips breakfast, opting for a cup of yogurt later. She is preparing for a colonoscopy and anticipates dietary challenges associated with it.  She is not napping as much as before and has been reading more, including several hardback books from [employer]. She struggles with protein intake and often wakes up at night, using a piece of toast  with peanut butter and a cup of tea to help her return to sleep. She does not feel hungry upon waking but describes it as a habit.      PHYSICAL EXAM:  Blood pressure (!) 134/56, pulse (!) 51, temperature 97.8 F (36.6 C), height 5' (1.524 m), weight 160 lb (72.6 kg), SpO2 97%. Body mass index is 31.25 kg/m.  DIAGNOSTIC DATA REVIEWED:  BMET    Component Value Date/Time   NA 141 09/11/2023 0934   K 4.8 09/11/2023 0934   CL 106 09/11/2023 0934   CO2 19 (L) 09/11/2023 0934   GLUCOSE 101 (H) 09/11/2023 0934   GLUCOSE 244 (H) 05/27/2023 2310   BUN 22 09/11/2023 0934   CREATININE 0.86 09/11/2023 0934   CALCIUM  9.7 09/11/2023 0934   GFRNONAA >60 05/27/2023 2310   GFRAA 63 03/30/2020 1030   Lab Results  Component Value Date   HGBA1C 5.9 (H) 09/11/2023   HGBA1C 5.6 06/16/2017   Lab Results  Component Value Date   INSULIN  10.5 09/11/2023   INSULIN  6.4 06/16/2017   Lab Results  Component Value Date   TSH 1.030 09/11/2023   CBC    Component Value Date/Time   WBC 6.7 09/11/2023 0934   WBC 8.4 05/27/2023 2310   RBC 3.97 09/11/2023 0934   RBC 3.93 05/27/2023 2310   HGB 12.4 09/11/2023 0934   HCT 37.7 09/11/2023 0934   PLT 233 09/11/2023 0934   MCV 95 09/11/2023 0934   MCH 31.2 09/11/2023 0934   MCH 31.6 05/27/2023 2310  MCHC 32.9 09/11/2023 0934   MCHC 33.5 05/27/2023 2310   RDW 12.0 09/11/2023 0934   Iron Studies    Component Value Date/Time   IRON 117 10/17/2020 1013   IRON 100 01/25/2019 0853   TIBC 431.2 10/17/2020 1013   TIBC 328 01/25/2019 0853   FERRITIN 133.2 10/17/2020 1013   FERRITIN 54 01/25/2019 0853   IRONPCTSAT 27.1 10/17/2020 1013   IRONPCTSAT 30 01/25/2019 0853   Lipid Panel     Component Value Date/Time   CHOL 128 09/11/2023 0934   TRIG 79 09/11/2023 0934   HDL 70 09/11/2023 0934   CHOLHDL 1.7 03/25/2023 1100   CHOLHDL 3 11/30/2015 1028   VLDL 25.4 11/30/2015 1028   LDLCALC 43 09/11/2023 0934   Hepatic Function Panel     Component  Value Date/Time   PROT 6.4 09/11/2023 0934   ALBUMIN  4.2 09/11/2023 0934   AST 18 09/11/2023 0934   ALT 14 09/11/2023 0934   ALKPHOS 78 09/11/2023 0934   BILITOT 0.2 09/11/2023 0934   BILIDIR 0.13 05/15/2021 1000      Component Value Date/Time   TSH 1.030 09/11/2023 0934   Nutritional Lab Results  Component Value Date   VD25OH 42.4 09/11/2023   VD25OH 90.1 10/15/2022   VD25OH 83.1 02/26/2022     Assessment and Plan Assessment & Plan Obesity Management is ongoing with a focus on dietary changes. She adheres to the category one eating plan 75% of the time but struggles with protein intake. She is increasing fruits, vegetables, and water consumption. She has lost one pound in the last two months. She is not exercising and does not achieve the recommended sleep duration. She is preparing for a colonoscopy, which may impact her dietary habits. - Encouraged adherence to the category one eating plan. - Recommended increasing protein intake using the protein pudding recipe. - Advised on maintaining hydration and increasing fruit and vegetable intake. - Discussed the importance of routine and flexibility during the holiday season.  Essential hypertension Hypertension is managed with amlodipine , ramipril , and metoprolol . Blood pressure was initially elevated at 150/70 but improved to 134/56 upon recheck. She did not take her morning medication due to a disrupted routine. She is working on diet, exercise, and weight loss to improve blood pressure. - Continue current antihypertensive medications: amlodipine , ramipril , and metoprolol . - Advised on lifestyle modifications including diet, exercise, and weight loss.     I personally spent a total of 32 minutes in the care of the patient today including preparing to see the patient, performing a medically appropriate evaluation of current problems, documenting clinical information in the EMR, and customized nutritional counseling for their  specific health and social needs.  Courtney [name] was counseled on the importance of maintaining healthy lifestyle habits, including balanced nutrition, regular physical activity, and behavioral modifications, while taking antiobesity medication.  Patient verbalized understanding that medication is an adjunct to, not a replacement for, lifestyle changes and that the long-term success and weight maintenance depend on continued adherence to these strategies.   Courtney [name] was informed of the importance of frequent follow up visits to maximize her success with intensive lifestyle modifications for her obesity and obesity related health conditions as recommended by USPSTF and CMS guidelines   Louann Penton, MD

## 2023-12-26 ENCOUNTER — Encounter: Admitting: Gastroenterology

## 2023-12-31 NOTE — Progress Notes (Signed)
 [facility] History and Physical   Primary Care Physician:  Purcell Emil Schanz, MD   Reason for Procedure:    Encounter Diagnosis  Name Primary?   Colon cancer screening Yes     Plan:    Colonoscopy     HPI: Courtney [name] is a 73 y.o. female presenting for screening colonoscopy, last exam 2015 without neoplasia.   Past Medical History:  Diagnosis Date   Allergy    Aortic valve sclerosis    echo 12/2008, soft systolic murmur   Arthritis    in right hip   Back pain    Basal cell carcinoma 05/31/2016   bcc left forehead  tx exc   BCC (basal cell carcinoma of skin) 08/18/2023   chest - medial (center) - Ex needed    Blood transfusion without reported diagnosis    Carotid artery disease    40-59% bilateral, doppler December 2010   Cataract    Constipation    Coronary artery disease    a. BMS-mid RCA 11/2007 b. stress echo 02/2009 subtle inferior HK, lateral ST depressions with stress c. follow-up cath 02/2009: RCA stent patent, severe but stable stenosis of distal posterior lateral branch RCA. LV normal, med tx unless more sx c. ETT Myoview  08/27/12: negative for ischemia; EF 64%   Decreased hearing    Depression    Mild, August, 2012   Dyslipidemia    Easy bruising    Ejection fraction    EF normal, catheterization, 2011 /  EF 55%, echo, 2010   Emphysema of lung (HCC)    Fluid overload    Mild, stable since hospitalization in the past   GERD (gastroesophageal reflux disease)    occarionally, takes Pepcid  over the counter   Heart murmur    has, occasional PVC's   History of right hip replacement    Hyperlipidemia    Hypertension    Joint pain    Myocardial infarction (HCC)    2009   Palpitations    PVC (premature ventricular contraction)    per prior Holter monitor   Right hip pain 12/2009   Shellfish allergy    Stress fracture    Left foot   Trouble in sleeping    Vitamin B12 deficiency    Vitamin D  deficiency     Past Surgical  History:  Procedure Laterality Date   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/01/2005   BREAST BIOPSY Left    carcinoid tumor removal     colon-   CARDIAC CATHETERIZATION  1/211   Patent RCA stent, stenotic PLB supplying small distribution; medically managed   CATARACT EXTRACTION, BILATERAL     CORONARY ANGIOPLASTY WITH STENT PLACEMENT  11/2007   BMS-mid RCA   DIAGNOSTIC LAPAROSCOPY     1982 for endometriosis   DILATION AND CURETTAGE OF UTERUS  2005   JOINT REPLACEMENT  2016   SKIN CANCER EXCISION     TOTAL HIP ARTHROPLASTY Right 07/08/2014   Procedure: RIGHT TOTAL HIP ARTHROPLASTY ANTERIOR APPROACH;  Surgeon: Lonni CINDERELLA Poli, MD;  Location: [facility];  Service: Orthopedics;  Laterality: Right;   WISDOM TOOTH EXTRACTION       Current Outpatient Medications  Medication Sig Dispense Refill   amLODipine  (NORVASC ) 5 MG tablet Take 1 tablet (5 mg total) by mouth daily. 90 tablet 3   aspirin  81 MG tablet Take 81 mg by mouth daily.     Cholecalciferol (VITAMIN D3) 25 MCG (1000 UT) CAPS Take 1 capsule (1,000 Units total) by mouth  daily.     cyanocobalamin  (VITAMIN B12) 1000 MCG tablet Take 1 tablet (1,000 mcg total) by mouth daily.     ezetimibe  (ZETIA ) 10 MG tablet TAKE 1 TABLET BY MOUTH EVERY DAY 90 tablet 3   gabapentin  (NEURONTIN ) 300 MG capsule Take 1 capsule (300 mg total) by mouth at bedtime. 90 capsule 3   metoprolol  tartrate (LOPRESSOR ) 25 MG tablet Take 1 tablet (25 mg total) by mouth 2 (two) times daily. 180 tablet 3   pantoprazole  (PROTONIX ) 40 MG tablet TAKE 1 TABLET BY MOUTH EVERY DAY 90 tablet 3   ramipril  (ALTACE ) 10 MG capsule TAKE 1 CAPSULE BY MOUTH EVERY DAY 90 capsule 3   rosuvastatin  (CRESTOR ) 40 MG tablet TAKE 1 TABLET BY MOUTH EVERY DAY 90 tablet 3   Blood Glucose Monitoring Suppl KIT Check BS 1-2 hours after starting lunch 1 kit 0   Evolocumab  (REPATHA  SURECLICK) 140 MG/ML SOAJ Inject 140 mg into the skin every 14 (fourteen) days. 6 mL 3   glucose blood test strip Use as  instructed 100 each 12   Lancets MISC Used as directed 100 each 0   nitroGLYCERIN  (NITROSTAT ) 0.4 MG SL tablet Place 1 tablet (0.4 mg total) under the tongue every 5 (five) minutes as needed for chest pain. 25 tablet 11   Current Facility-Administered Medications  Medication Dose Route Frequency Provider Last Rate Last Admin   0.9 %  sodium chloride  infusion  500 mL Intravenous Continuous Avram Lupita BRAVO, MD        Allergies as of 01/01/2024 - Review Complete 01/01/2024  Allergen Reaction Noted   Erythromycin Hives    Penicillins Hives    Shellfish allergy Hives 11/10/2014   Doxycycline  Rash 10/16/2022    Family History  Problem Relation Age of Onset   Stroke Mother    Hyperlipidemia Mother    Hypertension Mother    Heart disease Mother    Obesity Mother    Arthritis Mother    COPD Mother    Varicose Veins Mother    Heart attack Father 46   Hyperlipidemia Father    Hypertension Father    Sudden death Father    Heart disease Father    Heart disease Sister    COPD Sister    Hyperlipidemia Sister    Heart attack Paternal Grandfather    Vision loss Maternal Aunt    BRCA 1/2 Neg Hx    Breast cancer Neg Hx    Esophageal cancer Neg Hx    Colon cancer Neg Hx    Pancreatic cancer Neg Hx    Liver disease Neg Hx    Stomach cancer Neg Hx    Colon polyps Neg Hx    Rectal cancer Neg Hx     Social History   Socioeconomic History   Marital status: Divorced    Spouse name: Not on file   Number of children: 1   Years of education: 18   Highest education level: Master's degree (e.g., MA, MS, MEng, MEd, MSW, MBA)  Occupational History   Occupation: Retired - Publishing Copy  Tobacco Use   Smoking status: Never   Smokeless tobacco: Never  Vaping Use   Vaping status: Never Used  Substance and Sexual Activity   Alcohol use: Yes    Comment: 1 glass of wine 3x/week   Drug use: No   Sexual activity: Not Currently    Partners: Male    Comment: divorced  Other Topics  Concern   Not on file  Social History Narrative   Right handed   Soda- sometimes   Drinks herbal tea   Fun: Dance, garden,    Denies religious beliefs effecting health care.   Denies abuse and feels safe at home   Social Drivers of Health   Financial Resource Strain: Low Risk  (11/16/2023)   Overall Financial Resource Strain (CARDIA)    Difficulty of Paying Living Expenses: Not hard at all  Food Insecurity: No Food Insecurity (11/16/2023)   Hunger Vital Sign    Worried About Running Out of Food in the Last Year: Never true    Ran Out of Food in the Last Year: Never true  Transportation Needs: No Transportation Needs (11/16/2023)   PRAPARE - Administrator, Civil Service (Medical): No    Lack of Transportation (Non-Medical): No  Physical Activity: Insufficiently Active (11/16/2023)   Exercise Vital Sign    Days of Exercise per Week: 1 day    Minutes of Exercise per Session: 30 min  Stress: Stress Concern Present (11/16/2023)   [employer] of Occupational Health - Occupational Stress Questionnaire    Feeling of Stress: To some extent  Social Connections: Unknown (11/16/2023)   Social Connection and Isolation Panel    Frequency of Communication with Friends and Family: More than three times a week    Frequency of Social Gatherings with Friends and Family: Once a week    Attends Religious Services: More than 4 times per year    Active Member of [employer] or Organizations: Not on file    Attends Banker Meetings: Not on file    Marital Status: Divorced  Intimate Partner Violence: Not At Risk (01/27/2023)   Humiliation, Afraid, Rape, and Kick questionnaire    Fear of Current or Ex-Partner: No    Emotionally Abused: No    Physically Abused: No    Sexually Abused: No    Review of Systems:  All other review of systems negative except as mentioned in the HPI.  Physical Exam: Vital signs BP (!) 114/42   Pulse (!) 46   Temp (!) 97.2 F (36.2 C)   Resp 18    Ht 5' (1.524 m)   Wt 166 lb (75.3 kg)   SpO2 100%   BMI 32.42 kg/m   General:   Alert,  Well-developed, well-nourished, pleasant and cooperative in NAD Lungs:  Clear throughout to auscultation.   Heart:  Regular rate and rhythm; 2/6 SEM RUSB Abdomen:  Soft, nontender and nondistended. Normal bowel sounds.   Neuro/Psych:  Alert and cooperative. Normal mood and affect. A and O x 3   @Chaquita Basques  CHARLENA Commander, MD, [facility] [phone] (pager) 01/01/2024 11:38 AM@

## 2024-01-01 ENCOUNTER — Ambulatory Visit (AMBULATORY_SURGERY_CENTER): Admitting: Internal Medicine

## 2024-01-01 ENCOUNTER — Encounter: Payer: Self-pay | Admitting: Internal Medicine

## 2024-01-01 VITALS — BP 111/45 | HR 66 | Temp 97.2°F | Resp 17 | Ht 60.0 in | Wt 166.0 lb

## 2024-01-01 DIAGNOSIS — I10 Essential (primary) hypertension: Secondary | ICD-10-CM | POA: Diagnosis not present

## 2024-01-01 DIAGNOSIS — I493 Ventricular premature depolarization: Secondary | ICD-10-CM | POA: Diagnosis not present

## 2024-01-01 DIAGNOSIS — J439 Emphysema, unspecified: Secondary | ICD-10-CM | POA: Diagnosis not present

## 2024-01-01 DIAGNOSIS — Z1211 Encounter for screening for malignant neoplasm of colon: Secondary | ICD-10-CM

## 2024-01-01 DIAGNOSIS — I2510 Atherosclerotic heart disease of native coronary artery without angina pectoris: Secondary | ICD-10-CM | POA: Diagnosis not present

## 2024-01-01 DIAGNOSIS — K5730 Diverticulosis of large intestine without perforation or abscess without bleeding: Secondary | ICD-10-CM

## 2024-01-01 DIAGNOSIS — F32A Depression, unspecified: Secondary | ICD-10-CM | POA: Diagnosis not present

## 2024-01-01 MED ORDER — SODIUM CHLORIDE 0.9 % IV SOLN: 500.0000 mL | INTRAVENOUS | Status: DC

## 2024-01-01 NOTE — Op Note (Signed)
 [facility] Patient Name: Courtney [name] Procedure Date: 01/01/2024 11:34 AM MRN: [id number] Endoscopist: Lupita FORBES Commander , MD, [id number] Age: 73 Referring MD:  Date of Birth: 05-May-1950 Gender: Female Account #: [id number] Procedure:                Colonoscopy Indications:              Screening for colorectal malignant neoplasm, Last                            colonoscopy: 2015 Medicines:                Monitored Anesthesia Care Procedure:                Pre-Anesthesia Assessment:                           - Prior to the procedure, a History and Physical                            was performed, and patient medications and                            allergies were reviewed. The patient's tolerance of                            previous anesthesia was also reviewed. The risks                            and benefits of the procedure and the sedation                            options and risks were discussed with the patient.                            All questions were answered, and informed consent                            was obtained. Prior Anticoagulants: The patient has                            taken no anticoagulant or antiplatelet agents. ASA                            Grade Assessment: III - A patient with severe                            systemic disease. After reviewing the risks and                            benefits, the patient was deemed in satisfactory                            condition to undergo the procedure.  After obtaining informed consent, the colonoscope                            was passed under direct vision. Throughout the                            procedure, the patient's blood pressure, pulse, and                            oxygen saturations were monitored continuously. The                            CF [id number] #[id number] was introduced through the anus                            and advanced to the the cecum,  identified by                            appendiceal orifice and ileocecal valve. The                            colonoscopy was performed without difficulty. The                            patient tolerated the procedure well. The quality                            of the bowel preparation was good. The ileocecal                            valve, appendiceal orifice, and rectum were                            photographed. Scope In: 11:46:54 AM Scope Out: 11:57:26 AM Scope Withdrawal Time: 0 hours 8 minutes 15 seconds  Total Procedure Duration: 0 hours 10 minutes 32 seconds  Findings:                 The perianal and digital rectal examinations were                            normal.                           Multiple diverticula were found in the sigmoid                            colon.                           The exam was otherwise without abnormality on                            direct and retroflexion views. Complications:            No immediate complications. Estimated Blood Loss:  Estimated blood loss: none. Impression:               - Diverticulosis in the sigmoid colon.                           - The examination was otherwise normal on direct                            and retroflexion views.                           - No specimens collected. Recommendation:           - Patient has a contact number available for                            emergencies. The signs and symptoms of potential                            delayed complications were discussed with the                            patient. Return to normal activities tomorrow.                            Written discharge instructions were provided to the                            patient.                           - Resume previous diet.                           - Continue present medications.                           - No repeat colonoscopy due to age and the absence                            of colonic  polyps. Lupita FORBES Commander, MD 01/01/2024 12:06:08 PM This report has been signed electronically.

## 2024-01-01 NOTE — Progress Notes (Signed)
 Report given to PACU, vss

## 2024-01-01 NOTE — Patient Instructions (Addendum)
 No polyps or cancer were seen.  You do have diverticulosis which are pockets or pouches in the colon found in most people.  Fortunately most people with this do not get the complication called diverticulitis.  Please read the handout about this.  There is no need for routine repeat colonoscopy or other testing for colon cancer screening.  If you have signs or symptoms of gastrointestinal problems a colonoscopy could be needed.  I hope that does not happen.  I appreciate the opportunity to care for you. Lupita CHARLENA Commander, MD, [facility]  Resume previous diet Continue present medications  Handouts/information given for diverticulosis  YOU HAD AN ENDOSCOPIC PROCEDURE TODAY AT THE [facility]:   Refer to the procedure report that was given to you for any specific questions about what was found during the examination.  If the procedure report does not answer your questions, please call your gastroenterologist to clarify.  If you requested that your care partner not be given the details of your procedure findings, then the procedure report has been included in a sealed envelope for you to review at your convenience later.  YOU SHOULD EXPECT: Some feelings of bloating in the abdomen. Passage of more gas than usual.  Walking can help get rid of the air that was put into your GI tract during the procedure and reduce the bloating. If you had a lower endoscopy (such as a colonoscopy or flexible sigmoidoscopy) you may notice spotting of blood in your stool or on the toilet paper. If you underwent a bowel prep for your procedure, you may not have a normal bowel movement for a few days.  Please Note:  You might notice some irritation and congestion in your nose or some drainage.  This is from the oxygen used during your procedure.  There is no need for concern and it should clear up in a day or so.  SYMPTOMS TO REPORT IMMEDIATELY:  Following lower endoscopy (colonoscopy):  Excessive amounts of blood in  the stool  Significant tenderness or worsening of abdominal pains  Swelling of the abdomen that is new, acute  Fever of 100F or higher For urgent or emergent issues, a gastroenterologist can be reached at any hour by calling [phone]. Do not use MyChart messaging for urgent concerns.   DIET:  We do recommend a small meal at first, but then you may proceed to your regular diet.  Drink plenty of fluids but you should avoid alcoholic beverages for 24 hours.  ACTIVITY:  You should plan to take it easy for the rest of today and you should NOT DRIVE or use heavy machinery until tomorrow (because of the sedation medicines used during the test).    FOLLOW UP: Our staff will call the number listed on your records the next business day following your procedure.  We will call around 7:15- 8:00 am to check on you and address any questions or concerns that you may have regarding the information given to you following your procedure. If we do not reach you, we will leave a message.     SIGNATURES/CONFIDENTIALITY: You and/or your care partner have signed paperwork which will be entered into your electronic medical record.  These signatures attest to the fact that that the information above on your After Visit Summary has been reviewed and is understood.  Full responsibility of the confidentiality of this discharge information lies with you and/or your care-partner.

## 2024-01-02 ENCOUNTER — Telehealth: Payer: Self-pay | Admitting: *Deleted

## 2024-01-02 NOTE — Telephone Encounter (Signed)
 Post procedure follow up call placed, no answer and left VM.

## 2024-01-20 ENCOUNTER — Ambulatory Visit: Admitting: Dermatology

## 2024-01-27 ENCOUNTER — Other Ambulatory Visit: Payer: Self-pay

## 2024-01-27 ENCOUNTER — Other Ambulatory Visit: Payer: Self-pay | Admitting: Internal Medicine

## 2024-01-27 ENCOUNTER — Other Ambulatory Visit: Payer: Self-pay | Admitting: Emergency Medicine

## 2024-01-28 ENCOUNTER — Ambulatory Visit

## 2024-01-28 VITALS — BP 140/78 | HR 60 | Ht 60.0 in | Wt 166.2 lb

## 2024-01-28 DIAGNOSIS — Z0000 Encounter for general adult medical examination without abnormal findings: Secondary | ICD-10-CM | POA: Diagnosis not present

## 2024-01-28 NOTE — Patient Instructions (Signed)
 [name],  Thank you for taking the time for your Medicare Wellness Visit. I appreciate your continued commitment to your health goals. Please review the care plan we discussed, and feel free to reach out if I can assist you further.  Please note that Annual Wellness Visits do not include a physical exam. Some assessments may be limited, especially if the visit was conducted virtually. If needed, we may recommend an in-person follow-up with your provider.  Ongoing Care Seeing your primary care provider every 3 to 6 months helps us  monitor your health and provide consistent, personalized care.   Referrals If a referral was made during today's visit and you haven't received any updates within two weeks, please contact the referred provider directly to check on the status.  Recommended Screenings:  Health Maintenance  Topic Date Due   Pap Smear  11/23/2019   COVID-19 Vaccine (4 - 2025-26 season) 10/20/2023   Breast Cancer Screening  07/07/2024   Medicare Annual Wellness Visit  01/27/2025   DTaP/Tdap/Td vaccine (3 - Td or Tdap) 11/28/2025   Pneumococcal Vaccine for age over 86  Completed   Flu Shot  Completed   Osteoporosis screening with Bone Density Scan  Completed   Hepatitis C Screening  Completed   Zoster (Shingles) Vaccine  Completed   Meningitis B Vaccine  Aged Out   Colon Cancer Screening  Discontinued       05/27/2023   11:03 PM  Advanced Directives  Does Patient Have a Medical Advance Directive? No    Vision: Annual vision screenings are recommended for early detection of glaucoma, cataracts, and diabetic retinopathy. These exams can also reveal signs of chronic conditions such as diabetes and high blood pressure.  Dental: Annual dental screenings help detect early signs of oral cancer, gum disease, and other conditions linked to overall health, including heart disease and diabetes.

## 2024-01-28 NOTE — Progress Notes (Signed)
 Chief Complaint  Patient presents with   Medicare Wellness     Subjective:   Courtney [name] is a 73 y.o. female who presents for a Medicare Annual Wellness Visit.  Visit info / Clinical Intake: Medicare Wellness Visit Type:: Subsequent Annual Wellness Visit Persons participating in visit and providing information:: patient Medicare Wellness Visit Mode:: In-person (required for WTM) Interpreter Needed?: No Pre-visit prep was completed: yes AWV questionnaire completed by patient prior to visit?: no Living arrangements:: (!) lives alone Patient's Overall Health Status Rating: good Typical amount of pain: none Does pain affect daily life?: no Are you currently prescribed opioids?: no  Dietary Habits and Nutritional Risks How many meals a day?: 3 Eats fruit and vegetables daily?: yes Most meals are obtained by: preparing own meals; eating out In the last 2 weeks, have you had any of the following?: none Diabetic:: no  Functional Status Activities of Daily Living (to include ambulation/medication): Independent Ambulation: Independent with device- listed below Home Assistive Devices/Equipment: Eyeglasses Medication Administration: Independent Home Management (perform basic housework or laundry): Independent Manage your own finances?: yes Primary transportation is: driving Concerns about vision?: no *vision screening is required for WTM* Concerns about hearing?: (!) yes Uses hearing aids?: (!) yes Hear whispered voice?: (!) no *in-person visit only*  Fall Screening Falls in the past year?: 1 Number of falls in past year: 0 (1) Was there an injury with Fall?: 0 Fall Risk Category Calculator: 1 Patient Fall Risk Level: Low Fall Risk  Fall Risk Patient at Risk for Falls Due to: Other (Comment) (slipped in yard) Fall risk Follow up: Falls evaluation completed; Falls prevention discussed  Home and Transportation Safety: All rugs have non-skid backing?: N/A, no rugs All  stairs or steps have railings?: yes (outside) Grab bars in the bathtub or shower?: yes Have non-skid surface in bathtub or shower?: yes Good home lighting?: yes Regular seat belt use?: yes Hospital stays in the last year:: no  Cognitive Assessment Difficulty concentrating, remembering, or making decisions? : no Will 6CIT or Mini Cog be Completed: yes What year is it?: 0 points What month is it?: 0 points Give patient an address phrase to remember (5 components): [address] [place], [place] About what time is it?: 0 points Count backwards from 20 to 1: 0 points Say the months of the year in reverse: 0 points Repeat the address phrase from earlier: 0 points 6 CIT Score: 0 points  Advance Directives (For Healthcare) Does Patient Have a Medical Advance Directive?: Yes Does patient want to make changes to medical advance directive?: Yes (Inpatient - patient requests chaplain consult to change a medical advance directive) Type of Advance Directive: Healthcare Power of [place]; Living will Copy of Healthcare Power of Attorney in Chart?: No - copy requested Copy of Living Will in Chart?: No - copy requested  Reviewed/Updated  Reviewed/Updated: Reviewed All (Medical, Surgical, Family, Medications, Allergies, Care Teams, Patient Goals)    Allergies (verified) Erythromycin, Penicillins, Shellfish allergy, and Doxycycline    Current Medications (verified) Outpatient Encounter Medications as of 01/28/2024  Medication Sig   amLODipine  (NORVASC ) 5 MG tablet Take 1 tablet (5 mg total) by mouth daily.   aspirin  81 MG tablet Take 81 mg by mouth daily.   Blood Glucose Monitoring Suppl KIT Check BS 1-2 hours after starting lunch   Cholecalciferol (VITAMIN D3) 25 MCG (1000 UT) CAPS Take 1 capsule (1,000 Units total) by mouth daily.   cyanocobalamin  (VITAMIN B12) 1000 MCG tablet Take 1 tablet (1,000  mcg total) by mouth daily.   ezetimibe  (ZETIA ) 10 MG tablet TAKE 1 TABLET BY MOUTH EVERY DAY    gabapentin  (NEURONTIN ) 300 MG capsule Take 1 capsule (300 mg total) by mouth at bedtime.   glucose blood test strip Use as instructed   Lancets MISC Used as directed   metoprolol  tartrate (LOPRESSOR ) 25 MG tablet TAKE 1 TABLET BY MOUTH TWICE A DAY   nitroGLYCERIN  (NITROSTAT ) 0.4 MG SL tablet Place 1 tablet (0.4 mg total) under the tongue every 5 (five) minutes as needed for chest pain.   pantoprazole  (PROTONIX ) 40 MG tablet TAKE 1 TABLET BY MOUTH EVERY DAY   ramipril  (ALTACE ) 10 MG capsule TAKE 1 CAPSULE BY MOUTH EVERY DAY   rosuvastatin  (CRESTOR ) 40 MG tablet TAKE 1 TABLET BY MOUTH EVERY DAY   Evolocumab  (REPATHA  SURECLICK) 140 MG/ML SOAJ Inject 140 mg into the skin every 14 (fourteen) days. (Patient not taking: Reported on 01/28/2024)   No facility-administered encounter medications on file as of 01/28/2024.    History: Past Medical History:  Diagnosis Date   Allergy    Aortic valve sclerosis    echo 12/2008, soft systolic murmur   Arthritis    in right hip   Back pain    Basal cell carcinoma 05/31/2016   bcc left forehead  tx exc   BCC (basal cell carcinoma of skin) 08/18/2023   chest - medial (center) - Ex needed    Blood transfusion without reported diagnosis    Carotid artery disease    40-59% bilateral, doppler December 2010   Cataract    Constipation    Coronary artery disease    a. BMS-mid RCA 11/2007 b. stress echo 02/2009 subtle inferior HK, lateral ST depressions with stress c. follow-up cath 02/2009: RCA stent patent, severe but stable stenosis of distal posterior lateral branch RCA. LV normal, med tx unless more sx c. ETT Myoview  08/27/12: negative for ischemia; EF 64%   Decreased hearing    Depression    Mild, [name], 2012   Dyslipidemia    Easy bruising    Ejection fraction    EF normal, catheterization, 2011 /  EF 55%, echo, 2010   Emphysema of lung (HCC)    Fluid overload    Mild, stable since hospitalization in the past   GERD (gastroesophageal reflux  disease)    occarionally, takes Pepcid  over the counter   Heart murmur    has, occasional PVC's   History of right hip replacement    Hyperlipidemia    Hypertension    Joint pain    Myocardial infarction (HCC)    2009   Palpitations    PVC (premature ventricular contraction)    per prior Holter monitor   Right hip pain 12/2009   Shellfish allergy    Stress fracture    Left foot   Trouble in sleeping    Vitamin B12 deficiency    Vitamin D  deficiency    Past Surgical History:  Procedure Laterality Date   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/01/2005   BREAST BIOPSY Left    carcinoid tumor removal     colon-   CARDIAC CATHETERIZATION  1/211   Patent RCA stent, stenotic PLB supplying small distribution; medically managed   CATARACT EXTRACTION, BILATERAL     CORONARY ANGIOPLASTY WITH STENT PLACEMENT  11/2007   BMS-mid RCA   DIAGNOSTIC LAPAROSCOPY     1982 for endometriosis   DILATION AND CURETTAGE OF UTERUS  2005   JOINT REPLACEMENT  2016  SKIN CANCER EXCISION     TOTAL HIP ARTHROPLASTY Right 07/08/2014   Procedure: RIGHT TOTAL HIP ARTHROPLASTY ANTERIOR APPROACH;  Surgeon: Lonni CINDERELLA Poli, MD;  Location: [facility];  Service: Orthopedics;  Laterality: Right;   WISDOM TOOTH EXTRACTION     Family History  Problem Relation Age of Onset   Stroke Mother    Hyperlipidemia Mother    Hypertension Mother    Heart disease Mother    Obesity Mother    Arthritis Mother    COPD Mother    Varicose Veins Mother    Heart attack Father 58   Hyperlipidemia Father    Hypertension Father    Sudden death Father    Heart disease Father    Heart disease Sister    COPD Sister    Hyperlipidemia Sister    Heart attack Paternal Grandfather    Vision loss Maternal Aunt    BRCA 1/2 Neg Hx    Breast cancer Neg Hx    Esophageal cancer Neg Hx    Colon cancer Neg Hx    Pancreatic cancer Neg Hx    Liver disease Neg Hx    Stomach cancer Neg Hx    Colon polyps Neg Hx    Rectal cancer Neg Hx     Social History   Occupational History   Occupation: Retired - Publishing Copy  Tobacco Use   Smoking status: Never   Smokeless tobacco: Never  Vaping Use   Vaping status: Never Used  Substance and Sexual Activity   Alcohol use: Yes    Alcohol/week: 1.0 standard drink of alcohol    Types: 1 Glasses of wine per week    Comment: 1 glass of wine 3x/week   Drug use: No   Sexual activity: Yes    Partners: Male    Comment: divorced   Tobacco Counseling Counseling given: Not Answered  SDOH Screenings   Food Insecurity: No Food Insecurity (01/28/2024)  Housing: Unknown (01/28/2024)  Transportation Needs: No Transportation Needs (01/28/2024)  Utilities: Not At Risk (01/28/2024)  Alcohol Screen: Low Risk  (11/16/2023)  Depression (PHQ2-9): Low Risk  (01/28/2024)  Financial Resource Strain: Low Risk  (11/16/2023)  Physical Activity: Insufficiently Active (01/28/2024)  Social Connections: Moderately Isolated (01/28/2024)  Stress: No Stress Concern Present (01/28/2024)  Recent Concern: Stress - Stress Concern Present (11/16/2023)  Tobacco Use: Low Risk  (01/28/2024)  Health Literacy: Adequate Health Literacy (01/28/2024)   See flowsheets for full screening details  Depression Screen PHQ 2 & 9 Depression Scale- Over the past 2 weeks, how often have you been bothered by any of the following problems? Little interest or pleasure in doing things: 0 Feeling down, depressed, or hopeless (PHQ Adolescent also includes...irritable): 0 PHQ-2 Total Score: 0 Trouble falling or staying asleep, or sleeping too much: 0 Feeling tired or having little energy: 0 Poor appetite or overeating (PHQ Adolescent also includes...weight loss): 0 Feeling bad about yourself - or that you are a failure or have let yourself or your family down: 0 Trouble concentrating on things, such as reading the newspaper or watching television (PHQ Adolescent also includes...like school work): 0 Moving or speaking so  slowly that other people could have noticed. Or the opposite - being so fidgety or restless that you have been moving around a lot more than usual: 0 Thoughts that you would be better off dead, or of hurting yourself in some way: 0 PHQ-9 Total Score: 0 If you checked off any problems, how difficult have these problems made  it for you to do your work, take care of things at home, or get along with other people?: Not difficult at all  Depression Treatment Depression Interventions/Treatment : EYV7-0 Score <4 Follow-up Not Indicated     Goals Addressed               This Visit's Progress     Patient Stated (pt-stated)        Patient stated she plans to continue doing health and weight management to monitor weight and get adjusted to hearing aids             Objective:    Today's Vitals   01/28/24 0932  BP: (!) 140/78  Pulse: 60  SpO2: 97%  Weight: 166 lb 3.2 oz (75.4 kg)  Height: 5' (1.524 m)   Body mass index is 32.46 kg/m.  Hearing/Vision screen Hearing Screening - Comments:: Wears hearing aids Vision Screening - Comments:: Wears rx glasses - up to date with routine eye exams with Elspeth Pinal  Immunizations and Health Maintenance Health Maintenance  Topic Date Due   Cervical Cancer Screening (Pap smear)  11/23/2019   COVID-19 Vaccine (4 - 2025-26 season) 10/20/2023   Mammogram  07/07/2024   Medicare Annual Wellness (AWV)  01/27/2025   DTaP/Tdap/Td (3 - Td or Tdap) 11/28/2025   Pneumococcal Vaccine: 50+ Years  Completed   Influenza Vaccine  Completed   Bone Density Scan  Completed   Hepatitis C Screening  Completed   Zoster Vaccines- Shingrix  Completed   Meningococcal B Vaccine  Aged Out   Colonoscopy  Discontinued        Assessment/Plan:  This is a routine wellness examination for [place].  Patient Care Team: Purcell Emil Schanz, MD as PCP - General (Internal Medicine) Jordan, Peter M, MD as PCP - Cardiology (Cardiology) Verdon Louann BIRCH, MD as  Consulting Physician (Family Medicine) Jordan, Peter M, MD as Consulting Physician (Cardiology) Pinal Elspeth CROME, OD as Consulting Physician (Optometry) Livingston Rigg, MD as Consulting Physician (Dermatology)  I have personally reviewed and noted the following in the patients chart:   Medical and social history Use of alcohol, tobacco or illicit drugs  Current medications and supplements including opioid prescriptions. Functional ability and status Nutritional status Physical activity Advanced directives List of other physicians Hospitalizations, surgeries, and ER visits in previous 12 months Vitals Screenings to include cognitive, depression, and falls Referrals and appointments  No orders of the defined types were placed in this encounter.  In addition, I have reviewed and discussed with patient certain preventive protocols, quality metrics, and best practice recommendations. A written personalized care plan for preventive services as well as general preventive health recommendations were provided to patient.   Verdie CHRISTELLA Saba, CMA   01/28/2024   Return in 1 year (on 01/27/2025).  After Visit Summary: (In Person-Declined) Patient declined AVS at this time.  Nurse Notes: Appointment(s) made: (2026 AWV/CPE appts for 01/2025)

## 2024-02-03 ENCOUNTER — Ambulatory Visit: Admitting: Dermatology

## 2024-02-03 ENCOUNTER — Encounter: Payer: Self-pay | Admitting: Dermatology

## 2024-02-03 VITALS — BP 117/60 | HR 65

## 2024-02-03 DIAGNOSIS — L910 Hypertrophic scar: Secondary | ICD-10-CM

## 2024-02-03 DIAGNOSIS — C4491 Basal cell carcinoma of skin, unspecified: Secondary | ICD-10-CM

## 2024-02-03 MED ORDER — TRIAMCINOLONE ACETONIDE 10 MG/ML IJ SUSP
10.0000 mg | Freq: Once | INTRAMUSCULAR | Status: AC
  Administered 2024-02-03: 13:00:00 10 mg

## 2024-02-03 NOTE — Patient Instructions (Signed)

## 2024-02-03 NOTE — Progress Notes (Signed)
° °  Follow Up Visit   Subjective  Courtney [name] is a 73 y.o. female who presents for the following: follow up from surgery   The patient presents for follow up from surgery for a BCC on the chest-medial, treated on 10/02/23, repaired with linear closure. The patient has been bandaging the wound as directed. She had ILK5 to her chest last time with minimal improvement.  The following portions of the chart were reviewed this encounter and updated as appropriate: medications, allergies, medical history  Review of Systems:  No other skin or systemic complaints except as noted in HPI or Assessment and Plan.  Objective  Well appearing patient in no apparent distress; mood and affect are within normal limits.  A focal examination was performed including scalp, head, face and medial chest. All findings within normal limits unless otherwise noted below.  Healing wound with mild erythema  Relevant physical exam findings are noted in the Assessment and Plan.  Chest - Medial Courtney [name]) Procedure Note Intralesional Injection  Location: chest  Informed Consent: Discussed risks (infection, pain, bleeding, bruising, thinning of the skin, loss of skin pigment, lack of resolution, and recurrence of lesion) and benefits of the procedure, as well as the alternatives. Informed consent was obtained. Preparation: The area was prepared a standard fashion.  Anesthesia:N/A  Procedure Details: An intralesional injection was performed with Kenalog  10 mg/cc. 0.5cc in total were injected. NDC #: 9996-9505-79 Exp: 05/18/24  Total number of injections: 1  Plan: The patient was instructed on post-op care. Recommend OTC analgesia as needed for pain.   Assessment & Plan    Hypertrophic Scar s/p Mohs for Courtney [name], treated on 10/02/23, repaired with linear closure - Reassured that wound is healing well - No evidence of infection - No swelling, induration, purulence, dehiscence, or tenderness out of proportion to the  clinical exam, see photo above - Discussed that scars take up to 12 months to mature from the date of surgery - Recommend SPF 30+ to scar daily to prevent purple color from UV exposure during scar maturation process - Discussed that erythema and raised appearance of scar will fade over the next 4-6 months - OK to start scar massage at 4-6 weeks post-op - Can consider silicone based products for scar healing starting at 6 weeks post-op - s/p 1 round of ILK5  HISTORY OF BASAL CELL CARCINOMA OF THE SKIN - No evidence of recurrence today - Recommend regular full body skin exams - Recommend daily broad spectrum sunscreen SPF 30+ to sun-exposed areas, reapply every 2 hours as needed.  - Call if any new or changing lesions are noted between office visits HYPERTROPHIC SCAR Chest - Medial Courtney [name]) This Visit - triamcinolone  acetonide (KENALOG ) 10 MG/ML injection 10 mg  Return in about 4 weeks (around 03/02/2024) for 4-6 weeks hypertrophic scar follow up.  I, Darice Smock, CMA, am acting as scribe for RUFUS CHRISTELLA HOLY, MD.   Documentation: I have reviewed the above documentation for accuracy and completeness, and I agree with the above.  RUFUS CHRISTELLA HOLY, MD

## 2024-02-10 ENCOUNTER — Telehealth: Admitting: Physician Assistant

## 2024-02-10 DIAGNOSIS — A084 Viral intestinal infection, unspecified: Secondary | ICD-10-CM

## 2024-02-10 MED ORDER — ONDANSETRON 4 MG PO TBDP: 4.0000 mg | ORAL_TABLET | Freq: Three times a day (TID) | ORAL | 0 refills | Status: DC | PRN

## 2024-02-10 MED ORDER — ONDANSETRON HCL 4 MG PO TABS: 4.0000 mg | ORAL_TABLET | Freq: Three times a day (TID) | ORAL | 0 refills | Status: AC | PRN

## 2024-02-10 NOTE — Progress Notes (Signed)
 E-Visit for Nausea and Vomiting   We are sorry that you are not feeling well. Here is how we plan to help!  Based on what you have shared with me it looks like you have a Virus that is irritating your GI tract.  Vomiting is the forceful emptying of a portion of the stomach's content through the mouth.  Although nausea and vomiting can make you feel miserable, it's important to remember that these are not diseases, but rather symptoms of an underlying illness.  When we treat short term symptoms, we always caution that any symptoms that persist should be fully evaluated in a medical office.  I have prescribed a medication that will help alleviate your symptoms and allow you to stay hydrated:  Zofran 4 mg 1 tablet every 8 hours as needed for nausea and vomiting  For your symptoms of diarrhea you may take Imodium 2 mg tablets that are over the counter at your local pharmacy. Take two tablet now and then one after each loose stool up to 6 a day.    HOME CARE: Drink clear liquids.  This is very important! Dehydration (the lack of fluid) can lead to a serious complication.  Start off with 1 tablespoon every 5 minutes for 8 hours. You may begin eating bland foods after 8 hours without vomiting.  Start with saltine crackers, white bread, rice, mashed potatoes, applesauce. After 48 hours on a bland diet, you may resume a normal diet. Try to go to sleep.  Sleep often empties the stomach and relieves the need to vomit.  GET HELP RIGHT AWAY IF:  Your symptoms do not improve or worsen within 2 days after treatment. You have a fever for over 3 days. You cannot keep down fluids after trying the medication.  MAKE SURE YOU:  Understand these instructions. Will watch your condition. Will get help right away if you are not doing well or get worse.    Thank you for choosing an e-visit.  Your e-visit answers were reviewed by a board certified advanced clinical practitioner to complete your personal care  plan. Depending upon the condition, your plan could have included both over the counter or prescription medications.  Please review your pharmacy choice. Make sure the pharmacy is open so you can pick up prescription now. If there is a problem, you may contact your provider through [employer] and have the prescription routed to another pharmacy.  Your safety is important to [place]. If you have drug allergies check your prescription carefully.   For the next 24 hours you can use MyChart to ask questions about today's visit, request a non-urgent call back, or ask for a work or school excuse. You will get an email in the next two days asking about your experience. I hope that your e-visit has been valuable and will speed your recovery.  I have spent 5 minutes in review of e-visit questionnaire, review and updating patient chart, medical decision making and response to patient.   Margaretann Loveless, PA-C

## 2024-02-10 NOTE — Addendum Note (Signed)
 Addended by: VIVIENNE DELON HERO on: 02/10/2024 06:45 PM   Modules accepted: Orders

## 2024-02-25 ENCOUNTER — Ambulatory Visit (INDEPENDENT_AMBULATORY_CARE_PROVIDER_SITE_OTHER): Admitting: Family Medicine

## 2024-02-26 ENCOUNTER — Telehealth (INDEPENDENT_AMBULATORY_CARE_PROVIDER_SITE_OTHER): Admitting: Family Medicine

## 2024-02-26 ENCOUNTER — Encounter (INDEPENDENT_AMBULATORY_CARE_PROVIDER_SITE_OTHER): Payer: Self-pay | Admitting: Family Medicine

## 2024-02-26 VITALS — BP 130/63 | HR 53 | Ht 60.0 in

## 2024-02-26 DIAGNOSIS — E538 Deficiency of other specified B group vitamins: Secondary | ICD-10-CM

## 2024-02-26 DIAGNOSIS — E669 Obesity, unspecified: Secondary | ICD-10-CM

## 2024-02-26 DIAGNOSIS — R7303 Prediabetes: Secondary | ICD-10-CM

## 2024-02-26 DIAGNOSIS — E559 Vitamin D deficiency, unspecified: Secondary | ICD-10-CM | POA: Diagnosis not present

## 2024-02-26 DIAGNOSIS — Z6832 Body mass index (BMI) 32.0-32.9, adult: Secondary | ICD-10-CM | POA: Diagnosis not present

## 2024-02-26 DIAGNOSIS — Z6831 Body mass index (BMI) 31.0-31.9, adult: Secondary | ICD-10-CM

## 2024-02-26 DIAGNOSIS — E782 Mixed hyperlipidemia: Secondary | ICD-10-CM | POA: Diagnosis not present

## 2024-02-26 NOTE — Progress Notes (Signed)
 "  Office: [phone]  /  Fax: [phone]  WEIGHT SUMMARY AND BIOMETRICS  Anthropometric Measurements Height: 5' (1.524 m) Weight: -- (Virtual Visit) Weight at Last Visit: 160 lb Starting Weight: 157 lb Peak Weight: 162 lb   No data recorded Other Clinical Data Fasting: N/A Labs: N/A Today's Visit #: 17 Starting Date: 06/16/17 Comments: Courtney [name] VIDEO VISIT    Chief Complaint: OBESITY  Virtual Visit via A/V Note  I connected with Courtney [name] on 02/26/2024 at 10:40 AM EST by audiovisual telehealth and verified that I am speaking with the correct person using two identifiers.  Location: Patient: home Provider: home   I discussed the limitations, risks, security and privacy concerns of performing an evaluation and management service by AV telehealth and the availability of in person appointments. I also discussed with the patient that there may be a patient responsible charge related to this service. The patient expressed understanding and agreed to proceed.     History of Present Illness Courtney [name] is a 74 year old female with obesity and prediabetes who presents for obesity treatment and progress assessment.  She has been following the category one eating plan approximately 70% of the time and believes she has maintained her weight over the holidays. She is not currently engaging in regular exercise but is making efforts to stay hydrated. She often skips meals due to lack of hunger but feels she is consuming her recommended amount of protein.  In managing her prediabetes, her most recent hemoglobin A1c was 5.9% in July of last year. She is focusing on diet, exercise, and weight loss to help control her condition. She is not currently taking metformin .  She plans to resume exercise at [employer], particularly enjoying swimming, and intends to use the recumbent bike and walk on the track.  She requests lab work in preparation for her upcoming cardiologist appointment.  She has hyperlipidemia, prediabetes, B12 deficiency, and vitamin D  deficiency.  She is working on returning to a structured eating plan, eating out less, and meal planning. She anticipates some indulgence at her partner's retirement party but plans to get back on track soon after.      PHYSICAL EXAM:  Blood pressure 130/63, pulse (!) 53, height 5' (1.524 m). Body mass index is 32.46 kg/m.  DIAGNOSTIC DATA REVIEWED:  BMET    Component Value Date/Time   NA 141 09/11/2023 0934   K 4.8 09/11/2023 0934   CL 106 09/11/2023 0934   CO2 19 (L) 09/11/2023 0934   GLUCOSE 101 (H) 09/11/2023 0934   GLUCOSE 244 (H) 05/27/2023 2310   BUN 22 09/11/2023 0934   CREATININE 0.86 09/11/2023 0934   CALCIUM  9.7 09/11/2023 0934   GFRNONAA >60 05/27/2023 2310   GFRAA 63 03/30/2020 1030   Lab Results  Component Value Date   HGBA1C 5.9 (H) 09/11/2023   HGBA1C 5.6 06/16/2017   Lab Results  Component Value Date   INSULIN  10.5 09/11/2023   INSULIN  6.4 06/16/2017   Lab Results  Component Value Date   TSH 1.030 09/11/2023   CBC    Component Value Date/Time   WBC 6.7 09/11/2023 0934   WBC 8.4 05/27/2023 2310   RBC 3.97 09/11/2023 0934   RBC 3.93 05/27/2023 2310   HGB 12.4 09/11/2023 0934   HCT 37.7 09/11/2023 0934   PLT 233 09/11/2023 0934   MCV 95 09/11/2023 0934   MCH 31.2 09/11/2023 0934   MCH 31.6 05/27/2023 2310   MCHC 32.9 09/11/2023 0934  MCHC 33.5 05/27/2023 2310   RDW 12.0 09/11/2023 0934   Iron Studies    Component Value Date/Time   IRON 117 10/17/2020 1013   IRON 100 01/25/2019 0853   TIBC 431.2 10/17/2020 1013   TIBC 328 01/25/2019 0853   FERRITIN 133.2 10/17/2020 1013   FERRITIN 54 01/25/2019 0853   IRONPCTSAT 27.1 10/17/2020 1013   IRONPCTSAT 30 01/25/2019 0853   Lipid Panel     Component Value Date/Time   CHOL 128 09/11/2023 0934   TRIG 79 09/11/2023 0934   HDL 70 09/11/2023 0934   CHOLHDL 1.7 03/25/2023 1100   CHOLHDL 3 11/30/2015 1028   VLDL 25.4  11/30/2015 1028   LDLCALC 43 09/11/2023 0934   Hepatic Function Panel     Component Value Date/Time   PROT 6.4 09/11/2023 0934   ALBUMIN  4.2 09/11/2023 0934   AST 18 09/11/2023 0934   ALT 14 09/11/2023 0934   ALKPHOS 78 09/11/2023 0934   BILITOT 0.2 09/11/2023 0934   BILIDIR 0.13 05/15/2021 1000      Component Value Date/Time   TSH 1.030 09/11/2023 0934   Nutritional Lab Results  Component Value Date   VD25OH 42.4 09/11/2023   VD25OH 90.1 10/15/2022   VD25OH 83.1 02/26/2022     Assessment and Plan Assessment & Plan Generalized obesity She is following the category one eating plan approximately 70% of the time and has maintained her weight over the holidays. She is not currently exercising but plans to resume exercise at [employer], including pool activities, recumbent bike, and walking on the track. She is working on meal planning and reducing eating out, with some anticipated indulgence due to a partner's retirement party. - Prescribed the category one eating plan - Encouraged resumption of exercise at [employer], including pool activities, recumbent bike, and walking on the track - Advised on meal planning and reducing eating out  Prediabetes Her most recent hemoglobin A1c was 5.9 in July of last year. She is working on diet, exercise, and weight loss to manage and improve her prediabetes. She is not currently on metformin . - Ordered labs to monitor prediabetes status - Encouraged diet, exercise, and weight loss  Mixed hyperlipidemia She has mixed hyperlipidemia and requests labs to be done before her cardiology appointment. - Ordered labs to monitor hyperlipidemia - Continue diet, exercise and weight loss as discussed today as an important part of the treatment plan   Vitamin B12 deficiency She has vitamin B12 deficiency and requires lab monitoring. - Ordered labs to monitor vitamin B12 levels  Vitamin D  deficiency She has vitamin D  deficiency and requires lab  monitoring. - Ordered labs to monitor vitamin D  levels      Patients who are on anti-obesity medications are counseled on the importance of maintaining healthy lifestyle habits, including balanced nutrition, regular physical activity, and behavioral modifications,  Medication is an adjunct to, not a replacement for, lifestyle changes and that the long-term success and weight maintenance depend on continued adherence to these strategies.   Courtney [name] was informed of the importance of frequent follow up visits to maximize her success with intensive lifestyle modifications for her obesity and obesity related health conditions as recommended by USPSTF and CMS guidelines  Louann Penton, MD   "

## 2024-03-04 ENCOUNTER — Other Ambulatory Visit: Payer: Self-pay | Admitting: Cardiology

## 2024-03-04 NOTE — Progress Notes (Signed)
 " Cardiology Office Note:    Date:  03/09/2024   ID:  Courtney [name], DOB [birth year], MRN [id number]  PCP:  Purcell Emil Schanz, MD  Cardiologist:  Tanav Orsak, MD   Referring MD: Purcell Emil Schanz, MD   Chief Complaint  Patient presents with   Coronary Artery Disease   Aortic Stenosis    History of Present Illness:    Courtney [name] is a 74 y.o. female with a hx of CAD, carotid artery disease, HLD, HTN, and PVCs. She is s/p BMS to mid RCA in 2009. Abnormal nuclear stress test in 2011 led to a repeat cath that showed nonobstructive disease. Repeat myoview  in Oct 2018 was normal.   On her visit in 2022  a murmur was noted. Echo was obtained showing mild Aortic stenosis.  Evaluation in April/May 2025 with Echo showed normal LV function. Mild to mod AS. PET CT showed no ischemia or infarct. EF 61%  On follow up today she is feeling well. No chest pain, dyspnea or palpitations. No dizziness. BP is well controlled.   Past Medical History:  Diagnosis Date   Allergy    Aortic valve sclerosis    echo 12/2008, soft systolic murmur   Arthritis    in right hip   Back pain    Basal cell carcinoma 05/31/2016   bcc left forehead  tx exc   BCC (basal cell carcinoma of skin) 08/18/2023   chest - medial (center) - Ex needed    Blood transfusion without reported diagnosis    Carotid artery disease    40-59% bilateral, doppler December 2010   Cataract    Constipation    Coronary artery disease    a. BMS-mid RCA 11/2007 b. stress echo 02/2009 subtle inferior HK, lateral ST depressions with stress c. follow-up cath 02/2009: RCA stent patent, severe but stable stenosis of distal posterior lateral branch RCA. LV normal, med tx unless more sx c. ETT Myoview  08/27/12: negative for ischemia; EF 64%   Decreased hearing    Depression    Mild, August, 2012   Dyslipidemia    Easy bruising    Ejection fraction    EF normal, catheterization, 2011 /  EF 55%, echo, 2010   Emphysema of lung (HCC)     Fluid overload    Mild, stable since hospitalization in the past   GERD (gastroesophageal reflux disease)    occarionally, takes Pepcid  over the counter   Heart murmur    has, occasional PVC's   History of right hip replacement    Hyperlipidemia    Hypertension    Joint pain    Myocardial infarction (HCC)    2009   Palpitations    PVC (premature ventricular contraction)    per prior Holter monitor   Right hip pain 12/2009   Shellfish allergy    Stress fracture    Left foot   Trouble in sleeping    Vitamin B12 deficiency    Vitamin D  deficiency     Past Surgical History:  Procedure Laterality Date   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/01/2005   BREAST BIOPSY Left    carcinoid tumor removal     colon-   CARDIAC CATHETERIZATION  1/211   Patent RCA stent, stenotic PLB supplying small distribution; medically managed   CATARACT EXTRACTION, BILATERAL     CORONARY ANGIOPLASTY WITH STENT PLACEMENT  11/2007   BMS-mid RCA   DIAGNOSTIC LAPAROSCOPY     1982 for endometriosis   DILATION AND CURETTAGE  OF UTERUS  2005   JOINT REPLACEMENT  2016   SKIN CANCER EXCISION     TOTAL HIP ARTHROPLASTY Right 07/08/2014   Procedure: RIGHT TOTAL HIP ARTHROPLASTY ANTERIOR APPROACH;  Surgeon: Lonni CINDERELLA Poli, MD;  Location: [facility];  Service: Orthopedics;  Laterality: Right;   WISDOM TOOTH EXTRACTION      Current Medications: Current Meds  Medication Sig   amLODipine  (NORVASC ) 5 MG tablet Take 1 tablet (5 mg total) by mouth daily.   aspirin  81 MG tablet Take 81 mg by mouth daily.   Cholecalciferol (VITAMIN D3) 25 MCG (1000 UT) CAPS Take 1 capsule (1,000 Units total) by mouth daily.   cyanocobalamin  (VITAMIN B12) 1000 MCG tablet Take 1 tablet (1,000 mcg total) by mouth daily.   Evolocumab  (REPATHA  SURECLICK) 140 MG/ML SOAJ Inject 140 mg into the skin every 14 (fourteen) days.   gabapentin  (NEURONTIN ) 300 MG capsule Take 1 capsule (300 mg total) by mouth at bedtime.   metoprolol  tartrate (LOPRESSOR )  25 MG tablet TAKE 1 TABLET BY MOUTH TWICE A DAY   nitroGLYCERIN  (NITROSTAT ) 0.4 MG SL tablet Place 1 tablet (0.4 mg total) under the tongue every 5 (five) minutes as needed for chest pain.   ondansetron  (ZOFRAN ) 4 MG tablet Take 1 tablet (4 mg total) by mouth every 8 (eight) hours as needed for nausea or vomiting.   pantoprazole  (PROTONIX ) 40 MG tablet TAKE 1 TABLET BY MOUTH EVERY DAY   [DISCONTINUED] ezetimibe  (ZETIA ) 10 MG tablet TAKE 1 TABLET BY MOUTH EVERY DAY   [DISCONTINUED] ramipril  (ALTACE ) 10 MG capsule TAKE 1 CAPSULE BY MOUTH EVERY DAY (Patient taking differently: Take 10 mg by mouth 2 (two) times daily.)   [DISCONTINUED] rosuvastatin  (CRESTOR ) 40 MG tablet TAKE 1 TABLET BY MOUTH EVERY DAY     Allergies:   Erythromycin, Penicillins, Shellfish allergy, and Doxycycline    Social History   Socioeconomic History   Marital status: Divorced    Spouse name: Not on file   Number of children: 1   Years of education: 18   Highest education level: Master's degree (e.g., MA, MS, MEng, MEd, MSW, MBA)  Occupational History   Occupation: Retired - Publishing Copy  Tobacco Use   Smoking status: Never   Smokeless tobacco: Never  Vaping Use   Vaping status: Never Used  Substance and Sexual Activity   Alcohol use: Yes    Alcohol/week: 1.0 standard drink of alcohol    Types: 1 Glasses of wine per week    Comment: 1 glass of wine 3x/week   Drug use: No   Sexual activity: Yes    Partners: Male    Comment: divorced  Other Topics Concern   Not on file  Social History Narrative   Right handed   Soda- sometimes   Drinks herbal tea   Fun: Dance, garden,    Denies religious beliefs effecting health care.   Denies abuse and feels safe at home   Social Drivers of Health   Tobacco Use: Low Risk (03/09/2024)   Patient History    Smoking Tobacco Use: Never    Smokeless Tobacco Use: Never    Passive Exposure: Not on file  Financial Resource Strain: Low Risk (11/16/2023)   Overall Financial  Resource Strain (CARDIA)    Difficulty of Paying Living Expenses: Not hard at all  Food Insecurity: No Food Insecurity (01/28/2024)   Epic    Worried About Radiation Protection Practitioner of Food in the Last Year: Never true    Ran Out of  Food in the Last Year: Never true  Transportation Needs: No Transportation Needs (01/28/2024)   Epic    Lack of Transportation (Medical): No    Lack of Transportation (Non-Medical): No  Physical Activity: Insufficiently Active (01/28/2024)   Exercise Vital Sign    Days of Exercise per Week: 1 day    Minutes of Exercise per Session: 30 min  Stress: No Stress Concern Present (01/28/2024)   [employer] of Occupational Health - Occupational Stress Questionnaire    Feeling of Stress: Not at all  Recent Concern: Stress - Stress Concern Present (11/16/2023)   [employer] of Occupational Health - Occupational Stress Questionnaire    Feeling of Stress: To some extent  Social Connections: Moderately Isolated (01/28/2024)   Social Connection and Isolation Panel    Frequency of Communication with Friends and Family: More than three times a week    Frequency of Social Gatherings with Friends and Family: Once a week    Attends Religious Services: More than 4 times per year    Active Member of Clubs or Organizations: No    Attends Banker Meetings: Never    Marital Status: Divorced  Depression (PHQ2-9): Low Risk (01/28/2024)   Depression (PHQ2-9)    PHQ-2 Score: 0  Alcohol Screen: Low Risk (11/16/2023)   Alcohol Screen    Last Alcohol Screening Score (AUDIT): 4  Housing: Unknown (01/28/2024)   Epic    Unable to Pay for Housing in the Last Year: No    Number of Times Moved in the Last Year: Not on file    Homeless in the Last Year: No  Utilities: Not At Risk (01/28/2024)   Epic    Threatened with loss of utilities: No  Health Literacy: Adequate Health Literacy (01/28/2024)   B1300 Health Literacy    Frequency of need for help with medical  instructions: Never     Family History: The patient's family history includes Arthritis in her mother; COPD in her mother and sister; Heart attack in her paternal grandfather; Heart attack (age of onset: 10) in her father; Heart disease in her father, mother, and sister; Hyperlipidemia in her father, mother, and sister; Hypertension in her father and mother; Obesity in her mother; Stroke in her mother; Sudden death in her father; Varicose Veins in her mother; Vision loss in her maternal aunt. There is no history of BRCA 1/2, Breast cancer, Esophageal cancer, Colon cancer, Pancreatic cancer, Liver disease, Stomach cancer, Colon polyps, or Rectal cancer.  ROS:   Please see the history of present illness.    All other systems reviewed and are negative.  EKGs/Labs/Other Studies Reviewed:    The following studies were reviewed today:  Carotid ultrasound 2020 Summary:  Right Carotid: Velocities in the right ICA are consistent with a 1-39%  stenosis.   Left Carotid: Velocities in the left ICA are consistent with a 1-39%  stenosis.   Vertebrals:  Bilateral vertebral arteries demonstrate antegrade flow.  Subclavians: Normal flow hemodynamics were seen in bilateral subclavian arterie   Echo 10/03/20: IMPRESSIONS     1. Left ventricular ejection fraction, by estimation, is 65 to 70%. The  left ventricle has normal function. The left ventricle has no regional  wall motion abnormalities. Left ventricular diastolic parameters are  consistent with Grade I diastolic  dysfunction (impaired relaxation). The average left ventricular global  longitudinal strain is -22.0 %. The global longitudinal strain is normal.   2. Right ventricular systolic function is normal. The right ventricular  size  is normal. There is normal pulmonary artery systolic pressure.   3. The mitral valve is normal in structure. Trivial mitral valve  regurgitation. No evidence of mitral stenosis.   4. The aortic valve is  tricuspid. There is mild calcification of the  aortic valve. There is mild thickening of the aortic valve. Aortic valve  regurgitation is trivial. Mild aortic valve stenosis.   5. The inferior vena cava is normal in size with greater than 50%  respiratory variability, suggesting right atrial pressure of 3 mmHg.          Echo 06/30/23: IMPRESSIONS     1. Left ventricular ejection fraction, by estimation, is 65 to 70%. Left  ventricular ejection fraction by 3D volume is 69 %. The left ventricle has  normal function. The left ventricle has no regional wall motion  abnormalities. Left ventricular diastolic   parameters were normal. The average left ventricular global longitudinal  strain is -19.3 %. The global longitudinal strain is normal.   2. Right ventricular systolic function is normal. The right ventricular  size is normal. There is normal pulmonary artery systolic pressure. The  estimated right ventricular systolic pressure is 30.0 mmHg.   3. The mitral valve is normal in structure. Trivial mitral valve  regurgitation.   4. The aortic valve is tricuspid. Aortic valve regurgitation is trivial.  Mild to moderate aortic valve stenosis. Mild AS by gradients (Vmax 2.4  m/s, MG ), moderate by AVA (1.5cm^2) and DI (0.48)   5. The inferior vena cava is normal in size with greater than 50%  respiratory variability, suggesting right atrial pressure of 3 mmHg.   Cardiac PET CT 05/20/23:  Narrative & Impression      The study is normal. The study is low risk.   LV perfusion is normal. There is no evidence of ischemia. There is no evidence of infarction.   Rest left ventricular function is normal. Rest EF: 61%. Stress left ventricular function is normal. Stress EF: 64%. End diastolic cavity size is normal. End systolic cavity size is normal.   Myocardial blood flow was computed to be 0.30ml/g/min at rest and 1.58ml/g/min at stress. Global myocardial blood flow reserve was 1.95 and was  normal.   Coronary calcium  assessment not performed due to prior revascularization.   Electronically signed by Jerel Balding, MD.          Recent Labs: 09/11/2023: Hemoglobin 12.4; Platelets 233; TSH 1.030 03/08/2024: ALT 13; BUN 21; Creatinine, Ser 0.96; Potassium 4.6; Sodium 142  Recent Lipid Panel    Component Value Date/Time   CHOL 126 03/08/2024 0818   TRIG 89 03/08/2024 0818   HDL 79 03/08/2024 0818   CHOLHDL 1.7 03/25/2023 1100   CHOLHDL 3 11/30/2015 1028   VLDL 25.4 11/30/2015 1028   LDLCALC 30 03/08/2024 0818    Physical Exam:    VS:  BP (!) 120/44   Pulse (!) 49   Ht 5' (1.524 m)   Wt 164 lb 12.8 oz (74.8 kg)   SpO2 95%   BMI 32.19 kg/m     Wt Readings from Last 3 Encounters:  03/09/24 164 lb 12.8 oz (74.8 kg)  01/28/24 166 lb 3.2 oz (75.4 kg)  01/01/24 166 lb (75.3 kg)     GEN: Well nourished, well developed in no acute distress HEENT: Normal NECK: No JVD; No carotid bruits LYMPHATICS: No lymphadenopathy CARDIAC: regular rhythm, bradycardic rate, 1-2/6 SEM RUSB RESPIRATORY:  Clear to auscultation without rales, wheezing or rhonchi  ABDOMEN:  Soft, non-tender, non-distended MUSCULOSKELETAL:  No edema; No deformity  SKIN: Warm and dry NEUROLOGIC:  Alert and oriented x 3 PSYCHIATRIC:  Normal affect   ASSESSMENT:    1. Coronary artery disease involving native coronary artery of native heart with unstable angina pectoris (HCC)   2. Essential hypertension   3. Hyperlipidemia LDL goal <70   4. Nonrheumatic aortic valve stenosis        PLAN:    In order of problems listed above:  CAD s/p BMS-RCA 2009 - PET CT in April 2025 without ischemia. - no active chest  pain - continue ASA, metoprolol , amlodipine    2. Hypertension - well controlled now on Altace , metoprolol , amlodipine .    3. Hyperlipidemia with LDL goal < 55 On Zetia , Crestor , Repatha  - LDL 30   4. Mild- moderate aortic stenosis.  - asymptomatic - will follow   Follow up in  one year.    Medication Adjustments/Labs and Tests Ordered: Current medicines are reviewed at length with the patient today.  Concerns regarding medicines are outlined above.  No orders of the defined types were placed in this encounter.   Meds ordered this encounter  Medications   ezetimibe  (ZETIA ) 10 MG tablet    Sig: Take 1 tablet (10 mg total) by mouth daily.    Dispense:  90 tablet    Refill:  3   rosuvastatin  (CRESTOR ) 40 MG tablet    Sig: Take 1 tablet (40 mg total) by mouth daily.    Dispense:  90 tablet    Refill:  3   ramipril  (ALTACE ) 10 MG capsule    Sig: Take 1 capsule (10 mg total) by mouth daily.    Dispense:  90 capsule    Refill:  3     Signed, Harryette Shuart, MD  03/09/2024 9:20 AM    [facility]  "

## 2024-03-09 ENCOUNTER — Ambulatory Visit: Admitting: Cardiology

## 2024-03-09 ENCOUNTER — Encounter: Payer: Self-pay | Admitting: Cardiology

## 2024-03-09 VITALS — BP 120/44 | HR 49 | Ht 60.0 in | Wt 164.8 lb

## 2024-03-09 DIAGNOSIS — E785 Hyperlipidemia, unspecified: Secondary | ICD-10-CM

## 2024-03-09 DIAGNOSIS — I25110 Atherosclerotic heart disease of native coronary artery with unstable angina pectoris: Secondary | ICD-10-CM

## 2024-03-09 DIAGNOSIS — I350 Nonrheumatic aortic (valve) stenosis: Secondary | ICD-10-CM

## 2024-03-09 DIAGNOSIS — I10 Essential (primary) hypertension: Secondary | ICD-10-CM | POA: Diagnosis not present

## 2024-03-09 LAB — CMP14+EGFR
ALT: 13 IU/L (ref 0–32)
AST: 18 IU/L (ref 0–40)
Albumin: 4.3 g/dL (ref 3.8–4.8)
Alkaline Phosphatase: 81 IU/L (ref 49–135)
BUN/Creatinine Ratio: 22 (ref 12–28)
BUN: 21 mg/dL (ref 8–27)
Bilirubin Total: 0.3 mg/dL (ref 0.0–1.2)
CO2: 23 mmol/L (ref 20–29)
Calcium: 9.3 mg/dL (ref 8.7–10.3)
Chloride: 104 mmol/L (ref 96–106)
Creatinine, Ser: 0.96 mg/dL (ref 0.57–1.00)
Globulin, Total: 2.1 g/dL (ref 1.5–4.5)
Glucose: 98 mg/dL (ref 70–99)
Potassium: 4.6 mmol/L (ref 3.5–5.2)
Sodium: 142 mmol/L (ref 134–144)
Total Protein: 6.4 g/dL (ref 6.0–8.5)
eGFR: 62 mL/min/1.73

## 2024-03-09 LAB — INSULIN, RANDOM: INSULIN: 18.8 u[IU]/mL (ref 2.6–24.9)

## 2024-03-09 LAB — LIPID PANEL WITH LDL/HDL RATIO
Cholesterol, Total: 126 mg/dL (ref 100–199)
HDL: 79 mg/dL
LDL Chol Calc (NIH): 30 mg/dL (ref 0–99)
LDL/HDL Ratio: 0.4 ratio (ref 0.0–3.2)
Triglycerides: 89 mg/dL (ref 0–149)
VLDL Cholesterol Cal: 17 mg/dL (ref 5–40)

## 2024-03-09 LAB — VITAMIN D 25 HYDROXY (VIT D DEFICIENCY, FRACTURES): Vit D, 25-Hydroxy: 39.4 ng/mL (ref 30.0–100.0)

## 2024-03-09 LAB — VITAMIN B12: Vitamin B-12: 619 pg/mL (ref 232–1245)

## 2024-03-09 MED ORDER — ROSUVASTATIN CALCIUM 40 MG PO TABS: 40.0000 mg | ORAL_TABLET | Freq: Every day | ORAL | 3 refills | Status: AC

## 2024-03-09 MED ORDER — RAMIPRIL 10 MG PO CAPS: 10.0000 mg | ORAL_CAPSULE | Freq: Every day | ORAL | 3 refills | Status: AC

## 2024-03-09 MED ORDER — EZETIMIBE 10 MG PO TABS: 10.0000 mg | ORAL_TABLET | Freq: Every day | ORAL | 3 refills | Status: AC

## 2024-03-09 NOTE — Patient Instructions (Signed)
 Medication Instructions:  Continue same medications *If you need a refill on your cardiac medications before your next appointment, please call your pharmacy*  Lab Work: None ordered  Testing/Procedures: None ordered  Follow-Up: At [facility], you and your health needs are our priority.  As part of our continuing mission to provide you with exceptional heart care, our providers are all part of one team.  This team includes your primary Cardiologist (physician) and Advanced Practice Providers or APPs (Physician Assistants and Nurse Practitioners) who all work together to provide you with the care you need, when you need it.  Your next appointment:  1 year    Call in Oct to schedule Jan appointment     Provider:  Dr.Jordan    We recommend signing up for the patient portal called MyChart.  Sign up information is provided on this After Visit Summary.  MyChart is used to connect with patients for Virtual Visits (Telemedicine).  Patients are able to view lab/test results, encounter notes, upcoming appointments, etc.  Non-urgent messages can be sent to your provider as well.   To learn more about what you can do with MyChart, go to [email or web address].

## 2024-03-16 ENCOUNTER — Ambulatory Visit: Admitting: Dermatology

## 2024-03-18 ENCOUNTER — Encounter: Payer: Self-pay | Admitting: Internal Medicine

## 2024-03-18 ENCOUNTER — Encounter: Payer: Self-pay | Admitting: Dermatology

## 2024-03-18 ENCOUNTER — Ambulatory Visit: Admitting: Dermatology

## 2024-03-18 ENCOUNTER — Ambulatory Visit: Admitting: Internal Medicine

## 2024-03-18 VITALS — BP 134/68 | HR 60 | Temp 97.7°F | Resp 16 | Ht 60.0 in | Wt 164.0 lb

## 2024-03-18 DIAGNOSIS — J340 Abscess, furuncle and carbuncle of nose: Secondary | ICD-10-CM | POA: Diagnosis not present

## 2024-03-18 DIAGNOSIS — L910 Hypertrophic scar: Secondary | ICD-10-CM

## 2024-03-18 MED ORDER — RIFAMPIN 300 MG PO CAPS: 300.0000 mg | ORAL_CAPSULE | Freq: Two times a day (BID) | ORAL | 0 refills | Status: AC

## 2024-03-18 MED ORDER — SULFAMETHOXAZOLE-TRIMETHOPRIM 800-160 MG PO TABS: 1.0000 | ORAL_TABLET | Freq: Two times a day (BID) | ORAL | 0 refills | Status: AC

## 2024-03-18 NOTE — Patient Instructions (Signed)

## 2024-03-18 NOTE — Patient Instructions (Signed)

## 2024-03-18 NOTE — Progress Notes (Signed)
" ° °  Follow-Up Visit   History of Present Illness Courtney [name] is a 74 year old female who presents for follow-up after steroid injections for scar treatment.  She has experienced significant improvement in the appearance and texture of the scar following the second steroid injection. The scar is now flat and not itchy.  She continues to use a silicone patch over the scar, which she occasionally removes due to irritation and wrinkling. She denies any current itchiness of the scar.  Pt here to follow up on a hypertrophic scar on her chest that has previously been injected with kenalog --last injection on 02/03/24. Pt states it has improved.  The following portions of the chart were reviewed this encounter and updated as appropriate: medications, allergies, medical history  Review of Systems:  No other skin or systemic complaints except as noted in HPI or Assessment and Plan.  Objective  Well appearing patient in no apparent distress; mood and affect are within normal limits.  A focused examination was performed of the following areas: Medial chest  Relevant exam findings are noted in the Assessment and Plan.    Assessment & Plan   HYPERTROPHIC SCAR chest Exam: Dyspigmented papule or plaque  Improved with kenalog  injections.  Treatment Plan: Observe.   Return if symptoms worsen or fail to improve.  I, Darice Smock, CMA, am acting as scribe for RUFUS CHRISTELLA HOLY, MD.   Documentation: I have reviewed the above documentation for accuracy and completeness, and I agree with the above.  RUFUS CHRISTELLA HOLY, MD    "

## 2024-03-18 NOTE — Progress Notes (Signed)
 "  Subjective:  Patient ID: Courtney [name], female    DOB: 11-Sep-1950  Age: 74 y.o. MRN: [id number]  CC: Sore in Nostril  (Left nostril since Saturday and cause pain. )   HPI Courtney [name] presents for left nasal pain.  Discussed the use of AI scribe software for clinical note transcription with the patient, who gave verbal consent to proceed.  History of Present Illness Courtney [name] is a 74 year old female with hypertension who presents with left nostril pain.  She has been experiencing pain in her left nostril for 5 days, which she describes as tender. There is no nasal discharge apart from her usual drip, which she describes as being in a 'pretty color'.  No fever, chills, rash, swollen lymph nodes, chest pain, shortness of breath, dizziness, or lightheadedness.     Outpatient Medications Prior to Visit  Medication Sig Dispense Refill   amLODipine  (NORVASC ) 5 MG tablet Take 1 tablet (5 mg total) by mouth daily. 90 tablet 3   aspirin  81 MG tablet Take 81 mg by mouth daily.     Cholecalciferol (VITAMIN D3) 25 MCG (1000 UT) CAPS Take 1 capsule (1,000 Units total) by mouth daily.     cyanocobalamin  (VITAMIN B12) 1000 MCG tablet Take 1 tablet (1,000 mcg total) by mouth daily.     Evolocumab  (REPATHA  SURECLICK) 140 MG/ML SOAJ Inject 140 mg into the skin every 14 (fourteen) days. 6 mL 3   ezetimibe  (ZETIA ) 10 MG tablet Take 1 tablet (10 mg total) by mouth daily. 90 tablet 3   gabapentin  (NEURONTIN ) 300 MG capsule Take 1 capsule (300 mg total) by mouth at bedtime. 90 capsule 3   metoprolol  tartrate (LOPRESSOR ) 25 MG tablet TAKE 1 TABLET BY MOUTH TWICE A DAY 180 tablet 3   nitroGLYCERIN  (NITROSTAT ) 0.4 MG SL tablet Place 1 tablet (0.4 mg total) under the tongue every 5 (five) minutes as needed for chest pain. 25 tablet 11   ondansetron  (ZOFRAN ) 4 MG tablet Take 1 tablet (4 mg total) by mouth every 8 (eight) hours as needed for nausea or vomiting. 20 tablet 0   pantoprazole  (PROTONIX ) 40 MG  tablet TAKE 1 TABLET BY MOUTH EVERY DAY 90 tablet 3   ramipril  (ALTACE ) 10 MG capsule Take 1 capsule (10 mg total) by mouth daily. 90 capsule 3   rosuvastatin  (CRESTOR ) 40 MG tablet Take 1 tablet (40 mg total) by mouth daily. 90 tablet 3   No facility-administered medications prior to visit.    ROS Review of Systems  Constitutional:  Negative for appetite change, chills, diaphoresis, fatigue and fever.  HENT:  Negative for congestion, nosebleeds, rhinorrhea, sinus pressure, sore throat and trouble swallowing.   Eyes: Negative.   Respiratory:  Negative for cough, chest tightness and wheezing.   Cardiovascular:  Negative for chest pain, palpitations and leg swelling.  Gastrointestinal:  Negative for abdominal pain, constipation, diarrhea, nausea and vomiting.  Endocrine: Negative.   Genitourinary: Negative.  Negative for difficulty urinating.  Musculoskeletal: Negative.   Skin:  Negative for color change and rash.  Neurological:  Negative for dizziness and weakness.  Hematological:  Negative for adenopathy. Does not bruise/bleed easily.  Psychiatric/Behavioral: Negative.      Objective:  BP 134/68 (BP Location: Left Arm, Patient Position: Sitting, Cuff Size: Normal)   Pulse 60   Temp 97.7 F (36.5 C) (Oral)   Resp 16   Ht 5' (1.524 m)   Wt 164 lb (74.4 kg)   SpO2 96%  BMI 32.03 kg/m   BP Readings from Last 3 Encounters:  03/18/24 134/68  03/09/24 (!) 120/44  02/26/24 130/63    Wt Readings from Last 3 Encounters:  03/18/24 164 lb (74.4 kg)  03/09/24 164 lb 12.8 oz (74.8 kg)  01/28/24 166 lb 3.2 oz (75.4 kg)    Physical Exam Vitals reviewed.  Constitutional:      Appearance: Normal appearance.  HENT:     Nose: Nasal deformity and nasal tenderness present. No signs of injury, congestion or rhinorrhea.     Right Nostril: No foreign body, epistaxis or occlusion.     Left Nostril: No foreign body, epistaxis or occlusion.     Right Sinus: No maxillary sinus tenderness  or frontal sinus tenderness.     Left Sinus: No maxillary sinus tenderness or frontal sinus tenderness.      Comments: Left lateral anterior nares is tender, erythematous, swollen. There are no skin lesions.    Mouth/Throat:     Mouth: Mucous membranes are moist.  Eyes:     General: No scleral icterus.    Conjunctiva/sclera: Conjunctivae normal.  Cardiovascular:     Rate and Rhythm: Normal rate and regular rhythm.     Heart sounds: Murmur heard.     Systolic murmur is present with a grade of 2/6.  Pulmonary:     Effort: Pulmonary effort is normal.     Breath sounds: No stridor. No wheezing, rhonchi or rales.  Abdominal:     General: Abdomen is flat. Bowel sounds are normal.     Tenderness: There is no abdominal tenderness. There is no guarding or rebound.     Hernia: No hernia is present.  Musculoskeletal:     Cervical back: Neck supple.     Right lower leg: No edema.     Left lower leg: No edema.  Lymphadenopathy:     Cervical: No cervical adenopathy.  Skin:    Findings: No rash.  Neurological:     General: No focal deficit present.     Mental Status: She is alert.  Psychiatric:        Mood and Affect: Mood normal.        Behavior: Behavior normal.     Lab Results  Component Value Date   WBC 6.7 09/11/2023   HGB 12.4 09/11/2023   HCT 37.7 09/11/2023   PLT 233 09/11/2023   GLUCOSE 98 03/08/2024   CHOL 126 03/08/2024   TRIG 89 03/08/2024   HDL 79 03/08/2024   LDLCALC 30 03/08/2024   ALT 13 03/08/2024   AST 18 03/08/2024   NA 142 03/08/2024   K 4.6 03/08/2024   CL 104 03/08/2024   CREATININE 0.96 03/08/2024   BUN 21 03/08/2024   CO2 23 03/08/2024   TSH 1.030 09/11/2023   INR 0.97 07/01/2014   HGBA1C 5.9 (H) 09/11/2023    MM 3D SCREENING MAMMOGRAM BILATERAL BREAST Result Date: 07/10/2023 CLINICAL DATA:  Screening. EXAM: DIGITAL SCREENING BILATERAL MAMMOGRAM WITH TOMOSYNTHESIS AND CAD TECHNIQUE: Bilateral screening digital craniocaudal and mediolateral  oblique mammograms were obtained. Bilateral screening digital breast tomosynthesis was performed. The images were evaluated with computer-aided detection. COMPARISON:  Previous exam(s). ACR Breast Density Category c: The breasts are heterogeneously dense, which may obscure small masses. FINDINGS: There are no findings suspicious for malignancy. IMPRESSION: No mammographic evidence of malignancy. A result letter of this screening mammogram will be mailed directly to the patient. RECOMMENDATION: Screening mammogram in one year. (Code:SM-B-01Y) BI-RADS CATEGORY  1: Negative. Electronically  Signed   By: Craig Farr [name]   On: 07/10/2023 16:35   Estimated Creatinine Clearance: 45.6 mL/min (by C-G formula based on SCr of 0.96 mg/dL).   Assessment & Plan:   Cellulitis of nasal tip- This concerning for MRSA. Will treat with bactrim  + rifampin . -     Sulfamethoxazole -Trimethoprim ; Take 1 tablet by mouth 2 (two) times daily for 7 days.  Dispense: 14 tablet; Refill: 0 -     rifAMPin ; Take 1 capsule (300 mg total) by mouth 2 (two) times daily for 7 days.  Dispense: 14 capsule; Refill: 0     Follow-up: Return if symptoms worsen or fail to improve.  Debby Molt, MD "

## 2024-03-23 ENCOUNTER — Ambulatory Visit: Admitting: Emergency Medicine

## 2024-04-12 ENCOUNTER — Ambulatory Visit: Admitting: Dermatology

## 2024-04-19 ENCOUNTER — Ambulatory Visit (INDEPENDENT_AMBULATORY_CARE_PROVIDER_SITE_OTHER): Admitting: Family Medicine

## 2024-05-17 ENCOUNTER — Ambulatory Visit: Admitting: Emergency Medicine

## 2024-08-12 ENCOUNTER — Ambulatory Visit: Admitting: Dermatology

## 2025-02-10 ENCOUNTER — Ambulatory Visit

## 2025-02-10 ENCOUNTER — Encounter: Admitting: Emergency Medicine
